# Patient Record
Sex: Male | Born: 1970 | Hispanic: No | Marital: Married | Smoking: Never smoker
Health system: Southern US, Community
[De-identification: ages and names within clinical notes are randomized; demographics above are authoritative.]

## PROBLEM LIST (undated history)

## (undated) DIAGNOSIS — L409 Psoriasis, unspecified: Secondary | ICD-10-CM

## (undated) DIAGNOSIS — E538 Deficiency of other specified B group vitamins: Secondary | ICD-10-CM

## (undated) DIAGNOSIS — E669 Obesity, unspecified: Secondary | ICD-10-CM

## (undated) DIAGNOSIS — E785 Hyperlipidemia, unspecified: Secondary | ICD-10-CM

## (undated) DIAGNOSIS — I428 Other cardiomyopathies: Secondary | ICD-10-CM

## (undated) DIAGNOSIS — R195 Other fecal abnormalities: Secondary | ICD-10-CM

## (undated) DIAGNOSIS — I10 Essential (primary) hypertension: Secondary | ICD-10-CM

## (undated) DIAGNOSIS — D649 Anemia, unspecified: Secondary | ICD-10-CM

## (undated) DIAGNOSIS — M5126 Other intervertebral disc displacement, lumbar region: Secondary | ICD-10-CM

## (undated) DIAGNOSIS — Z789 Other specified health status: Secondary | ICD-10-CM

## (undated) DIAGNOSIS — D696 Thrombocytopenia, unspecified: Secondary | ICD-10-CM

## (undated) DIAGNOSIS — Z9289 Personal history of other medical treatment: Secondary | ICD-10-CM

## (undated) DIAGNOSIS — I953 Hypotension of hemodialysis: Secondary | ICD-10-CM

## (undated) DIAGNOSIS — T8610 Unspecified complication of kidney transplant: Secondary | ICD-10-CM

## (undated) DIAGNOSIS — Z992 Dependence on renal dialysis: Secondary | ICD-10-CM

## (undated) DIAGNOSIS — N183 Chronic kidney disease, stage 3 unspecified: Secondary | ICD-10-CM

## (undated) DIAGNOSIS — N2581 Secondary hyperparathyroidism of renal origin: Secondary | ICD-10-CM

## (undated) DIAGNOSIS — N186 End stage renal disease: Secondary | ICD-10-CM

## (undated) DIAGNOSIS — Z9189 Other specified personal risk factors, not elsewhere classified: Secondary | ICD-10-CM

## (undated) HISTORY — PX: PHOTOCOAGULATION: SHX5303

## (undated) HISTORY — DX: Other intervertebral disc displacement, lumbar region: M51.26

## (undated) HISTORY — DX: Deficiency of other specified B group vitamins: E53.8

## (undated) HISTORY — PX: EYE SURGERY: SHX253

## (undated) HISTORY — DX: Other cardiomyopathies: I42.8

## (undated) HISTORY — DX: Essential (primary) hypertension: I10

## (undated) HISTORY — DX: Other specified personal risk factors, not elsewhere classified: Z91.89

## (undated) HISTORY — DX: Hypomagnesemia: E83.42

## (undated) HISTORY — DX: Other fecal abnormalities: R19.5

## (undated) HISTORY — DX: Hypotension of hemodialysis: I95.3

## (undated) HISTORY — DX: Hypocalcemia: E83.51

## (undated) HISTORY — PX: CATARACT EXTRACTION, BILATERAL: SHX1313

## (undated) HISTORY — DX: Other specified health status: Z78.9

## (undated) HISTORY — DX: Thrombocytopenia, unspecified: D69.6

## (undated) HISTORY — DX: Hyperlipidemia, unspecified: E78.5

## (undated) HISTORY — DX: Chronic kidney disease, stage 3 (moderate): N18.3

## (undated) HISTORY — DX: Secondary hyperparathyroidism of renal origin: N25.81

## (undated) HISTORY — DX: Anemia, unspecified: D64.9

## (undated) HISTORY — DX: Chronic kidney disease, stage 3 unspecified: N18.30

## (undated) HISTORY — PX: AV FISTULA PLACEMENT: SHX1204

## (undated) HISTORY — DX: Dependence on renal dialysis: Z99.2

## (undated) HISTORY — DX: Unspecified complication of kidney transplant: T86.10

## (undated) HISTORY — DX: Obesity, unspecified: E66.9

## (undated) HISTORY — PX: LUMBAR DISC SURGERY: SHX700

## (undated) HISTORY — DX: End stage renal disease: N18.6

---

## 2007-09-27 ENCOUNTER — Emergency Department (HOSPITAL_COMMUNITY): Admission: EM | Admit: 2007-09-27 | Discharge: 2007-09-27 | Payer: Self-pay | Admitting: Emergency Medicine

## 2008-08-19 LAB — HM DIABETES FOOT EXAM

## 2008-12-23 ENCOUNTER — Inpatient Hospital Stay (HOSPITAL_COMMUNITY): Admission: EM | Admit: 2008-12-23 | Discharge: 2009-01-06 | Payer: Self-pay | Admitting: Emergency Medicine

## 2008-12-23 ENCOUNTER — Encounter (INDEPENDENT_AMBULATORY_CARE_PROVIDER_SITE_OTHER): Payer: Self-pay | Admitting: *Deleted

## 2008-12-23 ENCOUNTER — Ambulatory Visit: Payer: Self-pay | Admitting: Cardiology

## 2008-12-24 ENCOUNTER — Ambulatory Visit: Payer: Self-pay | Admitting: Vascular Surgery

## 2008-12-25 ENCOUNTER — Encounter: Payer: Self-pay | Admitting: Internal Medicine

## 2009-01-07 ENCOUNTER — Telehealth: Payer: Self-pay | Admitting: Internal Medicine

## 2009-01-08 ENCOUNTER — Encounter: Payer: Self-pay | Admitting: Internal Medicine

## 2009-01-15 ENCOUNTER — Ambulatory Visit: Payer: Self-pay | Admitting: Internal Medicine

## 2009-01-15 ENCOUNTER — Encounter: Payer: Self-pay | Admitting: Internal Medicine

## 2009-01-15 DIAGNOSIS — R110 Nausea: Secondary | ICD-10-CM | POA: Insufficient documentation

## 2009-01-15 DIAGNOSIS — I10 Essential (primary) hypertension: Secondary | ICD-10-CM

## 2009-01-15 DIAGNOSIS — I420 Dilated cardiomyopathy: Secondary | ICD-10-CM

## 2009-01-15 DIAGNOSIS — N189 Chronic kidney disease, unspecified: Secondary | ICD-10-CM

## 2009-01-15 DIAGNOSIS — N2581 Secondary hyperparathyroidism of renal origin: Secondary | ICD-10-CM | POA: Insufficient documentation

## 2009-01-15 DIAGNOSIS — N186 End stage renal disease: Secondary | ICD-10-CM

## 2009-01-15 DIAGNOSIS — D638 Anemia in other chronic diseases classified elsewhere: Secondary | ICD-10-CM | POA: Insufficient documentation

## 2009-01-15 DIAGNOSIS — D631 Anemia in chronic kidney disease: Secondary | ICD-10-CM

## 2009-01-15 DIAGNOSIS — E119 Type 2 diabetes mellitus without complications: Secondary | ICD-10-CM | POA: Insufficient documentation

## 2009-01-23 ENCOUNTER — Encounter: Payer: Self-pay | Admitting: Internal Medicine

## 2009-01-27 ENCOUNTER — Encounter: Payer: Self-pay | Admitting: Internal Medicine

## 2009-01-27 DIAGNOSIS — R0602 Shortness of breath: Secondary | ICD-10-CM

## 2009-01-28 ENCOUNTER — Encounter (INDEPENDENT_AMBULATORY_CARE_PROVIDER_SITE_OTHER): Payer: Self-pay | Admitting: *Deleted

## 2009-01-30 ENCOUNTER — Ambulatory Visit: Payer: Self-pay | Admitting: Internal Medicine

## 2009-01-30 LAB — CONVERTED CEMR LAB
Blood Glucose, Fingerstick: 179
Hgb A1c MFr Bld: 6.5 %

## 2009-02-04 ENCOUNTER — Ambulatory Visit: Payer: Self-pay | Admitting: Internal Medicine

## 2009-02-04 ENCOUNTER — Ambulatory Visit: Payer: Self-pay | Admitting: Cardiology

## 2009-02-04 LAB — CONVERTED CEMR LAB: Blood Glucose, Home Monitor: 3 mg/dL

## 2009-02-05 ENCOUNTER — Telehealth (INDEPENDENT_AMBULATORY_CARE_PROVIDER_SITE_OTHER): Payer: Self-pay | Admitting: *Deleted

## 2009-03-04 ENCOUNTER — Ambulatory Visit: Payer: Self-pay | Admitting: Internal Medicine

## 2009-03-04 ENCOUNTER — Encounter (INDEPENDENT_AMBULATORY_CARE_PROVIDER_SITE_OTHER): Payer: Self-pay | Admitting: Internal Medicine

## 2009-03-04 LAB — CONVERTED CEMR LAB
Blood Glucose, Fingerstick: 303
HDL: 44 mg/dL (ref >39)
Total CHOL/HDL Ratio: 4.8
VLDL: 61 mg/dL — ABNORMAL HIGH (ref 0–40)

## 2009-03-11 ENCOUNTER — Ambulatory Visit: Payer: Self-pay | Admitting: Vascular Surgery

## 2009-03-18 ENCOUNTER — Encounter: Payer: Self-pay | Admitting: Internal Medicine

## 2009-03-18 LAB — HM DIABETES EYE EXAM

## 2009-03-20 ENCOUNTER — Encounter: Payer: Self-pay | Admitting: Internal Medicine

## 2009-03-20 ENCOUNTER — Telehealth: Payer: Self-pay | Admitting: Internal Medicine

## 2009-03-20 ENCOUNTER — Ambulatory Visit (HOSPITAL_COMMUNITY): Admission: RE | Admit: 2009-03-20 | Discharge: 2009-03-20 | Payer: Self-pay | Admitting: Surgery

## 2009-03-20 ENCOUNTER — Ambulatory Visit: Payer: Self-pay | Admitting: Surgery

## 2009-03-25 ENCOUNTER — Encounter: Payer: Self-pay | Admitting: Internal Medicine

## 2009-03-27 ENCOUNTER — Ambulatory Visit (HOSPITAL_COMMUNITY): Admission: RE | Admit: 2009-03-27 | Discharge: 2009-03-27 | Payer: Self-pay | Admitting: Surgery

## 2009-04-01 ENCOUNTER — Ambulatory Visit: Payer: Self-pay

## 2009-04-01 ENCOUNTER — Encounter: Payer: Self-pay | Admitting: Cardiology

## 2009-04-09 ENCOUNTER — Telehealth (INDEPENDENT_AMBULATORY_CARE_PROVIDER_SITE_OTHER): Payer: Self-pay | Admitting: *Deleted

## 2009-04-14 ENCOUNTER — Ambulatory Visit: Payer: Self-pay | Admitting: Surgery

## 2009-04-22 ENCOUNTER — Ambulatory Visit: Payer: Self-pay | Admitting: Surgery

## 2009-04-22 ENCOUNTER — Ambulatory Visit (HOSPITAL_COMMUNITY): Admission: RE | Admit: 2009-04-22 | Discharge: 2009-04-22 | Payer: Self-pay | Admitting: Surgery

## 2009-04-29 ENCOUNTER — Telehealth (INDEPENDENT_AMBULATORY_CARE_PROVIDER_SITE_OTHER): Payer: Self-pay | Admitting: *Deleted

## 2009-04-29 ENCOUNTER — Encounter (INDEPENDENT_AMBULATORY_CARE_PROVIDER_SITE_OTHER): Payer: Self-pay | Admitting: *Deleted

## 2009-05-15 ENCOUNTER — Encounter: Payer: Self-pay | Admitting: Internal Medicine

## 2009-05-19 ENCOUNTER — Encounter: Payer: Self-pay | Admitting: Internal Medicine

## 2009-05-19 ENCOUNTER — Ambulatory Visit: Payer: Self-pay | Admitting: Internal Medicine

## 2009-05-19 LAB — CONVERTED CEMR LAB: Hgb A1c MFr Bld: 7.2 %

## 2009-06-02 ENCOUNTER — Encounter: Payer: Self-pay | Admitting: Internal Medicine

## 2009-06-05 ENCOUNTER — Ambulatory Visit: Payer: Self-pay | Admitting: Vascular Surgery

## 2009-06-25 ENCOUNTER — Ambulatory Visit: Payer: Self-pay | Admitting: Cardiology

## 2009-06-25 ENCOUNTER — Encounter: Payer: Self-pay | Admitting: Internal Medicine

## 2009-07-14 ENCOUNTER — Encounter: Payer: Self-pay | Admitting: Internal Medicine

## 2009-07-16 ENCOUNTER — Telehealth: Payer: Self-pay | Admitting: Cardiology

## 2009-07-24 ENCOUNTER — Telehealth: Payer: Self-pay | Admitting: Internal Medicine

## 2009-07-24 ENCOUNTER — Telehealth (INDEPENDENT_AMBULATORY_CARE_PROVIDER_SITE_OTHER): Payer: Self-pay | Admitting: *Deleted

## 2009-08-19 ENCOUNTER — Encounter: Payer: Self-pay | Admitting: Internal Medicine

## 2009-08-26 ENCOUNTER — Ambulatory Visit (HOSPITAL_COMMUNITY): Admission: RE | Admit: 2009-08-26 | Discharge: 2009-08-26 | Payer: Self-pay | Admitting: Vascular Surgery

## 2009-08-26 ENCOUNTER — Ambulatory Visit: Payer: Self-pay | Admitting: Surgery

## 2010-02-09 ENCOUNTER — Ambulatory Visit: Payer: Self-pay | Admitting: Cardiology

## 2010-02-10 ENCOUNTER — Ambulatory Visit: Payer: Self-pay | Admitting: Internal Medicine

## 2010-03-12 ENCOUNTER — Ambulatory Visit: Payer: Self-pay | Admitting: Cardiology

## 2010-03-12 ENCOUNTER — Encounter: Payer: Self-pay | Admitting: Cardiology

## 2010-03-12 ENCOUNTER — Ambulatory Visit (HOSPITAL_COMMUNITY): Admission: RE | Admit: 2010-03-12 | Discharge: 2010-03-12 | Payer: Self-pay | Admitting: Cardiology

## 2010-03-12 ENCOUNTER — Ambulatory Visit: Payer: Self-pay

## 2010-04-28 ENCOUNTER — Telehealth: Payer: Self-pay | Admitting: Internal Medicine

## 2010-05-14 ENCOUNTER — Telehealth: Payer: Self-pay | Admitting: Cardiology

## 2010-05-28 ENCOUNTER — Telehealth: Payer: Self-pay | Admitting: Cardiology

## 2010-09-09 ENCOUNTER — Telehealth (INDEPENDENT_AMBULATORY_CARE_PROVIDER_SITE_OTHER): Payer: Self-pay | Admitting: *Deleted

## 2010-09-15 NOTE — Assessment & Plan Note (Signed)
Summary: f91m/dm   History of Present Illness: Pleasant male with newly diagnosed CHF in May of 2010. Echo  revealed an ejection fraction of 20-25%, mild mitral regurgitation, trivial aortic insufficiency and a small pericardial effusion. A cardiac catheterization was performed that revealed normal coronary arteries. A TSH, ferritin, HIV, and ACE level were normal. A rheumatoid factor and ANA were also normal. He was treated medically for nonischemic cardiomyopathy. Note he was also found to have new onset renal failure felt most likely due to hypertension and diabetes. He was ultimately initiated on dialysis. An echocardiogram was repeated on April 01, 2009 and showed an ejection fraction of 40-45%. I last saw him in Nov 2010. Since then he denies any dyspnea on exertion, orthopnea, PND, pedal edema, palpitations, syncope or chest pain.  Current Medications (verified): 1)  Oscal 500/200 D-3 500-200 Mg-Unit Tabs (Calcium-Vitamin D) .Marland Kitchen.. 1 Tab By Mouth Once Daily 2)  Renal Multivitamin/zinc  Tabs (Multiple Vitamin) .... Take 1 Tablet By Mouth Once A Day 3)  Promethazine Hcl 25 Mg Tabs (Promethazine Hcl) .... Take 1 Tablet By Mouth For Nausea During Your Dialysis 4)  Epogen 3000 Unit/ml Soln (Epoetin Alfa) .... 6000 Units Iv With Hd 5)  Aspirin 81 Mg Tbec (Aspirin) .... Take One Tablet By Mouth Daily 6)  Dulcolax Stool Softener 100 Mg Caps (Docusate Sodium) .... As Needed 7)  Lancets  Misc (Lancets) .... To Test Blood Sugar Three Times Daily 8)  Prodigy Blood Glucose Test  Strp (Glucose Blood) .... To Test Blood Sugar Three Times Daily Before Meals 9)  Glipizide 2.5 Mg Xr24h-Tab (Glipizide) .... Take 1 Tablet By Mouth Once A Day 10)  Vitamin C 500 Mg Tabs (Ascorbic Acid) .Marland Kitchen.. 1 Tab By Mouth Once Daily  Allergies: No Known Drug Allergies  Past History:  Past Medical History: Reviewed history from 05/19/2009 and no changes required. Non-ischemic cardiomyopathy      -EF 20-25% 12/23/08 >> EF  40-45% 03/2009      -No CAD per cath 01/02/09      -likely hypertensive CM           -TSH 2.263, RF -, ANA -, HIV -, ACE wnl, Hepatitis panel - End Stage Renal Disease      -Initiated HD via right perm cath 12/30/08 (MWF schedule at [facility] center)      -Left upper extremity A-V fistula by Dr. Oneida Alar 12/30/08      -Anemia:  Recieved Venofer 12/30/08           -Ferritin 179, % sat 12 (12/23/08)      -Secondary Hyperparathyroidism             -PTH 188 (12/23/08), PO4 5.4 (01/06/09), Ca 9.2c (01/06/09) DM 2      -Diagnosed 12/23/08      -A1c 8.3 12/23/08      -24 hour urine protein 2793 mg/day      -SPEP neg HTN      -resolved after initiating hemodialysis      -now with post HD hypotension Hypotension      -secondary to HD, coreg d/c'd  Past Surgical History: Reviewed history from 05/19/2009 and no changes required. Left wrist AVF (12/2008)  >>> L forearm AVF revised (04/2009)  Social History: Reviewed history from 01/27/2009 and no changes required.  Denies tobacco, illicits or alcohol.  Has been married   for 18 years.  Is committed to his marriage.  Has two children.  No   risky sexual behavior.  Review of Systems       no fevers or chills, productive cough, hemoptysis, dysphasia, odynophagia, melena, hematochezia, dysuria, hematuria, rash, seizure activity, orthopnea, PND, pedal edema, claudication. Remaining systems are negative.   Vital Signs:  Patient profile:   40 year old male Height:      65 inches Weight:      158 pounds BMI:     26.39 Pulse rate:   93 / minute Resp:     12 per minute BP sitting:   111 / 70  (left arm)  Vitals Entered By: Burnett Kanaris (February 09, 2010 4:17 PM)  Physical Exam  General:  Well-developed well-nourished in no acute distress.  Skin is warm and dry.  HEENT is normal.  Neck is supple. No thyromegaly.  Chest is clear to auscultation with normal expansion.  Cardiovascular exam is regular rate and rhythm.  Abdominal exam nontender  or distended. No masses palpated. Extremities show no edema. AV fistula left upper extremity. neuro grossly intact    EKG  Procedure date:  02/09/2010  Findings:      Normal sinus rhythm at a rate of 93. No ST changes.  Impression & Recommendations:  Problem # 1:  CARDIOMYOPATHY, DILATED (ICD-425.4) Continue aspirin. He had not been able to add a beta blocker or ACE inhibitor because of blood pressure issues particularly on dialysis. Plan repeat echocardiogram. His reduced LV function previously may have been related to untreated hypertension. His updated medication list for this problem includes:    Aspirin 81 Mg Tbec (Aspirin) .Marland Kitchen... Take one tablet by mouth daily  His updated medication list for this problem includes:    Aspirin 81 Mg Tbec (Aspirin) .Marland Kitchen... Take one tablet by mouth daily  Orders: Echocardiogram (Echo)  Problem # 2:  ESSENTIAL HYPERTENSION, BENIGN (ICD-401.1) Blood pressure now controlled on no medications now that dialysis has been initiated. His updated medication list for this problem includes:    Aspirin 81 Mg Tbec (Aspirin) .Marland Kitchen... Take one tablet by mouth daily  Problem # 3:  END STAGE RENAL DISEASE (ICD-585.6) per nephrology.  Problem # 4:  DIABETES MELLITUS, TYPE II, UNCONTROLLED, WITH COMPLICATIONS (A999333)  His updated medication list for this problem includes:    Aspirin 81 Mg Tbec (Aspirin) .Marland Kitchen... Take one tablet by mouth daily    Glipizide 2.5 Mg Xr24h-tab (Glipizide) .Marland Kitchen... Take 1 tablet by mouth once a day  Patient Instructions: 1)  Your physician recommends that you schedule a follow-up appointment in: ONE YEAR 2)  Your physician has requested that you have an echocardiogram.  Echocardiography is a painless test that uses sound waves to create images of your heart. It provides your doctor with information about the size and shape of your heart and how well your heart's chambers and valves are working.  This procedure takes approximately one  hour. There are no restrictions for this procedure.

## 2010-09-15 NOTE — Progress Notes (Signed)
Summary: re mailed rx  Phone Note Call from Patient   Reason for Call: Talk to Nurse Summary of Call: pt states he never recieved mailed rx for carvedilol-can he get another copy mailed to him? [phone] Initial call taken by: Lorenda Hatchet,  May 14, 2010 1:21 PM  Follow-up for Phone Call        spoke with pt wife, fax number to [facility] [phone]. coreg script faxed. Fredia Beets, RN  May 14, 2010 3:59 PM     Prescriptions: CARVEDILOL 3.125 MG TABS (CARVEDILOL) Take one tablet by mouth twice a day  #90 x 3   Entered by:   Fredia Beets, RN   Authorized by:   Colin Mulders, MD, [facility]   Signed by:   Fredia Beets, RN on 05/14/2010   Method used:   Print then Give to Patient   RxID:   [id number]

## 2010-09-15 NOTE — Miscellaneous (Signed)
Summary: Appointment Canceled  Appointment status changed to canceled by [employer] on 02/26/2010 11:58 AM.  Cancellation Comments --------------------- echo/425.4/echo/mcr/no prec. req/saf  Appointment Information ----------------------- Appt Type:  CARDIOLOGY ANCILLARY VISIT      Date:  Thursday, February 26, 2010      Time:  11:30 AM for 60 min   Urgency:  Routine   Made By:  Wynelle Cleveland  To Visit:  [id number]    Reason:  echo/425.4/echo/mcr/no prec. req/saf  Appt Comments ------------- -- 02/26/10 11:58: ([facility]) CANCELED -- echo/425.4/echo/mcr/no prec. req/saf -- 02/09/10 16:38: ([facility]) BOOKED -- Routine CARDIOLOGY ANCILLARY VISIT at 02/26/2010 11:30 AM for 60 min echo/425.4/echo/mcr/no prec. req/saf  Appended Document: Appointment Canceled    Clinical Lists Changes        Problem # 13:  CARDIOMYOPATHY, DILATED (ICD-425.4) Please refer to last office visit for further details. Pt had Echo ordered by Dr. Stanford Breed to reassess LVF but pt cancelled. Will route and defer to Dr. Stanford Breed to determine when to have it rescheduled for since it is to be done at [facility].  Complete Medication List: 1)  Oscal 500/200 D-3 500-200 Mg-unit Tabs (Calcium-vitamin d) .Marland Kitchen.. 1 tab by mouth once daily 2)  Renal Multivitamin/zinc Tabs (Multiple vitamin) .... Take 1 tablet by mouth once a day 3)  Promethazine Hcl 25 Mg Tabs (Promethazine hcl) .... Take 1 tablet by mouth for nausea during your dialysis 4)  Epogen 3000 Unit/ml Soln (Epoetin alfa) .... 6000 units iv with hd 5)  Aspirin 81 Mg Tbec (Aspirin) .... Take one tablet by mouth daily 6)  Dulcolax Stool Softener 100 Mg Caps (Docusate sodium) .... As needed 7)  Lancets Misc (Lancets) .... To test blood sugar three times daily 8)  Prodigy Blood Glucose Test Strp (Glucose blood) .... To test blood sugar three times daily before meals 9)  Glipizide 2.5 Mg Xr24h-tab (Glipizide) .... Take 1 tablet by mouth once a day 10)   Vitamin C 500 Mg Tabs (Ascorbic acid) .Marland Kitchen.. 1 tab by mouth once daily  Appended Document: Appointment Canceled reschedule echo

## 2010-09-15 NOTE — Progress Notes (Signed)
Summary: refill/ hla  Phone Note Refill Request Message from:  Fax from Pharmacy on April 28, 2010 4:33 PM  Refills Requested: Medication #1:  PRODIGY BLOOD GLUCOSE TEST  STRP to test blood sugar three times daily before meals [BMN]   Dosage confirmed as above?Dosage Confirmed   Last Refilled: 9/2 last visit , lab 02/10/10  Initial call taken by: Freddy Finner RN,  April 28, 2010 4:34 PM  Follow-up for Phone Call       Follow-up by: Larey Dresser MD,  April 28, 2010 4:55 PM    Prescriptions: PRODIGY BLOOD GLUCOSE TEST  STRP (GLUCOSE BLOOD) to test blood sugar three times daily before meals Brand medically necessary #100 x 11   Entered and Authorized by:   Larey Dresser MD   Signed by:   Larey Dresser MD on 04/28/2010   Method used:   Electronically to        [facility].* (retail)       [phone]       [place], [place]  [address]       Ph: [phone]       Fax: [phone]   RxID:   [id number]

## 2010-09-15 NOTE — Assessment & Plan Note (Signed)
Summary: EST-CK/FU/MEDS/CFB   Vital Signs:  Patient profile:   40 year old male Height:      65 inches (165.10 cm) Weight:      159.3 pounds (72.41 kg) BMI:     26.60 Temp:     97.2 degrees F (36.22 degrees C) oral Pulse rate:   84 / minute BP sitting:   112 / 66  (right arm)  Vitals Entered By: Hilda Blades Ditzler RN (February 10, 2010 11:14 AM) Is Patient Diabetic? Yes Did you bring your meter with you today? No Pain Assessment Patient in pain? no      Nutritional Status BMI of 25 - 29 = overweight Nutritional Status Detail appetite good CBG Result 185  Have you ever been in a relationship where you felt threatened, hurt or afraid?denies   Does patient need assistance? Functional Status Self care Ambulation Normal Comments Ck-up and refills on meds.   History of Present Illness: [name] is a 40 yo man with PMH as outlined in chart.  He is here for routine visit.  Doing very well.  Has been more than a few months since we last saw him.  He has no complaints and reports doing great.  Saw Dr. Stanford Breed recently, doing well, repeat echo next week.  HD via LUE AVF MWF per Dr. Mercy Moore.  Depression History:      The patient denies a depressed mood most of the day and a diminished interest in his usual daily activities.         Preventive Screening-Counseling & Management  Alcohol-Tobacco     Alcohol drinks/day: 0     Smoking Status: never  Current Medications (verified): 1)  Oscal 500/200 D-3 500-200 Mg-Unit Tabs (Calcium-Vitamin D) .Marland Kitchen.. 1 Tab By Mouth Once Daily 2)  Renal Multivitamin/zinc  Tabs (Multiple Vitamin) .... Take 1 Tablet By Mouth Once A Day 3)  Promethazine Hcl 25 Mg Tabs (Promethazine Hcl) .... Take 1 Tablet By Mouth For Nausea During Your Dialysis 4)  Epogen 3000 Unit/ml Soln (Epoetin Alfa) .... 6000 Units Iv With Hd 5)  Aspirin 81 Mg Tbec (Aspirin) .... Take One Tablet By Mouth Daily 6)  Dulcolax Stool Softener 100 Mg Caps (Docusate Sodium) .... As Needed 7)   Lancets  Misc (Lancets) .... To Test Blood Sugar Three Times Daily 8)  Prodigy Blood Glucose Test  Strp (Glucose Blood) .... To Test Blood Sugar Three Times Daily Before Meals 9)  Glipizide 2.5 Mg Xr24h-Tab (Glipizide) .... Take 1 Tablet By Mouth Once A Day 10)  Vitamin C 500 Mg Tabs (Ascorbic Acid) .Marland Kitchen.. 1 Tab By Mouth Once Daily  Allergies (verified): No Known Drug Allergies  Past History:  Family History: Last updated: 01/27/2009  Mother has a history of hypertension.  Father had   history of diabetes mellitus and end stage renal disease, died at the   age of 62.  His siblings have hypertension and diabetes.  No history of   sudden cardiac death or congestive heart failure or coronary artery   disease.      Social History: Last updated: 02/10/2010  Denies tobacco, illicits or alcohol.  Has been married   for 18 years.  Is committed to his marriage.  Has two children.  No   risky sexual behavior.   Trying to get back to school and wants to start working again.     Risk Factors: Smoking Status: never (02/10/2010)  Past Medical History: Non-ischemic cardiomyopathy      -EF 20-25% 12/23/08 >>  EF 40-45% 03/2009 >> EF ??? 7/?/11      -No CAD per cath 01/02/09      -likely hypertensive CM           -TSH 2.263, RF -, ANA -, HIV -, ACE wnl, Hepatitis panel - End Stage Renal Disease      -Initiated HD via right perm cath 12/30/08 (MWF schedule at [facility] center)      -Left upper extremity A-V fistula by Dr. Oneida Alar 12/30/08      -Anemia:  Recieved Venofer 12/30/08           -Ferritin 179, % sat 12 (12/23/08)      -Secondary Hyperparathyroidism             -PTH 188 (12/23/08), PO4 5.4 (01/06/09), Ca 9.2c (01/06/09) DM 2      -Diagnosed 12/23/08      -A1c 8.3 12/23/08      -24 hour urine protein 2793 mg/day      -SPEP neg      -proliferative BDR s/p photocoagulation 08/2009 done at [facility] and followed by [facility]. HTN      -resolved after initiating hemodialysis      -now with  post HD hypotension Hypotension      -secondary to HD, coreg d/c'd  Past Surgical History: Left wrist AVF (12/2008)  >>> L forearm AVF revised (04/2009) Photocoagulation for diabetic retinopathy 08/2009 Bilateral cataract removal 08/2009  Social History:  Denies tobacco, illicits or alcohol.  Has been married   for 18 years.  Is committed to his marriage.  Has two children.  No   risky sexual behavior.   Trying to get back to school and wants to start working again.     Review of Systems      See HPI  Physical Exam  General:  alert, well-developed, normal appearance, and cooperative to examination.   Eyes:  anicteric, s/p bilateral lens implantation.   Neck:  supple and no JVD.   Lungs:  normal respiratory effort, no accessory muscle use, normal breath sounds, no crackles, and no wheezes.   Heart:  normal rate, regular rhythm, no murmur, no gallop, no rub, and no JVD.   Abdomen:  normal bowel sounds.   Extremities:  no edema Neurologic:  alert & oriented X3, cranial nerves II-XII intact, strength normal in all extremities, and gait normal.   Psych:  Oriented X3, memory intact for recent and remote, and normally interactive.     Impression & Recommendations:  Problem # 1:  DIABETES MELLITUS, TYPE II, UNCONTROLLED, WITH COMPLICATIONS (A999333) A1c 6.9 c/w current regimen Doing quite well  His updated medication list for this problem includes:    Aspirin 81 Mg Tbec (Aspirin) .Marland Kitchen... Take one tablet by mouth daily    Glipizide 2.5 Mg Xr24h-tab (Glipizide) .Marland Kitchen... Take 1 tablet by mouth once a day  Orders: T- Capillary Blood Glucose [id number]) T-Hgb A1C (in-house) [id number]) T-Lipid Profile [id number])  Problem # 2:  DIABETIC  RETINOPATHY (ICD-250.50)  followed by Dr. Katy Fitch s/p bilateral cataract removal and photocoagulation for proliferative BDR at [facility]  His updated medication list for this problem includes:    Aspirin 81 Mg Tbec (Aspirin) .Marland Kitchen... Take one tablet by mouth  daily    Glipizide 2.5 Mg Xr24h-tab (Glipizide) .Marland Kitchen... Take 1 tablet by mouth once a day  Last Eye Exam: Proliferative diabetic retinopathy.    (04/04/2009) Reviewed HgBA1c results: 6.9 (02/10/2010)  7.2 (05/19/2009)  Problem # 3:  ESSENTIAL HYPERTENSION, BENIGN (  ICD-401.1) Well controlled without meds  BP today: 112/66 Prior BP: 111/70 (02/09/2010)  Labs Reviewed: Chol: 209 (03/04/2009)   HDL: 44 (03/04/2009)   LDL: 104 (03/04/2009)   TG: 306 (03/04/2009)  Problem # 4:  END STAGE RENAL DISEASE (ICD-585.6) Per Dr. Mercy Moore HD MWF via LUE AVF  Problem # 5:  SECONDARY HYPERPARATHYROIDISM (ICD-588.81) per Dr. Mercy Moore  Problem # 6:  ANEMIA OF RENAL FAILURE (ICD-285.21) per Dr. Mercy Moore  His updated medication list for this problem includes:    Epogen 3000 Unit/ml Soln (Epoetin alfa) .Marland KitchenMarland KitchenMarland KitchenMarland Kitchen 6000 units iv with hd  Problem # 7:  Preventive Health Care (ICD-V70.0) Flu vaccine in Fall Will also provide TdaP if not given previously at that time. Pneumovax 12/23/08 in hospital  Problem # 8:  CARDIOMYOPATHY, DILATED (ICD-425.4) Recently saw Dr. Stanford Breed Doing quite well Currently euvolemic and well perfused Prior echo showed improved EF 40-45% from 20%.   New echo next week Does not tolerate B-blocker or ACE-I due to hypotension with HD  Complete Medication List: 1)  Oscal 500/200 D-3 500-200 Mg-unit Tabs (Calcium-vitamin d) .Marland Kitchen.. 1 tab by mouth once daily 2)  Renal Multivitamin/zinc Tabs (Multiple vitamin) .... Take 1 tablet by mouth once a day 3)  Promethazine Hcl 25 Mg Tabs (Promethazine hcl) .... Take 1 tablet by mouth for nausea during your dialysis 4)  Epogen 3000 Unit/ml Soln (Epoetin alfa) .... 6000 units iv with hd 5)  Aspirin 81 Mg Tbec (Aspirin) .... Take one tablet by mouth daily 6)  Dulcolax Stool Softener 100 Mg Caps (Docusate sodium) .... As needed 7)  Lancets Misc (Lancets) .... To test blood sugar three times daily 8)  Prodigy Blood Glucose Test Strp (Glucose  blood) .... To test blood sugar three times daily before meals 9)  Glipizide 2.5 Mg Xr24h-tab (Glipizide) .... Take 1 tablet by mouth once a day 10)  Vitamin C 500 Mg Tabs (Ascorbic acid) .Marland Kitchen.. 1 tab by mouth once daily  Patient Instructions: 1)  Please schedule a follow-up appointment in 6 months. 2)  Doing great overall. 3)  Continue with your medications listed below. 4)  If you have any problems before your next appointment, call clinic. Prescriptions: GLIPIZIDE 2.5 MG XR24H-TAB (GLIPIZIDE) Take 1 tablet by mouth once a day  #30 x 6   Entered and Authorized by:   Alphia Moh MD   Signed by:   Alphia Moh MD on 02/10/2010   Method used:   Electronically to        [facility] (retail)       [phone]       [place], [place]  [address]       Ph: [phone]       Fax: [phone]   RxID:   [id number] GLIPIZIDE 2.5 MG XR24H-TAB (GLIPIZIDE) Take 1 tablet by mouth once a day  #30 x 0   Entered and Authorized by:   Alphia Moh MD   Signed by:   Alphia Moh MD on 02/10/2010   Method used:   Electronically to        [facility] (retail)       [phone]       [place], [place]  [address]       Ph: [phone]       Fax: [phone]   RxID:   [id number]  Process Orders Check Orders Results:  [facility]: Check successful Tests Sent for requisitioning (February 10, 2010 11:51 AM):     02/10/2010: [facility] -- T-Lipid Profile [phone] (signed)    Prevention & Chronic Care Immunizations   Influenza vaccine: Not documented   Influenza vaccine deferral: Not available  (02/10/2010)    Tetanus booster: Not documented   Td booster deferral: Deferred  (02/10/2010)    Pneumococcal vaccine: Not documented  Other Screening   Smoking status: never  (02/10/2010)  Diabetes Mellitus   HgbA1C: 6.9  (02/10/2010)    Eye  exam: Proliferative diabetic retinopathy.     (04/04/2009)    Foot exam: yes  (05/19/2009)   Foot exam action/deferral: Do today   High risk foot: Not documented   Foot care education: Not documented    Urine microalbumin/creatinine ratio: Not documented   Urine microalbumin action/deferral: Not indicated    Diabetes flowsheet reviewed?: Yes   Progress toward A1C goal: Improved  Lipids   Total Cholesterol: 209  (03/04/2009)   Lipid panel action/deferral: Lipid Panel ordered   LDL: 104  (03/04/2009)   LDL Direct: Not documented   HDL: 44  (03/04/2009)   Triglycerides: 306  (03/04/2009)  Hypertension   Last Blood Pressure: 112 / 66  (02/10/2010)   Serum creatinine: Not documented   Serum potassium Not documented    Hypertension flowsheet reviewed?: Yes   Progress toward BP goal: At goal  Self-Management Support :   Personal Goals (by the next clinic visit) :     Personal A1C goal: 6  (02/10/2010)     Personal blood pressure goal: 130/80  (02/10/2010)     Personal LDL goal: 100  (02/10/2010)    Patient will work on the following items until the next clinic visit to reach self-care goals:     Medications and monitoring: take my medicines every day, check my blood sugar, bring all of my medications to every visit, examine my feet every day  (02/10/2010)     Eating: drink diet soda or water instead of juice or soda, eat more vegetables, use fresh or frozen vegetables, eat foods that are low in salt, eat baked foods instead of fried foods, eat fruit for snacks and desserts, limit or avoid alcohol  (02/10/2010)     Activity: take a 30 minute walk every day  (02/10/2010)    Diabetes self-management support: Written self-care plan, Education handout, Resources for patients handout  (02/10/2010)   Diabetes care plan printed   Diabetes education handout printed    Hypertension self-management support: Written self-care plan, Education handout, Resources for patients handout   (02/10/2010)   Hypertension self-care plan printed.   Hypertension education handout printed      Resource handout printed.  Laboratory Results   Blood Tests   Date/Time Received: February 10, 2010 11:42 AM  Date/Time Reported: Lenoria Farrier  February 10, 2010 11:42 AM   HGBA1C: 6.9%   (Normal Range: Non-Diabetic - 3-6%   Control Diabetic - 6-8%) CBG Random:: 185mg /dL

## 2010-09-15 NOTE — Progress Notes (Signed)
Summary: question about medication  Phone Note Call from Patient Call back at Home Phone [phone] Call back at [phone]   Caller: Patient Summary of Call: question about medication Initial call taken by: Delsa Sale,  May 28, 2010 11:46 AM  Follow-up for Phone Call        left msg at [phone] for c/b Follow-up by: Joan Flores RN,  May 28, 2010 12:38 PM  Additional Follow-up for Phone Call Additional follow up Details #1::         PT WAS [place].PER PT RECIEVED IN MAIL YESTERDAY NOTHING FURTHER NEEDED AT THIS TIME. Additional Follow-up by: Devra Dopp, LPN,  October 18, 624THL 11:20 AM

## 2010-09-15 NOTE — Consult Note (Signed)
Summary: [facility]: Post Cataract  [facility]: Post Cataract   Imported By: Bonner Puna 09/17/2009 16:42:33  _____________________________________________________________________  External Attachment:    Type:   Image     Comment:   External Document  Appended Document: [facility]: Post Cataract s/p bilateral cataract surgery, doing well.

## 2010-09-17 NOTE — Progress Notes (Signed)
  Request received from [facility] sent to [facility]  September 09, 2010 8:31 AM

## 2010-09-18 DIAGNOSIS — N186 End stage renal disease: Secondary | ICD-10-CM

## 2010-10-14 ENCOUNTER — Encounter: Payer: Self-pay | Admitting: Internal Medicine

## 2010-10-20 ENCOUNTER — Ambulatory Visit (INDEPENDENT_AMBULATORY_CARE_PROVIDER_SITE_OTHER): Payer: Self-pay | Admitting: Internal Medicine

## 2010-10-20 ENCOUNTER — Ambulatory Visit: Payer: Self-pay | Admitting: Internal Medicine

## 2010-10-20 ENCOUNTER — Encounter: Payer: Self-pay | Admitting: Internal Medicine

## 2010-10-20 DIAGNOSIS — I10 Essential (primary) hypertension: Secondary | ICD-10-CM

## 2010-10-20 DIAGNOSIS — N186 End stage renal disease: Secondary | ICD-10-CM

## 2010-10-20 DIAGNOSIS — E119 Type 2 diabetes mellitus without complications: Secondary | ICD-10-CM

## 2010-10-20 DIAGNOSIS — E118 Type 2 diabetes mellitus with unspecified complications: Secondary | ICD-10-CM

## 2010-10-20 LAB — POCT GLYCOSYLATED HEMOGLOBIN (HGB A1C): Hemoglobin A1C: 7.5

## 2010-10-20 NOTE — Assessment & Plan Note (Signed)
ESRD on HD and has been f/u in [facility] for his labs and his HgB usually 10.

## 2010-10-20 NOTE — Assessment & Plan Note (Signed)
CBG well controlled and usually runs about 120s, never more than 200. Today's A1C 7.5. Will continue current glipizide and may increase if A1C up at next visit. Had eye exam about 3 months ago. Done foot exam today, normal.

## 2010-10-20 NOTE — Patient Instructions (Signed)
Please continue all your medications as instructed and keep your blood sugar at least below 200.

## 2010-10-20 NOTE — Assessment & Plan Note (Signed)
BP well controlled with coreg. Continue current meds.

## 2010-10-20 NOTE — Progress Notes (Signed)
  Subjective:    Patient ID: [name], male    DOB: 1971-05-02, 40 y.o.   MRN: [id number]  HPIPt is a 40 yo male with PMH of DM, ESRD on HD, NICM and HTN who came here for check A1C. His last A1C 6.9 on 01/2010 and has been on glipizide and his CBG usually runs 120s, never above 200 recently. He has no other c/o, including fever, SOB, or edema.     Review of Systems  Constitutional: Negative for fever, chills and activity change.  HENT: Negative for hearing loss, ear pain, congestion, facial swelling, sneezing and neck stiffness.   Eyes: Negative for redness and itching.  Respiratory: Negative for apnea, cough and wheezing.   Cardiovascular: Negative for chest pain, palpitations and leg swelling.  Gastrointestinal: Negative for nausea, vomiting, abdominal pain, diarrhea and abdominal distention.  Genitourinary: Negative for dysuria and urgency.  Neurological: Negative for dizziness and headaches.       Objective:   Physical Exam  Constitutional: He is oriented to person, place, and time. He appears well-developed and well-nourished.  HENT:  Head: Normocephalic and atraumatic.  Eyes: Conjunctivae and EOM are normal. Pupils are equal, round, and reactive to light.  Neck: Normal range of motion. Neck supple.  Cardiovascular: Normal rate, regular rhythm, normal heart sounds and intact distal pulses.   Pulmonary/Chest: Effort normal and breath sounds normal. He has no wheezes. He exhibits no tenderness.  Abdominal: Soft. Bowel sounds are normal. He exhibits no mass. There is no tenderness. There is no rebound.  Musculoskeletal: Normal range of motion.       Left arm AVF has thrill. No infection signs.   Neurological: He is alert and oriented to person, place, and time. He has normal reflexes.  Skin: No rash noted. No erythema. No pallor.          Assessment & Plan:

## 2010-11-03 ENCOUNTER — Other Ambulatory Visit (HOSPITAL_COMMUNITY): Payer: Self-pay | Admitting: Nephrology

## 2010-11-03 DIAGNOSIS — N186 End stage renal disease: Secondary | ICD-10-CM

## 2010-11-12 ENCOUNTER — Ambulatory Visit (HOSPITAL_COMMUNITY)
Admission: RE | Admit: 2010-11-12 | Discharge: 2010-11-12 | Disposition: A | Discharge status: Home / Self Care | Payer: Medicare Other | Source: Ambulatory Visit | Attending: Nephrology | Admitting: Nephrology

## 2010-11-12 ENCOUNTER — Other Ambulatory Visit (HOSPITAL_COMMUNITY): Payer: Self-pay | Admitting: Nephrology

## 2010-11-12 DIAGNOSIS — N186 End stage renal disease: Secondary | ICD-10-CM | POA: Insufficient documentation

## 2010-11-12 DIAGNOSIS — Y832 Surgical operation with anastomosis, bypass or graft as the cause of abnormal reaction of the patient, or of later complication, without mention of misadventure at the time of the procedure: Secondary | ICD-10-CM | POA: Insufficient documentation

## 2010-11-12 DIAGNOSIS — E119 Type 2 diabetes mellitus without complications: Secondary | ICD-10-CM | POA: Insufficient documentation

## 2010-11-12 DIAGNOSIS — I871 Compression of vein: Secondary | ICD-10-CM | POA: Insufficient documentation

## 2010-11-12 DIAGNOSIS — T82898A Other specified complication of vascular prosthetic devices, implants and grafts, initial encounter: Secondary | ICD-10-CM | POA: Insufficient documentation

## 2010-11-12 MED ORDER — IOHEXOL 300 MG/ML  SOLN
100.0000 mL | Freq: Once | INTRAMUSCULAR | Status: AC | PRN
  Administered 2010-11-12: 71 mL via INTRAVENOUS

## 2010-11-19 LAB — GLUCOSE, CAPILLARY: Glucose-Capillary: 367 mg/dL — ABNORMAL HIGH (ref 70–99)

## 2010-11-20 LAB — POCT I-STAT 4, (NA,K, GLUC, HGB,HCT)
HCT: 49 % (ref 39.0–52.0)
Sodium: 137 mEq/L (ref 135–145)

## 2010-11-20 LAB — GLUCOSE, CAPILLARY: Glucose-Capillary: 94 mg/dL (ref 70–99)

## 2010-11-21 LAB — POCT I-STAT, CHEM 8
Creatinine, Ser: 8.4 mg/dL — ABNORMAL HIGH (ref 0.4–1.5)
HCT: 44 % (ref 39.0–52.0)
Hemoglobin: 15 g/dL (ref 13.0–17.0)
Potassium: 3.5 mEq/L (ref 3.5–5.1)
Sodium: 138 mEq/L (ref 135–145)

## 2010-11-21 LAB — POCT I-STAT 4, (NA,K, GLUC, HGB,HCT)
Potassium: 4.2 mEq/L (ref 3.5–5.1)
Sodium: 138 mEq/L (ref 135–145)

## 2010-11-21 LAB — GLUCOSE, CAPILLARY: Glucose-Capillary: 149 mg/dL — ABNORMAL HIGH (ref 70–99)

## 2010-11-22 LAB — GLUCOSE, CAPILLARY: Glucose-Capillary: 303 mg/dL — ABNORMAL HIGH (ref 70–99)

## 2010-11-24 LAB — CBC
HCT: 30.1 % — ABNORMAL LOW (ref 39.0–52.0)
HCT: 31.5 % — ABNORMAL LOW (ref 39.0–52.0)
HCT: 35.2 % — ABNORMAL LOW (ref 39.0–52.0)
HCT: 35.3 % — ABNORMAL LOW (ref 39.0–52.0)
HCT: 36.4 % — ABNORMAL LOW (ref 39.0–52.0)
HCT: 31.1 % — ABNORMAL LOW (ref 39.0–52.0)
HCT: 31.5 % — ABNORMAL LOW (ref 39.0–52.0)
HCT: 37.8 % — ABNORMAL LOW (ref 39.0–52.0)
Hemoglobin: 10.6 g/dL — ABNORMAL LOW (ref 13.0–17.0)
Hemoglobin: 11 g/dL — ABNORMAL LOW (ref 13.0–17.0)
Hemoglobin: 11.7 g/dL — ABNORMAL LOW (ref 13.0–17.0)
Hemoglobin: 11.8 g/dL — ABNORMAL LOW (ref 13.0–17.0)
Hemoglobin: 12.2 g/dL — ABNORMAL LOW (ref 13.0–17.0)
Hemoglobin: 10.5 g/dL — ABNORMAL LOW (ref 13.0–17.0)
Hemoglobin: 10.6 g/dL — ABNORMAL LOW (ref 13.0–17.0)
Hemoglobin: 10.6 g/dL — ABNORMAL LOW (ref 13.0–17.0)
MCHC: 33.6 g/dL (ref 30.0–36.0)
MCHC: 34.4 g/dL (ref 30.0–36.0)
MCHC: 35 g/dL (ref 30.0–36.0)
MCHC: 33.4 g/dL (ref 30.0–36.0)
MCHC: 33.7 g/dL (ref 30.0–36.0)
MCHC: 34 g/dL (ref 30.0–36.0)
MCHC: 34 g/dL (ref 30.0–36.0)
MCV: 82.7 fL (ref 78.0–100.0)
MCV: 82.7 fL (ref 78.0–100.0)
MCV: 83.3 fL (ref 78.0–100.0)
MCV: 83.5 fL (ref 78.0–100.0)
MCV: 83.5 fL (ref 78.0–100.0)
MCV: 82.7 fL (ref 78.0–100.0)
MCV: 82.8 fL (ref 78.0–100.0)
MCV: 82.9 fL (ref 78.0–100.0)
MCV: 82.9 fL (ref 78.0–100.0)
Platelets: 105 10*3/uL — ABNORMAL LOW (ref 150–400)
Platelets: 91 10*3/uL — ABNORMAL LOW (ref 150–400)
Platelets: 96 10*3/uL — ABNORMAL LOW (ref 150–400)
Platelets: 99 10*3/uL — ABNORMAL LOW (ref 150–400)
Platelets: 72 10*3/uL — ABNORMAL LOW (ref 150–400)
Platelets: 87 10*3/uL — ABNORMAL LOW (ref 150–400)
RBC: 3.73 MIL/uL — ABNORMAL LOW (ref 4.22–5.81)
RBC: 3.81 MIL/uL — ABNORMAL LOW (ref 4.22–5.81)
RBC: 4.21 MIL/uL — ABNORMAL LOW (ref 4.22–5.81)
RBC: 3.75 MIL/uL — ABNORMAL LOW (ref 4.22–5.81)
RBC: 3.76 MIL/uL — ABNORMAL LOW (ref 4.22–5.81)
RDW: 14.3 % (ref 11.5–15.5)
RDW: 14.3 % (ref 11.5–15.5)
RDW: 14.4 % (ref 11.5–15.5)
RDW: 14 % (ref 11.5–15.5)
RDW: 14.2 % (ref 11.5–15.5)
RDW: 14.2 % (ref 11.5–15.5)
WBC: 10 10*3/uL (ref 4.0–10.5)
WBC: 10.9 10*3/uL — ABNORMAL HIGH (ref 4.0–10.5)
WBC: 15.8 10*3/uL — ABNORMAL HIGH (ref 4.0–10.5)
WBC: 6.1 10*3/uL (ref 4.0–10.5)
WBC: 7.6 10*3/uL (ref 4.0–10.5)

## 2010-11-24 LAB — PROTIME-INR
INR: 1 (ref 0.00–1.49)
Prothrombin Time: 13.9 seconds (ref 11.6–15.2)

## 2010-11-24 LAB — BASIC METABOLIC PANEL
BUN: 52 mg/dL — ABNORMAL HIGH (ref 6–23)
BUN: 53 mg/dL — ABNORMAL HIGH (ref 6–23)
CO2: 26 mEq/L (ref 19–32)
CO2: 30 mEq/L (ref 19–32)
Calcium: 8.8 mg/dL (ref 8.4–10.5)
Calcium: 6.2 mg/dL — CL (ref 8.4–10.5)
Chloride: 96 mEq/L (ref 96–112)
Chloride: 115 mEq/L — ABNORMAL HIGH (ref 96–112)
Creatinine, Ser: 5.99 mg/dL — ABNORMAL HIGH (ref 0.4–1.5)
Creatinine, Ser: 6.92 mg/dL — ABNORMAL HIGH (ref 0.4–1.5)
GFR calc Af Amer: 11 mL/min — ABNORMAL LOW (ref >60)
GFR calc Af Amer: 13 mL/min — ABNORMAL LOW (ref >60)
GFR calc non Af Amer: 9 mL/min — ABNORMAL LOW (ref >60)
Glucose, Bld: 291 mg/dL — ABNORMAL HIGH (ref 70–99)
Glucose, Bld: 204 mg/dL — ABNORMAL HIGH (ref 70–99)
Glucose, Bld: 206 mg/dL — ABNORMAL HIGH (ref 70–99)
Potassium: 3.7 mEq/L (ref 3.5–5.1)
Sodium: 135 mEq/L (ref 135–145)
Sodium: 142 mEq/L (ref 135–145)

## 2010-11-24 LAB — EPSTEIN-BARR VIRUS VCA, IGM: EBV VCA IgM: 0.03 {ISR}

## 2010-11-24 LAB — GLUCOSE, CAPILLARY
Glucose-Capillary: 101 mg/dL — ABNORMAL HIGH (ref 70–99)
Glucose-Capillary: 106 mg/dL — ABNORMAL HIGH (ref 70–99)
Glucose-Capillary: 113 mg/dL — ABNORMAL HIGH (ref 70–99)
Glucose-Capillary: 123 mg/dL — ABNORMAL HIGH (ref 70–99)
Glucose-Capillary: 158 mg/dL — ABNORMAL HIGH (ref 70–99)
Glucose-Capillary: 159 mg/dL — ABNORMAL HIGH (ref 70–99)
Glucose-Capillary: 200 mg/dL — ABNORMAL HIGH (ref 70–99)
Glucose-Capillary: 206 mg/dL — ABNORMAL HIGH (ref 70–99)
Glucose-Capillary: 218 mg/dL — ABNORMAL HIGH (ref 70–99)
Glucose-Capillary: 257 mg/dL — ABNORMAL HIGH (ref 70–99)
Glucose-Capillary: 60 mg/dL — ABNORMAL LOW (ref 70–99)
Glucose-Capillary: 72 mg/dL (ref 70–99)
Glucose-Capillary: 74 mg/dL (ref 70–99)
Glucose-Capillary: 83 mg/dL (ref 70–99)
Glucose-Capillary: 86 mg/dL (ref 70–99)
Glucose-Capillary: 88 mg/dL (ref 70–99)
Glucose-Capillary: 92 mg/dL (ref 70–99)
Glucose-Capillary: 109 mg/dL — ABNORMAL HIGH (ref 70–99)
Glucose-Capillary: 121 mg/dL — ABNORMAL HIGH (ref 70–99)
Glucose-Capillary: 140 mg/dL — ABNORMAL HIGH (ref 70–99)
Glucose-Capillary: 144 mg/dL — ABNORMAL HIGH (ref 70–99)
Glucose-Capillary: 148 mg/dL — ABNORMAL HIGH (ref 70–99)
Glucose-Capillary: 153 mg/dL — ABNORMAL HIGH (ref 70–99)
Glucose-Capillary: 159 mg/dL — ABNORMAL HIGH (ref 70–99)
Glucose-Capillary: 159 mg/dL — ABNORMAL HIGH (ref 70–99)
Glucose-Capillary: 210 mg/dL — ABNORMAL HIGH (ref 70–99)
Glucose-Capillary: 82 mg/dL (ref 70–99)

## 2010-11-24 LAB — RENAL FUNCTION PANEL
Albumin: 2.9 g/dL — ABNORMAL LOW (ref 3.5–5.2)
Albumin: 3.1 g/dL — ABNORMAL LOW (ref 3.5–5.2)
Albumin: 3.2 g/dL — ABNORMAL LOW (ref 3.5–5.2)
Albumin: 2.7 g/dL — ABNORMAL LOW (ref 3.5–5.2)
Albumin: 2.7 g/dL — ABNORMAL LOW (ref 3.5–5.2)
BUN: 43 mg/dL — ABNORMAL HIGH (ref 6–23)
BUN: 56 mg/dL — ABNORMAL HIGH (ref 6–23)
BUN: 69 mg/dL — ABNORMAL HIGH (ref 6–23)
BUN: 69 mg/dL — ABNORMAL HIGH (ref 6–23)
BUN: 73 mg/dL — ABNORMAL HIGH (ref 6–23)
BUN: 65 mg/dL — ABNORMAL HIGH (ref 6–23)
BUN: 68 mg/dL — ABNORMAL HIGH (ref 6–23)
CO2: 21 mEq/L (ref 19–32)
CO2: 21 mEq/L (ref 19–32)
CO2: 22 mEq/L (ref 19–32)
CO2: 25 mEq/L (ref 19–32)
CO2: 19 mEq/L (ref 19–32)
CO2: 19 mEq/L (ref 19–32)
CO2: 19 mEq/L (ref 19–32)
Calcium: 7.2 mg/dL — ABNORMAL LOW (ref 8.4–10.5)
Calcium: 7.5 mg/dL — ABNORMAL LOW (ref 8.4–10.5)
Calcium: 8.4 mg/dL (ref 8.4–10.5)
Calcium: 8.5 mg/dL (ref 8.4–10.5)
Calcium: 6.1 mg/dL — CL (ref 8.4–10.5)
Chloride: 104 mEq/L (ref 96–112)
Chloride: 105 mEq/L (ref 96–112)
Chloride: 99 mEq/L (ref 96–112)
Chloride: 104 mEq/L (ref 96–112)
Chloride: 107 mEq/L (ref 96–112)
Chloride: 107 mEq/L (ref 96–112)
Creatinine, Ser: 10.48 mg/dL — ABNORMAL HIGH (ref 0.4–1.5)
Creatinine, Ser: 10.62 mg/dL — ABNORMAL HIGH (ref 0.4–1.5)
Creatinine, Ser: 9.03 mg/dL — ABNORMAL HIGH (ref 0.4–1.5)
Creatinine, Ser: 9.02 mg/dL — ABNORMAL HIGH (ref 0.4–1.5)
Creatinine, Ser: 9.13 mg/dL — ABNORMAL HIGH (ref 0.4–1.5)
GFR calc Af Amer: 7 mL/min — ABNORMAL LOW (ref >60)
GFR calc Af Amer: 8 mL/min — ABNORMAL LOW (ref >60)
GFR calc Af Amer: 8 mL/min — ABNORMAL LOW (ref >60)
GFR calc Af Amer: 8 mL/min — ABNORMAL LOW (ref >60)
GFR calc non Af Amer: 6 mL/min — ABNORMAL LOW (ref >60)
GFR calc non Af Amer: 7 mL/min — ABNORMAL LOW (ref >60)
GFR calc non Af Amer: 7 mL/min — ABNORMAL LOW (ref >60)
GFR calc non Af Amer: 7 mL/min — ABNORMAL LOW (ref >60)
Glucose, Bld: 104 mg/dL — ABNORMAL HIGH (ref 70–99)
Glucose, Bld: 70 mg/dL (ref 70–99)
Glucose, Bld: 99 mg/dL (ref 70–99)
Glucose, Bld: 96 mg/dL (ref 70–99)
Phosphorus: 5.4 mg/dL — ABNORMAL HIGH (ref 2.3–4.6)
Phosphorus: 7.5 mg/dL — ABNORMAL HIGH (ref 2.3–4.6)
Phosphorus: 7.8 mg/dL — ABNORMAL HIGH (ref 2.3–4.6)
Phosphorus: 7 mg/dL — ABNORMAL HIGH (ref 2.3–4.6)
Potassium: 4.1 mEq/L (ref 3.5–5.1)
Potassium: 4.1 mEq/L (ref 3.5–5.1)
Potassium: 4.4 mEq/L (ref 3.5–5.1)
Potassium: 3.7 mEq/L (ref 3.5–5.1)
Sodium: 135 mEq/L (ref 135–145)
Sodium: 138 mEq/L (ref 135–145)
Sodium: 143 mEq/L (ref 135–145)
Sodium: 139 mEq/L (ref 135–145)

## 2010-11-24 LAB — HEMOGLOBINOPATHY EVALUATION: Hgb F Quant: 0 % (ref 0.0–2.0)

## 2010-11-24 LAB — URINALYSIS, ROUTINE W REFLEX MICROSCOPIC
Glucose, UA: NEGATIVE mg/dL
Leukocytes, UA: NEGATIVE
Specific Gravity, Urine: 1.015 (ref 1.005–1.030)

## 2010-11-24 LAB — IRON AND TIBC
Iron: 33 ug/dL — ABNORMAL LOW (ref 42–135)
Saturation Ratios: 12 % — ABNORMAL LOW (ref 20–55)
TIBC: 275 ug/dL (ref 215–435)

## 2010-11-24 LAB — DRUGS OF ABUSE SCREEN W/O ALC, ROUTINE URINE
Amphetamine Screen, Ur: NEGATIVE
Benzodiazepines.: NEGATIVE
Opiate Screen, Urine: NEGATIVE
Phencyclidine (PCP): NEGATIVE
Propoxyphene: NEGATIVE

## 2010-11-24 LAB — URINE MICROSCOPIC-ADD ON

## 2010-11-24 LAB — HEPATITIS PANEL, ACUTE
HCV Ab: NEGATIVE
Hep A IgM: NEGATIVE
Hep B C IgM: NEGATIVE

## 2010-11-24 LAB — COMPREHENSIVE METABOLIC PANEL
ALT: 18 U/L (ref 0–53)
ALT: 27 U/L (ref 0–53)
AST: 13 U/L (ref 0–37)
AST: 15 U/L (ref 0–37)
CO2: 17 mEq/L — ABNORMAL LOW (ref 19–32)
CO2: 18 mEq/L — ABNORMAL LOW (ref 19–32)
Calcium: 5.9 mg/dL — CL (ref 8.4–10.5)
Chloride: 112 mEq/L (ref 96–112)
Chloride: 115 mEq/L — ABNORMAL HIGH (ref 96–112)
GFR calc Af Amer: 10 mL/min — ABNORMAL LOW (ref >60)
GFR calc Af Amer: 12 mL/min — ABNORMAL LOW (ref >60)
GFR calc non Af Amer: 10 mL/min — ABNORMAL LOW (ref >60)
GFR calc non Af Amer: 8 mL/min — ABNORMAL LOW (ref >60)
Glucose, Bld: 114 mg/dL — ABNORMAL HIGH (ref 70–99)
Sodium: 139 mEq/L (ref 135–145)
Sodium: 140 mEq/L (ref 135–145)
Total Bilirubin: 0.5 mg/dL (ref 0.3–1.2)
Total Bilirubin: 0.6 mg/dL (ref 0.3–1.2)

## 2010-11-24 LAB — TROPONIN I
Troponin I: 0.03 ng/mL (ref 0.00–0.06)
Troponin I: 0.03 ng/mL (ref 0.00–0.06)

## 2010-11-24 LAB — CALCIUM, IONIZED: Calcium, Ion: 0.83 mmol/L — ABNORMAL LOW (ref 1.12–1.32)

## 2010-11-24 LAB — POCT I-STAT 4, (NA,K, GLUC, HGB,HCT)
HCT: 57 % — ABNORMAL HIGH (ref 39.0–52.0)
Hemoglobin: 19.4 g/dL — ABNORMAL HIGH (ref 13.0–17.0)
Potassium: 4.5 mEq/L (ref 3.5–5.1)
Sodium: 134 mEq/L — ABNORMAL LOW (ref 135–145)

## 2010-11-24 LAB — URINALYSIS, MICROSCOPIC ONLY
Bilirubin Urine: NEGATIVE
Ketones, ur: NEGATIVE mg/dL
Nitrite: NEGATIVE
pH: 5 (ref 5.0–8.0)

## 2010-11-24 LAB — PROTEIN ELECTROPH W RFLX QUANT IMMUNOGLOBULINS
Beta 2: 5.3 % (ref 3.2–6.5)
Beta Globulin: 6 % (ref 4.7–7.2)
Gamma Globulin: 15.2 % (ref 11.1–18.8)
M-Spike, %: NOT DETECTED g/dL

## 2010-11-24 LAB — FERRITIN: Ferritin: 179 ng/mL (ref 22–322)

## 2010-11-24 LAB — DIFFERENTIAL
Basophils Absolute: 0.1 10*3/uL (ref 0.0–0.1)
Basophils Absolute: 0.1 10*3/uL (ref 0.0–0.1)
Basophils Relative: 1 % (ref 0–1)
Basophils Relative: 1 % (ref 0–1)
Eosinophils Absolute: 0.3 10*3/uL (ref 0.0–0.7)
Eosinophils Absolute: 0.5 10*3/uL (ref 0.0–0.7)
Eosinophils Absolute: 0.5 10*3/uL (ref 0.0–0.7)
Eosinophils Relative: 4 % (ref 0–5)
Eosinophils Relative: 7 % — ABNORMAL HIGH (ref 0–5)
Lymphs Abs: 2.1 10*3/uL (ref 0.7–4.0)
Lymphs Abs: 2.6 10*3/uL (ref 0.7–4.0)
Monocytes Relative: 10 % (ref 3–12)
Neutrophils Relative %: 48 % (ref 43–77)
Neutrophils Relative %: 49 % (ref 43–77)

## 2010-11-24 LAB — POCT CARDIAC MARKERS: CKMB, poc: 3 ng/mL (ref 1.0–8.0)

## 2010-11-24 LAB — POCT I-STAT, CHEM 8
Creatinine, Ser: 7.1 mg/dL — ABNORMAL HIGH (ref 0.4–1.5)
HCT: 41 % (ref 39.0–52.0)
Hemoglobin: 13.9 g/dL (ref 13.0–17.0)
Sodium: 142 mEq/L (ref 135–145)
TCO2: 19 mmol/L (ref 0–100)

## 2010-11-24 LAB — HIV ANTIBODY (ROUTINE TESTING W REFLEX): HIV: NONREACTIVE

## 2010-11-24 LAB — LIPID PANEL
Cholesterol: 198 mg/dL (ref 0–200)
HDL: 23 mg/dL — ABNORMAL LOW (ref >39)
LDL Cholesterol: 139 mg/dL — ABNORMAL HIGH (ref 0–99)
Total CHOL/HDL Ratio: 8.6 RATIO

## 2010-11-24 LAB — ECHOVIRUS ABS PANEL (CSF)
Echovirus Ab Type 11: <1:10 {titer}
Echovirus Ab Type 30: <1:10 {titer}

## 2010-11-24 LAB — CREATININE, URINE, 24 HOUR
Creatinine, 24H Ur: 944 mg/d (ref 800–2000)
Urine Total Volume-UCRE24: 1900 mL

## 2010-11-24 LAB — PROTEIN, URINE, 24 HOUR: Protein, 24H Urine: 2793 mg/d — ABNORMAL HIGH (ref 50–100)

## 2010-11-24 LAB — BRAIN NATRIURETIC PEPTIDE: Pro B Natriuretic peptide (BNP): 483 pg/mL — ABNORMAL HIGH (ref 0.0–100.0)

## 2010-11-24 LAB — CMV ABS, IGG+IGM (CYTOMEGALOVIRUS)
CMV IgM: <8 AU/mL (ref <30.0)
Cytomegalovirus Ab-IgG: 3 U/mL — ABNORMAL HIGH (ref <0.4)

## 2010-11-24 LAB — PTH, INTACT AND CALCIUM
Calcium, Total (PTH): 5.6 mg/dL — CL (ref 8.4–10.5)
PTH: 188 pg/mL — ABNORMAL HIGH (ref 14.0–72.0)

## 2010-11-24 LAB — ANGIOTENSIN CONVERTING ENZYME: Angiotensin-Converting Enzyme: 31 U/L (ref 9–67)

## 2010-11-24 LAB — CK TOTAL AND CKMB (NOT AT ARMC)
Relative Index: 0.6 (ref 0.0–2.5)
Relative Index: 0.6 (ref 0.0–2.5)

## 2010-11-24 LAB — MAGNESIUM: Magnesium: 2.9 mg/dL — ABNORMAL HIGH (ref 1.5–2.5)

## 2010-11-24 LAB — RSV(RESPIRATORY SYNCYTIAL VIRUS) AB, BLOOD: RSV Ab, IgM: 0.08 IV

## 2010-11-24 LAB — ANA: Anti Nuclear Antibody(ANA): NEGATIVE

## 2010-12-29 NOTE — Op Note (Signed)
NAMEDAVARIAN, LOHSE               ACCOUNT NO.:  [id number]   MEDICAL RECORD NO.:  [id number]          PATIENT TYPE:  AMB   LOCATION:  SDS                          FACILITY:  [facility]   PHYSICIAN:  Theotis Burrow IV, MDDATE OF BIRTH:  21-Jan-1971   DATE OF PROCEDURE:  03/27/2009  DATE OF DISCHARGE:  03/27/2009                               OPERATIVE REPORT   PREOPERATIVE DIAGNOSIS:  Nonmaturing left wrist fistula.   POSTOPERATIVE DIAGNOSIS:  Nonmaturing left wrist fistula.   PROCEDURE PERFORMED:  Branch ligation of left wrist fistula.   ANESTHESIA:  MAC.   COMPLICATIONS:  None.   INDICATIONS:  This is a 40 year old gentleman with end-stage renal  disease, who has a left radiocephalic fistula that is not matured.  Fistulogram revealed 2 branches.  The largest of which is at the wrist.  He comes in for ligation.   PROCEDURE:  The patient was identified in the holding area and taken to  room 9.  He was placed supine on the table.  MAC anesthesia was  administered.  The patient was prepped and draped in standard sterile  fashion.  A time-out was called.  Ultrasound was used to identify the  branch.  Lidocaine 1% was used for local anesthesia.  A longitudinal  incision was made over the branch that was isolated sharply and  encircled with 3-0 silk ties, and then ligated.  Upon ligation of the  branch, there was improvement in the thrill within the fistula.  The  wound was then irrigated.  The tissue was closed in two layers with 3-0  Vicryl.  The patient was taken to the recovery room in stable condition.      Eldridge Abrahams, MD  Electronically Signed     V. Leia Alf, MD  Electronically Signed    VWB/MEDQ  D:  03/27/2009  T:  03/28/2009  Job:  [id number]

## 2010-12-29 NOTE — Assessment & Plan Note (Signed)
OFFICE VISIT   [name], [name]  DOB:  07-Dec-1970                                       06/05/2009  [id number]   I saw the patient in the office today for followup of his left upper arm  AV fistula.  He had a poorly functioning left forearm AV fistula and  therefore we felt the next best option was to attempt an upper arm  fistula.  His forearm fistula was ligated on 04/22/2009 and in the same  setting we placed an upper arm fistula.  He had a high bifurcation of  his brachial artery so the upper arm fistula was actually a  radiocephalic fistula.  He comes in for a 6 week followup visit.  He has  no specific complaint and he has been exercising the arm quite a bit and  he has had no arm pain or swelling.   PHYSICAL EXAMINATION:  Vital signs:  Blood pressure is 129/78, heart  rate is 110.  The fistula has an excellent thrill and bruit.  It appears  to be gradually enlarging.  He has palpable radial pulse.  His incision  has healed nicely.   Overall I am pleased with his progress and I think this hopefully will  provide adequate access for long-term dialysis.  Once we know the  fistula is working we can work on getting his catheter out.  I suspect  it will be at least another 6 weeks before the fistula could be  cannulated.   Judeth Cornfield. Scot Dock, M.D.  Electronically Signed   CSD/MEDQ  D:  06/05/2009  T:  06/06/2009  Job:  [id number]   cc:   [employer]

## 2010-12-29 NOTE — H&P (Signed)
[name], [name]               ACCOUNT NO.:  [id number]   MEDICAL RECORD NO.:  [id number]          PATIENT TYPE:  EMS   LOCATION:  MAJO                         FACILITY:  [facility]   PHYSICIAN:  Blima Rich, MD      DATE OF BIRTH:  1970-11-04   DATE OF ADMISSION:  12/23/2008  DATE OF DISCHARGE:                              HISTORY & PHYSICAL   ADMITTING SERVICE:  [facility].   CHIEF COMPLAINT:  Shortness of breath and cough times six weeks.   HISTORY OF PRESENT ILLNESS:  [name] is a pleasant 40 year old  African American male with no significant past medical  history/significant social history.  He presents with chief complaints  of cough x6 weeks.  He also complains that over this period of time he  has been getting progressively more short of breath with increasing  dyspnea on exertion.  Apparently, approximately six weeks ago he had an  upper respiratory tract infection (wife claims that she was infected  with rhinovirus and then the patient got infected as well).  He  recovered from that and then he developed this chronic cough.  He is  currently not taking any medications save over-the-counter Delsym as  well as has taken Nasonex but did not have any significant improvement.  The cough has now been chronic.  He seems to have no expectoration, and  he has no hemoptysis.  He claims that over a period of time, he has  noticed over the past four weeks that he has been getting more short of  breath inquiring additional pillows during sleep which increased to  three total (at baseline requiring one), and then subsequently, as over  the past three days, he has been sleeping upright in a chair.  He also  complains that when he falls asleep he is suddenly aroused, having a  sensation of being suffocated.  He sits up, leans by the side of the  bed, gathers his breath, and then once he feels better, will try to fall  back asleep and then is awakened  multiple times with similar sensations.  He denies any chest pain, palpitations.  He denies any lower extremity  edema.  Does complain of increased satiety.  He claims that preceding  these past four weeks or so, he had no limitations in performing his  activities of daily living.  Now, he claims just walking from the room  to the elevator will get him short of breath, or walking from the  supermarket to his car carrying a bag of groceries will get him short of  breath requiring him to stop to recovery.  During my interview, the  patient was noticeably short of breath, though not gasping during  routine conversations.   The patient denies syncope, presyncope.  No lightheadedness.  No  blurring of vision.  No odynophagia, no dysphagia.  No tinnitus.  No  recent sick contacts except his wife approximately preceding 6 weeks  ago.  Denies expectoration.  Positive shortness of breath, dyspnea on  exertion, PND and orthopnea.  No chest pain or palpitations.  Positive  early satiety.  No abdominal distention.  No abdominal pain, nausea,  vomiting, diarrhea, constipation, dysuria, polyuria, hematuria, bright  red blood per rectum or melanotic stools.  He denies any overt  musculoskeletal complaints.   Upon arrival, the patient was noted to have a creatinine initially  measuring at 7.1, and a recheck demonstrating a creatinine of 6.5.  Also  of note, chest x-ray demonstrated bilateral pleural effusions at  interstitial edema.  Although, cardiac silhouettes is also enlarged  demonstrating cardiomegaly.  A CT angiogram was performed in the  emergency department demonstrating interstitial prominence with airway  thickening and distinct marked mildly enlarged mediastinal lymph nodes  with suggestion of congestive heart failure with bilateral pleural  effusions and pericardial effusion.  He has received Mucomyst  postprocedure in regards to his renal function.   PAST MEDICAL/SURGICAL HISTORY:   None.   SOCIAL HISTORY:  Denies tobacco, illicits or alcohol.  Has been married  for 18 years.  Is committed to his marriage.  Has two children.  No  risky sexual behavior.   FAMILY HISTORY:  Mother has a history of hypertension.  Father had  history of diabetes mellitus and end stage renal disease, died at the  age of 73.  His siblings have hypertension and diabetes.  No history of  sudden cardiac death or congestive heart failure or coronary artery  disease.   ALLERGIES:  No known drug allergies.   HOME MEDICATIONS:  None.   REVIEW OF SYSTEMS:  A 14-point review of systems performed.  Pertinent  positives negative as described above.   PHYSICAL EXAMINATION:  VITAL SIGNS:  At time of presentation,  temperature 98.6, heart rate 110, blood pressure 151/100, respirations  18, O2 saturation 995 on room air.  GENERAL:  Well-developed, well-nourished African American male sitting  up in bed comfortably in no acute distress.  HEENT:  Normocephalic, atraumatic.  Moist oral mucosa.  No thrush,  erythema or post nasal drip.  Eyes:  Anicteric.  Extraocular movements  intact.  Pupils are equal and reactive to light and accommodation.  NECK:  Supple.  Good range of motion.  No thyromegaly.  Right atrial  pressure approximately 12-15 cm of H2O.  No carotid bruits.  CARDIOVASCULAR:  S1, S2 normal.  Tachycardic.  No murmurs, rubs or  gallops appreciated.  The patient has both an S3 and an S4.  RESPIRATORY:  Air entry bilaterally decreased bases with crackles in the  bases up to the mid lung zones.  No rhonchi or wheezes appreciated.  ABDOMEN:  Soft, nontender, nondistended.  Positive mild hepatomegaly.  Approximately 1 cm.  No splenomegaly.  Positive bowel sounds.  EXTREMITIES:  No cyanosis, clubbing or edema.  Positive bilateral  dorsalis pedis.  CNS:  Alert and oriented x3.  Cranial nerves II-XII grossly intact.  Power, sensation, reflex bilaterally symmetrical.  SKIN:  No breakdown,  swelling, ulcerations or masses.  HEME/ONC:  No palpable lymphadenopathy, ecchymosis, bruising or  petechiae.   STUDIES:  Chest x-ray reviewed by self demonstrates cardiomegaly,  bilateral pleural effusions with interstitial edema.  CT angiogram of  the chest as described above.  EKG read by self demonstrates decreased  borderline voltage approximately 5 mm in a healthy male who is not obese  with Q wave inversions in the lateral leads and left anterior fascicular  block.   LABORATORY DATA:  Sodium 140, potassium 3.7, chloride 115, CO2 17,  glucose 206, BUN 52, creatinine 6.5, calcium 5.7.  BNP 649.  CBC  demonstrates WBC 8.8, hemoglobin 12.6, hematocrit 37.8, platelet count  87, polymorphs 58.  Troponin I less than 0.05, myoglobin greater than  500, CK MB 3.0.   ASSESSMENT/PLAN:  1. Congestive heart failure.  Though we do not have a documented left      ventricular systolic function/echo, I presume based on his      examination and his symptoms, the patient has congestive heart      failure.  His chest x-ray would corroborate this.  At this time,      give 40 mg of IV Lasix, strict I's and O's, daily weights and      restrict fluid intake to 1.5 liters per day.  Also, schedule serial      cardiac enzymes q.6 h apart times a total of three sets.  It is      unlikely that the patient has ischemic heart disease.  More likely      than not, either he has a viral process or metabolic derangement      which has resulted in depressed systolic function.  Therefore,      check CMB, EBV, RSV as well as influenza virologies.  Check      hepatitis profile, check HIV.  Also, check iron profile and check      serum and urine protein electrophoresis as well.  Obtain a 2D      echocardiogram to assess the left ventricular systolic function,      and the patient will require cardiology consult in the morning.      Also, check rheumatological disease factors including ESR, CRP and      rheumatoid  factor.  At this time, I do not feel that beta blockers      would be beneficial.  He is in active congestive heart failure.      Once he is stabilized and euvolemic, we can reinitiate beta      blockers.  In regards to ACE inhibitors, at this time, due to his      renal function, we will hold off.  Further assessments will be made      at a later time.  Start patient on aspirin 81 mg p.o. daily.  In      regards to statin, he will require one prior to starting.  Thorough      workup would be essential, and I would like to check a fasting      lipid profile as well.  2. Acute renal insufficiency.  The patient's baseline renal function      is unknown.  At this time, the patient has never been told that he      has renal insufficiency.  We will check ultrasound of bilateral      kidneys.  Also, urine protein electrophoresis.  Check urine      eosinophils.  I believe he will require 24-hour urine collection.      Though, nephrology is to see the patient and further assessments      and requirements can be discerned at that time.  3. Gastrointestinal/deep vein thrombosis prophylaxis.  Heparin 5000      units subcu q.8 and Protonix 40 mg p.o. daily.   At this time, the etiology of the patient's current condition is  unknown.  I believe that further workup including left heart  catheterization, possible right heart cath with biopsy as well as  evaluation for disease states such as amyloidosis and hemachromatosis is  not unreasonable.      Blima Rich, MD  Electronically Signed     SP/MEDQ  D:  12/23/2008  T:  12/23/2008  Job:  [id number]

## 2010-12-29 NOTE — Consult Note (Signed)
NAMEQI, GIGNAC               ACCOUNT NO.:  [id number]   MEDICAL RECORD NO.:  [id number]          PATIENT TYPE:  INP   LOCATION:  5524                         FACILITY:  [facility]   PHYSICIAN:  Denice Bors. Stanford Breed, MD, FACCDATE OF BIRTH:  1971/03/09   DATE OF CONSULTATION:  12/23/2008  DATE OF DISCHARGE:                                 CONSULTATION   PRIMARY CARE PHYSICIAN:  None.   PRIMARY CARDIOLOGIST:  Denice Bors. Stanford Breed, MD, [facility]   CHIEF COMPLAINT:  Shortness of breath.   HISTORY OF PRESENT ILLNESS:  [name] is a 40 year old male with no  history of coronary artery disease.  He had onset approximately on October 14, 2008 of nausea, vomiting, and diarrhea which lasted approximately 4  days.  He does think he had a fever, but did not check.  His wife was  ill and she was diagnosed with rhinovirus.  Approximately 1 week later,  he developed dry cough.  Approximately 2 weeks later, he developed  dyspnea on exertion.  Over the last 6 weeks, the dyspnea on exertion has  progressed to include shortness of breath with conversation, orthopnea ,  and PND over the last 3 days.  He has never had chest pain.  He denies  short of breath at rest, but even while lying still, he gets short of  breath with conversation.  He slept very little over the last 2-3  nights, and he came to the ER yesterday because he was tired of this  symptom.  His shortness of breath has improved somewhat with IV Lasix.   PAST MEDICAL HISTORY:  He had a DOT physical approximately 1 year ago  and was told that his blood pressure was a little high.  He has not had  recent medical care and does not know of any medical problems.  He was  seen in the emergency room for bronchitis in February 2009 and at that  time his blood pressure was 164/105.   PAST SURGICAL HISTORY:  None.   ALLERGIES:  No known drug allergies.   MEDICATIONS PRIOR TO ADMISSION:  None.   CURRENT MEDICATIONS:  1. Mucomyst 600 mg q.12 h.  2.  Aspirin 81 mg a day.  3. Lasix 40 mg IV daily.  4. Mucinex 600 mg b.i.d.  5. Heparin 5000 units every 8 hours.  6. Protonix 40 mg a day.   SOCIAL HISTORY:  He lives in [place] with his wife and works as a  Sports coach.  He denies alcohol, tobacco, or drug abuse.   FAMILY HISTORY:  His mother is alive in her 38s with a history of  hypertension.  His father died at age 58 with no history of coronary  artery disease and no siblings have heart disease.   REVIEW OF SYSTEMS:  He denies fevers, chills, or sweats.  He has not had  edema.  He had some tachy palpitations when he took Mucinex-D, but these  resolved once the medicine wore off.  He describes gradually decreasing  urine output and dark colored urine, but no dysuria.  He has occasional  arthralgias.  He denies reflux symptoms or melena.  He has been  coughing, but it is nonproductive, and there is no wheeze.  Full 14-  point review of systems is otherwise negative.   PHYSICAL EXAMINATION:  VITAL SIGNS:  Temperature is 98.4, blood pressure  157/114, pulse 102, respiratory rate 19, O2 saturation 96% on room air.  GENERAL:  He is a well-developed, well-nourished African-American male  in no acute distress at rest.  HEENT:  Essentially normal.  NECK:  There is no lymphadenopathy, thyromegaly, or bruits noted.  He  does have JVD at 8 cm.  CARDIOVASCULAR:  His heart is regular in rate and rhythm with an S1, S2,  and an S3 gallop is noted.  Distal pulses are intact in all 4  extremities.  No femoral bruits are appreciated.  LUNGS:  He has crackles bilaterally.  SKIN:  No rashes or lesions are noted.  ABDOMEN:  Soft and nontender with active bowel sounds.  EXTREMITIES:  There is no cyanosis, clubbing, or edema noted.  MUSCULOSKELETAL:  There is no joint deformity or effusions, and no spine  or CVA tenderness.  NEUROLOGIC:  He is alert and oriented.  Cranial nerves II through XII  grossly intact.   CT angiogram of the chest shows  interstitial prominence question  interstitial edema for his CHF as well as bilateral effusions and a  small pericardial effusion.   EKG; sinus rhythm rate 99 with no acute ischemic changes; however, he  has lateral inverted T waves, possible LVH.   LABORATORY VALUES:  Hemoglobin 13.9, hematocrit 18.8, WBC is 41,  platelets 87, sodium 140, potassium 3.9, chloride 115, CO2 of 17, BUN  51, creatinine 6.51, glucose 206, BNP is 649.  CK-MB #1, 587/2.5, #2  595/3.8.  Troponin I 0.04 and 0.03, myoglobin on point-of-care marker is  greater than 500, TSH 2.263, total cholesterol 198, triglycerides 178,  HDL 23, LDL 139.   Echocardiogram preliminary results are EF of 15-20%.   IMPRESSION:  [name] was seen today at 2:15 p.m. by Dr. Stanford Breed.  He  is a 40 year old male with a past medical history of borderline  hypertension who we are asked to evaluate for dyspnea and CHF symptoms.  He presents with a 6-week history of cough, dyspnea on exertion,  orthopnea, and PND.  He has no pedal edema and only one brief episode of  chest pain 2 days ago, duration approximately 10 minutes and described  as a cramping pain, but not pleuritic.  He has a recent GI viral illness  in the family.  His EKG is sinus tachycardia with prolonged QT and  lateral T-wave inversions.  CT angiogram showed CHF, cardiac  enlargement, pericardial effusion, and bilateral pleural effusions.  BUN  and creatinine are significantly elevated at 51 and 6.51 and BNP is  elevated as well at 649.   Since his LV function is low, he will be treated with hydralazine,  nitrates, and beta blockers, but no ACE inhibitors secondary to renal  insufficiency.  He will need a workup for the cause of his left  ventricular function.  It is possibly secondary to hypertension as he  has no history EtOH abuse and a normal TSH.  It is questionably viral  cardiomyopathy with recent infection in the family.  Ischemia would need  to be excluded, but  with renal insufficiency no cath will be performed.  We will perform a Myoview at some point.  The ferritin needs to be  checked  as well.  Nephrology is seeing the patient and their input is  extremely important.      Rosaria Ferries, PA-C      Denice Bors. Stanford Breed, MD, [facility]  Electronically Signed    RB/MEDQ  D:  12/23/2008  T:  12/24/2008  Job:  [id number]

## 2010-12-29 NOTE — Op Note (Signed)
NAMEDEANDREW, SEANEY               ACCOUNT NO.:  [id number]   MEDICAL RECORD NO.:  [id number]          PATIENT TYPE:  AMB   LOCATION:  SDS                          FACILITY:  [facility]   PHYSICIAN:  Theotis Burrow IV, MDDATE OF BIRTH:  1971-05-07   DATE OF PROCEDURE:  03/20/2009  DATE OF DISCHARGE:  03/20/2009                               OPERATIVE REPORT   PREOPERATIVE DIAGNOSIS:  Nonmaturing left radiocephalic fistula.   POSTOPERATIVE DIAGNOSIS:  Nonmaturing left radiocephalic fistula.   PROCEDURE PERFORMED:  1. Ultrasound access, left radiocephalic fistula.  2. Fistulogram.   SURGEON:  1. Annamarie Major IV, MD   INDICATIONS:  This is a 40 year old gentleman who has undergone left  radiocephalic fistula.  He comes in today for fistulogram as this has  not matured.   PROCEDURE IN DETAIL:  The patient was identified in the holding area and  taken to room 8 and he was placed supine on the table.  The left arm was  prepped and draped in standard sterile fashion.  Time-out was called.  Lidocaine 1% was used for local anesthesia.  The left-sided fistula was  evaluated by ultrasound and found to be widely patent and easily  compressible.  It was accessed under ultrasound guidance with an 18-  gauge needle and 0.018 mandrel wire was advanced into the fistula under  fluoroscopic visualization.  A micropuncture sheath was placed.  Contrast injections of the venous side of fistula were performed through  the sheath.  Evaluation of the arterial anastomosis was difficult.  I  attempted to use manual compression as well as compression using a blood  pressure cuff.  The arterial anastomosis was not well identified.   FINDINGS:  There is a large dominant branch proximally 4 cm from the  arterial anastomosis which appears to be hemodynamically significant.  There is also a large branch at the antecubital area which gives rise to  the basilic venous system.  The cephalic vein is patent  throughout its  entire course.  There is no evidence of central venous stenosis.  Again,  the arterial anastomosis was not well evaluated due to the inability to  reflux contrast across it despite manual compression and with  compression from a blood pressure cuff.   IMPRESSION:  1. Arterial venous anastomosis, not adequately evaluated.  2. Large branch just proximal to the anastomosis which is a      hemodynamically significant.  3. The cephalic vein gives rise to the basilic vein in the arm crease.  4. No central stenosis.      Eldridge Abrahams, MD  Electronically Signed     VWB/MEDQ  D:  03/20/2009  T:  03/21/2009  Job:  [id number]

## 2010-12-29 NOTE — Cardiovascular Report (Signed)
NAMEHOLSTON, GARTMAN               ACCOUNT NO.:  [id number]   MEDICAL RECORD NO.:  [id number]          PATIENT TYPE:  INP   LOCATION:  6739                         FACILITY:  [facility]   PHYSICIAN:  Wallis Bamberg. [name], MD, FACCDATE OF BIRTH:  1970-10-21   DATE OF PROCEDURE:  01/02/2009  DATE OF DISCHARGE:                            CARDIAC CATHETERIZATION   A 40 year old patient with cardiomyopathy, rule out coronary artery  disease.   Cine catheterization was done with 5-French catheter from right femoral  artery.   Left main coronary artery was normal.   Left anterior descending artery was normal.  First and second diagonal  branches were fairly small and normal.  The LAD wrapped the apex.   The circumflex coronary artery was dominant.  There was a very large  first obtuse marginal branch, which was normal.  Second obtuse marginal  branch was normal.   The right coronary artery was small and nondominant.   We crossed the aortic valve for pressures.  LV pressure was 140/5.  Aortic pressure was 140/74.   IMPRESSION:  Nonischemic cardiomyopathy.  The patient has no evidence of  coronary artery disease despite the interpretation of his Myoview.  He  tolerated the procedure well.  He will continue with dialysis.      Wallis Bamberg. [name], MD, [facility]  Electronically Signed     PCN/MEDQ  D:  01/02/2009  T:  01/02/2009  Job:  [id number]

## 2010-12-29 NOTE — Discharge Summary (Signed)
[name], [name]               ACCOUNT NO.:  [id number]   MEDICAL RECORD NO.:  [id number]           PATIENT TYPE:   LOCATION:                                 FACILITY:   PHYSICIAN:  Neysa Bonito, MD  DATE OF BIRTH:  01-22-1971   DATE OF ADMISSION:  DATE OF DISCHARGE:                               DISCHARGE SUMMARY   CHIEF COMPLAINT:  Shortness of breath and cough for the last 6 weeks  prior to presentation.   BRIEF HISTORY OF PRESENT ILLNESS:  [name] is a very pleasant 40-year-  old African American male, admitted with a chief complaint of cough and  found to have renal failure.   HOSPITAL PROCEDURES:  1. Current lab test showing sodium 138, potassium 4.4, chloride 105,      bicarb is 22, glucose 172, creatinine 7.7.  Estimated GFR with      those number is 9.  White blood count is 10.9, hemoglobin is 11.8,      hematocrit is 35.2, and platelet count is 96.  2. Chest x-ray on Dec 22, 2008.  Impression is a small bilateral pleural effusion and left basilar  airspace disease, could be due to asymmetric edema.  Airspace disease in  the left lung could be due to pneumonia.  1. CT angiogram on Dec 22, 2008, the indication is not clear, but the      impression is:      a.     Interstitial prominence with airway thickening and       indistinct mildly enlarged mediastinal lymph nodes.  The       appearance and the congestion with the left ventricular dilatation       suggest interstitial edema and congestive heart failure.      b.     Bilateral pleural effusion and pericardial effusion.  Please       note that there is some dependent density along the left parietal       pleura, such that mass or exudative effusion cannot be confidently       excluded.  Please note that the patient inadvertently received contrast medium  despite a creatinine of 6.8.  I discussed this with Dr. Jola Schmidt by  the telephone after being notified, and I recommend a Nephrology  consultation.  1. Renal ultrasound on Dec 23, 2008.  Impression:  a.  There is no evidence of renal obstruction.  b.  Small hyperechoic kidney, consistent with medical renal disease.  c.  Small bilateral pleural effusion.  d.  Simple right upper pole renal cyst.  1. Nuclear medicine Myoview on Dec 26, 2008.  Impression:  a.  Finding worrisome for multivessel coronary artery disease.  b.  No convincing evidence of ischemia; however, marked left ventricular  enlargement.  Severe global hypokinesis.  c.  Estimated QGS ejection fraction is 21%.  1. Dec 30, 2008, the patient had chest x-ray.  Impression:  a.  Marked improvement in the left greater than right pleural effusion  and basilar airspace disease.  b.  Cardiomegaly.  1. Dec 30, 2008, chest  x-ray after internal jugular approach.  Impression is that internal jugular approach dialysis catheter with the  tip of the proximal lumen at the junction of the superior vena cava and  the right atrium and distal lumen deep with the right atrium.  No  pneumothorax.  Cardiomegaly and vascular congestion.  1. Fluoroscopy-guided central venous line, done by Dr. Oneida Alar.  2. A 2-D echo on Dec 23, 2008.  Impression:  Left ventricular cavity size was mildly dilated.  Systolic  function was severely reduced.  Estimated ejection fraction was in the  range of 20-25% with diffuse hypokinesis.   THE HOSPITAL CONSULTATIONS:  1. Richard F. Hassell Done, MD with Nephrology.  2. Denice Bors. Stanford Breed, MD, [facility] is Cardiology.  3. Dr. Cornelia Copa, Nephrology.  4. Vascular Surgery, Jessy Oto. Fields, MD   OPERATIVE REPORT:  The patient had ultrasound of the neck on Dec 30, 2008, with right internal jugular vein Diatek catheter.  Left  radiocephalic arteriovenous fistula.   DISCHARGE DIAGNOSES PLUS THE CURRENT MEDICAL PROBLEMS:  1. Cardiomyopathy with severely depressed ejection fraction as      estimated 21%.  2. Chronic kidney disease:  The patient just started on dialysis.  3.  Possible cardiac ischemia:  Workup is pending.   DISCUSSION AND HOSPITAL COURSE:  1. The patient was found with elevated creatinine as discussed.      Unfortunately, he also received intravenous contrast on the day of      admission, before I started attending to him.  The patient      obviously needed the hemodialysis, but he wanted sometime to make      up his mind.  Currently, the patient has a Diatek and AV      graft/shunt placed as discussed.  He started dialysis since      yesterday, and he will be continuing dialysis for sometime.  2. The patient found was to have cardiomyopathy and possibility of      ischemia.  Cardiology was following up through.  The plan is to do      left heart coronary angiogram to rule out the possibility of      ischemia or shed light in regards to his cardiomyopathy.  3. Hypertension, improved after congestive heart failure management:      Now, the patient has a stable blood pressure.   DISCHARGE MEDICATIONS:  This will be dictated on the day of actual  discharge.      Neysa Bonito, MD  Electronically Signed     EME/MEDQ  D:  12/31/2008  T:  01/01/2009  Job:  [id number]

## 2010-12-29 NOTE — Consult Note (Signed)
NAMEJASIRI, PUYEAR               ACCOUNT NO.:  [id number]   MEDICAL RECORD NO.:  [id number]          PATIENT TYPE:  INP   LOCATION:  5530                         FACILITY:  [facility]   PHYSICIAN:  Maudie Flakes. Hassell Done, M.D.   DATE OF BIRTH:  [birth year]   DATE OF CONSULTATION:  12/23/2008  DATE OF DISCHARGE:                                 CONSULTATION   PCP:  None.   REQUESTING PHYSICIAN:  Dr. Posey Pronto from [facility]:  Renal failure with inadvertent contrast  administration with creatinine of 7.1.   CHIEF COMPLAINT:  Shortness of breath and cough.   HISTORY OF PRESENT ILLNESS:  [name] is a 40 year old African  American right-handed man without any known past medical history who  presents to Emergency Department with 6-8 weeks of progressive dyspnea  and cough.  He and his wife reported a viral upper respiratory infection  approximately 6-8 weeks ago that resolved.  He continued with a dry  cough since that time.  Through this time, he has developed shortness of  breath with increased dyspnea on exertion, requiring him to stop after  one flight of stair to catch his breath, increased orthopnea, going from  previous 1 pillow to currently 3 pillows and paroxysmal nocturnal  dyspnea.  He reports some other symptoms including decreased appetite,  diarrhea, some nausea and vomiting, although these have waxed and waned  and currently not acute issue.  He has also had some subjective fevers.  His urine output has decreased and darken in color.  Nephrology has  currently consulted in light of significant renal failure and contrast  administration inadvertently with a creatinine of 7.1.   PAST MEDICAL HISTORY:  None known.   MEDICATIONS:  P.r.n. Delsym and Mucinex recently.   ALLERGIES:  No known drug allergies.   SOCIAL HISTORY:  He does janitorial work, he is married and born in  [place], [place].  He denies any tobacco, alcohol, or drug use.   FAMILY  HISTORY:  Positive for diabetes and hypertension.  He has 1  sister who is 45 year old on hemodialysis for end-stage renal disease  secondary to diabetes and hypertension.  Father also passed away at age  20 on hemodialysis secondary to diabetes and hypertension.  There is no  CHF in the family.   REVIEW OF SYSTEMS:  As per HPI.  All others reviewed and negative.   PHYSICAL EXAMINATION:  VITAL SIGNS:  Temperature 98.4, blood pressure  155/113, pulse of 102, respirations 19, and O2 saturation 96% on room  air.  GENERAL:  He is a pleasant African American man, lying in bed, in no  acute distress.  HEENT:  Normocephalic, atraumatic.  Pupils are equally round and  reactive to light and accommodation.  Extraocular movements are intact.  Sclerae are anicteric.  There are hemorrhages and capillary  microaneurysms bilaterally  NECK:  Supple.  No JVD.  No bruits.  No thyromegaly.  HEART:  S1 and S2.  Regular rate and rhythm.  No murmurs or rubs.  There  is a permanent S3 of the apex.  LUNGS:  Decreased air movement bilateral bases with crackles on the  right greater than left.  There are no rhonchi or wheezes.  ABDOMEN:  Positive bowel sounds, protuberant, but nondistended, soft,  nontender.  EXTREMITIES:  There is minimal trace pretibial edema.  LYMPHATICS:  There is no cervical lymphadenopathy.  SKIN:  There is no rash or ecchymosis.  NEUROLOGIC:  He is awake, alert and oriented x3.  Cranial nerves II  through XII are intact.  Strength is 5/5 x4.  R handed.  Sensation is  intact and there is no asterixis.  Funduscopic exam shows some  hemorrhage and microaneurysms.   LABORATORY DATA:  Sodium 140, potassium 3.9, chloride 115, bicarb 17,  BUN 51, creatinine 6.5, glucose 206, albumin 2.6, calcium 5.7,  creatinine is 7.1, phosphorous is 5.1, TSH is 2.26, hemoglobin A1c is  8.3, iron 33, TIBC 275, percent saturation is 12.  WBC is 8.8,  hemoglobin 12.6, and platelets 87.  CT angiogram  demonstrates findings  consistent with CHF and some mild adenopathy, of note the patient  received 80 mL of Omnipaque inadvertently.   ASSESSMENT AND PLAN:  1. Renal failure.  Given patient's history and reviewing current labs      it is likely chronic kidney disease.  Creatinine initially was 7.1      and he was given 80 mL of Omnipaque dye for CT angiogram      inadvertently.  Creatinine is currently 6.5, therefore, stable.  He      is currently receiving Lasix, given #3, CHF, and Mucomyst given      inadvertant contrast administration.  We will continue Mucomyst for      a total of 48 hours.  We will check renal ultrasound (assuming      small kidneys consistent with medical renal disease) and if so we      will obtain a vein mapping for AV graft/fistula.  We will also      check an SPEP and UPEP with reflex along with the urine protein and      creatinine.  We will also check a PTH, given as low calcium and      high phosphorus and start Os-Cal.  Currently, there is no      indication for urgent hemodialysis.  2. Questionable secondary hyperparathyroidism.  We will check PTH and      start Os-Cal as there is low calcium and high phosphorus.  3. Congestive heart failure.  EF 20% per cardiology with diffuse      hypokinesis.  Patient was markedly hypertensive on admission,      especially diastolic pressure making hypertensive cardiomyopathy      likely.  Also had recent viral illness with symptom onset,      therefore, viral cardiomyopathy is possible though less likely.      Regardless of the etiology, will treat with coreg, hydralazine and      imdur (no ACE-I secondary to renal failure and inadvertant contrast      administration).  Will need to rule out ischemic cardiomyopathy,      and because of current stage V renal failure, would prefer to avoid      cardiac catheterization with need for more contrast (already had      contrast load inadvertently).  Cardiology has recommended  myoview      if patient is not going to need dialysis this admission.  Since      there is no current urgency for  hemodialysis, would prefer myoview      in light of renal function.  4. Hyperphosphatemia.  Likely secondary to secondary      hyperparathyroidism from CKD.  Will check PTH and start Oscal as a      phosphate binder.  5. Hypocalcemia.  Likely secondary to secondary hyperparathyroidism      from CKD.  Will check PTH and start Oscal.  Will also check      magnesium as depletion could lead to hypocalcemia.  6. Anemia.  Again, likely secondary to renal failure (since this is      likely chronic).  % saturation is 12, therefore, will check      ferritin and if less than 600 will start IV iron replacement      (venofer load).  7. HTN.  Patient denies having a history of hypertension.  However,      given his current blood pressure and cardiomyopathy, will start      coreg/hydralazine/imdur (again, no ACE-I secondary to renal failure      and contrast administration).  Titrate medications as blood      pressure allows.  If he goes on hemodialysis, could then consider      ACE-I in place of hydralazine/imdur.  8. DM.  Again, this is a new diagnosis for the patient.  Hgb A1c 8.3      and glucose just greater than 200 (presumabaly fasting) which meets      criteria for diagnosis.  Will start sliding scale insulin and start      lantus if needed depending on amount of sliding scale used.    Dictation ended at this point      Alphia Moh, MD  Electronically Signed      Maudie Flakes. Hassell Done, M.D.  Electronically Signed    MA/MEDQ  D:  12/23/2008  T:  12/24/2008  Job:  [id number]

## 2010-12-29 NOTE — Assessment & Plan Note (Signed)
OFFICE VISIT   SUMNER, MAILLE  DOB:  1970-10-29                                       03/11/2009  [id number]   I saw Mr. Giffen in the office today to check on his slowly maturing  left radiocephalic fistula.  This was placed on Dec 30, 2008.  I  reviewed his operative note and it was noted that this was a 3 mm  cephalic vein and also the radial artery was fairly small, approximately  2 mm in diameter, with some spasm to about 1.5 mm in diameter.  His  fistula has been slow to mature.  He does have a functioning dialysis  catheter.  He dialyzes on Mondays, Wednesdays, and Fridays.  He has a  history of significant of congestive heart failure with a very low EF.  He was sent to have his fistula checked as it was slow to mature.   He has no specific complaints referable to the fistula.  His review of  systems and medications are documented on the medical history form in  his chart.   Clear bilaterally to auscultation.  The fistula in his left forearm has  a reasonable thrill and appears to be gradually enlarging.  I do not  feel any significant pulsatility to suggest a more central obstruction.  The hand is warm and well perfused.   Given that the fistula has been in since May and has been slow to  mature, I think it would be reasonable to proceed with a fistulogram to  see if there is any thing that can done to revise the fistula to help it  to mature faster.  We will arrange this on a nondialysis day and this  has been scheduled for March 20, 2009.   Judeth Cornfield. Scot Dock, M.D.  Electronically Signed   CSD/MEDQ  D:  03/11/2009  T:  03/12/2009  Job:  [id number]   cc:   [facility]

## 2010-12-29 NOTE — Op Note (Signed)
NAMENEDIM, VENN               ACCOUNT NO.:  [id number]   MEDICAL RECORD NO.:  [id number]          PATIENT TYPE:  INP   LOCATION:  5530                         FACILITY:  [facility]   PHYSICIAN:  Jessy Oto. Fields, MD  DATE OF BIRTH:  1970-12-31   DATE OF PROCEDURE:  12/30/2008  DATE OF DISCHARGE:                               OPERATIVE REPORT   PROCEDURES:  1. Ultrasound of neck.  2. Right internal jugular vein Diatek catheter.  3. Left radiocephalic arteriovenous fistula.   PREOPERATIVE DIAGNOSIS:  End-stage renal disease.   POSTOPERATIVE DIAGNOSIS:  End-stage renal disease.   ANESTHESIA:  General.   SURGEON:  Charles E. Fields, MD   ASSISTANT:  Ernesto Rutherford, PA-C   OPERATIVE FINDINGS:  1. A 23-cm Diatek catheter in right internal jugular vein.  2. Small left radial artery with spasm.  3. A 3-mm cephalic vein.   OPERATIVE DETAILS:  After obtaining informed consent, the patient was  taken to the operating.  The patient was placed in supine position on  the operating table.  After induction of general anesthesia and  placement of laryngeal mask, the patient's neck was inspected with an  ultrasound.  The right internal jugular vein was found to be widely  patent with normal compressibility and respiratory variation.  Next, the  patient's entire neck and chest were prepped and draped in the usual  sterile fashion.  An introducer needle was used to cannulate the  internal jugular vein under ultrasound guidance.  The patient was placed  in Trendelenburg position prior to this.  A 0.035 J-tip guidewire was  then threaded through the right internal jugular vein down in the right  atrium and into the inferior vena cava down to hepatic vein under  fluoroscopic guidance.  Next, sequential 12, 14, and 16-French dilators  were placed over the guidewire down into the right atrium.  Guidewire  and dilator were removed and a 23-cm Diatek catheter placed through the  peel-away sheath  down into the right atrium.  The peel-away sheath was  then removed.  The catheter was tunneled subcutaneously, cut to length,  and the hub attached.  The catheter was inspected under fluoroscopy,  found its tip to be in right atrium, and no kinks throughout its course.  Catheter was noted to flush and draw easily.  Catheter was sutured to  the skin with nylon sutures.  The neck insertion site was closed with  Vicryl stitch.  Catheter was then loaded with concentrated heparin  solution.  Attention was then turned the patient's left upper extremity.  The entire left upper extremity was prepped and draped in the usual  sterile fashion.  A longitudinal incision was made at the left wrist  midway between the cephalic vein and radial artery.  The cephalic vein  was of good quality, approximately 3 mm in diameter.  Small side  branches were ligated and divided between silk ties.  Next, the radial  artery was dissected free in the medial portion of the incision.  The  radial artery was fairly small, proximally 2 mm in diameter and had  some  spasm in it which made it decrease in size to about 1.5 mm in diameter.  Some lidocaine was placed on the artery topically to try to decrease  this spasm.  Vessel loops were used to control the artery proximally and  distally.  The patient was given 5000 units of intravenous heparin.  A  longitudinal opening was made in the radial artery and the vein sewn end  of vein to side of artery using running 7-0 Prolene suture.  Just prior  to completion anastomosis, this was fore-bled, back-bled, and thoroughly  flushed.  Anastomosis was secured.  Vessel loops were released.  There  was Doppler audible flow in the fistula immediately, however, there was  still a fair amount of spasm in the artery.  There was good Doppler flow  up to the midforearm through the fistula.  Hemostasis was obtained.  Again, some additional lidocaine was sprayed onto the artery to try to   decrease spasm over the time.  Hemostasis was obtained.  The wound was  then closed with running 3-0 Vicryl suture in the subcutaneous layer and  4-0 Vicryl subcuticular stitch.  The patient tolerated the procedure  well and there were no immediate complications.  Instrument, sponge, and  needle count was correct at the end of the case.  The patient was taken  to the recovery room in stable condition.      Jessy Oto. Fields, MD  Electronically Signed     CEF/MEDQ  D:  12/30/2008  T:  12/31/2008  Job:  [id number]

## 2010-12-29 NOTE — Discharge Summary (Signed)
[name], [name]               ACCOUNT NO.:  [id number]   MEDICAL RECORD NO.:  [id number]          PATIENT TYPE:  INP   LOCATION:  [id number]                         FACILITY:  [facility]   PHYSICIAN:  Durwin Nora, MDDATE OF BIRTH:  01/12/1971   DATE OF ADMISSION:  12/22/2008  DATE OF DISCHARGE:  01/06/2009                               DISCHARGE SUMMARY   ADDENDUM   This is an addendum to the discharge summary previously dictated by Dr.  Neysa Bonito on Jan 01, 2009.  This summarizes the events between  Jan 02, 2009 and Jan 05, 2009.  History and diagnosis remains the same.  The patient was noticed to have elevated leukocytosis of 15,000 as on  Jan 02, 2009, prompting the possibility of an occult infection and  appears to be having mild basilar crackles.  He was also noticed to have  a transient episode of hypoglycemia as of Jan 03, 2009, necessitating  lowering his Amaryl.  The patient was unable to be discharged home  because of no clarity by the medical insurance coverage.  Leukocytosis  spontaneously resolved without the use of antibiotics.  The patient was  subsequently discharged on Jan 06, 2009.   MEDICATIONS AT DISCHARGE:  1. Calcium carbonate 1500 mg b.i.d.  2. Nephro-Vite 1 tablet daily.  3. Aspirin 81 mg daily.  4. Hectorol 0.5 mcg with dialysis Monday, Wednesday, and Friday.  5. Coreg 3.125 mg b.i.d.  6. Tylenol 650 mg q.4 h. p.r.n.  7. Ambien 10 mg at bedtime p.r.n.   PHYSICAL EXAMINATION:  VITAL SIGNS:  Temperature 98, pulse 78,  respiratory rate is 18, and blood pressure 114/65.  HEENT:  Pupils equally reactive to light and accommodation.  NECK:  Supple.  CHEST:  Clinically clear, heart sounds 1 and 2.  Regular rate and  rhythm.  ABDOMEN:  Soft, nontender.  Bowel sounds soft.  No organomegaly.  CENTRAL NERVOUS SYSTEM:  Peripheral pulses present.  No pedal edema.  No  deficit.   LABORATORY DATA:  WBC 7.7, hemoglobin 10.3, platelet count 99.  Blood  glucose 134.  Chemistry; sodium 134, potassium 4.3, chloride 98,  bicarbonate 27, BUN 69, creatinine 10, phosphate 5.4.   DIET:  Renal.   ACTIVITY:  Increase slowly as tolerated.   FOLLOWUP:  The patient to follow up with his primary care physician in  the next 5-7 days.  Follow up with [facility] in the  next 1 week, this will be with Dr. Hassell Done.  Follow up with Dr. Stanford Breed,  Cardiology as needed in the next 3-4 weeks for his nonischemic  cardiomyopathy.      Durwin Nora, MD  Electronically Signed     MIO/MEDQ  D:  02/02/2009  T:  02/03/2009  Job:  [id number]

## 2010-12-29 NOTE — Assessment & Plan Note (Signed)
OFFICE VISIT   [name], [name]  DOB:  03/29/1971                                       04/14/2009  [id number]   REASON FOR VISIT:  Follow-up.   HISTORY:  This is a 40 year old gentleman who underwent a left  radiocephalic fistula placement that did not mature.  Fistulogram  revealed large competing branch.  This was ligated and the patient's  fistula still has not matured appropriately.  I think at that this point  the best option is to proceed with an upper arm fistula, potentially may  do this below the antecubital crease.  This has been scheduled for  Tuesday, September 7 at 12 noon.  The patient had a hard time being  n.p.o. until the afternoon last time, therefore I told him he could have  breakfast as long as he was done by 6:00 a.m. and we would plan on doing  his operation 6 hours later at noon.  This will be a left upper arm AV  fistula and ligation of a left wrist fistula.   Eldridge Abrahams, MD  Electronically Signed   VWB/MEDQ  D:  04/14/2009  T:  04/15/2009  Job:  [id number]   cc:   Windy Kalata, M.D.

## 2011-01-18 ENCOUNTER — Other Ambulatory Visit (HOSPITAL_COMMUNITY): Payer: Self-pay | Admitting: Nephrology

## 2011-01-18 DIAGNOSIS — N186 End stage renal disease: Secondary | ICD-10-CM

## 2011-01-25 ENCOUNTER — Other Ambulatory Visit (HOSPITAL_COMMUNITY): Payer: Self-pay | Admitting: Nephrology

## 2011-01-25 DIAGNOSIS — N186 End stage renal disease: Secondary | ICD-10-CM

## 2011-01-26 ENCOUNTER — Ambulatory Visit (HOSPITAL_COMMUNITY): Payer: Medicare Other

## 2011-01-28 ENCOUNTER — Other Ambulatory Visit (HOSPITAL_COMMUNITY): Payer: Self-pay | Admitting: Nephrology

## 2011-01-28 ENCOUNTER — Ambulatory Visit (HOSPITAL_COMMUNITY)
Admission: RE | Admit: 2011-01-28 | Discharge: 2011-01-28 | Disposition: A | Discharge status: Home / Self Care | Payer: Medicare Other | Source: Ambulatory Visit | Attending: Nephrology | Admitting: Nephrology

## 2011-01-28 DIAGNOSIS — I120 Hypertensive chronic kidney disease with stage 5 chronic kidney disease or end stage renal disease: Secondary | ICD-10-CM | POA: Insufficient documentation

## 2011-01-28 DIAGNOSIS — D649 Anemia, unspecified: Secondary | ICD-10-CM | POA: Insufficient documentation

## 2011-01-28 DIAGNOSIS — I878 Other specified disorders of veins: Secondary | ICD-10-CM

## 2011-01-28 DIAGNOSIS — N186 End stage renal disease: Secondary | ICD-10-CM

## 2011-01-28 DIAGNOSIS — T82898A Other specified complication of vascular prosthetic devices, implants and grafts, initial encounter: Secondary | ICD-10-CM | POA: Insufficient documentation

## 2011-01-28 DIAGNOSIS — Y832 Surgical operation with anastomosis, bypass or graft as the cause of abnormal reaction of the patient, or of later complication, without mention of misadventure at the time of the procedure: Secondary | ICD-10-CM | POA: Insufficient documentation

## 2011-01-28 DIAGNOSIS — I509 Heart failure, unspecified: Secondary | ICD-10-CM | POA: Insufficient documentation

## 2011-01-28 DIAGNOSIS — E119 Type 2 diabetes mellitus without complications: Secondary | ICD-10-CM | POA: Insufficient documentation

## 2011-01-28 DIAGNOSIS — N2581 Secondary hyperparathyroidism of renal origin: Secondary | ICD-10-CM | POA: Insufficient documentation

## 2011-01-28 DIAGNOSIS — I2510 Atherosclerotic heart disease of native coronary artery without angina pectoris: Secondary | ICD-10-CM | POA: Insufficient documentation

## 2011-01-28 MED ORDER — IOHEXOL 300 MG/ML  SOLN
100.0000 mL | Freq: Once | INTRAMUSCULAR | Status: AC | PRN
  Administered 2011-01-28: 55 mL via INTRA_ARTERIAL

## 2011-02-02 ENCOUNTER — Ambulatory Visit: Payer: Medicare Other | Admitting: Cardiology

## 2011-02-11 ENCOUNTER — Encounter: Payer: Self-pay | Admitting: Cardiology

## 2011-02-11 ENCOUNTER — Ambulatory Visit (INDEPENDENT_AMBULATORY_CARE_PROVIDER_SITE_OTHER): Payer: Medicare Other | Admitting: Cardiology

## 2011-02-11 DIAGNOSIS — I10 Essential (primary) hypertension: Secondary | ICD-10-CM

## 2011-02-11 DIAGNOSIS — I428 Other cardiomyopathies: Secondary | ICD-10-CM

## 2011-02-11 MED ORDER — CARVEDILOL 6.25 MG PO TABS: 6.2500 mg | ORAL_TABLET | Freq: Two times a day (BID) | ORAL | Status: DC

## 2011-02-11 NOTE — Assessment & Plan Note (Signed)
Management per nephrology.

## 2011-02-11 NOTE — Progress Notes (Signed)
HPI: Pleasant male for f/u of cardiomyopathy. Echo in July of 2011 revealed an ejection fraction of 30-35%, trace MR and TR. Previous cath in 2010 revealed normal coronary arteries. A TSH, ferritin, HIV, and ACE level were normal. A rheumatoid factor and ANA were also normal. He was treated medically for nonischemic cardiomyopathy. Patient also with h/o renal insufficiency felt secondary to hypertension and DM. Last seen in June of 2011. Since then, the patient denies any dyspnea on exertion, orthopnea, PND, pedal edema, palpitations, syncope or chest pain.    Current Outpatient Prescriptions  Medication Sig Dispense Refill  . Ascorbic Acid (VITAMIN C) 500 MG tablet Take 500 mg by mouth daily.        Marland Kitchen aspirin 81 MG tablet Take 81 mg by mouth daily.       . calcium-vitamin D (OSCAL WITH D 500-200) 500-200 MG-UNIT per tablet Take 1 tablet by mouth daily.        . carvedilol (COREG) 3.125 MG tablet Take 3.125 mg by mouth 2 (two) times daily.        Marland Kitchen docusate sodium (DULCOLAX) 100 MG capsule Take 100 mg by mouth as needed.        Marland Kitchen epoetin alfa (EPOGEN) 3000 UNIT/ML injection Take 6000 units IV with HD       . glipiZIDE (GLUCOTROL) 2.5 MG 24 hr tablet Take 2.5 mg by mouth daily.        Marland Kitchen glucose blood (PRODIGY TEST) test strip Use to test blood sugar level three times daily before meals       . Lancets MISC Use to test blood glucose three times a day       . Multiple Vitamin (RENAL MULTIVITAMIN/ZINC) TABS Take 1 tablet by mouth daily.        . promethazine (PHENERGAN) 25 MG tablet Take 1 tablet by mouth for nausea during your dialysis          Past Medical History  Diagnosis Date  . Non-ischemic cardiomyopathy     EF 20-25% 12/23/2008>>EF 40-45% 03/2009>>EF ??? 7/?/2011  . ESRD (end stage renal disease) on dialysis     1.Initiated HD via right per cate 12/30/2008 (MWF schedule at [facility]) 2.Left upper extremity A-V fistula by Dr Oneida Alar 12/30/2008 3.Aemia: Received Venofer 12/30/08 Ferritin  179 % sat 12 12/23/08, 4. Secondary Hyperparathyroidism- PTH 188, P 5.4, Ca 9.2 12/2008  . 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 Hypertension     resloved after initating HD, now with post HD hypotension  . Hemodialysis-associated hypotension     coreg d/c'd  . Anemia     Past Surgical History  Procedure Date  . Cataract extraction, bilateral   . Av fistula placement     left  . Photocoagulation     for diabetic retinopathy 08/1999    History   Social History  . Marital Status: Married    Spouse Name: N/A    Number of Children: N/A  . Years of Education: N/A   Occupational History  . Not on file.   Social History Main Topics  . Smoking status: Never Smoker   . Smokeless tobacco: Not on file  . Alcohol Use: No  . Drug Use: No  . Sexually Active: Not on file   Other Topics Concern  . Not on file   Social History Narrative  Has two children. No risky sexual behavior.    ROS: no fevers or chills, productive cough, hemoptysis, dysphasia, odynophagia, melena, hematochezia, dysuria, hematuria, rash, seizure activity, orthopnea, PND, pedal edema, claudication. Remaining systems are negative.  Physical Exam: Well-developed well-nourished in no acute distress.  Skin is warm and dry.  HEENT is normal.  Neck is supple. No thyromegaly.  Chest is clear to auscultation with normal expansion.  Cardiovascular exam is regular rate and rhythm.  Abdominal exam nontender or distended. No masses palpated. Extremities show no edema. neuro grossly intact  ECG Normal sinus rhythm at a rate of 82. Left axis deviation. No ST changes.

## 2011-02-11 NOTE — Patient Instructions (Signed)
Your physician wants you to follow-up in: [place] will receive a reminder letter in the mail two months in advance. If you don't receive a letter, please call our office to schedule the follow-up appointment.   Your physician has requested that you have an echocardiogram. Echocardiography is a painless test that uses sound waves to create images of your heart. It provides your doctor with information about the size and shape of your heart and how well your heart's chambers and valves are working. This procedure takes approximately one hour. There are no restrictions for this procedure.   INCREASE CARVEDILOL TO 6.25MG  TWICE DAILY

## 2011-02-11 NOTE — Assessment & Plan Note (Signed)
Blood pressure controlled. 

## 2011-02-11 NOTE — Assessment & Plan Note (Signed)
Plan increase coreg to 6.25 mg p.o. B.i.d. Blood pressure will not allow adding ACE inhibitor particularly on dialysis days. Repeat echocardiogram. I have considered referral to EP for ICD previously. However the benefit for ICD is reduced in dialysis patients and therefore we will continue medical therapy.

## 2011-02-16 ENCOUNTER — Ambulatory Visit (HOSPITAL_COMMUNITY): Payer: Medicare Other | Attending: Cardiology

## 2011-02-16 DIAGNOSIS — I428 Other cardiomyopathies: Secondary | ICD-10-CM | POA: Insufficient documentation

## 2011-02-16 DIAGNOSIS — I10 Essential (primary) hypertension: Secondary | ICD-10-CM | POA: Insufficient documentation

## 2011-02-16 DIAGNOSIS — I079 Rheumatic tricuspid valve disease, unspecified: Secondary | ICD-10-CM | POA: Insufficient documentation

## 2011-02-16 DIAGNOSIS — I059 Rheumatic mitral valve disease, unspecified: Secondary | ICD-10-CM | POA: Insufficient documentation

## 2011-02-16 DIAGNOSIS — E119 Type 2 diabetes mellitus without complications: Secondary | ICD-10-CM | POA: Insufficient documentation

## 2011-02-19 ENCOUNTER — Telehealth: Payer: Self-pay | Admitting: Cardiology

## 2011-02-19 NOTE — Telephone Encounter (Signed)
Spoke with pt, he is aware of echo results Fredia Beets

## 2011-02-19 NOTE — Telephone Encounter (Signed)
Calling back for test results

## 2011-02-22 ENCOUNTER — Telehealth: Payer: Self-pay | Admitting: Cardiology

## 2011-02-22 NOTE — Telephone Encounter (Signed)
Spoke with pt, aware of echo results [name]

## 2011-02-22 NOTE — Telephone Encounter (Signed)
Pt returned a phone call from this afternoon.  Did not know who or why someone called.  Call him at [phone].

## 2011-04-28 ENCOUNTER — Other Ambulatory Visit (HOSPITAL_COMMUNITY): Payer: Self-pay | Admitting: Nephrology

## 2011-04-28 DIAGNOSIS — N186 End stage renal disease: Secondary | ICD-10-CM

## 2011-05-04 ENCOUNTER — Encounter: Payer: PRIVATE HEALTH INSURANCE | Admitting: Internal Medicine

## 2011-05-06 ENCOUNTER — Ambulatory Visit (HOSPITAL_COMMUNITY)
Admission: RE | Admit: 2011-05-06 | Discharge: 2011-05-06 | Disposition: A | Discharge status: Home / Self Care | Payer: Medicare Other | Source: Ambulatory Visit | Attending: Nephrology | Admitting: Nephrology

## 2011-05-06 ENCOUNTER — Other Ambulatory Visit (HOSPITAL_COMMUNITY): Payer: Self-pay | Admitting: Nephrology

## 2011-05-06 DIAGNOSIS — N186 End stage renal disease: Secondary | ICD-10-CM

## 2011-05-06 DIAGNOSIS — Y832 Surgical operation with anastomosis, bypass or graft as the cause of abnormal reaction of the patient, or of later complication, without mention of misadventure at the time of the procedure: Secondary | ICD-10-CM | POA: Insufficient documentation

## 2011-05-06 DIAGNOSIS — T82898A Other specified complication of vascular prosthetic devices, implants and grafts, initial encounter: Secondary | ICD-10-CM | POA: Insufficient documentation

## 2011-05-06 MED ORDER — IOHEXOL 300 MG/ML  SOLN: 100.0000 mL | Freq: Once | INTRAMUSCULAR | Status: AC | PRN

## 2011-06-15 ENCOUNTER — Encounter: Payer: Self-pay | Admitting: Internal Medicine

## 2011-06-16 ENCOUNTER — Encounter: Payer: Self-pay | Admitting: Internal Medicine

## 2011-07-20 ENCOUNTER — Encounter: Payer: Self-pay | Admitting: Internal Medicine

## 2011-08-17 HISTORY — PX: KIDNEY TRANSPLANT: SHX239

## 2011-08-18 DIAGNOSIS — N2581 Secondary hyperparathyroidism of renal origin: Secondary | ICD-10-CM | POA: Diagnosis not present

## 2011-08-18 DIAGNOSIS — D509 Iron deficiency anemia, unspecified: Secondary | ICD-10-CM | POA: Diagnosis not present

## 2011-08-18 DIAGNOSIS — N186 End stage renal disease: Secondary | ICD-10-CM | POA: Diagnosis not present

## 2011-09-08 DIAGNOSIS — E119 Type 2 diabetes mellitus without complications: Secondary | ICD-10-CM | POA: Diagnosis not present

## 2011-09-08 DIAGNOSIS — N186 End stage renal disease: Secondary | ICD-10-CM | POA: Diagnosis not present

## 2011-09-16 DIAGNOSIS — N186 End stage renal disease: Secondary | ICD-10-CM | POA: Diagnosis not present

## 2011-09-17 DIAGNOSIS — N039 Chronic nephritic syndrome with unspecified morphologic changes: Secondary | ICD-10-CM | POA: Diagnosis not present

## 2011-09-17 DIAGNOSIS — D509 Iron deficiency anemia, unspecified: Secondary | ICD-10-CM | POA: Diagnosis not present

## 2011-09-17 DIAGNOSIS — N186 End stage renal disease: Secondary | ICD-10-CM | POA: Diagnosis not present

## 2011-09-17 DIAGNOSIS — N2581 Secondary hyperparathyroidism of renal origin: Secondary | ICD-10-CM | POA: Diagnosis not present

## 2011-09-20 DIAGNOSIS — N2581 Secondary hyperparathyroidism of renal origin: Secondary | ICD-10-CM | POA: Diagnosis not present

## 2011-09-20 DIAGNOSIS — D509 Iron deficiency anemia, unspecified: Secondary | ICD-10-CM | POA: Diagnosis not present

## 2011-09-20 DIAGNOSIS — N039 Chronic nephritic syndrome with unspecified morphologic changes: Secondary | ICD-10-CM | POA: Diagnosis not present

## 2011-09-20 DIAGNOSIS — N186 End stage renal disease: Secondary | ICD-10-CM | POA: Diagnosis not present

## 2011-09-22 DIAGNOSIS — N039 Chronic nephritic syndrome with unspecified morphologic changes: Secondary | ICD-10-CM | POA: Diagnosis not present

## 2011-09-22 DIAGNOSIS — N2581 Secondary hyperparathyroidism of renal origin: Secondary | ICD-10-CM | POA: Diagnosis not present

## 2011-09-22 DIAGNOSIS — D509 Iron deficiency anemia, unspecified: Secondary | ICD-10-CM | POA: Diagnosis not present

## 2011-09-22 DIAGNOSIS — N186 End stage renal disease: Secondary | ICD-10-CM | POA: Diagnosis not present

## 2011-09-24 DIAGNOSIS — D509 Iron deficiency anemia, unspecified: Secondary | ICD-10-CM | POA: Diagnosis not present

## 2011-09-24 DIAGNOSIS — N186 End stage renal disease: Secondary | ICD-10-CM | POA: Diagnosis not present

## 2011-09-24 DIAGNOSIS — N2581 Secondary hyperparathyroidism of renal origin: Secondary | ICD-10-CM | POA: Diagnosis not present

## 2011-09-24 DIAGNOSIS — D631 Anemia in chronic kidney disease: Secondary | ICD-10-CM | POA: Diagnosis not present

## 2011-09-27 DIAGNOSIS — D509 Iron deficiency anemia, unspecified: Secondary | ICD-10-CM | POA: Diagnosis not present

## 2011-09-27 DIAGNOSIS — N2581 Secondary hyperparathyroidism of renal origin: Secondary | ICD-10-CM | POA: Diagnosis not present

## 2011-09-27 DIAGNOSIS — N186 End stage renal disease: Secondary | ICD-10-CM | POA: Diagnosis not present

## 2011-09-27 DIAGNOSIS — D631 Anemia in chronic kidney disease: Secondary | ICD-10-CM | POA: Diagnosis not present

## 2011-09-29 DIAGNOSIS — N2581 Secondary hyperparathyroidism of renal origin: Secondary | ICD-10-CM | POA: Diagnosis not present

## 2011-09-29 DIAGNOSIS — N039 Chronic nephritic syndrome with unspecified morphologic changes: Secondary | ICD-10-CM | POA: Diagnosis not present

## 2011-09-29 DIAGNOSIS — N186 End stage renal disease: Secondary | ICD-10-CM | POA: Diagnosis not present

## 2011-09-29 DIAGNOSIS — D509 Iron deficiency anemia, unspecified: Secondary | ICD-10-CM | POA: Diagnosis not present

## 2011-10-01 DIAGNOSIS — N2581 Secondary hyperparathyroidism of renal origin: Secondary | ICD-10-CM | POA: Diagnosis not present

## 2011-10-01 DIAGNOSIS — N039 Chronic nephritic syndrome with unspecified morphologic changes: Secondary | ICD-10-CM | POA: Diagnosis not present

## 2011-10-01 DIAGNOSIS — D509 Iron deficiency anemia, unspecified: Secondary | ICD-10-CM | POA: Diagnosis not present

## 2011-10-01 DIAGNOSIS — N186 End stage renal disease: Secondary | ICD-10-CM | POA: Diagnosis not present

## 2011-10-04 DIAGNOSIS — D509 Iron deficiency anemia, unspecified: Secondary | ICD-10-CM | POA: Diagnosis not present

## 2011-10-04 DIAGNOSIS — N186 End stage renal disease: Secondary | ICD-10-CM | POA: Diagnosis not present

## 2011-10-04 DIAGNOSIS — N2581 Secondary hyperparathyroidism of renal origin: Secondary | ICD-10-CM | POA: Diagnosis not present

## 2011-10-04 DIAGNOSIS — N039 Chronic nephritic syndrome with unspecified morphologic changes: Secondary | ICD-10-CM | POA: Diagnosis not present

## 2011-10-06 DIAGNOSIS — D509 Iron deficiency anemia, unspecified: Secondary | ICD-10-CM | POA: Diagnosis not present

## 2011-10-06 DIAGNOSIS — N2581 Secondary hyperparathyroidism of renal origin: Secondary | ICD-10-CM | POA: Diagnosis not present

## 2011-10-06 DIAGNOSIS — D631 Anemia in chronic kidney disease: Secondary | ICD-10-CM | POA: Diagnosis not present

## 2011-10-06 DIAGNOSIS — N186 End stage renal disease: Secondary | ICD-10-CM | POA: Diagnosis not present

## 2011-10-08 DIAGNOSIS — N2581 Secondary hyperparathyroidism of renal origin: Secondary | ICD-10-CM | POA: Diagnosis not present

## 2011-10-08 DIAGNOSIS — D509 Iron deficiency anemia, unspecified: Secondary | ICD-10-CM | POA: Diagnosis not present

## 2011-10-08 DIAGNOSIS — N039 Chronic nephritic syndrome with unspecified morphologic changes: Secondary | ICD-10-CM | POA: Diagnosis not present

## 2011-10-08 DIAGNOSIS — N186 End stage renal disease: Secondary | ICD-10-CM | POA: Diagnosis not present

## 2011-10-11 DIAGNOSIS — N186 End stage renal disease: Secondary | ICD-10-CM | POA: Diagnosis not present

## 2011-10-11 DIAGNOSIS — D509 Iron deficiency anemia, unspecified: Secondary | ICD-10-CM | POA: Diagnosis not present

## 2011-10-11 DIAGNOSIS — N2581 Secondary hyperparathyroidism of renal origin: Secondary | ICD-10-CM | POA: Diagnosis not present

## 2011-10-11 DIAGNOSIS — D631 Anemia in chronic kidney disease: Secondary | ICD-10-CM | POA: Diagnosis not present

## 2011-10-13 DIAGNOSIS — N186 End stage renal disease: Secondary | ICD-10-CM | POA: Diagnosis not present

## 2011-10-13 DIAGNOSIS — N039 Chronic nephritic syndrome with unspecified morphologic changes: Secondary | ICD-10-CM | POA: Diagnosis not present

## 2011-10-13 DIAGNOSIS — N2581 Secondary hyperparathyroidism of renal origin: Secondary | ICD-10-CM | POA: Diagnosis not present

## 2011-10-13 DIAGNOSIS — D509 Iron deficiency anemia, unspecified: Secondary | ICD-10-CM | POA: Diagnosis not present

## 2011-10-14 DIAGNOSIS — N186 End stage renal disease: Secondary | ICD-10-CM | POA: Diagnosis not present

## 2011-10-15 DIAGNOSIS — N039 Chronic nephritic syndrome with unspecified morphologic changes: Secondary | ICD-10-CM | POA: Diagnosis not present

## 2011-10-15 DIAGNOSIS — D509 Iron deficiency anemia, unspecified: Secondary | ICD-10-CM | POA: Diagnosis not present

## 2011-10-15 DIAGNOSIS — D631 Anemia in chronic kidney disease: Secondary | ICD-10-CM | POA: Diagnosis not present

## 2011-10-15 DIAGNOSIS — N2581 Secondary hyperparathyroidism of renal origin: Secondary | ICD-10-CM | POA: Diagnosis not present

## 2011-10-15 DIAGNOSIS — N186 End stage renal disease: Secondary | ICD-10-CM | POA: Diagnosis not present

## 2011-10-18 DIAGNOSIS — D509 Iron deficiency anemia, unspecified: Secondary | ICD-10-CM | POA: Diagnosis not present

## 2011-10-18 DIAGNOSIS — N039 Chronic nephritic syndrome with unspecified morphologic changes: Secondary | ICD-10-CM | POA: Diagnosis not present

## 2011-10-18 DIAGNOSIS — N2581 Secondary hyperparathyroidism of renal origin: Secondary | ICD-10-CM | POA: Diagnosis not present

## 2011-10-18 DIAGNOSIS — N186 End stage renal disease: Secondary | ICD-10-CM | POA: Diagnosis not present

## 2011-10-20 DIAGNOSIS — N186 End stage renal disease: Secondary | ICD-10-CM | POA: Diagnosis not present

## 2011-10-20 DIAGNOSIS — N039 Chronic nephritic syndrome with unspecified morphologic changes: Secondary | ICD-10-CM | POA: Diagnosis not present

## 2011-10-20 DIAGNOSIS — D509 Iron deficiency anemia, unspecified: Secondary | ICD-10-CM | POA: Diagnosis not present

## 2011-10-20 DIAGNOSIS — N2581 Secondary hyperparathyroidism of renal origin: Secondary | ICD-10-CM | POA: Diagnosis not present

## 2011-10-22 DIAGNOSIS — N2581 Secondary hyperparathyroidism of renal origin: Secondary | ICD-10-CM | POA: Diagnosis not present

## 2011-10-22 DIAGNOSIS — N186 End stage renal disease: Secondary | ICD-10-CM | POA: Diagnosis not present

## 2011-10-22 DIAGNOSIS — N039 Chronic nephritic syndrome with unspecified morphologic changes: Secondary | ICD-10-CM | POA: Diagnosis not present

## 2011-10-22 DIAGNOSIS — D509 Iron deficiency anemia, unspecified: Secondary | ICD-10-CM | POA: Diagnosis not present

## 2011-10-25 DIAGNOSIS — D631 Anemia in chronic kidney disease: Secondary | ICD-10-CM | POA: Diagnosis not present

## 2011-10-25 DIAGNOSIS — N2581 Secondary hyperparathyroidism of renal origin: Secondary | ICD-10-CM | POA: Diagnosis not present

## 2011-10-25 DIAGNOSIS — N186 End stage renal disease: Secondary | ICD-10-CM | POA: Diagnosis not present

## 2011-10-25 DIAGNOSIS — D509 Iron deficiency anemia, unspecified: Secondary | ICD-10-CM | POA: Diagnosis not present

## 2011-10-26 DIAGNOSIS — E119 Type 2 diabetes mellitus without complications: Secondary | ICD-10-CM | POA: Diagnosis not present

## 2011-10-26 DIAGNOSIS — E11359 Type 2 diabetes mellitus with proliferative diabetic retinopathy without macular edema: Secondary | ICD-10-CM | POA: Diagnosis not present

## 2011-10-27 DIAGNOSIS — N039 Chronic nephritic syndrome with unspecified morphologic changes: Secondary | ICD-10-CM | POA: Diagnosis not present

## 2011-10-27 DIAGNOSIS — D509 Iron deficiency anemia, unspecified: Secondary | ICD-10-CM | POA: Diagnosis not present

## 2011-10-27 DIAGNOSIS — N186 End stage renal disease: Secondary | ICD-10-CM | POA: Diagnosis not present

## 2011-10-27 DIAGNOSIS — N2581 Secondary hyperparathyroidism of renal origin: Secondary | ICD-10-CM | POA: Diagnosis not present

## 2011-10-29 DIAGNOSIS — N039 Chronic nephritic syndrome with unspecified morphologic changes: Secondary | ICD-10-CM | POA: Diagnosis not present

## 2011-10-29 DIAGNOSIS — N186 End stage renal disease: Secondary | ICD-10-CM | POA: Diagnosis not present

## 2011-10-29 DIAGNOSIS — N2581 Secondary hyperparathyroidism of renal origin: Secondary | ICD-10-CM | POA: Diagnosis not present

## 2011-10-29 DIAGNOSIS — D509 Iron deficiency anemia, unspecified: Secondary | ICD-10-CM | POA: Diagnosis not present

## 2011-11-01 DIAGNOSIS — N2581 Secondary hyperparathyroidism of renal origin: Secondary | ICD-10-CM | POA: Diagnosis not present

## 2011-11-01 DIAGNOSIS — N186 End stage renal disease: Secondary | ICD-10-CM | POA: Diagnosis not present

## 2011-11-01 DIAGNOSIS — D509 Iron deficiency anemia, unspecified: Secondary | ICD-10-CM | POA: Diagnosis not present

## 2011-11-01 DIAGNOSIS — D631 Anemia in chronic kidney disease: Secondary | ICD-10-CM | POA: Diagnosis not present

## 2011-11-03 DIAGNOSIS — D509 Iron deficiency anemia, unspecified: Secondary | ICD-10-CM | POA: Diagnosis not present

## 2011-11-03 DIAGNOSIS — D631 Anemia in chronic kidney disease: Secondary | ICD-10-CM | POA: Diagnosis not present

## 2011-11-03 DIAGNOSIS — N2581 Secondary hyperparathyroidism of renal origin: Secondary | ICD-10-CM | POA: Diagnosis not present

## 2011-11-03 DIAGNOSIS — N186 End stage renal disease: Secondary | ICD-10-CM | POA: Diagnosis not present

## 2011-11-05 DIAGNOSIS — D631 Anemia in chronic kidney disease: Secondary | ICD-10-CM | POA: Diagnosis not present

## 2011-11-05 DIAGNOSIS — N2581 Secondary hyperparathyroidism of renal origin: Secondary | ICD-10-CM | POA: Diagnosis not present

## 2011-11-05 DIAGNOSIS — D509 Iron deficiency anemia, unspecified: Secondary | ICD-10-CM | POA: Diagnosis not present

## 2011-11-05 DIAGNOSIS — N186 End stage renal disease: Secondary | ICD-10-CM | POA: Diagnosis not present

## 2011-11-08 DIAGNOSIS — D509 Iron deficiency anemia, unspecified: Secondary | ICD-10-CM | POA: Diagnosis not present

## 2011-11-08 DIAGNOSIS — N2581 Secondary hyperparathyroidism of renal origin: Secondary | ICD-10-CM | POA: Diagnosis not present

## 2011-11-08 DIAGNOSIS — D631 Anemia in chronic kidney disease: Secondary | ICD-10-CM | POA: Diagnosis not present

## 2011-11-08 DIAGNOSIS — N186 End stage renal disease: Secondary | ICD-10-CM | POA: Diagnosis not present

## 2011-11-10 DIAGNOSIS — N2581 Secondary hyperparathyroidism of renal origin: Secondary | ICD-10-CM | POA: Diagnosis not present

## 2011-11-10 DIAGNOSIS — N186 End stage renal disease: Secondary | ICD-10-CM | POA: Diagnosis not present

## 2011-11-10 DIAGNOSIS — D509 Iron deficiency anemia, unspecified: Secondary | ICD-10-CM | POA: Diagnosis not present

## 2011-11-10 DIAGNOSIS — D631 Anemia in chronic kidney disease: Secondary | ICD-10-CM | POA: Diagnosis not present

## 2011-11-12 DIAGNOSIS — N039 Chronic nephritic syndrome with unspecified morphologic changes: Secondary | ICD-10-CM | POA: Diagnosis not present

## 2011-11-12 DIAGNOSIS — D509 Iron deficiency anemia, unspecified: Secondary | ICD-10-CM | POA: Diagnosis not present

## 2011-11-12 DIAGNOSIS — N2581 Secondary hyperparathyroidism of renal origin: Secondary | ICD-10-CM | POA: Diagnosis not present

## 2011-11-12 DIAGNOSIS — N186 End stage renal disease: Secondary | ICD-10-CM | POA: Diagnosis not present

## 2011-11-14 DIAGNOSIS — N186 End stage renal disease: Secondary | ICD-10-CM | POA: Diagnosis not present

## 2011-11-15 DIAGNOSIS — N186 End stage renal disease: Secondary | ICD-10-CM | POA: Diagnosis not present

## 2011-11-15 DIAGNOSIS — N2581 Secondary hyperparathyroidism of renal origin: Secondary | ICD-10-CM | POA: Diagnosis not present

## 2011-11-15 DIAGNOSIS — D631 Anemia in chronic kidney disease: Secondary | ICD-10-CM | POA: Diagnosis not present

## 2011-11-15 DIAGNOSIS — D509 Iron deficiency anemia, unspecified: Secondary | ICD-10-CM | POA: Diagnosis not present

## 2011-12-08 DIAGNOSIS — N186 End stage renal disease: Secondary | ICD-10-CM | POA: Diagnosis not present

## 2011-12-08 DIAGNOSIS — E119 Type 2 diabetes mellitus without complications: Secondary | ICD-10-CM | POA: Diagnosis not present

## 2011-12-14 DIAGNOSIS — N186 End stage renal disease: Secondary | ICD-10-CM | POA: Diagnosis not present

## 2011-12-15 DIAGNOSIS — N186 End stage renal disease: Secondary | ICD-10-CM | POA: Diagnosis not present

## 2011-12-15 DIAGNOSIS — D509 Iron deficiency anemia, unspecified: Secondary | ICD-10-CM | POA: Diagnosis not present

## 2011-12-15 DIAGNOSIS — N2581 Secondary hyperparathyroidism of renal origin: Secondary | ICD-10-CM | POA: Diagnosis not present

## 2011-12-15 DIAGNOSIS — D631 Anemia in chronic kidney disease: Secondary | ICD-10-CM | POA: Diagnosis not present

## 2011-12-29 ENCOUNTER — Other Ambulatory Visit (HOSPITAL_COMMUNITY): Payer: Self-pay | Admitting: Nephrology

## 2011-12-29 DIAGNOSIS — N186 End stage renal disease: Secondary | ICD-10-CM

## 2012-01-04 ENCOUNTER — Other Ambulatory Visit (HOSPITAL_COMMUNITY): Payer: Self-pay | Admitting: Nephrology

## 2012-01-04 ENCOUNTER — Ambulatory Visit (HOSPITAL_COMMUNITY)
Admission: RE | Admit: 2012-01-04 | Discharge: 2012-01-04 | Disposition: A | Discharge status: Home / Self Care | Payer: Medicare Other | Source: Ambulatory Visit | Attending: Nephrology | Admitting: Nephrology

## 2012-01-04 DIAGNOSIS — N186 End stage renal disease: Secondary | ICD-10-CM

## 2012-01-04 DIAGNOSIS — Y832 Surgical operation with anastomosis, bypass or graft as the cause of abnormal reaction of the patient, or of later complication, without mention of misadventure at the time of the procedure: Secondary | ICD-10-CM | POA: Insufficient documentation

## 2012-01-04 DIAGNOSIS — T82898A Other specified complication of vascular prosthetic devices, implants and grafts, initial encounter: Secondary | ICD-10-CM | POA: Diagnosis not present

## 2012-01-04 MED ORDER — IOHEXOL 300 MG/ML  SOLN
100.0000 mL | Freq: Once | INTRAMUSCULAR | Status: AC | PRN
  Administered 2012-01-04: 80 mL via INTRAVENOUS

## 2012-01-04 NOTE — H&P (Signed)
[name] is an 41 y.o. male.   Chief Complaint: Decreased access flows from left arm fistula HPI: ESRD with history of central cephalic vein stenosis and previous angioplasty.  Presents for a fistulogram to evaluate decreased access flows.  Patient has no complaints.  Past Medical History  Diagnosis Date  . Non-ischemic cardiomyopathy     EF 20-25% 12/23/2008>>EF 40-45% 03/2009>>EF ??? 7/?/2011  . ESRD (end stage renal disease) on dialysis     1.Initiated HD via right per cate 12/30/2008 (MWF schedule at [facility]) 2.Left upper extremity A-V fistula by Dr Oneida Alar 12/30/2008 3.Aemia: Received Venofer 12/30/08 Ferritin 179 % sat 12 12/23/08, 4. Secondary Hyperparathyroidism- PTH 188, P 5.4, Ca 9.2 12/2008  . 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 Hypertension     resloved after initating HD, now with post HD hypotension  . Hemodialysis-associated hypotension     coreg d/c'd  . Anemia     Past Surgical History  Procedure Date  . Cataract extraction, bilateral   . Av fistula placement     left  . Photocoagulation     for diabetic retinopathy 08/1999    Family History  Problem Relation Age of Onset  . Hypertension Mother   . Diabetes Father   . Kidney disease Father   . Coronary artery disease Neg Hx     no hx of CHF, sudden cardiac death, or CAD   Social History:  reports that he has never smoked. He does not have any smokeless tobacco history on file. He reports that he does not drink alcohol or use illicit drugs.  Allergies: No Known Allergies   (Not in a hospital admission)  No results found for this or any previous visit (from the past 48 hour(s)). No results found.  Review of Systems  Constitutional: Negative.   Respiratory: Negative.   Cardiovascular: Negative.     There were no vitals taken for this visit. Physical Exam    Cardiovascular: Normal rate, regular rhythm and normal heart sounds.   Respiratory: Effort normal and breath sounds normal.  Left arm:  Fistula is patent.  Strong left radial pulse.  Assessment/Plan ESRD with decreased access flows.  Fistulogram demonstrates recurrent stenosis in left central cephalic vein.  Plan for angioplasty and possible stent placement.  Procedure explained to patient and informed consent obtained.    Toron Bowring RYAN 01/04/2012, 8:02 AM

## 2012-01-04 NOTE — Procedures (Signed)
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  Stenosis was successfully treated with 7 mm balloon angioplasty.  No immediate complication.

## 2012-01-14 DIAGNOSIS — N186 End stage renal disease: Secondary | ICD-10-CM | POA: Diagnosis not present

## 2012-01-17 DIAGNOSIS — D509 Iron deficiency anemia, unspecified: Secondary | ICD-10-CM | POA: Diagnosis not present

## 2012-01-17 DIAGNOSIS — N186 End stage renal disease: Secondary | ICD-10-CM | POA: Diagnosis not present

## 2012-01-17 DIAGNOSIS — D631 Anemia in chronic kidney disease: Secondary | ICD-10-CM | POA: Diagnosis not present

## 2012-01-17 DIAGNOSIS — N2581 Secondary hyperparathyroidism of renal origin: Secondary | ICD-10-CM | POA: Diagnosis not present

## 2012-01-19 DIAGNOSIS — D509 Iron deficiency anemia, unspecified: Secondary | ICD-10-CM | POA: Diagnosis not present

## 2012-01-19 DIAGNOSIS — D631 Anemia in chronic kidney disease: Secondary | ICD-10-CM | POA: Diagnosis not present

## 2012-01-19 DIAGNOSIS — N186 End stage renal disease: Secondary | ICD-10-CM | POA: Diagnosis not present

## 2012-01-19 DIAGNOSIS — N2581 Secondary hyperparathyroidism of renal origin: Secondary | ICD-10-CM | POA: Diagnosis not present

## 2012-01-20 DIAGNOSIS — I509 Heart failure, unspecified: Secondary | ICD-10-CM

## 2012-01-21 DIAGNOSIS — N186 End stage renal disease: Secondary | ICD-10-CM | POA: Diagnosis not present

## 2012-01-21 DIAGNOSIS — D509 Iron deficiency anemia, unspecified: Secondary | ICD-10-CM | POA: Diagnosis not present

## 2012-01-21 DIAGNOSIS — D631 Anemia in chronic kidney disease: Secondary | ICD-10-CM | POA: Diagnosis not present

## 2012-01-21 DIAGNOSIS — N2581 Secondary hyperparathyroidism of renal origin: Secondary | ICD-10-CM | POA: Diagnosis not present

## 2012-01-24 DIAGNOSIS — D631 Anemia in chronic kidney disease: Secondary | ICD-10-CM | POA: Diagnosis not present

## 2012-01-24 DIAGNOSIS — N186 End stage renal disease: Secondary | ICD-10-CM | POA: Diagnosis not present

## 2012-01-24 DIAGNOSIS — N2581 Secondary hyperparathyroidism of renal origin: Secondary | ICD-10-CM | POA: Diagnosis not present

## 2012-01-24 DIAGNOSIS — D509 Iron deficiency anemia, unspecified: Secondary | ICD-10-CM | POA: Diagnosis not present

## 2012-01-26 DIAGNOSIS — N186 End stage renal disease: Secondary | ICD-10-CM | POA: Diagnosis not present

## 2012-01-26 DIAGNOSIS — D631 Anemia in chronic kidney disease: Secondary | ICD-10-CM | POA: Diagnosis not present

## 2012-01-26 DIAGNOSIS — N2581 Secondary hyperparathyroidism of renal origin: Secondary | ICD-10-CM | POA: Diagnosis not present

## 2012-01-26 DIAGNOSIS — D509 Iron deficiency anemia, unspecified: Secondary | ICD-10-CM | POA: Diagnosis not present

## 2012-01-28 DIAGNOSIS — D509 Iron deficiency anemia, unspecified: Secondary | ICD-10-CM | POA: Diagnosis not present

## 2012-01-28 DIAGNOSIS — N039 Chronic nephritic syndrome with unspecified morphologic changes: Secondary | ICD-10-CM | POA: Diagnosis not present

## 2012-01-28 DIAGNOSIS — N2581 Secondary hyperparathyroidism of renal origin: Secondary | ICD-10-CM | POA: Diagnosis not present

## 2012-01-28 DIAGNOSIS — N186 End stage renal disease: Secondary | ICD-10-CM | POA: Diagnosis not present

## 2012-01-31 DIAGNOSIS — D631 Anemia in chronic kidney disease: Secondary | ICD-10-CM | POA: Diagnosis not present

## 2012-01-31 DIAGNOSIS — N186 End stage renal disease: Secondary | ICD-10-CM | POA: Diagnosis not present

## 2012-01-31 DIAGNOSIS — D509 Iron deficiency anemia, unspecified: Secondary | ICD-10-CM | POA: Diagnosis not present

## 2012-01-31 DIAGNOSIS — N2581 Secondary hyperparathyroidism of renal origin: Secondary | ICD-10-CM | POA: Diagnosis not present

## 2012-02-02 DIAGNOSIS — N039 Chronic nephritic syndrome with unspecified morphologic changes: Secondary | ICD-10-CM | POA: Diagnosis not present

## 2012-02-02 DIAGNOSIS — N2581 Secondary hyperparathyroidism of renal origin: Secondary | ICD-10-CM | POA: Diagnosis not present

## 2012-02-02 DIAGNOSIS — D509 Iron deficiency anemia, unspecified: Secondary | ICD-10-CM | POA: Diagnosis not present

## 2012-02-02 DIAGNOSIS — N186 End stage renal disease: Secondary | ICD-10-CM | POA: Diagnosis not present

## 2012-02-04 DIAGNOSIS — D509 Iron deficiency anemia, unspecified: Secondary | ICD-10-CM | POA: Diagnosis not present

## 2012-02-04 DIAGNOSIS — N186 End stage renal disease: Secondary | ICD-10-CM | POA: Diagnosis not present

## 2012-02-04 DIAGNOSIS — D631 Anemia in chronic kidney disease: Secondary | ICD-10-CM | POA: Diagnosis not present

## 2012-02-04 DIAGNOSIS — N2581 Secondary hyperparathyroidism of renal origin: Secondary | ICD-10-CM | POA: Diagnosis not present

## 2012-02-07 DIAGNOSIS — N2581 Secondary hyperparathyroidism of renal origin: Secondary | ICD-10-CM | POA: Diagnosis not present

## 2012-02-07 DIAGNOSIS — D509 Iron deficiency anemia, unspecified: Secondary | ICD-10-CM | POA: Diagnosis not present

## 2012-02-07 DIAGNOSIS — N186 End stage renal disease: Secondary | ICD-10-CM | POA: Diagnosis not present

## 2012-02-07 DIAGNOSIS — N039 Chronic nephritic syndrome with unspecified morphologic changes: Secondary | ICD-10-CM | POA: Diagnosis not present

## 2012-02-09 DIAGNOSIS — D509 Iron deficiency anemia, unspecified: Secondary | ICD-10-CM | POA: Diagnosis not present

## 2012-02-09 DIAGNOSIS — N186 End stage renal disease: Secondary | ICD-10-CM | POA: Diagnosis not present

## 2012-02-09 DIAGNOSIS — D631 Anemia in chronic kidney disease: Secondary | ICD-10-CM | POA: Diagnosis not present

## 2012-02-09 DIAGNOSIS — N2581 Secondary hyperparathyroidism of renal origin: Secondary | ICD-10-CM | POA: Diagnosis not present

## 2012-02-11 DIAGNOSIS — N2581 Secondary hyperparathyroidism of renal origin: Secondary | ICD-10-CM | POA: Diagnosis not present

## 2012-02-11 DIAGNOSIS — N039 Chronic nephritic syndrome with unspecified morphologic changes: Secondary | ICD-10-CM | POA: Diagnosis not present

## 2012-02-11 DIAGNOSIS — D509 Iron deficiency anemia, unspecified: Secondary | ICD-10-CM | POA: Diagnosis not present

## 2012-02-11 DIAGNOSIS — N186 End stage renal disease: Secondary | ICD-10-CM | POA: Diagnosis not present

## 2012-02-13 DIAGNOSIS — N186 End stage renal disease: Secondary | ICD-10-CM | POA: Diagnosis not present

## 2012-02-14 DIAGNOSIS — N186 End stage renal disease: Secondary | ICD-10-CM | POA: Diagnosis not present

## 2012-02-14 DIAGNOSIS — D509 Iron deficiency anemia, unspecified: Secondary | ICD-10-CM | POA: Diagnosis not present

## 2012-02-14 DIAGNOSIS — D631 Anemia in chronic kidney disease: Secondary | ICD-10-CM | POA: Diagnosis not present

## 2012-02-14 DIAGNOSIS — N2581 Secondary hyperparathyroidism of renal origin: Secondary | ICD-10-CM | POA: Diagnosis not present

## 2012-02-22 ENCOUNTER — Ambulatory Visit (INDEPENDENT_AMBULATORY_CARE_PROVIDER_SITE_OTHER): Payer: Medicare Other | Admitting: Cardiology

## 2012-02-22 ENCOUNTER — Encounter: Payer: Self-pay | Admitting: Cardiology

## 2012-02-22 VITALS — BP 135/89 | HR 76 | Wt 166.0 lb

## 2012-02-22 DIAGNOSIS — I428 Other cardiomyopathies: Secondary | ICD-10-CM

## 2012-02-22 DIAGNOSIS — I10 Essential (primary) hypertension: Secondary | ICD-10-CM

## 2012-02-22 NOTE — Progress Notes (Signed)
HPI: Pleasant male for f/u of cardiomyopathy. Echo in July of 2011 revealed an ejection fraction of 30-35%, trace MR and TR. Previous cath in 2010 revealed normal coronary arteries. A TSH, ferritin, HIV, and ACE level were normal. A rheumatoid factor and ANA were also normal. He was treated medically for nonischemic cardiomyopathy. Patient also with h/o renal insufficiency felt secondary to hypertension and DM. Echocardiogram repeated in July of 2012 and showed an ejection fraction of 50-55%. I last saw him in June of 2012. Since then, the patient denies any dyspnea on exertion, orthopnea, PND, pedal edema, palpitations, syncope or chest pain.    Current Outpatient Prescriptions  Medication Sig Dispense Refill  . Ascorbic Acid (VITAMIN C) 500 MG tablet Take 500 mg by mouth daily.        Marland Kitchen aspirin 81 MG tablet Take 81 mg by mouth daily.       . B Complex-C-Folic Acid (RENA-VITE PO) Take 1 tablet by mouth daily.      . calcium carbonate (TUMS - DOSED IN MG ELEMENTAL CALCIUM) 500 MG chewable tablet Chew 1 tablet by mouth daily.      . carvedilol (COREG) 6.25 MG tablet Take 1 tablet (6.25 mg total) by mouth 2 (two) times daily.  180 tablet  4  . docusate sodium (DULCOLAX) 100 MG capsule Take 100 mg by mouth as needed.        Marland Kitchen epoetin alfa (EPOGEN) 3000 UNIT/ML injection Take 6000 units IV with HD       . glipiZIDE (GLUCOTROL) 2.5 MG 24 hr tablet Take 2.5 mg by mouth daily.        Marland Kitchen glucose blood (PRODIGY TEST) test strip Use to test blood sugar level three times daily before meals       . Lancets MISC Use to test blood glucose three times a day       . Multiple Vitamin (RENAL MULTIVITAMIN/ZINC) TABS Take 1 tablet by mouth daily.        . promethazine (PHENERGAN) 25 MG tablet Take 1 tablet by mouth for nausea during your dialysis          Past Medical History  Diagnosis Date  . Non-ischemic cardiomyopathy     EF 20-25% 12/23/2008>>EF 40-45% 03/2009>>EF ??? 7/?/2011  . ESRD (end stage renal  disease) on dialysis     1.Initiated HD via right per cate 12/30/2008 (MWF schedule at [facility]) 2.Left upper extremity A-V fistula by Dr Oneida Alar 12/30/2008 3.Aemia: Received Venofer 12/30/08 Ferritin 179 % sat 12 12/23/08, 4. Secondary Hyperparathyroidism- PTH 188, P 5.4, Ca 9.2 12/2008  . 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 Hypertension     resloved after initating HD, now with post HD hypotension  . Hemodialysis-associated hypotension     coreg d/c'd  . Anemia     Past Surgical History  Procedure Date  . Cataract extraction, bilateral   . Av fistula placement     left  . Photocoagulation     for diabetic retinopathy 08/1999    History   Social History  . Marital Status: Married    Spouse Name: N/A    Number of Children: N/A  . Years of Education: N/A   Occupational History  . Not on file.   Social History Main Topics  . Smoking status: Never Smoker   . Smokeless  tobacco: Not on file  . Alcohol Use: No  . Drug Use: No  . Sexually Active: Not on file   Other Topics Concern  . Not on file   Social History Narrative   Has two children. No risky sexual behavior.    ROS: no fevers or chills, productive cough, hemoptysis, dysphasia, odynophagia, melena, hematochezia, dysuria, hematuria, rash, seizure activity, orthopnea, PND, pedal edema, claudication. Remaining systems are negative.  Physical Exam: Well-developed well-nourished in no acute distress.  Skin is warm and dry.  HEENT is normal.  Neck is supple.  Chest is clear to auscultation with normal expansion.  Cardiovascular exam is regular rate and rhythm.  Abdominal exam nontender or distended. No masses palpated. Extremities show no edema. AV fistula left upper extremity. neuro grossly intact  ECG sinus rhythm at a rate of 76. No ST changes.

## 2012-02-22 NOTE — Patient Instructions (Addendum)
Your physician wants you to follow-up in: ONE YEAR WITH DR CRENSHAW You will receive a reminder letter in the mail two months in advance. If you don't receive a letter, please call our office to schedule the follow-up appointment.  

## 2012-02-22 NOTE — Assessment & Plan Note (Signed)
LV function has improved on most recent echocardiogram. Continue carvedilol. Plan repeat echocardiogram when he returns in one year.

## 2012-02-22 NOTE — Assessment & Plan Note (Signed)
Blood pressure is controlled. Continue carvedilol. He continues to have problems with hypotension on dialysis days and I therefore will not advance his medications.

## 2012-02-29 DIAGNOSIS — Z01818 Encounter for other preprocedural examination: Secondary | ICD-10-CM | POA: Diagnosis not present

## 2012-02-29 DIAGNOSIS — N189 Chronic kidney disease, unspecified: Secondary | ICD-10-CM | POA: Diagnosis not present

## 2012-02-29 DIAGNOSIS — I509 Heart failure, unspecified: Secondary | ICD-10-CM | POA: Diagnosis not present

## 2012-03-08 DIAGNOSIS — N186 End stage renal disease: Secondary | ICD-10-CM | POA: Diagnosis not present

## 2012-03-08 DIAGNOSIS — E119 Type 2 diabetes mellitus without complications: Secondary | ICD-10-CM | POA: Diagnosis not present

## 2012-03-15 DIAGNOSIS — N186 End stage renal disease: Secondary | ICD-10-CM | POA: Diagnosis not present

## 2012-03-17 DIAGNOSIS — N186 End stage renal disease: Secondary | ICD-10-CM | POA: Diagnosis not present

## 2012-03-17 DIAGNOSIS — N2581 Secondary hyperparathyroidism of renal origin: Secondary | ICD-10-CM | POA: Diagnosis not present

## 2012-03-17 DIAGNOSIS — D509 Iron deficiency anemia, unspecified: Secondary | ICD-10-CM | POA: Diagnosis not present

## 2012-04-15 DIAGNOSIS — N186 End stage renal disease: Secondary | ICD-10-CM | POA: Diagnosis not present

## 2012-04-17 DIAGNOSIS — D509 Iron deficiency anemia, unspecified: Secondary | ICD-10-CM | POA: Diagnosis not present

## 2012-04-17 DIAGNOSIS — N186 End stage renal disease: Secondary | ICD-10-CM | POA: Diagnosis not present

## 2012-04-17 DIAGNOSIS — N2581 Secondary hyperparathyroidism of renal origin: Secondary | ICD-10-CM | POA: Diagnosis not present

## 2012-04-17 DIAGNOSIS — Z23 Encounter for immunization: Secondary | ICD-10-CM | POA: Diagnosis not present

## 2012-04-19 DIAGNOSIS — Z23 Encounter for immunization: Secondary | ICD-10-CM | POA: Diagnosis not present

## 2012-04-19 DIAGNOSIS — N2581 Secondary hyperparathyroidism of renal origin: Secondary | ICD-10-CM | POA: Diagnosis not present

## 2012-04-19 DIAGNOSIS — N186 End stage renal disease: Secondary | ICD-10-CM | POA: Diagnosis not present

## 2012-04-19 DIAGNOSIS — D509 Iron deficiency anemia, unspecified: Secondary | ICD-10-CM | POA: Diagnosis not present

## 2012-04-21 DIAGNOSIS — N2581 Secondary hyperparathyroidism of renal origin: Secondary | ICD-10-CM | POA: Diagnosis not present

## 2012-04-21 DIAGNOSIS — Z23 Encounter for immunization: Secondary | ICD-10-CM | POA: Diagnosis not present

## 2012-04-21 DIAGNOSIS — D509 Iron deficiency anemia, unspecified: Secondary | ICD-10-CM | POA: Diagnosis not present

## 2012-04-21 DIAGNOSIS — N186 End stage renal disease: Secondary | ICD-10-CM | POA: Diagnosis not present

## 2012-04-24 DIAGNOSIS — N2581 Secondary hyperparathyroidism of renal origin: Secondary | ICD-10-CM | POA: Diagnosis not present

## 2012-04-24 DIAGNOSIS — D509 Iron deficiency anemia, unspecified: Secondary | ICD-10-CM | POA: Diagnosis not present

## 2012-04-24 DIAGNOSIS — N186 End stage renal disease: Secondary | ICD-10-CM | POA: Diagnosis not present

## 2012-04-24 DIAGNOSIS — Z23 Encounter for immunization: Secondary | ICD-10-CM | POA: Diagnosis not present

## 2012-04-26 DIAGNOSIS — N186 End stage renal disease: Secondary | ICD-10-CM | POA: Diagnosis not present

## 2012-04-26 DIAGNOSIS — D509 Iron deficiency anemia, unspecified: Secondary | ICD-10-CM | POA: Diagnosis not present

## 2012-04-26 DIAGNOSIS — N2581 Secondary hyperparathyroidism of renal origin: Secondary | ICD-10-CM | POA: Diagnosis not present

## 2012-04-26 DIAGNOSIS — Z23 Encounter for immunization: Secondary | ICD-10-CM | POA: Diagnosis not present

## 2012-04-28 DIAGNOSIS — Z23 Encounter for immunization: Secondary | ICD-10-CM | POA: Diagnosis not present

## 2012-04-28 DIAGNOSIS — D509 Iron deficiency anemia, unspecified: Secondary | ICD-10-CM | POA: Diagnosis not present

## 2012-04-28 DIAGNOSIS — N186 End stage renal disease: Secondary | ICD-10-CM | POA: Diagnosis not present

## 2012-04-28 DIAGNOSIS — N2581 Secondary hyperparathyroidism of renal origin: Secondary | ICD-10-CM | POA: Diagnosis not present

## 2012-05-01 DIAGNOSIS — N186 End stage renal disease: Secondary | ICD-10-CM | POA: Diagnosis not present

## 2012-05-01 DIAGNOSIS — D509 Iron deficiency anemia, unspecified: Secondary | ICD-10-CM | POA: Diagnosis not present

## 2012-05-01 DIAGNOSIS — N2581 Secondary hyperparathyroidism of renal origin: Secondary | ICD-10-CM | POA: Diagnosis not present

## 2012-05-01 DIAGNOSIS — Z23 Encounter for immunization: Secondary | ICD-10-CM | POA: Diagnosis not present

## 2012-05-03 DIAGNOSIS — D509 Iron deficiency anemia, unspecified: Secondary | ICD-10-CM | POA: Diagnosis not present

## 2012-05-03 DIAGNOSIS — Z23 Encounter for immunization: Secondary | ICD-10-CM | POA: Diagnosis not present

## 2012-05-03 DIAGNOSIS — N186 End stage renal disease: Secondary | ICD-10-CM | POA: Diagnosis not present

## 2012-05-03 DIAGNOSIS — N2581 Secondary hyperparathyroidism of renal origin: Secondary | ICD-10-CM | POA: Diagnosis not present

## 2012-05-05 DIAGNOSIS — N2581 Secondary hyperparathyroidism of renal origin: Secondary | ICD-10-CM | POA: Diagnosis not present

## 2012-05-05 DIAGNOSIS — N186 End stage renal disease: Secondary | ICD-10-CM | POA: Diagnosis not present

## 2012-05-05 DIAGNOSIS — Z23 Encounter for immunization: Secondary | ICD-10-CM | POA: Diagnosis not present

## 2012-05-05 DIAGNOSIS — D509 Iron deficiency anemia, unspecified: Secondary | ICD-10-CM | POA: Diagnosis not present

## 2012-05-08 DIAGNOSIS — D509 Iron deficiency anemia, unspecified: Secondary | ICD-10-CM | POA: Diagnosis not present

## 2012-05-08 DIAGNOSIS — Z23 Encounter for immunization: Secondary | ICD-10-CM | POA: Diagnosis not present

## 2012-05-08 DIAGNOSIS — N2581 Secondary hyperparathyroidism of renal origin: Secondary | ICD-10-CM | POA: Diagnosis not present

## 2012-05-08 DIAGNOSIS — N186 End stage renal disease: Secondary | ICD-10-CM | POA: Diagnosis not present

## 2012-05-10 DIAGNOSIS — N186 End stage renal disease: Secondary | ICD-10-CM | POA: Diagnosis not present

## 2012-05-10 DIAGNOSIS — N2581 Secondary hyperparathyroidism of renal origin: Secondary | ICD-10-CM | POA: Diagnosis not present

## 2012-05-10 DIAGNOSIS — Z23 Encounter for immunization: Secondary | ICD-10-CM | POA: Diagnosis not present

## 2012-05-10 DIAGNOSIS — D509 Iron deficiency anemia, unspecified: Secondary | ICD-10-CM | POA: Diagnosis not present

## 2012-05-12 DIAGNOSIS — D509 Iron deficiency anemia, unspecified: Secondary | ICD-10-CM | POA: Diagnosis not present

## 2012-05-12 DIAGNOSIS — Z23 Encounter for immunization: Secondary | ICD-10-CM | POA: Diagnosis not present

## 2012-05-12 DIAGNOSIS — N2581 Secondary hyperparathyroidism of renal origin: Secondary | ICD-10-CM | POA: Diagnosis not present

## 2012-05-12 DIAGNOSIS — N186 End stage renal disease: Secondary | ICD-10-CM | POA: Diagnosis not present

## 2012-05-15 DIAGNOSIS — N2581 Secondary hyperparathyroidism of renal origin: Secondary | ICD-10-CM | POA: Diagnosis not present

## 2012-05-15 DIAGNOSIS — Z23 Encounter for immunization: Secondary | ICD-10-CM | POA: Diagnosis not present

## 2012-05-15 DIAGNOSIS — D509 Iron deficiency anemia, unspecified: Secondary | ICD-10-CM | POA: Diagnosis not present

## 2012-05-15 DIAGNOSIS — N186 End stage renal disease: Secondary | ICD-10-CM | POA: Diagnosis not present

## 2012-05-16 DIAGNOSIS — E11359 Type 2 diabetes mellitus with proliferative diabetic retinopathy without macular edema: Secondary | ICD-10-CM | POA: Diagnosis not present

## 2012-05-16 DIAGNOSIS — E1139 Type 2 diabetes mellitus with other diabetic ophthalmic complication: Secondary | ICD-10-CM | POA: Diagnosis not present

## 2012-05-16 DIAGNOSIS — E11311 Type 2 diabetes mellitus with unspecified diabetic retinopathy with macular edema: Secondary | ICD-10-CM | POA: Diagnosis not present

## 2012-05-16 DIAGNOSIS — H4310 Vitreous hemorrhage, unspecified eye: Secondary | ICD-10-CM | POA: Diagnosis not present

## 2012-05-16 DIAGNOSIS — E119 Type 2 diabetes mellitus without complications: Secondary | ICD-10-CM | POA: Diagnosis not present

## 2012-05-17 DIAGNOSIS — Z992 Dependence on renal dialysis: Secondary | ICD-10-CM | POA: Diagnosis not present

## 2012-05-17 DIAGNOSIS — N2581 Secondary hyperparathyroidism of renal origin: Secondary | ICD-10-CM | POA: Diagnosis not present

## 2012-05-17 DIAGNOSIS — N186 End stage renal disease: Secondary | ICD-10-CM | POA: Diagnosis not present

## 2012-05-17 DIAGNOSIS — D631 Anemia in chronic kidney disease: Secondary | ICD-10-CM | POA: Diagnosis not present

## 2012-05-17 DIAGNOSIS — D509 Iron deficiency anemia, unspecified: Secondary | ICD-10-CM | POA: Diagnosis not present

## 2012-06-06 ENCOUNTER — Other Ambulatory Visit: Payer: Self-pay | Admitting: Internal Medicine

## 2012-06-06 ENCOUNTER — Encounter: Payer: Self-pay | Admitting: Internal Medicine

## 2012-06-06 ENCOUNTER — Ambulatory Visit (INDEPENDENT_AMBULATORY_CARE_PROVIDER_SITE_OTHER): Payer: Medicare Other | Admitting: Internal Medicine

## 2012-06-06 VITALS — BP 109/74 | HR 76 | Temp 98.1°F | Ht 65.0 in | Wt 168.3 lb

## 2012-06-06 DIAGNOSIS — I10 Essential (primary) hypertension: Secondary | ICD-10-CM | POA: Diagnosis not present

## 2012-06-06 DIAGNOSIS — E118 Type 2 diabetes mellitus with unspecified complications: Secondary | ICD-10-CM

## 2012-06-06 DIAGNOSIS — E1165 Type 2 diabetes mellitus with hyperglycemia: Secondary | ICD-10-CM | POA: Diagnosis not present

## 2012-06-06 DIAGNOSIS — B353 Tinea pedis: Secondary | ICD-10-CM | POA: Diagnosis not present

## 2012-06-06 LAB — CBC WITH DIFFERENTIAL/PLATELET
Basophils Absolute: 0.1 10*3/uL (ref 0.0–0.1)
HCT: 33.6 % — ABNORMAL LOW (ref 39.0–52.0)
Hemoglobin: 11.1 g/dL — ABNORMAL LOW (ref 13.0–17.0)
Lymphocytes Relative: 37 % (ref 12–46)
Monocytes Absolute: 0.6 10*3/uL (ref 0.1–1.0)
Neutro Abs: 3.2 10*3/uL (ref 1.7–7.7)
Neutrophils Relative %: 50 % (ref 43–77)
RDW: 14 % (ref 11.5–15.5)
WBC: 6.4 10*3/uL (ref 4.0–10.5)

## 2012-06-06 MED ORDER — LANCETS 30G MISC: Status: DC

## 2012-06-06 MED ORDER — FREESTYLE FREEDOM LITE W/DEVICE KIT: PACK | Status: DC

## 2012-06-06 MED ORDER — CLOTRIMAZOLE 1 % EX CREA: TOPICAL_CREAM | Freq: Two times a day (BID) | CUTANEOUS | Status: DC

## 2012-06-06 MED ORDER — GLUCOSE BLOOD VI STRP: ORAL_STRIP | Status: DC

## 2012-06-06 MED ORDER — GLIPIZIDE ER 5 MG PO TB24: 5.0000 mg | ORAL_TABLET | Freq: Every day | ORAL | Status: DC

## 2012-06-06 NOTE — Progress Notes (Signed)
HPI The patient is a 41 y.o. yo male with a history of DM, ESRD on HD, HTN, presenting for a follow-up visit.  The patient has a history of DM.  He notes that his glucometer broke last year, and he hasn't been checking his blood sugar since that time.  However, he notes no blurry vision, diaphoresis, tremulousness, or AMS.  He notes no significant change in his diet or exercise regimen.  He does note some increased stress lately related to his sister moving.  He has been taking glipizide 2.5 mg daily, and notes full compliance with this medication.  He notes that he was diagnosed with DM in 2010, and has been managed with only oral hypoglycemics since that time.  The patient notes a blister on his right 1st toe, which developed about 2 weeks ago when the patient was walking with wet shoes in the rain.  The blister has now resolved into a red discoloration of his skin, which is not painful.  He no longer has diabetic shoes, as his previous pair now has holes in it from overuse.  He notes that he does foot checks regularly at home.  The patient has no history of diabetic neuropathy, but notes a few-month history of bilateral foot "tingling" and "itching"  ROS: General: no fevers, chills, changes in weight, changes in appetite Skin: no rash HEENT: no blurry vision, hearing changes, sore throat Pulm: no dyspnea, coughing, wheezing CV: no chest pain, palpitations, shortness of breath Abd: no abdominal pain, nausea/vomiting, diarrhea/constipation GU: no dysuria, hematuria, polyuria Ext: no arthralgias, myalgias Neuro: no weakness, numbness, or tingling  Filed Vitals:   06/06/12 1017  BP: 109/74  Pulse: 76  Temp: 98.1 F (36.7 C)    PEX General: alert, cooperative, and in no apparent distress HEENT: pupils equal round and reactive to light, vision grossly intact, oropharynx clear and non-erythematous  Neck: supple, no lymphadenopathy Lungs: clear to ascultation bilaterally, normal work of  respiration, no wheezes, rales, ronchi Heart: regular rate and rhythm, no murmurs, gallops, or rubs Abdomen: soft, non-tender, non-distended, normal bowel sounds Extremities: large area of dark red discoloration under intact epidermis on plantar right 1st toe.  Whitish discoloration between toes of both feet.  2+ DP/PT pulses bilaterally, no cyanosis, clubbing, or edema Neurologic: alert & oriented X3, cranial nerves II-XII intact, strength grossly intact, sensation intact to light touch  Current Outpatient Prescriptions on File Prior to Visit  Medication Sig Dispense Refill  . Ascorbic Acid (VITAMIN C) 500 MG tablet Take 500 mg by mouth daily.        Marland Kitchen aspirin 81 MG tablet Take 81 mg by mouth daily.       . B Complex-C-Folic Acid (RENA-VITE PO) Take 1 tablet by mouth daily.      . calcium carbonate (TUMS - DOSED IN MG ELEMENTAL CALCIUM) 500 MG chewable tablet Chew 1 tablet by mouth daily.      . carvedilol (COREG) 6.25 MG tablet Take 1 tablet (6.25 mg total) by mouth 2 (two) times daily.  180 tablet  4  . docusate sodium (DULCOLAX) 100 MG capsule Take 100 mg by mouth as needed.        Marland Kitchen epoetin alfa (EPOGEN) 3000 UNIT/ML injection Take 6000 units IV with HD       . glipiZIDE (GLUCOTROL) 2.5 MG 24 hr tablet Take 2.5 mg by mouth daily.        Marland Kitchen glucose blood (PRODIGY TEST) test strip Use to test blood sugar  level three times daily before meals       . Lancets MISC Use to test blood glucose three times a day       . Multiple Vitamin (RENAL MULTIVITAMIN/ZINC) TABS Take 1 tablet by mouth daily.        . promethazine (PHENERGAN) 25 MG tablet Take 1 tablet by mouth for nausea during your dialysis         Assessment/Plan

## 2012-06-06 NOTE — Assessment & Plan Note (Signed)
The patient's A1C has increased from 7.5 (10/2010) to 9.4.  The patient is currently managed on glipizide 2.5 mg/day.  He also notes a blister on his right 1st toe, which now appears to have resolved, with intact epidermis overlying some residual dried blood.  The patient has not been checking his blood sugar for the last year -increase glipizide to 5 mg/day -patient given glucometer today and prescription for test strips -diabetic shoes ordered -patient to return in 1-2 months for glucometer review.  If CBG's still elevated, consider further increase in glipizide vs addition of Januvia -patient notes eye exam 2 weeks ago with Dr. Silvestre Gunner [facility]) -patient notes flu shot last week at HD

## 2012-06-06 NOTE — Patient Instructions (Addendum)
Your Hemoglobin A1C has increased significantly since your last visit.  To improve your blood sugar control: -start checking your blood sugar at least once/day -increase your Glipizide to 5 mg daily -we are sending in an order for diabetic shoes -use Lotrimin cream for athlete's foot.  Apply twice per day for 4 weeks.  We are checking some routine lab work today.  We will call you if any of the results are abnormal.  Please return for a follow-up visit in 1-2 months for further management of diabetes

## 2012-06-06 NOTE — Assessment & Plan Note (Signed)
Patient notes bilateral foot "itching" sensation, with whitish discoloration between toes of both feet, which may represent tinea pedis.  Differential also includes newly-developed diabetic neuropathy. -lotrimin cream to feet bilaterally

## 2012-06-06 NOTE — Assessment & Plan Note (Signed)
Patient has a history of HTN, but now notes some hypotension after HD. -continue coreg

## 2012-06-07 LAB — COMPLETE METABOLIC PANEL WITH GFR
Albumin: 4.4 g/dL (ref 3.5–5.2)
Alkaline Phosphatase: 81 U/L (ref 39–117)
BUN: 49 mg/dL — ABNORMAL HIGH (ref 6–23)
GFR, Est African American: 5 mL/min — ABNORMAL LOW
GFR, Est Non African American: 4 mL/min — ABNORMAL LOW
Glucose, Bld: 214 mg/dL — ABNORMAL HIGH (ref 70–99)
Total Bilirubin: 0.3 mg/dL (ref 0.3–1.2)

## 2012-06-07 LAB — LIPID PANEL
Cholesterol: 196 mg/dL (ref 0–200)
HDL: 29 mg/dL — ABNORMAL LOW (ref >39)
LDL Cholesterol: 105 mg/dL — ABNORMAL HIGH (ref 0–99)
Triglycerides: 312 mg/dL — ABNORMAL HIGH (ref <150)

## 2012-06-07 NOTE — Addendum Note (Signed)
Addended by: Hester Mates on: 06/07/2012 09:18 AM   Modules accepted: Orders

## 2012-06-15 DIAGNOSIS — N186 End stage renal disease: Secondary | ICD-10-CM | POA: Diagnosis not present

## 2012-06-16 ENCOUNTER — Telehealth: Payer: Self-pay | Admitting: Cardiology

## 2012-06-16 DIAGNOSIS — N2581 Secondary hyperparathyroidism of renal origin: Secondary | ICD-10-CM | POA: Diagnosis not present

## 2012-06-16 DIAGNOSIS — R509 Fever, unspecified: Secondary | ICD-10-CM | POA: Diagnosis not present

## 2012-06-16 DIAGNOSIS — D631 Anemia in chronic kidney disease: Secondary | ICD-10-CM | POA: Diagnosis not present

## 2012-06-16 DIAGNOSIS — E875 Hyperkalemia: Secondary | ICD-10-CM | POA: Diagnosis not present

## 2012-06-16 DIAGNOSIS — N186 End stage renal disease: Secondary | ICD-10-CM | POA: Diagnosis not present

## 2012-06-16 DIAGNOSIS — N039 Chronic nephritic syndrome with unspecified morphologic changes: Secondary | ICD-10-CM | POA: Diagnosis not present

## 2012-06-16 NOTE — Telephone Encounter (Signed)
Spoke with kara, questions answered about paperwork from sept.

## 2012-06-16 NOTE — Telephone Encounter (Signed)
Please return call to kara- Dr. Charm Rings [facility] organ transplant [phone] regarding surgical clearance for upcoming organ transplant.

## 2012-06-19 ENCOUNTER — Other Ambulatory Visit: Payer: Self-pay | Admitting: *Deleted

## 2012-06-19 DIAGNOSIS — N2581 Secondary hyperparathyroidism of renal origin: Secondary | ICD-10-CM | POA: Diagnosis not present

## 2012-06-19 DIAGNOSIS — E875 Hyperkalemia: Secondary | ICD-10-CM | POA: Diagnosis not present

## 2012-06-19 DIAGNOSIS — D631 Anemia in chronic kidney disease: Secondary | ICD-10-CM | POA: Diagnosis not present

## 2012-06-19 DIAGNOSIS — R509 Fever, unspecified: Secondary | ICD-10-CM | POA: Diagnosis not present

## 2012-06-19 DIAGNOSIS — N186 End stage renal disease: Secondary | ICD-10-CM | POA: Diagnosis not present

## 2012-06-19 DIAGNOSIS — E1165 Type 2 diabetes mellitus with hyperglycemia: Secondary | ICD-10-CM

## 2012-06-19 NOTE — Telephone Encounter (Signed)
Fax from [facility] requires new rx every 6 months on diabetes supplies. Need new rx w/5 refills instead of 12.  Thanks

## 2012-06-20 MED ORDER — GLUCOSE BLOOD VI STRP: ORAL_STRIP | Status: DC

## 2012-06-21 DIAGNOSIS — N2581 Secondary hyperparathyroidism of renal origin: Secondary | ICD-10-CM | POA: Diagnosis not present

## 2012-06-21 DIAGNOSIS — N186 End stage renal disease: Secondary | ICD-10-CM | POA: Diagnosis not present

## 2012-06-21 DIAGNOSIS — D631 Anemia in chronic kidney disease: Secondary | ICD-10-CM | POA: Diagnosis not present

## 2012-06-21 DIAGNOSIS — R509 Fever, unspecified: Secondary | ICD-10-CM | POA: Diagnosis not present

## 2012-06-21 DIAGNOSIS — E875 Hyperkalemia: Secondary | ICD-10-CM | POA: Diagnosis not present

## 2012-06-23 DIAGNOSIS — E875 Hyperkalemia: Secondary | ICD-10-CM | POA: Diagnosis not present

## 2012-06-23 DIAGNOSIS — N039 Chronic nephritic syndrome with unspecified morphologic changes: Secondary | ICD-10-CM | POA: Diagnosis not present

## 2012-06-23 DIAGNOSIS — N2581 Secondary hyperparathyroidism of renal origin: Secondary | ICD-10-CM | POA: Diagnosis not present

## 2012-06-23 DIAGNOSIS — R509 Fever, unspecified: Secondary | ICD-10-CM | POA: Diagnosis not present

## 2012-06-23 DIAGNOSIS — N186 End stage renal disease: Secondary | ICD-10-CM | POA: Diagnosis not present

## 2012-06-26 DIAGNOSIS — E875 Hyperkalemia: Secondary | ICD-10-CM | POA: Diagnosis not present

## 2012-06-26 DIAGNOSIS — N2581 Secondary hyperparathyroidism of renal origin: Secondary | ICD-10-CM | POA: Diagnosis not present

## 2012-06-26 DIAGNOSIS — D631 Anemia in chronic kidney disease: Secondary | ICD-10-CM | POA: Diagnosis not present

## 2012-06-26 DIAGNOSIS — N186 End stage renal disease: Secondary | ICD-10-CM | POA: Diagnosis not present

## 2012-06-26 DIAGNOSIS — R509 Fever, unspecified: Secondary | ICD-10-CM | POA: Diagnosis not present

## 2012-06-28 DIAGNOSIS — D631 Anemia in chronic kidney disease: Secondary | ICD-10-CM | POA: Diagnosis not present

## 2012-06-28 DIAGNOSIS — E875 Hyperkalemia: Secondary | ICD-10-CM | POA: Diagnosis not present

## 2012-06-28 DIAGNOSIS — N186 End stage renal disease: Secondary | ICD-10-CM | POA: Diagnosis not present

## 2012-06-28 DIAGNOSIS — R509 Fever, unspecified: Secondary | ICD-10-CM | POA: Diagnosis not present

## 2012-06-28 DIAGNOSIS — N2581 Secondary hyperparathyroidism of renal origin: Secondary | ICD-10-CM | POA: Diagnosis not present

## 2012-06-30 DIAGNOSIS — D631 Anemia in chronic kidney disease: Secondary | ICD-10-CM | POA: Diagnosis not present

## 2012-06-30 DIAGNOSIS — E875 Hyperkalemia: Secondary | ICD-10-CM | POA: Diagnosis not present

## 2012-06-30 DIAGNOSIS — N186 End stage renal disease: Secondary | ICD-10-CM | POA: Diagnosis not present

## 2012-06-30 DIAGNOSIS — R509 Fever, unspecified: Secondary | ICD-10-CM | POA: Diagnosis not present

## 2012-06-30 DIAGNOSIS — N2581 Secondary hyperparathyroidism of renal origin: Secondary | ICD-10-CM | POA: Diagnosis not present

## 2012-07-03 DIAGNOSIS — D631 Anemia in chronic kidney disease: Secondary | ICD-10-CM | POA: Diagnosis not present

## 2012-07-03 DIAGNOSIS — N186 End stage renal disease: Secondary | ICD-10-CM | POA: Diagnosis not present

## 2012-07-03 DIAGNOSIS — N2581 Secondary hyperparathyroidism of renal origin: Secondary | ICD-10-CM | POA: Diagnosis not present

## 2012-07-03 DIAGNOSIS — R509 Fever, unspecified: Secondary | ICD-10-CM | POA: Diagnosis not present

## 2012-07-03 DIAGNOSIS — E875 Hyperkalemia: Secondary | ICD-10-CM | POA: Diagnosis not present

## 2012-07-05 DIAGNOSIS — R509 Fever, unspecified: Secondary | ICD-10-CM | POA: Diagnosis not present

## 2012-07-05 DIAGNOSIS — E875 Hyperkalemia: Secondary | ICD-10-CM | POA: Diagnosis not present

## 2012-07-05 DIAGNOSIS — N186 End stage renal disease: Secondary | ICD-10-CM | POA: Diagnosis not present

## 2012-07-05 DIAGNOSIS — N2581 Secondary hyperparathyroidism of renal origin: Secondary | ICD-10-CM | POA: Diagnosis not present

## 2012-07-05 DIAGNOSIS — N039 Chronic nephritic syndrome with unspecified morphologic changes: Secondary | ICD-10-CM | POA: Diagnosis not present

## 2012-07-06 ENCOUNTER — Other Ambulatory Visit: Payer: Self-pay | Admitting: *Deleted

## 2012-07-07 ENCOUNTER — Encounter (HOSPITAL_COMMUNITY): Payer: Self-pay | Admitting: Pharmacist

## 2012-07-07 DIAGNOSIS — N186 End stage renal disease: Secondary | ICD-10-CM | POA: Diagnosis not present

## 2012-07-07 DIAGNOSIS — E875 Hyperkalemia: Secondary | ICD-10-CM | POA: Diagnosis not present

## 2012-07-07 DIAGNOSIS — D631 Anemia in chronic kidney disease: Secondary | ICD-10-CM | POA: Diagnosis not present

## 2012-07-07 DIAGNOSIS — N2581 Secondary hyperparathyroidism of renal origin: Secondary | ICD-10-CM | POA: Diagnosis not present

## 2012-07-07 DIAGNOSIS — R509 Fever, unspecified: Secondary | ICD-10-CM | POA: Diagnosis not present

## 2012-07-10 DIAGNOSIS — D631 Anemia in chronic kidney disease: Secondary | ICD-10-CM | POA: Diagnosis not present

## 2012-07-10 DIAGNOSIS — R509 Fever, unspecified: Secondary | ICD-10-CM | POA: Diagnosis not present

## 2012-07-10 DIAGNOSIS — N2581 Secondary hyperparathyroidism of renal origin: Secondary | ICD-10-CM | POA: Diagnosis not present

## 2012-07-10 DIAGNOSIS — N186 End stage renal disease: Secondary | ICD-10-CM | POA: Diagnosis not present

## 2012-07-10 DIAGNOSIS — E875 Hyperkalemia: Secondary | ICD-10-CM | POA: Diagnosis not present

## 2012-07-11 ENCOUNTER — Encounter (HOSPITAL_COMMUNITY)
Admission: RE | Disposition: A | Discharge status: Home / Self Care | Payer: Self-pay | Source: Ambulatory Visit | Attending: Surgery

## 2012-07-11 ENCOUNTER — Ambulatory Visit (HOSPITAL_COMMUNITY)
Admission: RE | Admit: 2012-07-11 | Discharge: 2012-07-11 | Disposition: A | Discharge status: Home / Self Care | Payer: Medicare Other | Source: Ambulatory Visit | Attending: Surgery | Admitting: Surgery

## 2012-07-11 DIAGNOSIS — I120 Hypertensive chronic kidney disease with stage 5 chronic kidney disease or end stage renal disease: Secondary | ICD-10-CM | POA: Diagnosis not present

## 2012-07-11 DIAGNOSIS — Z992 Dependence on renal dialysis: Secondary | ICD-10-CM | POA: Diagnosis not present

## 2012-07-11 DIAGNOSIS — I871 Compression of vein: Secondary | ICD-10-CM | POA: Insufficient documentation

## 2012-07-11 DIAGNOSIS — E119 Type 2 diabetes mellitus without complications: Secondary | ICD-10-CM | POA: Diagnosis not present

## 2012-07-11 DIAGNOSIS — Y831 Surgical operation with implant of artificial internal device as the cause of abnormal reaction of the patient, or of later complication, without mention of misadventure at the time of the procedure: Secondary | ICD-10-CM | POA: Insufficient documentation

## 2012-07-11 DIAGNOSIS — T82898A Other specified complication of vascular prosthetic devices, implants and grafts, initial encounter: Secondary | ICD-10-CM | POA: Insufficient documentation

## 2012-07-11 DIAGNOSIS — Z79899 Other long term (current) drug therapy: Secondary | ICD-10-CM | POA: Diagnosis not present

## 2012-07-11 DIAGNOSIS — N186 End stage renal disease: Secondary | ICD-10-CM | POA: Insufficient documentation

## 2012-07-11 HISTORY — PX: SHUNTOGRAM: SHX5491

## 2012-07-11 LAB — POCT I-STAT, CHEM 8
Calcium, Ion: 0.9 mmol/L — ABNORMAL LOW (ref 1.12–1.23)
Glucose, Bld: 232 mg/dL — ABNORMAL HIGH (ref 70–99)
HCT: 36 % — ABNORMAL LOW (ref 39.0–52.0)
Hemoglobin: 12.2 g/dL — ABNORMAL LOW (ref 13.0–17.0)
Potassium: 4.4 mEq/L (ref 3.5–5.1)

## 2012-07-11 SURGERY — ASSESSMENT, SHUNT FUNCTION, WITH CONTRAST RADIOGRAPHIC STUDY
Anesthesia: LOCAL

## 2012-07-11 MED ORDER — SODIUM CHLORIDE 0.9 % IJ SOLN: 3.0000 mL | INTRAMUSCULAR | Status: DC | PRN

## 2012-07-11 MED ORDER — HEPARIN SODIUM (PORCINE) 1000 UNIT/ML IJ SOLN
INTRAMUSCULAR | Status: AC
  Filled 2012-07-11: qty 1

## 2012-07-11 MED ORDER — HEPARIN (PORCINE) IN NACL 2-0.9 UNIT/ML-% IJ SOLN
INTRAMUSCULAR | Status: AC
  Filled 2012-07-11: qty 500

## 2012-07-11 MED ORDER — LIDOCAINE HCL (PF) 1 % IJ SOLN
INTRAMUSCULAR | Status: AC
  Filled 2012-07-11: qty 30

## 2012-07-11 NOTE — Op Note (Signed)
[facility]  Patient name: [name] MRN: [id number] DOB: 12-02-1970 Sex: male  07/11/2012 Pre-operative Diagnosis: End-stage renal disease with poorly functioning left upper arm fistula Post-operative diagnosis:  Same Surgeon:  Eldridge Abrahams Procedure Performed:  1.  ultrasound access left arm fistula  2.  fistulogram  3.  PTA cephalic vein  4.  followup x1    Indications:  The patient comes in with decreased flow rates at dialysis. He has undergone intervention in May of this year.  Procedure:  The patient was identified in the holding area and taken to room 8.  The patient was then placed supine on the table and prepped and draped in the usual sterile fashion.  A time out was called.  Ultrasound was used to evaluate the fistula.  The vein was patent and compressible.  A digital ultrasound image was acquired.  The fistula was then accessed under ultrasound guidance using a micropuncture needle.  An 018 wire was then asvanced without resistance and a micropuncture sheath was placed.  Contrast injections were then performed through the sheath.  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 The arterial venous anastomosis was evaluated today as it was recently evaluated and found to be widely patent. The cephalic vein is patent in the upper arm with minimal branching. At the cephalic subclavian junction there are 2 focal stenosis with a large collateral feeding across the chest. The central venous system is widely patent.   Intervention:  After the above images were obtained, the decision was made to proceed with intervention. Over a Careers adviser, a 6 French sheath was placed. 4000 units of heparin were administered. I used a Kumpe catheter and a Glidewire to navigate a cross the stenosis into the wire into the central venous system. Over a Benson wire, balloon venoplasty was performed within the cephalic  vein. A 7 x 40 Mustang balloon was inflated to nominal pressure and held for 1 minute. A followup study revealed improved results however there is one area of residual narrowing. I elected to reinsert the 7 x 40 balloon. This time I inflated the balloon to rated pressure and held for 1 minute. Followup study revealed adequate blood flow across the areas of concern with minimal residual stenosis. At this point the decision was made to terminate the procedure. Catheters and wires were removed. The patient be taken to the holding area for sheath pull.  Impression:  #1  successful venoplasty of the cephalic vein stenosis near the junction to the deep system. A 7 x 40 balloon was utilized.  #2  the fistula remained amenable to percutaneous intervention   V. Annamarie Major, M.D. [facility] Office: [phone] Pager:  [phone]

## 2012-07-11 NOTE — H&P (Signed)
Vascular and Vein Specialist of [facility]   Patient name: [name] MRN: [id number] DOB: 1971-05-26 Sex: male    No chief complaint on file.   HISTORY OF PRESENT ILLNESS: The patient is status post left radiocephalic fistula creation. He recently began having issues with access of the fistula. He has a history of angioplasty of a venous stenosis in May of this year.  Past Medical History  Diagnosis Date  . Non-ischemic cardiomyopathy     EF 20-25% 12/23/2008>>EF 40-45% 03/2009>>EF ??? 7/?/2011  . ESRD (end stage renal disease) on dialysis     1.Initiated HD via right per cate 12/30/2008 (MWF schedule at [facility]) 2.Left upper extremity A-V fistula by Dr Oneida Alar 12/30/2008 3.Aemia: Received Venofer 12/30/08 Ferritin 179 % sat 12 12/23/08, 4. Secondary Hyperparathyroidism- PTH 188, P 5.4, Ca 9.2 12/2008  . Diabetes mellitus 12/2008    A1C 8.3 12/23/08, 24hr urine protein 2793 mg/day, SPEP neg, proliferative BDR s/p photocoagulation 08/2009 done at [facility] and f/u by DR Groat  . Hypertension     resloved after initating HD, now with post HD hypotension  . Hemodialysis-associated hypotension     coreg d/c'd  . Anemia     Past Surgical History  Procedure Date  . Cataract extraction, bilateral   . Av fistula placement     left  . Photocoagulation     for diabetic retinopathy 08/1999    History   Social History  . Marital Status: Married    Spouse Name: N/A    Number of Children: N/A  . Years of Education: N/A   Occupational History  . Not on file.   Social History Main Topics  . Smoking status: Never Smoker   . Smokeless tobacco: Not on file  . Alcohol Use: No  . Drug Use: No  . Sexually Active: Not on file   Other Topics Concern  . Not on file   Social History Narrative   Has two children. No risky sexual behavior.    Family History  Problem Relation Age of Onset  . Hypertension Mother   . Diabetes Father   . Kidney disease Father   . Coronary artery  disease Neg Hx     no hx of CHF, sudden cardiac death, or CAD    Allergies as of 07/06/2012  . (Not on File)    No current facility-administered medications on file prior to encounter.   Current Outpatient Prescriptions on File Prior to Encounter  Medication Sig Dispense Refill  . B Complex-C-Folic Acid (RENA-VITE PO) Take 1 tablet by mouth daily.      . Blood Glucose Monitoring Suppl (FREESTYLE FREEDOM LITE) W/DEVICE KIT Check blood sugar once daily as instructed dx code 250.00  1 each  0  . calcium carbonate (OS-CAL - DOSED IN MG OF ELEMENTAL CALCIUM) 1250 MG tablet Take 1 tablet by mouth 3 (three) times daily with meals.      . clotrimazole (LOTRIMIN) 1 % cream Apply topically 2 (two) times daily.  60 g  1  . docusate sodium (DULCOLAX) 100 MG capsule Take 100 mg by mouth daily as needed. For constipation      . glipiZIDE (GLUCOTROL XL) 5 MG 24 hr tablet Take 5 mg by mouth daily.      Marland Kitchen glucose blood (FREESTYLE LITE) test strip Check blood sugar once daily as instructed dx code 250.00  32 each  5  . Lancets 30G MISC Use to check blood sugar once daily as instructed dx  code 250.00  100 each  5     REVIEW OF SYSTEMS: No chest pain No shortness of breath  PHYSICAL EXAMINATION:   Vital signs are BP 115/73  Pulse 85  Temp 97.1 F (36.2 C) (Oral)  Resp 18  Ht 5\' 5"  (1.651 m)  Wt 165 lb (74.844 kg)  BMI 27.46 kg/m2  SpO2 95% General: The patient appears their stated age. HEENT:  No gross abnormalities Pulmonary:  Non labored breathing Musculoskeletal: There are no major deformities. Neurologic: No focal weakness or paresthesias are detected, Skin: There are no ulcer or rashes noted. Psychiatric: The patient has normal affect. Cardiovascular: Thrill within left radiocephalic fistula    Assessment: Poorly functioning left arm fistula Plan: The patient will undergo a fistulogram and intervention is indicated.  Eldridge Abrahams, M.D. Vascular and Vein Specialists of  [place] Office: [phone] Pager:  [phone]

## 2012-07-12 ENCOUNTER — Other Ambulatory Visit (HOSPITAL_COMMUNITY): Payer: Self-pay | Admitting: Nephrology

## 2012-07-12 ENCOUNTER — Ambulatory Visit (HOSPITAL_COMMUNITY)
Admission: RE | Admit: 2012-07-12 | Discharge: 2012-07-12 | Disposition: A | Discharge status: Home / Self Care | Payer: Medicare Other | Source: Ambulatory Visit | Attending: Nephrology | Admitting: Nephrology

## 2012-07-12 DIAGNOSIS — E119 Type 2 diabetes mellitus without complications: Secondary | ICD-10-CM | POA: Diagnosis not present

## 2012-07-12 DIAGNOSIS — N186 End stage renal disease: Secondary | ICD-10-CM

## 2012-07-12 DIAGNOSIS — T82898A Other specified complication of vascular prosthetic devices, implants and grafts, initial encounter: Secondary | ICD-10-CM | POA: Diagnosis not present

## 2012-07-12 DIAGNOSIS — I120 Hypertensive chronic kidney disease with stage 5 chronic kidney disease or end stage renal disease: Secondary | ICD-10-CM | POA: Diagnosis not present

## 2012-07-12 DIAGNOSIS — Z992 Dependence on renal dialysis: Secondary | ICD-10-CM | POA: Insufficient documentation

## 2012-07-12 LAB — POCT I-STAT 4, (NA,K, GLUC, HGB,HCT)
HCT: 37 % — ABNORMAL LOW (ref 39.0–52.0)
Hemoglobin: 12.6 g/dL — ABNORMAL LOW (ref 13.0–17.0)

## 2012-07-12 MED ORDER — ONDANSETRON HCL 4 MG/2ML IJ SOLN
INTRAMUSCULAR | Status: AC | PRN
  Administered 2012-07-12: 4 mg via INTRAVENOUS

## 2012-07-12 MED ORDER — MIDAZOLAM HCL 2 MG/2ML IJ SOLN
INTRAMUSCULAR | Status: AC | PRN
  Administered 2012-07-12: 0.5 mg via INTRAVENOUS
  Administered 2012-07-12: 1 mg via INTRAVENOUS

## 2012-07-12 MED ORDER — HEPARIN SODIUM (PORCINE) 1000 UNIT/ML IJ SOLN
INTRAMUSCULAR | Status: AC
  Filled 2012-07-12: qty 1

## 2012-07-12 MED ORDER — HEPARIN SODIUM (PORCINE) 1000 UNIT/ML IJ SOLN
INTRAMUSCULAR | Status: AC | PRN
  Administered 2012-07-12 (×2): 3000 [IU] via INTRAVENOUS

## 2012-07-12 MED ORDER — ALTEPLASE 2 MG IJ SOLR
4.0000 mg | Freq: Once | INTRAMUSCULAR | Status: AC
  Administered 2012-07-12: 4 mg
  Filled 2012-07-12: qty 4

## 2012-07-12 MED ORDER — FENTANYL CITRATE 0.05 MG/ML IJ SOLN
INTRAMUSCULAR | Status: AC | PRN
  Administered 2012-07-12: 25 ug via INTRAVENOUS
  Administered 2012-07-12: 50 ug via INTRAVENOUS

## 2012-07-12 MED ORDER — IOHEXOL 300 MG/ML  SOLN
100.0000 mL | Freq: Once | INTRAMUSCULAR | Status: AC | PRN
  Administered 2012-07-12: 90 mL via INTRAVENOUS

## 2012-07-12 MED ORDER — ONDANSETRON HCL 4 MG/2ML IJ SOLN
INTRAMUSCULAR | Status: AC
  Filled 2012-07-12: qty 2

## 2012-07-12 MED ORDER — FENTANYL CITRATE 0.05 MG/ML IJ SOLN
INTRAMUSCULAR | Status: AC
  Filled 2012-07-12: qty 4

## 2012-07-12 MED ORDER — MIDAZOLAM HCL 2 MG/2ML IJ SOLN
INTRAMUSCULAR | Status: AC
  Filled 2012-07-12: qty 4

## 2012-07-12 NOTE — H&P (Signed)
CC: patient presents today for repeat imaging/intervention to re-evaluate his dialysis access as per request of Dr. Trula Slade who intervened on patient's access yesterday - please see his note below for details.    HPI: patient had this site created 04/22/09. It appears from his history documented that patient has had multiple angioplasty procedures of the cephalic vein on 0000000, 01/28/11, 05/14/11, 01/04/12 and again by Dr. Trula Slade 07/11/12.  Patient states that needle inserted at dialysis today yielded return - however RN removed the needle and asked him to come today as her thoughts were that his access was clotted.   Serafina Mitchell, MD Physician Signed Vascular Surgery H&P 07/11/2012 1:06 PM  Related encounter: Admission (Discharged) from 07/11/2012 in [place] CATH LAB   Vascular and Vein Specialist of [facility]   Patient name: [name]            MRN: [id number]       DOB: [birth year]         Sex: male   No chief complaint on file.   HISTORY OF PRESENT ILLNESS: The patient is status post left radiocephalic fistula creation. He recently began having issues with access of the fistula. He has a history of angioplasty of a venous stenosis in May of this year.    Past Medical History   Diagnosis  Date   .  Non-ischemic cardiomyopathy         EF 20-25% 12/23/2008>>EF 40-45% 03/2009>>EF ??? 7/?/2011   .  ESRD (end stage renal disease) on dialysis         1.Initiated HD via right per cate 12/30/2008 (MWF schedule at [facility]) 2.Left upper extremity A-V fistula by Dr Oneida Alar 12/30/2008 3.Aemia: Received Venofer 12/30/08 Ferritin 179 % sat 12 12/23/08, 4. Secondary Hyperparathyroidism- PTH 188, P 5.4, Ca 9.2 12/2008   .  Diabetes mellitus  12/2008       A1C 8.3 12/23/08, 24hr urine protein 2793 mg/day, SPEP neg, proliferative BDR s/p photocoagulation 08/2009 done at [facility] and f/u by DR Groat   .  Hypertension         resloved after initating HD, now with post HD  hypotension   .  Hemodialysis-associated hypotension         coreg d/c'd   .  Anemia         Past Surgical History   Procedure  Date   .  Cataract extraction, bilateral     .  Av fistula placement         left   .  Photocoagulation         for diabetic retinopathy 08/1999       History      Social History   .  Marital Status:  Married       Spouse Name:  N/A       Number of Children:  N/A   .  Years of Education:  N/A      Occupational History   .  Not on file.      Social History Main Topics   .  Smoking status:  Never Smoker    .  Smokeless tobacco:  Not on file   .  Alcohol Use:  No   .  Drug Use:  No   .  Sexually Active:  Not on file      Other Topics  Concern   .  Not on file      Social History Narrative  Has two children. No risky sexual behavior.       Family History   Problem  Relation  Age of Onset   .  Hypertension  Mother     .  Diabetes  Father     .  Kidney disease  Father     .  Coronary artery disease  Neg Hx         no hx of CHF, sudden cardiac death, or CAD       Allergies as of 07/06/2012   .  (Not on File)       No current facility-administered medications on file prior to encounter.      Current Outpatient Prescriptions on File Prior to Encounter   Medication  Sig  Dispense  Refill   .  B Complex-C-Folic Acid (RENA-VITE PO)  Take 1 tablet by mouth daily.         .  Blood Glucose Monitoring Suppl (FREESTYLE FREEDOM LITE) W/DEVICE KIT  Check blood sugar once daily as instructed dx code 250.00   1 each   0   .  calcium carbonate (OS-CAL - DOSED IN MG OF ELEMENTAL CALCIUM) 1250 MG tablet  Take 1 tablet by mouth 3 (three) times daily with meals.         .  clotrimazole (LOTRIMIN) 1 % cream  Apply topically 2 (two) times daily.   60 g   1   .  docusate sodium (DULCOLAX) 100 MG capsule  Take 100 mg by mouth daily as needed. For constipation         .  glipiZIDE (GLUCOTROL XL) 5 MG 24 hr tablet  Take 5 mg by mouth daily.         Marland Kitchen   glucose blood (FREESTYLE LITE) test strip  Check blood sugar once daily as instructed dx code 250.00   32 each   5   .  Lancets 30G MISC  Use to check blood sugar once daily as instructed dx code 250.00   100 each   5      REVIEW OF SYSTEMS: No chest pain No shortness of breath  PHYSICAL EXAMINATION:   Vital signs are BP 115/73  Pulse 85  Temp 97.1 F (36.2 C) (Oral)  Resp 18  Ht 5\' 5"  (1.651 m)  Wt 165 lb (74.844 kg)  BMI 27.46 kg/m2  SpO2 95% General: The patient appears their stated age. HEENT:  No gross abnormalities Pulmonary:  Non labored breathing Musculoskeletal: There are no major deformities. Neurologic: No focal weakness or paresthesias are detected, Skin: There are no ulcer or rashes noted. Psychiatric: The patient has normal affect. Cardiovascular: Thrill within left radiocephalic fistula    Assessment: Poorly functioning left arm fistula Plan: The patient will undergo a fistulogram and intervention is indicated.  Eldridge Abrahams, M.D. Vascular and Vein Specialists of [place] Office: [phone] Pager:  [phone]    Serafina Mitchell, MD Physician Signed Vascular Surgery Op Note 07/11/2012 3:29 PM  Related encounter: Admission (Discharged) from 07/11/2012 in [place] CATH LAB  Vascular and Vein Specialists of [facility]  Patient name: [name]            MRN: [id number]       DOB: 1971-06-09         Sex: male  07/11/2012 Pre-operative Diagnosis: End-stage renal disease with poorly functioning left upper arm fistula Post-operative diagnosis:  Same Surgeon:  Eldridge Abrahams Procedure Performed:  1.  ultrasound access left arm fistula             2.  fistulogram             3.  PTA cephalic vein             4.  followup x1               Indications:  The patient comes in with decreased flow rates at dialysis. He has undergone intervention in May of this year.  Procedure:  The patient was  identified in the holding area and taken to room 8.  The patient was then placed supine on the table and prepped and draped in the usual sterile fashion.  A time out was called.  Ultrasound was used to evaluate the fistula.  The vein was patent and compressible.  A digital ultrasound image was acquired.  The fistula was then accessed under ultrasound guidance using a micropuncture needle.  An 018 wire was then asvanced without resistance and a micropuncture sheath was placed.  Contrast injections were then performed through the sheath.  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 The arterial venous anastomosis was evaluated today as it was recently evaluated and found to be widely patent. The cephalic vein is patent in the upper arm with minimal branching. At the cephalic subclavian junction there are 2 focal stenosis with a large collateral feeding across the chest. The central venous system is widely patent.              Intervention:  After the above images were obtained, the decision was made to proceed with intervention. Over a Careers adviser, a 6 French sheath was placed. 4000 units of heparin were administered. I used a Kumpe catheter and a Glidewire to navigate a cross the stenosis into the wire into the central venous system. Over a Benson wire, balloon venoplasty was performed within the cephalic vein. A 7 x 40 Mustang balloon was inflated to nominal pressure and held for 1 minute. A followup study revealed improved results however there is one area of residual narrowing. I elected to reinsert the 7 x 40 balloon. This time I inflated the balloon to rated pressure and held for 1 minute. Followup study revealed adequate blood flow across the areas of concern with minimal residual stenosis. At this point the decision was made to terminate the procedure. Catheters and wires were removed. The patient be taken to the holding area for sheath  pull.  Impression:             #1  successful venoplasty of the cephalic vein stenosis near the junction to the deep system. A 7 x 40 balloon was utilized.             #2  the fistula remained amenable to percutaneous intervention   V. Annamarie Major, M.D. Vascular and Vein Specialists of [place] Office: [phone] Pager:  [phone]   Above history reviewed with patient and find there have been no new changes in any of the above PMH, meds or symptoms since yesterday's procedure.   Today's findings : General : patient is alert and oriented. He is not the best historian in regards to his last time eaten and procedures performed on his HD access. CV:  RRR without evidence of murmurs, rubs or gallops.  Resp : CTA bilaterally MS: left upper arm AVF with palpable pulse. Two bandaids placed where he was accessed  today. Two large soft pseudoaneurysms are present with no palpable thrill.  Results for SUMPTER, KICE (MRN [id number]) as of 07/12/2012 11:57  Ref. Range 07/12/2012 11:24  Hemoglobin Latest Range: 13.0-17.0 g/dL 12.6 (L)  HCT Latest Range: 39.0-52.0 % 37.0 (L)    Results for NOVAK, RIEHM (MRN [id number]) as of 07/12/2012 11:57  Ref. Range 07/12/2012 11:24  Potassium Latest Range: 3.5-5.1 mEq/L 4.7   A/P : After speaking with Dr. Trula Slade who intervened on this patient yesterday - we will proceed with imaging and possible angioplasty, declot procedure if necessary with possible hemodialysis catheter placement if unsuccessful.  Patient made aware that this access has been intervened on several times and the more it is intervened on - the possibility of being unsuccessful becomes greater.  He understands this and is agreeable to the above procedures as needed as well as having a HD catheter placed if unsuccessful. Written consent obtained.

## 2012-07-12 NOTE — ED Notes (Signed)
Pt with N/V; received order for Zofran 4mg  IV; Will continue to monitor closely

## 2012-07-12 NOTE — ED Notes (Signed)
Placed on 2L O2. SpO2 95% on RA

## 2012-07-12 NOTE — ED Notes (Signed)
AVF clotted; proceeding with declot

## 2012-07-12 NOTE — ED Notes (Signed)
N/V resolved

## 2012-07-12 NOTE — ED Notes (Addendum)
Pt in procedure room. Plan: proceed with fistulogram to determine if and what further intervention is needed

## 2012-07-12 NOTE — Procedures (Signed)
Technically successful declot of left upper arm native dialysis fistula with restoration of antegrade flow.  No immediate post procedural complications.

## 2012-07-12 NOTE — ED Notes (Signed)
Family updated as to patient's status. Spoke with spouse on phone. All questions answered.

## 2012-07-13 DIAGNOSIS — E875 Hyperkalemia: Secondary | ICD-10-CM | POA: Diagnosis not present

## 2012-07-13 DIAGNOSIS — N186 End stage renal disease: Secondary | ICD-10-CM | POA: Diagnosis not present

## 2012-07-13 DIAGNOSIS — D631 Anemia in chronic kidney disease: Secondary | ICD-10-CM | POA: Diagnosis not present

## 2012-07-13 DIAGNOSIS — N2581 Secondary hyperparathyroidism of renal origin: Secondary | ICD-10-CM | POA: Diagnosis not present

## 2012-07-13 DIAGNOSIS — R509 Fever, unspecified: Secondary | ICD-10-CM | POA: Diagnosis not present

## 2012-07-14 ENCOUNTER — Ambulatory Visit (HOSPITAL_COMMUNITY)
Admission: RE | Admit: 2012-07-14 | Discharge: 2012-07-14 | Disposition: A | Discharge status: Home / Self Care | Payer: Medicare Other | Source: Ambulatory Visit | Attending: Vascular Surgery | Admitting: Vascular Surgery

## 2012-07-14 ENCOUNTER — Telehealth (HOSPITAL_COMMUNITY): Payer: Self-pay

## 2012-07-14 ENCOUNTER — Encounter (HOSPITAL_COMMUNITY)
Admission: RE | Disposition: A | Discharge status: Home / Self Care | Payer: Self-pay | Source: Ambulatory Visit | Attending: Vascular Surgery

## 2012-07-14 ENCOUNTER — Inpatient Hospital Stay (HOSPITAL_COMMUNITY): Payer: Medicare Other

## 2012-07-14 ENCOUNTER — Encounter (HOSPITAL_COMMUNITY): Payer: Self-pay | Admitting: Anesthesiology

## 2012-07-14 ENCOUNTER — Encounter (HOSPITAL_COMMUNITY): Payer: Self-pay | Admitting: *Deleted

## 2012-07-14 ENCOUNTER — Inpatient Hospital Stay (HOSPITAL_COMMUNITY): Payer: Medicare Other | Admitting: Anesthesiology

## 2012-07-14 DIAGNOSIS — N2581 Secondary hyperparathyroidism of renal origin: Secondary | ICD-10-CM | POA: Diagnosis not present

## 2012-07-14 DIAGNOSIS — E875 Hyperkalemia: Secondary | ICD-10-CM | POA: Diagnosis not present

## 2012-07-14 DIAGNOSIS — E119 Type 2 diabetes mellitus without complications: Secondary | ICD-10-CM | POA: Insufficient documentation

## 2012-07-14 DIAGNOSIS — Z8249 Family history of ischemic heart disease and other diseases of the circulatory system: Secondary | ICD-10-CM | POA: Insufficient documentation

## 2012-07-14 DIAGNOSIS — Z841 Family history of disorders of kidney and ureter: Secondary | ICD-10-CM | POA: Insufficient documentation

## 2012-07-14 DIAGNOSIS — Z992 Dependence on renal dialysis: Secondary | ICD-10-CM | POA: Insufficient documentation

## 2012-07-14 DIAGNOSIS — Z833 Family history of diabetes mellitus: Secondary | ICD-10-CM | POA: Insufficient documentation

## 2012-07-14 DIAGNOSIS — I10 Essential (primary) hypertension: Secondary | ICD-10-CM | POA: Diagnosis not present

## 2012-07-14 DIAGNOSIS — I120 Hypertensive chronic kidney disease with stage 5 chronic kidney disease or end stage renal disease: Secondary | ICD-10-CM | POA: Insufficient documentation

## 2012-07-14 DIAGNOSIS — D649 Anemia, unspecified: Secondary | ICD-10-CM | POA: Diagnosis not present

## 2012-07-14 DIAGNOSIS — Z9849 Cataract extraction status, unspecified eye: Secondary | ICD-10-CM | POA: Diagnosis not present

## 2012-07-14 DIAGNOSIS — N039 Chronic nephritic syndrome with unspecified morphologic changes: Secondary | ICD-10-CM | POA: Diagnosis not present

## 2012-07-14 DIAGNOSIS — Z452 Encounter for adjustment and management of vascular access device: Secondary | ICD-10-CM | POA: Diagnosis not present

## 2012-07-14 DIAGNOSIS — Z01818 Encounter for other preprocedural examination: Secondary | ICD-10-CM | POA: Diagnosis not present

## 2012-07-14 DIAGNOSIS — R509 Fever, unspecified: Secondary | ICD-10-CM | POA: Diagnosis not present

## 2012-07-14 DIAGNOSIS — N186 End stage renal disease: Secondary | ICD-10-CM

## 2012-07-14 HISTORY — PX: INSERTION OF DIALYSIS CATHETER: SHX1324

## 2012-07-14 LAB — POCT I-STAT 4, (NA,K, GLUC, HGB,HCT)
Glucose, Bld: 180 mg/dL — ABNORMAL HIGH (ref 70–99)
HCT: 30 % — ABNORMAL LOW (ref 39.0–52.0)
Hemoglobin: 10.2 g/dL — ABNORMAL LOW (ref 13.0–17.0)
Potassium: 4.7 mEq/L (ref 3.5–5.1)
Sodium: 135 mEq/L (ref 135–145)

## 2012-07-14 LAB — GLUCOSE, CAPILLARY: Glucose-Capillary: 184 mg/dL — ABNORMAL HIGH (ref 70–99)

## 2012-07-14 SURGERY — INSERTION OF DIALYSIS CATHETER
Anesthesia: Monitor Anesthesia Care | Site: Chest | Laterality: Right | Wound class: Clean

## 2012-07-14 MED ORDER — MUPIROCIN 2 % EX OINT
TOPICAL_OINTMENT | CUTANEOUS | Status: AC
  Administered 2012-07-14: 1 via NASAL
  Filled 2012-07-14: qty 22

## 2012-07-14 MED ORDER — FENTANYL CITRATE 0.05 MG/ML IJ SOLN
INTRAMUSCULAR | Status: DC | PRN
  Administered 2012-07-14 (×2): 50 ug via INTRAVENOUS

## 2012-07-14 MED ORDER — CEFAZOLIN SODIUM 1-5 GM-% IV SOLN
INTRAVENOUS | Status: AC
  Filled 2012-07-14: qty 50

## 2012-07-14 MED ORDER — LIDOCAINE HCL (PF) 1 % IJ SOLN
INTRAMUSCULAR | Status: AC
  Filled 2012-07-14: qty 30

## 2012-07-14 MED ORDER — SODIUM CHLORIDE 0.9 % IR SOLN
Status: DC | PRN
  Administered 2012-07-14: 08:00:00

## 2012-07-14 MED ORDER — ONDANSETRON HCL 4 MG/2ML IJ SOLN
INTRAMUSCULAR | Status: AC
  Filled 2012-07-14: qty 2

## 2012-07-14 MED ORDER — MUPIROCIN 2 % EX OINT
TOPICAL_OINTMENT | Freq: Two times a day (BID) | CUTANEOUS | Status: DC
  Administered 2012-07-14: 1 via NASAL

## 2012-07-14 MED ORDER — PROPOFOL INFUSION 10 MG/ML OPTIME
INTRAVENOUS | Status: DC | PRN
  Administered 2012-07-14: 25 ug/kg/min via INTRAVENOUS

## 2012-07-14 MED ORDER — ONDANSETRON HCL 4 MG/2ML IJ SOLN
4.0000 mg | Freq: Once | INTRAMUSCULAR | Status: AC | PRN
  Administered 2012-07-14: 4 mg via INTRAVENOUS

## 2012-07-14 MED ORDER — MIDAZOLAM HCL 5 MG/5ML IJ SOLN
INTRAMUSCULAR | Status: DC | PRN
  Administered 2012-07-14: 1 mg via INTRAVENOUS

## 2012-07-14 MED ORDER — CEFAZOLIN SODIUM 1-5 GM-% IV SOLN
INTRAVENOUS | Status: DC | PRN
  Administered 2012-07-14: 1 g via INTRAVENOUS

## 2012-07-14 MED ORDER — LIDOCAINE HCL (PF) 1 % IJ SOLN
INTRAMUSCULAR | Status: DC | PRN
  Administered 2012-07-14: 30 mL

## 2012-07-14 MED ORDER — SODIUM CHLORIDE 0.9 % IR SOLN
Status: DC | PRN
  Administered 2012-07-14: 1000 mL

## 2012-07-14 MED ORDER — HEPARIN SODIUM (PORCINE) 1000 UNIT/ML IJ SOLN
INTRAMUSCULAR | Status: DC | PRN
  Administered 2012-07-14: 10 mL

## 2012-07-14 MED ORDER — HEPARIN SODIUM (PORCINE) 1000 UNIT/ML IJ SOLN
INTRAMUSCULAR | Status: AC
  Filled 2012-07-14: qty 1

## 2012-07-14 MED ORDER — HYDROMORPHONE HCL PF 1 MG/ML IJ SOLN
0.2500 mg | INTRAMUSCULAR | Status: DC | PRN
Start: 2012-07-14 — End: 2012-07-14

## 2012-07-14 MED ORDER — SODIUM CHLORIDE 0.9 % IV SOLN
INTRAVENOUS | Status: DC | PRN
  Administered 2012-07-14: 07:00:00 via INTRAVENOUS

## 2012-07-14 SURGICAL SUPPLY — 36 items
CANISTER SUCTION 2500CC (MISCELLANEOUS) ×3 IMPLANT
CATH CANNON HEMO 15FR 19 (HEMODIALYSIS SUPPLIES) ×3 IMPLANT
CATH EMB 5FR 80CM (CATHETERS) ×3 IMPLANT
CLIP TI MEDIUM 6 (CLIP) IMPLANT
CLIP TI WIDE RED SMALL 6 (CLIP) IMPLANT
CLOTH BEACON ORANGE TIMEOUT ST (SAFETY) ×3 IMPLANT
COVER PROBE W GEL 5X96 (DRAPES) ×3 IMPLANT
COVER SURGICAL LIGHT HANDLE (MISCELLANEOUS) ×3 IMPLANT
DERMABOND ADVANCED (GAUZE/BANDAGES/DRESSINGS) ×1
DERMABOND ADVANCED .7 DNX12 (GAUZE/BANDAGES/DRESSINGS) ×2 IMPLANT
ELECT REM PT RETURN 9FT ADLT (ELECTROSURGICAL)
ELECTRODE REM PT RTRN 9FT ADLT (ELECTROSURGICAL) IMPLANT
GAUZE SPONGE 2X2 8PLY STRL LF (GAUZE/BANDAGES/DRESSINGS) ×2 IMPLANT
GEL ULTRASOUND 20GR AQUASONIC (MISCELLANEOUS) ×3 IMPLANT
GLOVE SS BIOGEL STRL SZ 7 (GLOVE) ×2 IMPLANT
GLOVE SUPERSENSE BIOGEL SZ 7 (GLOVE) ×1
GLOVE SURG SS PI 7.0 STRL IVOR (GLOVE) ×6 IMPLANT
GOWN STRL NON-REIN LRG LVL3 (GOWN DISPOSABLE) ×6 IMPLANT
GOWN STRL REIN XL XLG (GOWN DISPOSABLE) ×6 IMPLANT
KIT BASIN OR (CUSTOM PROCEDURE TRAY) ×3 IMPLANT
KIT ROOM TURNOVER OR (KITS) ×3 IMPLANT
NS IRRIG 1000ML POUR BTL (IV SOLUTION) ×3 IMPLANT
PACK CV ACCESS (CUSTOM PROCEDURE TRAY) ×3 IMPLANT
PAD ARMBOARD 7.5X6 YLW CONV (MISCELLANEOUS) ×6 IMPLANT
SPONGE GAUZE 2X2 STER 10/PKG (GAUZE/BANDAGES/DRESSINGS) ×1
SPONGE GAUZE 4X4 12PLY (GAUZE/BANDAGES/DRESSINGS) ×3 IMPLANT
SUT ETHILON 3 0 PS 1 (SUTURE) ×3 IMPLANT
SUT PROLENE 6 0 BV (SUTURE) IMPLANT
SUT VIC AB 3-0 SH 27 (SUTURE) ×1
SUT VIC AB 3-0 SH 27X BRD (SUTURE) ×2 IMPLANT
SUT VICRYL 4-0 PS2 18IN ABS (SUTURE) ×3 IMPLANT
TAPE PAPER 3X10 WHT MICROPORE (GAUZE/BANDAGES/DRESSINGS) ×3 IMPLANT
TOWEL OR 17X24 6PK STRL BLUE (TOWEL DISPOSABLE) ×3 IMPLANT
TOWEL OR 17X26 10 PK STRL BLUE (TOWEL DISPOSABLE) ×3 IMPLANT
UNDERPAD 30X30 INCONTINENT (UNDERPADS AND DIAPERS) IMPLANT
WATER STERILE IRR 1000ML POUR (IV SOLUTION) ×3 IMPLANT

## 2012-07-14 NOTE — Anesthesia Preprocedure Evaluation (Addendum)
Anesthesia Evaluation  Patient identified by MRN, date of birth, ID band Patient awake    Reviewed: Allergy & Precautions, H&P , NPO status , Patient's Chart, lab work & pertinent test results, reviewed documented beta blocker date and time   Airway       Dental   Pulmonary neg pulmonary ROS,          Cardiovascular hypertension, Pt. on home beta blockers and Pt. on medications     Neuro/Psych negative neurological ROS     GI/Hepatic negative GI ROS, Neg liver ROS,   Endo/Other  diabetes, Well Controlled, Type 2, Oral Hypoglycemic Agents  Renal/GU Dialysis and ESRFRenal disease     Musculoskeletal negative musculoskeletal ROS (+)   Abdominal   Peds  Hematology negative hematology ROS (+)   Anesthesia Other Findings   Reproductive/Obstetrics                          Anesthesia Physical Anesthesia Plan  ASA: III  Anesthesia Plan: MAC   Post-op Pain Management:    Induction:   Airway Management Planned: Simple Face Mask  Additional Equipment:   Intra-op Plan:   Post-operative Plan:   Informed Consent: I have reviewed the patients History and Physical, chart, labs and discussed the procedure including the risks, benefits and alternatives for the proposed anesthesia with the patient or authorized representative who has indicated his/her understanding and acceptance.   Dental advisory given  Plan Discussed with: Anesthesiologist and Surgeon  Anesthesia Plan Comments:         Anesthesia Quick Evaluation

## 2012-07-14 NOTE — Interval H&P Note (Signed)
History and Physical Interval Note:  07/14/2012 7:34 AM  [name]  has presented today for surgery, with the diagnosis of End stage renal failure  The various methods of treatment have been discussed with the patient and family. After consideration of risks, benefits and other options for treatment, the patient has consented to  Procedure(s) (LRB) with comments: INSERTION OF DIALYSIS CATHETER () as a surgical intervention .  The patient's history has been reviewed, patient examined, no change in status, stable for surgery.  I have reviewed the patient's chart and labs.  Questions were answered to the patient's satisfaction.     Tinnie Gens

## 2012-07-14 NOTE — Transfer of Care (Signed)
Immediate Anesthesia Transfer of Care Note  Patient: [name]  Procedure(s) Performed: Procedure(s) (LRB) with comments: INSERTION OF DIALYSIS CATHETER (Right) - Ultrasound Guided Insertion of Internal Jugular Dialysis Catheter  Patient Location: PACU  Anesthesia Type:MAC  Level of Consciousness: awake, alert  and oriented  Airway & Oxygen Therapy: Patient Spontanous Breathing  Post-op Assessment: Report given to PACU RN and Post -op Vital signs reviewed and stable  Post vital signs: Reviewed and stable  Complications: No apparent anesthesia complications

## 2012-07-14 NOTE — Preoperative (Signed)
Beta Blockers   Reason not to administer Beta Blockers:Not Applicable 

## 2012-07-14 NOTE — H&P (View-Only) (Signed)
Vascular and Vein Specialist of [facility]   Patient name: [name] MRN: [id number] DOB: Dec 18, 1970 Sex: male    No chief complaint on file.   HISTORY OF PRESENT ILLNESS: The patient is status post left radiocephalic fistula creation. He recently began having issues with access of the fistula. He has a history of angioplasty of a venous stenosis in May of this year.  Past Medical History  Diagnosis Date  . Non-ischemic cardiomyopathy     EF 20-25% 12/23/2008>>EF 40-45% 03/2009>>EF ??? 7/?/2011  . ESRD (end stage renal disease) on dialysis     1.Initiated HD via right per cate 12/30/2008 (MWF schedule at [facility]) 2.Left upper extremity A-V fistula by Dr Oneida Alar 12/30/2008 3.Aemia: Received Venofer 12/30/08 Ferritin 179 % sat 12 12/23/08, 4. Secondary Hyperparathyroidism- PTH 188, P 5.4, Ca 9.2 12/2008  . Diabetes mellitus 12/2008    A1C 8.3 12/23/08, 24hr urine protein 2793 mg/day, SPEP neg, proliferative BDR s/p photocoagulation 08/2009 done at [facility] and f/u by DR Groat  . Hypertension     resloved after initating HD, now with post HD hypotension  . Hemodialysis-associated hypotension     coreg d/c'd  . Anemia     Past Surgical History  Procedure Date  . Cataract extraction, bilateral   . Av fistula placement     left  . Photocoagulation     for diabetic retinopathy 08/1999    History   Social History  . Marital Status: Married    Spouse Name: N/A    Number of Children: N/A  . Years of Education: N/A   Occupational History  . Not on file.   Social History Main Topics  . Smoking status: Never Smoker   . Smokeless tobacco: Not on file  . Alcohol Use: No  . Drug Use: No  . Sexually Active: Not on file   Other Topics Concern  . Not on file   Social History Narrative   Has two children. No risky sexual behavior.    Family History  Problem Relation Age of Onset  . Hypertension Mother   . Diabetes Father   . Kidney disease Father   . Coronary artery  disease Neg Hx     no hx of CHF, sudden cardiac death, or CAD    Allergies as of 07/06/2012  . (Not on File)    No current facility-administered medications on file prior to encounter.   Current Outpatient Prescriptions on File Prior to Encounter  Medication Sig Dispense Refill  . B Complex-C-Folic Acid (RENA-VITE PO) Take 1 tablet by mouth daily.      . Blood Glucose Monitoring Suppl (FREESTYLE FREEDOM LITE) W/DEVICE KIT Check blood sugar once daily as instructed dx code 250.00  1 each  0  . calcium carbonate (OS-CAL - DOSED IN MG OF ELEMENTAL CALCIUM) 1250 MG tablet Take 1 tablet by mouth 3 (three) times daily with meals.      . clotrimazole (LOTRIMIN) 1 % cream Apply topically 2 (two) times daily.  60 g  1  . docusate sodium (DULCOLAX) 100 MG capsule Take 100 mg by mouth daily as needed. For constipation      . glipiZIDE (GLUCOTROL XL) 5 MG 24 hr tablet Take 5 mg by mouth daily.      Marland Kitchen glucose blood (FREESTYLE LITE) test strip Check blood sugar once daily as instructed dx code 250.00  32 each  5  . Lancets 30G MISC Use to check blood sugar once daily as instructed dx  code 250.00  100 each  5     REVIEW OF SYSTEMS: No chest pain No shortness of breath  PHYSICAL EXAMINATION:   Vital signs are BP 115/73  Pulse 85  Temp 97.1 F (36.2 C) (Oral)  Resp 18  Ht 5\' 5"  (1.651 m)  Wt 165 lb (74.844 kg)  BMI 27.46 kg/m2  SpO2 95% General: The patient appears their stated age. HEENT:  No gross abnormalities Pulmonary:  Non labored breathing Musculoskeletal: There are no major deformities. Neurologic: No focal weakness or paresthesias are detected, Skin: There are no ulcer or rashes noted. Psychiatric: The patient has normal affect. Cardiovascular: Thrill within left radiocephalic fistula    Assessment: Poorly functioning left arm fistula Plan: The patient will undergo a fistulogram and intervention is indicated.  Eldridge Abrahams, M.D. Vascular and Vein Specialists of  [place] Office: [phone] Pager:  [phone]

## 2012-07-14 NOTE — Op Note (Signed)
OPERATIVE REPORT  Date of Surgery: 07/14/2012  Surgeon: Tinnie Gens, MD  Assistant: Nurse Pre-op Diagnosis: End stage renal failure  Post-op Diagnosis: End Stage Renal Failure  Procedure: Procedure(s): INSERTION OF DIALYSIS CATHETER-right IJ-19 cm Bilateral ultrasound localization internal jugular veins Anesthesia: General  EBL: Minimal  Complications: None Patient was taken to the operating room placed in the supine position at which time upper chest and neck was exposed. Both internal jugular veins were imaged using B-mode ultrasound and noted to be widely patent. After prepping and draping in routine sterile manner the right IJ was entered using a supraclavicular approach guidewire passed into the right atrium under fluoroscopic guidance. After dilating the track appropriately a 19 cm hemodialysis cuffed catheter was passed through peel-away sheath tunneled peripherally and secured with nylon sutures. The wound was closed with Vicryl in a subcuticular fashion sterile dressing applied patient taken to the recovering in stable condition for a chest x-ray     Tinnie Gens, MD 07/14/2012 10:02 AM

## 2012-07-14 NOTE — Anesthesia Postprocedure Evaluation (Signed)
  Anesthesia Post-op Note  Patient: [name]  Procedure(s) Performed: Procedure(s) (LRB) with comments: INSERTION OF DIALYSIS CATHETER (Right) - Ultrasound Guided Insertion of Internal Jugular Dialysis Catheter  Patient Location: PACU  Anesthesia Type:MAC  Level of Consciousness: awake, alert  and oriented  Airway and Oxygen Therapy: Patient Spontanous Breathing  Post-op Pain: none  Post-op Assessment: Post-op Vital signs reviewed  Post-op Vital Signs: Reviewed and stable  Complications: No apparent anesthesia complications

## 2012-07-15 DIAGNOSIS — R509 Fever, unspecified: Secondary | ICD-10-CM | POA: Diagnosis not present

## 2012-07-15 DIAGNOSIS — N186 End stage renal disease: Secondary | ICD-10-CM | POA: Diagnosis not present

## 2012-07-15 DIAGNOSIS — N2581 Secondary hyperparathyroidism of renal origin: Secondary | ICD-10-CM | POA: Diagnosis not present

## 2012-07-15 DIAGNOSIS — N039 Chronic nephritic syndrome with unspecified morphologic changes: Secondary | ICD-10-CM | POA: Diagnosis not present

## 2012-07-15 DIAGNOSIS — E875 Hyperkalemia: Secondary | ICD-10-CM | POA: Diagnosis not present

## 2012-07-17 ENCOUNTER — Encounter (HOSPITAL_COMMUNITY): Payer: Self-pay | Admitting: Vascular Surgery

## 2012-07-17 ENCOUNTER — Telehealth: Payer: Self-pay | Admitting: Vascular Surgery

## 2012-07-17 ENCOUNTER — Other Ambulatory Visit: Payer: Self-pay | Admitting: *Deleted

## 2012-07-17 DIAGNOSIS — D631 Anemia in chronic kidney disease: Secondary | ICD-10-CM | POA: Diagnosis not present

## 2012-07-17 DIAGNOSIS — N2581 Secondary hyperparathyroidism of renal origin: Secondary | ICD-10-CM | POA: Diagnosis not present

## 2012-07-17 DIAGNOSIS — N186 End stage renal disease: Secondary | ICD-10-CM | POA: Diagnosis not present

## 2012-07-17 DIAGNOSIS — Z01810 Encounter for preprocedural cardiovascular examination: Secondary | ICD-10-CM

## 2012-07-17 DIAGNOSIS — R509 Fever, unspecified: Secondary | ICD-10-CM | POA: Diagnosis not present

## 2012-07-17 LAB — GLUCOSE, CAPILLARY: Glucose-Capillary: 130 mg/dL — ABNORMAL HIGH (ref 70–99)

## 2012-07-17 NOTE — Telephone Encounter (Signed)
Message copied by Gena Fray on Mon Jul 17, 2012 12:11 PM ------      Message from: Alfonso Patten      Created: Mon Jul 17, 2012  9:49 AM                   ----- Message -----         From: Mal Misty, MD         Sent: 07/14/2012  10:04 AM           To: Alfonso Patten, RN            Judy      This patient needs appointment for evaluation for new AV fistula with bilateral upper extremity vein mapping in the next few weeks. He will need to be seen on a Tuesday or Thursday by any physician

## 2012-07-17 NOTE — Telephone Encounter (Signed)
lvm for patient at home #- also sent letter, dpm

## 2012-07-20 ENCOUNTER — Encounter: Payer: Medicare Other | Admitting: Internal Medicine

## 2012-08-01 ENCOUNTER — Telehealth: Payer: Self-pay | Admitting: Cardiology

## 2012-08-01 NOTE — Telephone Encounter (Signed)
Left message to call.

## 2012-08-01 NOTE — Telephone Encounter (Signed)
They want to discuss pt having ace/arb

## 2012-08-01 NOTE — Telephone Encounter (Signed)
Silver script called no answer.[place].

## 2012-08-02 ENCOUNTER — Encounter: Payer: Self-pay | Admitting: Vascular Surgery

## 2012-08-02 NOTE — Telephone Encounter (Signed)
Left message on voicemail, pt is unable to take meds due to hypotension.

## 2012-08-03 ENCOUNTER — Encounter: Payer: Self-pay | Admitting: Vascular Surgery

## 2012-08-03 ENCOUNTER — Encounter: Payer: Self-pay | Admitting: Internal Medicine

## 2012-08-03 ENCOUNTER — Ambulatory Visit (INDEPENDENT_AMBULATORY_CARE_PROVIDER_SITE_OTHER): Payer: Medicare Other | Admitting: Vascular Surgery

## 2012-08-03 ENCOUNTER — Ambulatory Visit (INDEPENDENT_AMBULATORY_CARE_PROVIDER_SITE_OTHER): Payer: Medicare Other | Admitting: Internal Medicine

## 2012-08-03 ENCOUNTER — Encounter (INDEPENDENT_AMBULATORY_CARE_PROVIDER_SITE_OTHER): Payer: Medicare Other | Admitting: Vascular Surgery

## 2012-08-03 VITALS — BP 128/85 | HR 81 | Ht 65.0 in | Wt 167.4 lb

## 2012-08-03 VITALS — BP 117/78 | HR 78 | Temp 97.9°F | Ht 65.0 in | Wt 168.4 lb

## 2012-08-03 DIAGNOSIS — N186 End stage renal disease: Secondary | ICD-10-CM

## 2012-08-03 DIAGNOSIS — L219 Seborrheic dermatitis, unspecified: Secondary | ICD-10-CM | POA: Insufficient documentation

## 2012-08-03 DIAGNOSIS — E118 Type 2 diabetes mellitus with unspecified complications: Secondary | ICD-10-CM

## 2012-08-03 DIAGNOSIS — E1165 Type 2 diabetes mellitus with hyperglycemia: Secondary | ICD-10-CM | POA: Diagnosis not present

## 2012-08-03 DIAGNOSIS — Z01810 Encounter for preprocedural cardiovascular examination: Secondary | ICD-10-CM

## 2012-08-03 DIAGNOSIS — R05 Cough: Secondary | ICD-10-CM

## 2012-08-03 DIAGNOSIS — R059 Cough, unspecified: Secondary | ICD-10-CM | POA: Diagnosis not present

## 2012-08-03 LAB — GLUCOSE, CAPILLARY: Glucose-Capillary: 210 mg/dL — ABNORMAL HIGH (ref 70–99)

## 2012-08-03 MED ORDER — TRIAMCINOLONE ACETONIDE 0.025 % EX OINT: TOPICAL_OINTMENT | Freq: Two times a day (BID) | CUTANEOUS | Status: DC

## 2012-08-03 MED ORDER — GUAIFENESIN 100 MG/5ML PO SYRP: 200.0000 mg | ORAL_SOLUTION | Freq: Three times a day (TID) | ORAL | Status: DC | PRN

## 2012-08-03 MED ORDER — GLIPIZIDE ER 10 MG PO TB24: 10.0000 mg | ORAL_TABLET | Freq: Every day | ORAL | Status: DC

## 2012-08-03 MED ORDER — KETOCONAZOLE 2 % EX SHAM: MEDICATED_SHAMPOO | CUTANEOUS | Status: DC

## 2012-08-03 NOTE — Assessment & Plan Note (Addendum)
Patient did not bring glucometer today, but notes blood sugars in the 210's average.  CBG is 210 today. -increase glipizide to 10 mg XR daily -patient will likely eventually need insulin, but would prefer to try orals for now if possible -foot exam reveals mildly decreased sensation to pinprick in right foot, and callouses on both feet, with no ulcerations. -sent prescription for diabetic shoes/inserts

## 2012-08-03 NOTE — Progress Notes (Signed)
[facility] [place] HISTORY AND PHYSICAL   History of Present Illness:  Patient is a 41 y.o. year old male who presents for placement of a permanent hemodialysis access. He previously had a left arm AV fistula which is now occluded. The patient is right handed .  The patient is currently on hemodialysis.  He is currently dialyzing via a catheter Monday Wednesday and Friday at [facility].  Other chronic medical problems include diabetes cardiomyopathy, hypotension and anemia. All these are currently stable.  Past Medical History  Diagnosis Date  . Non-ischemic cardiomyopathy     EF 20-25% 12/23/2008>>EF 40-45% 03/2009>>EF ??? 7/?/2011  . ESRD (end stage renal disease) on dialysis     1.Initiated HD via right per cate 12/30/2008 (MWF schedule at [facility]) 2.Left upper extremity A-V fistula by Dr Oneida Alar 12/30/2008 3.Aemia: Received Venofer 12/30/08 Ferritin 179 % sat 12 12/23/08, 4. Secondary Hyperparathyroidism- PTH 188, P 5.4, Ca 9.2 12/2008  . Diabetes mellitus 12/2008    A1C 8.3 12/23/08, 24hr urine protein 2793 mg/day, SPEP neg, proliferative BDR s/p photocoagulation 08/2009 done at [facility] and f/u by DR Groat  . Hypertension     resloved after initating HD, now with post HD hypotension  . Hemodialysis-associated hypotension     coreg d/c'd  . Anemia   . CHF (congestive heart failure)     Past Surgical History  Procedure Date  . Cataract extraction, bilateral   . Av fistula placement     left  . Photocoagulation     for diabetic retinopathy 08/1999  . Insertion of dialysis catheter 07/14/2012    Procedure: INSERTION OF DIALYSIS CATHETER;  Surgeon: Mal Misty, MD;  Location: [facility];  Service: Vascular;  Laterality: Right;  Ultrasound Guided Insertion of Internal Jugular Dialysis Catheter     Social History History  Substance Use Topics  . Smoking status: Never Smoker   . Smokeless tobacco: Never Used  . Alcohol Use: No    Family History Family  History  Problem Relation Age of Onset  . Hypertension Mother   . Diabetes Father   . Kidney disease Father   . Other Father     amputation  . Coronary artery disease Neg Hx     no hx of CHF, sudden cardiac death, or CAD  . Hypertension Brother   . Other Brother     bleeding problems    Allergies  Allergies  Allergen Reactions  . Tape Other (See Comments)    Only tolerated paper tape     Current Outpatient Prescriptions  Medication Sig Dispense Refill  . aspirin EC 81 MG tablet Take 81 mg by mouth daily.      . B Complex-C-Folic Acid (RENA-VITE PO) Take 1 tablet by mouth daily.      . Blood Glucose Monitoring Suppl (FREESTYLE FREEDOM LITE) W/DEVICE KIT Check blood sugar once daily as instructed dx code 250.00  1 each  0  . calcium carbonate (OS-CAL - DOSED IN MG OF ELEMENTAL CALCIUM) 1250 MG tablet Take 1 tablet by mouth 3 (three) times daily with meals.      . carvedilol (COREG) 6.25 MG tablet Take 6.25 mg by mouth See admin instructions. Takes twice daily except on dialysis days (MWF) only takes evening dose (no morning dose)      . clotrimazole (LOTRIMIN) 1 % cream Apply topically 2 (two) times daily.  60 g  1  . clotrimazole (LOTRIMIN) 1 % cream Apply 1 application topically  2 (two) times daily. Apply to both feet      . docusate sodium (DULCOLAX) 100 MG capsule Take 100 mg by mouth daily as needed. For constipation      . glucose blood (FREESTYLE LITE) test strip Check blood sugar once daily as instructed dx code 250.00  32 each  5  . Lancets 30G MISC Use to check blood sugar once daily as instructed dx code 250.00  100 each  5  . promethazine (PHENERGAN) 25 MG tablet Take 25 mg by mouth every 8 (eight) hours as needed. For nausea/vomiting      . glipiZIDE (GLUCOTROL XL) 10 MG 24 hr tablet Take 1 tablet (10 mg total) by mouth daily.  30 tablet  11  . guaifenesin (ROBITUSSIN) 100 MG/5ML syrup Take 10 mLs (200 mg total) by mouth 3 (three) times daily as needed for cough.  240  mL  1  . ketoconazole (NIZORAL) 2 % shampoo Apply topically 2 (two) times a week. Wash hair and beard.  Leave shampoo on skin for 5 mins before washing off.  120 mL  1  . triamcinolone (KENALOG) 0.025 % ointment Apply topically 2 (two) times daily. Apply to nose and beard, in areas of dry skin  80 g  1    ROS:   General:  No weight loss, Fever, chills  HEENT: No recent headaches, no nasal bleeding, no visual changes, no sore throat  Neurologic: No dizziness, blackouts, seizures. No recent symptoms of stroke or mini- stroke. No recent episodes of slurred speech, or temporary blindness.  Cardiac: No recent episodes of chest pain/pressure, no shortness of breath at rest.  No shortness of breath with exertion.  Denies history of atrial fibrillation or irregular heartbeat  Vascular: No history of rest pain in feet.  No history of claudication.  No history of non-healing ulcer, No history of DVT   Pulmonary: No home oxygen, no productive cough, no hemoptysis,  No asthma or wheezing  Musculoskeletal:  [ ]  Arthritis, [ ]  Low back pain,  [ ]  Joint pain  Hematologic:No history of hypercoagulable state.  No history of easy bleeding.  No history of anemia  Gastrointestinal: No hematochezia or melena,  No gastroesophageal reflux, no trouble swallowing  Urinary: [ ]  chronic Kidney disease, [ ]  on HD - [ ]  MWF or [ ]  TTHS, [ ]  Burning with urination, [ ]  Frequent urination, [ ]  Difficulty urinating;   Skin: No rashes  Psychological: No history of anxiety,  No history of depression   Physical Examination  Filed Vitals:   08/03/12 1426  BP: 128/85  Pulse: 81  Height: 5\' 5"  (1.651 m)  Weight: 167 lb 6.4 oz (75.932 kg)  SpO2: 100%    Body mass index is 27.86 kg/(m^2).  General:  Alert and oriented, no acute distress HEENT: Normal Neck: No bruit or JVD Pulmonary: Clear to auscultation bilaterally Cardiac: Regular Rate and Rhythm without murmur Skin: No rash Extremity Pulses:  2+  radial, brachial pulses bilaterally, occluded aneurysmal left upper arm AV fistula Musculoskeletal: No deformity or edema  Neurologic: Upper and lower extremity motor 5/5 and symmetric  DATA: Bilateral upper extremity vein mapping was performed today. The basilic vein in the left arm is fairly small and probably not usable for fistula. The upper arm cephalic vein on the right side is of reasonable quality between 3 and 5 mm in diameter.   ASSESSMENT: Needs hemodialysis access. I discussed with the patient today the possibility of switching to  his right arm but he would prefer to keep access in his left arm at this point.   PLAN:  Left arm AV graft scheduled for 08/22/2012. Risks benefits possible complications and procedure details were discussed with the patient today. These include but are not limited to bleeding infection graft thrombosis.  Ruta Hinds, MD [facility] [place] Office: [phone] Pager: [phone]

## 2012-08-03 NOTE — Assessment & Plan Note (Signed)
Patient notes non-productive cough for the last 3 weeks, with no sore throat, rhinorrhea, or congestion.  Lungs clear bilaterally.  No fevers. -diabetic cough syrup prescribed -if patient develops fever or purulent cough, can consider antibiotics

## 2012-08-03 NOTE — Patient Instructions (Signed)
General Instructions: For your dry skin around your scalp and beard, use Ketoconazole shampoo twice per week.  After applying the shampoo, leave the shampoo on your skin for 5 minutes before washing off. -for the more bothersome areas of dry skin on your face and scalp, you can use Triamcinolone cream twice per day as needed.  Do not use for more than 1 week per month.  For your diabetes, we are increasing your Glipizide to 10 mg once per day.  For your cough, we have prescribed a diabetic cough syrup.  If you develop fever or cough productive of green sputum, call our clinic.  Please return for a follow-up visit in 2-3 months.   Treatment Goals:  Goals (1 Years of Data) as of 08/03/2012    None      Progress Toward Treatment Goals:  Treatment Goal 08/03/2012  Hemoglobin A1C improved  Blood pressure at goal    Self Care Goals & Plans:  Self Care Goal 08/03/2012  Manage my medications bring my medications to every visit  Monitor my health bring my glucose meter and log to each visit  Eat healthy foods drink diet soda or water instead of juice or soda    Home Blood Glucose Monitoring 08/03/2012  Check my blood sugar 2 times a day  When to check my blood sugar before breakfast; before dinner     Care Management & Community Referrals:  Referral 08/03/2012  Referrals made for care management support none needed

## 2012-08-03 NOTE — Assessment & Plan Note (Signed)
Seb derm noted around nose, beardline, and scalp -ketoconazole shampoo twice per week -low-potency triamcinolone cream BID for no more than 1 week at a time -if unsuccessful, consider dermatology referral

## 2012-08-03 NOTE — Progress Notes (Signed)
HPI The patient is a 41 y.o. male with a history of DM, ESRD, anemia of chronic disease, presenting for a follow-up visit for DM.  The patient has a history of DM.  Did not bring glucometer today.  The patient notes that his blood sugars at home have been average around 210, with a range of 115-287.  The patient notes consistent 3 meals/day.  He notes no episodes of hypoglycemia since our last visit.  He notes no tremulousness, diaphoresis, AMS, blurry vision.  CBG today in clinic is 210.  The patient was prescribed diabetic shoes/inserts at his last visit, but has not received these.  Healing ulcer at last visit has resolved.  The patient notes a cough for the last 2 weeks, non-productive.  He notes no fevers, chills.  He notes no sick contacts, but he does go to HD.  No rhinorrhea, nasal congestion, or sore throat.  The patient notes dry skin, particularly around the scalp and around nose and beard.  He was wondering about seeing a dermatologist.  The patient tried "Head and Shoulders", which did not help.  ROS: General: no fevers, chills, changes in weight, changes in appetite Skin: no rash HEENT: no blurry vision, hearing changes, sore throat Pulm: no dyspnea, wheezing CV: no chest pain, palpitations, shortness of breath Abd: no abdominal pain, nausea/vomiting, diarrhea/constipation GU: no dysuria, hematuria, polyuria Ext: no arthralgias, myalgias Neuro: no weakness, numbness, or tingling  Filed Vitals:   08/03/12 1542  BP: 117/78  Pulse: 78  Temp: 97.9 F (36.6 C)    PEX General: alert, cooperative, and in no apparent distress HEENT: PERRL, EOMI, oropharynx clear and non-erythematous.  Scalp, nose, and beard line with patches of dry, flakey, somewhat hypopigmented skin Neck: supple, no lymphadenopathy Lungs: clear to ascultation bilaterally, normal work of respiration, no wheezes, rales, ronchi Heart: regular rate and rhythm, no murmurs, gallops, or rubs Abdomen: soft,  non-tender, non-distended, normal bowel sounds Extremities: 2+ DP/PT pulses bilaterally, no cyanosis, clubbing, or edema.  Callous noted at plantar surface of L 1st MTP, and right plantar surface of lateral 5th MTP.  No ulceration noted. Neurologic: alert & oriented X3, cranial nerves II-XII intact, strength grossly intact, sensation diminished to pinprick in left foot to the level of the ankle.  Sensation intact to pinprick in right foot.  Current Outpatient Prescriptions on File Prior to Visit  Medication Sig Dispense Refill  . aspirin EC 81 MG tablet Take 81 mg by mouth daily.      . B Complex-C-Folic Acid (RENA-VITE PO) Take 1 tablet by mouth daily.      . Blood Glucose Monitoring Suppl (FREESTYLE FREEDOM LITE) W/DEVICE KIT Check blood sugar once daily as instructed dx code 250.00  1 each  0  . calcium carbonate (OS-CAL - DOSED IN MG OF ELEMENTAL CALCIUM) 1250 MG tablet Take 1 tablet by mouth 3 (three) times daily with meals.      . carvedilol (COREG) 6.25 MG tablet Take 6.25 mg by mouth See admin instructions. Takes twice daily except on dialysis days (MWF) only takes evening dose (no morning dose)      . clotrimazole (LOTRIMIN) 1 % cream Apply topically 2 (two) times daily.  60 g  1  . clotrimazole (LOTRIMIN) 1 % cream Apply 1 application topically 2 (two) times daily. Apply to both feet      . docusate sodium (DULCOLAX) 100 MG capsule Take 100 mg by mouth daily as needed. For constipation      .  glipiZIDE (GLUCOTROL XL) 5 MG 24 hr tablet Take 5 mg by mouth daily.      Marland Kitchen glucose blood (FREESTYLE LITE) test strip Check blood sugar once daily as instructed dx code 250.00  32 each  5  . Lancets 30G MISC Use to check blood sugar once daily as instructed dx code 250.00  100 each  5  . promethazine (PHENERGAN) 25 MG tablet Take 25 mg by mouth every 8 (eight) hours as needed. For nausea/vomiting        Assessment/Plan

## 2012-08-07 ENCOUNTER — Telehealth: Payer: Self-pay | Admitting: *Deleted

## 2012-08-07 DIAGNOSIS — N059 Unspecified nephritic syndrome with unspecified morphologic changes: Secondary | ICD-10-CM | POA: Diagnosis not present

## 2012-08-07 DIAGNOSIS — Z9109 Other allergy status, other than to drugs and biological substances: Secondary | ICD-10-CM | POA: Diagnosis not present

## 2012-08-07 DIAGNOSIS — E1142 Type 2 diabetes mellitus with diabetic polyneuropathy: Secondary | ICD-10-CM | POA: Diagnosis present

## 2012-08-07 DIAGNOSIS — I120 Hypertensive chronic kidney disease with stage 5 chronic kidney disease or end stage renal disease: Secondary | ICD-10-CM | POA: Diagnosis not present

## 2012-08-07 DIAGNOSIS — I509 Heart failure, unspecified: Secondary | ICD-10-CM | POA: Diagnosis present

## 2012-08-07 DIAGNOSIS — Z79899 Other long term (current) drug therapy: Secondary | ICD-10-CM | POA: Diagnosis not present

## 2012-08-07 DIAGNOSIS — N2889 Other specified disorders of kidney and ureter: Secondary | ICD-10-CM | POA: Diagnosis not present

## 2012-08-07 DIAGNOSIS — T82898A Other specified complication of vascular prosthetic devices, implants and grafts, initial encounter: Secondary | ICD-10-CM | POA: Diagnosis present

## 2012-08-07 DIAGNOSIS — N039 Chronic nephritic syndrome with unspecified morphologic changes: Secondary | ICD-10-CM | POA: Diagnosis not present

## 2012-08-07 DIAGNOSIS — Z452 Encounter for adjustment and management of vascular access device: Secondary | ICD-10-CM | POA: Diagnosis not present

## 2012-08-07 DIAGNOSIS — J9819 Other pulmonary collapse: Secondary | ICD-10-CM | POA: Diagnosis not present

## 2012-08-07 DIAGNOSIS — E1149 Type 2 diabetes mellitus with other diabetic neurological complication: Secondary | ICD-10-CM | POA: Diagnosis present

## 2012-08-07 DIAGNOSIS — I428 Other cardiomyopathies: Secondary | ICD-10-CM | POA: Diagnosis present

## 2012-08-07 DIAGNOSIS — Z09 Encounter for follow-up examination after completed treatment for conditions other than malignant neoplasm: Secondary | ICD-10-CM | POA: Diagnosis not present

## 2012-08-07 DIAGNOSIS — E1129 Type 2 diabetes mellitus with other diabetic kidney complication: Secondary | ICD-10-CM | POA: Diagnosis present

## 2012-08-07 DIAGNOSIS — I2510 Atherosclerotic heart disease of native coronary artery without angina pectoris: Secondary | ICD-10-CM | POA: Diagnosis present

## 2012-08-07 DIAGNOSIS — E11359 Type 2 diabetes mellitus with proliferative diabetic retinopathy without macular edema: Secondary | ICD-10-CM | POA: Diagnosis present

## 2012-08-07 DIAGNOSIS — Z01818 Encounter for other preprocedural examination: Secondary | ICD-10-CM | POA: Diagnosis not present

## 2012-08-07 DIAGNOSIS — K929 Disease of digestive system, unspecified: Secondary | ICD-10-CM | POA: Diagnosis not present

## 2012-08-07 DIAGNOSIS — E871 Hypo-osmolality and hyponatremia: Secondary | ICD-10-CM | POA: Diagnosis not present

## 2012-08-07 DIAGNOSIS — Z940 Kidney transplant status: Secondary | ICD-10-CM | POA: Diagnosis not present

## 2012-08-07 DIAGNOSIS — Z7682 Awaiting organ transplant status: Secondary | ICD-10-CM | POA: Diagnosis not present

## 2012-08-07 DIAGNOSIS — Z48298 Encounter for aftercare following other organ transplant: Secondary | ICD-10-CM | POA: Diagnosis not present

## 2012-08-07 DIAGNOSIS — E875 Hyperkalemia: Secondary | ICD-10-CM | POA: Diagnosis not present

## 2012-08-07 DIAGNOSIS — E119 Type 2 diabetes mellitus without complications: Secondary | ICD-10-CM | POA: Diagnosis not present

## 2012-08-07 DIAGNOSIS — D631 Anemia in chronic kidney disease: Secondary | ICD-10-CM | POA: Diagnosis not present

## 2012-08-07 DIAGNOSIS — N186 End stage renal disease: Secondary | ICD-10-CM | POA: Diagnosis present

## 2012-08-07 DIAGNOSIS — Z01810 Encounter for preprocedural cardiovascular examination: Secondary | ICD-10-CM | POA: Diagnosis not present

## 2012-08-07 DIAGNOSIS — Z9849 Cataract extraction status, unspecified eye: Secondary | ICD-10-CM | POA: Diagnosis not present

## 2012-08-07 DIAGNOSIS — D62 Acute posthemorrhagic anemia: Secondary | ICD-10-CM | POA: Diagnosis not present

## 2012-08-07 DIAGNOSIS — Z992 Dependence on renal dialysis: Secondary | ICD-10-CM | POA: Diagnosis not present

## 2012-08-07 DIAGNOSIS — E872 Acidosis: Secondary | ICD-10-CM | POA: Diagnosis not present

## 2012-08-07 DIAGNOSIS — D696 Thrombocytopenia, unspecified: Secondary | ICD-10-CM | POA: Diagnosis not present

## 2012-08-07 DIAGNOSIS — E1139 Type 2 diabetes mellitus with other diabetic ophthalmic complication: Secondary | ICD-10-CM | POA: Diagnosis present

## 2012-08-07 DIAGNOSIS — T8610 Unspecified complication of kidney transplant: Secondary | ICD-10-CM | POA: Diagnosis not present

## 2012-08-07 NOTE — Telephone Encounter (Signed)
Pt's wife called to say he was having a kidney transplant today. She was cancelling surgery for 08-22-12.

## 2012-08-11 HISTORY — PX: NEPHRECTOMY TRANSPLANTED ORGAN: SUR880

## 2012-08-14 DIAGNOSIS — Z940 Kidney transplant status: Secondary | ICD-10-CM | POA: Diagnosis not present

## 2012-08-14 DIAGNOSIS — Z48298 Encounter for aftercare following other organ transplant: Secondary | ICD-10-CM | POA: Diagnosis not present

## 2012-08-15 DIAGNOSIS — N186 End stage renal disease: Secondary | ICD-10-CM | POA: Diagnosis not present

## 2012-08-18 DIAGNOSIS — D509 Iron deficiency anemia, unspecified: Secondary | ICD-10-CM | POA: Diagnosis not present

## 2012-08-18 DIAGNOSIS — E876 Hypokalemia: Secondary | ICD-10-CM | POA: Diagnosis not present

## 2012-08-18 DIAGNOSIS — D631 Anemia in chronic kidney disease: Secondary | ICD-10-CM | POA: Diagnosis not present

## 2012-08-18 DIAGNOSIS — N2581 Secondary hyperparathyroidism of renal origin: Secondary | ICD-10-CM | POA: Diagnosis not present

## 2012-08-18 DIAGNOSIS — N186 End stage renal disease: Secondary | ICD-10-CM | POA: Diagnosis not present

## 2012-08-22 ENCOUNTER — Ambulatory Visit: Admit: 2012-08-22 | Payer: Medicare Other | Admitting: Vascular Surgery

## 2012-08-22 SURGERY — INSERTION OF ARTERIOVENOUS (AV) GORE-TEX GRAFT ARM
Anesthesia: Monitor Anesthesia Care | Site: Arm Upper | Laterality: Left

## 2012-08-23 ENCOUNTER — Other Ambulatory Visit: Payer: Self-pay | Admitting: Internal Medicine

## 2012-08-24 DIAGNOSIS — N186 End stage renal disease: Secondary | ICD-10-CM | POA: Diagnosis not present

## 2012-08-24 DIAGNOSIS — Z48298 Encounter for aftercare following other organ transplant: Secondary | ICD-10-CM | POA: Diagnosis not present

## 2012-08-24 DIAGNOSIS — Z940 Kidney transplant status: Secondary | ICD-10-CM | POA: Diagnosis not present

## 2012-08-24 DIAGNOSIS — D6859 Other primary thrombophilia: Secondary | ICD-10-CM | POA: Diagnosis not present

## 2012-08-25 ENCOUNTER — Other Ambulatory Visit: Payer: Self-pay | Admitting: *Deleted

## 2012-08-28 ENCOUNTER — Encounter (HOSPITAL_COMMUNITY): Payer: Self-pay | Admitting: Pharmacy Technician

## 2012-09-06 DIAGNOSIS — E119 Type 2 diabetes mellitus without complications: Secondary | ICD-10-CM | POA: Diagnosis not present

## 2012-09-11 ENCOUNTER — Encounter (HOSPITAL_COMMUNITY): Payer: Self-pay | Admitting: *Deleted

## 2012-09-11 MED ORDER — CEFUROXIME SODIUM 1.5 G IJ SOLR
1.5000 g | INTRAMUSCULAR | Status: AC
  Administered 2012-09-12: 1.5 g via INTRAVENOUS
  Filled 2012-09-11: qty 1.5

## 2012-09-12 ENCOUNTER — Encounter (HOSPITAL_COMMUNITY): Payer: Self-pay | Admitting: Anesthesiology

## 2012-09-12 ENCOUNTER — Ambulatory Visit (HOSPITAL_COMMUNITY)
Admission: RE | Admit: 2012-09-12 | Discharge: 2012-09-12 | Disposition: A | Discharge status: Home / Self Care | Payer: Medicare Other | Source: Ambulatory Visit | Attending: Vascular Surgery | Admitting: Vascular Surgery

## 2012-09-12 ENCOUNTER — Encounter (HOSPITAL_COMMUNITY)
Admission: RE | Disposition: A | Discharge status: Home / Self Care | Payer: Self-pay | Source: Ambulatory Visit | Attending: Vascular Surgery

## 2012-09-12 ENCOUNTER — Encounter (HOSPITAL_COMMUNITY): Payer: Self-pay | Admitting: Surgery

## 2012-09-12 ENCOUNTER — Ambulatory Visit (HOSPITAL_COMMUNITY): Payer: Medicare Other | Admitting: Anesthesiology

## 2012-09-12 DIAGNOSIS — N186 End stage renal disease: Secondary | ICD-10-CM

## 2012-09-12 DIAGNOSIS — Z833 Family history of diabetes mellitus: Secondary | ICD-10-CM | POA: Insufficient documentation

## 2012-09-12 DIAGNOSIS — E119 Type 2 diabetes mellitus without complications: Secondary | ICD-10-CM | POA: Insufficient documentation

## 2012-09-12 DIAGNOSIS — Y832 Surgical operation with anastomosis, bypass or graft as the cause of abnormal reaction of the patient, or of later complication, without mention of misadventure at the time of the procedure: Secondary | ICD-10-CM | POA: Insufficient documentation

## 2012-09-12 DIAGNOSIS — Z841 Family history of disorders of kidney and ureter: Secondary | ICD-10-CM | POA: Diagnosis not present

## 2012-09-12 DIAGNOSIS — Z8249 Family history of ischemic heart disease and other diseases of the circulatory system: Secondary | ICD-10-CM | POA: Insufficient documentation

## 2012-09-12 DIAGNOSIS — I120 Hypertensive chronic kidney disease with stage 5 chronic kidney disease or end stage renal disease: Secondary | ICD-10-CM | POA: Insufficient documentation

## 2012-09-12 DIAGNOSIS — I509 Heart failure, unspecified: Secondary | ICD-10-CM | POA: Insufficient documentation

## 2012-09-12 DIAGNOSIS — D649 Anemia, unspecified: Secondary | ICD-10-CM | POA: Diagnosis not present

## 2012-09-12 DIAGNOSIS — Z992 Dependence on renal dialysis: Secondary | ICD-10-CM | POA: Diagnosis not present

## 2012-09-12 DIAGNOSIS — I428 Other cardiomyopathies: Secondary | ICD-10-CM | POA: Insufficient documentation

## 2012-09-12 DIAGNOSIS — T82898A Other specified complication of vascular prosthetic devices, implants and grafts, initial encounter: Secondary | ICD-10-CM | POA: Insufficient documentation

## 2012-09-12 DIAGNOSIS — Z9849 Cataract extraction status, unspecified eye: Secondary | ICD-10-CM | POA: Insufficient documentation

## 2012-09-12 HISTORY — PX: AV FISTULA PLACEMENT: SHX1204

## 2012-09-12 HISTORY — DX: Personal history of other medical treatment: Z92.89

## 2012-09-12 HISTORY — DX: Psoriasis, unspecified: L40.9

## 2012-09-12 LAB — POCT I-STAT 4, (NA,K, GLUC, HGB,HCT): HCT: 31 % — ABNORMAL LOW (ref 39.0–52.0)

## 2012-09-12 LAB — SURGICAL PCR SCREEN: Staphylococcus aureus: NEGATIVE

## 2012-09-12 LAB — GLUCOSE, CAPILLARY
Glucose-Capillary: 104 mg/dL — ABNORMAL HIGH (ref 70–99)
Glucose-Capillary: 85 mg/dL (ref 70–99)

## 2012-09-12 LAB — PROTIME-INR: INR: 0.96 (ref 0.00–1.49)

## 2012-09-12 SURGERY — INSERTION OF ARTERIOVENOUS (AV) GORE-TEX GRAFT ARM
Anesthesia: General | Site: Arm Lower | Laterality: Left | Wound class: Clean

## 2012-09-12 MED ORDER — LIDOCAINE HCL (PF) 1 % IJ SOLN
INTRAMUSCULAR | Status: DC | PRN
  Administered 2012-09-12: 30 mL
  Administered 2012-09-12: 12 mL

## 2012-09-12 MED ORDER — MEPERIDINE HCL 25 MG/ML IJ SOLN: 6.2500 mg | INTRAMUSCULAR | Status: DC | PRN

## 2012-09-12 MED ORDER — LIDOCAINE HCL (PF) 1 % IJ SOLN
INTRAMUSCULAR | Status: AC
  Filled 2012-09-12: qty 30

## 2012-09-12 MED ORDER — SODIUM CHLORIDE 0.9 % IV SOLN
INTRAVENOUS | Status: DC | PRN
  Administered 2012-09-12 (×2): via INTRAVENOUS

## 2012-09-12 MED ORDER — PROPOFOL INFUSION 10 MG/ML OPTIME
INTRAVENOUS | Status: DC | PRN
  Administered 2012-09-12: 25 ug/kg/min via INTRAVENOUS

## 2012-09-12 MED ORDER — ONDANSETRON HCL 4 MG/2ML IJ SOLN
INTRAMUSCULAR | Status: DC | PRN
  Administered 2012-09-12: 4 mg via INTRAVENOUS

## 2012-09-12 MED ORDER — HYDROMORPHONE HCL PF 1 MG/ML IJ SOLN: 0.2500 mg | INTRAMUSCULAR | Status: DC | PRN

## 2012-09-12 MED ORDER — SODIUM CHLORIDE 0.9 % IV SOLN: INTRAVENOUS | Status: DC

## 2012-09-12 MED ORDER — OXYCODONE HCL 5 MG PO TABS: 5.0000 mg | ORAL_TABLET | Freq: Once | ORAL | Status: DC | PRN

## 2012-09-12 MED ORDER — DEXTROSE 5 % IV SOLN
INTRAVENOUS | Status: DC | PRN
  Administered 2012-09-12: 08:00:00 via INTRAVENOUS

## 2012-09-12 MED ORDER — 0.9 % SODIUM CHLORIDE (POUR BTL) OPTIME
TOPICAL | Status: DC | PRN
  Administered 2012-09-12: 1000 mL

## 2012-09-12 MED ORDER — MIDAZOLAM HCL 5 MG/5ML IJ SOLN
INTRAMUSCULAR | Status: DC | PRN
  Administered 2012-09-12 (×2): 1 mg via INTRAVENOUS

## 2012-09-12 MED ORDER — FENTANYL CITRATE 0.05 MG/ML IJ SOLN
INTRAMUSCULAR | Status: DC | PRN
  Administered 2012-09-12 (×3): 50 ug via INTRAVENOUS

## 2012-09-12 MED ORDER — HEPARIN SODIUM (PORCINE) 1000 UNIT/ML IJ SOLN
INTRAMUSCULAR | Status: DC | PRN
  Administered 2012-09-12: 5000 [IU] via INTRAVENOUS

## 2012-09-12 MED ORDER — SODIUM CHLORIDE 0.9 % IR SOLN
Status: DC | PRN
  Administered 2012-09-12: 08:00:00

## 2012-09-12 MED ORDER — THROMBIN 20000 UNITS EX KIT
PACK | CUTANEOUS | Status: DC | PRN
  Administered 2012-09-12: 09:00:00 via TOPICAL

## 2012-09-12 MED ORDER — ONDANSETRON HCL 4 MG/2ML IJ SOLN: 4.0000 mg | Freq: Once | INTRAMUSCULAR | Status: DC | PRN

## 2012-09-12 MED ORDER — THROMBIN 20000 UNITS EX SOLR
CUTANEOUS | Status: AC
  Filled 2012-09-12: qty 20000

## 2012-09-12 MED ORDER — HYDROCODONE-ACETAMINOPHEN 5-325 MG PO TABS: 1.0000 | ORAL_TABLET | Freq: Four times a day (QID) | ORAL | Status: DC | PRN

## 2012-09-12 MED ORDER — OXYCODONE HCL 5 MG/5ML PO SOLN: 5.0000 mg | Freq: Once | ORAL | Status: DC | PRN

## 2012-09-12 MED ORDER — PROTAMINE SULFATE 10 MG/ML IV SOLN
INTRAVENOUS | Status: DC | PRN
  Administered 2012-09-12: 50 mg via INTRAVENOUS

## 2012-09-12 MED ORDER — MUPIROCIN 2 % EX OINT
TOPICAL_OINTMENT | Freq: Two times a day (BID) | CUTANEOUS | Status: DC
  Administered 2012-09-12: 1 via NASAL
  Filled 2012-09-12 (×2): qty 22

## 2012-09-12 SURGICAL SUPPLY — 44 items
CANISTER SUCTION 2500CC (MISCELLANEOUS) ×2 IMPLANT
CLIP TI MEDIUM 6 (CLIP) ×2 IMPLANT
CLIP TI WIDE RED SMALL 6 (CLIP) ×2 IMPLANT
CLOTH BEACON ORANGE TIMEOUT ST (SAFETY) ×2 IMPLANT
COVER SURGICAL LIGHT HANDLE (MISCELLANEOUS) ×2 IMPLANT
DECANTER SPIKE VIAL GLASS SM (MISCELLANEOUS) IMPLANT
DERMABOND ADVANCED (GAUZE/BANDAGES/DRESSINGS) ×2
DERMABOND ADVANCED .7 DNX12 (GAUZE/BANDAGES/DRESSINGS) ×2 IMPLANT
ELECT REM PT RETURN 9FT ADLT (ELECTROSURGICAL) ×2
ELECTRODE REM PT RTRN 9FT ADLT (ELECTROSURGICAL) ×1 IMPLANT
GAUZE SPONGE 2X2 8PLY STRL LF (GAUZE/BANDAGES/DRESSINGS) IMPLANT
GEL ULTRASOUND 20GR AQUASONIC (MISCELLANEOUS) IMPLANT
GLOVE BIO SURGEON STRL SZ 6.5 (GLOVE) ×4 IMPLANT
GLOVE BIO SURGEON STRL SZ7.5 (GLOVE) ×2 IMPLANT
GLOVE BIOGEL PI IND STRL 6.5 (GLOVE) ×2 IMPLANT
GLOVE BIOGEL PI IND STRL 7.5 (GLOVE) ×1 IMPLANT
GLOVE BIOGEL PI IND STRL 8 (GLOVE) ×1 IMPLANT
GLOVE BIOGEL PI IND STRL 8.5 (GLOVE) ×1 IMPLANT
GLOVE BIOGEL PI INDICATOR 6.5 (GLOVE) ×2
GLOVE BIOGEL PI INDICATOR 7.5 (GLOVE) ×1
GLOVE BIOGEL PI INDICATOR 8 (GLOVE) ×1
GLOVE BIOGEL PI INDICATOR 8.5 (GLOVE) ×1
GLOVE ECLIPSE 6.5 STRL STRAW (GLOVE) ×2 IMPLANT
GLOVE SURG SS PI 7.5 STRL IVOR (GLOVE) ×2 IMPLANT
GLOVE SURG SS PI 8.0 STRL IVOR (GLOVE) ×2 IMPLANT
GOWN PREVENTION PLUS XLARGE (GOWN DISPOSABLE) ×6 IMPLANT
GOWN STRL NON-REIN LRG LVL3 (GOWN DISPOSABLE) ×2 IMPLANT
GRAFT GORETEX STRT 6X50 (Vascular Products) ×2 IMPLANT
KIT BASIN OR (CUSTOM PROCEDURE TRAY) ×2 IMPLANT
KIT ROOM TURNOVER OR (KITS) ×2 IMPLANT
LOOP VESSEL MINI RED (MISCELLANEOUS) IMPLANT
NS IRRIG 1000ML POUR BTL (IV SOLUTION) ×2 IMPLANT
PACK CV ACCESS (CUSTOM PROCEDURE TRAY) ×2 IMPLANT
PAD ARMBOARD 7.5X6 YLW CONV (MISCELLANEOUS) ×4 IMPLANT
SPONGE GAUZE 2X2 STER 10/PKG (GAUZE/BANDAGES/DRESSINGS)
SPONGE SURGIFOAM ABS GEL 100 (HEMOSTASIS) ×2 IMPLANT
SUT PROLENE 6 0 CC (SUTURE) ×8 IMPLANT
SUT VIC AB 3-0 SH 27 (SUTURE) ×2
SUT VIC AB 3-0 SH 27X BRD (SUTURE) ×2 IMPLANT
SUT VICRYL 4-0 PS2 18IN ABS (SUTURE) ×6 IMPLANT
TOWEL OR 17X24 6PK STRL BLUE (TOWEL DISPOSABLE) ×2 IMPLANT
TOWEL OR 17X26 10 PK STRL BLUE (TOWEL DISPOSABLE) ×2 IMPLANT
UNDERPAD 30X30 INCONTINENT (UNDERPADS AND DIAPERS) ×2 IMPLANT
WATER STERILE IRR 1000ML POUR (IV SOLUTION) ×2 IMPLANT

## 2012-09-12 NOTE — Preoperative (Signed)
Beta Blockers   Reason not to administer Beta Blockers:Coreg 0430 today

## 2012-09-12 NOTE — Progress Notes (Signed)
Last CXR noted to be abnormal.  Dr. Glennon Mac notified of its results and stated to proceed on with the procedure.

## 2012-09-12 NOTE — Progress Notes (Signed)
Renal PA Ramiro Harvest updated, re procedure, potassium 4.5, pt sched for HD tomorrow at [facility].  No new orders. HD access record faxed to center.

## 2012-09-12 NOTE — Anesthesia Postprocedure Evaluation (Signed)
Anesthesia Post Note  Patient: [name]  Procedure(s) Performed: Procedure(s) (LRB): INSERTION OF ARTERIOVENOUS (AV) GORE-TEX GRAFT ARM (Left)  Anesthesia type: general  Patient location: PACU  Post pain: Pain level controlled  Post assessment: Patient's Cardiovascular Status Stable  Last Vitals:  Filed Vitals:   09/12/12 1043  BP: 137/79  Pulse: 81  Temp: 37 C  Resp: 18    Post vital signs: Reviewed and stable  Level of consciousness: sedated  Complications: No apparent anesthesia complications

## 2012-09-12 NOTE — Progress Notes (Signed)
09/12/12 0648  OBSTRUCTIVE SLEEP APNEA  Have you ever been diagnosed with sleep apnea through a sleep study? No  If yes, do you have and use a CPAP or BPAP machine every night? 0  Do you snore loudly (loud enough to be heard through closed doors)?  1  Do you often feel tired, fatigued, or sleepy during the daytime? 0  Has anyone observed you stop breathing during your sleep? 1  Do you have, or are you being treated for high blood pressure? 1  BMI more than 35 kg/m2? 0  Age over 27 years old? 0  Neck circumference greater than 40 cm/18 inches? 0  Gender: 1  Obstructive Sleep Apnea Score 4   Score 4 or greater  Results sent to PCP

## 2012-09-12 NOTE — Progress Notes (Signed)
Pt has pre existing Diatek in right upper chest-both ports are capped & clamped.

## 2012-09-12 NOTE — Anesthesia Preprocedure Evaluation (Addendum)
Anesthesia Evaluation  Patient identified by MRN, date of birth, ID band Patient awake    Reviewed: Allergy & Precautions, H&P , NPO status , Patient's Chart, lab work & pertinent test results, reviewed documented beta blocker date and time   History of Anesthesia Complications (+) PONV  Airway Mallampati: II  Neck ROM: Full    Dental  (+) Edentulous Upper, Partial Upper and Dental Advisory Given   Pulmonary          Cardiovascular hypertension, Pt. on medications and Pt. on home beta blockers +CHF  Hx CHF when presented in ARF 4-5 years ago   Neuro/Psych    GI/Hepatic   Endo/Other  diabetes, Well Controlled, Type 2, Oral Hypoglycemic Agents  Renal/GU [facility]  Diatek in place.     Musculoskeletal   Abdominal   Peds  Hematology   Anesthesia Other Findings   Reproductive/Obstetrics                         Anesthesia Physical Anesthesia Plan  ASA: III  Anesthesia Plan: General   Post-op Pain Management:    Induction: Intravenous  Airway Management Planned: LMA  Additional Equipment:   Intra-op Plan:   Post-operative Plan: Extubation in OR  Informed Consent: I have reviewed the patients History and Physical, chart, labs and discussed the procedure including the risks, benefits and alternatives for the proposed anesthesia with the patient or authorized representative who has indicated his/her understanding and acceptance.     Plan Discussed with: CRNA and Surgeon  Anesthesia Plan Comments:         Anesthesia Quick Evaluation

## 2012-09-12 NOTE — Op Note (Addendum)
Procedure: Left forearm AV graft, ligation upper arm AV fistula  Preoperative diagnosis: End-stage renal disease  Postoperative diagnosis: Same  Anesthesia: General.  Assistant: Leontine Locket PA-C  Operative findings: 6 mm PTFE graft  Operative details: After obtaining informed consent, the patient was taken to the operating room. The patient was placed in supine position on the operating room table. After adequate sedation, the patient's entire left upper extremity was prepped and draped in usual sterile fashion. Local anesthesia was infiltrated at all areas of incision.  A longitudinal incision was made in the left antecubital area. Incision was carried down through the subcutaneous tissues down to the level of the left deep brachial vein. The vein was approximately 3 mm in diameter.  Next the brachial artery was dissected free in the medial portion incision. The brachial artery was approximately 3 mm in diameter but was quite thickened.  Adjacent deep brachial vein was also approximately 3 mm in diameter. This was dissected free circumferentially to the selected as the outflow for the graft. A longitudinal incision was made over the old fistula near the antecubital crease carried down through the subcutaneous tissues and the fistula dissected free circumferentially.  It was ligated with a 0 silk tie just above the arterial anastomosis.  Next a transverse incision was made in the distal forearm for assistance in tunneling. A subcutaneous tunnel was then created in a loop configuration down the forearm and a 6 mm PTFE graft was brought through this subcutaneous tunnel with the arterial limb on the radial aspect of the forearm. The patient was given 5000 units of intravenous heparin. After approximately 2 minutes of circulation time, Vesseloops were used to control the brachial artery proximally and distally. A longitudinal opening was made in the brachial artery the end of the graft was slightly  beveled and sewn end of graft to side of artery using a running 6-0 Prolene suture. Prior to completion of the anastomosis the artery was forward bled back bled and thoroughly flushed. The anastomosis was secured, vessel loops released, and there was pulsatile flow in the graft immediately. Attention was then turned to the venous limb of the graft. Small bulldog clamps were used to control the outflow vein proximally and distally. A longitudinal opening was made in the deep brachial vein the graft was cut to length and spatulated and sewn end of graft to side of vein using a running 6-0 Prolene suture. Just prior to completion of anastomosis it was forward bled back bled and thoroughly flushed. The anastomosis was secured, Vesseloops were released, and  there was a palpable thrill above the graft immediately. Hemostasis was obtained with direct pressure and the assistance of 50 mg of protamine. Next subcutaneous tissues of both incisions reapproximated using a running 3-0 Vicryl suture. The skin of both incisions was closed with 4-0 Vicryl subcuticular stitch. Dermabond was applied to both incisions.  The patient tolerated the procedure well and there were no complications. Instrument sponge and needle counts were correct at the end of the case. The patient was taken to the recovery room in stable condition. The patient had a palpable radial pulse at the end of the case.  Ruta Hinds, MD [facility] [place] Office: [phone] Pager: [phone]

## 2012-09-12 NOTE — Transfer of Care (Signed)
Immediate Anesthesia Transfer of Care Note  Patient: [name]  Procedure(s) Performed: Procedure(s) (LRB) with comments: INSERTION OF ARTERIOVENOUS (AV) GORE-TEX GRAFT ARM (Left) - KIDNEY TRANSPLANT FAILED  Patient Location: PACU  Anesthesia Type:MAC  Level of Consciousness: awake, alert , oriented and patient cooperative  Airway & Oxygen Therapy: Patient Spontanous Breathing and Patient connected to nasal cannula oxygen  Post-op Assessment: Report given to PACU RN and Post -op Vital signs reviewed and stable  Post vital signs: Reviewed and stable  Complications: No apparent anesthesia complications

## 2012-09-12 NOTE — Anesthesia Postprocedure Evaluation (Signed)
  Anesthesia Post-op Note  Patient: [name]  Procedure(s) Performed: Procedure(s) (LRB) with comments: INSERTION OF ARTERIOVENOUS (AV) GORE-TEX GRAFT ARM (Left) - KIDNEY TRANSPLANT FAILED  Patient Location: PACU  Anesthesia Type:MAC  Level of Consciousness: awake, alert , oriented and patient cooperative  Airway and Oxygen Therapy: Patient Spontanous Breathing and Patient connected to nasal cannula oxygen  Post-op Pain: none  Post-op Assessment: Post-op Vital signs reviewed, PATIENT'S CARDIOVASCULAR STATUS UNSTABLE, RESPIRATORY FUNCTION UNSTABLE, Patent Airway and No signs of Nausea or vomiting  Post-op Vital Signs: Reviewed and stable  Complications: No apparent anesthesia complications

## 2012-09-12 NOTE — H&P (Signed)
History of Present Illness: Patient is a 42 y.o. year old male who presents for placement of a permanent hemodialysis access. He previously had a left arm AV fistula which is now occluded. The patient is right handed  . The patient is currently on hemodialysis. He is currently dialyzing via a catheter Monday Wednesday and Friday at [place]. Other chronic medical problems include diabetes cardiomyopathy, hypotension and anemia. All these are currently stable.  Past Medical History   Diagnosis  Date   .  Non-ischemic cardiomyopathy      EF 20-25% 12/23/2008>>EF 40-45% 03/2009>>EF ??? 7/?/2011   .  ESRD (end stage renal disease) on dialysis      1.Initiated HD via right per cate 12/30/2008 (MWF schedule at [facility]) 2.Left upper extremity A-V fistula by Dr Oneida Alar 12/30/2008 3.Aemia: Received Venofer 12/30/08 Ferritin 179 % sat 12 12/23/08, 4. Secondary Hyperparathyroidism- PTH 188, P 5.4, Ca 9.2 12/2008   .  Diabetes mellitus  12/2008     A1C 8.3 12/23/08, 24hr urine protein 2793 mg/day, SPEP neg, proliferative BDR s/p photocoagulation 08/2009 done at [facility] and f/u by DR Groat   .  Hypertension      resloved after initating HD, now with post HD hypotension   .  Hemodialysis-associated hypotension      coreg d/c'd   .  Anemia    .  CHF (congestive heart failure)     Past Surgical History   Procedure  Date   .  Cataract extraction, bilateral    .  Av fistula placement      left   .  Photocoagulation      for diabetic retinopathy 08/1999   .  Insertion of dialysis catheter  07/14/2012     Procedure: INSERTION OF DIALYSIS CATHETER; Surgeon: Mal Misty, MD; Location: [place]; Service: Vascular; Laterality: Right; Ultrasound Guided Insertion of Internal Jugular Dialysis Catheter   Social History  History   Substance Use Topics   .  Smoking status:  Never Smoker   .  Smokeless tobacco:  Never Used   .  Alcohol Use:  No   Family History  Family History   Problem  Relation  Age of  Onset   .  Hypertension  Mother    .  Diabetes  Father    .  Kidney disease  Father    .  Other  Father       amputation    .  Coronary artery disease  Neg Hx       no hx of CHF, sudden cardiac death, or CAD    .  Hypertension  Brother    .  Other  Brother       bleeding problems   Allergies  Allergies   Allergen  Reactions   .  Tape  Other (See Comments)     Only tolerated paper tape    Current Outpatient Prescriptions   Medication  Sig  Dispense  Refill   .  aspirin EC 81 MG tablet  Take 81 mg by mouth daily.     .  B Complex-C-Folic Acid (RENA-VITE PO)  Take 1 tablet by mouth daily.     .  Blood Glucose Monitoring Suppl (FREESTYLE FREEDOM LITE) W/DEVICE KIT  Check blood sugar once daily as instructed dx code 250.00  1 each  0   .  calcium carbonate (OS-CAL - DOSED IN MG OF ELEMENTAL CALCIUM) 1250 MG tablet  Take 1 tablet by mouth  3 (three) times daily with meals.     .  carvedilol (COREG) 6.25 MG tablet  Take 6.25 mg by mouth See admin instructions. Takes twice daily except on dialysis days (MWF) only takes evening dose (no morning dose)     .  clotrimazole (LOTRIMIN) 1 % cream  Apply topically 2 (two) times daily.  60 g  1   .  clotrimazole (LOTRIMIN) 1 % cream  Apply 1 application topically 2 (two) times daily. Apply to both feet     .  docusate sodium (DULCOLAX) 100 MG capsule  Take 100 mg by mouth daily as needed. For constipation     .  glucose blood (FREESTYLE LITE) test strip  Check blood sugar once daily as instructed dx code 250.00  32 each  5   .  Lancets 30G MISC  Use to check blood sugar once daily as instructed dx code 250.00  100 each  5   .  promethazine (PHENERGAN) 25 MG tablet  Take 25 mg by mouth every 8 (eight) hours as needed. For nausea/vomiting     .  glipiZIDE (GLUCOTROL XL) 10 MG 24 hr tablet  Take 1 tablet (10 mg total) by mouth daily.  30 tablet  11   .  guaifenesin (ROBITUSSIN) 100 MG/5ML syrup  Take 10 mLs (200 mg total) by mouth 3 (three) times  daily as needed for cough.  240 mL  1   .  ketoconazole (NIZORAL) 2 % shampoo  Apply topically 2 (two) times a week. Wash hair and beard. Leave shampoo on skin for 5 mins before washing off.  120 mL  1   .  triamcinolone (KENALOG) 0.025 % ointment  Apply topically 2 (two) times daily. Apply to nose and beard, in areas of dry skin  80 g  1     Physical Examination   Filed Vitals:   09/12/12 0617 09/12/12 0647  BP: 145/88   Pulse: 73   Temp: 98.7 F (37.1 C)   TempSrc: Oral   Resp: 18   Height:  5\' 5"  (1.651 m)  SpO2: 98%   General: Alert and oriented, no acute distress  HEENT: Normal  Neck: No bruit or JVD  Pulmonary: Clear to auscultation bilaterally  Cardiac: Regular Rate and Rhythm without murmur  Skin: No rash  Extremity Pulses: 2+ radial, brachial pulses bilaterally, occluded aneurysmal left upper arm AV fistula  Musculoskeletal: No deformity or edema  Neurologic: Upper and lower extremity motor 5/5 and symmetric  DATA: Bilateral upper extremity vein mapping was performed today. The basilic vein in the left arm is fairly small and probably not usable for fistula. The upper arm cephalic vein on the right side is of reasonable quality between 3 and 5 mm in diameter.   ASSESSMENT: Needs hemodialysis access. I discussed with the patient today the possibility of switching to his right arm but he would prefer to keep access in his left arm at this point.   PLAN: Left arm AV graft scheduled for 08/22/2012. Risks benefits possible complications and procedure details were discussed with the patient today. These include but are not limited to bleeding infection graft thrombosis.   Ruta Hinds, MD  [facility] [place]  Office: [phone]  Pager: [phone]

## 2012-09-13 ENCOUNTER — Encounter (HOSPITAL_COMMUNITY): Payer: Self-pay | Admitting: Vascular Surgery

## 2012-09-15 DIAGNOSIS — N186 End stage renal disease: Secondary | ICD-10-CM | POA: Diagnosis not present

## 2012-09-18 DIAGNOSIS — N2581 Secondary hyperparathyroidism of renal origin: Secondary | ICD-10-CM | POA: Diagnosis not present

## 2012-09-18 DIAGNOSIS — D631 Anemia in chronic kidney disease: Secondary | ICD-10-CM | POA: Diagnosis not present

## 2012-09-18 DIAGNOSIS — N186 End stage renal disease: Secondary | ICD-10-CM | POA: Diagnosis not present

## 2012-10-09 ENCOUNTER — Other Ambulatory Visit (HOSPITAL_COMMUNITY): Payer: Self-pay | Admitting: Nephrology

## 2012-10-09 DIAGNOSIS — N186 End stage renal disease: Secondary | ICD-10-CM

## 2012-10-12 ENCOUNTER — Ambulatory Visit (HOSPITAL_COMMUNITY): Admission: RE | Admit: 2012-10-12 | Payer: Medicare Other | Source: Ambulatory Visit

## 2012-10-13 DIAGNOSIS — N186 End stage renal disease: Secondary | ICD-10-CM | POA: Diagnosis not present

## 2012-10-16 DIAGNOSIS — N2581 Secondary hyperparathyroidism of renal origin: Secondary | ICD-10-CM | POA: Diagnosis not present

## 2012-10-16 DIAGNOSIS — D509 Iron deficiency anemia, unspecified: Secondary | ICD-10-CM | POA: Diagnosis not present

## 2012-10-16 DIAGNOSIS — N186 End stage renal disease: Secondary | ICD-10-CM | POA: Diagnosis not present

## 2012-10-16 DIAGNOSIS — D631 Anemia in chronic kidney disease: Secondary | ICD-10-CM | POA: Diagnosis not present

## 2012-10-17 ENCOUNTER — Other Ambulatory Visit: Payer: Self-pay

## 2012-10-19 ENCOUNTER — Ambulatory Visit (HOSPITAL_COMMUNITY)
Admission: RE | Admit: 2012-10-19 | Discharge: 2012-10-19 | Disposition: A | Discharge status: Home / Self Care | Payer: Medicare Other | Source: Ambulatory Visit | Attending: Surgery | Admitting: Surgery

## 2012-10-19 DIAGNOSIS — Z452 Encounter for adjustment and management of vascular access device: Secondary | ICD-10-CM | POA: Insufficient documentation

## 2012-10-19 DIAGNOSIS — N186 End stage renal disease: Secondary | ICD-10-CM

## 2012-10-19 NOTE — Progress Notes (Signed)
[facility] SHORT STAY H&P  CC:  Catheter removal   HPI:  This is a 42 y.o. male here for diatek catheter removal that he had placed 07/14/12 by Dr. Kellie Simmering.  ON September 12, 2012, he had a ligation of the LUA AVF and placement of left FA AVG.  This has been used twice without difficulty.  The only blood thinner that he is on at this time is aspirin.    Past Medical History  Diagnosis Date  . Non-ischemic cardiomyopathy     EF 20-25% 12/23/2008>>EF 40-45% 03/2009>>EF ??? 7/?/2011  . ESRD (end stage renal disease) on dialysis     1.Initiated HD via right per cate 12/30/2008 (MWF schedule at [facility]) 2.Left upper extremity A-V fistula by Dr Oneida Alar 12/30/2008 3.Aemia: Received Venofer 12/30/08 Ferritin 179 % sat 12 12/23/08, 4. Secondary Hyperparathyroidism- PTH 188, P 5.4, Ca 9.2 12/2008  . Diabetes mellitus 12/2008    A1C 8.3 12/23/08, 24hr urine protein 2793 mg/day, SPEP neg, proliferative BDR s/p photocoagulation 08/2009 done at [facility] and f/u by DR Groat  . Hypertension     resloved after initating HD, now with post HD hypotension  . Hemodialysis-associated hypotension     coreg d/c'd  . Anemia   . CHF (congestive heart failure)   . Complication of anesthesia   . PONV (postoperative nausea and vomiting)   . History of blood transfusion   . Psoriasis     scalp    FH:  Non-Contributory  History   Social History  . Marital Status: Married    Spouse Name: N/A    Number of Children: N/A  . Years of Education: N/A   Occupational History  . Not on file.   Social History Main Topics  . Smoking status: Never Smoker   . Smokeless tobacco: Never Used  . Alcohol Use: No  . Drug Use: No  . Sexually Active: Not on file   Other Topics Concern  . Not on file   Social History Narrative   Has two children. No risky sexual behavior.    Allergies  Allergen Reactions  . Tape Other (See Comments)    Only tolerated paper tape    Current Outpatient  Prescriptions  Medication Sig Dispense Refill  . aspirin EC 81 MG tablet Take 81 mg by mouth daily.      . B Complex-C-Folic Acid (RENA-VITE PO) Take 1 tablet by mouth daily.      . Blood Glucose Monitoring Suppl (FREESTYLE FREEDOM LITE) W/DEVICE KIT Check blood sugar once daily as instructed dx code 250.00  1 each  0  . calcium carbonate (OS-CAL - DOSED IN MG OF ELEMENTAL CALCIUM) 1250 MG tablet Take 1 tablet by mouth 3 (three) times daily with meals.      . carvedilol (COREG CR) 10 MG 24 hr capsule Take 10 mg by mouth daily.      . clotrimazole (LOTRIMIN) 1 % cream APPLY TOPICALLY TWICE A DAY  60 g  0  . docusate sodium (DULCOLAX) 100 MG capsule Take 100 mg by mouth daily as needed. For constipation      . fluconazole (DIFLUCAN) 50 MG tablet Take 50 mg by mouth daily.      Marland Kitchen glipiZIDE (GLUCOTROL XL) 10 MG 24 hr tablet Take 1 tablet (10 mg total) by mouth daily.  30 tablet  11  . glucose blood (FREESTYLE LITE) test strip Check blood sugar once daily as instructed dx code 250.00  32 each  5  . guaifenesin (ROBITUSSIN) 100 MG/5ML syrup Take 10 mLs (200 mg total) by mouth 3 (three) times daily as needed for cough.  240 mL  1  . HYDROcodone-acetaminophen (NORCO/VICODIN) 5-325 MG per tablet Take 1 tablet by mouth every 6 (six) hours as needed.  30 tablet  0  . ketoconazole (NIZORAL) 2 % shampoo Apply topically 2 (two) times a week. Wash hair and beard.  Leave shampoo on skin for 5 mins before washing off.  120 mL  1  . Lancets 30G MISC Use to check blood sugar once daily as instructed dx code 250.00  100 each  5  . omeprazole (PRILOSEC) 20 MG capsule Take 20 mg by mouth daily.      . promethazine (PHENERGAN) 25 MG tablet Take 25 mg by mouth every 8 (eight) hours as needed. For nausea/vomiting      . sulfamethoxazole-trimethoprim (BACTRIM,SEPTRA) 400-80 MG per tablet Take 1 tablet by mouth 2 (two) times daily. Take one tablet on Mon., Wed., and Friday.      . triamcinolone (KENALOG) 0.025 % ointment  Apply topically 2 (two) times daily. Apply to nose and beard, in areas of dry skin  80 g  1  . valGANciclovir (VALCYTE) 450 MG tablet Take 450 mg by mouth daily. Take one tablet on Mon., Wed., and Friday.       No current facility-administered medications for this encounter.    ROS:  See HPI  PHYSICAL EXAM  Filed Vitals:   10/19/12 1257  BP: 128/80  Pulse: 98  Temp: 97.8 F (36.6 C)  Resp: 18    Gen:  Well developed well nourished HEENT:  normocephalic Neck:  Right IJ diatek catheter in place Heart:  regular Lungs:  Non-labored Extremities:  + thrill and bruit in left FA AVG; + palpable bilateral radial pulses; BLE without edema Skin:  No obvious rashes Neuro:  In tact  Lab/X-ray:  Impression: This is a 42 y.o. male here for diatek catheter removal  Plan:  Removal of right diatek catheter  Leontine Locket, PA-C [facility] [phone] 10/19/2012 1:17 PM

## 2012-10-19 NOTE — Progress Notes (Signed)
[facility] Catheter Removal Procedure Note  Diagnosis: ESRD with Functioning AVF/AVGG  Plan:  Remove right diatek catheter  Consent signed:  yes Time out completed:  yes Coumadin:  no PT/INR (if applicable):   Other labs:   Procedure: 1.  Sterile prepping and draping over catheter area 2. 5 ml 2% lidocaine plain instilled at removal site. 3.  right catheter removed in its entirety with cuff in tact. 4.  Complications: none  5. Tip of catheter sent for culture:  no   Patient tolerated procedure well:  yes Pressure held, no bleeding noted, dressing applied Instructions given to the pt regarding wound care and bleeding.  Other:  ROCZNIAK,REGINA J 10/19/2012 1:50 PM

## 2012-10-24 ENCOUNTER — Ambulatory Visit (HOSPITAL_COMMUNITY): Admission: RE | Admit: 2012-10-24 | Payer: Medicare Other | Source: Ambulatory Visit

## 2012-11-01 ENCOUNTER — Ambulatory Visit (HOSPITAL_COMMUNITY)
Admission: RE | Admit: 2012-11-01 | Discharge: 2012-11-01 | Disposition: A | Discharge status: Home / Self Care | Payer: Medicare Other | Source: Ambulatory Visit | Attending: Nephrology | Admitting: Nephrology

## 2012-11-01 ENCOUNTER — Other Ambulatory Visit (HOSPITAL_COMMUNITY): Payer: Self-pay | Admitting: Nephrology

## 2012-11-01 DIAGNOSIS — I509 Heart failure, unspecified: Secondary | ICD-10-CM | POA: Insufficient documentation

## 2012-11-01 DIAGNOSIS — I82609 Acute embolism and thrombosis of unspecified veins of unspecified upper extremity: Secondary | ICD-10-CM | POA: Diagnosis not present

## 2012-11-01 DIAGNOSIS — E119 Type 2 diabetes mellitus without complications: Secondary | ICD-10-CM | POA: Diagnosis not present

## 2012-11-01 DIAGNOSIS — I871 Compression of vein: Secondary | ICD-10-CM | POA: Insufficient documentation

## 2012-11-01 DIAGNOSIS — N186 End stage renal disease: Secondary | ICD-10-CM | POA: Insufficient documentation

## 2012-11-01 DIAGNOSIS — I428 Other cardiomyopathies: Secondary | ICD-10-CM | POA: Insufficient documentation

## 2012-11-01 DIAGNOSIS — Z992 Dependence on renal dialysis: Secondary | ICD-10-CM | POA: Insufficient documentation

## 2012-11-01 DIAGNOSIS — T82898A Other specified complication of vascular prosthetic devices, implants and grafts, initial encounter: Secondary | ICD-10-CM | POA: Diagnosis not present

## 2012-11-01 DIAGNOSIS — I120 Hypertensive chronic kidney disease with stage 5 chronic kidney disease or end stage renal disease: Secondary | ICD-10-CM | POA: Diagnosis not present

## 2012-11-01 DIAGNOSIS — Y832 Surgical operation with anastomosis, bypass or graft as the cause of abnormal reaction of the patient, or of later complication, without mention of misadventure at the time of the procedure: Secondary | ICD-10-CM | POA: Insufficient documentation

## 2012-11-01 LAB — GLUCOSE, CAPILLARY: Glucose-Capillary: 85 mg/dL (ref 70–99)

## 2012-11-01 MED ORDER — HEPARIN SODIUM (PORCINE) 1000 UNIT/ML IJ SOLN
INTRAMUSCULAR | Status: AC
  Administered 2012-11-01: 3000 [IU]
  Filled 2012-11-01: qty 1

## 2012-11-01 MED ORDER — ALTEPLASE 2 MG IJ SOLR
4.0000 mg | Freq: Once | INTRAMUSCULAR | Status: AC
  Administered 2012-11-01: 4 mg
  Filled 2012-11-01: qty 4

## 2012-11-01 MED ORDER — IOHEXOL 300 MG/ML  SOLN
100.0000 mL | Freq: Once | INTRAMUSCULAR | Status: AC | PRN
  Administered 2012-11-01: 50 mL via INTRAVENOUS

## 2012-11-01 NOTE — H&P (Signed)
Agree with PA note.    Signed,  Debany Vantol K. Adalyn Pennock, MD [facility]  

## 2012-11-01 NOTE — ED Notes (Signed)
Pt will not be receiving moderate sedation today.  He was unable to find a ride and drove himself here

## 2012-11-01 NOTE — Procedures (Signed)
Interventional Radiology Procedure Note  Procedure: Successful declot and PTA of venous anastomotic and long segment draining vein to 7 mm. Complications: None Recommendations: - May resume dialysis  Signed,  Criselda Peaches, MD [facility] Inter[facility]iologist [facility]

## 2012-11-01 NOTE — H&P (Signed)
Chief Complaint: Clotted (L)UE AV graft HPI: [name] is an 42 y.o. male with ESRD. Had a (L)UE AVG created back in January and has been working well. Last had HD 2 days ago, worked fine. Went for HD today, graft was found to be clotted. Now scheduled for declot procedure with IR. Feels well, has had prior PTA/shuntagram and is familiar with procedure. PMHx and meds reviewed.  Past Medical History:  Past Medical History  Diagnosis Date  . Non-ischemic cardiomyopathy     EF 20-25% 12/23/2008>>EF 40-45% 03/2009>>EF ??? 7/?/2011  . ESRD (end stage renal disease) on dialysis     1.Initiated HD via right per cate 12/30/2008 (MWF schedule at [facility]) 2.Left upper extremity A-V fistula by Dr Oneida Alar 12/30/2008 3.Aemia: Received Venofer 12/30/08 Ferritin 179 % sat 12 12/23/08, 4. Secondary Hyperparathyroidism- PTH 188, P 5.4, Ca 9.2 12/2008  . Diabetes mellitus 12/2008    A1C 8.3 12/23/08, 24hr urine protein 2793 mg/day, SPEP neg, proliferative BDR s/p photocoagulation 08/2009 done at [facility] and f/u by DR Groat  . Hypertension     resloved after initating HD, now with post HD hypotension  . Hemodialysis-associated hypotension     coreg d/c'd  . Anemia   . CHF (congestive heart failure)   . Complication of anesthesia   . PONV (postoperative nausea and vomiting)   . History of blood transfusion   . Psoriasis     scalp    Past Surgical History:  Past Surgical History  Procedure Laterality Date  . Cataract extraction, bilateral    . Av fistula placement      left  . Photocoagulation      for diabetic retinopathy 08/1999  . Insertion of dialysis catheter  07/14/2012    Procedure: INSERTION OF DIALYSIS CATHETER;  Surgeon: Mal Misty, MD;  Location: [place];  Service: Vascular;  Laterality: Right;  Ultrasound Guided Insertion of Internal Jugular Dialysis Catheter  . Eye surgery    . Kidney transplant  2013    did not work  . Nephrectomy transplanted organ  08/11/12  . Av fistula  placement  09/12/2012    Procedure: INSERTION OF ARTERIOVENOUS (AV) GORE-TEX GRAFT ARM;  Surgeon: Elam Dutch, MD;  Location: [facility] OR;  Service: Vascular;  Laterality: Left;  KIDNEY TRANSPLANT FAILED    Family History:  Family History  Problem Relation Age of Onset  . Hypertension Mother   . Diabetes Father   . Kidney disease Father   . Other Father     amputation  . Coronary artery disease Neg Hx     no hx of CHF, sudden cardiac death, or CAD  . Hypertension Brother   . Other Brother     bleeding problems    Social History:  reports that he has never smoked. He has never used smokeless tobacco. He reports that he does not drink alcohol or use illicit drugs.  Allergies:  Allergies  Allergen Reactions  . Tape Other (See Comments)    Only tolerated paper tape    Medications: aspirin EC 81 MG tablet (Taking) Sig - Route: Take 81 mg by mouth daily. - Oral Class: Historical Med B Complex-C-Folic Acid (RENA-VITE PO) (Taking) Sig - Route: Take 1 tablet by mouth daily. - Oral Class: Historical Med Number of times this order has been changed since signing: 1 Order [address] Blood Glucose Monitoring Suppl (FREESTYLE FREEDOM LITE) W/DEVICE KIT (Taking) 1 each 0 06/06/2012 Sig: Check blood sugar once daily as instructed  dx code 250.00 Class: Sample Number of times this order has been changed since signing: 1 Order [address] calcium carbonate (OS-CAL - DOSED IN MG OF ELEMENTAL CALCIUM) 1250 MG tablet (Taking) Sig - Route: Take 1 tablet by mouth 3 (three) times daily with meals. - Oral Class: Historical Med carvedilol (COREG CR) 10 MG 24 hr capsule (Taking) Sig - Route: Take 10 mg by mouth daily. - Oral Class: Historical Med Number of times this order has been changed since signing: 1 Order [address] clotrimazole (LOTRIMIN) 1 % cream (Taking) 60 g 0 08/23/2012 Sig: APPLY TOPICALLY TWICE A DAY Number of times this order has been changed since signing: 2 Order [address] docusate sodium  (DULCOLAX) 100 MG capsule (Taking) Sig - Route: Take 100 mg by mouth daily as needed. For constipation - Oral Class: Historical Med Number of times this order has been changed since signing: 2 Order [address] glipiZIDE (GLUCOTROL XL) 10 MG 24 hr tablet (Taking) 30 tablet 11 08/03/2012 Sig - Route: Take 1 tablet (10 mg total) by mouth daily. - Oral Number of times this order has been changed since signing: 2 Order [address] glucose blood (FREESTYLE LITE) test strip (Taking) 32 each 5 06/19/2012 Sig: Check blood sugar once daily as instructed dx code 250.00 Number of times this order has been changed since signing: 1 Order [address] ketoconazole (NIZORAL) 2 % shampoo (Taking) 120 mL 1 08/03/2012 Sig - Route: Apply topically 2 (two) times a week. Wash hair and beard. Leave shampoo on skin for 5 mins before washing off. - Topical Number of times this order has been changed since signing: 2 Order [address] Lancets 30G MISC (Taking) 100 each 5 06/06/2012 Sig: Use to check blood sugar once daily as instructed dx code 250.00 Class: Print Number of times this order has been changed since signing: 1 Order [address] omeprazole (PRILOSEC) 20 MG capsule (Taking) Sig - Route: Take 20 mg by mouth daily. - Oral Class: Historical Med Number of times this order has been changed since signing: 1 Order [address] promethazine (PHENERGAN) 25 MG tablet (Taking) Sig - Route: Take 25 mg by mouth every 8 (eight) hours as needed. For nausea/vomiting - Oral Class: Historical Med triamcinolone (KENALOG) 0.025 % ointment (Taking) 80 g 1 08/03/2012 Sig - Route: Apply topically 2 (two) times daily. Apply to nose and beard, in areas of dry skin - Topic  Please HPI for pertinent positives, otherwise complete 10 system ROS negative.  Physical Exam: Blood pressure 138/88, pulse 70, temperature 98.8 F (37.1 C), temperature source Oral. There is no weight on file to calculate BMI.   General Appearance:  Alert, cooperative, no  distress, appears stated age  Head:  Normocephalic, without obvious abnormality, atraumatic  ENT: Unremarkable  Neck: Supple, symmetrical, trachea midline, no adenopathy, thyroid: not enlarged, symmetric, no tenderness/mass/nodules  Lungs:   Clear to auscultation bilaterally, no w/r/r, respirations unlabored without use of accessory muscles.  Chest Wall:  No tenderness or deformity  Heart:  Regular rate and rhythm, S1, S2 normal, no murmur, rub or gallop. Carotids 2+ without bruit.  Extremities: (L)UE forearm loop graft without pulse/thrill. Hand warm, good radial pulse  Neurologic: Normal affect, no gross deficits.   No results found for this or any previous visit (from the past 48 hour(s)). No results found.  Assessment/Plan Thrombosed (L)UE loop AVG For thrombolysis, thrombectomy, poss angioplasty, poss HD catheter. Discussed procedure, risks, complications. Cannot use sedation as pt drove himself. No labs.  Consent signed in chart  Ascencion Dike PA-C 11/01/2012, 1:19 PM

## 2012-11-02 ENCOUNTER — Telehealth (HOSPITAL_COMMUNITY): Payer: Self-pay | Admitting: *Deleted

## 2012-11-13 DIAGNOSIS — N186 End stage renal disease: Secondary | ICD-10-CM | POA: Diagnosis not present

## 2012-11-15 DIAGNOSIS — D509 Iron deficiency anemia, unspecified: Secondary | ICD-10-CM | POA: Diagnosis not present

## 2012-11-15 DIAGNOSIS — N186 End stage renal disease: Secondary | ICD-10-CM | POA: Diagnosis not present

## 2012-11-15 DIAGNOSIS — N2581 Secondary hyperparathyroidism of renal origin: Secondary | ICD-10-CM | POA: Diagnosis not present

## 2012-11-22 ENCOUNTER — Ambulatory Visit (INDEPENDENT_AMBULATORY_CARE_PROVIDER_SITE_OTHER): Payer: Medicare Other | Admitting: Internal Medicine

## 2012-11-22 ENCOUNTER — Encounter: Payer: Self-pay | Admitting: Internal Medicine

## 2012-11-22 VITALS — BP 115/71 | HR 70 | Temp 98.1°F | Ht 65.0 in | Wt 160.5 lb

## 2012-11-22 DIAGNOSIS — N186 End stage renal disease: Secondary | ICD-10-CM

## 2012-11-22 DIAGNOSIS — E1165 Type 2 diabetes mellitus with hyperglycemia: Secondary | ICD-10-CM

## 2012-11-22 DIAGNOSIS — I10 Essential (primary) hypertension: Secondary | ICD-10-CM

## 2012-11-22 DIAGNOSIS — IMO0002 Reserved for concepts with insufficient information to code with codable children: Secondary | ICD-10-CM

## 2012-11-22 DIAGNOSIS — E118 Type 2 diabetes mellitus with unspecified complications: Secondary | ICD-10-CM

## 2012-11-22 LAB — POCT GLYCOSYLATED HEMOGLOBIN (HGB A1C): Hemoglobin A1C: 7.5

## 2012-11-22 MED ORDER — SITAGLIPTIN PHOSPHATE 25 MG PO TABS: 25.0000 mg | ORAL_TABLET | Freq: Every day | ORAL | Status: DC

## 2012-11-22 NOTE — Assessment & Plan Note (Signed)
Lab Results  Component Value Date   HGBA1C 7.5 11/22/2012   HGBA1C 9.4 06/06/2012   HGBA1C 7.5 10/20/2010     Assessment: Diabetes control: good control (HgbA1C at goal) Progress toward A1C goal:  improved Comments: The patient's A1C has decreased from 9.4 to 7.5!  Though, he has ESRD, so the validity of this is somewhat unclear.  Regardless, we'll try to get his A1C < 7, by adding Januvia  Plan: Medications:  Continue glipizide, add Januvia 25 mg daily Home glucose monitoring: Frequency: once a day Timing: before breakfast Instruction/counseling given: reminded to bring blood glucose meter & log to each visit Educational resources provided: brochure Self management tools provided:   Other plans: Recheck A1C in 3 months

## 2012-11-22 NOTE — Progress Notes (Signed)
HPI The patient is a 42 y.o. male with a history of DM2, ESRD, HTN, presenting for a routine follow-up visit.  The patient has a history of DM.  His CBG in clinic today is 397, after eating a bag of skittles.  He notes no polyuria, polydipsia, or blurry vision.  The patient did not bring his glucometer, but reports 95-125 fasting AM CBG's, with highs in the 200's.  The patient is currently on glipizide, the dose of which was increased at his last visit.  The patient's A1C was found to have decreased from 0.4 at his last visit to 7.5 today, through diet, exercise, and increased glipizide dose.  The patient recently had an AV fistula placed for HD.  He is currently undergoing MWF HD.  The patient has a history of HTN.  BP is well-controlled today.  ROS: General: no fevers, chills, changes in weight, changes in appetite Skin: no rash HEENT: no blurry vision, hearing changes, sore throat Pulm: no dyspnea, coughing, wheezing CV: no chest pain, palpitations, shortness of breath Abd: no abdominal pain, nausea/vomiting, diarrhea/constipation Ext: no arthralgias, myalgias Neuro: no weakness, numbness, or tingling  Filed Vitals:   11/22/12 1532  BP: 115/71  Pulse: 70  Temp: 98.1 F (36.7 C)    PEX General: alert, cooperative, and in no apparent distress HEENT: pupils equal round and reactive to light, vision grossly intact, oropharynx clear and non-erythematous  Neck: supple, no lymphadenopathy Lungs: clear to ascultation bilaterally, normal work of respiration, no wheezes, rales, ronchi Heart: regular rate and rhythm, no murmurs, gallops, or rubs Abdomen: soft, non-tender, non-distended, normal bowel sounds.  Well-healed surgical scar on right lower quadrant of abdomen Extremities: no cyanosis, clubbing, or edema Neurologic: alert & oriented X3, cranial nerves II-XII intact, strength grossly intact, sensation intact to light touch  Current Outpatient Prescriptions on File Prior to Visit   Medication Sig Dispense Refill  . aspirin EC 81 MG tablet Take 81 mg by mouth daily.      . B Complex-C-Folic Acid (RENA-VITE PO) Take 1 tablet by mouth daily.      . Blood Glucose Monitoring Suppl (FREESTYLE FREEDOM LITE) W/DEVICE KIT Check blood sugar once daily as instructed dx code 250.00  1 each  0  . calcium carbonate (OS-CAL - DOSED IN MG OF ELEMENTAL CALCIUM) 1250 MG tablet Take 1 tablet by mouth 3 (three) times daily with meals.      . carvedilol (COREG CR) 10 MG 24 hr capsule Take 10 mg by mouth daily.      . clotrimazole (LOTRIMIN) 1 % cream APPLY TOPICALLY TWICE A DAY  60 g  0  . docusate sodium (DULCOLAX) 100 MG capsule Take 100 mg by mouth daily as needed. For constipation      . glipiZIDE (GLUCOTROL XL) 10 MG 24 hr tablet Take 1 tablet (10 mg total) by mouth daily.  30 tablet  11  . glucose blood (FREESTYLE LITE) test strip Check blood sugar once daily as instructed dx code 250.00  32 each  5  . HYDROcodone-acetaminophen (NORCO/VICODIN) 5-325 MG per tablet Take 1 tablet by mouth every 6 (six) hours as needed.  30 tablet  0  . ketoconazole (NIZORAL) 2 % shampoo Apply topically 2 (two) times a week. Wash hair and beard.  Leave shampoo on skin for 5 mins before washing off.  120 mL  1  . Lancets 30G MISC Use to check blood sugar once daily as instructed dx code 250.00  100 each  5  . omeprazole (PRILOSEC) 20 MG capsule Take 20 mg by mouth daily.      . promethazine (PHENERGAN) 25 MG tablet Take 25 mg by mouth every 8 (eight) hours as needed. For nausea/vomiting      . triamcinolone (KENALOG) 0.025 % ointment Apply topically 2 (two) times daily. Apply to nose and beard, in areas of dry skin  80 g  1   No current facility-administered medications on file prior to visit.    Assessment/Plan

## 2012-11-22 NOTE — Assessment & Plan Note (Signed)
Patient continues to undergo HD, while waiting for kidney transplant.  Recent AV graft procedure performed.

## 2012-11-22 NOTE — Patient Instructions (Addendum)
General Instructions: For your diabetes, your A1C has improved significantly to 7.5, good job!  We need to get your A1C under 7 to prevent damage to your eyes, nerves, and (after kidney transplant) your kidney.  To do this, we are adding Januvia.  Take 1 tablet, once per day.  At your next visit, please bring your glucometer, and we can download the data from it.  Please return for a follow-up visit in 3 months.  Treatment Goals:  Goals (1 Years of Data) as of 11/22/12   None      Progress Toward Treatment Goals:  Treatment Goal 11/22/2012  Hemoglobin A1C improved  Blood pressure at goal    Self Care Goals & Plans:  Self Care Goal 11/22/2012  Manage my medications take my medicines as prescribed; refill my medications on time  Monitor my health keep track of my blood glucose; bring my glucose meter and log to each visit  Eat healthy foods drink diet soda or water instead of juice or soda  Be physically active take a walk every day    Home Blood Glucose Monitoring 11/22/2012  Check my blood sugar once a day  When to check my blood sugar before breakfast     Care Management & Community Referrals:  Referral 11/22/2012  Referrals made for care management support none needed

## 2012-11-22 NOTE — Assessment & Plan Note (Signed)
BP Readings from Last 3 Encounters:  11/22/12 115/71  11/01/12 147/81  10/19/12 129/80    Lab Results  Component Value Date   NA 142 09/12/2012   K 4.5 09/12/2012   CREATININE 11.10* 07/11/2012    Assessment: Blood pressure control: controlled Progress toward BP goal:  at goal Comments: BP well-controlled  Plan: Medications:  continue current medications Educational resources provided:   Self management tools provided:   Other plans: Continue to monitor

## 2012-12-06 DIAGNOSIS — E119 Type 2 diabetes mellitus without complications: Secondary | ICD-10-CM | POA: Diagnosis not present

## 2012-12-13 DIAGNOSIS — N186 End stage renal disease: Secondary | ICD-10-CM | POA: Diagnosis not present

## 2012-12-15 DIAGNOSIS — D509 Iron deficiency anemia, unspecified: Secondary | ICD-10-CM | POA: Diagnosis not present

## 2012-12-15 DIAGNOSIS — D631 Anemia in chronic kidney disease: Secondary | ICD-10-CM | POA: Diagnosis not present

## 2012-12-15 DIAGNOSIS — N186 End stage renal disease: Secondary | ICD-10-CM | POA: Diagnosis not present

## 2012-12-15 DIAGNOSIS — N2581 Secondary hyperparathyroidism of renal origin: Secondary | ICD-10-CM | POA: Diagnosis not present

## 2012-12-19 ENCOUNTER — Other Ambulatory Visit: Payer: Self-pay | Admitting: Internal Medicine

## 2012-12-22 ENCOUNTER — Other Ambulatory Visit (HOSPITAL_COMMUNITY): Payer: Self-pay | Admitting: Nephrology

## 2012-12-22 ENCOUNTER — Ambulatory Visit (HOSPITAL_COMMUNITY)
Admission: RE | Admit: 2012-12-22 | Discharge: 2012-12-22 | Disposition: A | Discharge status: Home / Self Care | Payer: Medicare Other | Source: Ambulatory Visit | Attending: Nephrology | Admitting: Nephrology

## 2012-12-22 DIAGNOSIS — Y832 Surgical operation with anastomosis, bypass or graft as the cause of abnormal reaction of the patient, or of later complication, without mention of misadventure at the time of the procedure: Secondary | ICD-10-CM | POA: Insufficient documentation

## 2012-12-22 DIAGNOSIS — N186 End stage renal disease: Secondary | ICD-10-CM

## 2012-12-22 DIAGNOSIS — Z79899 Other long term (current) drug therapy: Secondary | ICD-10-CM | POA: Insufficient documentation

## 2012-12-22 DIAGNOSIS — D649 Anemia, unspecified: Secondary | ICD-10-CM | POA: Insufficient documentation

## 2012-12-22 DIAGNOSIS — I82609 Acute embolism and thrombosis of unspecified veins of unspecified upper extremity: Secondary | ICD-10-CM | POA: Diagnosis not present

## 2012-12-22 DIAGNOSIS — E119 Type 2 diabetes mellitus without complications: Secondary | ICD-10-CM | POA: Diagnosis not present

## 2012-12-22 DIAGNOSIS — L408 Other psoriasis: Secondary | ICD-10-CM | POA: Insufficient documentation

## 2012-12-22 DIAGNOSIS — I871 Compression of vein: Secondary | ICD-10-CM | POA: Insufficient documentation

## 2012-12-22 DIAGNOSIS — I428 Other cardiomyopathies: Secondary | ICD-10-CM | POA: Insufficient documentation

## 2012-12-22 DIAGNOSIS — I509 Heart failure, unspecified: Secondary | ICD-10-CM | POA: Diagnosis not present

## 2012-12-22 DIAGNOSIS — I953 Hypotension of hemodialysis: Secondary | ICD-10-CM | POA: Insufficient documentation

## 2012-12-22 DIAGNOSIS — Z7982 Long term (current) use of aspirin: Secondary | ICD-10-CM | POA: Diagnosis not present

## 2012-12-22 DIAGNOSIS — T82898A Other specified complication of vascular prosthetic devices, implants and grafts, initial encounter: Secondary | ICD-10-CM | POA: Insufficient documentation

## 2012-12-22 DIAGNOSIS — Z9109 Other allergy status, other than to drugs and biological substances: Secondary | ICD-10-CM | POA: Insufficient documentation

## 2012-12-22 LAB — POTASSIUM: Potassium: 5.7 mEq/L — ABNORMAL HIGH (ref 3.5–5.1)

## 2012-12-22 LAB — GLUCOSE, CAPILLARY: Glucose-Capillary: 127 mg/dL — ABNORMAL HIGH (ref 70–99)

## 2012-12-22 MED ORDER — HEPARIN SODIUM (PORCINE) 1000 UNIT/ML IJ SOLN
INTRAMUSCULAR | Status: AC
  Filled 2012-12-22: qty 1

## 2012-12-22 MED ORDER — HEPARIN SODIUM (PORCINE) 1000 UNIT/ML IJ SOLN
INTRAMUSCULAR | Status: DC | PRN
  Administered 2012-12-22: 3000 [IU] via INTRAVENOUS

## 2012-12-22 MED ORDER — IOHEXOL 300 MG/ML  SOLN
100.0000 mL | Freq: Once | INTRAMUSCULAR | Status: AC | PRN
  Administered 2012-12-22: 50 mL via INTRAVENOUS

## 2012-12-22 MED ORDER — ALTEPLASE 2 MG IJ SOLR
2.0000 mg | Freq: Once | INTRAMUSCULAR | Status: AC
  Administered 2012-12-22: 2 mg
  Filled 2012-12-22: qty 2

## 2012-12-22 NOTE — ED Notes (Signed)
Pt will not be receiving sedation as he is driving himself

## 2012-12-22 NOTE — H&P (Signed)
Chief Complaint: Clotted (L)UE AV graft HPI: [name] is an 42 y.o. male with ESRD. Had a (L)UE AVG created back in January and has been working well. Last had HD 2 days ago, worked fine. Went for HD today, graft was found to be clotted. Now scheduled for declot procedure with IR. Feels well, has had prior PTA/shuntagram and is familiar with procedure. PMHx and meds reviewed.  Past Medical History:  Past Medical History  Diagnosis Date  . Non-ischemic cardiomyopathy     EF 20-25% 12/23/2008>>EF 40-45% 03/2009>>EF ??? 7/?/2011  . ESRD (end stage renal disease) on dialysis     1.Initiated HD via right per cate 12/30/2008 (MWF schedule at [facility]) 2.Left upper extremity A-V fistula by Dr Oneida Alar 12/30/2008 3.Aemia: Received Venofer 12/30/08 Ferritin 179 % sat 12 12/23/08, 4. Secondary Hyperparathyroidism- PTH 188, P 5.4, Ca 9.2 12/2008  . Diabetes mellitus 12/2008    A1C 8.3 12/23/08, 24hr urine protein 2793 mg/day, SPEP neg, proliferative BDR s/p photocoagulation 08/2009 done at [facility] and f/u by DR Groat  . Hypertension     resloved after initating HD, now with post HD hypotension  . Hemodialysis-associated hypotension     coreg d/c'd  . Anemia   . CHF (congestive heart failure)   . Complication of anesthesia   . PONV (postoperative nausea and vomiting)   . History of blood transfusion   . Psoriasis     scalp    Past Surgical History:  Past Surgical History  Procedure Laterality Date  . Cataract extraction, bilateral    . Av fistula placement      left  . Photocoagulation      for diabetic retinopathy 08/1999  . Insertion of dialysis catheter  07/14/2012    Procedure: INSERTION OF DIALYSIS CATHETER;  Surgeon: Mal Misty, MD;  Location: [facility];  Service: Vascular;  Laterality: Right;  Ultrasound Guided Insertion of Internal Jugular Dialysis Catheter  . Eye surgery    . Kidney transplant  2013    did not work  . Nephrectomy transplanted organ  08/11/12  . Av fistula  placement  09/12/2012    Procedure: INSERTION OF ARTERIOVENOUS (AV) GORE-TEX GRAFT ARM;  Surgeon: Elam Dutch, MD;  Location: [facility] OR;  Service: Vascular;  Laterality: Left;  KIDNEY TRANSPLANT FAILED    Family History:  Family History  Problem Relation Age of Onset  . Hypertension Mother   . Diabetes Father   . Kidney disease Father   . Other Father     amputation  . Coronary artery disease Neg Hx     no hx of CHF, sudden cardiac death, or CAD  . Hypertension Brother   . Other Brother     bleeding problems    Social History:  reports that he has never smoked. He has never used smokeless tobacco. He reports that he does not drink alcohol or use illicit drugs.  Allergies:  Allergies  Allergen Reactions  . Tape Other (See Comments)    Only tolerated paper tape    Medications: aspirin EC 81 MG tablet (Taking) Sig - Route: Take 81 mg by mouth daily. - Oral Class: Historical Med B Complex-C-Folic Acid (RENA-VITE PO) (Taking) Sig - Route: Take 1 tablet by mouth daily. - Oral Class: Historical Med Number of times this order has been changed since signing: 1 Order [address] Blood Glucose Monitoring Suppl (FREESTYLE FREEDOM LITE) W/DEVICE KIT (Taking) 1 each 0 06/06/2012 Sig: Check blood sugar once daily as instructed  dx code 250.00 Class: Sample Number of times this order has been changed since signing: 1 Order [address] calcium carbonate (OS-CAL - DOSED IN MG OF ELEMENTAL CALCIUM) 1250 MG tablet (Taking) Sig - Route: Take 1 tablet by mouth 3 (three) times daily with meals. - Oral Class: Historical Med carvedilol (COREG CR) 10 MG 24 hr capsule (Taking) Sig - Route: Take 10 mg by mouth daily. - Oral Class: Historical Med Number of times this order has been changed since signing: 1 Order [address] Trail clotrimazole (LOTRIMIN) 1 % cream (Taking) 60 g 0 08/23/2012 Sig: APPLY TOPICALLY TWICE A DAY Number of times this order has been changed since signing: 2 Order [address] Trail docusate sodium  (DULCOLAX) 100 MG capsule (Taking) Sig - Route: Take 100 mg by mouth daily as needed. For constipation - Oral Class: Historical Med Number of times this order has been changed since signing: 2 Order [address] glipiZIDE (GLUCOTROL XL) 10 MG 24 hr tablet (Taking) 30 tablet 11 08/03/2012 Sig - Route: Take 1 tablet (10 mg total) by mouth daily. - Oral Number of times this order has been changed since signing: 2 Order [address] glucose blood (FREESTYLE LITE) test strip (Taking) 32 each 5 06/19/2012 Sig: Check blood sugar once daily as instructed dx code 250.00 Number of times this order has been changed since signing: 1 Order [address] ketoconazole (NIZORAL) 2 % shampoo (Taking) 120 mL 1 08/03/2012 Sig - Route: Apply topically 2 (two) times a week. Wash hair and beard. Leave shampoo on skin for 5 mins before washing off. - Topical Number of times this order has been changed since signing: 2 Order [address] Lancets 30G MISC (Taking) 100 each 5 06/06/2012 Sig: Use to check blood sugar once daily as instructed dx code 250.00 Class: Print Number of times this order has been changed since signing: 1 Order [address] Trail omeprazole (PRILOSEC) 20 MG capsule (Taking) Sig - Route: Take 20 mg by mouth daily. - Oral Class: Historical Med Number of times this order has been changed since signing: 1 Order [address] promethazine (PHENERGAN) 25 MG tablet (Taking) Sig - Route: Take 25 mg by mouth every 8 (eight) hours as needed. For nausea/vomiting - Oral Class: Historical Med triamcinolone (KENALOG) 0.025 % ointment (Taking) 80 g 1 08/03/2012 Sig - Route: Apply topically 2 (two) times daily. Apply to nose and beard, in areas of dry skin - Topic  Please HPI for pertinent positives, otherwise complete 10 system ROS negative.  Physical Exam: Blood pressure 118/73, pulse 85, temperature 98.5 F (36.9 C), SpO2 99.00%. There is no weight on file to calculate BMI.   General Appearance:  Alert, cooperative, no distress, appears  stated age  Head:  Normocephalic, without obvious abnormality, atraumatic  ENT: Unremarkable  Neck: Supple, symmetrical, trachea midline, no adenopathy, thyroid: not enlarged, symmetric, no tenderness/mass/nodules  Lungs:   Clear to auscultation bilaterally, no w/r/r, respirations unlabored without use of accessory muscles.  Chest Wall:  No tenderness or deformity  Heart:  Regular rate and rhythm, S1, S2 normal, no murmur, rub or gallop. Carotids 2+ without bruit.  Extremities: (L)UE forearm loop graft without pulse/thrill. Hand warm, good radial pulse  Neurologic: Normal affect, no gross deficits.   No results found for this or any previous visit (from the past 48 hour(s)). No results found.  Assessment/Plan Thrombosed (L)UE loop AVG For thrombolysis, thrombectomy, poss angioplasty, poss HD catheter. Discussed procedure, risks, complications. Cannot use sedation as pt drove himself. No labs. Consent  signed in chart  Ascencion Dike PA-C 12/22/2012, 1:10 PM

## 2012-12-22 NOTE — ED Notes (Signed)
K+ of 5.7 called to [employer] PA; no new orders received. Pt to have HD in the am

## 2012-12-26 ENCOUNTER — Other Ambulatory Visit: Payer: Self-pay | Admitting: *Deleted

## 2012-12-26 IMAGING — CR DG CHEST 2V
2 series · 2 of 2 positions shown · non-contrast
Comparison: Chest radiograph 04/22/2009.

CLINICAL DATA: Preoperative chest radiograph.  Thrombectomy and
left graft placement.

CHEST - 2 VIEW

[view not recorded (1 of 2)]
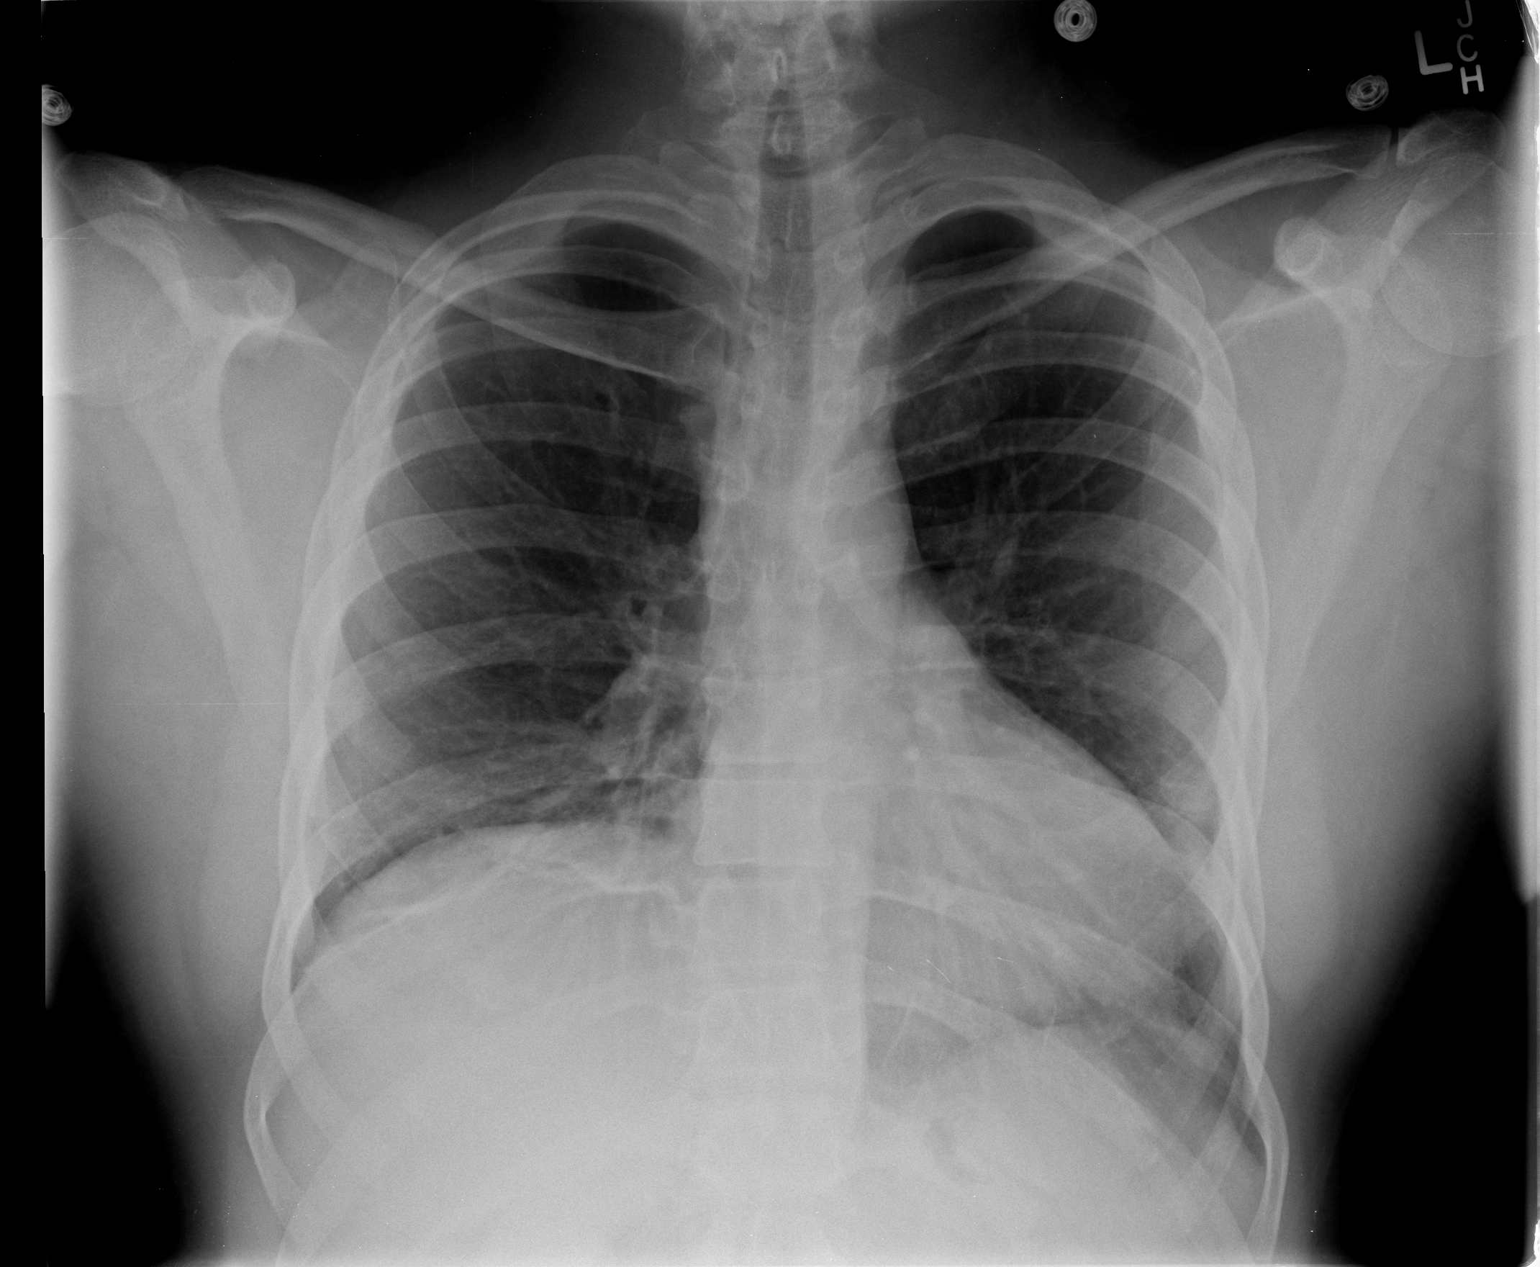

[view not recorded (2 of 2)]
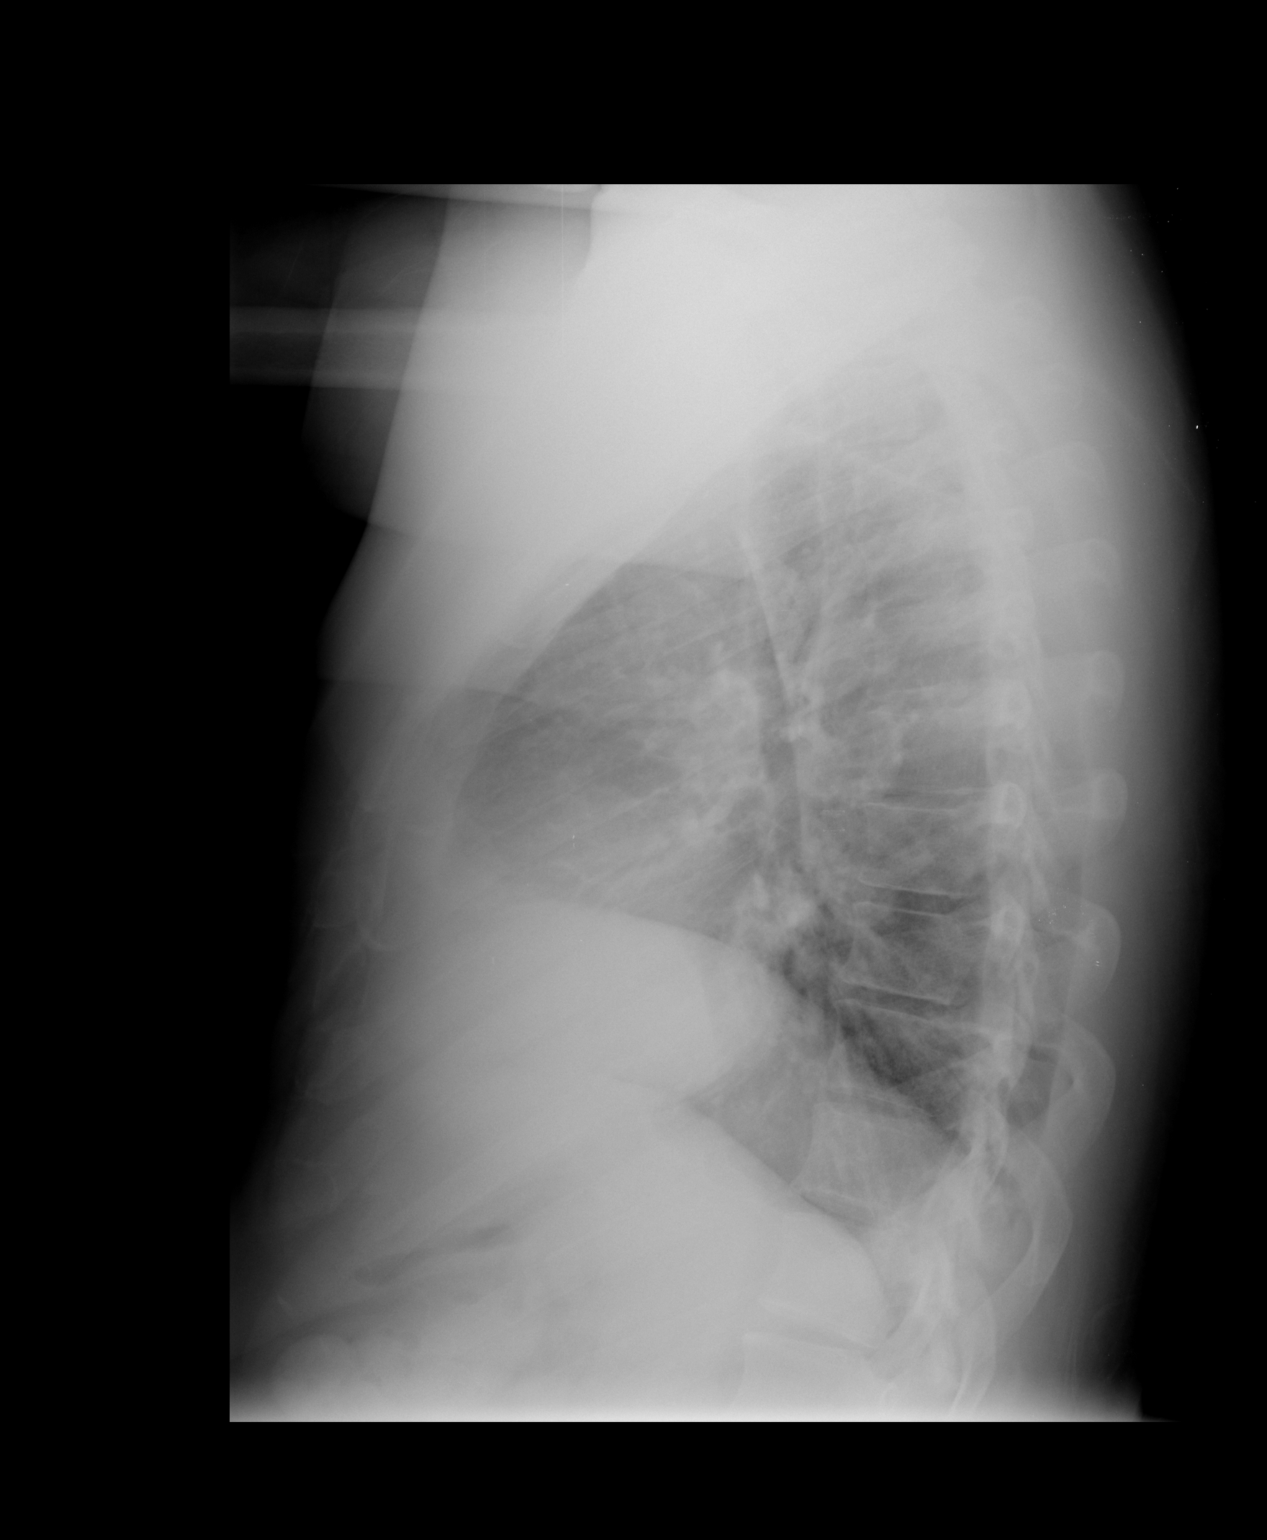

[2 of 2 positions shown; findings below may reference images not displayed]

FINDINGS: Right IJ dialysis catheter is no longer present.  There
is volume loss at the right lung base with subsegmental atelectasis
or scarring.  This appears discoid on the frontal view.  More
prominent elevation of the right hemidiaphragm associated with
volume loss.  There is no airspace disease.  Left lung appears
clear.  Cardiopericardial silhouette and mediastinal contours
appear within normal limits.
IMPRESSION: 1.  Increasing volume loss at the right lung base, likely secondary
to discoid atelectasis.  Consider follow-up repeat PA and lateral
with full inspiration to assess for resolution.
2.  No acute cardiopulmonary disease.

## 2012-12-26 MED ORDER — GLIPIZIDE ER 10 MG PO TB24: 10.0000 mg | ORAL_TABLET | Freq: Every day | ORAL | Status: DC

## 2012-12-26 NOTE — Telephone Encounter (Signed)
Pt's mother requesting rx to be sent to [facility] b/c the mail order pharmacy is requesting $50.00 plus the price of the medication, which they do not have until next month.  And he's completely out of Glipizide.  Thanks

## 2012-12-26 NOTE — Telephone Encounter (Signed)
Pt's mother called about Glipizide refill; no answer - message left.

## 2013-01-13 DIAGNOSIS — N186 End stage renal disease: Secondary | ICD-10-CM | POA: Diagnosis not present

## 2013-01-15 DIAGNOSIS — N2581 Secondary hyperparathyroidism of renal origin: Secondary | ICD-10-CM | POA: Diagnosis not present

## 2013-01-15 DIAGNOSIS — N186 End stage renal disease: Secondary | ICD-10-CM | POA: Diagnosis not present

## 2013-01-15 DIAGNOSIS — D509 Iron deficiency anemia, unspecified: Secondary | ICD-10-CM | POA: Diagnosis not present

## 2013-01-17 DIAGNOSIS — D509 Iron deficiency anemia, unspecified: Secondary | ICD-10-CM | POA: Diagnosis not present

## 2013-01-17 DIAGNOSIS — N2581 Secondary hyperparathyroidism of renal origin: Secondary | ICD-10-CM | POA: Diagnosis not present

## 2013-01-17 DIAGNOSIS — N186 End stage renal disease: Secondary | ICD-10-CM | POA: Diagnosis not present

## 2013-01-19 DIAGNOSIS — N2581 Secondary hyperparathyroidism of renal origin: Secondary | ICD-10-CM | POA: Diagnosis not present

## 2013-01-19 DIAGNOSIS — D509 Iron deficiency anemia, unspecified: Secondary | ICD-10-CM | POA: Diagnosis not present

## 2013-01-19 DIAGNOSIS — N186 End stage renal disease: Secondary | ICD-10-CM | POA: Diagnosis not present

## 2013-01-22 DIAGNOSIS — N2581 Secondary hyperparathyroidism of renal origin: Secondary | ICD-10-CM | POA: Diagnosis not present

## 2013-01-22 DIAGNOSIS — N186 End stage renal disease: Secondary | ICD-10-CM | POA: Diagnosis not present

## 2013-01-22 DIAGNOSIS — D509 Iron deficiency anemia, unspecified: Secondary | ICD-10-CM | POA: Diagnosis not present

## 2013-01-24 DIAGNOSIS — D509 Iron deficiency anemia, unspecified: Secondary | ICD-10-CM | POA: Diagnosis not present

## 2013-01-24 DIAGNOSIS — N186 End stage renal disease: Secondary | ICD-10-CM | POA: Diagnosis not present

## 2013-01-24 DIAGNOSIS — N2581 Secondary hyperparathyroidism of renal origin: Secondary | ICD-10-CM | POA: Diagnosis not present

## 2013-01-26 DIAGNOSIS — D509 Iron deficiency anemia, unspecified: Secondary | ICD-10-CM | POA: Diagnosis not present

## 2013-01-26 DIAGNOSIS — N186 End stage renal disease: Secondary | ICD-10-CM | POA: Diagnosis not present

## 2013-01-26 DIAGNOSIS — N2581 Secondary hyperparathyroidism of renal origin: Secondary | ICD-10-CM | POA: Diagnosis not present

## 2013-01-29 DIAGNOSIS — D509 Iron deficiency anemia, unspecified: Secondary | ICD-10-CM | POA: Diagnosis not present

## 2013-01-29 DIAGNOSIS — N186 End stage renal disease: Secondary | ICD-10-CM | POA: Diagnosis not present

## 2013-01-29 DIAGNOSIS — N2581 Secondary hyperparathyroidism of renal origin: Secondary | ICD-10-CM | POA: Diagnosis not present

## 2013-01-31 DIAGNOSIS — N186 End stage renal disease: Secondary | ICD-10-CM | POA: Diagnosis not present

## 2013-01-31 DIAGNOSIS — N2581 Secondary hyperparathyroidism of renal origin: Secondary | ICD-10-CM | POA: Diagnosis not present

## 2013-01-31 DIAGNOSIS — D509 Iron deficiency anemia, unspecified: Secondary | ICD-10-CM | POA: Diagnosis not present

## 2013-02-02 DIAGNOSIS — N2581 Secondary hyperparathyroidism of renal origin: Secondary | ICD-10-CM | POA: Diagnosis not present

## 2013-02-02 DIAGNOSIS — D509 Iron deficiency anemia, unspecified: Secondary | ICD-10-CM | POA: Diagnosis not present

## 2013-02-02 DIAGNOSIS — N186 End stage renal disease: Secondary | ICD-10-CM | POA: Diagnosis not present

## 2013-02-05 DIAGNOSIS — N186 End stage renal disease: Secondary | ICD-10-CM | POA: Diagnosis not present

## 2013-02-05 DIAGNOSIS — D509 Iron deficiency anemia, unspecified: Secondary | ICD-10-CM | POA: Diagnosis not present

## 2013-02-05 DIAGNOSIS — N2581 Secondary hyperparathyroidism of renal origin: Secondary | ICD-10-CM | POA: Diagnosis not present

## 2013-02-07 DIAGNOSIS — N186 End stage renal disease: Secondary | ICD-10-CM | POA: Diagnosis not present

## 2013-02-07 DIAGNOSIS — D509 Iron deficiency anemia, unspecified: Secondary | ICD-10-CM | POA: Diagnosis not present

## 2013-02-07 DIAGNOSIS — N2581 Secondary hyperparathyroidism of renal origin: Secondary | ICD-10-CM | POA: Diagnosis not present

## 2013-02-09 DIAGNOSIS — D509 Iron deficiency anemia, unspecified: Secondary | ICD-10-CM | POA: Diagnosis not present

## 2013-02-09 DIAGNOSIS — N2581 Secondary hyperparathyroidism of renal origin: Secondary | ICD-10-CM | POA: Diagnosis not present

## 2013-02-09 DIAGNOSIS — N186 End stage renal disease: Secondary | ICD-10-CM | POA: Diagnosis not present

## 2013-02-12 DIAGNOSIS — D509 Iron deficiency anemia, unspecified: Secondary | ICD-10-CM | POA: Diagnosis not present

## 2013-02-12 DIAGNOSIS — N2581 Secondary hyperparathyroidism of renal origin: Secondary | ICD-10-CM | POA: Diagnosis not present

## 2013-02-12 DIAGNOSIS — N186 End stage renal disease: Secondary | ICD-10-CM | POA: Diagnosis not present

## 2013-02-14 DIAGNOSIS — N186 End stage renal disease: Secondary | ICD-10-CM | POA: Diagnosis not present

## 2013-02-14 DIAGNOSIS — D509 Iron deficiency anemia, unspecified: Secondary | ICD-10-CM | POA: Diagnosis not present

## 2013-02-14 DIAGNOSIS — N2581 Secondary hyperparathyroidism of renal origin: Secondary | ICD-10-CM | POA: Diagnosis not present

## 2013-03-07 DIAGNOSIS — E119 Type 2 diabetes mellitus without complications: Secondary | ICD-10-CM | POA: Diagnosis not present

## 2013-03-15 DIAGNOSIS — N186 End stage renal disease: Secondary | ICD-10-CM | POA: Diagnosis not present

## 2013-03-16 DIAGNOSIS — N186 End stage renal disease: Secondary | ICD-10-CM | POA: Diagnosis not present

## 2013-03-16 DIAGNOSIS — N2581 Secondary hyperparathyroidism of renal origin: Secondary | ICD-10-CM | POA: Diagnosis not present

## 2013-03-19 DIAGNOSIS — N2581 Secondary hyperparathyroidism of renal origin: Secondary | ICD-10-CM | POA: Diagnosis not present

## 2013-03-19 DIAGNOSIS — N186 End stage renal disease: Secondary | ICD-10-CM | POA: Diagnosis not present

## 2013-03-21 DIAGNOSIS — N2581 Secondary hyperparathyroidism of renal origin: Secondary | ICD-10-CM | POA: Diagnosis not present

## 2013-03-21 DIAGNOSIS — N186 End stage renal disease: Secondary | ICD-10-CM | POA: Diagnosis not present

## 2013-03-23 DIAGNOSIS — N186 End stage renal disease: Secondary | ICD-10-CM | POA: Diagnosis not present

## 2013-03-23 DIAGNOSIS — N2581 Secondary hyperparathyroidism of renal origin: Secondary | ICD-10-CM | POA: Diagnosis not present

## 2013-03-26 DIAGNOSIS — N186 End stage renal disease: Secondary | ICD-10-CM | POA: Diagnosis not present

## 2013-03-26 DIAGNOSIS — N2581 Secondary hyperparathyroidism of renal origin: Secondary | ICD-10-CM | POA: Diagnosis not present

## 2013-03-27 DIAGNOSIS — I871 Compression of vein: Secondary | ICD-10-CM | POA: Diagnosis not present

## 2013-03-27 DIAGNOSIS — T82898A Other specified complication of vascular prosthetic devices, implants and grafts, initial encounter: Secondary | ICD-10-CM | POA: Diagnosis not present

## 2013-03-27 DIAGNOSIS — N186 End stage renal disease: Secondary | ICD-10-CM | POA: Diagnosis not present

## 2013-03-28 DIAGNOSIS — N2581 Secondary hyperparathyroidism of renal origin: Secondary | ICD-10-CM | POA: Diagnosis not present

## 2013-03-28 DIAGNOSIS — N186 End stage renal disease: Secondary | ICD-10-CM | POA: Diagnosis not present

## 2013-03-30 DIAGNOSIS — N2581 Secondary hyperparathyroidism of renal origin: Secondary | ICD-10-CM | POA: Diagnosis not present

## 2013-03-30 DIAGNOSIS — N186 End stage renal disease: Secondary | ICD-10-CM | POA: Diagnosis not present

## 2013-04-02 DIAGNOSIS — N186 End stage renal disease: Secondary | ICD-10-CM | POA: Diagnosis not present

## 2013-04-02 DIAGNOSIS — N2581 Secondary hyperparathyroidism of renal origin: Secondary | ICD-10-CM | POA: Diagnosis not present

## 2013-04-04 DIAGNOSIS — N2581 Secondary hyperparathyroidism of renal origin: Secondary | ICD-10-CM | POA: Diagnosis not present

## 2013-04-04 DIAGNOSIS — N186 End stage renal disease: Secondary | ICD-10-CM | POA: Diagnosis not present

## 2013-04-06 DIAGNOSIS — N2581 Secondary hyperparathyroidism of renal origin: Secondary | ICD-10-CM | POA: Diagnosis not present

## 2013-04-06 DIAGNOSIS — N186 End stage renal disease: Secondary | ICD-10-CM | POA: Diagnosis not present

## 2013-04-09 DIAGNOSIS — N2581 Secondary hyperparathyroidism of renal origin: Secondary | ICD-10-CM | POA: Diagnosis not present

## 2013-04-09 DIAGNOSIS — N186 End stage renal disease: Secondary | ICD-10-CM | POA: Diagnosis not present

## 2013-04-11 DIAGNOSIS — N2581 Secondary hyperparathyroidism of renal origin: Secondary | ICD-10-CM | POA: Diagnosis not present

## 2013-04-11 DIAGNOSIS — N186 End stage renal disease: Secondary | ICD-10-CM | POA: Diagnosis not present

## 2013-04-13 DIAGNOSIS — N2581 Secondary hyperparathyroidism of renal origin: Secondary | ICD-10-CM | POA: Diagnosis not present

## 2013-04-13 DIAGNOSIS — N186 End stage renal disease: Secondary | ICD-10-CM | POA: Diagnosis not present

## 2013-04-15 DIAGNOSIS — N186 End stage renal disease: Secondary | ICD-10-CM | POA: Diagnosis not present

## 2013-04-16 DIAGNOSIS — N2581 Secondary hyperparathyroidism of renal origin: Secondary | ICD-10-CM | POA: Diagnosis not present

## 2013-04-16 DIAGNOSIS — N186 End stage renal disease: Secondary | ICD-10-CM | POA: Diagnosis not present

## 2013-04-18 DIAGNOSIS — N2581 Secondary hyperparathyroidism of renal origin: Secondary | ICD-10-CM | POA: Diagnosis not present

## 2013-04-18 DIAGNOSIS — N186 End stage renal disease: Secondary | ICD-10-CM | POA: Diagnosis not present

## 2013-04-20 DIAGNOSIS — N186 End stage renal disease: Secondary | ICD-10-CM | POA: Diagnosis not present

## 2013-04-20 DIAGNOSIS — N2581 Secondary hyperparathyroidism of renal origin: Secondary | ICD-10-CM | POA: Diagnosis not present

## 2013-04-23 DIAGNOSIS — N186 End stage renal disease: Secondary | ICD-10-CM | POA: Diagnosis not present

## 2013-04-23 DIAGNOSIS — N2581 Secondary hyperparathyroidism of renal origin: Secondary | ICD-10-CM | POA: Diagnosis not present

## 2013-04-25 DIAGNOSIS — N186 End stage renal disease: Secondary | ICD-10-CM | POA: Diagnosis not present

## 2013-04-25 DIAGNOSIS — N2581 Secondary hyperparathyroidism of renal origin: Secondary | ICD-10-CM | POA: Diagnosis not present

## 2013-04-27 DIAGNOSIS — N186 End stage renal disease: Secondary | ICD-10-CM | POA: Diagnosis not present

## 2013-04-27 DIAGNOSIS — N2581 Secondary hyperparathyroidism of renal origin: Secondary | ICD-10-CM | POA: Diagnosis not present

## 2013-04-30 DIAGNOSIS — N186 End stage renal disease: Secondary | ICD-10-CM | POA: Diagnosis not present

## 2013-04-30 DIAGNOSIS — N2581 Secondary hyperparathyroidism of renal origin: Secondary | ICD-10-CM | POA: Diagnosis not present

## 2013-05-02 DIAGNOSIS — N2581 Secondary hyperparathyroidism of renal origin: Secondary | ICD-10-CM | POA: Diagnosis not present

## 2013-05-02 DIAGNOSIS — N186 End stage renal disease: Secondary | ICD-10-CM | POA: Diagnosis not present

## 2013-05-04 DIAGNOSIS — N186 End stage renal disease: Secondary | ICD-10-CM | POA: Diagnosis not present

## 2013-05-04 DIAGNOSIS — N2581 Secondary hyperparathyroidism of renal origin: Secondary | ICD-10-CM | POA: Diagnosis not present

## 2013-05-07 DIAGNOSIS — N186 End stage renal disease: Secondary | ICD-10-CM | POA: Diagnosis not present

## 2013-05-07 DIAGNOSIS — N2581 Secondary hyperparathyroidism of renal origin: Secondary | ICD-10-CM | POA: Diagnosis not present

## 2013-05-09 DIAGNOSIS — N186 End stage renal disease: Secondary | ICD-10-CM | POA: Diagnosis not present

## 2013-05-09 DIAGNOSIS — N2581 Secondary hyperparathyroidism of renal origin: Secondary | ICD-10-CM | POA: Diagnosis not present

## 2013-05-11 DIAGNOSIS — N2581 Secondary hyperparathyroidism of renal origin: Secondary | ICD-10-CM | POA: Diagnosis not present

## 2013-05-11 DIAGNOSIS — N186 End stage renal disease: Secondary | ICD-10-CM | POA: Diagnosis not present

## 2013-05-14 DIAGNOSIS — N2581 Secondary hyperparathyroidism of renal origin: Secondary | ICD-10-CM | POA: Diagnosis not present

## 2013-05-14 DIAGNOSIS — N186 End stage renal disease: Secondary | ICD-10-CM | POA: Diagnosis not present

## 2013-05-15 DIAGNOSIS — N186 End stage renal disease: Secondary | ICD-10-CM | POA: Diagnosis not present

## 2013-05-16 DIAGNOSIS — N2581 Secondary hyperparathyroidism of renal origin: Secondary | ICD-10-CM | POA: Diagnosis not present

## 2013-05-16 DIAGNOSIS — N186 End stage renal disease: Secondary | ICD-10-CM | POA: Diagnosis not present

## 2013-05-16 DIAGNOSIS — Z23 Encounter for immunization: Secondary | ICD-10-CM | POA: Diagnosis not present

## 2013-05-24 ENCOUNTER — Ambulatory Visit (INDEPENDENT_AMBULATORY_CARE_PROVIDER_SITE_OTHER): Payer: Medicare Other | Admitting: Internal Medicine

## 2013-05-24 ENCOUNTER — Encounter: Payer: Self-pay | Admitting: Dietician

## 2013-05-24 ENCOUNTER — Encounter: Payer: Self-pay | Admitting: Internal Medicine

## 2013-05-24 VITALS — BP 121/75 | HR 72 | Temp 97.2°F | Ht 65.0 in | Wt 169.1 lb

## 2013-05-24 DIAGNOSIS — E11359 Type 2 diabetes mellitus with proliferative diabetic retinopathy without macular edema: Secondary | ICD-10-CM | POA: Insufficient documentation

## 2013-05-24 DIAGNOSIS — E11311 Type 2 diabetes mellitus with unspecified diabetic retinopathy with macular edema: Secondary | ICD-10-CM | POA: Insufficient documentation

## 2013-05-24 DIAGNOSIS — E118 Type 2 diabetes mellitus with unspecified complications: Secondary | ICD-10-CM

## 2013-05-24 DIAGNOSIS — E1165 Type 2 diabetes mellitus with hyperglycemia: Secondary | ICD-10-CM | POA: Diagnosis not present

## 2013-05-24 DIAGNOSIS — E113599 Type 2 diabetes mellitus with proliferative diabetic retinopathy without macular edema, unspecified eye: Secondary | ICD-10-CM | POA: Insufficient documentation

## 2013-05-24 LAB — POCT GLYCOSYLATED HEMOGLOBIN (HGB A1C): Hemoglobin A1C: 9.1

## 2013-05-24 MED ORDER — SITAGLIPTIN PHOSPHATE 25 MG PO TABS: 25.0000 mg | ORAL_TABLET | Freq: Every day | ORAL | Status: DC

## 2013-05-24 MED ORDER — GLIPIZIDE ER 10 MG PO TB24: 10.0000 mg | ORAL_TABLET | Freq: Every day | ORAL | Status: DC

## 2013-05-24 MED ORDER — KETOCONAZOLE 2 % EX SHAM: MEDICATED_SHAMPOO | CUTANEOUS | Status: DC

## 2013-05-24 MED ORDER — TRIAMCINOLONE ACETONIDE 0.025 % EX OINT: TOPICAL_OINTMENT | Freq: Two times a day (BID) | CUTANEOUS | Status: DC

## 2013-05-24 MED ORDER — GLUCOSE BLOOD VI STRP: ORAL_STRIP | Status: DC

## 2013-05-24 NOTE — Assessment & Plan Note (Signed)
Lab Results  Component Value Date   HGBA1C 9.1 05/24/2013   HGBA1C 7.5 11/22/2012   HGBA1C 9.4 06/06/2012     Assessment: Diabetes control: poor control (HgbA1C >9%) Progress toward A1C goal:  deteriorated Comments: The patient's A1c has deteriorated from 7.5-9.1. He notes diet and exercise as the biggest contributing factors. We discussed options, including non-insulin hypoglycemic therapies, as well as insulin. The patient appears to understand the importance of lowering his blood sugar, and is not opposed to trying new medications, but wants to try intensive diet and exercise first, and if A1c is still elevated at his next visit, he agrees to further treatment.  Plan: Medications:  Continue glipizide and Januvia. At next visit, consider Lantus versus ?[place] Home glucose monitoring: Frequency:   Timing:   Instruction/counseling given: reminded to bring blood glucose meter & log to each visit Educational resources provided: brochure Self management tools provided:  (LOST METER) Other plans: Freestyle glucometer given to patient today, along with prescription for test strips

## 2013-05-24 NOTE — Progress Notes (Signed)
HPI The patient is a 42 y.o. male with a history of DM, ESRD, HTN, presenting for a routine follow-up.  The patient has a history of DM.  The patient's blood sugar is 211 today.  He hasn't been checking his blood sugars at home due to losing his glucometer.  His wife notes that he has been having more dietary indiscretions lately, and that he has not been exercising as much lately.  Weight has increased by 9 lbs since his last visit.  The patient missed a recent eye exam, but has an upcoming appointment with Dr. Thomasenia Bottoms on 07/24/12 at 10:00 am.  The patient received his flu shot at his HD center.  BP is well-controlled today.  ROS: General: no fevers, chills, changes in weight, changes in appetite Skin: no rash HEENT: no blurry vision, hearing changes, sore throat Pulm: no dyspnea, coughing, wheezing CV: no chest pain, palpitations, shortness of breath Abd: no abdominal pain, nausea/vomiting, diarrhea/constipation GU: ESRD Ext: no arthralgias, myalgias Neuro: no weakness, numbness, or tingling  Filed Vitals:   05/24/13 1104  BP: 121/75  Pulse: 72  Temp: 97.2 F (36.2 C)    PEX General: alert, cooperative, and in no apparent distress HEENT: pupils equal round and reactive to light, vision grossly intact, oropharynx clear and non-erythematous  Neck: supple Lungs: clear to ascultation bilaterally, normal work of respiration, no wheezes, rales, ronchi Heart: regular rate and rhythm, no murmurs, gallops, or rubs Abdomen: soft, non-tender, non-distended, normal bowel sounds Extremities: no cyanosis, clubbing, or edema Neurologic: alert & oriented X3, cranial nerves II-XII intact, strength grossly intact, sensation intact to light touch  Current Outpatient Prescriptions on File Prior to Visit  Medication Sig Dispense Refill  . aspirin EC 81 MG tablet Take 81 mg by mouth daily.      . B Complex-C-Folic Acid (RENA-VITE PO) Take 1 tablet by mouth daily.      . Blood Glucose Monitoring  Suppl (FREESTYLE FREEDOM LITE) W/DEVICE KIT Check blood sugar once daily as instructed dx code 250.00  1 each  0  . calcium carbonate (OS-CAL - DOSED IN MG OF ELEMENTAL CALCIUM) 1250 MG tablet Take 1 tablet by mouth 3 (three) times daily with meals.      . carvedilol (COREG CR) 10 MG 24 hr capsule Take 10 mg by mouth daily.      . clotrimazole (LOTRIMIN) 1 % cream Apply topically 2 (two) times daily. To flakey skin around face and scalp  60 g  11  . docusate sodium (DULCOLAX) 100 MG capsule Take 100 mg by mouth daily as needed. For constipation      . glipiZIDE (GLUCOTROL XL) 10 MG 24 hr tablet Take 1 tablet (10 mg total) by mouth daily.  30 tablet  11  . glucose blood (FREESTYLE LITE) test strip Check blood sugar once daily as instructed dx code 250.00  32 each  5  . ketoconazole (NIZORAL) 2 % shampoo Apply topically 2 (two) times a week. Wash hair and beard.  Leave shampoo on skin for 5 mins before washing off.  120 mL  1  . Lancets 30G MISC Use to check blood sugar once daily as instructed dx code 250.00  100 each  5  . promethazine (PHENERGAN) 25 MG tablet Take 25 mg by mouth every 8 (eight) hours as needed. For nausea/vomiting      . sitaGLIPtin (JANUVIA) 25 MG tablet Take 1 tablet (25 mg total) by mouth daily.  30 tablet  5  .  triamcinolone (KENALOG) 0.025 % ointment Apply topically 2 (two) times daily. Apply to nose and beard, in areas of dry skin  80 g  1   No current facility-administered medications on file prior to visit.    Assessment/Plan

## 2013-05-24 NOTE — Patient Instructions (Signed)
General Instructions: For your diabetes, your A1C has increased from 7.5 to 9.1. -continue to try diet and exercise changes -continue to use glipizide and januvia -if your A1C is still elevated at your next visit, we will likely need to change your medications  The flaky skin around the face and scalp is due to a condition called seborrheic dermatitis -use Ketoconazole shampoo daily (or at least twice/week) for hair and beard -use Triamcinolone cream twice per day for additional treatment if needed -if symptoms do not improve, I am happy to refer you to a dermatologist  Please return for a follow-up visit in 3 months.   Treatment Goals:  Goals (1 Years of Data) as of 05/24/13   None      Progress Toward Treatment Goals:  Treatment Goal 05/24/2013  Hemoglobin A1C deteriorated  Blood pressure at goal    Self Care Goals & Plans:  Self Care Goal 05/24/2013  Manage my medications -  Monitor my health keep track of my blood glucose; bring my glucose meter and log to each visit; keep track of my blood pressure; check my feet daily  Eat healthy foods drink diet soda or water instead of juice or soda; eat foods that are low in salt; eat fruit for snacks and desserts; eat baked foods instead of fried foods  Be physically active -    Home Blood Glucose Monitoring 11/22/2012  Check my blood sugar once a day  When to check my blood sugar before breakfast     Care Management & Community Referrals:  Referral 05/24/2013  Referrals made for care management support none needed

## 2013-05-27 NOTE — Progress Notes (Signed)
Case discussed with Dr. Brown at the time of the visit.  We reviewed the resident's history and exam and pertinent patient test results.  I agree with the assessment, diagnosis, and plan of care documented in the resident's note. 

## 2013-05-31 ENCOUNTER — Other Ambulatory Visit: Payer: Self-pay | Admitting: *Deleted

## 2013-05-31 DIAGNOSIS — E1165 Type 2 diabetes mellitus with hyperglycemia: Secondary | ICD-10-CM

## 2013-05-31 MED ORDER — GLUCOSE BLOOD VI STRP: ORAL_STRIP | Status: DC

## 2013-06-15 DIAGNOSIS — N186 End stage renal disease: Secondary | ICD-10-CM | POA: Diagnosis not present

## 2013-06-18 DIAGNOSIS — N186 End stage renal disease: Secondary | ICD-10-CM | POA: Diagnosis not present

## 2013-06-18 DIAGNOSIS — N2581 Secondary hyperparathyroidism of renal origin: Secondary | ICD-10-CM | POA: Diagnosis not present

## 2013-07-15 DIAGNOSIS — N186 End stage renal disease: Secondary | ICD-10-CM | POA: Diagnosis not present

## 2013-07-16 DIAGNOSIS — N186 End stage renal disease: Secondary | ICD-10-CM | POA: Diagnosis not present

## 2013-07-16 DIAGNOSIS — N2581 Secondary hyperparathyroidism of renal origin: Secondary | ICD-10-CM | POA: Diagnosis not present

## 2013-08-15 DIAGNOSIS — N186 End stage renal disease: Secondary | ICD-10-CM | POA: Diagnosis not present

## 2013-08-16 DIAGNOSIS — T8610 Unspecified complication of kidney transplant: Secondary | ICD-10-CM

## 2013-08-16 HISTORY — DX: Unspecified complication of kidney transplant: T86.10

## 2013-08-17 DIAGNOSIS — N186 End stage renal disease: Secondary | ICD-10-CM | POA: Diagnosis not present

## 2013-08-23 ENCOUNTER — Ambulatory Visit: Payer: Medicare Other | Admitting: Internal Medicine

## 2013-08-23 ENCOUNTER — Encounter: Payer: Self-pay | Admitting: Internal Medicine

## 2013-08-23 ENCOUNTER — Ambulatory Visit (INDEPENDENT_AMBULATORY_CARE_PROVIDER_SITE_OTHER): Payer: Medicare Other | Admitting: Internal Medicine

## 2013-08-23 ENCOUNTER — Telehealth: Payer: Self-pay | Admitting: Dietician

## 2013-08-23 VITALS — BP 103/70 | HR 93 | Temp 97.2°F | Ht 65.0 in | Wt 168.8 lb

## 2013-08-23 DIAGNOSIS — E118 Type 2 diabetes mellitus with unspecified complications: Principal | ICD-10-CM

## 2013-08-23 DIAGNOSIS — I428 Other cardiomyopathies: Secondary | ICD-10-CM | POA: Diagnosis not present

## 2013-08-23 DIAGNOSIS — E1165 Type 2 diabetes mellitus with hyperglycemia: Secondary | ICD-10-CM

## 2013-08-23 DIAGNOSIS — E785 Hyperlipidemia, unspecified: Secondary | ICD-10-CM | POA: Diagnosis not present

## 2013-08-23 DIAGNOSIS — I871 Compression of vein: Secondary | ICD-10-CM | POA: Diagnosis not present

## 2013-08-23 DIAGNOSIS — IMO0002 Reserved for concepts with insufficient information to code with codable children: Secondary | ICD-10-CM

## 2013-08-23 DIAGNOSIS — N186 End stage renal disease: Secondary | ICD-10-CM

## 2013-08-23 DIAGNOSIS — T82898A Other specified complication of vascular prosthetic devices, implants and grafts, initial encounter: Secondary | ICD-10-CM | POA: Diagnosis not present

## 2013-08-23 DIAGNOSIS — I420 Dilated cardiomyopathy: Secondary | ICD-10-CM

## 2013-08-23 LAB — LIPID PANEL
CHOLESTEROL: 206 mg/dL — AB (ref 0–200)
HDL: 30 mg/dL — ABNORMAL LOW (ref >39)
TRIGLYCERIDES: 457 mg/dL — AB (ref <150)
Total CHOL/HDL Ratio: 6.9 Ratio

## 2013-08-23 LAB — GLUCOSE, CAPILLARY: Glucose-Capillary: 227 mg/dL — ABNORMAL HIGH (ref 70–99)

## 2013-08-23 LAB — POCT GLYCOSYLATED HEMOGLOBIN (HGB A1C): HEMOGLOBIN A1C: 8.6

## 2013-08-23 NOTE — Progress Notes (Signed)
Subjective:   Patient ID: [name] male   DOB: 08/09/1971 43 y.o.   MRN: [id number]  Chief Complaint  Patient presents with  . Diabetes    81mh follow up   HM: flu shot (given at HD), eye exam (patient to schedule), foot exam (today), lipids (today)  HPI: Alexis WStgermainis a 44y.o. man with h/o DM, ESRD (MWF, [facility], Dr. MMercy Moore complicated by anemia & hyperparathyroidism, HTN & sCHF (EF 45-50%) presents for routine DM follow up.  Patient was most recently seen in clinic on 05/24/13 for routine follow up with PCP.  Type 2 DM, Diagnosed: Mothers day ([birth year], 2000)  Hasn't checked CBGs at home lately, because there was some sort of prescription issue with test strips and lancets  Breakfast: oatmeal, scrambled eggs (with tomato, cheese, sometimes with toast) Lunch: after HD, cheeseburger from [place] or home bologna or PB & J sandwhich Dinner: oodles/noodles, chicken, green beans, corn, fish, bison, [place] Has had sx of hypoglycemia with feelings of nausea/stomach growling, knows he is low and then feels better after eating - happens in the mornings, 3x in the last week after skipping night time snack; no dizziness, lightheadedness, diaphoresis  Makes minimal urine. Limits self to 1L of fluid/day  Last Eye exam: 05/2012, [facility] but was rescheduled) Last Lipid Panel: 05/2012, will repeat today Exercise?  Walks alot Last visit with dietician: long time ago  Had vascular procedure (de-clot) in LUE today.   Review of Systems: Constitutional: Denies fever, chills, diaphoresis, appetite change and fatigue.  HEENT: Denies photophobia, eye pain, redness, hearing loss, ear pain, congestion, sore throat, rhinorrhea, sneezing, mouth sores, trouble swallowing, neck pain, neck stiffness and tinnitus.  Respiratory: Denies SOB, DOE, cough, chest tightness, and wheezing.  Cardiovascular: Denies chest pain, palpitations and  leg swelling.  Gastrointestinal: Denies vomiting, abdominal pain, diarrhea, constipation,blood in stool and abdominal distention.  Genitourinary: Denies dysuria, urgency, frequency, hematuria, flank pain and difficulty urinating.  Musculoskeletal: Denies back pain, joint swelling, arthralgias and gait problem. Occ cramps with HD Skin: rash on face from last visit improved  Neurological: Denies dizziness, seizures, syncope, weakness, lightheadedness, numbness and headaches.  Hematological: no obvious s/s of bleeding   Past Medical History  Diagnosis Date  . Non-ischemic cardiomyopathy     EF 20-25% 12/23/2008>>EF 40-45% 03/2009>>EF ??? 7/?/2011  . ESRD (end stage renal disease) on dialysis     1.Initiated [facility] via right per cate [facility]/30/2008 [facility] 2.Left upper extremity A-V fistula by Dr FOneida Alar05/17/2010 3.Aemia: Received Venofer 12/30/08 Ferritin 179 % sat 12 12/23/08, 4. Secondary Hyperparathyroidism- PTH 188, P 5.4, Ca 9.2 12/2008  . Diabetes mellitus 12/2008    A1C 8.3 12/23/08, 24hr urine protein 2793 mg/day, SPEP neg, proliferative B[facility] s[facility]  . Hypertension     resloved after initating HD, now with post HD hypotension  . Hemodialysis-associated hypotension     coreg d/c'd  . Anemia   . CHF (congestive heart failure)   . Complication of anesthesia   . PONV (postoperative nausea and vomiting)   . History of blood transfusion   . Psoriasis     scalp   Current Outpatient Prescriptions  Medication Sig Dispense Refill  . aspirin EC 81 MG tablet Take 81 mg by mouth daily.      . B Complex-C-Folic Acid (RENA-VITE PO)  Take 1 tablet by mouth daily.      . Blood Glucose Monitoring Suppl (FREESTYLE FREEDOM LITE) W/DEVICE KIT Check blood sugar once daily as instructed dx code 250.00  1 each  0  . calcium carbonate (OS-CAL - DOSED IN MG OF ELEMENTAL CALCIUM) 1250 MG tablet Take 1 tablet by mouth 3 (three) times  daily with meals.      . carvedilol (COREG CR) 10 MG 24 hr capsule Take 10 mg by mouth daily.      Marland Kitchen docusate sodium (DULCOLAX) 100 MG capsule Take 100 mg by mouth daily as needed. For constipation      . glipiZIDE (GLUCOTROL XL) 10 MG 24 hr tablet Take 1 tablet (10 mg total) by mouth daily.  30 tablet  11  . glucose blood (FREESTYLE LITE) test strip Check blood sugar once daily as instructed dx code 250.00.  100 each  5  . ketoconazole (NIZORAL) 2 % shampoo Apply topically 2 (two) times a week. Wash hair and beard.  Leave shampoo on skin for 5 mins before washing off.  120 mL  1  . Lancets 30G MISC Use to check blood sugar once daily as instructed dx code 250.00  100 each  5  . promethazine (PHENERGAN) 25 MG tablet Take 25 mg by mouth every 8 (eight) hours as needed. For nausea/vomiting      . sitaGLIPtin (JANUVIA) 25 MG tablet Take 1 tablet (25 mg total) by mouth daily.  30 tablet  11  . triamcinolone (KENALOG) 0.025 % ointment Apply topically 2 (two) times daily. Apply to nose and beard, in areas of dry skin  80 g  1   No current facility-administered medications for this visit.   Family History  Problem Relation Age of Onset  . Hypertension Mother   . Diabetes Father   . Kidney disease Father   . Other Father     amputation  . Coronary artery disease Neg Hx     no hx of CHF, sudden cardiac death, or CAD  . Hypertension Brother   . Other Brother     bleeding problems   History   Social History  . Marital Status: Married    Spouse Name: N/A    Number of Children: N/A  . Years of Education: N/A   Social History Main Topics  . Smoking status: Never Smoker   . Smokeless tobacco: Never Used  . Alcohol Use: No  . Drug Use: No  . Sexual Activity: Not on file   Other Topics Concern  . Not on file   Social History Narrative   Has two children. No risky sexual behavior.    Objective:  Physical Exam: Filed Vitals:   08/23/13 1327  BP: 103/70  Pulse: 93  Temp: 97.2 F  (36.2 C)  TempSrc: Oral  Height: 5' 5"  (1.651 m)  Weight: 168 lb 12.8 oz (76.567 kg)  SpO2: 95%   HEENT: PERRL, EOMI, no scleral icterus Cardiac: RRR, no rubs, murmurs or gallops, left forearm graft with thrill without s/s of infection/bleeding Pulm: clear to auscultation bilaterally, moving normal volumes of air Abd: soft, nontender, nondistended, BS normoactive Ext: warm and well perfused, no pedal edema Neuro: alert and oriented X3, cranial nerves II-XII grossly intact  Assessment & Plan:  Case and care discussed with Dr. Marinda Elk.  Please see problem oriented charting for further details. Patient to return in 1 month for review of CBGs and adjustment of DM meds.

## 2013-08-23 NOTE — Telephone Encounter (Signed)
Per pharmacy the test strips were filled in October and never picked up. She didn't say what happened but that she'd have it ready for him this afternoon and he needs to bring his medicare Red, white and blue card with him when he picks it up.  Eye appointment was Dec 30th, 2014. It was rescheduled today for  08-31-13 1:45 Pm with Dr. Ty Hilts.  Silvestre Gunner [phone]

## 2013-08-23 NOTE — Assessment & Plan Note (Signed)
Patient is not taking coreg, as he doesn't like how this makes him feel.  He wants to defer restarting until he schedules an appointment with his cardiologist, Dr. Stanford Breed.  I advised that given his h/o sCHF, he is at risk for worsening pump failure without this medication.  He wants to discuss with cardiologist.

## 2013-08-23 NOTE — Assessment & Plan Note (Signed)
Compliant with MWF HD at [facility]).  Underwent vascular procedure today of left forearm graft.

## 2013-08-23 NOTE — Patient Instructions (Addendum)
General Instructions:  -Please make appointments with Dr. Stanford Breed (your cardiologist) and your Eye doctor  -Please make an appointment to be seen by our dietician, Butch Penny Plyler -Please start checking your blood sugar every morning -Today we are going to check your cholesterol and have completed your foot exam  Please be sure to bring all of your medications with you to every visit.  Should you have any new or worsening symptoms, please be sure to call the clinic at [phone].  Progress Toward Treatment Goals:  Treatment Goal 08/23/2013  Hemoglobin A1C improved  Blood pressure at goal    Self Care Goals & Plans:  Self Care Goal 08/23/2013  Manage my medications take my medicines as prescribed  Monitor my health keep track of my blood glucose; bring my glucose meter and log to each visit  Eat healthy foods -  Be physically active -  Meeting treatment goals maintain the current self-care plan    Home Blood Glucose Monitoring 08/23/2013  Check my blood sugar once a day  When to check my blood sugar before breakfast     Care Management & Community Referrals:  Referral 08/23/2013  Referrals made for care management support diabetes educator

## 2013-08-23 NOTE — Assessment & Plan Note (Addendum)
Lab Results  Component Value Date   HGBA1C 8.6 08/23/2013   HGBA1C 9.1 05/24/2013   HGBA1C 7.5 11/22/2012     Assessment: Diabetes control: fair control Progress toward A1C goal:  improved Comments: Still not checking CBGs (problems with getting lancets and strips)  Plan: Medications:  continue current medications - glipizide & sitagliptin; hesitant to escalate or change regimen while he is not checking CBGs; we resent strips/lancets to her pharmacy today and asked him to check AM CBGs (given questionable hypoglycemia in the morning) for the next month and return to clinic with these readings to determine next stop.  Instructed to call clinic if they were unable to get supplies by Monday 08/27/13. Home glucose monitoring: Frequency: once a day Timing: before breakfast Instruction/counseling given: reminded to bring blood glucose meter & log to each visit and reminded to bring medications to each visit -Foot exam and lipid panel today -Patient to schedule eye exam -Referral to [facility]  ADDENDUM (9:28A, 08/24/13) - LDL not calc d/t elevated TG, direct LDL added on

## 2013-08-24 LAB — LDL CHOLESTEROL, DIRECT: Direct LDL: 111 mg/dL — ABNORMAL HIGH

## 2013-08-24 NOTE — Progress Notes (Signed)
Case discussed with Dr. Sharda soon after the resident saw the patient.  We reviewed the resident's history and exam and pertinent patient test results.  I agree with the assessment, diagnosis, and plan of care documented in the resident's note. 

## 2013-08-24 NOTE — Addendum Note (Signed)
Addended by: Othella Boyer on: 08/24/2013 09:28 AM   Modules accepted: Orders

## 2013-08-29 DIAGNOSIS — E785 Hyperlipidemia, unspecified: Secondary | ICD-10-CM | POA: Insufficient documentation

## 2013-08-29 NOTE — Assessment & Plan Note (Signed)
Patient's TG were 457, so direct LDL added on, LDL = 111; patient would benefit from statin therapy given h/o DM and CKD (studies have suggested anti-inflammatory benefit from statins).  I called to discuss this option with patient, but no answer.  To be discussed at next office visit.

## 2013-09-12 DIAGNOSIS — N186 End stage renal disease: Secondary | ICD-10-CM | POA: Diagnosis not present

## 2013-09-12 DIAGNOSIS — E119 Type 2 diabetes mellitus without complications: Secondary | ICD-10-CM | POA: Diagnosis not present

## 2013-09-15 DIAGNOSIS — N186 End stage renal disease: Secondary | ICD-10-CM | POA: Diagnosis not present

## 2013-09-17 DIAGNOSIS — N186 End stage renal disease: Secondary | ICD-10-CM | POA: Diagnosis not present

## 2013-09-17 DIAGNOSIS — D509 Iron deficiency anemia, unspecified: Secondary | ICD-10-CM | POA: Diagnosis not present

## 2013-09-25 ENCOUNTER — Encounter: Payer: Self-pay | Admitting: Dietician

## 2013-09-25 ENCOUNTER — Encounter: Payer: Self-pay | Admitting: Internal Medicine

## 2013-09-25 ENCOUNTER — Ambulatory Visit: Payer: Medicare Other | Admitting: Internal Medicine

## 2013-09-25 ENCOUNTER — Ambulatory Visit (INDEPENDENT_AMBULATORY_CARE_PROVIDER_SITE_OTHER): Payer: Medicare Other | Admitting: Dietician

## 2013-09-25 ENCOUNTER — Other Ambulatory Visit: Payer: Self-pay | Admitting: Dietician

## 2013-09-25 ENCOUNTER — Ambulatory Visit (INDEPENDENT_AMBULATORY_CARE_PROVIDER_SITE_OTHER): Payer: Medicare Other | Admitting: Internal Medicine

## 2013-09-25 ENCOUNTER — Other Ambulatory Visit: Payer: Self-pay | Admitting: Internal Medicine

## 2013-09-25 VITALS — BP 119/75 | HR 83 | Temp 96.8°F | Ht 65.0 in | Wt 166.8 lb

## 2013-09-25 DIAGNOSIS — IMO0002 Reserved for concepts with insufficient information to code with codable children: Secondary | ICD-10-CM

## 2013-09-25 DIAGNOSIS — E1165 Type 2 diabetes mellitus with hyperglycemia: Secondary | ICD-10-CM

## 2013-09-25 DIAGNOSIS — E785 Hyperlipidemia, unspecified: Secondary | ICD-10-CM

## 2013-09-25 DIAGNOSIS — I420 Dilated cardiomyopathy: Secondary | ICD-10-CM

## 2013-09-25 DIAGNOSIS — I120 Hypertensive chronic kidney disease with stage 5 chronic kidney disease or end stage renal disease: Secondary | ICD-10-CM | POA: Diagnosis not present

## 2013-09-25 DIAGNOSIS — N186 End stage renal disease: Secondary | ICD-10-CM

## 2013-09-25 DIAGNOSIS — I428 Other cardiomyopathies: Secondary | ICD-10-CM | POA: Diagnosis not present

## 2013-09-25 DIAGNOSIS — E11359 Type 2 diabetes mellitus with proliferative diabetic retinopathy without macular edema: Secondary | ICD-10-CM | POA: Diagnosis not present

## 2013-09-25 DIAGNOSIS — E118 Type 2 diabetes mellitus with unspecified complications: Principal | ICD-10-CM

## 2013-09-25 DIAGNOSIS — I10 Essential (primary) hypertension: Secondary | ICD-10-CM

## 2013-09-25 LAB — GLUCOSE, CAPILLARY: Glucose-Capillary: 141 mg/dL — ABNORMAL HIGH (ref 70–99)

## 2013-09-25 MED ORDER — LANCETS 30G MISC: Status: DC

## 2013-09-25 MED ORDER — LOVASTATIN 20 MG PO TABS: 20.0000 mg | ORAL_TABLET | Freq: Every day | ORAL | Status: DC

## 2013-09-25 NOTE — Progress Notes (Signed)
Medical Nutrition Therapy:  Appt start time: 1030 end time:  1130.  Assessment:  Primary concerns today: Blood sugar control and Meal planning.  Usual eating pattern includes 2-3 meals and 1-2 snacks per day. Patient and significant other describe healthy intake, sometimes missing lunch when he is tired from dialysis or busy.  Frequent foods include baked broiled grilled meats, fruits, vegetables, peanut butter crackers, juice, beans, bread. They report a low salt, low potassium and low phosphorus diet.  Avoided foods include milk and most dairy.    Usual physical activity includes activities of daily living, no formal exercise routine.  Progress Towards Goal(s):  In progress.   Nutritional Diagnosis:  NB-1.4 Self-monitoring deficit As related to lack of testing supplies and understanding of how to use the information to guide self care As evidenced by their report.    Intervention:  Nutrition education about renal and diabetes meal planning and self monitoring to assist with improved blood sugar control.   Monitoring/Evaluation:  Dietary intake, exercise, meter and log given today, and body weight in 3 week(s).

## 2013-09-25 NOTE — Assessment & Plan Note (Signed)
Patient amenable to starting statin therapy.  Sent rx for lovastatin 20 mg daily.  Explained possible side effects of statins, patient understands to stop medication and call [facility] if he experiences these.

## 2013-09-25 NOTE — Patient Instructions (Addendum)
Follow-up with Dr. Owens Shark in 1 month.   Please continue taking all of your medicines as prescribed.  We sent in prescription for cholesterol medicine.  Let us know if you experience any muscle aches.   Please continue checking your blood sugars every morning before breakfast AS WELL AS when you feel that you have low blood sugar.  It will be helpful for [place] to know what your blood sugars are when you feel that they are low.   Please also try to get appointments with your ophthalmologist and your cardiologist Dr. Stanford Breed as soon as possible.   The fax number here is [phone].  You may have the form for diabetic shoes faxed here attention: Dr. Owens Shark.   Diabetes Meal Planning Guide The diabetes meal planning guide is a tool to help you plan your meals and snacks. It is important for people with diabetes to manage their blood glucose (sugar) levels. Choosing the right foods and the right amounts throughout your day will help control your blood glucose. Eating right can even help you improve your blood pressure and reach or maintain a healthy weight. CARBOHYDRATE COUNTING MADE EASY When you eat carbohydrates, they turn to sugar. This raises your blood glucose level. Counting carbohydrates can help you control this level so you feel better. When you plan your meals by counting carbohydrates, you can have more flexibility in what you eat and balance your medicine with your food intake. Carbohydrate counting simply means adding up the total amount of carbohydrate grams in your meals and snacks. Try to eat about the same amount at each meal. Foods with carbohydrates are listed below. Each portion below is 1 carbohydrate serving or 15 grams of carbohydrates. Ask your dietician how many grams of carbohydrates you should eat at each meal or snack. Grains and Starches  1 slice bread.   English muffin or hotdog/hamburger bun.   cup cold cereal (unsweetened).   cup cooked pasta or rice.   cup starchy  vegetables (corn, potatoes, peas, beans, winter squash).  1 tortilla (6 inches).   bagel.  1 waffle or pancake (size of a CD).   cup cooked cereal.  4 to 6 small crackers. *Whole grain is recommended. Fruit  1 cup fresh unsweetened berries, melon, papaya, pineapple.  1 small fresh fruit.   banana or mango.   cup fruit juice (4 oz unsweetened).   cup canned fruit in natural juice or water.  2 tbs dried fruit.  12 to 15 grapes or cherries. Milk and Yogurt  1 cup fat-free or 1% milk.  1 cup soy milk.  6 oz light yogurt with sugar-free sweetener.  6 oz low-fat soy yogurt.  6 oz plain yogurt. Vegetables  1 cup raw or  cup cooked is counted as 0 carbohydrates or a "free" food.  If you eat 3 or more servings at 1 meal, count them as 1 carbohydrate serving. Other Carbohydrates   oz chips or pretzels.   cup ice cream or frozen yogurt.   cup sherbet or sorbet.  2 inch square cake, no frosting.  1 tbs honey, sugar, jam, jelly, or syrup.  2 small cookies.  3 squares of graham crackers.  3 cups popcorn.  6 crackers.  1 cup broth-based soup.  Count 1 cup casserole or other mixed foods as 2 carbohydrate servings.  Foods with less than 20 calories in a serving may be counted as 0 carbohydrates or a "free" food. You may want to purchase a book or  computer software that lists the carbohydrate gram counts of different foods. In addition, the nutrition facts panel on the labels of the foods you eat are a good source of this information. The label will tell you how big the serving size is and the total number of carbohydrate grams you will be eating per serving. Divide this number by 15 to obtain the number of carbohydrate servings in a portion. Remember, 1 carbohydrate serving equals 15 grams of carbohydrate. SERVING SIZES Measuring foods and serving sizes helps you make sure you are getting the right amount of food. The list below tells how big or small  some common serving sizes are.  1 oz.........4 stacked dice.  3 oz........Marland KitchenDeck of cards.  1 tsp.......Marland KitchenTip of little finger.  1 tbs......Marland KitchenMarland KitchenThumb.  2 tbs.......Marland KitchenGolf ball.   cup......Marland KitchenHalf of a fist.  1 cup.......Marland KitchenA fist. SAMPLE DIABETES MEAL PLAN Below is a sample meal plan that includes foods from the grain and starches, dairy, vegetable, fruit, and meat groups. A dietician can individualize a meal plan to fit your calorie needs and tell you the number of servings needed from each food group. However, controlling the total amount of carbohydrates in your meal or snack is more important than making sure you include all of the food groups at every meal. You may interchange carbohydrate containing foods (dairy, starches, and fruits). The meal plan below is an example of a 2000 calorie diet using carbohydrate counting. This meal plan has 17 carbohydrate servings. Breakfast  1 cup oatmeal (2 carb servings).   cup light yogurt (1 carb serving).  1 cup blueberries (1 carb serving).   cup almonds. Snack  1 large apple (2 carb servings).  1 low-fat string cheese stick. Lunch  Chicken breast salad.  1 cup spinach.   cup chopped tomatoes.  2 oz chicken breast, sliced.  2 tbs low-fat [place] dressing.  12 whole-wheat crackers (2 carb servings).  12 to 15 grapes (1 carb serving).  1 cup low-fat milk (1 carb serving). Snack  1 cup carrots.   cup hummus (1 carb serving). Dinner  3 oz broiled salmon.  1 cup brown rice (3 carb servings). Snack  1  cups steamed broccoli (1 carb serving) drizzled with 1 tsp olive oil and lemon juice.  1 cup light pudding (2 carb servings). DIABETES MEAL PLANNING WORKSHEET Your dietician can use this worksheet to help you decide how many servings of foods and what types of foods are right for you.  BREAKFAST Food Group and Servings / Carb Servings Grain/Starches __________________________________ Dairy  __________________________________________ Vegetable ______________________________________ Fruit ___________________________________________ Meat __________________________________________ Fat ____________________________________________ LUNCH Food Group and Servings / Carb Servings Grain/Starches ___________________________________ Dairy ___________________________________________ Fruit ____________________________________________ Meat ___________________________________________ Fat _____________________________________________ [name] Food Group and Servings / Carb Servings Grain/Starches ___________________________________ Dairy ___________________________________________ Fruit ____________________________________________ Meat ___________________________________________ Fat _____________________________________________ SNACKS Food Group and Servings / Carb Servings Grain/Starches ___________________________________ Dairy ___________________________________________ Vegetable _______________________________________ Fruit ____________________________________________ Meat ___________________________________________ Fat _____________________________________________ DAILY TOTALS Starches _________________________ Vegetable ________________________ Fruit ____________________________ Dairy ____________________________ Meat ____________________________ Fat ______________________________ Document Released: 04/29/2005 Document Revised: 10/25/2011 Document Reviewed: 03/10/2009 [employer] Patient Information 2014 [employer].

## 2013-09-25 NOTE — Assessment & Plan Note (Signed)
Patient states he will call to schedule follow-up with Dr. Stanford Breed this week.

## 2013-09-25 NOTE — Addendum Note (Signed)
Addended by: Ebbie Latus on: 09/25/2013 11:00 AM   Modules accepted: Orders

## 2013-09-25 NOTE — Assessment & Plan Note (Signed)
BP Readings from Last 3 Encounters:  09/25/13 119/75  08/23/13 103/70  05/24/13 121/75    Lab Results  Component Value Date   NA 142 09/12/2012   K 5.7* 12/22/2012   CREATININE 11.10* 07/11/2012    Assessment: Blood pressure control:  controlled Progress toward BP goal:   at goal  Comments: Patient gets HD MWF  Plan: Medications:  continue current medications Other plans: Continue to monitor.

## 2013-09-25 NOTE — Telephone Encounter (Signed)
Strips are 4.19$ per pharmacy and they need a prescription for lancets

## 2013-09-25 NOTE — Assessment & Plan Note (Addendum)
A1C 8.6% last month.  Patient only checked AM blood glucoses for first week after last appointment, which were in high 100s-low 200s (see HPI), due to difficulty with insurance paying for supplies (now resolved).  He and his wife believe he is having hypoglycemic episodes during night as he wakes up feeling nauseous and "talking out of his head," symptoms seem to resolve with juice and a snack. This could represent Semogyi effect but unclear until we have BGs during these episodes.  Of note, patient not managed on insulin despite ESRD (2/2 DM2?) on HD.   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

## 2013-09-25 NOTE — Progress Notes (Addendum)
Patient ID: [name], male   DOB: 22-Sep-1970, 43 y.o.   MRN: [id number]   Subjective:   Patient ID: [name] male   DOB: September 26, 1970 43 y.o.   MRN: [id number]  HPI: [name] is a 43 y.o. man with history of DM2 (A1C 8.6% on 08/23/13), HTN, HL, sCHF, ESRD on HD MWF who presents for diabetes follow-up.   Patient reports good medication compliance.  He brought his glucometer with him today but states he was unable to obtain testing strips other than what came with glucometer due to issues with insurance covering them; he will finally be able to pick them up today though.  He checked his blood sugars before breakfast for one week starting on 1/10 (with strips that came with glucometer), values: 193, 184, 173, 166, 199, 203, 216, 127.  He and his wife report episodes of perceived low blood sugar in the middle of the night, usually 2-3am during which patient feels nauseous with stomach growling and "talks out of his head."  His wife gives him juice and a snack then he goes back to bed.  Above readings were all from around 5am.  Denies symptoms of hyperglycemia.   Patient missed his ophthalmology appointment last week as it was scheduled during dialysis session.  He has also not yet called Dr. Stanford Breed to set up follow-up appointment.    Past Medical History  Diagnosis Date  . Non-ischemic cardiomyopathy     EF 20-25% 12/23/2008>>EF 40-45% 03/2009>>EF ??? 7/?/2011  . ESRD (end stage renal disease) on dialysis     1.Initiated HD via right per cate 12/30/2008 (MWF schedule at [facility]) 2.Left upper extremity A-V fistula by Dr Oneida Alar 12/30/2008 3.Aemia: Received Venofer 12/30/08 Ferritin 179 % sat 12 12/23/08, 4. Secondary Hyperparathyroidism- PTH 188, P 5.4, Ca 9.2 12/2008  . Diabetes mellitus 12/2008    A1C 8.3 12/23/08, 24hr urine protein 2793 mg/day, SPEP neg, proliferative BDR s/p photocoagulation 08/2009 done at [facility] and f/u by DR Groat  . Hypertension     resloved after initating  HD, now with post HD hypotension  . Hemodialysis-associated hypotension     coreg d/c'd  . Anemia   . CHF (congestive heart failure)   . Complication of anesthesia   . PONV (postoperative nausea and vomiting)   . History of blood transfusion   . Psoriasis     scalp   Current Outpatient Prescriptions  Medication Sig Dispense Refill  . aspirin EC 81 MG tablet Take 81 mg by mouth daily.      . B Complex-C-Folic Acid (RENA-VITE PO) Take 1 tablet by mouth daily.      . Blood Glucose Monitoring Suppl (FREESTYLE FREEDOM LITE) W/DEVICE KIT Check blood sugar once daily as instructed dx code 250.00  1 each  0  . calcium carbonate (OS-CAL - DOSED IN MG OF ELEMENTAL CALCIUM) 1250 MG tablet Take 1 tablet by mouth 3 (three) times daily with meals.      Marland Kitchen glipiZIDE (GLUCOTROL XL) 10 MG 24 hr tablet Take 1 tablet (10 mg total) by mouth daily.  30 tablet  11  . glucose blood (FREESTYLE LITE) test strip Check blood sugar once daily as instructed dx code 250.00.  100 each  5  . Lancets 30G MISC Use to check blood sugar once daily as instructed dx code 250.00  100 each  5  . sitaGLIPtin (JANUVIA) 25 MG tablet Take 1 tablet (25 mg total) by mouth daily.  30 tablet  11  .  triamcinolone (KENALOG) 0.025 % ointment Apply topically 2 (two) times daily. Apply to nose and beard, in areas of dry skin  80 g  1  . carvedilol (COREG CR) 10 MG 24 hr capsule Take 10 mg by mouth daily.      Marland Kitchen docusate sodium (DULCOLAX) 100 MG capsule Take 100 mg by mouth daily as needed. For constipation      . ketoconazole (NIZORAL) 2 % shampoo Apply topically 2 (two) times a week. Wash hair and beard.  Leave shampoo on skin for 5 mins before washing off.  120 mL  1  . lovastatin (MEVACOR) 20 MG tablet Take 1 tablet (20 mg total) by mouth daily.  30 tablet  11  . promethazine (PHENERGAN) 25 MG tablet Take 25 mg by mouth every 8 (eight) hours as needed. For nausea/vomiting       No current facility-administered medications for this visit.     Family History  Problem Relation Age of Onset  . Hypertension Mother   . Diabetes Father   . Kidney disease Father   . Other Father     amputation  . Coronary artery disease Neg Hx     no hx of CHF, sudden cardiac death, or CAD  . Hypertension Brother   . Other Brother     bleeding problems   History   Social History  . Marital Status: Married    Spouse Name: N/A    Number of Children: N/A  . Years of Education: N/A   Social History Main Topics  . Smoking status: Never Smoker   . Smokeless tobacco: Never Used  . Alcohol Use: No  . Drug Use: No  . Sexual Activity: None   Other Topics Concern  . None   Social History Narrative   Has two children. No risky sexual behavior.   Review of Systems: Review of Systems  Constitutional: Negative for fever and chills.  Eyes: Negative for blurred vision.  Respiratory: Negative for cough and shortness of breath.   Cardiovascular: Negative for chest pain, palpitations and leg swelling.  Gastrointestinal: Negative for nausea, vomiting, abdominal pain, diarrhea, constipation and blood in stool.  Genitourinary: Negative for dysuria.  Musculoskeletal: Negative for falls.  Neurological: Negative for dizziness, loss of consciousness, weakness and headaches.    Objective:  Physical Exam: Filed Vitals:   09/25/13 0955  BP: 119/75  Pulse: 83  Temp: 96.8 F (36 C)  TempSrc: Oral  Height: 5' 5"  (1.651 m)  Weight: 166 lb 12.8 oz (75.66 kg)  SpO2: 99%    General: alert, cooperative, and in no apparent distress HEENT: NCAT, vision grossly intact, oropharynx clear and non-erythematous  Neck: supple, no lymphadenopathy Lungs: clear to ascultation bilaterally, normal work of respiration, no wheezes, rales, ronchi Heart: regular rate and rhythm, no murmurs, gallops, or rubs Abdomen: soft, non-tender, non-distended, normal bowel sounds Extremities: left forearm graft with palpable thrill; 2+ DP/PT pulses bilaterally, no cyanosis,  clubbing, or edema Neurologic: alert & oriented X3, cranial nerves II-XII intact, strength grossly intact, sensation intact to light touch  Assessment & Plan:  Patient discussed with Dr. Gwynne Edinger. Please see problem-based assessment and plan.

## 2013-09-25 NOTE — Assessment & Plan Note (Signed)
Patient to call and reschedule ophthalmology appointment that he missed last week.

## 2013-09-26 NOTE — Patient Instructions (Signed)
Please make a follow up appointment for 3-4 weeks.    Record blood sugars as discussed and bring sheet back with you  Eating about the same amount of carbs at meals and snacks should help your blood sugars. About 4 carb choices at each meal and 1 for snacks may work for you   Examples:  2 starch + 1 fruit + 1 dairy = 4 carb choices    1 starch + 2 dairy + 1 fruit = 4 carb choices    2 starch + 2 milk = 4 carb choices    1 starch + 1 milk + 2 fruit = 4 carb choices    2 starch + 2 fruit = 4 carb choices    4 starches = 4 carb choices  Veggies, healthy fats( black olives)  and meats do not count- add them as needed.

## 2013-09-27 ENCOUNTER — Other Ambulatory Visit: Payer: Self-pay | Admitting: *Deleted

## 2013-09-27 DIAGNOSIS — E1165 Type 2 diabetes mellitus with hyperglycemia: Secondary | ICD-10-CM

## 2013-09-27 DIAGNOSIS — IMO0002 Reserved for concepts with insufficient information to code with codable children: Secondary | ICD-10-CM

## 2013-09-27 DIAGNOSIS — E118 Type 2 diabetes mellitus with unspecified complications: Principal | ICD-10-CM

## 2013-09-27 MED ORDER — LANCETS 30G MISC: Status: DC

## 2013-09-27 NOTE — Telephone Encounter (Signed)
Pt's insurance will not accept dr Lilia Pro rogers dea not npi, please resend electronically

## 2013-09-28 NOTE — Progress Notes (Signed)
Case discussed with Dr. Laney Bagshaw at the time of the visit.  We reviewed the resident's history and exam and pertinent patient test results.  I agree with the assessment, diagnosis, and plan of care documented in the resident's note. 

## 2013-10-13 DIAGNOSIS — N186 End stage renal disease: Secondary | ICD-10-CM | POA: Diagnosis not present

## 2013-10-15 DIAGNOSIS — N186 End stage renal disease: Secondary | ICD-10-CM | POA: Diagnosis not present

## 2013-10-16 ENCOUNTER — Encounter: Payer: Self-pay | Admitting: Internal Medicine

## 2013-10-16 ENCOUNTER — Ambulatory Visit (INDEPENDENT_AMBULATORY_CARE_PROVIDER_SITE_OTHER): Payer: Medicare Other | Admitting: Internal Medicine

## 2013-10-16 VITALS — BP 112/70 | HR 92 | Temp 98.0°F | Ht 65.0 in | Wt 163.2 lb

## 2013-10-16 DIAGNOSIS — I120 Hypertensive chronic kidney disease with stage 5 chronic kidney disease or end stage renal disease: Secondary | ICD-10-CM | POA: Diagnosis not present

## 2013-10-16 DIAGNOSIS — K044 Acute apical periodontitis of pulpal origin: Secondary | ICD-10-CM

## 2013-10-16 DIAGNOSIS — E785 Hyperlipidemia, unspecified: Secondary | ICD-10-CM | POA: Diagnosis not present

## 2013-10-16 DIAGNOSIS — E1165 Type 2 diabetes mellitus with hyperglycemia: Secondary | ICD-10-CM | POA: Diagnosis not present

## 2013-10-16 DIAGNOSIS — N186 End stage renal disease: Secondary | ICD-10-CM | POA: Diagnosis not present

## 2013-10-16 DIAGNOSIS — E11359 Type 2 diabetes mellitus with proliferative diabetic retinopathy without macular edema: Secondary | ICD-10-CM | POA: Diagnosis not present

## 2013-10-16 DIAGNOSIS — E1129 Type 2 diabetes mellitus with other diabetic kidney complication: Secondary | ICD-10-CM

## 2013-10-16 DIAGNOSIS — IMO0002 Reserved for concepts with insufficient information to code with codable children: Secondary | ICD-10-CM | POA: Diagnosis not present

## 2013-10-16 DIAGNOSIS — K047 Periapical abscess without sinus: Secondary | ICD-10-CM

## 2013-10-16 DIAGNOSIS — I428 Other cardiomyopathies: Secondary | ICD-10-CM | POA: Diagnosis not present

## 2013-10-16 LAB — GLUCOSE, CAPILLARY: GLUCOSE-CAPILLARY: 153 mg/dL — AB (ref 70–99)

## 2013-10-16 MED ORDER — AMOXICILLIN-POT CLAVULANATE 500-125 MG PO TABS: 1.0000 | ORAL_TABLET | Freq: Every day | ORAL | Status: DC

## 2013-10-16 MED ORDER — HYDROCODONE-ACETAMINOPHEN 5-325 MG PO TABS: 1.0000 | ORAL_TABLET | Freq: Three times a day (TID) | ORAL | Status: DC | PRN

## 2013-10-16 NOTE — Assessment & Plan Note (Addendum)
Will refer the patient for dentist evaluation.  Patient does report subjective fever and chills and is higher risk given ESRD.  Will give patient course of Augmentin renally dosed (10 day course).  Patient instructed to call clinic for worsening symptoms or if unable to see dentist within the next 10 days.  He met with Bonna Gains at this visit and if he fills out paperwork he may be able to be seen at no cost at health department.  He also reports he will ask his dialysis center if they have a preferred dentist. Given limited prescription for Norco 5-325 for pain.

## 2013-10-16 NOTE — Progress Notes (Signed)
Case discussed with Dr. Hoffman at the time of the visit.  We reviewed the resident's history and exam and pertinent patient test results.  I agree with the assessment, diagnosis, and plan of care documented in the resident's note. 

## 2013-10-16 NOTE — Progress Notes (Signed)
[facility]  Subjective:   Patient ID: [name] male   DOB: 07-15-1971 43 y.o.   MRN: [id number]  HPI: AlexisSolan Morgan is a 43 y.o. male with a PMH of ESRD, T2DM, HTN, Cardiomyopathy, HLD.  He presents today for an acute visit for dental pain.  He reports that pain started in the bottom right of his mouth 3 weeks ago and has gotten worse.  He reports that he has been using Anbesol and aleve with some success at treating the pain but it has become too painful to use his bottom dentures.  In addition he admits to some subjective fever and chills.  His last visit to the dentist was 8 years ago.  He does not have a regular dentist or dental insurance.  Past Medical History  Diagnosis Date  . Non-ischemic cardiomyopathy     EF 20-25% 12/23/2008>>EF 40-45% 03/2009>>EF ??? 7/?/2011  . ESRD (end stage renal disease) on dialysis     1.Initiated HD via right per cate 12/30/2008 (MWF schedule at [facility]) 2.Left upper extremity A-V fistula by Dr Oneida Alar 12/30/2008 3.Aemia: Received Venofer 12/30/08 Ferritin 179 % sat 12 12/23/08, 4. Secondary Hyperparathyroidism- PTH 188, P 5.4, Ca 9.2 12/2008  . Diabetes mellitus 12/2008    A1C 8.3 12/23/08, 24hr urine protein 2793 mg/day, SPEP neg, proliferative BDR s/p photocoagulation 08/2009 done at [facility] and f/u by DR Groat  . Hypertension     resloved after initating HD, now with post HD hypotension  . Hemodialysis-associated hypotension     coreg d/c'd  . Anemia   . CHF (congestive heart failure)   . Complication of anesthesia   . PONV (postoperative nausea and vomiting)   . History of blood transfusion   . Psoriasis     scalp   Current Outpatient Prescriptions  Medication Sig Dispense Refill  . amoxicillin-clavulanate (AUGMENTIN) 500-125 MG per tablet Take 1 tablet (500 mg total) by mouth daily. Take after dialysis on HD days.  10 tablet  0  . aspirin EC 81 MG tablet Take 81 mg by mouth daily.      . B Complex-C-Folic Acid (RENA-VITE  PO) Take 1 tablet by mouth daily.      . Blood Glucose Monitoring Suppl (FREESTYLE FREEDOM LITE) W/DEVICE KIT Check blood sugar once daily as instructed dx code 250.00  1 each  0  . calcium carbonate (OS-CAL - DOSED IN MG OF ELEMENTAL CALCIUM) 1250 MG tablet Take 1 tablet by mouth 3 (three) times daily with meals.      . carvedilol (COREG CR) 10 MG 24 hr capsule Take 10 mg by mouth daily.      Marland Kitchen docusate sodium (DULCOLAX) 100 MG capsule Take 100 mg by mouth daily as needed. For constipation      . glipiZIDE (GLUCOTROL XL) 10 MG 24 hr tablet Take 1 tablet (10 mg total) by mouth daily.  30 tablet  11  . glucose blood (FREESTYLE LITE) test strip Check blood sugar once daily as instructed dx code 250.00.  100 each  5  . HYDROcodone-acetaminophen (NORCO/VICODIN) 5-325 MG per tablet Take 1 tablet by mouth every 8 (eight) hours as needed for moderate pain.  20 tablet  0  . ketoconazole (NIZORAL) 2 % shampoo Apply topically 2 (two) times a week. Wash hair and beard.  Leave shampoo on skin for 5 mins before washing off.  120 mL  1  . Lancets 30G MISC Use to check blood sugar once daily as instructed  dx code 250.00. Non-insulin dependent  100 each  5  . lovastatin (MEVACOR) 20 MG tablet Take 1 tablet (20 mg total) by mouth daily.  30 tablet  11  . promethazine (PHENERGAN) 25 MG tablet Take 25 mg by mouth every 8 (eight) hours as needed. For nausea/vomiting      . sitaGLIPtin (JANUVIA) 25 MG tablet Take 1 tablet (25 mg total) by mouth daily.  30 tablet  11  . triamcinolone (KENALOG) 0.025 % ointment Apply topically 2 (two) times daily. Apply to nose and beard, in areas of dry skin  80 g  1   No current facility-administered medications for this visit.   Family History  Problem Relation Age of Onset  . Hypertension Mother   . Diabetes Father   . Kidney disease Father   . Other Father     amputation  . Coronary artery disease Neg Hx     no hx of CHF, sudden cardiac death, or CAD  . Hypertension Brother    . Other Brother     bleeding problems   History   Social History  . Marital Status: Married    Spouse Name: N/A    Number of Children: N/A  . Years of Education: N/A   Social History Main Topics  . Smoking status: Never Smoker   . Smokeless tobacco: Never Used  . Alcohol Use: No  . Drug Use: No  . Sexual Activity: None   Other Topics Concern  . None   Social History Narrative   Has two children. No risky sexual behavior.   Review of Systems: Review of Systems  Constitutional: Positive for fever and chills. Negative for malaise/fatigue.  HENT: Negative for congestion, ear pain, hearing loss and sore throat.   Eyes: Negative for blurred vision.  Respiratory: Negative for cough, sputum production and shortness of breath.   Cardiovascular: Negative for chest pain and leg swelling.  Gastrointestinal: Negative for heartburn, abdominal pain, diarrhea and constipation.  Genitourinary: Negative for dysuria.  Musculoskeletal: Negative for myalgias.  Neurological: Negative for dizziness, weakness and headaches.    Objective:  Physical Exam: Filed Vitals:   10/16/13 0935  BP: 112/70  Pulse: 92  Temp: 98 F (36.7 C)  TempSrc: Oral  Height: 5' 5"  (1.651 m)  Weight: 163 lb 3.2 oz (74.027 kg)  SpO2: 96%  Physical Exam  Nursing note and vitals reviewed. Constitutional: He is well-developed, well-nourished, and in no distress. No distress.  HENT:  Head: Normocephalic and atraumatic.  Mouth/Throat: Dental caries present.    Eyes: EOM are normal.  Cardiovascular: Normal rate and regular rhythm.   No murmur heard. Pulmonary/Chest: Effort normal and breath sounds normal. No respiratory distress. He has no wheezes.  Abdominal: Soft. Bowel sounds are normal. He exhibits no distension. There is no tenderness.  Musculoskeletal: He exhibits no edema.  Skin: Skin is warm and dry. He is not diaphoretic.    Assessment & Plan:   See Problem Based Assessment and Plan Meds ordered  this encounter  Medications  . amoxicillin-clavulanate (AUGMENTIN) 500-125 MG per tablet    Sig: Take 1 tablet (500 mg total) by mouth daily. Take after dialysis on HD days.    Dispense:  10 tablet    Refill:  0  . HYDROcodone-acetaminophen (NORCO/VICODIN) 5-325 MG per tablet    Sig: Take 1 tablet by mouth every 8 (eight) hours as needed for moderate pain.    Dispense:  20 tablet    Refill:  0  Orders Placed This Encounter  Procedures  . Glucose, capillary

## 2013-10-16 NOTE — Patient Instructions (Signed)
Please fill out the paper work [employer] gave you, please speak to your Nephrologist at HD to ask if they have a dentist they can get you in with sooner. Take Augmentin 500-125mg  1 tablet daily (after HD on dialysis days) Call our office if unable to schedule dental appointment.

## 2013-10-23 ENCOUNTER — Encounter: Payer: Self-pay | Admitting: Internal Medicine

## 2013-10-23 ENCOUNTER — Ambulatory Visit (INDEPENDENT_AMBULATORY_CARE_PROVIDER_SITE_OTHER): Payer: Medicare Other | Admitting: Internal Medicine

## 2013-10-23 ENCOUNTER — Ambulatory Visit (INDEPENDENT_AMBULATORY_CARE_PROVIDER_SITE_OTHER): Payer: Medicare Other | Admitting: Dietician

## 2013-10-23 VITALS — BP 116/77 | HR 72 | Temp 97.3°F | Ht 65.0 in

## 2013-10-23 DIAGNOSIS — I120 Hypertensive chronic kidney disease with stage 5 chronic kidney disease or end stage renal disease: Secondary | ICD-10-CM | POA: Diagnosis not present

## 2013-10-23 DIAGNOSIS — N186 End stage renal disease: Secondary | ICD-10-CM

## 2013-10-23 DIAGNOSIS — E118 Type 2 diabetes mellitus with unspecified complications: Secondary | ICD-10-CM

## 2013-10-23 DIAGNOSIS — IMO0002 Reserved for concepts with insufficient information to code with codable children: Secondary | ICD-10-CM

## 2013-10-23 DIAGNOSIS — E11359 Type 2 diabetes mellitus with proliferative diabetic retinopathy without macular edema: Secondary | ICD-10-CM | POA: Diagnosis not present

## 2013-10-23 DIAGNOSIS — K044 Acute apical periodontitis of pulpal origin: Secondary | ICD-10-CM

## 2013-10-23 DIAGNOSIS — I428 Other cardiomyopathies: Secondary | ICD-10-CM | POA: Diagnosis not present

## 2013-10-23 DIAGNOSIS — E785 Hyperlipidemia, unspecified: Secondary | ICD-10-CM | POA: Diagnosis not present

## 2013-10-23 DIAGNOSIS — E1165 Type 2 diabetes mellitus with hyperglycemia: Secondary | ICD-10-CM

## 2013-10-23 DIAGNOSIS — K047 Periapical abscess without sinus: Secondary | ICD-10-CM

## 2013-10-23 MED ORDER — LOVASTATIN 20 MG PO TABS: 20.0000 mg | ORAL_TABLET | Freq: Every day | ORAL | Status: DC

## 2013-10-23 NOTE — Patient Instructions (Signed)
Please make a follow up appointment in late April by calling [phone].   Your blood sugars are much better!  Your goal is to try regular oatmeal (can soak in Lactaid milk in the refrigerator the night before with cinnamon and raisins to make it cook quicker)  And keep making healthier choices- it is making a difference!

## 2013-10-23 NOTE — Assessment & Plan Note (Signed)
Discussed with patient the need to continue to inform nephrologist about any changes to his weight and possible need to adjust dry weight as he loses fat.

## 2013-10-23 NOTE — Patient Instructions (Signed)
You are doing great!  Keep up the good work with your diet and with exercise. Both of these things will help your sugars.  Come back in about 3 months with your regular doctor.   Call us if you have problems sooner at [phone].  Exercise to Stay Healthy Exercise helps you become and stay healthy. EXERCISE IDEAS AND TIPS Choose exercises that:  You enjoy.  Fit into your day. You do not need to exercise really hard to be healthy. You can do exercises at a slow or medium level and stay healthy. You can:  Stretch before and after working out.  Try yoga, Pilates, or tai chi.  Lift weights.  Walk fast, swim, jog, run, climb stairs, bicycle, dance, or rollerskate.  Take aerobic classes. Exercises that burn about 150 calories:  Running 1  miles in 15 minutes.  Playing volleyball for 45 to 60 minutes.  Washing and waxing a car for 45 to 60 minutes.  Playing touch football for 45 minutes.  Walking 1  miles in 35 minutes.  Pushing a stroller 1  miles in 30 minutes.  Playing basketball for 30 minutes.  Raking leaves for 30 minutes.  Bicycling 5 miles in 30 minutes.  Walking 2 miles in 30 minutes.  Dancing for 30 minutes.  Shoveling snow for 15 minutes.  Swimming laps for 20 minutes.  Walking up stairs for 15 minutes.  Bicycling 4 miles in 15 minutes.  Gardening for 30 to 45 minutes.  Jumping rope for 15 minutes.  Washing windows or floors for 45 to 60 minutes. Document Released: 09/04/2010 Document Revised: 10/25/2011 Document Reviewed: 09/04/2010 [employer] Patient Information 2014 [employer].  

## 2013-10-23 NOTE — Assessment & Plan Note (Signed)
Doing much better and able to tolerate his dentures again. Still with several doses left and encouraged to finish all doses.

## 2013-10-23 NOTE — Assessment & Plan Note (Signed)
HgA1c unreliable due to ESRD and sugar log for last month was controlled with dietary changes. Encouraged him to continue and come back to see diabetic educator as instructed to keep going with changes.

## 2013-10-23 NOTE — Progress Notes (Signed)
Subjective:     Patient ID: [name], male   DOB: 03-Apr-1971, 43 y.o.   MRN: [id number]  HPI The patient is a 43 YO man with type 2 diabetes, ESRD, HTN, hyperlipidemia who is coming in today for a routine follow up. He was seen last week for dental infection and started on antibiotics. He is feeling much better in regards to that. He has been working on his diet and starting to exercise again with the woman he lives with. She has lost about 10 pounds. He has not had fevers, chills, hypoglycemia. Most sugars have been in range since last visit with the dietary changes.   Review of Systems  Constitutional: Positive for activity change. Negative for fever, chills, appetite change, fatigue and unexpected weight change.       Exercising more  HENT: Positive for dental problem.   Respiratory: Negative for cough, chest tightness, shortness of breath and wheezing.   Cardiovascular: Negative for chest pain, palpitations and leg swelling.  Gastrointestinal: Negative for nausea, vomiting, abdominal pain, diarrhea and abdominal distention.  Neurological: Negative for syncope.       Objective:   Physical Exam  Constitutional: He is oriented to person, place, and time. He appears well-developed and well-nourished.  HENT:  Head: Normocephalic and atraumatic.  Neck: Normal range of motion. Neck supple. No JVD present. No tracheal deviation present. No thyromegaly present.  Cardiovascular: Normal rate and regular rhythm.   Flow murmur, vascular access with good flow and bruit  Pulmonary/Chest: Effort normal and breath sounds normal. No respiratory distress. He has no wheezes. He has no rales.  Abdominal: Soft. Bowel sounds are normal.  Lymphadenopathy:    He has no cervical adenopathy.  Neurological: He is alert and oriented to person, place, and time. No cranial nerve deficit.       Assessment/Plan:   1. Please see problem oriented charting.  2. Disposition - Sugars likely elevated secondary  to recent infection. Cannot use HgA1c reliably for level of control with HD. Sugar log reviewed and discussed with patient. He also had visit with diabetic educator. Much improved and discussed need to keep nephrologist informed about his weight loss and the possible need in the future to adjust his dry weights. Return in 3 months.

## 2013-10-23 NOTE — Progress Notes (Signed)
Medical Nutrition Therapy:  Appt start time: 930 end time:  1010.  Assessment:  Primary concerns today: Blood sugar control and Meal planning.  Patient is here with significant other. Meter was downloaded: average is improved last 30 day average < 150. Usual eating pattern continues with 2-3 meals and 1-2 snacks per day. Patient and significant other report they are making healthier choices since last visit. Eating out less often. Frequent foods include fruits, vegetables, peanut butter crackers, juice, he has cut back on bread. Labs reportedly good from dialysis except calcium is low.  Avoided foods include milk and yogurt, but patient is considering drinking some lactaid milk to increase his calcium.  Less regular soda: has a half pepsi one with himi today that eh started last night.   Usual physical activity includes activities of daily living and walking some now as well.  24 hours recall:  ~ 5 am 1 egg, instant oatmeal, decaf coffee or tea 12-2 Pm - sandwich with [employer] or [employer], [place] or chicken, juice or water 5-6 Pm- meat, starch and vegetable, water or juice- lately when out he gets salad with grilled chicken and fruit Snacks- peanut butter crackers or fruit like applesauce Labs noted with a!V 8.6%, Lipids elevated with total chol 206-HDL 30-LDL 111-triglycerides 457 Progress Towards Goal(s):  In progress.   Nutritional Diagnosis:  NB-1.4 Self-monitoring deficit As related to lack of testing supplies and understanding of how to use the information to guide self care has improved As evidenced by their meter and meter download.  NB 1.2 Food and Nutrition related knowledge deficit as related to need for renal  And diabetes and healthy fat meal planning to improve patient outcomes as evidenced by his 24 hour recall content of high salt, sugar and low nutrient density foods    Intervention:  Nutrition education about renal and diabetes meal planning and self monitoring to  assist with improved blood sugar control. His goal is to continue to walk, eat healthier foods and try regular oatmeal instead of instant ( suggested they soak it in lactaid milk the night before to make it quicker cooking on dialysis days)  Monitoring/Evaluation:  Dietary intake, exercise, and body weight in 2 month(s).

## 2013-10-24 NOTE — Progress Notes (Signed)
Case discussed with Dr. Doug Sou soon after the resident saw the patient.  We reviewed the resident's history and exam and pertinent patient test results.  I agree with the assessment, diagnosis and plan of care documented in the resident's note.

## 2013-11-13 DIAGNOSIS — N186 End stage renal disease: Secondary | ICD-10-CM | POA: Diagnosis not present

## 2013-11-14 DIAGNOSIS — E119 Type 2 diabetes mellitus without complications: Secondary | ICD-10-CM | POA: Diagnosis not present

## 2013-11-14 DIAGNOSIS — D509 Iron deficiency anemia, unspecified: Secondary | ICD-10-CM | POA: Diagnosis not present

## 2013-11-14 DIAGNOSIS — N186 End stage renal disease: Secondary | ICD-10-CM | POA: Diagnosis not present

## 2013-12-05 DIAGNOSIS — E119 Type 2 diabetes mellitus without complications: Secondary | ICD-10-CM | POA: Diagnosis not present

## 2013-12-13 DIAGNOSIS — N186 End stage renal disease: Secondary | ICD-10-CM | POA: Diagnosis not present

## 2013-12-14 DIAGNOSIS — N186 End stage renal disease: Secondary | ICD-10-CM | POA: Diagnosis not present

## 2013-12-14 HISTORY — PX: KIDNEY TRANSPLANT: SHX239

## 2013-12-17 DIAGNOSIS — N186 End stage renal disease: Secondary | ICD-10-CM | POA: Diagnosis not present

## 2013-12-19 DIAGNOSIS — N186 End stage renal disease: Secondary | ICD-10-CM | POA: Diagnosis not present

## 2013-12-21 DIAGNOSIS — N186 End stage renal disease: Secondary | ICD-10-CM | POA: Diagnosis not present

## 2013-12-24 DIAGNOSIS — N186 End stage renal disease: Secondary | ICD-10-CM | POA: Diagnosis not present

## 2013-12-26 DIAGNOSIS — D696 Thrombocytopenia, unspecified: Secondary | ICD-10-CM | POA: Diagnosis not present

## 2013-12-26 DIAGNOSIS — E872 Acidosis, unspecified: Secondary | ICD-10-CM | POA: Diagnosis not present

## 2013-12-26 DIAGNOSIS — Z905 Acquired absence of kidney: Secondary | ICD-10-CM | POA: Diagnosis not present

## 2013-12-26 DIAGNOSIS — Z8249 Family history of ischemic heart disease and other diseases of the circulatory system: Secondary | ICD-10-CM | POA: Diagnosis not present

## 2013-12-26 DIAGNOSIS — E119 Type 2 diabetes mellitus without complications: Secondary | ICD-10-CM | POA: Diagnosis not present

## 2013-12-26 DIAGNOSIS — N186 End stage renal disease: Secondary | ICD-10-CM | POA: Diagnosis present

## 2013-12-26 DIAGNOSIS — E1129 Type 2 diabetes mellitus with other diabetic kidney complication: Secondary | ICD-10-CM | POA: Diagnosis not present

## 2013-12-26 DIAGNOSIS — I509 Heart failure, unspecified: Secondary | ICD-10-CM | POA: Diagnosis present

## 2013-12-26 DIAGNOSIS — J9819 Other pulmonary collapse: Secondary | ICD-10-CM | POA: Diagnosis not present

## 2013-12-26 DIAGNOSIS — K560 Paralytic ileus: Secondary | ICD-10-CM | POA: Diagnosis not present

## 2013-12-26 DIAGNOSIS — E871 Hypo-osmolality and hyponatremia: Secondary | ICD-10-CM | POA: Diagnosis not present

## 2013-12-26 DIAGNOSIS — K929 Disease of digestive system, unspecified: Secondary | ICD-10-CM | POA: Diagnosis not present

## 2013-12-26 DIAGNOSIS — Z5181 Encounter for therapeutic drug level monitoring: Secondary | ICD-10-CM | POA: Diagnosis not present

## 2013-12-26 DIAGNOSIS — Z01818 Encounter for other preprocedural examination: Secondary | ICD-10-CM | POA: Diagnosis not present

## 2013-12-26 DIAGNOSIS — Z48298 Encounter for aftercare following other organ transplant: Secondary | ICD-10-CM | POA: Diagnosis not present

## 2013-12-26 DIAGNOSIS — I428 Other cardiomyopathies: Secondary | ICD-10-CM | POA: Diagnosis present

## 2013-12-26 DIAGNOSIS — Z940 Kidney transplant status: Secondary | ICD-10-CM | POA: Diagnosis not present

## 2013-12-26 DIAGNOSIS — E1149 Type 2 diabetes mellitus with other diabetic neurological complication: Secondary | ICD-10-CM | POA: Diagnosis present

## 2013-12-26 DIAGNOSIS — I120 Hypertensive chronic kidney disease with stage 5 chronic kidney disease or end stage renal disease: Secondary | ICD-10-CM | POA: Diagnosis present

## 2013-12-26 DIAGNOSIS — E11359 Type 2 diabetes mellitus with proliferative diabetic retinopathy without macular edema: Secondary | ICD-10-CM | POA: Diagnosis present

## 2013-12-26 DIAGNOSIS — D6859 Other primary thrombophilia: Secondary | ICD-10-CM | POA: Diagnosis present

## 2013-12-26 DIAGNOSIS — E876 Hypokalemia: Secondary | ICD-10-CM | POA: Diagnosis not present

## 2013-12-26 DIAGNOSIS — D62 Acute posthemorrhagic anemia: Secondary | ICD-10-CM | POA: Diagnosis not present

## 2013-12-26 DIAGNOSIS — E1142 Type 2 diabetes mellitus with diabetic polyneuropathy: Secondary | ICD-10-CM | POA: Diagnosis not present

## 2013-12-26 DIAGNOSIS — Z992 Dependence on renal dialysis: Secondary | ICD-10-CM | POA: Diagnosis not present

## 2013-12-26 DIAGNOSIS — R9431 Abnormal electrocardiogram [ECG] [EKG]: Secondary | ICD-10-CM | POA: Diagnosis not present

## 2013-12-26 DIAGNOSIS — Z79899 Other long term (current) drug therapy: Secondary | ICD-10-CM | POA: Diagnosis not present

## 2013-12-26 DIAGNOSIS — Z09 Encounter for follow-up examination after completed treatment for conditions other than malignant neoplasm: Secondary | ICD-10-CM | POA: Diagnosis not present

## 2013-12-26 DIAGNOSIS — E875 Hyperkalemia: Secondary | ICD-10-CM | POA: Diagnosis not present

## 2013-12-26 DIAGNOSIS — Z452 Encounter for adjustment and management of vascular access device: Secondary | ICD-10-CM | POA: Diagnosis not present

## 2013-12-26 DIAGNOSIS — E1139 Type 2 diabetes mellitus with other diabetic ophthalmic complication: Secondary | ICD-10-CM | POA: Diagnosis present

## 2013-12-27 DIAGNOSIS — N186 End stage renal disease: Secondary | ICD-10-CM | POA: Diagnosis not present

## 2014-01-04 DIAGNOSIS — I770 Arteriovenous fistula, acquired: Secondary | ICD-10-CM | POA: Diagnosis not present

## 2014-01-04 DIAGNOSIS — I120 Hypertensive chronic kidney disease with stage 5 chronic kidney disease or end stage renal disease: Secondary | ICD-10-CM | POA: Diagnosis not present

## 2014-01-04 DIAGNOSIS — E1329 Other specified diabetes mellitus with other diabetic kidney complication: Secondary | ICD-10-CM | POA: Diagnosis not present

## 2014-01-04 DIAGNOSIS — Z940 Kidney transplant status: Secondary | ICD-10-CM | POA: Diagnosis not present

## 2014-01-04 DIAGNOSIS — E119 Type 2 diabetes mellitus without complications: Secondary | ICD-10-CM | POA: Diagnosis not present

## 2014-01-04 DIAGNOSIS — M311 Thrombotic microangiopathy, unspecified: Secondary | ICD-10-CM | POA: Diagnosis not present

## 2014-01-04 DIAGNOSIS — N058 Unspecified nephritic syndrome with other morphologic changes: Secondary | ICD-10-CM | POA: Diagnosis not present

## 2014-01-04 DIAGNOSIS — N186 End stage renal disease: Secondary | ICD-10-CM | POA: Diagnosis not present

## 2014-01-08 DIAGNOSIS — E11319 Type 2 diabetes mellitus with unspecified diabetic retinopathy without macular edema: Secondary | ICD-10-CM | POA: Diagnosis not present

## 2014-01-08 DIAGNOSIS — N186 End stage renal disease: Secondary | ICD-10-CM | POA: Diagnosis not present

## 2014-01-08 DIAGNOSIS — I10 Essential (primary) hypertension: Secondary | ICD-10-CM | POA: Diagnosis not present

## 2014-01-08 DIAGNOSIS — Z940 Kidney transplant status: Secondary | ICD-10-CM | POA: Diagnosis not present

## 2014-01-08 DIAGNOSIS — M311 Thrombotic microangiopathy, unspecified: Secondary | ICD-10-CM | POA: Diagnosis not present

## 2014-01-08 DIAGNOSIS — I120 Hypertensive chronic kidney disease with stage 5 chronic kidney disease or end stage renal disease: Secondary | ICD-10-CM | POA: Diagnosis not present

## 2014-01-08 DIAGNOSIS — E1139 Type 2 diabetes mellitus with other diabetic ophthalmic complication: Secondary | ICD-10-CM | POA: Diagnosis not present

## 2014-01-08 DIAGNOSIS — E119 Type 2 diabetes mellitus without complications: Secondary | ICD-10-CM | POA: Diagnosis not present

## 2014-01-09 DIAGNOSIS — N186 End stage renal disease: Secondary | ICD-10-CM | POA: Diagnosis not present

## 2014-01-09 DIAGNOSIS — E119 Type 2 diabetes mellitus without complications: Secondary | ICD-10-CM | POA: Diagnosis not present

## 2014-01-09 DIAGNOSIS — I120 Hypertensive chronic kidney disease with stage 5 chronic kidney disease or end stage renal disease: Secondary | ICD-10-CM | POA: Diagnosis not present

## 2014-01-09 DIAGNOSIS — M311 Thrombotic microangiopathy, unspecified: Secondary | ICD-10-CM | POA: Diagnosis not present

## 2014-01-10 ENCOUNTER — Encounter: Payer: Medicare Other | Admitting: Dietician

## 2014-01-10 ENCOUNTER — Encounter: Payer: Self-pay | Admitting: Internal Medicine

## 2014-01-10 ENCOUNTER — Ambulatory Visit: Payer: Medicare Other | Admitting: Internal Medicine

## 2014-01-11 DIAGNOSIS — E1329 Other specified diabetes mellitus with other diabetic kidney complication: Secondary | ICD-10-CM | POA: Diagnosis not present

## 2014-01-11 DIAGNOSIS — N058 Unspecified nephritic syndrome with other morphologic changes: Secondary | ICD-10-CM | POA: Diagnosis not present

## 2014-01-11 DIAGNOSIS — N186 End stage renal disease: Secondary | ICD-10-CM | POA: Diagnosis not present

## 2014-01-11 DIAGNOSIS — I10 Essential (primary) hypertension: Secondary | ICD-10-CM | POA: Diagnosis not present

## 2014-01-11 DIAGNOSIS — M311 Thrombotic microangiopathy, unspecified: Secondary | ICD-10-CM | POA: Diagnosis not present

## 2014-01-11 DIAGNOSIS — D696 Thrombocytopenia, unspecified: Secondary | ICD-10-CM | POA: Diagnosis not present

## 2014-01-11 DIAGNOSIS — Z79899 Other long term (current) drug therapy: Secondary | ICD-10-CM | POA: Diagnosis not present

## 2014-01-11 DIAGNOSIS — I120 Hypertensive chronic kidney disease with stage 5 chronic kidney disease or end stage renal disease: Secondary | ICD-10-CM | POA: Diagnosis not present

## 2014-01-11 DIAGNOSIS — Z940 Kidney transplant status: Secondary | ICD-10-CM | POA: Diagnosis not present

## 2014-01-14 DIAGNOSIS — D631 Anemia in chronic kidney disease: Secondary | ICD-10-CM | POA: Diagnosis not present

## 2014-01-14 DIAGNOSIS — N039 Chronic nephritic syndrome with unspecified morphologic changes: Secondary | ICD-10-CM | POA: Diagnosis not present

## 2014-01-14 DIAGNOSIS — D6859 Other primary thrombophilia: Secondary | ICD-10-CM | POA: Diagnosis not present

## 2014-01-14 DIAGNOSIS — E1129 Type 2 diabetes mellitus with other diabetic kidney complication: Secondary | ICD-10-CM | POA: Diagnosis not present

## 2014-01-14 DIAGNOSIS — M311 Thrombotic microangiopathy, unspecified: Secondary | ICD-10-CM | POA: Diagnosis not present

## 2014-01-14 DIAGNOSIS — N189 Chronic kidney disease, unspecified: Secondary | ICD-10-CM | POA: Diagnosis not present

## 2014-01-14 DIAGNOSIS — N186 End stage renal disease: Secondary | ICD-10-CM | POA: Diagnosis not present

## 2014-01-14 DIAGNOSIS — I129 Hypertensive chronic kidney disease with stage 1 through stage 4 chronic kidney disease, or unspecified chronic kidney disease: Secondary | ICD-10-CM | POA: Diagnosis not present

## 2014-01-14 DIAGNOSIS — N058 Unspecified nephritic syndrome with other morphologic changes: Secondary | ICD-10-CM | POA: Diagnosis not present

## 2014-01-14 DIAGNOSIS — Z79899 Other long term (current) drug therapy: Secondary | ICD-10-CM | POA: Diagnosis not present

## 2014-01-14 DIAGNOSIS — Z940 Kidney transplant status: Secondary | ICD-10-CM | POA: Diagnosis not present

## 2014-01-16 DIAGNOSIS — E119 Type 2 diabetes mellitus without complications: Secondary | ICD-10-CM | POA: Diagnosis not present

## 2014-01-16 DIAGNOSIS — I120 Hypertensive chronic kidney disease with stage 5 chronic kidney disease or end stage renal disease: Secondary | ICD-10-CM | POA: Diagnosis not present

## 2014-01-16 DIAGNOSIS — N186 End stage renal disease: Secondary | ICD-10-CM | POA: Diagnosis not present

## 2014-01-16 DIAGNOSIS — M311 Thrombotic microangiopathy, unspecified: Secondary | ICD-10-CM | POA: Diagnosis not present

## 2014-01-18 DIAGNOSIS — N186 End stage renal disease: Secondary | ICD-10-CM | POA: Diagnosis not present

## 2014-01-18 DIAGNOSIS — Z48298 Encounter for aftercare following other organ transplant: Secondary | ICD-10-CM | POA: Diagnosis not present

## 2014-01-18 DIAGNOSIS — I120 Hypertensive chronic kidney disease with stage 5 chronic kidney disease or end stage renal disease: Secondary | ICD-10-CM | POA: Diagnosis not present

## 2014-01-18 DIAGNOSIS — Z79899 Other long term (current) drug therapy: Secondary | ICD-10-CM | POA: Diagnosis not present

## 2014-01-18 DIAGNOSIS — E1129 Type 2 diabetes mellitus with other diabetic kidney complication: Secondary | ICD-10-CM | POA: Diagnosis not present

## 2014-01-18 DIAGNOSIS — N058 Unspecified nephritic syndrome with other morphologic changes: Secondary | ICD-10-CM | POA: Diagnosis not present

## 2014-01-18 DIAGNOSIS — I10 Essential (primary) hypertension: Secondary | ICD-10-CM | POA: Diagnosis not present

## 2014-01-18 DIAGNOSIS — Z940 Kidney transplant status: Secondary | ICD-10-CM | POA: Diagnosis not present

## 2014-01-18 DIAGNOSIS — M311 Thrombotic microangiopathy, unspecified: Secondary | ICD-10-CM | POA: Diagnosis not present

## 2014-01-21 DIAGNOSIS — E1149 Type 2 diabetes mellitus with other diabetic neurological complication: Secondary | ICD-10-CM | POA: Diagnosis not present

## 2014-01-21 DIAGNOSIS — D649 Anemia, unspecified: Secondary | ICD-10-CM | POA: Diagnosis not present

## 2014-01-21 DIAGNOSIS — M311 Thrombotic microangiopathy, unspecified: Secondary | ICD-10-CM | POA: Diagnosis not present

## 2014-01-21 DIAGNOSIS — I509 Heart failure, unspecified: Secondary | ICD-10-CM | POA: Diagnosis not present

## 2014-01-21 DIAGNOSIS — E872 Acidosis, unspecified: Secondary | ICD-10-CM | POA: Diagnosis not present

## 2014-01-21 DIAGNOSIS — Z794 Long term (current) use of insulin: Secondary | ICD-10-CM | POA: Diagnosis not present

## 2014-01-21 DIAGNOSIS — Z79899 Other long term (current) drug therapy: Secondary | ICD-10-CM | POA: Diagnosis not present

## 2014-01-21 DIAGNOSIS — I428 Other cardiomyopathies: Secondary | ICD-10-CM | POA: Diagnosis not present

## 2014-01-21 DIAGNOSIS — E1139 Type 2 diabetes mellitus with other diabetic ophthalmic complication: Secondary | ICD-10-CM | POA: Diagnosis not present

## 2014-01-21 DIAGNOSIS — D6859 Other primary thrombophilia: Secondary | ICD-10-CM | POA: Diagnosis not present

## 2014-01-21 DIAGNOSIS — I10 Essential (primary) hypertension: Secondary | ICD-10-CM | POA: Diagnosis not present

## 2014-01-21 DIAGNOSIS — Z48298 Encounter for aftercare following other organ transplant: Secondary | ICD-10-CM | POA: Diagnosis not present

## 2014-01-21 DIAGNOSIS — Z7982 Long term (current) use of aspirin: Secondary | ICD-10-CM | POA: Diagnosis not present

## 2014-01-21 DIAGNOSIS — E1142 Type 2 diabetes mellitus with diabetic polyneuropathy: Secondary | ICD-10-CM | POA: Diagnosis not present

## 2014-01-21 DIAGNOSIS — IMO0002 Reserved for concepts with insufficient information to code with codable children: Secondary | ICD-10-CM | POA: Diagnosis not present

## 2014-01-21 DIAGNOSIS — Z5181 Encounter for therapeutic drug level monitoring: Secondary | ICD-10-CM | POA: Diagnosis not present

## 2014-01-21 DIAGNOSIS — H3530 Unspecified macular degeneration: Secondary | ICD-10-CM | POA: Diagnosis not present

## 2014-01-21 DIAGNOSIS — E11359 Type 2 diabetes mellitus with proliferative diabetic retinopathy without macular edema: Secondary | ICD-10-CM | POA: Diagnosis not present

## 2014-01-21 DIAGNOSIS — Z940 Kidney transplant status: Secondary | ICD-10-CM | POA: Diagnosis not present

## 2014-01-21 DIAGNOSIS — Z9109 Other allergy status, other than to drugs and biological substances: Secondary | ICD-10-CM | POA: Diagnosis not present

## 2014-01-22 DIAGNOSIS — D649 Anemia, unspecified: Secondary | ICD-10-CM | POA: Diagnosis not present

## 2014-01-22 DIAGNOSIS — I428 Other cardiomyopathies: Secondary | ICD-10-CM | POA: Diagnosis not present

## 2014-01-22 DIAGNOSIS — Z5181 Encounter for therapeutic drug level monitoring: Secondary | ICD-10-CM | POA: Diagnosis not present

## 2014-01-22 DIAGNOSIS — E872 Acidosis, unspecified: Secondary | ICD-10-CM | POA: Diagnosis not present

## 2014-01-22 DIAGNOSIS — Z940 Kidney transplant status: Secondary | ICD-10-CM | POA: Diagnosis not present

## 2014-01-22 DIAGNOSIS — Z79899 Other long term (current) drug therapy: Secondary | ICD-10-CM | POA: Diagnosis not present

## 2014-01-22 DIAGNOSIS — Z48298 Encounter for aftercare following other organ transplant: Secondary | ICD-10-CM | POA: Diagnosis not present

## 2014-01-24 DIAGNOSIS — N189 Chronic kidney disease, unspecified: Secondary | ICD-10-CM | POA: Diagnosis not present

## 2014-01-24 DIAGNOSIS — Z79899 Other long term (current) drug therapy: Secondary | ICD-10-CM | POA: Diagnosis not present

## 2014-01-24 DIAGNOSIS — I509 Heart failure, unspecified: Secondary | ICD-10-CM | POA: Diagnosis not present

## 2014-01-24 DIAGNOSIS — D631 Anemia in chronic kidney disease: Secondary | ICD-10-CM | POA: Diagnosis not present

## 2014-01-24 DIAGNOSIS — D649 Anemia, unspecified: Secondary | ICD-10-CM | POA: Diagnosis not present

## 2014-01-24 DIAGNOSIS — Z5181 Encounter for therapeutic drug level monitoring: Secondary | ICD-10-CM | POA: Diagnosis not present

## 2014-01-24 DIAGNOSIS — D696 Thrombocytopenia, unspecified: Secondary | ICD-10-CM | POA: Diagnosis not present

## 2014-01-24 DIAGNOSIS — Z940 Kidney transplant status: Secondary | ICD-10-CM | POA: Diagnosis not present

## 2014-01-24 DIAGNOSIS — Z794 Long term (current) use of insulin: Secondary | ICD-10-CM | POA: Diagnosis not present

## 2014-01-24 DIAGNOSIS — E119 Type 2 diabetes mellitus without complications: Secondary | ICD-10-CM | POA: Diagnosis not present

## 2014-01-24 DIAGNOSIS — Z48298 Encounter for aftercare following other organ transplant: Secondary | ICD-10-CM | POA: Diagnosis not present

## 2014-01-24 DIAGNOSIS — Z7982 Long term (current) use of aspirin: Secondary | ICD-10-CM | POA: Diagnosis not present

## 2014-01-24 DIAGNOSIS — D6859 Other primary thrombophilia: Secondary | ICD-10-CM | POA: Diagnosis not present

## 2014-01-24 DIAGNOSIS — I10 Essential (primary) hypertension: Secondary | ICD-10-CM | POA: Diagnosis not present

## 2014-01-25 DIAGNOSIS — Z5181 Encounter for therapeutic drug level monitoring: Secondary | ICD-10-CM | POA: Diagnosis not present

## 2014-01-25 DIAGNOSIS — I129 Hypertensive chronic kidney disease with stage 1 through stage 4 chronic kidney disease, or unspecified chronic kidney disease: Secondary | ICD-10-CM | POA: Diagnosis not present

## 2014-01-25 DIAGNOSIS — Z79899 Other long term (current) drug therapy: Secondary | ICD-10-CM | POA: Diagnosis not present

## 2014-01-25 DIAGNOSIS — D631 Anemia in chronic kidney disease: Secondary | ICD-10-CM | POA: Diagnosis not present

## 2014-01-25 DIAGNOSIS — D696 Thrombocytopenia, unspecified: Secondary | ICD-10-CM | POA: Diagnosis not present

## 2014-01-25 DIAGNOSIS — E119 Type 2 diabetes mellitus without complications: Secondary | ICD-10-CM | POA: Diagnosis not present

## 2014-01-25 DIAGNOSIS — Z940 Kidney transplant status: Secondary | ICD-10-CM | POA: Diagnosis not present

## 2014-01-25 DIAGNOSIS — Z48298 Encounter for aftercare following other organ transplant: Secondary | ICD-10-CM | POA: Diagnosis not present

## 2014-01-25 DIAGNOSIS — D649 Anemia, unspecified: Secondary | ICD-10-CM | POA: Diagnosis not present

## 2014-01-25 DIAGNOSIS — I10 Essential (primary) hypertension: Secondary | ICD-10-CM | POA: Diagnosis not present

## 2014-01-25 DIAGNOSIS — I509 Heart failure, unspecified: Secondary | ICD-10-CM | POA: Diagnosis not present

## 2014-01-26 DIAGNOSIS — D696 Thrombocytopenia, unspecified: Secondary | ICD-10-CM | POA: Diagnosis not present

## 2014-01-26 DIAGNOSIS — Z940 Kidney transplant status: Secondary | ICD-10-CM | POA: Diagnosis not present

## 2014-01-26 DIAGNOSIS — E119 Type 2 diabetes mellitus without complications: Secondary | ICD-10-CM | POA: Diagnosis not present

## 2014-01-26 DIAGNOSIS — D649 Anemia, unspecified: Secondary | ICD-10-CM | POA: Diagnosis not present

## 2014-01-26 DIAGNOSIS — I509 Heart failure, unspecified: Secondary | ICD-10-CM | POA: Diagnosis not present

## 2014-01-26 DIAGNOSIS — I10 Essential (primary) hypertension: Secondary | ICD-10-CM | POA: Diagnosis not present

## 2014-02-01 DIAGNOSIS — Z940 Kidney transplant status: Secondary | ICD-10-CM | POA: Diagnosis not present

## 2014-02-01 DIAGNOSIS — N186 End stage renal disease: Secondary | ICD-10-CM | POA: Diagnosis not present

## 2014-02-01 DIAGNOSIS — E1065 Type 1 diabetes mellitus with hyperglycemia: Secondary | ICD-10-CM | POA: Diagnosis not present

## 2014-02-01 DIAGNOSIS — D899 Disorder involving the immune mechanism, unspecified: Secondary | ICD-10-CM | POA: Diagnosis not present

## 2014-02-01 DIAGNOSIS — I120 Hypertensive chronic kidney disease with stage 5 chronic kidney disease or end stage renal disease: Secondary | ICD-10-CM | POA: Diagnosis not present

## 2014-02-01 DIAGNOSIS — T8610 Unspecified complication of kidney transplant: Secondary | ICD-10-CM | POA: Diagnosis not present

## 2014-02-01 DIAGNOSIS — D696 Thrombocytopenia, unspecified: Secondary | ICD-10-CM | POA: Diagnosis not present

## 2014-02-01 DIAGNOSIS — E119 Type 2 diabetes mellitus without complications: Secondary | ICD-10-CM | POA: Diagnosis not present

## 2014-02-01 DIAGNOSIS — IMO0002 Reserved for concepts with insufficient information to code with codable children: Secondary | ICD-10-CM | POA: Diagnosis not present

## 2014-02-01 DIAGNOSIS — M311 Thrombotic microangiopathy, unspecified: Secondary | ICD-10-CM | POA: Diagnosis not present

## 2014-02-01 DIAGNOSIS — D6859 Other primary thrombophilia: Secondary | ICD-10-CM | POA: Diagnosis not present

## 2014-02-01 DIAGNOSIS — I10 Essential (primary) hypertension: Secondary | ICD-10-CM | POA: Diagnosis not present

## 2014-02-07 DIAGNOSIS — E11319 Type 2 diabetes mellitus with unspecified diabetic retinopathy without macular edema: Secondary | ICD-10-CM | POA: Diagnosis not present

## 2014-02-07 DIAGNOSIS — E1139 Type 2 diabetes mellitus with other diabetic ophthalmic complication: Secondary | ICD-10-CM | POA: Diagnosis not present

## 2014-02-07 DIAGNOSIS — Z48298 Encounter for aftercare following other organ transplant: Secondary | ICD-10-CM | POA: Diagnosis not present

## 2014-02-07 DIAGNOSIS — Z794 Long term (current) use of insulin: Secondary | ICD-10-CM | POA: Diagnosis not present

## 2014-02-07 DIAGNOSIS — N186 End stage renal disease: Secondary | ICD-10-CM | POA: Diagnosis not present

## 2014-02-07 DIAGNOSIS — I428 Other cardiomyopathies: Secondary | ICD-10-CM | POA: Diagnosis not present

## 2014-02-07 DIAGNOSIS — D696 Thrombocytopenia, unspecified: Secondary | ICD-10-CM | POA: Diagnosis not present

## 2014-02-07 DIAGNOSIS — N183 Chronic kidney disease, stage 3 unspecified: Secondary | ICD-10-CM | POA: Diagnosis not present

## 2014-02-07 DIAGNOSIS — E538 Deficiency of other specified B group vitamins: Secondary | ICD-10-CM | POA: Diagnosis not present

## 2014-02-07 DIAGNOSIS — Z7982 Long term (current) use of aspirin: Secondary | ICD-10-CM | POA: Diagnosis not present

## 2014-02-07 DIAGNOSIS — I120 Hypertensive chronic kidney disease with stage 5 chronic kidney disease or end stage renal disease: Secondary | ICD-10-CM | POA: Diagnosis not present

## 2014-02-07 DIAGNOSIS — Z905 Acquired absence of kidney: Secondary | ICD-10-CM | POA: Diagnosis not present

## 2014-02-07 DIAGNOSIS — E872 Acidosis, unspecified: Secondary | ICD-10-CM | POA: Diagnosis not present

## 2014-02-07 DIAGNOSIS — D899 Disorder involving the immune mechanism, unspecified: Secondary | ICD-10-CM | POA: Diagnosis not present

## 2014-02-07 DIAGNOSIS — N039 Chronic nephritic syndrome with unspecified morphologic changes: Secondary | ICD-10-CM | POA: Diagnosis not present

## 2014-02-07 DIAGNOSIS — D631 Anemia in chronic kidney disease: Secondary | ICD-10-CM | POA: Diagnosis not present

## 2014-02-07 DIAGNOSIS — I129 Hypertensive chronic kidney disease with stage 1 through stage 4 chronic kidney disease, or unspecified chronic kidney disease: Secondary | ICD-10-CM | POA: Diagnosis not present

## 2014-02-07 DIAGNOSIS — D6859 Other primary thrombophilia: Secondary | ICD-10-CM | POA: Diagnosis not present

## 2014-02-07 DIAGNOSIS — T8610 Unspecified complication of kidney transplant: Secondary | ICD-10-CM | POA: Diagnosis not present

## 2014-02-07 DIAGNOSIS — Z940 Kidney transplant status: Secondary | ICD-10-CM | POA: Diagnosis not present

## 2014-02-14 ENCOUNTER — Telehealth: Payer: Self-pay | Admitting: Dietician

## 2014-02-14 DIAGNOSIS — E1065 Type 1 diabetes mellitus with hyperglycemia: Secondary | ICD-10-CM | POA: Diagnosis not present

## 2014-02-14 DIAGNOSIS — Z940 Kidney transplant status: Secondary | ICD-10-CM | POA: Diagnosis not present

## 2014-02-14 DIAGNOSIS — N186 End stage renal disease: Secondary | ICD-10-CM | POA: Diagnosis not present

## 2014-02-14 DIAGNOSIS — M311 Thrombotic microangiopathy, unspecified: Secondary | ICD-10-CM | POA: Diagnosis not present

## 2014-02-14 DIAGNOSIS — I10 Essential (primary) hypertension: Secondary | ICD-10-CM | POA: Diagnosis not present

## 2014-02-14 DIAGNOSIS — D696 Thrombocytopenia, unspecified: Secondary | ICD-10-CM | POA: Diagnosis not present

## 2014-02-14 DIAGNOSIS — T8610 Unspecified complication of kidney transplant: Secondary | ICD-10-CM | POA: Diagnosis not present

## 2014-02-14 DIAGNOSIS — E119 Type 2 diabetes mellitus without complications: Secondary | ICD-10-CM | POA: Diagnosis not present

## 2014-02-14 DIAGNOSIS — IMO0002 Reserved for concepts with insufficient information to code with codable children: Secondary | ICD-10-CM | POA: Diagnosis not present

## 2014-02-18 DIAGNOSIS — Z940 Kidney transplant status: Secondary | ICD-10-CM | POA: Diagnosis not present

## 2014-02-21 DIAGNOSIS — D899 Disorder involving the immune mechanism, unspecified: Secondary | ICD-10-CM | POA: Diagnosis not present

## 2014-02-21 DIAGNOSIS — E1139 Type 2 diabetes mellitus with other diabetic ophthalmic complication: Secondary | ICD-10-CM | POA: Diagnosis not present

## 2014-02-21 DIAGNOSIS — M311 Thrombotic microangiopathy, unspecified: Secondary | ICD-10-CM | POA: Diagnosis not present

## 2014-02-21 DIAGNOSIS — N186 End stage renal disease: Secondary | ICD-10-CM | POA: Diagnosis not present

## 2014-02-21 DIAGNOSIS — E119 Type 2 diabetes mellitus without complications: Secondary | ICD-10-CM | POA: Diagnosis not present

## 2014-02-21 DIAGNOSIS — E11319 Type 2 diabetes mellitus with unspecified diabetic retinopathy without macular edema: Secondary | ICD-10-CM | POA: Diagnosis not present

## 2014-02-21 DIAGNOSIS — Z940 Kidney transplant status: Secondary | ICD-10-CM | POA: Diagnosis not present

## 2014-02-21 DIAGNOSIS — I120 Hypertensive chronic kidney disease with stage 5 chronic kidney disease or end stage renal disease: Secondary | ICD-10-CM | POA: Diagnosis not present

## 2014-02-21 DIAGNOSIS — T8610 Unspecified complication of kidney transplant: Secondary | ICD-10-CM | POA: Diagnosis not present

## 2014-02-21 DIAGNOSIS — I10 Essential (primary) hypertension: Secondary | ICD-10-CM | POA: Diagnosis not present

## 2014-03-07 DIAGNOSIS — M311 Thrombotic microangiopathy, unspecified: Secondary | ICD-10-CM | POA: Diagnosis not present

## 2014-03-07 DIAGNOSIS — Z79899 Other long term (current) drug therapy: Secondary | ICD-10-CM | POA: Diagnosis not present

## 2014-03-07 DIAGNOSIS — N183 Chronic kidney disease, stage 3 unspecified: Secondary | ICD-10-CM | POA: Diagnosis not present

## 2014-03-07 DIAGNOSIS — Z940 Kidney transplant status: Secondary | ICD-10-CM | POA: Diagnosis not present

## 2014-03-07 DIAGNOSIS — E1065 Type 1 diabetes mellitus with hyperglycemia: Secondary | ICD-10-CM | POA: Diagnosis not present

## 2014-03-07 DIAGNOSIS — D649 Anemia, unspecified: Secondary | ICD-10-CM | POA: Diagnosis not present

## 2014-03-07 DIAGNOSIS — IMO0002 Reserved for concepts with insufficient information to code with codable children: Secondary | ICD-10-CM | POA: Diagnosis not present

## 2014-03-07 DIAGNOSIS — I129 Hypertensive chronic kidney disease with stage 1 through stage 4 chronic kidney disease, or unspecified chronic kidney disease: Secondary | ICD-10-CM | POA: Diagnosis not present

## 2014-03-07 DIAGNOSIS — N186 End stage renal disease: Secondary | ICD-10-CM | POA: Diagnosis not present

## 2014-03-07 DIAGNOSIS — I428 Other cardiomyopathies: Secondary | ICD-10-CM | POA: Diagnosis not present

## 2014-03-07 DIAGNOSIS — Z48298 Encounter for aftercare following other organ transplant: Secondary | ICD-10-CM | POA: Diagnosis not present

## 2014-03-07 DIAGNOSIS — D696 Thrombocytopenia, unspecified: Secondary | ICD-10-CM | POA: Diagnosis not present

## 2014-03-08 NOTE — Telephone Encounter (Signed)
Unable to contact patient for last eye exam. Will request information at next office visit.

## 2014-03-21 DIAGNOSIS — Z79899 Other long term (current) drug therapy: Secondary | ICD-10-CM | POA: Diagnosis not present

## 2014-03-21 DIAGNOSIS — Z794 Long term (current) use of insulin: Secondary | ICD-10-CM | POA: Diagnosis not present

## 2014-03-21 DIAGNOSIS — E1065 Type 1 diabetes mellitus with hyperglycemia: Secondary | ICD-10-CM | POA: Diagnosis not present

## 2014-03-21 DIAGNOSIS — Z940 Kidney transplant status: Secondary | ICD-10-CM | POA: Diagnosis not present

## 2014-03-21 DIAGNOSIS — I10 Essential (primary) hypertension: Secondary | ICD-10-CM | POA: Diagnosis not present

## 2014-03-21 DIAGNOSIS — D696 Thrombocytopenia, unspecified: Secondary | ICD-10-CM | POA: Diagnosis not present

## 2014-03-21 DIAGNOSIS — Z48298 Encounter for aftercare following other organ transplant: Secondary | ICD-10-CM | POA: Diagnosis not present

## 2014-03-21 DIAGNOSIS — IMO0002 Reserved for concepts with insufficient information to code with codable children: Secondary | ICD-10-CM | POA: Diagnosis not present

## 2014-03-22 DIAGNOSIS — R799 Abnormal finding of blood chemistry, unspecified: Secondary | ICD-10-CM | POA: Diagnosis not present

## 2014-03-22 DIAGNOSIS — E1065 Type 1 diabetes mellitus with hyperglycemia: Secondary | ICD-10-CM | POA: Diagnosis not present

## 2014-03-22 DIAGNOSIS — I10 Essential (primary) hypertension: Secondary | ICD-10-CM | POA: Diagnosis not present

## 2014-03-22 DIAGNOSIS — IMO0002 Reserved for concepts with insufficient information to code with codable children: Secondary | ICD-10-CM | POA: Diagnosis not present

## 2014-03-22 DIAGNOSIS — Z940 Kidney transplant status: Secondary | ICD-10-CM | POA: Diagnosis not present

## 2014-03-22 DIAGNOSIS — Z79899 Other long term (current) drug therapy: Secondary | ICD-10-CM | POA: Diagnosis not present

## 2014-03-22 DIAGNOSIS — N170 Acute kidney failure with tubular necrosis: Secondary | ICD-10-CM | POA: Diagnosis not present

## 2014-03-22 DIAGNOSIS — D696 Thrombocytopenia, unspecified: Secondary | ICD-10-CM | POA: Diagnosis not present

## 2014-03-22 DIAGNOSIS — D899 Disorder involving the immune mechanism, unspecified: Secondary | ICD-10-CM | POA: Diagnosis not present

## 2014-03-22 DIAGNOSIS — Z5181 Encounter for therapeutic drug level monitoring: Secondary | ICD-10-CM | POA: Diagnosis not present

## 2014-03-22 DIAGNOSIS — Z48298 Encounter for aftercare following other organ transplant: Secondary | ICD-10-CM | POA: Diagnosis not present

## 2014-03-22 DIAGNOSIS — N269 Renal sclerosis, unspecified: Secondary | ICD-10-CM | POA: Diagnosis not present

## 2014-03-22 DIAGNOSIS — N186 End stage renal disease: Secondary | ICD-10-CM | POA: Diagnosis not present

## 2014-04-04 DIAGNOSIS — I10 Essential (primary) hypertension: Secondary | ICD-10-CM | POA: Diagnosis not present

## 2014-04-04 DIAGNOSIS — E1065 Type 1 diabetes mellitus with hyperglycemia: Secondary | ICD-10-CM | POA: Diagnosis not present

## 2014-04-04 DIAGNOSIS — N058 Unspecified nephritic syndrome with other morphologic changes: Secondary | ICD-10-CM | POA: Diagnosis not present

## 2014-04-04 DIAGNOSIS — T8610 Unspecified complication of kidney transplant: Secondary | ICD-10-CM | POA: Diagnosis not present

## 2014-04-04 DIAGNOSIS — Z48298 Encounter for aftercare following other organ transplant: Secondary | ICD-10-CM | POA: Diagnosis not present

## 2014-04-04 DIAGNOSIS — Z79899 Other long term (current) drug therapy: Secondary | ICD-10-CM | POA: Diagnosis not present

## 2014-04-04 DIAGNOSIS — Z940 Kidney transplant status: Secondary | ICD-10-CM | POA: Diagnosis not present

## 2014-04-04 DIAGNOSIS — IMO0002 Reserved for concepts with insufficient information to code with codable children: Secondary | ICD-10-CM | POA: Diagnosis not present

## 2014-04-04 DIAGNOSIS — E1129 Type 2 diabetes mellitus with other diabetic kidney complication: Secondary | ICD-10-CM | POA: Diagnosis not present

## 2014-04-24 ENCOUNTER — Ambulatory Visit (INDEPENDENT_AMBULATORY_CARE_PROVIDER_SITE_OTHER): Payer: Medicare Other | Admitting: Internal Medicine

## 2014-04-24 ENCOUNTER — Ambulatory Visit: Payer: Medicare Other | Admitting: Dietician

## 2014-04-24 VITALS — BP 132/68 | HR 84 | Temp 98.2°F | Ht 65.0 in | Wt 172.8 lb

## 2014-04-24 DIAGNOSIS — I120 Hypertensive chronic kidney disease with stage 5 chronic kidney disease or end stage renal disease: Secondary | ICD-10-CM | POA: Diagnosis not present

## 2014-04-24 DIAGNOSIS — Z23 Encounter for immunization: Secondary | ICD-10-CM

## 2014-04-24 DIAGNOSIS — K044 Acute apical periodontitis of pulpal origin: Secondary | ICD-10-CM | POA: Diagnosis not present

## 2014-04-24 DIAGNOSIS — I10 Essential (primary) hypertension: Secondary | ICD-10-CM

## 2014-04-24 DIAGNOSIS — Z0000 Encounter for general adult medical examination without abnormal findings: Secondary | ICD-10-CM | POA: Insufficient documentation

## 2014-04-24 DIAGNOSIS — E1165 Type 2 diabetes mellitus with hyperglycemia: Secondary | ICD-10-CM | POA: Diagnosis not present

## 2014-04-24 DIAGNOSIS — E785 Hyperlipidemia, unspecified: Secondary | ICD-10-CM | POA: Diagnosis not present

## 2014-04-24 DIAGNOSIS — E118 Type 2 diabetes mellitus with unspecified complications: Principal | ICD-10-CM

## 2014-04-24 DIAGNOSIS — E11359 Type 2 diabetes mellitus with proliferative diabetic retinopathy without macular edema: Secondary | ICD-10-CM | POA: Diagnosis not present

## 2014-04-24 DIAGNOSIS — I428 Other cardiomyopathies: Secondary | ICD-10-CM | POA: Diagnosis not present

## 2014-04-24 DIAGNOSIS — IMO0002 Reserved for concepts with insufficient information to code with codable children: Secondary | ICD-10-CM | POA: Diagnosis not present

## 2014-04-24 DIAGNOSIS — N186 End stage renal disease: Secondary | ICD-10-CM

## 2014-04-24 DIAGNOSIS — B350 Tinea barbae and tinea capitis: Secondary | ICD-10-CM

## 2014-04-24 LAB — GLUCOSE, CAPILLARY: GLUCOSE-CAPILLARY: 209 mg/dL — AB (ref 70–99)

## 2014-04-24 LAB — POCT GLYCOSYLATED HEMOGLOBIN (HGB A1C): Hemoglobin A1C: 5.9

## 2014-04-24 MED ORDER — FREESTYLE SYSTEM KIT: 1.0000 | PACK | Status: DC | PRN

## 2014-04-24 MED ORDER — INSULIN GLARGINE 100 UNIT/ML ~~LOC~~ SOLN: 45.0000 [IU] | Freq: Every day | SUBCUTANEOUS | Status: DC

## 2014-04-24 MED ORDER — ACCU-CHEK FASTCLIX LANCETS MISC: Status: DC

## 2014-04-24 MED ORDER — GLUCOSE BLOOD VI STRP: ORAL_STRIP | Status: DC

## 2014-04-24 MED ORDER — ACCU-CHEK NANO SMARTVIEW W/DEVICE KIT: PACK | Status: DC

## 2014-04-24 MED ORDER — KETOCONAZOLE 2 % EX SHAM: MEDICATED_SHAMPOO | CUTANEOUS | Status: DC

## 2014-04-24 MED ORDER — TRIAMCINOLONE ACETONIDE 0.025 % EX OINT: TOPICAL_OINTMENT | Freq: Two times a day (BID) | CUTANEOUS | Status: DC

## 2014-04-24 NOTE — Assessment & Plan Note (Signed)
Patient received Tdap, pneumococcal, and flu vaccines today after checking that immunosuppressive drugs are not contraindications.

## 2014-04-24 NOTE — Progress Notes (Signed)
   Subjective:    Patient ID: [name], male    DOB: [birth year], 43 y.o.   MRN: [id number]  HPI  Mr Urman is a 43 year old man with ESRD s/p renal transplant 12/2013, DM2, and HTN here for routine checkup. He successfully underwent kidney transplant at [facility] [id number] with no complications. He is on several immunosuppressive medications which have been added to his medication list. His nephrologist has been managing his diabetes medication and discontinued his glipizide and sitagliptin and he currently is on lantus 50 u qhs and novolog 10 u with meals. He does not have his meter today as it is broken but his wife says in the morning it has been low, usually in the 50s-80s. He is asymptomatic. He also thinks his hair fungal infection has returned.  Review of Systems  Constitutional: Negative for fever, chills, diaphoresis, appetite change and unexpected weight change.  Eyes: Negative for visual disturbance.  Respiratory: Negative for shortness of breath.   Cardiovascular: Negative for chest pain and palpitations.  Gastrointestinal: Negative for nausea, abdominal pain, diarrhea and constipation.  Endocrine: Negative for polydipsia.  Neurological: Negative for weakness, light-headedness, numbness and headaches.       Objective:   Physical Exam  Vitals reviewed. Constitutional: He is oriented to person, place, and time. He appears well-developed and well-nourished. No distress.  HENT:  Head: Normocephalic and atraumatic.  Mouth/Throat: Oropharynx is clear and moist.  Grey macular skin lesion over R ear  Eyes: EOM are normal. Pupils are equal, round, and reactive to light.  Cardiovascular: Normal rate, regular rhythm, normal heart sounds and intact distal pulses.  Exam reveals no gallop and no friction rub.   No murmur heard. Pulmonary/Chest: Breath sounds normal. No respiratory distress.  Abdominal: Soft. Bowel sounds are normal.  L and R vertical incisions with L granulation  tissue appearing newer but c/d/i. L sided kidney transplant palpable  Musculoskeletal: He exhibits no edema.  Neurological: He is alert and oriented to person, place, and time.  Skin: He is not diaphoretic.          Assessment & Plan:

## 2014-04-24 NOTE — Progress Notes (Signed)
Assisted patient's wife with obtaining new meter to check patient's blood sugars and learning how to use the lancing device. Patient to reschedule visit with CDE for two weeks

## 2014-04-24 NOTE — Assessment & Plan Note (Addendum)
BP Readings from Last 3 Encounters:  04/24/14 132/68  10/23/13 116/77  10/16/13 112/70    Lab Results  Component Value Date   NA 142 09/12/2012   K 5.7* 12/22/2012   CREATININE 11.10* 07/11/2012    Assessment: Blood pressure control: controlled Progress toward BP goal:  at goal Comments: none  Plan: Medications:  continue current medications Educational resources provided:   Self management tools provided:   Other plans: none

## 2014-04-24 NOTE — Assessment & Plan Note (Signed)
Lab Results  Component Value Date   HGBA1C 5.9 04/24/2014   HGBA1C 8.6 08/23/2013   HGBA1C 9.1 05/24/2013     Assessment: Diabetes control: fair control Progress toward A1C goal:  at goal Comments: While A1c is good at 5.9, his CBG was 209 today and his wife notes daily fluctuations at home. His meter broke so no record but likely volatile. As per HPI, [facility] renal team transitioned him to insulin from glipizide and sitagliptin and have been titrating. No symptomatic hypoglycemia but CBG has been as low as 50s in the morning. Patient also asks for script for new diabetic shoes. Patient overdue for eye exam which has been neglected in setting of ESRD treatment.  Plan: Medications:  Continue novolog 10 u TID w meals. Will decrease lantus to 45 u at night, give new meter, and reassess in one week. Also told  patient to bring form for diabetic shoe request Home glucose monitoring: Frequency:   Timing:   Instruction/counseling given: reminded to get eye exam, reminded to bring blood glucose meter & log to each visit, reminded to bring medications to each visit and discussed foot care Educational resources provided:   Butch Penny spoke with patient about insulin administration. Self management tools provided:   Other plans: Will reassess in 1-2 weeks

## 2014-04-24 NOTE — Patient Instructions (Signed)
It was a pleasure to meet you today. I am so glad to hear that your kidney transplant went well. We have decreased your lantus from 50 u to 45 u at night and will continue the novolog at 10 u with meals. Please use the glucometer you received today and bring it to your appointment in 1-2 weeks. Also use the ointment and shampoo and let us know if you notice improvement. Please return to clinic or seek medical attention if you have new or worsening lightheadedness, loss of consciousness, wide range of sugars, or other worrisome medical condition.

## 2014-04-24 NOTE — Assessment & Plan Note (Signed)
Patient has history that was successfully treated with ketoconazole shampoo and triamcinolone cream but has returned after discontinuing medications. Will restart and consider dermatology referral if he fails treatment.

## 2014-04-24 NOTE — Assessment & Plan Note (Signed)
See HPI. Currently managed by [facility] renal team.

## 2014-04-25 NOTE — Progress Notes (Signed)
I saw and evaluated the patient.  I personally confirmed the key portions of the history and exam documented by Dr. Ethelene Hal and I reviewed pertinent patient test results.  The assessment, diagnosis, and plan were formulated together and I agree with the documentation in the resident's note.

## 2014-05-01 ENCOUNTER — Ambulatory Visit: Payer: Medicare Other | Admitting: Internal Medicine

## 2014-05-02 DIAGNOSIS — D899 Disorder involving the immune mechanism, unspecified: Secondary | ICD-10-CM | POA: Diagnosis not present

## 2014-05-02 DIAGNOSIS — Z48298 Encounter for aftercare following other organ transplant: Secondary | ICD-10-CM | POA: Diagnosis not present

## 2014-05-02 DIAGNOSIS — Z940 Kidney transplant status: Secondary | ICD-10-CM | POA: Diagnosis not present

## 2014-05-09 ENCOUNTER — Ambulatory Visit (INDEPENDENT_AMBULATORY_CARE_PROVIDER_SITE_OTHER): Payer: Medicare Other | Admitting: Internal Medicine

## 2014-05-09 ENCOUNTER — Encounter: Payer: Self-pay | Admitting: Internal Medicine

## 2014-05-09 VITALS — BP 136/61 | HR 80 | Temp 98.0°F | Ht 65.0 in | Wt 174.1 lb

## 2014-05-09 DIAGNOSIS — E1169 Type 2 diabetes mellitus with other specified complication: Secondary | ICD-10-CM

## 2014-05-09 DIAGNOSIS — E11311 Type 2 diabetes mellitus with unspecified diabetic retinopathy with macular edema: Secondary | ICD-10-CM

## 2014-05-09 DIAGNOSIS — E118 Type 2 diabetes mellitus with unspecified complications: Principal | ICD-10-CM

## 2014-05-09 DIAGNOSIS — E1139 Type 2 diabetes mellitus with other diabetic ophthalmic complication: Secondary | ICD-10-CM | POA: Diagnosis not present

## 2014-05-09 DIAGNOSIS — N186 End stage renal disease: Secondary | ICD-10-CM | POA: Diagnosis not present

## 2014-05-09 DIAGNOSIS — E785 Hyperlipidemia, unspecified: Secondary | ICD-10-CM | POA: Diagnosis not present

## 2014-05-09 DIAGNOSIS — IMO0002 Reserved for concepts with insufficient information to code with codable children: Secondary | ICD-10-CM

## 2014-05-09 DIAGNOSIS — E1165 Type 2 diabetes mellitus with hyperglycemia: Secondary | ICD-10-CM | POA: Diagnosis not present

## 2014-05-09 DIAGNOSIS — E11359 Type 2 diabetes mellitus with proliferative diabetic retinopathy without macular edema: Secondary | ICD-10-CM | POA: Diagnosis not present

## 2014-05-09 DIAGNOSIS — E11319 Type 2 diabetes mellitus with unspecified diabetic retinopathy without macular edema: Secondary | ICD-10-CM

## 2014-05-09 DIAGNOSIS — K044 Acute apical periodontitis of pulpal origin: Secondary | ICD-10-CM | POA: Diagnosis not present

## 2014-05-09 DIAGNOSIS — I120 Hypertensive chronic kidney disease with stage 5 chronic kidney disease or end stage renal disease: Secondary | ICD-10-CM | POA: Diagnosis not present

## 2014-05-09 DIAGNOSIS — I428 Other cardiomyopathies: Secondary | ICD-10-CM | POA: Diagnosis not present

## 2014-05-09 NOTE — Progress Notes (Signed)
Patient ID: [name], male   DOB: 10-01-1970, 43 y.o.   MRN: [id number] Diet education on increasing magnesium in diet done today per request of patient and spouse.

## 2014-05-09 NOTE — Assessment & Plan Note (Signed)
Lab Results  Component Value Date   HGBA1C 5.9 04/24/2014   HGBA1C 8.6 08/23/2013   HGBA1C 9.1 05/24/2013     Assessment: Diabetes control:  Controlled, last Hgb A1C of 5.9% Progress toward A1C goal:   At goal Comments: He continues to have morning lows (50-80s per his CBG meter) with before dinner highs in the 240-300s range. He is followed by [facility] and tells me that they are concerned about decreasing his long-acting insulin to prevent sudden changes in his BS which may affect his kidney transplant.   Plan: Medications:  Continue Lantus 45 units qHS, change Novolog to 10 units with breakfast, 14 units with lunch, and 5 units with dinner (his smallest meal). He was advised to decrease the lunch Novolog to 12 units if the before dinner BS is <80.  Home glucose monitoring: Frequency:   Timing:   Instruction/counseling given: reminded to get eye exam Educational resources provided: brochure;handout Self management tools provided: copy of home glucose meter download Other plans: He has appointment with [facility] Ophthalmology here in [place] on 10/20. He has follow up appointment with [facility] transplant team in 2-3 weeks. He will follow up with Dr. Ethelene Hal on 11/2. His diabetic shoes form were already filled and faxed to the delivery company.

## 2014-05-09 NOTE — Assessment & Plan Note (Signed)
He has appointment with [facility] Ophthalmology at [facility] on 10/20.

## 2014-05-09 NOTE — Assessment & Plan Note (Signed)
Doing well per his most recent note from the transplant team. He has follow up at  [facility] in 2-3 weeks.  -Will forward a copy of today's visit to Dr. Annalee Genta at [facility].

## 2014-05-09 NOTE — Patient Instructions (Addendum)
-  Start using Novolog 10 units for breakfast, 14 units for lunch, and 5 units for dinner. If the before dinner blood sugar is less than 80 you may decreased the Novolog 14 units at lunch to 12 units.  -Continue using Lantus 45 units at bedtime.  -Keep your eye appointment with [facility] Ophthalmology clinic on [address] on October 20th at [facility].  -Follow up with [facility] in 2-3 weeks.  -Follow up with Dr. Ethelene Hal on November 2nd.    Please bring your medicines with you each time you come.   Medicines may be  Eye drops  Herbal   Vitamins  Pills  Seeing these help [place] take care of you.

## 2014-05-09 NOTE — Progress Notes (Signed)
   Subjective:    Patient ID: [name], male    DOB: 17-Apr-1971, 43 y.o.   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 Since his last visit he still has had low BS in the 60-80s range in the mornings before breakfast despite decreasing the dose of Lantus to 45 units form 50units qHS. He is symptomatic during these hypoglycemic events with feeling of being very hot but denies blurry vision, confusion, or weakness.  He explains that he eats a very small dinner late at night and he is concerned the Novolog 10 units combined with the bedtime Lantus are too much for him. He had a very small dinner last night and did not use Novolog but used the Lantus 45 units, his BS this morning is 86.   Review of Systems  Constitutional: Negative for fever, chills, diaphoresis, activity change, appetite change, fatigue and unexpected weight change.  Respiratory: Negative for cough.   Cardiovascular: Negative for chest pain.  Gastrointestinal: Negative for abdominal pain.  Endocrine: Negative for polyuria.  Genitourinary: Negative for dysuria.  Neurological: Negative for dizziness, weakness and light-headedness.  Psychiatric/Behavioral: Negative for agitation.       Objective:   Physical Exam  Nursing note and vitals reviewed. Constitutional: He is oriented to person, place, and time. He appears well-developed and well-nourished. No distress.  Cardiovascular: Normal rate.   Pulmonary/Chest: Effort normal. No respiratory distress.  Neurological: He is alert and oriented to person, place, and time. Coordination normal.  Skin: Skin is warm and dry. He is not diaphoretic.  Psychiatric: He has a normal mood and affect. His behavior is normal.          Assessment & Plan:

## 2014-05-13 NOTE — Progress Notes (Signed)
Medicine attending: Medical history, presenting problems, physical findings, and medications, reviewed with resident physician Dr. Hayes Ludwig on the day of the patient's clinic visit and I concurred with her evaluation and management plan. Murriel Hopper, M.D., [place]

## 2014-05-22 ENCOUNTER — Other Ambulatory Visit: Payer: Self-pay | Admitting: *Deleted

## 2014-05-22 DIAGNOSIS — E1122 Type 2 diabetes mellitus with diabetic chronic kidney disease: Secondary | ICD-10-CM

## 2014-05-22 NOTE — Telephone Encounter (Signed)
Needs new Rx for Accu-chek fastclix lancets with new ICD 10 code to [facility]. Hilda Blades Jalasia Eskridge RN 05/22/14 10:20AM

## 2014-05-23 ENCOUNTER — Other Ambulatory Visit: Payer: Self-pay | Admitting: Internal Medicine

## 2014-05-23 DIAGNOSIS — E1122 Type 2 diabetes mellitus with diabetic chronic kidney disease: Secondary | ICD-10-CM

## 2014-05-23 MED ORDER — ACCU-CHEK FASTCLIX LANCETS MISC
Status: DC
Start: 2014-05-23 — End: 2014-05-23

## 2014-05-23 MED ORDER — LANCETS 30G MISC: Status: DC

## 2014-05-23 NOTE — Telephone Encounter (Signed)
Patient's wife called back. She has spoken with pharmacy who is waiting for dx code so patient's insurance will cover his lancets. He is out of them and would like more today.

## 2014-05-24 MED ORDER — FREESTYLE LANCETS MISC: Status: DC

## 2014-05-24 NOTE — Telephone Encounter (Signed)
Patient's wife called again. [place] gave them accu chek lancets, they need the freestyle lancets. She requests another rx be sent per [facility]'s request

## 2014-05-30 DIAGNOSIS — I10 Essential (primary) hypertension: Secondary | ICD-10-CM | POA: Diagnosis not present

## 2014-05-30 DIAGNOSIS — N186 End stage renal disease: Secondary | ICD-10-CM | POA: Diagnosis not present

## 2014-05-30 DIAGNOSIS — Z48298 Encounter for aftercare following other organ transplant: Secondary | ICD-10-CM | POA: Diagnosis not present

## 2014-05-30 DIAGNOSIS — D696 Thrombocytopenia, unspecified: Secondary | ICD-10-CM | POA: Diagnosis not present

## 2014-05-30 DIAGNOSIS — Z79899 Other long term (current) drug therapy: Secondary | ICD-10-CM | POA: Diagnosis not present

## 2014-05-30 DIAGNOSIS — Z940 Kidney transplant status: Secondary | ICD-10-CM | POA: Diagnosis not present

## 2014-05-30 DIAGNOSIS — E119 Type 2 diabetes mellitus without complications: Secondary | ICD-10-CM | POA: Diagnosis not present

## 2014-05-30 DIAGNOSIS — D899 Disorder involving the immune mechanism, unspecified: Secondary | ICD-10-CM | POA: Diagnosis not present

## 2014-05-30 DIAGNOSIS — Z794 Long term (current) use of insulin: Secondary | ICD-10-CM | POA: Diagnosis not present

## 2014-05-30 DIAGNOSIS — E872 Acidosis: Secondary | ICD-10-CM | POA: Diagnosis not present

## 2014-06-04 DIAGNOSIS — E11311 Type 2 diabetes mellitus with unspecified diabetic retinopathy with macular edema: Secondary | ICD-10-CM | POA: Diagnosis not present

## 2014-06-04 DIAGNOSIS — Z79899 Other long term (current) drug therapy: Secondary | ICD-10-CM | POA: Diagnosis not present

## 2014-06-04 DIAGNOSIS — Z940 Kidney transplant status: Secondary | ICD-10-CM | POA: Diagnosis not present

## 2014-06-04 DIAGNOSIS — E11359 Type 2 diabetes mellitus with proliferative diabetic retinopathy without macular edema: Secondary | ICD-10-CM | POA: Diagnosis not present

## 2014-06-04 DIAGNOSIS — E1065 Type 1 diabetes mellitus with hyperglycemia: Secondary | ICD-10-CM | POA: Diagnosis not present

## 2014-06-13 DIAGNOSIS — Z48298 Encounter for aftercare following other organ transplant: Secondary | ICD-10-CM | POA: Diagnosis not present

## 2014-06-13 DIAGNOSIS — Z940 Kidney transplant status: Secondary | ICD-10-CM | POA: Diagnosis not present

## 2014-06-17 ENCOUNTER — Ambulatory Visit: Payer: Medicare Other | Admitting: Internal Medicine

## 2014-06-27 DIAGNOSIS — E1065 Type 1 diabetes mellitus with hyperglycemia: Secondary | ICD-10-CM | POA: Diagnosis not present

## 2014-06-27 DIAGNOSIS — D8989 Other specified disorders involving the immune mechanism, not elsewhere classified: Secondary | ICD-10-CM | POA: Diagnosis not present

## 2014-06-27 DIAGNOSIS — D649 Anemia, unspecified: Secondary | ICD-10-CM | POA: Diagnosis not present

## 2014-06-27 DIAGNOSIS — I10 Essential (primary) hypertension: Secondary | ICD-10-CM | POA: Diagnosis not present

## 2014-06-27 DIAGNOSIS — D696 Thrombocytopenia, unspecified: Secondary | ICD-10-CM | POA: Diagnosis not present

## 2014-06-27 DIAGNOSIS — Z940 Kidney transplant status: Secondary | ICD-10-CM | POA: Diagnosis not present

## 2014-06-27 DIAGNOSIS — I120 Hypertensive chronic kidney disease with stage 5 chronic kidney disease or end stage renal disease: Secondary | ICD-10-CM | POA: Diagnosis not present

## 2014-06-27 DIAGNOSIS — E118 Type 2 diabetes mellitus with unspecified complications: Secondary | ICD-10-CM | POA: Diagnosis not present

## 2014-07-04 ENCOUNTER — Ambulatory Visit (INDEPENDENT_AMBULATORY_CARE_PROVIDER_SITE_OTHER): Payer: Medicare Other | Admitting: Internal Medicine

## 2014-07-04 VITALS — BP 117/47 | HR 81 | Temp 98.1°F | Ht 65.0 in | Wt 173.6 lb

## 2014-07-04 DIAGNOSIS — Z794 Long term (current) use of insulin: Secondary | ICD-10-CM

## 2014-07-04 DIAGNOSIS — I10 Essential (primary) hypertension: Secondary | ICD-10-CM | POA: Diagnosis not present

## 2014-07-04 DIAGNOSIS — E11351 Type 2 diabetes mellitus with proliferative diabetic retinopathy with macular edema: Secondary | ICD-10-CM

## 2014-07-04 DIAGNOSIS — E11311 Type 2 diabetes mellitus with unspecified diabetic retinopathy with macular edema: Secondary | ICD-10-CM

## 2014-07-04 LAB — GLUCOSE, CAPILLARY
Glucose-Capillary: 178 mg/dL — ABNORMAL HIGH (ref 70–99)
Glucose-Capillary: 206 mg/dL — ABNORMAL HIGH (ref 70–99)

## 2014-07-04 LAB — POCT GLYCOSYLATED HEMOGLOBIN (HGB A1C): Hemoglobin A1C: 7.5

## 2014-07-04 NOTE — Progress Notes (Signed)
   Subjective:    Patient ID: [name], male    DOB: 11/14/1970, 43 y.o.   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 He has no specific complaints. Please see problem-based charting assessment and plan note for further details of medical issues addressed at today's visit.  Review of Systems  Constitutional: Negative for fever, chills and diaphoresis.  Respiratory: Negative for shortness of breath.   Cardiovascular: Negative for chest pain and palpitations.  Gastrointestinal: Negative for nausea, vomiting, abdominal pain, diarrhea and constipation.  Neurological: Negative for dizziness, weakness, numbness and headaches.       Objective:   Physical Exam  Constitutional: He is oriented to person, place, and time. He appears well-developed and well-nourished. No distress.  HENT:  Head: Normocephalic and atraumatic.  Mouth/Throat: Oropharynx is clear and moist.  Eyes: EOM are normal. Pupils are equal, round, and reactive to light.  Cardiovascular: Normal rate, regular rhythm, normal heart sounds and intact distal pulses.  Exam reveals no gallop and no friction rub.   No murmur heard. Pulmonary/Chest: Effort normal and breath sounds normal. No respiratory distress. He has no wheezes.  Abdominal: Soft. Bowel sounds are normal. He exhibits no distension. There is no tenderness.  L and R vertical incisions. L sided kidney transplant palpable  Neurological: He is alert and oriented to person, place, and time.  Skin: He is not diaphoretic.          Assessment & Plan:

## 2014-07-04 NOTE — Assessment & Plan Note (Signed)
BP Readings from Last 3 Encounters:  07/04/14 117/47  05/09/14 136/61  04/24/14 132/68    Lab Results  Component Value Date   NA 142 09/12/2012   K 5.7* 12/22/2012   CREATININE 11.10* 07/11/2012    Assessment: Blood pressure control: controlled Progress toward BP goal:  at goal Comments: none  Plan: Medications:  continue current medications carvedilol 25 mg daily Educational resources provided:   Self management tools provided:   Other plans: none

## 2014-07-04 NOTE — Progress Notes (Signed)
Internal Medicine Clinic Attending  I saw and evaluated the patient.  I personally confirmed the key portions of the history and exam documented by Dr. Rothman and I reviewed pertinent patient test results.  The assessment, diagnosis, and plan were formulated together and I agree with the documentation in the resident's note. 

## 2014-07-04 NOTE — Patient Instructions (Signed)
It was a pleasure to see you today. Please keep taking your medications as is. Please return to clinic or seek medical attention if you have any new or worsening chest pain, trouble breathing, or other worrisome medical condition. We look forward to seeing you again in 3 months.  Lottie Mussel, MD  General Instructions:   Please try to bring all your medicines next time. This will help [place] keep you safe from mistakes.   Progress Toward Treatment Goals:  Treatment Goal 07/04/2014  Hemoglobin A1C deteriorated  Blood pressure at goal    Self Care Goals & Plans:  Self Care Goal 07/04/2014  Manage my medications bring my medications to every visit; take my medicines as prescribed; refill my medications on time  Monitor my health keep track of my blood glucose; bring my glucose meter and log to each visit  Eat healthy foods eat more vegetables; eat foods that are low in salt  Be physically active find an activity I enjoy; take a walk every day  Meeting treatment goals -    Home Blood Glucose Monitoring 08/23/2013  Check my blood sugar once a day  When to check my blood sugar before breakfast     Care Management & Community Referrals:  Referral 08/23/2013  Referrals made for care management support diabetes educator

## 2014-07-04 NOTE — Assessment & Plan Note (Addendum)
Lab Results  Component Value Date   HGBA1C 7.5 07/04/2014   HGBA1C 5.9 04/24/2014   HGBA1C 8.6 08/23/2013     Assessment: Diabetes control: fair control Progress toward A1C goal:  deteriorated Comments: He says he has not been as good at eating a balanced diet since his wife underwent surgery a few weeks ago. She also administers his insulin so he has missed some doses. He has his meter but it could not download. He thinks CBG has generally been in 100s. No episodes of hypoglycemia. He saw [facility] since last visit. He also thinks the delay in getting his shoe insoles has kept him less active. We will not make changes for now as his recently high sugars are explained by his wife's surgery and recovery and once he gets his insoles he will be more active.  Plan: Medications:  continue current medications lantus 45 u , novolog 10 u breakfast 14 u lunch 5 u supper Home glucose monitoring: Frequency:   Timing:   Instruction/counseling given: reminded to bring blood glucose meter & log to each visit and reminded to bring medications to each visit Educational resources provided:   Self management tools provided: copy of home glucose meter download Other plans: request ophtho visit note  ------------- ADDENDUM: Ophtho note received from visit w Dr Mercer Pod on 06/04/14. 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 He was told to return in one month.  Lottie Mussel, MD 07/04/14 2:04 pm

## 2014-07-08 DIAGNOSIS — Z79899 Other long term (current) drug therapy: Secondary | ICD-10-CM | POA: Diagnosis not present

## 2014-07-08 DIAGNOSIS — Z940 Kidney transplant status: Secondary | ICD-10-CM | POA: Diagnosis not present

## 2014-07-09 DIAGNOSIS — E1065 Type 1 diabetes mellitus with hyperglycemia: Secondary | ICD-10-CM | POA: Diagnosis not present

## 2014-07-09 DIAGNOSIS — E11311 Type 2 diabetes mellitus with unspecified diabetic retinopathy with macular edema: Secondary | ICD-10-CM | POA: Diagnosis not present

## 2014-07-22 ENCOUNTER — Encounter: Payer: Self-pay | Admitting: *Deleted

## 2014-07-24 DIAGNOSIS — Z79899 Other long term (current) drug therapy: Secondary | ICD-10-CM | POA: Diagnosis not present

## 2014-07-24 DIAGNOSIS — Z940 Kidney transplant status: Secondary | ICD-10-CM | POA: Diagnosis not present

## 2014-07-25 ENCOUNTER — Encounter (HOSPITAL_COMMUNITY): Payer: Self-pay | Admitting: Surgery

## 2014-08-20 DIAGNOSIS — Z940 Kidney transplant status: Secondary | ICD-10-CM | POA: Diagnosis not present

## 2014-08-20 DIAGNOSIS — Z79899 Other long term (current) drug therapy: Secondary | ICD-10-CM | POA: Diagnosis not present

## 2014-09-19 DIAGNOSIS — I509 Heart failure, unspecified: Secondary | ICD-10-CM | POA: Diagnosis not present

## 2014-09-19 DIAGNOSIS — Z940 Kidney transplant status: Secondary | ICD-10-CM | POA: Diagnosis not present

## 2014-09-19 DIAGNOSIS — I132 Hypertensive heart and chronic kidney disease with heart failure and with stage 5 chronic kidney disease, or end stage renal disease: Secondary | ICD-10-CM | POA: Diagnosis not present

## 2014-09-19 DIAGNOSIS — Z794 Long term (current) use of insulin: Secondary | ICD-10-CM | POA: Diagnosis not present

## 2014-09-19 DIAGNOSIS — N186 End stage renal disease: Secondary | ICD-10-CM | POA: Diagnosis not present

## 2014-09-19 DIAGNOSIS — D638 Anemia in other chronic diseases classified elsewhere: Secondary | ICD-10-CM | POA: Diagnosis not present

## 2014-09-19 DIAGNOSIS — E11359 Type 2 diabetes mellitus with proliferative diabetic retinopathy without macular edema: Secondary | ICD-10-CM | POA: Diagnosis not present

## 2014-09-19 DIAGNOSIS — D8989 Other specified disorders involving the immune mechanism, not elsewhere classified: Secondary | ICD-10-CM | POA: Diagnosis not present

## 2014-09-19 DIAGNOSIS — Z4822 Encounter for aftercare following kidney transplant: Secondary | ICD-10-CM | POA: Diagnosis not present

## 2014-09-19 DIAGNOSIS — D696 Thrombocytopenia, unspecified: Secondary | ICD-10-CM | POA: Diagnosis not present

## 2014-09-19 DIAGNOSIS — Z79899 Other long term (current) drug therapy: Secondary | ICD-10-CM | POA: Diagnosis not present

## 2014-09-19 DIAGNOSIS — E1065 Type 1 diabetes mellitus with hyperglycemia: Secondary | ICD-10-CM | POA: Diagnosis not present

## 2014-09-19 DIAGNOSIS — E1122 Type 2 diabetes mellitus with diabetic chronic kidney disease: Secondary | ICD-10-CM | POA: Diagnosis not present

## 2014-09-19 DIAGNOSIS — E1140 Type 2 diabetes mellitus with diabetic neuropathy, unspecified: Secondary | ICD-10-CM | POA: Diagnosis not present

## 2014-10-07 ENCOUNTER — Encounter: Payer: Medicare Other | Admitting: Internal Medicine

## 2014-10-09 ENCOUNTER — Encounter: Payer: Medicare Other | Admitting: Internal Medicine

## 2014-10-22 ENCOUNTER — Other Ambulatory Visit: Payer: Self-pay | Admitting: *Deleted

## 2014-10-22 DIAGNOSIS — E11311 Type 2 diabetes mellitus with unspecified diabetic retinopathy with macular edema: Secondary | ICD-10-CM

## 2014-10-22 MED ORDER — INSULIN GLARGINE 100 UNIT/ML ~~LOC~~ SOLN: 45.0000 [IU] | Freq: Every day | SUBCUTANEOUS | Status: DC

## 2014-10-22 NOTE — Telephone Encounter (Signed)
Call from pharmacy at [facility] for refill on Lantus and dapsone. I tried to call back so I can place there pharmacy in pt's chart but the # I was given is not accepting calls.

## 2014-10-22 NOTE — Telephone Encounter (Signed)
It is not clear to me why he is on the dapsone and that we have been Rxing it. He needs to provide more info to me since PCP is out currently.   Needs appt with PCP for routine F/U next 60 days or [facility] res

## 2014-10-23 ENCOUNTER — Other Ambulatory Visit: Payer: Self-pay | Admitting: *Deleted

## 2014-10-23 DIAGNOSIS — E11311 Type 2 diabetes mellitus with unspecified diabetic retinopathy with macular edema: Secondary | ICD-10-CM

## 2014-10-23 MED ORDER — INSULIN GLARGINE 100 UNIT/ML ~~LOC~~ SOLN: 45.0000 [IU] | Freq: Every day | SUBCUTANEOUS | Status: DC

## 2014-10-23 MED ORDER — INSULIN GLARGINE 100 UNIT/ML SOLOSTAR PEN: 45.0000 [IU] | PEN_INJECTOR | Freq: Every day | SUBCUTANEOUS | Status: DC

## 2014-10-23 NOTE — Telephone Encounter (Signed)
If Dapsone is for kidney transplant then he needs to get it from the transplant team as that is very specialized care.

## 2014-10-23 NOTE — Telephone Encounter (Signed)
Pharmacy called and informed.  Pt does have scheduled visit 3/18 to clarify.

## 2014-10-23 NOTE — Telephone Encounter (Signed)
Sent pen. Doesn't look like he ever used pens before. Does he have pen needles and know how to use?

## 2014-10-23 NOTE — Telephone Encounter (Signed)
You did this refill yesterday but they are asking for Lantus solostar.  Can you resend and change in EPIC

## 2014-10-23 NOTE — Telephone Encounter (Signed)
I reviewed last transplant note and Dapsone not mentioned so another reason to ask refill from transplant.

## 2014-10-23 NOTE — Telephone Encounter (Signed)
Pt scheduled for March 18th with PCP.  Pt needs dapsone s/p kidney transplant.

## 2014-11-01 ENCOUNTER — Encounter: Payer: Self-pay | Admitting: Internal Medicine

## 2014-11-01 ENCOUNTER — Ambulatory Visit (INDEPENDENT_AMBULATORY_CARE_PROVIDER_SITE_OTHER): Payer: Medicare Other | Admitting: Internal Medicine

## 2014-11-01 VITALS — BP 129/77 | HR 76 | Temp 98.0°F | Ht 65.0 in | Wt 170.2 lb

## 2014-11-01 DIAGNOSIS — E1122 Type 2 diabetes mellitus with diabetic chronic kidney disease: Secondary | ICD-10-CM

## 2014-11-01 DIAGNOSIS — I10 Essential (primary) hypertension: Secondary | ICD-10-CM | POA: Diagnosis not present

## 2014-11-01 DIAGNOSIS — E1165 Type 2 diabetes mellitus with hyperglycemia: Secondary | ICD-10-CM | POA: Diagnosis not present

## 2014-11-01 DIAGNOSIS — Z794 Long term (current) use of insulin: Secondary | ICD-10-CM | POA: Diagnosis not present

## 2014-11-01 LAB — GLUCOSE, CAPILLARY: Glucose-Capillary: 324 mg/dL — ABNORMAL HIGH (ref 70–99)

## 2014-11-01 LAB — POCT GLYCOSYLATED HEMOGLOBIN (HGB A1C): Hemoglobin A1C: 9.5

## 2014-11-01 MED ORDER — INSULIN GLARGINE 100 UNIT/ML SOLOSTAR PEN: 43.0000 [IU] | PEN_INJECTOR | Freq: Every day | SUBCUTANEOUS | Status: DC

## 2014-11-01 NOTE — Patient Instructions (Addendum)
It was a pleasure to see you today. You will come back in a week with your meter. Please check your sugars 4 times a day until then. Bring your form for the shoe inserts. Please return to clinic or seek medical attention if you have any new or worsening trouble breathing, chest pain, or other worrisome medical condition. We look forward to seeing you again in one week.  Lottie Mussel, MD  General Instructions:   Please try to bring all your medicines next time. This will help [place] keep you safe from mistakes.   Progress Toward Treatment Goals:  Treatment Goal 11/01/2014  Hemoglobin A1C deteriorated  Blood pressure at goal    Self Care Goals & Plans:  Self Care Goal 11/01/2014  Manage my medications take my medicines as prescribed; bring my medications to every visit; refill my medications on time  Monitor my health bring my glucose meter and log to each visit; keep track of my blood glucose  Eat healthy foods eat more vegetables; eat foods that are low in salt  Be physically active take a walk every day  Meeting treatment goals -    Home Blood Glucose Monitoring 08/23/2013  Check my blood sugar once a day  When to check my blood sugar before breakfast     Care Management & Community Referrals:  Referral 08/23/2013  Referrals made for care management support diabetes educator

## 2014-11-01 NOTE — Progress Notes (Signed)
   Subjective:    Patient ID: [name], male    DOB: October 11, 1970, 44 y.o.   MRN: [id number]  HPI  [name] is a 44 year old man with ESRD s/p renal transplant, dilated cardiomyopathy, CM2, and HTN here for routine follow-up on his chronic conditions. Please see problem-based charting assessment and plan note for further details of medical issues addressed at today's visit.  Review of Systems  Constitutional: Negative for fever, chills and diaphoresis.  Respiratory: Negative for shortness of breath.   Cardiovascular: Negative for chest pain.  Gastrointestinal: Negative for nausea, vomiting, abdominal pain, diarrhea and constipation.  Neurological: Negative for dizziness, tremors, weakness, light-headedness and headaches.       Objective:   Physical Exam  Constitutional: He is oriented to person, place, and time. He appears well-developed and well-nourished. No distress.  HENT:  Head: Normocephalic and atraumatic.  Mouth/Throat: Oropharynx is clear and moist.  Eyes: EOM are normal. Pupils are equal, round, and reactive to light.  Cardiovascular: Normal rate, regular rhythm, normal heart sounds and intact distal pulses.  Exam reveals no gallop and no friction rub.   No murmur heard. Pulmonary/Chest: Effort normal and breath sounds normal. No respiratory distress. He has no wheezes.  Abdominal: Soft. Bowel sounds are normal. He exhibits no distension. There is no tenderness.  L and R vertical incisions. L sided kidney transplant palpable   Neurological: He is alert and oriented to person, place, and time.  Skin: He is not diaphoretic.  Vitals reviewed.         Assessment & Plan:

## 2014-11-01 NOTE — Progress Notes (Signed)
Internal Medicine Clinic Attending  Case discussed with Dr. Rothman at the time of the visit.  We reviewed the resident's history and exam and pertinent patient test results.  I agree with the assessment, diagnosis, and plan of care documented in the resident's note. 

## 2014-11-01 NOTE — Assessment & Plan Note (Signed)
Lab Results  Component Value Date   HGBA1C 9.5 11/01/2014   HGBA1C 7.5 07/04/2014   HGBA1C 5.9 04/24/2014     Assessment: Diabetes control: poor control (HgbA1C >9%) Progress toward A1C goal:  deteriorated Comments: Mr Sciullo A1c continues to worsen. He was supposed to be on lantus 45 u daily, novolog 10/14/5u per our records but he said that his kidney doctor changed him to lantus 43 u and novolog 10/20/4u. He does not have his meter today but they think the lowest he has had was in the 48s. His wife notes that he ate a handful of jelly beans on his way out the door. He is on post-transplant medicines that can cause hyperglycemia. His main concern is he had submitted a form for shoe inserts which was rejected and he and his wife are upset. Last foot exam 04/2014. Of note, it documents he has no history of foot ulcer but he says he previously had an ulcer on the R foot dorsal aspect.  Plan: Medications:  continue current medications Home glucose monitoring: Frequency:   Timing:   Instruction/counseling given: reminded to bring blood glucose meter & log to each visit, reminded to bring medications to each visit, discussed foot care and discussed diet Educational resources provided: handout Self management tools provided:   Other plans: No changes today. He will return in one week with meter so we can make informed decision on insulin changes. He is to bring shoe insert form before then so we can follow-up on it at next visit.

## 2014-11-01 NOTE — Assessment & Plan Note (Signed)
BP Readings from Last 3 Encounters:  11/01/14 129/77  07/04/14 117/47  05/09/14 136/61    Lab Results  Component Value Date   NA 142 09/12/2012   K 5.7* 12/22/2012   CREATININE 11.10* 07/11/2012    Assessment: Blood pressure control: controlled Progress toward BP goal:  at goal Comments: [name] is well controlled on coreg cr 10 mg daily  Plan: Medications:  continue current medications Educational resources provided:   Self management tools provided:   Other plans: none

## 2014-11-06 ENCOUNTER — Other Ambulatory Visit: Payer: Self-pay | Admitting: *Deleted

## 2014-11-06 DIAGNOSIS — E1122 Type 2 diabetes mellitus with diabetic chronic kidney disease: Secondary | ICD-10-CM

## 2014-11-06 DIAGNOSIS — E11319 Type 2 diabetes mellitus with unspecified diabetic retinopathy without macular edema: Secondary | ICD-10-CM

## 2014-11-06 MED ORDER — FREESTYLE LANCETS MISC: Status: DC

## 2014-11-06 MED ORDER — GLUCOSE BLOOD VI STRP: ORAL_STRIP | Status: DC

## 2014-11-11 ENCOUNTER — Other Ambulatory Visit: Payer: Self-pay | Admitting: Dietician

## 2014-11-11 ENCOUNTER — Ambulatory Visit (INDEPENDENT_AMBULATORY_CARE_PROVIDER_SITE_OTHER): Payer: Medicare Other | Admitting: Internal Medicine

## 2014-11-11 ENCOUNTER — Encounter: Payer: Self-pay | Admitting: Internal Medicine

## 2014-11-11 VITALS — BP 144/76 | HR 85 | Temp 97.9°F | Ht 65.0 in | Wt 168.9 lb

## 2014-11-11 DIAGNOSIS — N189 Chronic kidney disease, unspecified: Secondary | ICD-10-CM | POA: Diagnosis not present

## 2014-11-11 DIAGNOSIS — E1122 Type 2 diabetes mellitus with diabetic chronic kidney disease: Secondary | ICD-10-CM

## 2014-11-11 LAB — GLUCOSE, CAPILLARY: Glucose-Capillary: 487 mg/dL — ABNORMAL HIGH (ref 70–99)

## 2014-11-11 MED ORDER — GLUCOSE BLOOD VI STRP
ORAL_STRIP | Status: DC
Start: 2014-11-11 — End: 2014-12-19

## 2014-11-11 MED ORDER — FREESTYLE LANCETS MISC: Status: DC

## 2014-11-11 NOTE — Assessment & Plan Note (Signed)
Lab Results  Component Value Date   HGBA1C 9.5 11/01/2014   HGBA1C 7.5 07/04/2014   HGBA1C 5.9 04/24/2014     Assessment: Diabetes control: poor control (HgbA1C >9%) Progress toward A1C goal:  unable to assess Comments: He was last seen 11/01/14 in [facility] and his A1c had increased to 9.5 from 7.5 4 months prior. He did not have his meter at that time and was told to return in a week with his meter. He is also trying to secure diabetic shoes and in-soles (he has previous history of foot ulcer) and has had difficulty with the paperwork and was to bring this today. He brought his meter today but ran out of test strips the day of his last appointment. He therefore has not been checking his sugar at home the past week. His CBG today was 487. He is at risk for hyperglycemia due to immunosuppression medications. Tight glycemic control is important so that his new kidney does not become diseased. He is currently on lantus 43 u daily, novolog 10/20/4 u tidwc.  Plan: Medications:  continue current medications Home glucose monitoring: Frequency:   Timing:   Instruction/counseling given: reminded to bring blood glucose meter & log to each visit, reminded to bring medications to each visit and discussed foot care Educational resources provided:   Self management tools provided:   Other plans: Butch Penny met with him today to ensure that we order correct strips and understands how to test at home. We will not make changes today as we do not have any log and he has previous history of occasional hypoglycemia. I filled out the diabetic shoe paper work again and we will fax. He is to return to clinic in one week (or one week from when he gets his strips) with his meter for insulin titration.

## 2014-11-11 NOTE — Progress Notes (Signed)
   Subjective:    Patient ID: [name], male    DOB: [birth year], 44 y.o.   MRN: [id number]  HPI  Mr [name] is a 44 year old man with ESRD s/p renal transplant, dilated cardiomyopathy, DM2, HTN here for follow-up on DM2. Please see problem-based charting assessment and plan note for further details of medical issues addressed at today's visit.  Review of Systems  Constitutional: Negative for fever, chills and diaphoresis.  Respiratory: Negative for cough and shortness of breath.   Cardiovascular: Negative for chest pain.  Gastrointestinal: Negative for nausea, vomiting, abdominal pain, diarrhea and constipation.  Neurological: Negative for weakness, light-headedness, numbness and headaches.       Objective:   Physical Exam  Constitutional: He is oriented to person, place, and time. He appears well-developed and well-nourished. No distress.  HENT:  Head: Normocephalic and atraumatic.  Mouth/Throat: Oropharynx is clear and moist.  Eyes: EOM are normal. Pupils are equal, round, and reactive to light.  Cardiovascular: Normal rate, regular rhythm, normal heart sounds and intact distal pulses.  Exam reveals no gallop and no friction rub.   No murmur heard. Pulmonary/Chest: Effort normal and breath sounds normal. No respiratory distress. He has no wheezes.  Neurological: He is alert and oriented to person, place, and time.  Skin: He is not diaphoretic.  Vitals reviewed.         Assessment & Plan:

## 2014-11-11 NOTE — Telephone Encounter (Signed)
Having difficulty getting testing supplies for his diabetes. Needs rx called [facility]- they were sending their faxes to another doctor office [phone]. They said a new rx would help them fill his supplies.

## 2014-11-11 NOTE — Patient Instructions (Signed)
It was a pleasure to see you today. Please call us if issues getting the test strips. Please return to clinic or seek medical attention if you have any new or worsening trouble breathing, chest pain, or other worrisome medical condition. We look forward to seeing you again in one week (please call and move appointment to one week from when you get the strips. Remember to bring your meter!  Lottie Mussel, MD  General Instructions:   Please try to bring all your medicines next time. This will help [place] keep you safe from mistakes.   Progress Toward Treatment Goals:  Treatment Goal 11/11/2014  Hemoglobin A1C unable to assess  Blood pressure -    Self Care Goals & Plans:  Self Care Goal 11/11/2014  Manage my medications take my medicines as prescribed; bring my medications to every visit; refill my medications on time  Monitor my health bring my glucose meter and log to each visit; keep track of my blood glucose  Eat healthy foods eat more vegetables; eat foods that are low in salt  Be physically active take a walk every day  Meeting treatment goals -    Home Blood Glucose Monitoring 08/23/2013  Check my blood sugar once a day  When to check my blood sugar before breakfast     Care Management & Community Referrals:  Referral 08/23/2013  Referrals made for care management support diabetes educator

## 2014-11-12 NOTE — Progress Notes (Signed)
Internal Medicine Clinic Attending Date of visit: 11/11/2014  I saw and evaluated the patient.  I personally confirmed the key portions of the history and exam documented by Dr. Ethelene Hal and I reviewed pertinent patient test results.  The assessment, diagnosis, and plan were formulated together and I agree with the documentation in the resident's note.

## 2014-11-13 DIAGNOSIS — I129 Hypertensive chronic kidney disease with stage 1 through stage 4 chronic kidney disease, or unspecified chronic kidney disease: Secondary | ICD-10-CM | POA: Diagnosis not present

## 2014-11-13 DIAGNOSIS — Z940 Kidney transplant status: Secondary | ICD-10-CM | POA: Diagnosis not present

## 2014-11-13 DIAGNOSIS — E1129 Type 2 diabetes mellitus with other diabetic kidney complication: Secondary | ICD-10-CM | POA: Diagnosis not present

## 2014-11-14 ENCOUNTER — Encounter: Payer: Self-pay | Admitting: Nurse Practitioner

## 2014-11-14 DIAGNOSIS — E538 Deficiency of other specified B group vitamins: Secondary | ICD-10-CM | POA: Diagnosis not present

## 2014-11-14 DIAGNOSIS — Z940 Kidney transplant status: Secondary | ICD-10-CM | POA: Diagnosis not present

## 2014-11-14 DIAGNOSIS — N2581 Secondary hyperparathyroidism of renal origin: Secondary | ICD-10-CM | POA: Diagnosis not present

## 2014-11-18 ENCOUNTER — Telehealth: Payer: Self-pay | Admitting: *Deleted

## 2014-11-18 NOTE — Telephone Encounter (Signed)
I put custom for both the shoes and the in-sole

## 2014-11-18 NOTE — Telephone Encounter (Signed)
Pt's wife called and states wrong script went to [facility], needs to states " extra depth diabetic shoes"

## 2014-11-22 ENCOUNTER — Ambulatory Visit: Payer: Medicare Other | Admitting: Internal Medicine

## 2014-11-27 ENCOUNTER — Ambulatory Visit: Payer: Medicare Other | Admitting: Internal Medicine

## 2014-12-05 ENCOUNTER — Telehealth: Payer: Self-pay | Admitting: *Deleted

## 2014-12-05 NOTE — Telephone Encounter (Signed)
Had appointment with "shoe doctor" and didn't call, needs to get back in with Dr. Ethelene Hal to get his paperwork done.  We mad appointment with Dr. Heber Kingsbury?

## 2014-12-09 DIAGNOSIS — Z79899 Other long term (current) drug therapy: Secondary | ICD-10-CM | POA: Diagnosis not present

## 2014-12-12 ENCOUNTER — Encounter: Payer: Self-pay | Admitting: *Deleted

## 2014-12-19 ENCOUNTER — Other Ambulatory Visit: Payer: Self-pay | Admitting: *Deleted

## 2014-12-19 DIAGNOSIS — E1122 Type 2 diabetes mellitus with diabetic chronic kidney disease: Secondary | ICD-10-CM

## 2014-12-19 MED ORDER — FREESTYLE LANCETS MISC: Status: DC

## 2014-12-19 MED ORDER — GLUCOSE BLOOD VI STRP: ORAL_STRIP | Status: DC

## 2014-12-19 NOTE — Telephone Encounter (Signed)
Pt has moved to [employer] and per [employer] for these cant be transferred

## 2014-12-25 ENCOUNTER — Other Ambulatory Visit: Payer: Self-pay | Admitting: *Deleted

## 2014-12-25 DIAGNOSIS — E1122 Type 2 diabetes mellitus with diabetic chronic kidney disease: Secondary | ICD-10-CM

## 2014-12-25 MED ORDER — FREESTYLE LANCETS MISC: Status: DC

## 2014-12-25 MED ORDER — GLUCOSE BLOOD VI STRP: ORAL_STRIP | Status: DC

## 2014-12-25 NOTE — Telephone Encounter (Signed)
Need to resend.  Pharmacy never recieved

## 2014-12-31 ENCOUNTER — Other Ambulatory Visit: Payer: Self-pay | Admitting: Dietician

## 2014-12-31 ENCOUNTER — Telehealth: Payer: Self-pay | Admitting: *Deleted

## 2014-12-31 DIAGNOSIS — Z794 Long term (current) use of insulin: Secondary | ICD-10-CM | POA: Diagnosis not present

## 2014-12-31 DIAGNOSIS — D8989 Other specified disorders involving the immune mechanism, not elsewhere classified: Secondary | ICD-10-CM | POA: Diagnosis not present

## 2014-12-31 DIAGNOSIS — E1122 Type 2 diabetes mellitus with diabetic chronic kidney disease: Secondary | ICD-10-CM

## 2014-12-31 DIAGNOSIS — D696 Thrombocytopenia, unspecified: Secondary | ICD-10-CM | POA: Diagnosis not present

## 2014-12-31 DIAGNOSIS — N186 End stage renal disease: Secondary | ICD-10-CM | POA: Diagnosis not present

## 2014-12-31 DIAGNOSIS — D649 Anemia, unspecified: Secondary | ICD-10-CM | POA: Diagnosis not present

## 2014-12-31 DIAGNOSIS — Z940 Kidney transplant status: Secondary | ICD-10-CM | POA: Diagnosis not present

## 2014-12-31 DIAGNOSIS — Z4822 Encounter for aftercare following kidney transplant: Secondary | ICD-10-CM | POA: Diagnosis not present

## 2014-12-31 DIAGNOSIS — I120 Hypertensive chronic kidney disease with stage 5 chronic kidney disease or end stage renal disease: Secondary | ICD-10-CM | POA: Diagnosis not present

## 2014-12-31 DIAGNOSIS — E119 Type 2 diabetes mellitus without complications: Secondary | ICD-10-CM | POA: Diagnosis not present

## 2014-12-31 DIAGNOSIS — Z7982 Long term (current) use of aspirin: Secondary | ICD-10-CM | POA: Diagnosis not present

## 2014-12-31 MED ORDER — FREESTYLE LANCETS MISC: Status: DC

## 2014-12-31 NOTE — Telephone Encounter (Signed)
Needs lancets

## 2014-12-31 NOTE — Telephone Encounter (Signed)
Talked with wife - pt had a called from abd transplant clinic in [facility] - yearly FY - CBG 625. Wanted to admit pt - pt refuses. Appt given 01/01/15 9:15AM Dr Sherrine Maples - to bring meter. Pt is doing sling scale for CBG and doing insulin orders. If any change - suggest to go to ER. Has been out of test strips and lancets 6-7 weeks. Hilda Blades Natosha Bou RN 12/31/14 5PM

## 2015-01-01 ENCOUNTER — Encounter: Payer: Self-pay | Admitting: Internal Medicine

## 2015-01-01 ENCOUNTER — Ambulatory Visit (INDEPENDENT_AMBULATORY_CARE_PROVIDER_SITE_OTHER): Payer: Medicare Other | Admitting: Internal Medicine

## 2015-01-01 VITALS — BP 143/71 | HR 88 | Temp 98.2°F | Ht 65.0 in | Wt 164.4 lb

## 2015-01-01 DIAGNOSIS — E1165 Type 2 diabetes mellitus with hyperglycemia: Secondary | ICD-10-CM

## 2015-01-01 DIAGNOSIS — Z794 Long term (current) use of insulin: Secondary | ICD-10-CM

## 2015-01-01 DIAGNOSIS — E1110 Type 2 diabetes mellitus with ketoacidosis without coma: Secondary | ICD-10-CM

## 2015-01-01 LAB — GLUCOSE, CAPILLARY: GLUCOSE-CAPILLARY: 236 mg/dL — AB (ref 65–99)

## 2015-01-01 NOTE — Patient Instructions (Signed)
Your new insulin regimen:  Test your blood sugar 15 minutes BEFORE each meal. At that time, please take: 14 U novolog with breakfast 14 U novolog with lunch 14 U novolog with dinner 42 U lantus at night  If you would like to use a correction dose (sliding scale), ONLY take this correction dose with meals. For every sugar that is 50 points over 150, you can take 2 extra units of novolog.  Keep track of your sugar levels for 7 days, and return to clinic with that log for re-evaluation.   General Instructions:   Thank you for bringing your medicines today. This helps [place] keep you safe from mistakes.   Progress Toward Treatment Goals:  Treatment Goal 11/11/2014  Hemoglobin A1C unable to assess  Blood pressure -    Self Care Goals & Plans:  Self Care Goal 01/01/2015  Manage my medications take my medicines as prescribed; bring my medications to every visit; refill my medications on time  Monitor my health check my feet daily; bring my glucose meter and log to each visit; keep track of my blood glucose  Eat healthy foods eat more vegetables; eat foods that are low in salt; eat baked foods instead of fried foods  Be physically active take a walk every day  Meeting treatment goals -    Home Blood Glucose Monitoring 08/23/2013  Check my blood sugar once a day  When to check my blood sugar before breakfast     Care Management & Community Referrals:  Referral 08/23/2013  Referrals made for care management support diabetes educator

## 2015-01-03 NOTE — Assessment & Plan Note (Signed)
Lab Results  Component Value Date   HGBA1C 9.5 11/01/2014   HGBA1C 7.5 07/04/2014   HGBA1C 5.9 04/24/2014     Assessment: Diabetes control: very poor   Progress toward A1C goal: not at goal   Comments: Recent difficulty due to delay in getting lancets and test strips.  Plan: Medications:  Changing insulin dosing (see below) Instruction/counseling given: reminded to bring blood glucose meter & log to each visit Educational resources provided: food pamphlet   Self management tools provided: wrote out insulin regimen and timing of insulin dosing   Other plans:   - New finger stick and insulin dosing plan  Novolog 14 U 15 minutes before breakfast  Novolog 14 U 15 minutes before lunch  Novolog 14 U 15 minutes before dinner  Lantus 42 U before bedtime - For now, wife prefers to not use correction (sliding scale) dosing for simplicity. Could consider 2 U for every 50 points over a level of 150 in the future - Patient to record finger sticks over next week and will return for reassessment - Patient to call with any levels out of range

## 2015-01-03 NOTE — Progress Notes (Signed)
Internal Medicine Clinic Attending  Case discussed with Dr. Mallory soon after the resident saw the patient.  We reviewed the resident's history and exam and pertinent patient test results.  I agree with the assessment, diagnosis, and plan of care documented in the resident's note. 

## 2015-01-03 NOTE — Progress Notes (Signed)
Subjective:   Patient ID: [name] male   DOB: August 12, 1971 44 y.o.   MRN: [id number]  HPI: [name] is a 44 y.o. man with a history of DM2, ESRD s/p renal transplant, dilated cardiomyopathy and hypertension here at the encouragement of his transplant clinic in [facility] for a CBG of 625 during his clinic visit yesterday. He was asymptomatic other than some general fatigue. The transplant clinic personnel encouraged him to go to the ED yesterday, but he refused.   Instead, his wife managed his sugars by giving him continually more insulin on a sliding scale. All told, he got between 50 and 60 Units of novolog three times over the course of several hours along with his nightly lantus 42 Units. Not surprisingly, this brought down his blood glucose to the 100s by the time he went to sleep, then resulted in a symptomatic hypoglycemic episode of 30 at 3AM. This was treated with glucose tabs.   At his most recent clinic appointment his A1c was >9%. He had some difficulty around that time obtaining his lancets and test strips, and was therefore managing his diabetes blindly with a regimen of 10 U novolog after breakfast, 20 U novolog at lunch and 4 U novolog after dinner; he also was taking 42 U lantus before bed. This regimen was instituted by his renal transplant physician, according to the patient's wife. His supplies finally arrived over the weekend, and his levels ranged from 200-400 at home since.   The patient's meter did not download in clinic today. On arrival to clinic today, his CBG was 236.   Past Medical History  Diagnosis Date  . Non-ischemic cardiomyopathy     EF 20-25% 12/23/2008>>EF 40-45% 03/2009>>EF ??? 7/?/2011  . ESRD (end stage renal disease) on dialysis     1.Initiated HD via right per cate 12/30/2008 (MWF schedule at [facility]) 2.Left upper extremity A-V fistula by Dr Oneida Alar 12/30/2008 3.Aemia: Received Venofer 12/30/08 Ferritin 179 % sat 12 12/23/08, 4.  Secondary Hyperparathyroidism- PTH 188, P 5.4, Ca 9.2 12/2008  . 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 Hypertension     resloved after initating HD, now with post HD hypotension  . Hemodialysis-associated hypotension     coreg d/c'd  . Anemia   . CHF (congestive heart failure)   . Complication of anesthesia   . PONV (postoperative nausea and vomiting)   . History of blood transfusion   . Psoriasis     scalp   Current Outpatient Prescriptions  Medication Sig Dispense Refill  . aspirin EC 81 MG tablet Take 81 mg by mouth daily.    . B Complex-C-Folic Acid (RENA-VITE PO) Take 1 tablet by mouth daily.    . Blood Glucose Monitoring Suppl (ACCU-CHEK NANO SMARTVIEW) W/DEVICE KIT Use as instructed 1 kit 0  . calcium carbonate (OS-CAL - DOSED IN MG OF ELEMENTAL CALCIUM) 1250 MG tablet Take 1 tablet by mouth 3 (three) times daily with meals.    . carvedilol (COREG CR) 10 MG 24 hr capsule Take 10 mg by mouth daily.    . dapsone 25 MG tablet Take 50 mg by mouth daily.    Marland Kitchen docusate sodium (DULCOLAX) 100 MG capsule Take 100 mg by mouth daily as needed. For constipation    . glucose blood (FREESTYLE LITE) test strip Check blood sugar 4  times a day before meals and bed. diag code E11.9. Insulin dependent 125 each 2  . HYDROcodone-acetaminophen (NORCO/VICODIN) 5-325 MG per tablet Take 1 tablet by mouth every 8 (eight) hours as needed for moderate pain. 20 tablet 0  . insulin aspart (NOVOLOG) 100 UNIT/ML injection Inject into the skin 3 (three) times daily with meals. 10 u breakfast, 20 u lunch, 4 u dinner    . Insulin Glargine (LANTUS) 100 UNIT/ML Solostar Pen Inject 43 Units into the skin at bedtime. 15 mL 11  . ketoconazole (NIZORAL) 2 % shampoo Apply topically 2 (two) times a week. Wash hair and beard.  Leave shampoo on skin for 5 mins before washing off. 120 mL 1  .  Lancets (FREESTYLE) lancets Check blood sugar 4 times a day before meals and bed. diag code E11.9. Insulin dependent 200 each 5  . lovastatin (MEVACOR) 20 MG tablet Take 1 tablet (20 mg total) by mouth daily. 30 tablet 11  . mycophenolate (MYFORTIC) 180 MG EC tablet Take 720 mg by mouth 2 (two) times daily.    Marland Kitchen omeprazole (PRILOSEC) 20 MG capsule Take 20 mg by mouth daily.    . predniSONE (DELTASONE) 5 MG tablet Take 5 mg by mouth daily with breakfast.    . promethazine (PHENERGAN) 25 MG tablet Take 25 mg by mouth every 8 (eight) hours as needed. For nausea/vomiting    . tacrolimus (PROGRAF) 1 MG capsule Take 8 mg by mouth 2 (two) times daily.    Marland Kitchen triamcinolone (KENALOG) 0.025 % ointment Apply topically 2 (two) times daily. Apply to nose and beard, in areas of dry skin 80 g 1  . valGANciclovir (VALCYTE) 450 MG tablet Take 450 mg by mouth every Monday, Wednesday, and Friday.     No current facility-administered medications for this visit.   Family History  Problem Relation Age of Onset  . Hypertension Mother   . Diabetes Father   . Kidney disease Father   . Other Father     amputation  . Coronary artery disease Neg Hx     no hx of CHF, sudden cardiac death, or CAD  . Hypertension Brother   . Other Brother     bleeding problems   History   Social History  . Marital Status: Married    Spouse Name: N/A  . Number of Children: N/A  . Years of Education: N/A   Social History Main Topics  . Smoking status: Never Smoker   . Smokeless tobacco: Never Used  . Alcohol Use: No  . Drug Use: No  . Sexual Activity: Not on file   Other Topics Concern  . None   Social History Narrative   Has two children. No risky sexual behavior.   Review of Systems: General: no recent illness, no change in activity, was sweaty and tired during hypoglycemic episode Skin: no rashes or lesions HEENT: no headaches or blurred vision Cardiac: no chest pain or palpitations Respiratory: no shortness of  breath GI: no changes, no pain Urinary: no changes Msk: no changes Psychiatric: no changes  Objective:  Physical Exam: Filed Vitals:   01/01/15 0946  BP: 143/71  Pulse: 88  Temp: 98.2 F (36.8 C)  TempSrc: Oral  Height: 5' 5"  (1.651 m)  Weight: 164 lb 6.4 oz (74.571 kg)  SpO2: 100%   Appearance: in NAD, allows wife to answer majority of questions about prior day's events, appears somewhat apathetic HEENT: AT/[place], PERRL, EOMi, no diaphoresis Heart: RRR, normal S1S2, no murmurs Lungs:  CTAB, no wheezes Abdomen: BS+, soft, nontender Musculoskeletal: normal ROM, no edema Neurologic: A&Ox3, normal sensation, grossly intact Skin: no rashes or lesions  Assessment & Plan:   Please see problem oriented charting for assessment & plan  Venita Lick, MD

## 2015-01-07 ENCOUNTER — Telehealth: Payer: Self-pay | Admitting: Internal Medicine

## 2015-01-07 NOTE — Telephone Encounter (Signed)
Call to patient to confirm appointment for 01/08/15 at 9:15 both numbers are not working

## 2015-01-08 ENCOUNTER — Encounter: Payer: Self-pay | Admitting: Internal Medicine

## 2015-01-08 ENCOUNTER — Ambulatory Visit (INDEPENDENT_AMBULATORY_CARE_PROVIDER_SITE_OTHER): Payer: Medicare Other | Admitting: Internal Medicine

## 2015-01-08 VITALS — BP 134/64 | HR 80 | Temp 98.5°F | Ht 65.0 in | Wt 170.2 lb

## 2015-01-08 DIAGNOSIS — Z794 Long term (current) use of insulin: Secondary | ICD-10-CM | POA: Diagnosis not present

## 2015-01-08 DIAGNOSIS — I10 Essential (primary) hypertension: Secondary | ICD-10-CM | POA: Diagnosis present

## 2015-01-08 DIAGNOSIS — E1122 Type 2 diabetes mellitus with diabetic chronic kidney disease: Secondary | ICD-10-CM

## 2015-01-08 DIAGNOSIS — IMO0002 Reserved for concepts with insufficient information to code with codable children: Secondary | ICD-10-CM

## 2015-01-08 DIAGNOSIS — E1165 Type 2 diabetes mellitus with hyperglycemia: Secondary | ICD-10-CM

## 2015-01-08 DIAGNOSIS — K047 Periapical abscess without sinus: Secondary | ICD-10-CM

## 2015-01-08 LAB — GLUCOSE, CAPILLARY: GLUCOSE-CAPILLARY: 165 mg/dL — AB (ref 65–99)

## 2015-01-08 MED ORDER — INSULIN GLARGINE 100 UNIT/ML SOLOSTAR PEN: 42.0000 [IU] | PEN_INJECTOR | Freq: Every day | SUBCUTANEOUS | Status: DC

## 2015-01-08 MED ORDER — INSULIN ASPART 100 UNIT/ML ~~LOC~~ SOLN: 14.0000 [IU] | Freq: Three times a day (TID) | SUBCUTANEOUS | Status: DC

## 2015-01-08 NOTE — Progress Notes (Signed)
[name] Subjective:   Patient ID: [name] male   DOB: 04/28/1971 44 y.o.   MRN: [id number]  HPI: [name] is a 44 y.o. male with a history of DM2, ESRD s/p renal transplant, dilated cardiomyopathy and hypertension who presents for follow up of his new diabetes regimen. He was seen here last week at the suggestion of his transplant clinic when his CBG resulted at 625 at that clinic. He was asymptomatic at the time, and his wife helped him manage his insulin that night. Unfortunately, he ended up receiving a very large amount of novolog along with his nightly lantus, and ended up stacking and developing symptomatic hypoglycemia with a finger stick of 30 in the middle of the night. The next day, during his clinic visit, his regimen was simplified to novolog 14 U 15 minutes before each meal and lantus 42 U before bedtime. His wife was uncomfortable with this plan without a sliding scale (as his levels were so volatile the week prior), so a sliding scale was added (2U for every 50 points over 150).  The patient reports good compliance over the past 5 days with this new regimen; the sliding scale was put to use. His meter will not download in clinic today, but his wife kept paper records of his finger sticks which ranged from 61 to 290. Unfortunately, it appears that the lows were occurring after the sliding scale dosing was implemented. The highs generally occurred after the lows (and thus, after glucose packs were given). The patient was symptomatic during his lows this week.  CBG on arrival to clinic was 165.   Past Medical History  Diagnosis Date  . Non-ischemic cardiomyopathy     EF 20-25% 12/23/2008>>EF 40-45% 03/2009>>EF ??? 7/?/2011  . ESRD (end stage renal disease) on dialysis     1.Initiated HD via right per cate 12/30/2008 (MWF schedule at [facility]) 2.Left upper extremity A-V fistula by Dr Oneida Alar 12/30/2008 3.Aemia: Received Venofer 12/30/08  Ferritin 179 % sat 12 12/23/08, 4. Secondary Hyperparathyroidism- PTH 188, P 5.4, Ca 9.2 12/2008  . 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 Hypertension     resloved after initating HD, now with post HD hypotension  . Hemodialysis-associated hypotension     coreg d/c'd  . Anemia   . CHF (congestive heart failure)   . Complication of anesthesia   . PONV (postoperative nausea and vomiting)   . History of blood transfusion   . Psoriasis     scalp   Current Outpatient Prescriptions  Medication Sig Dispense Refill  . aspirin EC 81 MG tablet Take 81 mg by mouth daily.    . B Complex-C-Folic Acid (RENA-VITE PO) Take 1 tablet by mouth daily.    . Blood Glucose Monitoring Suppl (ACCU-CHEK NANO SMARTVIEW) W/DEVICE KIT Use as instructed 1 kit 0  . calcium carbonate (OS-CAL - DOSED IN MG OF ELEMENTAL CALCIUM) 1250 MG tablet Take 1 tablet by mouth 3 (three) times daily with meals.    . carvedilol (COREG CR) 10 MG 24 hr capsule Take 10 mg by mouth daily.    . dapsone 25 MG tablet Take 50 mg by mouth daily.    Marland Kitchen docusate sodium (DULCOLAX) 100 MG capsule Take 100 mg by mouth daily as needed. For constipation    . glucose blood (FREESTYLE LITE) test  strip Check blood sugar 4 times a day before meals and bed. diag code E11.9. Insulin dependent 125 each 2  . HYDROcodone-acetaminophen (NORCO/VICODIN) 5-325 MG per tablet Take 1 tablet by mouth every 8 (eight) hours as needed for moderate pain. 20 tablet 0  . insulin aspart (NOVOLOG) 100 UNIT/ML injection Inject 14 Units into the skin 3 (three) times daily with meals. 14 u before breakfast, 14 u before lunch, 14 u before dinner 10 mL 0  . Insulin Glargine (LANTUS) 100 UNIT/ML Solostar Pen Inject 42 Units into the skin at bedtime. 15 mL 11  . ketoconazole (NIZORAL) 2 % shampoo Apply topically 2 (two) times a week. Wash hair and  beard.  Leave shampoo on skin for 5 mins before washing off. 120 mL 1  . Lancets (FREESTYLE) lancets Check blood sugar 4 times a day before meals and bed. diag code E11.9. Insulin dependent 200 each 5  . lovastatin (MEVACOR) 20 MG tablet Take 1 tablet (20 mg total) by mouth daily. 30 tablet 11  . mycophenolate (MYFORTIC) 180 MG EC tablet Take 720 mg by mouth 2 (two) times daily.    Marland Kitchen omeprazole (PRILOSEC) 20 MG capsule Take 20 mg by mouth daily.    . predniSONE (DELTASONE) 5 MG tablet Take 5 mg by mouth daily with breakfast.    . promethazine (PHENERGAN) 25 MG tablet Take 25 mg by mouth every 8 (eight) hours as needed. For nausea/vomiting    . tacrolimus (PROGRAF) 1 MG capsule Take 8 mg by mouth 2 (two) times daily.    Marland Kitchen triamcinolone (KENALOG) 0.025 % ointment Apply topically 2 (two) times daily. Apply to nose and beard, in areas of dry skin 80 g 1  . valGANciclovir (VALCYTE) 450 MG tablet Take 450 mg by mouth every Monday, Wednesday, and Friday.     No current facility-administered medications for this visit.   Family History  Problem Relation Age of Onset  . Hypertension Mother   . Diabetes Father   . Kidney disease Father   . Other Father     amputation  . Coronary artery disease Neg Hx     no hx of CHF, sudden cardiac death, or CAD  . Hypertension Brother   . Other Brother     bleeding problems   History   Social History  . Marital Status: Married    Spouse Name: N/A  . Number of Children: N/A  . Years of Education: N/A   Social History Main Topics  . Smoking status: Never Smoker   . Smokeless tobacco: Never Used  . Alcohol Use: No  . Drug Use: No  . Sexual Activity: Not on file   Other Topics Concern  . None   Social History Narrative   Has two children. No risky sexual behavior.   Review of Systems: General: no recent illness, no change in activity, experienced diaphoresis during hypoglycemic episodes over the past week Skin: no rashes or lesions HEENT: no  headaches or blurred vision Cardiac: no chest pain or palpitations Respiratory: no shortness of breath GI: no changes, no pain Urinary: no changes Msk: no changes Psychiatric: no changes  Objective:  Physical Exam: Filed Vitals:   01/08/15 0936  BP: 134/64  Pulse: 80  Temp: 98.5 F (36.9 C)  TempSrc: Oral  Height: _0  (1.651 m)  Weight: 170 lb 3.2 oz (77.202 kg)  SpO2: 97%   Appearance: in NAD, allows wife to answer majority of questions about prior day's events, patient is less  apathetic today, drinking low-calorie Gatorade during visit HEENT: AT/[place], PERRL, EOMi, no diaphoresis Heart: RRR, normal S1S2, no murmurs Lungs: CTAB, no wheezes Abdomen: BS+, soft, nontender Musculoskeletal: normal ROM, no edema Neurologic: A&Ox3, normal sensation, grossly intact Skin: no rashes or lesions  Assessment & Plan:  Case discussed with Dr. Lynnae January  ESSENTIAL HYPERTENSION, BENIGN BP Readings from Last 3 Encounters:  01/08/15 134/64  01/01/15 143/71  11/11/14 144/76    Lab Results  Component Value Date   NA 142 09/12/2012   K 5.7* 12/22/2012   CREATININE 11.10* 07/11/2012    Assessment: Blood pressure control: good   Progress toward BP goal: at goal     Comments: good compliance with carvedilol  Plan: Medications:  continue current medications; carvedilol 10 mg daily    DM2 (diabetes mellitus, type 2) Patient has had volatile blood sugar levels on various insulin regimens (initially set by his renal transplant doctors) over the past month. He is here for a management check of his new insulin regimen that was initiated last week. For the past 5 days, he was using 14 U novolog before each meal and 42 U lantus at bedtime with a sliding scale (2 U for every 50 points over 150). In reviewing his log, the sliding scale ultimately resulted in a hypoglycemic episode on a regular basis. This was further complicated by rescue glucose gel being given during hypoglycemic episodes that  then caused hyperglycemia into the high 200s on the next read. It appears that cutting out the sliding scale might normalize his sugars.   - Continue insulin regimen (14 U novolog 15 minutes before each meal and 42 U lantus at night) - NO SLIDING SCALE - Continue to keep blood sugar log - Also keep log of diet this week - Return to clinic with logs in 1 week; if VERY well controlled over this time, can cancel appointment and return in 2 weeks - Meet with Butch Penny about carbohydrate counting within the next month      Medications Ordered Meds ordered this encounter  Medications  . Insulin Glargine (LANTUS) 100 UNIT/ML Solostar Pen    Sig: Inject 42 Units into the skin at bedtime.    Dispense:  15 mL    Refill:  11  . insulin aspart (NOVOLOG) 100 UNIT/ML injection    Sig: Inject 14 Units into the skin 3 (three) times daily with meals. 14 u before breakfast, 14 u before lunch, 14 u before dinner    Dispense:  10 mL    Refill:  0   Other Orders Orders Placed This Encounter  Procedures  . Glucose, capillary  . Consult to diabetes coordinator

## 2015-01-08 NOTE — Assessment & Plan Note (Addendum)
Patient has had volatile blood sugar levels on various insulin regimens (initially set by his renal transplant doctors) over the past month. He is here for a management check of his new insulin regimen that was initiated last week. For the past 5 days, he was using 14 U novolog before each meal and 42 U lantus at bedtime with a sliding scale (2 U for every 50 points over 150). In reviewing his log, the sliding scale ultimately resulted in a hypoglycemic episode on a regular basis. This was further complicated by rescue glucose gel being given during hypoglycemic episodes that then caused hyperglycemia into the high 200s on the next read. It appears that cutting out the sliding scale might normalize his sugars.   - Continue insulin regimen (14 U novolog 15 minutes before each meal and 42 U lantus at night) - NO SLIDING SCALE - Continue to keep blood sugar log - Also keep log of diet this week - Return to clinic with logs in 1 week; if VERY well controlled over this time, can cancel appointment and return in 2 weeks - Meet with Butch Penny about carbohydrate counting within the next month

## 2015-01-08 NOTE — Assessment & Plan Note (Signed)
BP Readings from Last 3 Encounters:  01/08/15 134/64  01/01/15 143/71  11/11/14 144/76    Lab Results  Component Value Date   NA 142 09/12/2012   K 5.7* 12/22/2012   CREATININE 11.10* 07/11/2012    Assessment: Blood pressure control: good   Progress toward BP goal: at goal     Comments: good compliance with carvedilol  Plan: Medications:  continue current medications; carvedilol 10 mg daily

## 2015-01-08 NOTE — Patient Instructions (Signed)
Please continue with: novolog 14 units 15 minutes before breakfast novolog 14 units 15 minutes before lunch novolog 14 units 15 minutes before dinner lantus 42 units at bedtime  Do this for 1 week  Continue to do a great job keeping the log of the blood sugars and insulin doses. Also please write a diet log.  If things are going really well for the next week, you can cancel your appointment and come back in 2 weeks.  Please also meet with Butch Penny, our diabetes coordinator, for a session on carb counting in the next month.  General Instructions:   Please bring your medicines with you each time you come to clinic.  Medicines may include prescription medications, over-the-counter medications, herbal remedies, eye drops, vitamins, or other pills.   Progress Toward Treatment Goals:  Treatment Goal 11/11/2014  Hemoglobin A1C unable to assess  Blood pressure -    Self Care Goals & Plans:  Self Care Goal 01/08/2015  Manage my medications bring my medications to every visit; refill my medications on time; take my medicines as prescribed  Monitor my health keep track of my blood glucose; bring my glucose meter and log to each visit; keep track of my blood pressure; check my feet daily; keep track of my weight  Eat healthy foods eat more vegetables; eat foods that are low in salt; eat baked foods instead of fried foods  Be physically active find an activity I enjoy; take a walk every day  Meeting treatment goals -    Home Blood Glucose Monitoring 08/23/2013  Check my blood sugar once a day  When to check my blood sugar before breakfast     Care Management & Community Referrals:  Referral 08/23/2013  Referrals made for care management support diabetes educator

## 2015-01-10 NOTE — Progress Notes (Signed)
Internal Medicine Clinic Attending  Case discussed with Dr. Mallory at the time of the visit.  We reviewed the resident's history and exam and pertinent patient test results.  I agree with the assessment, diagnosis, and plan of care documented in the resident's note. 

## 2015-01-15 ENCOUNTER — Telehealth: Payer: Self-pay | Admitting: Internal Medicine

## 2015-01-15 NOTE — Telephone Encounter (Signed)
Call to patient to confirm appointment for 01/16/15 at 10:15 lmtcb

## 2015-01-16 ENCOUNTER — Encounter: Payer: Self-pay | Admitting: Internal Medicine

## 2015-01-16 ENCOUNTER — Ambulatory Visit (INDEPENDENT_AMBULATORY_CARE_PROVIDER_SITE_OTHER): Payer: Medicare Other | Admitting: Internal Medicine

## 2015-01-16 VITALS — BP 148/73 | HR 85 | Temp 97.8°F | Ht 65.0 in | Wt 168.8 lb

## 2015-01-16 DIAGNOSIS — E1165 Type 2 diabetes mellitus with hyperglycemia: Secondary | ICD-10-CM

## 2015-01-16 DIAGNOSIS — I10 Essential (primary) hypertension: Secondary | ICD-10-CM

## 2015-01-16 DIAGNOSIS — Z794 Long term (current) use of insulin: Secondary | ICD-10-CM

## 2015-01-16 DIAGNOSIS — IMO0002 Reserved for concepts with insufficient information to code with codable children: Secondary | ICD-10-CM

## 2015-01-16 DIAGNOSIS — E11649 Type 2 diabetes mellitus with hypoglycemia without coma: Secondary | ICD-10-CM

## 2015-01-16 DIAGNOSIS — E118 Type 2 diabetes mellitus with unspecified complications: Secondary | ICD-10-CM

## 2015-01-16 LAB — POCT GLYCOSYLATED HEMOGLOBIN (HGB A1C): Hemoglobin A1C: 7.7

## 2015-01-16 LAB — GLUCOSE, CAPILLARY: Glucose-Capillary: 72 mg/dL (ref 65–99)

## 2015-01-16 MED ORDER — INSULIN ASPART 100 UNIT/ML ~~LOC~~ SOLN: 12.0000 [IU] | Freq: Three times a day (TID) | SUBCUTANEOUS | Status: DC

## 2015-01-16 MED ORDER — INSULIN GLARGINE 100 UNIT/ML SOLOSTAR PEN: 36.0000 [IU] | PEN_INJECTOR | Freq: Every day | SUBCUTANEOUS | Status: DC

## 2015-01-16 NOTE — Addendum Note (Signed)
Addended by: Drucilla Schmidt E on: 01/16/2015 02:28 PM   Modules accepted: Orders

## 2015-01-16 NOTE — Assessment & Plan Note (Signed)
  Lab Results  Component Value Date   HGBA1C 7.7 01/16/2015   HGBA1C 9.5 11/01/2014   HGBA1C 7.5 07/04/2014     Assessment: Diabetes control: good improvement in A1c, but blood glucose continues to vacillate much too widely    Progress toward A1C goal: improving     Comments: Better controlled today on regimen without sliding scale (14 U novolog before meals and 42 U lantus at bedtime). However, patient has had some episodes of symptomatic hypoglycemia (including one to 40).   Plan: - Reduce dosing to 12 U novolog before each meal and 36 U lantus at bedtime - Keep log of blood glucose and meals this week and bring to next visit! - Return to clinic in 1 week to review the week's results - Appointment with Butch Penny for diet education in next month (once good regimen is reached).

## 2015-01-16 NOTE — Progress Notes (Signed)
Medicine attending: Medical history, presenting problems, physical findings, and medications, reviewed with Dr Drucilla Schmidt and I concur with her evaluation and management plan.

## 2015-01-16 NOTE — Assessment & Plan Note (Signed)
BP Readings from Last 3 Encounters:  01/16/15 148/73  01/08/15 134/64  01/01/15 143/71    Lab Results  Component Value Date   NA 142 09/12/2012   K 5.7* 12/22/2012   CREATININE 11.10* 07/11/2012    Assessment: Blood pressure control: slightly elevated   Progress toward BP goal: near goal    Plan: Medications:  continue current medications; carvedilol 10 mg daily Educational resources provided: brochure (denies)

## 2015-01-16 NOTE — Patient Instructions (Addendum)
Novolog 12 U before breakfast Novolog 12 U before lunch Novolog 12 U before dinner Lantus 36 U before bed  Please bring your blood sugar log, diet log and meter to your next visit.  Return in one week.  I would still like you to meet with Butch Penny in the next month for education and discussion of diet.  General Instructions:   Please bring your medicines with you each time you come to clinic.  Medicines may include prescription medications, over-the-counter medications, herbal remedies, eye drops, vitamins, or other pills.   Progress Toward Treatment Goals:  Treatment Goal 11/11/2014  Hemoglobin A1C unable to assess  Blood pressure -    Self Care Goals & Plans:  Self Care Goal 01/16/2015  Manage my medications take my medicines as prescribed; bring my medications to every visit; refill my medications on time  Monitor my health -  Eat healthy foods drink diet soda or water instead of juice or soda; eat more vegetables; eat foods that are low in salt; eat baked foods instead of fried foods; eat fruit for snacks and desserts  Be physically active -  Meeting treatment goals -    Home Blood Glucose Monitoring 08/23/2013  Check my blood sugar once a day  When to check my blood sugar before breakfast     Care Management & Community Referrals:  Referral 08/23/2013  Referrals made for care management support diabetes educator

## 2015-01-16 NOTE — Progress Notes (Signed)
[facility] Subjective:   Patient ID: [name] male   DOB: Feb 01, 1971 44 y.o.   MRN: [id number]  HPI: [name] is a 44 y.o. male with a history of DM2, ESRD s/p renal transplant, dilated cardiomyopathy and hypertension who presents for one-week follow up of his diabetes regimen. He was seen here two weeks at the suggestion of his transplant clinic when his CBG resulted at 625 at that clinic. He was asymptomatic at the time, and his wife helped him manage his insulin that night. Unfortunately, he ended up receiving a very large amount of novolog along with his nightly lantus, and ended up stacking and developing symptomatic hypoglycemia with a finger stick of 30 in the middle of the night. The next day, during his clinic visit, his regimen was simplified to novolog 14 U 15 minutes before each meal and lantus 42 U before bedtime. His wife was uncomfortable with this plan without a sliding scale (as his levels were so volatile the week prior), so a sliding scale was added (2U for every 50 points over 150). The sliding scale that week led to some episodes of hypoglycemia, so last week, the sliding scale was dropped. He was to manage on 14 U novolog 15 minutes before each meal and 42 U lantus at night. He was to keep a diet and blood sugar log and to meet with Butch Penny at some point in the next month.  Today, he forgot his log and meter, but is able to summarize his blood glucose levels from the past week. He states that the vast majority of them were between 113-126. However, he did have multiple episodes of symptomatic hypoglycemia, which occurred at various times throughout the day (morning, afternoon and middle of the night). The worst of these happened when he was sitting at church at 3:00PM after his normal 14 U lunch-time novolog and a normal chicken sandwich lunch. Each time he has become hypoglycemic, he eats candies and drinks juice. He believes his control is better,  and he is eager to reach a regimen that keeps him closer to his goal ranges.    Past Medical History  Diagnosis Date  . Non-ischemic cardiomyopathy     EF 20-25% 12/23/2008>>EF 40-45% 03/2009>>EF ??? 7/?/2011  . ESRD (end stage renal disease) on dialysis     1.Initiated HD via right per cate 12/30/2008 (MWF schedule at [facility]) 2.Left upper extremity A-V fistula by Dr Oneida Alar 12/30/2008 3.Aemia: Received Venofer 12/30/08 Ferritin 179 % sat 12 12/23/08, 4. Secondary Hyperparathyroidism- PTH 188, P 5.4, Ca 9.2 12/2008  . Diabetes mellitus 12/2008    A1C 8.3 12/23/08, 24hr urine protein 2793 mg/day, SPEP neg, proliferative BDR s/p photocoagulation 08/2009 done at [facility] and f/u by DR Groat  . Hypertension     resloved after initating HD, now with post HD hypotension  . Hemodialysis-associated hypotension     coreg d/c'd  . Anemia   . CHF (congestive heart failure)   . Complication of anesthesia   . PONV (postoperative nausea and vomiting)   . History of blood transfusion   . Psoriasis     scalp   Current Outpatient Prescriptions  Medication Sig Dispense Refill  . aspirin EC 81 MG tablet Take 81 mg by mouth daily.    . B Complex-C-Folic Acid (RENA-VITE PO) Take 1 tablet by mouth daily.    . Blood Glucose Monitoring Suppl (ACCU-CHEK NANO SMARTVIEW) W/DEVICE KIT Use as instructed 1 kit 0  .  calcium carbonate (OS-CAL - DOSED IN MG OF ELEMENTAL CALCIUM) 1250 MG tablet Take 1 tablet by mouth 3 (three) times daily with meals.    . carvedilol (COREG CR) 10 MG 24 hr capsule Take 10 mg by mouth daily.    . dapsone 25 MG tablet Take 50 mg by mouth daily.    Marland Kitchen docusate sodium (DULCOLAX) 100 MG capsule Take 100 mg by mouth daily as needed. For constipation    . glucose blood (FREESTYLE LITE) test strip Check blood sugar 4 times a day before meals and bed. diag code E11.9. Insulin dependent 125 each 2  . HYDROcodone-acetaminophen (NORCO/VICODIN) 5-325 MG per tablet Take 1 tablet by mouth every 8  (eight) hours as needed for moderate pain. 20 tablet 0  . insulin aspart (NOVOLOG) 100 UNIT/ML injection Inject 12 Units into the skin 3 (three) times daily with meals. 14 u before breakfast, 14 u before lunch, 14 u before dinner 10 mL 0  . Insulin Glargine (LANTUS) 100 UNIT/ML Solostar Pen Inject 36 Units into the skin at bedtime. 15 mL 11  . ketoconazole (NIZORAL) 2 % shampoo Apply topically 2 (two) times a week. Wash hair and beard.  Leave shampoo on skin for 5 mins before washing off. 120 mL 1  . Lancets (FREESTYLE) lancets Check blood sugar 4 times a day before meals and bed. diag code E11.9. Insulin dependent 200 each 5  . lovastatin (MEVACOR) 20 MG tablet Take 1 tablet (20 mg total) by mouth daily. 30 tablet 11  . mycophenolate (MYFORTIC) 180 MG EC tablet Take 720 mg by mouth 2 (two) times daily.    Marland Kitchen omeprazole (PRILOSEC) 20 MG capsule Take 20 mg by mouth daily.    . predniSONE (DELTASONE) 5 MG tablet Take 5 mg by mouth daily with breakfast.    . promethazine (PHENERGAN) 25 MG tablet Take 25 mg by mouth every 8 (eight) hours as needed. For nausea/vomiting    . tacrolimus (PROGRAF) 1 MG capsule Take 8 mg by mouth 2 (two) times daily.    Marland Kitchen triamcinolone (KENALOG) 0.025 % ointment Apply topically 2 (two) times daily. Apply to nose and beard, in areas of dry skin 80 g 1  . valGANciclovir (VALCYTE) 450 MG tablet Take 450 mg by mouth every Monday, Wednesday, and Friday.     No current facility-administered medications for this visit.   Family History  Problem Relation Age of Onset  . Hypertension Mother   . Diabetes Father   . Kidney disease Father   . Other Father     amputation  . Coronary artery disease Neg Hx     no hx of CHF, sudden cardiac death, or CAD  . Hypertension Brother   . Other Brother     bleeding problems   History   Social History  . Marital Status: Married    Spouse Name: N/A  . Number of Children: N/A  . Years of Education: N/A   Social History Main Topics   . Smoking status: Never Smoker   . Smokeless tobacco: Never Used  . Alcohol Use: No  . Drug Use: No  . Sexual Activity: Not on file   Other Topics Concern  . None   Social History Narrative   Has two children. No risky sexual behavior.   Review of Systems: General: no recent illness Skin: no rashes or lesions HEENT: no headaches, sweating or blurred vision Cardiac: no chest pain or palpitations Respiratory: no shortness of breath GI: no abdominal  pain or changes in BMs Urinary: no changes Msk: no joint pain or swelling Psychiatric: no changes  Objective:  Physical Exam: Filed Vitals:   01/16/15 1040  BP: 148/73  Pulse: 85  Temp: 97.8 F (36.6 C)  TempSrc: Oral  Height: _0  (1.651 m)  Weight: 168 lb 12.8 oz (76.567 kg)  SpO2: 99%   Appearance: in NAD, in examining room with wife HEENT: AT/[place], PERRL, EOMi, no diaphoresis Heart: RRR, normal S1S2 Lungs: CTAB, no wheezes Abdomen: BS+, soft, nontender Musculoskeletal: normal range of motion Extremities: no edema Neurologic: A&Ox3, grossly intact Skin: no rashes or lesions  Assessment & Plan:  Case discussed with Dr. Beryle Beams  ESSENTIAL HYPERTENSION, BENIGN BP Readings from Last 3 Encounters:  01/16/15 148/73  01/08/15 134/64  01/01/15 143/71    Lab Results  Component Value Date   NA 142 09/12/2012   K 5.7* 12/22/2012   CREATININE 11.10* 07/11/2012    Assessment: Blood pressure control: slightly elevated   Progress toward BP goal: near goal    Plan: Medications:  continue current medications; carvedilol 10 mg daily Educational resources provided: brochure (denies)    DM2 (diabetes mellitus, type 2)  Lab Results  Component Value Date   HGBA1C 7.7 01/16/2015   HGBA1C 9.5 11/01/2014   HGBA1C 7.5 07/04/2014     Assessment: Diabetes control: good improvement in A1c, but blood glucose continues to vacillate much too widely    Progress toward A1C goal: improving     Comments: Better  controlled today on regimen without sliding scale (14 U novolog before meals and 42 U lantus at bedtime). However, patient has had some episodes of symptomatic hypoglycemia (including one to 40).   Plan: - Reduce dosing to 12 U novolog before each meal and 36 U lantus at bedtime - Keep log of blood glucose and meals this week and bring to next visit! - Return to clinic in 1 week to review the week's results - Appointment with Butch Penny for diet education in next month (once good regimen is reached).        Medications Ordered Meds ordered this encounter  Medications  . Insulin Glargine (LANTUS) 100 UNIT/ML Solostar Pen    Sig: Inject 36 Units into the skin at bedtime.    Dispense:  15 mL    Refill:  11  . insulin aspart (NOVOLOG) 100 UNIT/ML injection    Sig: Inject 12 Units into the skin 3 (three) times daily with meals. 14 u before breakfast, 14 u before lunch, 14 u before dinner    Dispense:  10 mL    Refill:  0   Other Orders Orders Placed This Encounter  Procedures  . Glucose, capillary  . POC Hbg A1C

## 2015-01-17 DIAGNOSIS — Z940 Kidney transplant status: Secondary | ICD-10-CM | POA: Diagnosis not present

## 2015-01-22 ENCOUNTER — Telehealth: Payer: Self-pay | Admitting: Internal Medicine

## 2015-01-22 NOTE — Telephone Encounter (Signed)
Call to patient to confirm appointment for 01/23/15 at 10:15 lmtcb

## 2015-01-23 ENCOUNTER — Ambulatory Visit: Payer: Medicare Other | Admitting: Internal Medicine

## 2015-01-28 ENCOUNTER — Other Ambulatory Visit: Payer: Self-pay | Admitting: *Deleted

## 2015-01-29 ENCOUNTER — Ambulatory Visit (INDEPENDENT_AMBULATORY_CARE_PROVIDER_SITE_OTHER): Payer: Medicare Other | Admitting: Internal Medicine

## 2015-01-29 ENCOUNTER — Encounter: Payer: Self-pay | Admitting: Internal Medicine

## 2015-01-29 VITALS — BP 126/59 | HR 83 | Temp 98.0°F | Ht 65.0 in | Wt 166.9 lb

## 2015-01-29 DIAGNOSIS — Z794 Long term (current) use of insulin: Secondary | ICD-10-CM | POA: Diagnosis not present

## 2015-01-29 DIAGNOSIS — E119 Type 2 diabetes mellitus without complications: Secondary | ICD-10-CM

## 2015-01-29 DIAGNOSIS — I10 Essential (primary) hypertension: Secondary | ICD-10-CM

## 2015-01-29 DIAGNOSIS — Z940 Kidney transplant status: Secondary | ICD-10-CM

## 2015-01-29 DIAGNOSIS — E118 Type 2 diabetes mellitus with unspecified complications: Secondary | ICD-10-CM

## 2015-01-29 DIAGNOSIS — E1165 Type 2 diabetes mellitus with hyperglycemia: Secondary | ICD-10-CM

## 2015-01-29 DIAGNOSIS — Z79899 Other long term (current) drug therapy: Secondary | ICD-10-CM

## 2015-01-29 DIAGNOSIS — IMO0002 Reserved for concepts with insufficient information to code with codable children: Secondary | ICD-10-CM

## 2015-01-29 MED ORDER — INSULIN GLARGINE 100 UNIT/ML SOLOSTAR PEN: 32.0000 [IU] | PEN_INJECTOR | Freq: Every day | SUBCUTANEOUS | Status: DC

## 2015-01-29 MED ORDER — PEN NEEDLES 31G X 8 MM MISC: Status: DC

## 2015-01-29 MED ORDER — INSULIN ASPART 100 UNIT/ML FLEXPEN: PEN_INJECTOR | SUBCUTANEOUS | Status: DC

## 2015-01-29 NOTE — Telephone Encounter (Signed)
Would you pls verify he uses a pen?

## 2015-01-29 NOTE — Assessment & Plan Note (Signed)
Lab Results  Component Value Date   HGBA1C 7.7 01/16/2015   HGBA1C 9.5 11/01/2014   HGBA1C 7.5 07/04/2014     Assessment: Diabetes control: fair control Progress toward A1C goal:  improved Comments: [name] has been having his insulin regimen changed at past few visits to optimize glycemic control. His sugars can be labile due to immunosuppressive drugs for his kidney transplant. At last visit on 6/2, his lantus 42 u qhs and novolog 14 u tidac were decreased to lantus 36 u qhs and novolog 12 u tidac. He does not have his meter today but per patient and wife he has been generally in 110s-120s. He had one low in the morning of 75 and was asymptomatic. His high was 229.  Plan: Medications:  Decrease lantus to 32 u qhs and keep novolog at 12 u tidac.  Home glucose monitoring: Frequency:   Timing:   Instruction/counseling given: reminded to bring blood glucose meter & log to each visit and reminded to bring medications to each visit Educational resources provided:  (REFUSED) Self management tools provided:   Other plans: Ask Butch Penny to call patient in about one month to check how his sugars are doing, otherwise RTC 3 months

## 2015-01-29 NOTE — Patient Instructions (Signed)
It was a pleasure to see you today. We have kept your novolog at 12 units for meals and decreased your lantus to 32 units at night. We will ask Butch Penny to call you in a month to check in on your sugars. Please return to clinic or seek medical attention if you have any new or worsening high or low sugars, passing out, or other worrisome medical condition. We look forward to seeing you again in 3 months.  Lottie Mussel, MD  General Instructions:   Thank you for bringing your medicines today. This helps [place] keep you safe from mistakes.   Progress Toward Treatment Goals:  Treatment Goal 11/11/2014  Hemoglobin A1C unable to assess  Blood pressure -    Self Care Goals & Plans:  Self Care Goal 01/29/2015  Manage my medications take my medicines as prescribed; refill my medications on time  Monitor my health keep track of my blood glucose; bring my glucose meter and log to each visit; check my feet daily  Eat healthy foods eat more vegetables; eat foods that are low in salt; eat baked foods instead of fried foods  Be physically active take a walk every day  Meeting treatment goals -    Home Blood Glucose Monitoring 08/23/2013  Check my blood sugar once a day  When to check my blood sugar before breakfast     Care Management & Community Referrals:  Referral 08/23/2013  Referrals made for care management support diabetes educator

## 2015-01-29 NOTE — Telephone Encounter (Signed)
He does, uses pens for novolog and lantus

## 2015-01-29 NOTE — Progress Notes (Signed)
   Subjective:    Patient ID: [name], male    DOB: 1971/06/03, 44 y.o.   MRN: [id number]  HPI  [name] is a 44 year old man with ESRD s/p renal transplant, dilated cardiomyopathy, DM2, HTN here for follow-up on his chronic co-morbidities.   Review of Systems  Constitutional: Negative for fever, chills and diaphoresis.  Respiratory: Negative for cough and shortness of breath.   Cardiovascular: Negative for chest pain.  Gastrointestinal: Negative for nausea, vomiting, abdominal pain, diarrhea and constipation.  Neurological: Negative for weakness, light-headedness, numbness and headaches.       Objective:   Physical Exam  Constitutional: He is oriented to person, place, and time. He appears well-developed and well-nourished. No distress.  HENT:  Head: Normocephalic and atraumatic.  Mouth/Throat: Oropharynx is clear and moist.  Eyes: EOM are normal. Pupils are equal, round, and reactive to light.  Cardiovascular: Normal rate, regular rhythm, normal heart sounds and intact distal pulses.  Exam reveals no gallop and no friction rub.   No murmur heard. Pulmonary/Chest: Effort normal and breath sounds normal. No respiratory distress. He has no wheezes.  Abdominal: Soft. Bowel sounds are normal. He exhibits no distension. There is no tenderness.  Musculoskeletal: He exhibits no edema.  Neurological: He is alert and oriented to person, place, and time.  Skin: He is not diaphoretic.  Vitals reviewed.         Assessment & Plan:

## 2015-01-29 NOTE — Assessment & Plan Note (Signed)
BP Readings from Last 3 Encounters:  01/29/15 126/59  01/16/15 148/73  01/08/15 134/64    Lab Results  Component Value Date   NA 142 09/12/2012   K 5.7* 12/22/2012   CREATININE 11.10* 07/11/2012    Assessment: Blood pressure control: controlled Progress toward BP goal:  at goal Comments: Well controlled on coreg cr 10 mg daily  Plan: Medications:  continue current medications Educational resources provided:  (REFUSED) Self management tools provided:   Other plans: RTC 3 months

## 2015-01-31 NOTE — Progress Notes (Signed)
Internal Medicine Clinic Attending  Case discussed with Dr. Rothman at the time of the visit.  We reviewed the resident's history and exam and pertinent patient test results.  I agree with the assessment, diagnosis, and plan of care documented in the resident's note. 

## 2015-02-04 ENCOUNTER — Emergency Department (INDEPENDENT_AMBULATORY_CARE_PROVIDER_SITE_OTHER)
Admission: EM | Admit: 2015-02-04 | Discharge: 2015-02-04 | Disposition: A | Discharge status: Home / Self Care | Payer: Medicare Other | Source: Home / Self Care | Attending: Family Medicine | Admitting: Family Medicine

## 2015-02-04 ENCOUNTER — Encounter: Payer: Medicare Other | Admitting: Internal Medicine

## 2015-02-04 ENCOUNTER — Encounter (HOSPITAL_COMMUNITY): Payer: Self-pay | Admitting: Emergency Medicine

## 2015-02-04 DIAGNOSIS — Z940 Kidney transplant status: Secondary | ICD-10-CM

## 2015-02-04 DIAGNOSIS — N183 Chronic kidney disease, stage 3 unspecified: Secondary | ICD-10-CM

## 2015-02-04 DIAGNOSIS — B029 Zoster without complications: Secondary | ICD-10-CM | POA: Diagnosis not present

## 2015-02-04 LAB — POCT I-STAT, CHEM 8
BUN: 27 mg/dL — ABNORMAL HIGH (ref 6–20)
CALCIUM ION: 1.34 mmol/L — AB (ref 1.12–1.23)
Chloride: 104 mmol/L (ref 101–111)
Creatinine, Ser: 2.1 mg/dL — ABNORMAL HIGH (ref 0.61–1.24)
GLUCOSE: 230 mg/dL — AB (ref 65–99)
HEMATOCRIT: 37 % — AB (ref 39.0–52.0)
Hemoglobin: 12.6 g/dL — ABNORMAL LOW (ref 13.0–17.0)
Potassium: 5.1 mmol/L (ref 3.5–5.1)
Sodium: 140 mmol/L (ref 135–145)
TCO2: 25 mmol/L (ref 0–100)

## 2015-02-04 MED ORDER — TRAMADOL HCL 50 MG PO TABS: 50.0000 mg | ORAL_TABLET | Freq: Four times a day (QID) | ORAL | Status: DC | PRN

## 2015-02-04 MED ORDER — VALACYCLOVIR HCL 1 G PO TABS: 1000.0000 mg | ORAL_TABLET | Freq: Two times a day (BID) | ORAL | Status: DC

## 2015-02-04 NOTE — ED Provider Notes (Signed)
CSN: [id number]     Arrival date & time 02/04/15  1314 History   First MD Initiated Contact with Patient 02/04/15 1433     Chief Complaint  Patient presents with  . Rash   (Consider location/radiation/quality/duration/timing/severity/associated sxs/prior Treatment) HPI  Rash. Started 1 day ago. Left lumbar spine with progression around his left flank and into the left groin area. Smaller pustules present throughout. Serous fluid inside. Very tender and a burning sensation described. Symptoms are constant and getting worse. Denies any change in soaps lotions or detergents were generalized itch. No history of contact dermatitis. Denies fevers, nausea, vomiting, chest pain, abdominal pain.  Renal 0.1 month ago and was told his kidney function is doing well.  Past Medical History  Diagnosis Date  . Non-ischemic cardiomyopathy     EF 20-25% 12/23/2008>>EF 40-45% 03/2009>>EF ??? 7/?/2011  . ESRD (end stage renal disease) on dialysis     1.Initiated HD via right per cate 12/30/2008 (MWF schedule at [facility]) 2.Left upper extremity A-V fistula by Dr Oneida Alar 12/30/2008 3.Aemia: Received Venofer 12/30/08 Ferritin 179 % sat 12 12/23/08, 4. Secondary Hyperparathyroidism- PTH 188, P 5.4, Ca 9.2 12/2008  . 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 Hypertension     resloved after initating HD, now with post HD hypotension  . Hemodialysis-associated hypotension     coreg d/c'd  . Anemia   . CHF (congestive heart failure)   . Complication of anesthesia   . PONV (postoperative nausea and vomiting)   . History of blood transfusion   . Psoriasis     scalp   Past Surgical History  Procedure Laterality Date  . Cataract extraction, bilateral    . Av fistula placement      left  . Photocoagulation      for diabetic retinopathy 08/1999  . Insertion of dialysis catheter   07/14/2012    Procedure: INSERTION OF DIALYSIS CATHETER;  Surgeon: Mal Misty, MD;  Location: [place];  Service: Vascular;  Laterality: Right;  Ultrasound Guided Insertion of Internal Jugular Dialysis Catheter  . Eye surgery    . Kidney transplant  2013    did not work  . Nephrectomy transplanted organ  08/11/12  . Av fistula placement  09/12/2012    Procedure: INSERTION OF ARTERIOVENOUS (AV) GORE-TEX GRAFT ARM;  Surgeon: Elam Dutch, MD;  Location: [place];  Service: Vascular;  Laterality: Left;  KIDNEY TRANSPLANT FAILED  . Shuntogram N/A 07/11/2012    Procedure: Earney Mallet;  Surgeon: Serafina Mitchell, MD;  Location: [facility] CATH LAB;  Service: Cardiovascular;  Laterality: N/A;   Family History  Problem Relation Age of Onset  . Hypertension Mother   . Diabetes Father   . Kidney disease Father   . Other Father     amputation  . Coronary artery disease Neg Hx     no hx of CHF, sudden cardiac death, or CAD  . Hypertension Brother   . Other Brother     bleeding problems   History  Substance Use Topics  . Smoking status: Never Smoker   . Smokeless tobacco: Never Used  . Alcohol Use: No    Review of Systems   Per HPI with all other pertinent systems negative.   Allergies  Tape  Home Medications   Prior to Admission medications   Medication Sig Start Date End  Date Taking? Authorizing Provider  aspirin EC 81 MG tablet Take 81 mg by mouth daily.    Historical Provider, MD  B Complex-C-Folic Acid (RENA-VITE PO) Take 1 tablet by mouth daily.    Historical Provider, MD  Blood Glucose Monitoring Suppl (ACCU-CHEK NANO SMARTVIEW) W/DEVICE KIT Use as instructed 04/24/14   Kelby Aline, MD  calcium carbonate (OS-CAL - DOSED IN MG OF ELEMENTAL CALCIUM) 1250 MG tablet Take 1 tablet by mouth 3 (three) times daily with meals.    Historical Provider, MD  carvedilol (COREG CR) 10 MG 24 hr capsule Take 10 mg by mouth daily.    Historical Provider, MD  dapsone 25 MG tablet Take 50 mg by mouth  daily.    Historical Provider, MD  docusate sodium (DULCOLAX) 100 MG capsule Take 100 mg by mouth daily as needed. For constipation    Historical Provider, MD  glucose blood (FREESTYLE LITE) test strip Check blood sugar 4 times a day before meals and bed. diag code E11.9. Insulin dependent 12/25/14   Annia Belt, MD  HYDROcodone-acetaminophen (NORCO/VICODIN) 5-325 MG per tablet Take 1 tablet by mouth every 8 (eight) hours as needed for moderate pain. 10/16/13   Lucious Groves, DO  insulin aspart (NOVOLOG) 100 UNIT/ML FlexPen Inject 12 units with breakfast, 12 units with lunch, 12 units with supper 01/29/15   Kelby Aline, MD  Insulin Glargine (LANTUS) 100 UNIT/ML Solostar Pen Inject 32 Units into the skin at bedtime. 01/29/15   Kelby Aline, MD  Insulin Pen Needle (PEN NEEDLES) 31G X 8 MM MISC Use 4 times daily to inject insulin into the skin. diag codeE11.9. Insulin dependent 01/29/15   Bartholomew Crews, MD  ketoconazole (NIZORAL) 2 % shampoo Apply topically 2 (two) times a week. Wash hair and beard.  Leave shampoo on skin for 5 mins before washing off. 04/24/14   Kelby Aline, MD  Lancets (FREESTYLE) lancets Check blood sugar 4 times a day before meals and bed. diag code E11.9. Insulin dependent 12/31/14   Sid Falcon, MD  lovastatin (MEVACOR) 20 MG tablet Take 1 tablet (20 mg total) by mouth daily. 10/23/13 10/23/14  Olga Millers, MD  mycophenolate (MYFORTIC) 180 MG EC tablet Take 720 mg by mouth 2 (two) times daily.    Historical Provider, MD  omeprazole (PRILOSEC) 20 MG capsule Take 20 mg by mouth daily.    Historical Provider, MD  predniSONE (DELTASONE) 5 MG tablet Take 5 mg by mouth daily with breakfast.    Historical Provider, MD  promethazine (PHENERGAN) 25 MG tablet Take 25 mg by mouth every 8 (eight) hours as needed. For nausea/vomiting    Historical Provider, MD  tacrolimus (PROGRAF) 1 MG capsule Take 8 mg by mouth 2 (two) times daily.    Historical Provider, MD  traMADol  (ULTRAM) 50 MG tablet Take 1 tablet (50 mg total) by mouth every 6 (six) hours as needed. 02/04/15   Waldemar Dickens, MD  triamcinolone (KENALOG) 0.025 % ointment Apply topically 2 (two) times daily. Apply to nose and beard, in areas of dry skin 04/24/14   Kelby Aline, MD  valACYclovir (VALTREX) 1000 MG tablet Take 1 tablet (1,000 mg total) by mouth 2 (two) times daily. 02/04/15   Waldemar Dickens, MD  valGANciclovir (VALCYTE) 450 MG tablet Take 450 mg by mouth every Monday, Wednesday, and Friday.    Historical Provider, MD   BP 118/78 mmHg  Pulse 79  Temp(Src) 98.1 F (36.7  C) (Oral)  Resp 16  SpO2 95% Physical Exam Physical Exam  Constitutional: oriented to person, place, and time. appears well-developed and well-nourished. No distress.  HENT:  Head: Normocephalic and atraumatic.  Eyes: EOMI. PERRL.  Neck: Normal range of motion.  Cardiovascular: RRR, no m/r/g, 2+ distal pulses,  Pulmonary/Chest: Effort normal and breath sounds normal. No respiratory distress.  Abdominal: Soft. Bowel sounds are normal. NonTTP, no distension.  Musculoskeletal: Normal range of motion. Non ttp, no effusion.  Neurological: alert and oriented to person, place, and time.  Skin: Vesicular rash in a typical dermatomal distribution along the left lumbar spine to left inguinal region without ever crossing midline. No significant induration or crusting. Large abdominal midline scar.Marland Kitchen  Psychiatric: normal mood and affect. behavior is normal. Judgment and thought content normal.   ED Course  Procedures (including critical care time) Labs Review Labs Reviewed  POCT I-STAT, CHEM 8 - Abnormal; Notable for the following:    BUN 27 (*)    Creatinine, Ser 2.10 (*)    Glucose, Bld 230 (*)    Calcium, Ion 1.34 (*)    Hemoglobin 12.6 (*)    HCT 37.0 (*)    All other components within normal limits    Imaging Review No results found.   MDM   1. CKD (chronic kidney disease) stage 3, GFR 30-59 ml/min   2.  Shingles   3. Transplanted kidney    Given history of chronic kidney disease and renal transplant obtained a Chem-8 which shows a creatinine of 2.1. This correlates with a GFR 42. Patient is safe to take Valtrex 1 g twice a day. This was confirmed with pharmacy. Patient use Ancil Linsey for additional pain relief. NSAIDs non-option given renal history.    Waldemar Dickens, MD 02/04/15 1540

## 2015-02-04 NOTE — ED Notes (Signed)
C/o rash since last night... Started out on the back and spread to bilateral sides, abd, groin and legs Denies fevers, chills Alert, no signs of acute distress.

## 2015-02-04 NOTE — Discharge Instructions (Signed)
You have shingles. Please start the Valtrex. Please use the tramadol for severe pain relief. Please follow-up with your primary care physician. Your kidney function seems to be stable.

## 2015-02-19 DIAGNOSIS — E1129 Type 2 diabetes mellitus with other diabetic kidney complication: Secondary | ICD-10-CM | POA: Diagnosis not present

## 2015-02-19 DIAGNOSIS — I129 Hypertensive chronic kidney disease with stage 1 through stage 4 chronic kidney disease, or unspecified chronic kidney disease: Secondary | ICD-10-CM | POA: Diagnosis not present

## 2015-02-19 DIAGNOSIS — Z940 Kidney transplant status: Secondary | ICD-10-CM | POA: Diagnosis not present

## 2015-02-25 ENCOUNTER — Telehealth: Payer: Self-pay | Admitting: Dietician

## 2015-02-25 NOTE — Telephone Encounter (Signed)
Dr. Ethelene Hal asked CDE to call patient to follow up on his blood sugars.

## 2015-02-28 DIAGNOSIS — Z940 Kidney transplant status: Secondary | ICD-10-CM | POA: Diagnosis not present

## 2015-02-28 NOTE — Telephone Encounter (Signed)
Mailed letter asking them to call CDE or call front office to make an appointment with CDE to review meter and blood sugars.

## 2015-03-10 DIAGNOSIS — N183 Chronic kidney disease, stage 3 (moderate): Secondary | ICD-10-CM | POA: Diagnosis not present

## 2015-04-29 DIAGNOSIS — E785 Hyperlipidemia, unspecified: Secondary | ICD-10-CM | POA: Diagnosis not present

## 2015-04-29 DIAGNOSIS — D849 Immunodeficiency, unspecified: Secondary | ICD-10-CM | POA: Diagnosis not present

## 2015-04-29 DIAGNOSIS — E1129 Type 2 diabetes mellitus with other diabetic kidney complication: Secondary | ICD-10-CM | POA: Diagnosis not present

## 2015-04-29 DIAGNOSIS — N186 End stage renal disease: Secondary | ICD-10-CM | POA: Diagnosis not present

## 2015-04-29 DIAGNOSIS — Z6826 Body mass index (BMI) 26.0-26.9, adult: Secondary | ICD-10-CM | POA: Diagnosis not present

## 2015-04-29 DIAGNOSIS — I5022 Chronic systolic (congestive) heart failure: Secondary | ICD-10-CM | POA: Diagnosis not present

## 2015-04-29 DIAGNOSIS — Z940 Kidney transplant status: Secondary | ICD-10-CM | POA: Diagnosis not present

## 2015-04-29 DIAGNOSIS — E1139 Type 2 diabetes mellitus with other diabetic ophthalmic complication: Secondary | ICD-10-CM | POA: Diagnosis not present

## 2015-04-29 DIAGNOSIS — I428 Other cardiomyopathies: Secondary | ICD-10-CM | POA: Diagnosis not present

## 2015-04-29 DIAGNOSIS — E1140 Type 2 diabetes mellitus with diabetic neuropathy, unspecified: Secondary | ICD-10-CM | POA: Diagnosis not present

## 2015-04-29 DIAGNOSIS — Z1389 Encounter for screening for other disorder: Secondary | ICD-10-CM | POA: Diagnosis not present

## 2015-04-29 DIAGNOSIS — I10 Essential (primary) hypertension: Secondary | ICD-10-CM | POA: Diagnosis not present

## 2015-04-30 ENCOUNTER — Telehealth: Payer: Self-pay | Admitting: Cardiology

## 2015-04-30 NOTE — Telephone Encounter (Signed)
Received records from [facility] for appointment with Dr Stanford Breed on 10/171/6.  Records given to [facility] (medical records) for Dr Jacalyn Lefevre schedule on 06/02/15. lp

## 2015-05-07 ENCOUNTER — Encounter: Payer: Self-pay | Admitting: Internal Medicine

## 2015-05-07 ENCOUNTER — Encounter: Payer: Medicare Other | Admitting: Internal Medicine

## 2015-05-12 ENCOUNTER — Ambulatory Visit: Payer: Medicare Other | Admitting: Cardiovascular Disease

## 2015-05-16 DIAGNOSIS — Z940 Kidney transplant status: Secondary | ICD-10-CM | POA: Diagnosis not present

## 2015-05-19 DIAGNOSIS — E1129 Type 2 diabetes mellitus with other diabetic kidney complication: Secondary | ICD-10-CM | POA: Diagnosis not present

## 2015-05-19 DIAGNOSIS — Z940 Kidney transplant status: Secondary | ICD-10-CM | POA: Diagnosis not present

## 2015-05-19 DIAGNOSIS — I129 Hypertensive chronic kidney disease with stage 1 through stage 4 chronic kidney disease, or unspecified chronic kidney disease: Secondary | ICD-10-CM | POA: Diagnosis not present

## 2015-05-20 DIAGNOSIS — Z940 Kidney transplant status: Secondary | ICD-10-CM | POA: Diagnosis not present

## 2015-05-20 DIAGNOSIS — D649 Anemia, unspecified: Secondary | ICD-10-CM | POA: Diagnosis not present

## 2015-05-28 DIAGNOSIS — I10 Essential (primary) hypertension: Secondary | ICD-10-CM | POA: Diagnosis not present

## 2015-05-28 DIAGNOSIS — Z940 Kidney transplant status: Secondary | ICD-10-CM | POA: Diagnosis not present

## 2015-05-28 DIAGNOSIS — Z6827 Body mass index (BMI) 27.0-27.9, adult: Secondary | ICD-10-CM | POA: Diagnosis not present

## 2015-05-28 DIAGNOSIS — E1129 Type 2 diabetes mellitus with other diabetic kidney complication: Secondary | ICD-10-CM | POA: Diagnosis not present

## 2015-05-30 ENCOUNTER — Encounter: Payer: Self-pay | Admitting: *Deleted

## 2015-05-30 NOTE — Progress Notes (Signed)
HPI: 44 year old male for evaluation of cardiomyopathy. Patient has been seen in this office previously but not since July 2013. Echo in July of 2011 revealed an ejection fraction of 30-35%, trace MR and TR. Previous cath in 2010 revealed normal coronary arteries. A TSH, ferritin, HIV, and ACE level were normal. A rheumatoid factor and ANA were also normal. He was treated medically for nonischemic cardiomyopathy. Echocardiogram repeated in July of 2012 and showed an ejection fraction of 50-55%. I last saw him in June of 2012. Since then, the patient denies any dyspnea on exertion, orthopnea, PND, pedal edema, palpitations, syncope or chest pain.  Current Outpatient Prescriptions  Medication Sig Dispense Refill  . aspirin EC 81 MG tablet Take 81 mg by mouth daily.    . B Complex-C-Folic Acid (RENA-VITE PO) Take 1 tablet by mouth daily.    . Blood Glucose Monitoring Suppl (ACCU-CHEK NANO SMARTVIEW) W/DEVICE KIT Use as instructed 1 kit 0  . calcium carbonate (OS-CAL - DOSED IN MG OF ELEMENTAL CALCIUM) 1250 MG tablet Take 1 tablet by mouth 3 (three) times daily with meals.    . carvedilol (COREG CR) 10 MG 24 hr capsule Take 10 mg by mouth daily.    . dapsone 25 MG tablet Take 50 mg by mouth daily.    Marland Kitchen docusate sodium (DULCOLAX) 100 MG capsule Take 100 mg by mouth daily as needed. For constipation    . glucose blood (FREESTYLE LITE) test strip Check blood sugar 4 times a day before meals and bed. diag code E11.9. Insulin dependent 125 each 2  . HYDROcodone-acetaminophen (NORCO/VICODIN) 5-325 MG per tablet Take 1 tablet by mouth every 8 (eight) hours as needed for moderate pain. 20 tablet 0  . insulin aspart (NOVOLOG) 100 UNIT/ML FlexPen Inject 12 units with breakfast, 12 units with lunch, 12 units with supper 45 mL 3  . Insulin Glargine (LANTUS) 100 UNIT/ML Solostar Pen Inject 32 Units into the skin at bedtime. 30 pen 3  . Insulin Pen Needle (PEN NEEDLES) 31G X 8 MM MISC Use 4 times daily to  inject insulin into the skin. diag codeE11.9. Insulin dependent 130 each 6  . ketoconazole (NIZORAL) 2 % shampoo Apply topically 2 (two) times a week. Wash hair and beard.  Leave shampoo on skin for 5 mins before washing off. 120 mL 1  . Lancets (FREESTYLE) lancets Check blood sugar 4 times a day before meals and bed. diag code E11.9. Insulin dependent 200 each 5  . lovastatin (MEVACOR) 20 MG tablet Take 1 tablet (20 mg total) by mouth daily. 30 tablet 11  . mycophenolate (MYFORTIC) 180 MG EC tablet Take 720 mg by mouth 2 (two) times daily.    Marland Kitchen omeprazole (PRILOSEC) 20 MG capsule Take 20 mg by mouth daily.    . predniSONE (DELTASONE) 5 MG tablet Take 5 mg by mouth daily with breakfast.    . promethazine (PHENERGAN) 25 MG tablet Take 25 mg by mouth every 8 (eight) hours as needed. For nausea/vomiting    . tacrolimus (PROGRAF) 1 MG capsule Take 8 mg by mouth 2 (two) times daily.    . traMADol (ULTRAM) 50 MG tablet Take 1 tablet (50 mg total) by mouth every 6 (six) hours as needed. 15 tablet 0  . triamcinolone (KENALOG) 0.025 % ointment Apply topically 2 (two) times daily. Apply to nose and beard, in areas of dry skin 80 g 1  . valACYclovir (VALTREX) 1000 MG tablet Take 1 tablet (1,000 mg total)  by mouth 2 (two) times daily. 14 tablet 0  . valGANciclovir (VALCYTE) 450 MG tablet Take 450 mg by mouth every Monday, Wednesday, and Friday.     No current facility-administered medications for this visit.    Allergies  Allergen Reactions  . Tape Other (See Comments)    Only tolerated paper tape    Past Medical History  Diagnosis Date  . Non-ischemic cardiomyopathy (HCC)     EF 20-25% 12/23/2008>>EF 40-45% 03/2009>>EF ??? 7/?/2011  . ESRD (end stage renal disease) on dialysis ([place])     1.Initiated HD via right per cate 12/30/2008 (MWF schedule at [facility]) 2.Left upper extremity A-V fistula by Dr Oneida Alar 12/30/2008 3.Aemia: Received Venofer 12/30/08 Ferritin 179 % sat 12 12/23/08, 4. Secondary  Hyperparathyroidism- PTH 188, P 5.4, Ca 9.2 12/2008  . Diabetes mellitus 12/2008    A1C 8.3 12/23/08, 24hr urine protein 2793 mg/day, SPEP neg, proliferative BDR s/p photocoagulation 08/2009 done at [facility] and f/u by DR Groat  . Hypertension     resloved after initating HD, now with post HD hypotension  . Hemodialysis-associated hypotension     coreg d/c'd  . Anemia   . CHF (congestive heart failure) ([place])   . Complication of anesthesia   . PONV (postoperative nausea and vomiting)   . History of blood transfusion   . Psoriasis     scalp    Past Surgical History  Procedure Laterality Date  . Cataract extraction, bilateral    . Av fistula placement      left  . Photocoagulation      for diabetic retinopathy 08/1999  . Insertion of dialysis catheter  07/14/2012    Procedure: INSERTION OF DIALYSIS CATHETER;  Surgeon: Mal Misty, MD;  Location: [facility];  Service: Vascular;  Laterality: Right;  Ultrasound Guided Insertion of Internal Jugular Dialysis Catheter  . Eye surgery    . Kidney transplant  2013    did not work  . Nephrectomy transplanted organ  08/11/12  . Av fistula placement  09/12/2012    Procedure: INSERTION OF ARTERIOVENOUS (AV) GORE-TEX GRAFT ARM;  Surgeon: Elam Dutch, MD;  Location: [facility];  Service: Vascular;  Laterality: Left;  KIDNEY TRANSPLANT FAILED  . Shuntogram N/A 07/11/2012    Procedure: Earney Mallet;  Surgeon: Serafina Mitchell, MD;  Location: [facility] CATH LAB;  Service: Cardiovascular;  Laterality: N/A;    Social History   Social History  . Marital Status: Married    Spouse Name: N/A  . Number of Children: N/A  . Years of Education: N/A   Occupational History  . Not on file.   Social History Main Topics  . Smoking status: Never Smoker   . Smokeless tobacco: Never Used  . Alcohol Use: No  . Drug Use: No  . Sexual Activity: Not on file   Other Topics Concern  . Not on file   Social History Narrative   Has two children. No risky sexual behavior.     Family History  Problem Relation Age of Onset  . Hypertension Mother   . Diabetes Father   . Kidney disease Father   . Other Father     amputation  . Hypertension Brother   . Bleeding Disorder Brother   . CAD Neg Hx   . Sudden Cardiac Death Neg Hx   . Congestive Heart Failure Neg Hx     ROS: no fevers or chills, productive cough, hemoptysis, dysphasia, odynophagia, melena, hematochezia, dysuria, hematuria, rash, seizure activity, orthopnea,  PND, pedal edema, claudication. Remaining systems are negative.  Physical Exam:   There were no vitals taken for this visit.  General:  Well developed/well nourished in NAD Skin warm/dry Patient not depressed No peripheral clubbing Back-normal HEENT-normal/normal eyelids Neck supple/normal carotid upstroke bilaterally; no bruits; no JVD; no thyromegaly chest - CTA/ normal expansion CV - RRR/normal S1 and S2; no murmurs, rubs or gallops;  PMI nondisplaced Abdomen -NT/ND, no HSM, no mass, + bowel sounds, no bruit 2+ femoral pulses, no bruits Ext-no edema, chords, 2+ DP Neuro-grossly nonfocal  ECG    This encounter was created in error - please disregard.

## 2015-06-02 ENCOUNTER — Encounter: Payer: Medicare Other | Admitting: Cardiology

## 2015-06-24 DIAGNOSIS — Z940 Kidney transplant status: Secondary | ICD-10-CM | POA: Diagnosis not present

## 2015-07-14 NOTE — Progress Notes (Signed)
HPI: Evaluate cardiomyopathy. Patient seen in this office previously but not since July 2013. Previous cath in 2010 revealed normal coronary arteries. A TSH, ferritin, HIV, and ACE level were normal. A rheumatoid factor and ANA were also normal. He was treated medically for nonischemic cardiomyopathy. Echo in July of 2011 revealed an ejection fraction of 30-35%, trace MR and TR. Echocardiogram July 2013 showed ejection fraction 45-50%. Stress echocardiogram June 2013 prior to renal transplant showed a baseline ejection fraction of 45% but no ischemia. Nuclear study July 2013 showed ejection fraction 58% and small reversible apical defect treated medically. the patient denies any dyspnea on exertion, orthopnea, PND, pedal edema, palpitations, syncope or chest pain.   Current Outpatient Prescriptions  Medication Sig Dispense Refill  . aspirin EC 81 MG tablet Take 81 mg by mouth daily.    . B Complex-C-Folic Acid (RENA-VITE PO) Take 1 tablet by mouth daily.    . Blood Glucose Monitoring Suppl (ACCU-CHEK NANO SMARTVIEW) W/DEVICE KIT Use as instructed 1 kit 0  . calcitRIOL (ROCALTROL) 0.5 MCG capsule Take 1 capsule by mouth every other day.  3  . calcium carbonate (OS-CAL - DOSED IN MG OF ELEMENTAL CALCIUM) 1250 MG tablet Take 1 tablet by mouth 3 (three) times daily with meals.    . carvedilol (COREG) 12.5 MG tablet Take 12.5 mg by mouth 2 (two) times daily.    . dapsone 25 MG tablet Take 50 mg by mouth daily.    . folic acid (FOLVITE) 1 MG tablet Take 1 tablet by mouth daily.  6  . glucose blood (FREESTYLE LITE) test strip Check blood sugar 4 times a day before meals and bed. diag code E11.9. Insulin dependent 125 each 2  . insulin aspart (NOVOLOG) 100 UNIT/ML FlexPen Inject 12 units with breakfast, 12 units with lunch, 12 units with supper 45 mL 3  . Insulin Pen Needle (PEN NEEDLES) 31G X 8 MM MISC Use 4 times daily to inject insulin into the skin. diag codeE11.9. Insulin dependent 130 each 6  .  ketoconazole (NIZORAL) 2 % shampoo Apply topically 2 (two) times a week. Wash hair and beard.  Leave shampoo on skin for 5 mins before washing off. 120 mL 1  . Lancets (FREESTYLE) lancets Check blood sugar 4 times a day before meals and bed. diag code E11.9. Insulin dependent 200 each 5  . magnesium oxide (MAG-OX) 400 MG tablet Take 1 tablet by mouth 2 (two) times daily.  6  . mycophenolate (MYFORTIC) 180 MG EC tablet Take 720 mg by mouth 2 (two) times daily.    Marland Kitchen omeprazole (PRILOSEC) 20 MG capsule Take 20 mg by mouth daily.    . predniSONE (DELTASONE) 5 MG tablet Take 5 mg by mouth daily with breakfast.    . sodium bicarbonate 650 MG tablet Take 2 tablets by mouth daily.  6  . tacrolimus (PROGRAF) 1 MG capsule Take 8 mg by mouth 2 (two) times daily.    . TRESIBA FLEXTOUCH 200 UNIT/ML SOPN Inject 32 Units into the skin daily.  10  . triamcinolone (KENALOG) 0.025 % ointment Apply topically 2 (two) times daily. Apply to nose and beard, in areas of dry skin 80 g 1   No current facility-administered medications for this visit.    Allergies  Allergen Reactions  . Other Other (See Comments)    Soy milk burns his tongue  . Pollen Extract Other (See Comments)    Itching, watery eyes and sneezing.  . Tape  Other (See Comments)    Only tolerated paper tape     Past Medical History  Diagnosis Date  . Non-ischemic cardiomyopathy (HCC)     EF 20-25% 12/23/2008>>EF 40-45% 03/2009>>EF ??? 7/?/2011  . ESRD (end stage renal disease) on dialysis ([place])     1.Initiated HD via right per cate 12/30/2008 (MWF schedule at [facility]) 2.Left upper extremity A-V fistula by Dr Oneida Alar 12/30/2008 3.Aemia: Received Venofer 12/30/08 Ferritin 179 % sat 12 12/23/08, 4. Secondary Hyperparathyroidism- PTH 188, P 5.4, Ca 9.2 12/2008  . Diabetes mellitus 12/2008    A1C 8.3 12/23/08, 24hr urine protein 2793 mg/day, SPEP neg, proliferative BDR s/p photocoagulation 08/2009 done at [facility] and f/u by DR Groat  . Hypertension      resloved after initating HD, now with post HD hypotension  . Hemodialysis-associated hypotension     coreg d/c'd  . Anemia   . CHF (congestive heart failure) ([place])   . History of blood transfusion   . Psoriasis     scalp    Past Surgical History  Procedure Laterality Date  . Cataract extraction, bilateral    . Av fistula placement      left  . Photocoagulation      for diabetic retinopathy 08/1999  . Insertion of dialysis catheter  07/14/2012    Procedure: INSERTION OF DIALYSIS CATHETER;  Surgeon: Mal Misty, MD;  Location: [facility];  Service: Vascular;  Laterality: Right;  Ultrasound Guided Insertion of Internal Jugular Dialysis Catheter  . Eye surgery    . Kidney transplant  2013    did not work  . Nephrectomy transplanted organ  08/11/12  . Av fistula placement  09/12/2012    Procedure: INSERTION OF ARTERIOVENOUS (AV) GORE-TEX GRAFT ARM;  Surgeon: Elam Dutch, MD;  Location: [facility];  Service: Vascular;  Laterality: Left;  KIDNEY TRANSPLANT FAILED  . Shuntogram N/A 07/11/2012    Procedure: Earney Mallet;  Surgeon: Serafina Mitchell, MD;  [facility] C[facility] LAB;  Service: Cardiovascular;  Laterality: N/A;    Social History   Social History  . Marital Status: Married    Spouse Name: N/A  . Number of Children: 2  . Years of Education: N/A   Occupational History  . Not on file.   Social History Main Topics  . Smoking status: Never Smoker   . Smokeless tobacco: Never Used  . Alcohol Use: No  . Drug Use: No  . Sexual Activity: Not on file   Other Topics Concern  . Not on file   Social History Narrative   Has two children. No risky sexual behavior.    Family History  Problem Relation Age of Onset  . Hypertension Mother   . Diabetes Father   . Kidney disease Father   . Other Father     amputation  . Hypertension Brother   . Bleeding Disorder Brother   . CAD Neg Hx   . Sudden Cardiac Death Neg Hx   . Congestive Heart Failure Neg Hx     ROS: no fevers or  chills, productive cough, hemoptysis, dysphasia, odynophagia, melena, hematochezia, dysuria, hematuria, rash, seizure activity, orthopnea, PND, pedal edema, claudication. Remaining systems are negative.  Physical Exam:   Blood pressure 126/74, pulse 66, height 5' 5"  (1.651 m), weight 161 lb 8 oz (73.256 kg).  General:  Well developed/well nourished in NAD Skin warm/dry Patient not depressed No peripheral clubbing Back-normal HEENT-normal/normal eyelids Neck supple/normal carotid upstroke bilaterally; no bruits; no JVD; no thyromegaly  chest - CTA/ normal expansion CV - RRR/normal S1 and S2; no murmurs, rubs or gallops;  PMI nondisplaced Abdomen -NT/ND, no HSM, no mass, + bowel sounds, no bruit Previous incision from prior transplant. 2+ femoral pulses, no bruits,  Ext-no edema, chords, 2+ DP, Fistula left upper extremity. Neuro-grossly nonfocal  ECG Sinus rhythm at a rate of 66. No ST changes.

## 2015-07-16 ENCOUNTER — Encounter: Payer: Self-pay | Admitting: *Deleted

## 2015-07-16 ENCOUNTER — Ambulatory Visit (INDEPENDENT_AMBULATORY_CARE_PROVIDER_SITE_OTHER): Payer: Medicare Other | Admitting: Cardiology

## 2015-07-16 ENCOUNTER — Encounter: Payer: Self-pay | Admitting: Cardiology

## 2015-07-16 VITALS — BP 126/74 | HR 66 | Ht 65.0 in | Wt 161.5 lb

## 2015-07-16 DIAGNOSIS — I420 Dilated cardiomyopathy: Secondary | ICD-10-CM

## 2015-07-16 DIAGNOSIS — I10 Essential (primary) hypertension: Secondary | ICD-10-CM | POA: Diagnosis not present

## 2015-07-16 DIAGNOSIS — R9430 Abnormal result of cardiovascular function study, unspecified: Secondary | ICD-10-CM | POA: Insufficient documentation

## 2015-07-16 NOTE — Assessment & Plan Note (Signed)
Blood pressure controlled. Continue present medications. 

## 2015-07-16 NOTE — Assessment & Plan Note (Signed)
LV function improved on most recent echocardiogram. Plan repeat study. Continue carvedilol.

## 2015-07-16 NOTE — Assessment & Plan Note (Signed)
Previous nuclear study showed small reversible apical defect. This was treated medically. Continue aspirin. He is not having chest pain. We will consider repeating this in the future.

## 2015-07-16 NOTE — Patient Instructions (Signed)

## 2015-07-23 DIAGNOSIS — I10 Essential (primary) hypertension: Secondary | ICD-10-CM | POA: Diagnosis not present

## 2015-07-23 DIAGNOSIS — E1140 Type 2 diabetes mellitus with diabetic neuropathy, unspecified: Secondary | ICD-10-CM | POA: Diagnosis not present

## 2015-07-23 DIAGNOSIS — E1139 Type 2 diabetes mellitus with other diabetic ophthalmic complication: Secondary | ICD-10-CM | POA: Diagnosis not present

## 2015-07-23 DIAGNOSIS — Z6827 Body mass index (BMI) 27.0-27.9, adult: Secondary | ICD-10-CM | POA: Diagnosis not present

## 2015-07-25 ENCOUNTER — Other Ambulatory Visit (HOSPITAL_COMMUNITY): Payer: Medicare Other

## 2015-07-30 DIAGNOSIS — D849 Immunodeficiency, unspecified: Secondary | ICD-10-CM | POA: Diagnosis not present

## 2015-07-30 DIAGNOSIS — Z6827 Body mass index (BMI) 27.0-27.9, adult: Secondary | ICD-10-CM | POA: Diagnosis not present

## 2015-07-30 DIAGNOSIS — E1129 Type 2 diabetes mellitus with other diabetic kidney complication: Secondary | ICD-10-CM | POA: Diagnosis not present

## 2015-07-30 DIAGNOSIS — I5022 Chronic systolic (congestive) heart failure: Secondary | ICD-10-CM | POA: Diagnosis not present

## 2015-07-30 DIAGNOSIS — E784 Other hyperlipidemia: Secondary | ICD-10-CM | POA: Diagnosis not present

## 2015-07-30 DIAGNOSIS — E1139 Type 2 diabetes mellitus with other diabetic ophthalmic complication: Secondary | ICD-10-CM | POA: Diagnosis not present

## 2015-07-30 DIAGNOSIS — I10 Essential (primary) hypertension: Secondary | ICD-10-CM | POA: Diagnosis not present

## 2015-07-30 DIAGNOSIS — E1140 Type 2 diabetes mellitus with diabetic neuropathy, unspecified: Secondary | ICD-10-CM | POA: Diagnosis not present

## 2015-07-30 DIAGNOSIS — Z940 Kidney transplant status: Secondary | ICD-10-CM | POA: Diagnosis not present

## 2015-08-08 ENCOUNTER — Other Ambulatory Visit (HOSPITAL_COMMUNITY): Payer: Medicare Other

## 2015-08-14 ENCOUNTER — Ambulatory Visit (HOSPITAL_COMMUNITY): Payer: Medicare Other | Attending: Cardiovascular Disease

## 2015-08-14 ENCOUNTER — Other Ambulatory Visit: Payer: Self-pay

## 2015-08-14 DIAGNOSIS — I10 Essential (primary) hypertension: Secondary | ICD-10-CM | POA: Diagnosis not present

## 2015-08-14 DIAGNOSIS — I420 Dilated cardiomyopathy: Secondary | ICD-10-CM | POA: Diagnosis not present

## 2015-08-14 DIAGNOSIS — I429 Cardiomyopathy, unspecified: Secondary | ICD-10-CM | POA: Diagnosis present

## 2015-08-14 DIAGNOSIS — E119 Type 2 diabetes mellitus without complications: Secondary | ICD-10-CM | POA: Diagnosis not present

## 2015-08-20 DIAGNOSIS — Z940 Kidney transplant status: Secondary | ICD-10-CM | POA: Diagnosis not present

## 2015-08-20 DIAGNOSIS — E1122 Type 2 diabetes mellitus with diabetic chronic kidney disease: Secondary | ICD-10-CM | POA: Diagnosis not present

## 2015-08-20 DIAGNOSIS — I129 Hypertensive chronic kidney disease with stage 1 through stage 4 chronic kidney disease, or unspecified chronic kidney disease: Secondary | ICD-10-CM | POA: Diagnosis not present

## 2015-09-17 DIAGNOSIS — E1140 Type 2 diabetes mellitus with diabetic neuropathy, unspecified: Secondary | ICD-10-CM | POA: Diagnosis not present

## 2015-09-17 DIAGNOSIS — I10 Essential (primary) hypertension: Secondary | ICD-10-CM | POA: Diagnosis not present

## 2016-02-13 DIAGNOSIS — Z940 Kidney transplant status: Secondary | ICD-10-CM | POA: Diagnosis not present

## 2016-02-13 DIAGNOSIS — E1122 Type 2 diabetes mellitus with diabetic chronic kidney disease: Secondary | ICD-10-CM | POA: Diagnosis not present

## 2016-02-13 DIAGNOSIS — E669 Obesity, unspecified: Secondary | ICD-10-CM | POA: Diagnosis not present

## 2016-02-13 DIAGNOSIS — I129 Hypertensive chronic kidney disease with stage 1 through stage 4 chronic kidney disease, or unspecified chronic kidney disease: Secondary | ICD-10-CM | POA: Diagnosis not present

## 2016-03-09 DIAGNOSIS — Z794 Long term (current) use of insulin: Secondary | ICD-10-CM | POA: Diagnosis not present

## 2016-03-09 DIAGNOSIS — Z79899 Other long term (current) drug therapy: Secondary | ICD-10-CM | POA: Diagnosis not present

## 2016-03-09 DIAGNOSIS — I509 Heart failure, unspecified: Secondary | ICD-10-CM | POA: Diagnosis not present

## 2016-03-09 DIAGNOSIS — E1122 Type 2 diabetes mellitus with diabetic chronic kidney disease: Secondary | ICD-10-CM | POA: Diagnosis not present

## 2016-03-09 DIAGNOSIS — E1140 Type 2 diabetes mellitus with diabetic neuropathy, unspecified: Secondary | ICD-10-CM | POA: Diagnosis not present

## 2016-03-09 DIAGNOSIS — I130 Hypertensive heart and chronic kidney disease with heart failure and stage 1 through stage 4 chronic kidney disease, or unspecified chronic kidney disease: Secondary | ICD-10-CM | POA: Diagnosis not present

## 2016-03-09 DIAGNOSIS — D631 Anemia in chronic kidney disease: Secondary | ICD-10-CM | POA: Diagnosis not present

## 2016-03-09 DIAGNOSIS — N186 End stage renal disease: Secondary | ICD-10-CM | POA: Diagnosis not present

## 2016-03-09 DIAGNOSIS — D8989 Other specified disorders involving the immune mechanism, not elsewhere classified: Secondary | ICD-10-CM | POA: Diagnosis not present

## 2016-03-09 DIAGNOSIS — D696 Thrombocytopenia, unspecified: Secondary | ICD-10-CM | POA: Diagnosis not present

## 2016-03-09 DIAGNOSIS — E113519 Type 2 diabetes mellitus with proliferative diabetic retinopathy with macular edema, unspecified eye: Secondary | ICD-10-CM | POA: Diagnosis not present

## 2016-03-09 DIAGNOSIS — Z940 Kidney transplant status: Secondary | ICD-10-CM | POA: Diagnosis not present

## 2016-03-09 DIAGNOSIS — Z905 Acquired absence of kidney: Secondary | ICD-10-CM | POA: Diagnosis not present

## 2016-03-09 DIAGNOSIS — Z4822 Encounter for aftercare following kidney transplant: Secondary | ICD-10-CM | POA: Diagnosis not present

## 2016-03-09 DIAGNOSIS — I429 Cardiomyopathy, unspecified: Secondary | ICD-10-CM | POA: Diagnosis not present

## 2016-03-09 DIAGNOSIS — Z7982 Long term (current) use of aspirin: Secondary | ICD-10-CM | POA: Diagnosis not present

## 2016-05-27 ENCOUNTER — Encounter: Payer: Self-pay | Admitting: Nurse Practitioner

## 2016-05-27 DIAGNOSIS — I10 Essential (primary) hypertension: Secondary | ICD-10-CM | POA: Diagnosis not present

## 2016-05-27 DIAGNOSIS — E1140 Type 2 diabetes mellitus with diabetic neuropathy, unspecified: Secondary | ICD-10-CM | POA: Diagnosis not present

## 2016-05-27 DIAGNOSIS — Z125 Encounter for screening for malignant neoplasm of prostate: Secondary | ICD-10-CM | POA: Diagnosis not present

## 2016-05-27 DIAGNOSIS — E784 Other hyperlipidemia: Secondary | ICD-10-CM | POA: Diagnosis not present

## 2016-05-27 LAB — CBC AND DIFFERENTIAL
HEMATOCRIT: 32 — AB (ref 41–53)
HEMOGLOBIN: 10.4 — AB (ref 13.5–17.5)
Platelets: 98 — AB (ref 150–399)
WBC: 6.9

## 2016-05-27 LAB — TSH: TSH: 2.07 (ref 0.41–5.90)

## 2016-05-27 LAB — LIPID PANEL
Cholesterol: 286 — AB (ref 0–200)
HDL: 36 (ref 35–70)
LDL Cholesterol: 162
Triglycerides: 442 — AB (ref 40–160)

## 2016-05-27 LAB — HEMOGLOBIN A1C: HEMOGLOBIN A1C: 14.1

## 2016-05-27 LAB — BASIC METABOLIC PANEL
BUN: 32 — AB (ref 4–21)
Creatinine: 2.9 — AB (ref 0.6–1.3)
GLUCOSE: 351
Potassium: 3.1 — AB (ref 3.4–5.3)
SODIUM: 136 — AB (ref 137–147)

## 2016-06-10 DIAGNOSIS — E669 Obesity, unspecified: Secondary | ICD-10-CM | POA: Diagnosis not present

## 2016-06-10 DIAGNOSIS — N2581 Secondary hyperparathyroidism of renal origin: Secondary | ICD-10-CM | POA: Diagnosis not present

## 2016-06-10 DIAGNOSIS — Z940 Kidney transplant status: Secondary | ICD-10-CM | POA: Diagnosis not present

## 2016-06-10 DIAGNOSIS — E1122 Type 2 diabetes mellitus with diabetic chronic kidney disease: Secondary | ICD-10-CM | POA: Diagnosis not present

## 2016-06-10 DIAGNOSIS — I129 Hypertensive chronic kidney disease with stage 1 through stage 4 chronic kidney disease, or unspecified chronic kidney disease: Secondary | ICD-10-CM | POA: Diagnosis not present

## 2016-06-11 ENCOUNTER — Ambulatory Visit (HOSPITAL_COMMUNITY)
Admission: RE | Admit: 2016-06-11 | Discharge: 2016-06-11 | Disposition: A | Discharge status: Home / Self Care | Payer: Medicare Other | Source: Ambulatory Visit | Attending: Internal Medicine | Admitting: Internal Medicine

## 2016-06-11 ENCOUNTER — Other Ambulatory Visit (HOSPITAL_COMMUNITY): Payer: Self-pay | Admitting: Internal Medicine

## 2016-06-11 DIAGNOSIS — Z1389 Encounter for screening for other disorder: Secondary | ICD-10-CM | POA: Diagnosis not present

## 2016-06-11 DIAGNOSIS — Z6824 Body mass index (BMI) 24.0-24.9, adult: Secondary | ICD-10-CM | POA: Diagnosis not present

## 2016-06-11 DIAGNOSIS — Z940 Kidney transplant status: Secondary | ICD-10-CM | POA: Diagnosis not present

## 2016-06-11 DIAGNOSIS — D509 Iron deficiency anemia, unspecified: Secondary | ICD-10-CM | POA: Diagnosis not present

## 2016-06-11 DIAGNOSIS — R29898 Other symptoms and signs involving the musculoskeletal system: Secondary | ICD-10-CM

## 2016-06-11 DIAGNOSIS — Z125 Encounter for screening for malignant neoplasm of prostate: Secondary | ICD-10-CM | POA: Diagnosis not present

## 2016-06-11 DIAGNOSIS — E784 Other hyperlipidemia: Secondary | ICD-10-CM | POA: Diagnosis not present

## 2016-06-11 DIAGNOSIS — R200 Anesthesia of skin: Secondary | ICD-10-CM | POA: Diagnosis not present

## 2016-06-11 DIAGNOSIS — I5022 Chronic systolic (congestive) heart failure: Secondary | ICD-10-CM | POA: Diagnosis not present

## 2016-06-11 DIAGNOSIS — D848 Other specified immunodeficiencies: Secondary | ICD-10-CM | POA: Diagnosis not present

## 2016-06-11 DIAGNOSIS — I428 Other cardiomyopathies: Secondary | ICD-10-CM | POA: Diagnosis not present

## 2016-06-11 DIAGNOSIS — Z23 Encounter for immunization: Secondary | ICD-10-CM | POA: Diagnosis not present

## 2016-06-11 DIAGNOSIS — I10 Essential (primary) hypertension: Secondary | ICD-10-CM | POA: Diagnosis not present

## 2016-06-11 DIAGNOSIS — Z0000 Encounter for general adult medical examination without abnormal findings: Secondary | ICD-10-CM | POA: Diagnosis not present

## 2016-06-11 LAB — MICROALBUMIN, URINE: MICROALB UR: 9

## 2016-08-20 DIAGNOSIS — Z940 Kidney transplant status: Secondary | ICD-10-CM | POA: Diagnosis not present

## 2016-08-26 DIAGNOSIS — I129 Hypertensive chronic kidney disease with stage 1 through stage 4 chronic kidney disease, or unspecified chronic kidney disease: Secondary | ICD-10-CM | POA: Diagnosis not present

## 2016-08-26 DIAGNOSIS — D696 Thrombocytopenia, unspecified: Secondary | ICD-10-CM | POA: Diagnosis not present

## 2016-08-26 DIAGNOSIS — J069 Acute upper respiratory infection, unspecified: Secondary | ICD-10-CM | POA: Diagnosis not present

## 2016-08-26 DIAGNOSIS — E669 Obesity, unspecified: Secondary | ICD-10-CM | POA: Diagnosis not present

## 2016-08-26 DIAGNOSIS — E1122 Type 2 diabetes mellitus with diabetic chronic kidney disease: Secondary | ICD-10-CM | POA: Diagnosis not present

## 2016-08-26 DIAGNOSIS — Z940 Kidney transplant status: Secondary | ICD-10-CM | POA: Diagnosis not present

## 2016-08-26 DIAGNOSIS — Z6827 Body mass index (BMI) 27.0-27.9, adult: Secondary | ICD-10-CM | POA: Diagnosis not present

## 2016-08-26 DIAGNOSIS — D631 Anemia in chronic kidney disease: Secondary | ICD-10-CM | POA: Diagnosis not present

## 2016-08-26 LAB — BASIC METABOLIC PANEL
BUN: 12 (ref 4–21)
Creatinine: 3.3 — AB (ref 0.6–1.3)
GLUCOSE: 473
Potassium: 3.6 (ref 3.4–5.3)
SODIUM: 133 — AB (ref 137–147)

## 2016-08-26 LAB — IRON,TIBC AND FERRITIN PANEL
FERRITIN: 3610
IRON: 91
TIBC: 183
UIBC: 92

## 2016-09-17 DIAGNOSIS — Z0000 Encounter for general adult medical examination without abnormal findings: Secondary | ICD-10-CM | POA: Diagnosis not present

## 2017-02-07 ENCOUNTER — Observation Stay (HOSPITAL_COMMUNITY)
Admission: EM | Admit: 2017-02-07 | Discharge: 2017-02-09 | Disposition: A | Discharge status: Home / Self Care | Payer: Medicare Other | Attending: Internal Medicine | Admitting: Internal Medicine

## 2017-02-07 ENCOUNTER — Encounter (HOSPITAL_COMMUNITY): Payer: Self-pay | Admitting: Emergency Medicine

## 2017-02-07 DIAGNOSIS — E1122 Type 2 diabetes mellitus with diabetic chronic kidney disease: Secondary | ICD-10-CM | POA: Diagnosis not present

## 2017-02-07 DIAGNOSIS — R531 Weakness: Secondary | ICD-10-CM | POA: Diagnosis present

## 2017-02-07 DIAGNOSIS — N186 End stage renal disease: Secondary | ICD-10-CM | POA: Insufficient documentation

## 2017-02-07 DIAGNOSIS — Z79899 Other long term (current) drug therapy: Secondary | ICD-10-CM | POA: Diagnosis not present

## 2017-02-07 DIAGNOSIS — Z940 Kidney transplant status: Secondary | ICD-10-CM | POA: Diagnosis not present

## 2017-02-07 DIAGNOSIS — I509 Heart failure, unspecified: Secondary | ICD-10-CM | POA: Insufficient documentation

## 2017-02-07 DIAGNOSIS — I10 Essential (primary) hypertension: Secondary | ICD-10-CM | POA: Diagnosis present

## 2017-02-07 DIAGNOSIS — R739 Hyperglycemia, unspecified: Secondary | ICD-10-CM | POA: Diagnosis present

## 2017-02-07 DIAGNOSIS — E871 Hypo-osmolality and hyponatremia: Secondary | ICD-10-CM | POA: Diagnosis not present

## 2017-02-07 DIAGNOSIS — Z992 Dependence on renal dialysis: Secondary | ICD-10-CM | POA: Insufficient documentation

## 2017-02-07 DIAGNOSIS — E875 Hyperkalemia: Secondary | ICD-10-CM | POA: Diagnosis not present

## 2017-02-07 DIAGNOSIS — Z794 Long term (current) use of insulin: Secondary | ICD-10-CM | POA: Insufficient documentation

## 2017-02-07 DIAGNOSIS — Z7982 Long term (current) use of aspirin: Secondary | ICD-10-CM | POA: Diagnosis not present

## 2017-02-07 DIAGNOSIS — E1100 Type 2 diabetes mellitus with hyperosmolarity without nonketotic hyperglycemic-hyperosmolar coma (NKHHC): Secondary | ICD-10-CM | POA: Diagnosis not present

## 2017-02-07 DIAGNOSIS — I132 Hypertensive heart and chronic kidney disease with heart failure and with stage 5 chronic kidney disease, or end stage renal disease: Secondary | ICD-10-CM | POA: Diagnosis not present

## 2017-02-07 DIAGNOSIS — E211 Secondary hyperparathyroidism, not elsewhere classified: Secondary | ICD-10-CM | POA: Diagnosis not present

## 2017-02-07 DIAGNOSIS — E119 Type 2 diabetes mellitus without complications: Secondary | ICD-10-CM

## 2017-02-07 LAB — COMPREHENSIVE METABOLIC PANEL
ALT: 16 U/L — ABNORMAL LOW (ref 17–63)
AST: 14 U/L — ABNORMAL LOW (ref 15–41)
Albumin: 3.8 g/dL (ref 3.5–5.0)
Alkaline Phosphatase: 159 U/L — ABNORMAL HIGH (ref 38–126)
Anion gap: 12 (ref 5–15)
BUN: 36 mg/dL — ABNORMAL HIGH (ref 6–20)
CALCIUM: 9.5 mg/dL (ref 8.9–10.3)
CHLORIDE: 90 mmol/L — AB (ref 101–111)
CO2: 20 mmol/L — ABNORMAL LOW (ref 22–32)
Creatinine, Ser: 2.99 mg/dL — ABNORMAL HIGH (ref 0.61–1.24)
GFR, EST AFRICAN AMERICAN: 27 mL/min — AB (ref >60)
GFR, EST NON AFRICAN AMERICAN: 23 mL/min — AB (ref >60)
Glucose, Bld: 882 mg/dL (ref 65–99)
Potassium: 5.2 mmol/L — ABNORMAL HIGH (ref 3.5–5.1)
Sodium: 122 mmol/L — ABNORMAL LOW (ref 135–145)
Total Bilirubin: 0.6 mg/dL (ref 0.3–1.2)
Total Protein: 6.3 g/dL — ABNORMAL LOW (ref 6.5–8.1)

## 2017-02-07 LAB — CBC WITH DIFFERENTIAL/PLATELET
BASOS PCT: 0 %
Basophils Absolute: 0 10*3/uL (ref 0.0–0.1)
EOS PCT: 1 %
Eosinophils Absolute: 0.1 10*3/uL (ref 0.0–0.7)
HCT: 28.5 % — ABNORMAL LOW (ref 39.0–52.0)
Hemoglobin: 9 g/dL — ABNORMAL LOW (ref 13.0–17.0)
Lymphocytes Relative: 6 %
Lymphs Abs: 0.6 10*3/uL — ABNORMAL LOW (ref 0.7–4.0)
MCH: 26.8 pg (ref 26.0–34.0)
MCHC: 31.6 g/dL (ref 30.0–36.0)
MCV: 84.8 fL (ref 78.0–100.0)
MONO ABS: 0.7 10*3/uL (ref 0.1–1.0)
Monocytes Relative: 7 %
NEUTROS ABS: 8.3 10*3/uL — AB (ref 1.7–7.7)
Neutrophils Relative %: 86 %
Platelets: 142 10*3/uL — ABNORMAL LOW (ref 150–400)
RBC: 3.36 MIL/uL — ABNORMAL LOW (ref 4.22–5.81)
RDW: 14.5 % (ref 11.5–15.5)
WBC MORPHOLOGY: INCREASED
WBC: 9.7 10*3/uL (ref 4.0–10.5)

## 2017-02-07 LAB — I-STAT CG4 LACTIC ACID, ED: LACTIC ACID, VENOUS: 1.8 mmol/L (ref 0.5–1.9)

## 2017-02-07 MED ORDER — SODIUM CHLORIDE 0.9 % IV BOLUS (SEPSIS)
3000.0000 mL | Freq: Once | INTRAVENOUS | Status: AC
  Administered 2017-02-07: 3000 mL via INTRAVENOUS

## 2017-02-07 MED ORDER — DEXTROSE 50 % IV SOLN: 25.0000 mL | INTRAVENOUS | Status: DC | PRN

## 2017-02-07 MED ORDER — SODIUM CHLORIDE 0.9 % IV SOLN
INTRAVENOUS | Status: DC
  Administered 2017-02-07: 5.4 [IU]/h via INTRAVENOUS
  Filled 2017-02-07: qty 1

## 2017-02-07 MED ORDER — DEXTROSE-NACL 5-0.45 % IV SOLN
INTRAVENOUS | Status: DC
  Administered 2017-02-08: 1000 mL via INTRAVENOUS

## 2017-02-07 MED ORDER — SODIUM CHLORIDE 0.9 % IV BOLUS (SEPSIS)
1000.0000 mL | Freq: Once | INTRAVENOUS | Status: AC
  Administered 2017-02-07: 1000 mL via INTRAVENOUS

## 2017-02-07 MED ORDER — MYCOPHENOLATE SODIUM 180 MG PO TBEC
540.0000 mg | DELAYED_RELEASE_TABLET | Freq: Once | ORAL | Status: AC
Start: 2017-02-07 — End: 2017-02-07
  Administered 2017-02-07: 540 mg via ORAL
  Filled 2017-02-07: qty 3

## 2017-02-07 MED ORDER — TACROLIMUS 1 MG PO CAPS
5.0000 mg | ORAL_CAPSULE | Freq: Two times a day (BID) | ORAL | Status: DC
  Administered 2017-02-07 – 2017-02-09 (×4): 5 mg via ORAL
  Filled 2017-02-07 (×6): qty 5

## 2017-02-07 MED ORDER — SODIUM CHLORIDE 0.9 % IV SOLN
INTRAVENOUS | Status: DC
  Administered 2017-02-07: 22:00:00 via INTRAVENOUS

## 2017-02-07 MED ORDER — MYCOPHENOLATE SODIUM 180 MG PO TBEC
180.0000 mg | DELAYED_RELEASE_TABLET | Freq: Once | ORAL | Status: AC
Start: 2017-02-07 — End: 2017-02-07
  Administered 2017-02-07: 180 mg via ORAL
  Filled 2017-02-07: qty 1

## 2017-02-07 NOTE — ED Triage Notes (Signed)
Patient presents with increased weakness x 5-6 weeks. Strength decreasing, affecting ADLs. Denies pain. A&O x 4

## 2017-02-07 NOTE — ED Provider Notes (Signed)
[facility] DEPT Provider Note   CSN: [id number] Arrival date & time: 02/07/17  1532     History   Chief Complaint Chief Complaint  Patient presents with  . Weakness    weight loss, increased fatigue. Kidney transplant 5/15    HPI [name] is a 46 y.o. male.  HPI  Patient with multiple medical issues including prior renal transplant, insulin-dependent diabetes presents with weakness, nausea, anorexia. Patient is here with his wife who assists with the history of present illness. That until about one month ago the patient was generally well. Symptoms have progressed over this month without clear precipitant. Patient does acknowledge inconsistent compliance with his medication including insulin, and states that he rarely checks his blood sugar. When he has checked his blood sugar and has typically been critically abnormal. Now, with worsening generalized weakness, without focal weakness, the patient presents for evaluation.   Past Medical History:  Diagnosis Date  . Anemia   . CHF (congestive heart failure) ([place])   . Diabetes mellitus 12/2008   A1C 8.3 12/23/08, 24hr urine protein 2793 mg/day, SPEP neg, proliferative BDR s/p photocoagulation 08/2009 done at [facility] and f/u by DR Groat  . ESRD (end stage renal disease) on dialysis ([place])    1.Initiated HD via right per cate 12/30/2008 (MWF schedule at [facility]) 2.Left upper extremity A-V fistula by Dr Oneida Alar 12/30/2008 3.Aemia: Received Venofer 12/30/08 Ferritin 179 % sat 12 12/23/08, 4. Secondary Hyperparathyroidism- PTH 188, P 5.4, Ca 9.2 12/2008  . Hemodialysis-associated hypotension    coreg d/c'd  . History of blood transfusion   . Hypertension    resloved after initating HD, now with post HD hypotension  . Non-ischemic cardiomyopathy (HCC)    EF 20-25% 12/23/2008>>EF 40-45% 03/2009>>EF ??? 7/?/2011  . Psoriasis    scalp    Patient Active Problem List   Diagnosis Date Noted  . Congestive dilated  cardiomyopathy ([place]) 07/16/2015  . Abnormal cardiovascular function study 07/16/2015  . Preventative health care 04/24/2014  . Tinea capitis 04/24/2014  . Dental infection 10/16/2013  . Hyperlipidemia LDL goal < 100 08/29/2013  . Proliferative diabetic retinopathy(362.02) 05/24/2013  . 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 DM2 (diabetes mellitus, type 2) ([place]) 01/15/2009  . ANEMIA OF RENAL FAILURE 01/15/2009  . ESSENTIAL HYPERTENSION, BENIGN 01/15/2009  . Dilated cardiomyopathy ([place]) 01/15/2009  . END STAGE RENAL DISEASE 01/15/2009  . SECONDARY HYPERPARATHYROIDISM 01/15/2009    Past Surgical History:  Procedure Laterality Date  . AV FISTULA PLACEMENT     left  . AV FISTULA PLACEMENT  09/12/2012   Procedure: INSERTION OF ARTERIOVENOUS (AV) GORE-TEX GRAFT ARM;  Surgeon: Elam Dutch, MD;  Location: [facility];  Service: Vascular;  Laterality: Left;  KIDNEY TRANSPLANT FAILED  . CATARACT EXTRACTION, BILATERAL    . EYE SURGERY    . INSERTION OF DIALYSIS CATHETER  07/14/2012   Procedure: INSERTION OF DIALYSIS CATHETER;  Surgeon: Mal Misty, MD;  Location: [facility];  Service: Vascular;  Laterality: Right;  Ultrasound Guided Insertion of Internal Jugular Dialysis Catheter  . KIDNEY TRANSPLANT  2013   did not work  . NEPHRECTOMY TRANSPLANTED ORGAN  08/11/12  . PHOTOCOAGULATION     for diabetic retinopathy 08/1999  . SHUNTOGRAM N/A 07/11/2012   Procedure: Earney Mallet;  Surgeon: Serafina Mitchell, MD;  Location: [facility] CATH LAB;  Service: Cardiovascular;  Laterality: N/A;       Home Medications    Prior to Admission medications   Medication Sig Start Date End  Date Taking? Authorizing Provider  amoxicillin (AMOXIL) 500 MG tablet Take 2,000 mg by mouth See admin instructions. ONE HOUR PRIOR TO DENTAL APPOINTMENT 03/09/16  Yes [provider]  aspirin EC 81 MG tablet Take 81 mg by mouth daily.   Yes [provider]  calcitRIOL (ROCALTROL) 0.5 MCG capsule Take 1 capsule by  mouth every Monday, Wednesday, and Friday.  06/03/15  Yes [provider]  calcium carbonate (OS-CAL - DOSED IN MG OF ELEMENTAL CALCIUM) 1250 MG tablet Take 1 tablet by mouth 3 (three) times daily with meals.   Yes [provider]  carvedilol (COREG) 12.5 MG tablet Take 12.5 mg by mouth 2 (two) times daily. 03/07/14  Yes [provider]  dapsone 25 MG tablet Take 50 mg by mouth daily.   Yes [provider]  folic acid (FOLVITE) 1 MG tablet Take 1 mg by mouth daily.  06/24/15  Yes [provider]  insulin aspart (NOVOLOG) 100 UNIT/ML FlexPen Inject 12 units with breakfast, 12 units with lunch, 12 units with supper Patient taking differently: Inject 0-20 Units into the skin 3 (three) times daily before meals. PER SLIDING SCALE 01/29/15  Yes Kelby Aline, MD  ketoconazole (NIZORAL) 2 % shampoo Apply topically 2 (two) times a week. Wash hair and beard.  Leave shampoo on skin for 5 mins before washing off. 04/24/14  Yes Kelby Aline, MD  magnesium oxide (MAG-OX) 400 MG tablet Take 400 mg by mouth daily.  07/01/15  Yes [provider]  mycophenolate (MYFORTIC) 180 MG EC tablet Take 720 mg by mouth 2 (two) times daily.   Yes [provider]  omeprazole (PRILOSEC) 20 MG capsule Take 20 mg by mouth daily.   Yes [provider]  predniSONE (DELTASONE) 5 MG tablet Take 5 mg by mouth daily with breakfast.   Yes [provider]  sodium bicarbonate 650 MG tablet Take 1,300 mg by mouth 3 (three) times daily.  06/24/15  Yes [provider]  tacrolimus (PROGRAF) 1 MG capsule Take 5 mg by mouth 2 (two) times daily.    Yes [provider]  TRESIBA FLEXTOUCH 200 UNIT/ML SOPN Inject 50 Units into the skin at bedtime.  07/01/15  Yes [provider]  Blood Glucose Monitoring Suppl (ACCU-CHEK NANO SMARTVIEW) W/DEVICE KIT Use as instructed 04/24/14   Kelby Aline, MD  glucose blood (FREESTYLE LITE) test strip Check  blood sugar 4 times a day before meals and bed. diag code E11.9. Insulin dependent 12/25/14   Annia Belt, MD  Insulin Pen Needle (PEN NEEDLES) 31G X 8 MM MISC Use 4 times daily to inject insulin into the skin. diag codeE11.9. Insulin dependent 01/29/15   Bartholomew Crews, MD  Lancets (FREESTYLE) lancets Check blood sugar 4 times a day before meals and bed. diag code E11.9. Insulin dependent 12/31/14   Sid Falcon, MD  triamcinolone (KENALOG) 0.025 % ointment Apply topically 2 (two) times daily. Apply to nose and beard, in areas of dry skin Patient not taking: Reported on 02/07/2017 04/24/14   Kelby Aline, MD    Family History Family History  Problem Relation Age of Onset  . Hypertension Mother   . Diabetes Father   . Kidney disease Father   . Other Father        amputation  . Hypertension Brother   . Bleeding Disorder Brother   . CAD Neg Hx   . Sudden Cardiac Death Neg Hx   . Congestive  Heart Failure Neg Hx     Social History Social History  Substance Use Topics  . Smoking status: Never Smoker  . Smokeless tobacco: Never Used  . Alcohol use No     Allergies   Lactose intolerance (gi); Other; Pollen extract; and Tape   Review of Systems Review of Systems  Constitutional:       20 pound weight loss over the month  HENT:       Per HPI, otherwise negative  Respiratory:       Per HPI, otherwise negative  Cardiovascular:       Per HPI, otherwise negative  Gastrointestinal: Positive for nausea. Negative for vomiting.  Endocrine:       Negative aside from HPI  Genitourinary:       Neg aside from HPI   Musculoskeletal:       Per HPI, otherwise negative  Skin: Negative.   Neurological: Positive for weakness. Negative for syncope.     Physical Exam Updated Vital Signs BP 137/69   Pulse 75   Temp 98.5 F (36.9 C)   Resp 14   Ht 5' 5"  (1.651 m)   Wt 57.2 kg (126 lb 1 oz)   SpO2 97%   BMI 20.98 kg/m   Physical Exam  Constitutional: He is  oriented to person, place, and time. He has a sickly appearance. No distress.  HENT:  Head: Normocephalic and atraumatic.  Eyes: Conjunctivae and EOM are normal.  Cardiovascular: Normal rate and regular rhythm.   Pulmonary/Chest: Effort normal. No stridor. No respiratory distress.  Abdominal: He exhibits no distension.  Musculoskeletal: He exhibits no edema.  Neurological: He is oriented to person, place, and time.  Skin: Skin is warm and dry.  Psychiatric: He has a normal mood and affect.  Nursing note and vitals reviewed.    ED Treatments / Results  Labs (all labs ordered are listed, but only abnormal results are displayed) Labs Reviewed  CBC WITH DIFFERENTIAL/PLATELET - Abnormal; Notable for the following:       Result Value   RBC 3.36 (*)    Hemoglobin 9.0 (*)    HCT 28.5 (*)    Platelets 142 (*)    Neutro Abs 8.3 (*)    Lymphs Abs 0.6 (*)    All other components within normal limits  COMPREHENSIVE METABOLIC PANEL - Abnormal; Notable for the following:    Sodium 122 (*)    Potassium 5.2 (*)    Chloride 90 (*)    CO2 20 (*)    Glucose, Bld 882 (*)    BUN 36 (*)    Creatinine, Ser 2.99 (*)    Total Protein 6.3 (*)    AST 14 (*)    ALT 16 (*)    Alkaline Phosphatase 159 (*)    GFR calc non Af Amer 23 (*)    GFR calc Af Amer 27 (*)    All other components within normal limits  I-STAT CG4 LACTIC ACID, ED  I-STAT VENOUS BLOOD GAS, ED    Radiology No results found.  Procedures Procedures (including critical care time)  Medications Ordered in ED Medications  tacrolimus (PROGRAF) capsule 5 mg (5 mg Oral Given 02/07/17 2201)  0.9 %  sodium chloride infusion (not administered)  dextrose 5 %-0.45 % sodium chloride infusion (not administered)  insulin regular (NOVOLIN R,HUMULIN R) 100 Units in sodium chloride 0.9 % 100 mL (1 Units/mL) infusion (5.4 Units/hr Intravenous New Bag/Given 02/07/17 2311)  dextrose 50 % solution 25  mL (not administered)  sodium chloride 0.9 %  bolus 3,000 mL (3,000 mLs Intravenous New Bag/Given 02/07/17 2107)  sodium chloride 0.9 % bolus 1,000 mL (1,000 mLs Intravenous New Bag/Given 02/07/17 2205)  mycophenolate (MYFORTIC) EC tablet 180 mg (180 mg Oral Given 02/07/17 2100)  mycophenolate (MYFORTIC) EC tablet 540 mg (540 mg Oral Given 02/07/17 2200)     Initial Impression / Assessment and Plan / ED Course  I have reviewed the triage vital signs and the nursing notes.  Pertinent labs & imaging results that were available during my care of the patient were reviewed by me and considered in my medical decision making (see chart for details).  Initial glucose critically abnormal, greater than 800. She received empiric fluids, and after remaining labs resulted, with concern for hyperkalemia, elevated creatinine, particularly in light of the patient's prior renal transplant, the patient continued receiving IV fluid resuscitation, and was placed on a insulin drip. Patient's presentation is concerning for hyperosmolar hyperglycemic state, with critical abnormalities, requiring admission to the stepdown unit after initiation of the aforementioned fluids, insulin drip. In addition, the patient was due for multiple antirejection medication, which were provided prior to admission.   Final Clinical Impressions(s) / ED Diagnoses  Hyperosmolar, hyperglycemic state Hyperkalemia Hyponatremia  CRITICAL CARE Performed by: Carmin Muskrat Total critical care time: 35 minutes Critical care time was exclusive of separately billable procedures and treating other patients. Critical care was necessary to treat or prevent imminent or life-threatening deterioration. Critical care was time spent personally by me on the following activities: development of treatment plan with patient and/or surrogate as well as nursing, discussions with consultants, evaluation of patient's response to treatment, examination of patient, obtaining history from patient or  surrogate, ordering and performing treatments and interventions, ordering and review of laboratory studies, ordering and review of radiographic studies, pulse oximetry and re-evaluation of patient's condition.    Carmin Muskrat, MD 02/07/17 [phone]

## 2017-02-07 NOTE — ED Notes (Signed)
Patient has needed iron infusion in the past and says he feels very similar to the last time he needed iron infusion.

## 2017-02-07 NOTE — ED Notes (Signed)
1L NS bag #2 started

## 2017-02-07 NOTE — ED Notes (Addendum)
Date and time results received: 02/07/17 1855 (use smartphrase ".now" to insert current time)  Test: glucose Critical Value: 73  Name of Provider Notified: little Orders Received? Or Actions Taken?: Dr. Rex Kras has placed additional orders and patient will be roomed next

## 2017-02-07 NOTE — ED Notes (Signed)
MD Advises not to give fluids all at once as ordered but at a rate of 1L/Hr 1st L NS started at 2107.

## 2017-02-07 NOTE — ED Notes (Signed)
3rd bag of NS 1L started.

## 2017-02-08 ENCOUNTER — Observation Stay (HOSPITAL_COMMUNITY): Payer: Medicare Other

## 2017-02-08 ENCOUNTER — Encounter (HOSPITAL_COMMUNITY): Payer: Self-pay | Admitting: Emergency Medicine

## 2017-02-08 DIAGNOSIS — I10 Essential (primary) hypertension: Secondary | ICD-10-CM | POA: Diagnosis not present

## 2017-02-08 DIAGNOSIS — E1122 Type 2 diabetes mellitus with diabetic chronic kidney disease: Secondary | ICD-10-CM

## 2017-02-08 DIAGNOSIS — N185 Chronic kidney disease, stage 5: Secondary | ICD-10-CM

## 2017-02-08 DIAGNOSIS — Z794 Long term (current) use of insulin: Secondary | ICD-10-CM

## 2017-02-08 DIAGNOSIS — R739 Hyperglycemia, unspecified: Secondary | ICD-10-CM | POA: Diagnosis not present

## 2017-02-08 DIAGNOSIS — Z940 Kidney transplant status: Secondary | ICD-10-CM | POA: Diagnosis not present

## 2017-02-08 DIAGNOSIS — E1100 Type 2 diabetes mellitus with hyperosmolarity without nonketotic hyperglycemic-hyperosmolar coma (NKHHC): Secondary | ICD-10-CM | POA: Diagnosis present

## 2017-02-08 LAB — BASIC METABOLIC PANEL
Anion gap: 6 (ref 5–15)
BUN: 33 mg/dL — ABNORMAL HIGH (ref 6–20)
CALCIUM: 8 mg/dL — AB (ref 8.9–10.3)
CO2: 19 mmol/L — ABNORMAL LOW (ref 22–32)
Chloride: 110 mmol/L (ref 101–111)
Creatinine, Ser: 2.44 mg/dL — ABNORMAL HIGH (ref 0.61–1.24)
GFR calc Af Amer: 34 mL/min — ABNORMAL LOW (ref >60)
GFR, EST NON AFRICAN AMERICAN: 30 mL/min — AB (ref >60)
Glucose, Bld: 346 mg/dL — ABNORMAL HIGH (ref 65–99)
POTASSIUM: 3.3 mmol/L — AB (ref 3.5–5.1)
SODIUM: 135 mmol/L (ref 135–145)

## 2017-02-08 LAB — COMPREHENSIVE METABOLIC PANEL
ALT: 12 U/L — AB (ref 17–63)
AST: 16 U/L (ref 15–41)
Albumin: 2.9 g/dL — ABNORMAL LOW (ref 3.5–5.0)
Alkaline Phosphatase: 112 U/L (ref 38–126)
Anion gap: 7 (ref 5–15)
BUN: 29 mg/dL — AB (ref 6–20)
CHLORIDE: 110 mmol/L (ref 101–111)
CO2: 18 mmol/L — AB (ref 22–32)
CREATININE: 2.33 mg/dL — AB (ref 0.61–1.24)
Calcium: 8.3 mg/dL — ABNORMAL LOW (ref 8.9–10.3)
GFR calc non Af Amer: 31 mL/min — ABNORMAL LOW (ref >60)
GFR, EST AFRICAN AMERICAN: 36 mL/min — AB (ref >60)
Glucose, Bld: 208 mg/dL — ABNORMAL HIGH (ref 65–99)
POTASSIUM: 3.4 mmol/L — AB (ref 3.5–5.1)
SODIUM: 135 mmol/L (ref 135–145)
Total Bilirubin: 0.4 mg/dL (ref 0.3–1.2)
Total Protein: 5 g/dL — ABNORMAL LOW (ref 6.5–8.1)

## 2017-02-08 LAB — GLUCOSE, CAPILLARY
GLUCOSE-CAPILLARY: 127 mg/dL — AB (ref 65–99)
GLUCOSE-CAPILLARY: 177 mg/dL — AB (ref 65–99)
GLUCOSE-CAPILLARY: 246 mg/dL — AB (ref 65–99)
Glucose-Capillary: 129 mg/dL — ABNORMAL HIGH (ref 65–99)
Glucose-Capillary: 258 mg/dL — ABNORMAL HIGH (ref 65–99)

## 2017-02-08 LAB — CBG MONITORING, ED
GLUCOSE-CAPILLARY: 269 mg/dL — AB (ref 65–99)
GLUCOSE-CAPILLARY: 178 mg/dL — AB (ref 65–99)
Glucose-Capillary: 198 mg/dL — ABNORMAL HIGH (ref 65–99)
Glucose-Capillary: 325 mg/dL — ABNORMAL HIGH (ref 65–99)
Glucose-Capillary: 454 mg/dL — ABNORMAL HIGH (ref 65–99)
Glucose-Capillary: 554 mg/dL (ref 65–99)
Glucose-Capillary: >600 mg/dL (ref 65–99)
Glucose-Capillary: >600 mg/dL (ref 65–99)

## 2017-02-08 LAB — HIV ANTIBODY (ROUTINE TESTING W REFLEX): HIV Screen 4th Generation wRfx: NONREACTIVE

## 2017-02-08 MED ORDER — DAPSONE 25 MG PO TABS
50.0000 mg | ORAL_TABLET | Freq: Every day | ORAL | Status: DC
  Administered 2017-02-08 – 2017-02-09 (×2): 50 mg via ORAL
  Filled 2017-02-08 (×3): qty 2

## 2017-02-08 MED ORDER — MYCOPHENOLATE SODIUM 180 MG PO TBEC
720.0000 mg | DELAYED_RELEASE_TABLET | Freq: Two times a day (BID) | ORAL | Status: DC
  Administered 2017-02-08 – 2017-02-09 (×3): 720 mg via ORAL
  Filled 2017-02-08 (×4): qty 4

## 2017-02-08 MED ORDER — ONDANSETRON HCL 4 MG/2ML IJ SOLN: 4.0000 mg | Freq: Four times a day (QID) | INTRAMUSCULAR | Status: DC | PRN

## 2017-02-08 MED ORDER — SODIUM BICARBONATE 650 MG PO TABS
1300.0000 mg | ORAL_TABLET | Freq: Three times a day (TID) | ORAL | Status: DC
  Administered 2017-02-08 – 2017-02-09 (×5): 1300 mg via ORAL
  Filled 2017-02-08 (×6): qty 2

## 2017-02-08 MED ORDER — CARVEDILOL 12.5 MG PO TABS
12.5000 mg | ORAL_TABLET | Freq: Two times a day (BID) | ORAL | Status: DC
  Administered 2017-02-08 – 2017-02-09 (×4): 12.5 mg via ORAL
  Filled 2017-02-08 (×4): qty 1

## 2017-02-08 MED ORDER — FOLIC ACID 1 MG PO TABS
1.0000 mg | ORAL_TABLET | Freq: Every day | ORAL | Status: DC
Start: 2017-02-08 — End: 2017-02-09
  Administered 2017-02-08 – 2017-02-09 (×2): 1 mg via ORAL
  Filled 2017-02-08 (×2): qty 1

## 2017-02-08 MED ORDER — PREDNISONE 5 MG PO TABS
5.0000 mg | ORAL_TABLET | Freq: Every day | ORAL | Status: DC
  Administered 2017-02-08 – 2017-02-09 (×2): 5 mg via ORAL
  Filled 2017-02-08 (×3): qty 1

## 2017-02-08 MED ORDER — CALCITRIOL 0.5 MCG PO CAPS
0.5000 ug | ORAL_CAPSULE | ORAL | Status: DC
  Administered 2017-02-09: 0.5 ug via ORAL
  Filled 2017-02-08: qty 1

## 2017-02-08 MED ORDER — PANTOPRAZOLE SODIUM 40 MG PO TBEC
40.0000 mg | DELAYED_RELEASE_TABLET | Freq: Every day | ORAL | Status: DC
  Administered 2017-02-08: 40 mg via ORAL
  Filled 2017-02-08 (×2): qty 1

## 2017-02-08 MED ORDER — ACETAMINOPHEN 325 MG PO TABS: 650.0000 mg | ORAL_TABLET | Freq: Four times a day (QID) | ORAL | Status: DC | PRN

## 2017-02-08 MED ORDER — ASPIRIN EC 81 MG PO TBEC
81.0000 mg | DELAYED_RELEASE_TABLET | Freq: Every day | ORAL | Status: DC
  Administered 2017-02-08 – 2017-02-09 (×2): 81 mg via ORAL
  Filled 2017-02-08 (×2): qty 1

## 2017-02-08 MED ORDER — INSULIN ASPART 100 UNIT/ML ~~LOC~~ SOLN
0.0000 [IU] | Freq: Three times a day (TID) | SUBCUTANEOUS | Status: DC
  Administered 2017-02-08: 3 [IU] via SUBCUTANEOUS
  Administered 2017-02-08 – 2017-02-09 (×2): 5 [IU] via SUBCUTANEOUS
  Administered 2017-02-09 (×2): 8 [IU] via SUBCUTANEOUS

## 2017-02-08 MED ORDER — CALCIUM CARBONATE 1250 (500 CA) MG PO TABS
1.0000 | ORAL_TABLET | Freq: Three times a day (TID) | ORAL | Status: DC
  Administered 2017-02-08 – 2017-02-09 (×6): 500 mg via ORAL
  Filled 2017-02-08 (×7): qty 1

## 2017-02-08 MED ORDER — MAGNESIUM OXIDE 400 (241.3 MG) MG PO TABS
400.0000 mg | ORAL_TABLET | Freq: Every day | ORAL | Status: DC
  Administered 2017-02-08 – 2017-02-09 (×2): 400 mg via ORAL
  Filled 2017-02-08 (×3): qty 1

## 2017-02-08 MED ORDER — INSULIN GLARGINE 100 UNIT/ML ~~LOC~~ SOLN
50.0000 [IU] | Freq: Every day | SUBCUTANEOUS | Status: DC
  Administered 2017-02-08 – 2017-02-09 (×2): 50 [IU] via SUBCUTANEOUS
  Filled 2017-02-08 (×2): qty 0.5

## 2017-02-08 MED ORDER — ENOXAPARIN SODIUM 30 MG/0.3ML ~~LOC~~ SOLN
30.0000 mg | Freq: Every day | SUBCUTANEOUS | Status: DC
  Administered 2017-02-08: 30 mg via SUBCUTANEOUS
  Filled 2017-02-08 (×3): qty 0.3

## 2017-02-08 NOTE — ED Notes (Signed)
Gave pt 2 packs of graham crackers.

## 2017-02-08 NOTE — H&P (Signed)
History and Physical    [name] PVV:[id number] DOB: 09/01/1970 DOA: 02/07/2017  PCP: Marton Redwood, MD  Patient coming from: Home  I have personally briefly reviewed patient's old medical records in [place]  Chief Complaint: Generalized weakness  HPI: [name] is a 46 y.o. male with medical history significant of Kidney transplant in 2015 for ESRD in setting of DM2, HTN, NICM.  Patient presents to the ED with c/o generalized weakness, nausea, anorexia.  Onset about 1 month ago, progressive symptoms over past month without clear cause.  He has been inconsistent with taking insulin and rarely checks his BGL.  Symptoms have worsened and he presents to ED.   ED Course: BGL 882.   Review of Systems: As per HPI otherwise 10 point review of systems negative.   Past Medical History:  Diagnosis Date  . Anemia   . CHF (congestive heart failure) ([place])   . Diabetes mellitus 12/2008   A1C 8.3 12/23/08, 24hr urine protein 2793 mg/day, SPEP neg, proliferative BDR s/p photocoagulation 08/2009 done at [facility] and f/u by DR Groat  . ESRD (end stage renal disease) on dialysis ([place])    1.Initiated HD via right per cate 12/30/2008 (MWF schedule at [facility]) 2.Left upper extremity A-V fistula by Dr Oneida Alar 12/30/2008 3.Aemia: Received Venofer 12/30/08 Ferritin 179 % sat 12 12/23/08, 4. Secondary Hyperparathyroidism- PTH 188, P 5.4, Ca 9.2 12/2008  . Hemodialysis-associated hypotension    coreg d/c'd  . History of blood transfusion   . Hypertension    resloved after initating HD, now with post HD hypotension  . Non-ischemic cardiomyopathy (HCC)    EF 20-25% 12/23/2008>>EF 40-45% 03/2009>>EF ??? 7/?/2011  . Psoriasis    scalp    Past Surgical History:  Procedure Laterality Date  . AV FISTULA PLACEMENT     left  . AV FISTULA PLACEMENT  09/12/2012   Procedure: INSERTION OF ARTERIOVENOUS (AV) GORE-TEX GRAFT ARM;  Surgeon: Elam Dutch, MD;  Location: [facility];  Service: Vascular;   Laterality: Left;  KIDNEY TRANSPLANT FAILED  . CATARACT EXTRACTION, BILATERAL    . EYE SURGERY    . INSERTION OF DIALYSIS CATHETER  07/14/2012   Procedure: INSERTION OF DIALYSIS CATHETER;  Surgeon: Mal Misty, MD;  Location: [facility];  Service: Vascular;  Laterality: Right;  Ultrasound Guided Insertion of Internal Jugular Dialysis Catheter  . KIDNEY TRANSPLANT  2013   did not work  . NEPHRECTOMY TRANSPLANTED ORGAN  08/11/12  . PHOTOCOAGULATION     for diabetic retinopathy 08/1999  . SHUNTOGRAM N/A 07/11/2012   Procedure: Earney Mallet;  Surgeon: Serafina Mitchell, MD;  Location: [facility] CATH LAB;  Service: Cardiovascular;  Laterality: N/A;     reports that he has never smoked. He has never used smokeless tobacco. He reports that he does not drink alcohol or use drugs.  Allergies  Allergen Reactions  . Lactose Intolerance (Gi) Diarrhea    No whole milk!!  . Other Other (See Comments)    Soy milk burns his tongue  . Pollen Extract Other (See Comments)    Itching, watery eyes and sneezing.  . Tape Rash    PLEASE USE COBAN WRAP OR PAPER TAPE     Family History  Problem Relation Age of Onset  . Hypertension Mother   . Diabetes Father   . Kidney disease Father   . Other Father        amputation  . Hypertension Brother   . Bleeding Disorder Brother   .  CAD Neg Hx   . Sudden Cardiac Death Neg Hx   . Congestive Heart Failure Neg Hx      Prior to Admission medications   Medication Sig Start Date End Date Taking? Authorizing Provider  amoxicillin (AMOXIL) 500 MG tablet Take 2,000 mg by mouth See admin instructions. ONE HOUR PRIOR TO DENTAL APPOINTMENT 03/09/16  Yes [provider]  aspirin EC 81 MG tablet Take 81 mg by mouth daily.   Yes [provider]  calcitRIOL (ROCALTROL) 0.5 MCG capsule Take 1 capsule by mouth every Monday, Wednesday, and Friday.  06/03/15  Yes [provider]  calcium carbonate (OS-CAL - DOSED IN MG OF ELEMENTAL CALCIUM) 1250 MG tablet  Take 1 tablet by mouth 3 (three) times daily with meals.   Yes [provider]  carvedilol (COREG) 12.5 MG tablet Take 12.5 mg by mouth 2 (two) times daily. 03/07/14  Yes [provider]  dapsone 25 MG tablet Take 50 mg by mouth daily.   Yes [provider]  folic acid (FOLVITE) 1 MG tablet Take 1 mg by mouth daily.  06/24/15  Yes [provider]  insulin aspart (NOVOLOG) 100 UNIT/ML FlexPen Inject 12 units with breakfast, 12 units with lunch, 12 units with supper Patient taking differently: Inject 0-20 Units into the skin 3 (three) times daily before meals. PER SLIDING SCALE 01/29/15  Yes Kelby Aline, MD  ketoconazole (NIZORAL) 2 % shampoo Apply topically 2 (two) times a week. Wash hair and beard.  Leave shampoo on skin for 5 mins before washing off. 04/24/14  Yes Kelby Aline, MD  magnesium oxide (MAG-OX) 400 MG tablet Take 400 mg by mouth daily.  07/01/15  Yes [provider]  mycophenolate (MYFORTIC) 180 MG EC tablet Take 720 mg by mouth 2 (two) times daily.   Yes [provider]  omeprazole (PRILOSEC) 20 MG capsule Take 20 mg by mouth daily.   Yes [provider]  predniSONE (DELTASONE) 5 MG tablet Take 5 mg by mouth daily with breakfast.   Yes [provider]  sodium bicarbonate 650 MG tablet Take 1,300 mg by mouth 3 (three) times daily.  06/24/15  Yes [provider]  tacrolimus (PROGRAF) 1 MG capsule Take 5 mg by mouth 2 (two) times daily.    Yes [provider]  TRESIBA FLEXTOUCH 200 UNIT/ML SOPN Inject 50 Units into the skin at bedtime.  07/01/15  Yes [provider]  Blood Glucose Monitoring Suppl (ACCU-CHEK NANO SMARTVIEW) W/DEVICE KIT Use as instructed 04/24/14   Kelby Aline, MD  glucose blood (FREESTYLE LITE) test strip Check blood sugar 4 times a day before meals and bed. diag code E11.9. Insulin dependent 12/25/14   Annia Belt, MD  Insulin Pen Needle (PEN NEEDLES) 31G X 8 MM  MISC Use 4 times daily to inject insulin into the skin. diag codeE11.9. Insulin dependent 01/29/15   Bartholomew Crews, MD  Lancets (FREESTYLE) lancets Check blood sugar 4 times a day before meals and bed. diag code E11.9. Insulin dependent 12/31/14   Sid Falcon, MD    Physical Exam: Vitals:   02/07/17 2300 02/07/17 2330 02/08/17 0000 02/08/17 0030  BP: 138/82 137/69 126/62 121/66  Pulse: 72 75 81 84  Resp: 20 14 20  (!) 22  Temp:      SpO2: 98% 97% 95% 96%  Weight:      Height:        Constitutional: NAD, calm, comfortable Eyes: PERRL, lids  and conjunctivae normal ENMT: Mucous membranes are moist. Posterior pharynx clear of any exudate or lesions.Normal dentition.  Neck: normal, supple, no masses, no thyromegaly Respiratory: clear to auscultation bilaterally, no wheezing, no crackles. Normal respiratory effort. No accessory muscle use.  Cardiovascular: Regular rate and rhythm, no murmurs / rubs / gallops. No extremity edema. 2+ pedal pulses. No carotid bruits.  Abdomen: no tenderness, no masses palpated. No hepatosplenomegaly. Bowel sounds positive.  Musculoskeletal: no clubbing / cyanosis. No joint deformity upper and lower extremities. Good ROM, no contractures. Normal muscle tone.  Skin: no rashes, lesions, ulcers. No induration Neurologic: CN 2-12 grossly intact. Sensation intact, DTR normal. Strength 5/5 in all 4.  Psychiatric: Normal judgment and insight. Alert and oriented x 3. Normal mood.    Labs on Admission: I have personally reviewed following labs and imaging studies  CBC:  Recent Labs Lab 02/07/17 1811  WBC 9.7  NEUTROABS 8.3*  HGB 9.0*  HCT 28.5*  MCV 84.8  PLT 696*   Basic Metabolic Panel:  Recent Labs Lab 02/07/17 1811  NA 122*  K 5.2*  CL 90*  CO2 20*  GLUCOSE 882*  BUN 36*  CREATININE 2.99*  CALCIUM 9.5   GFR: Estimated Creatinine Clearance: 24.4 mL/min (A) (by C-G formula based on SCr of 2.99 mg/dL (H)). Liver Function  Tests:  Recent Labs Lab 02/07/17 1811  AST 14*  ALT 16*  ALKPHOS 159*  BILITOT 0.6  PROT 6.3*  ALBUMIN 3.8   No results for input(s): LIPASE, AMYLASE in the last 168 hours. No results for input(s): AMMONIA in the last 168 hours. Coagulation Profile: No results for input(s): INR, PROTIME in the last 168 hours. Cardiac Enzymes: No results for input(s): CKTOTAL, CKMB, CKMBINDEX, TROPONINI in the last 168 hours. BNP (last 3 results) No results for input(s): PROBNP in the last 8760 hours. HbA1C: No results for input(s): HGBA1C in the last 72 hours. CBG:  Recent Labs Lab 02/07/17 2302 02/08/17 0033  GLUCAP >600* >600*   Lipid Profile: No results for input(s): CHOL, HDL, LDLCALC, TRIG, CHOLHDL, LDLDIRECT in the last 72 hours. Thyroid Function Tests: No results for input(s): TSH, T4TOTAL, FREET4, T3FREE, THYROIDAB in the last 72 hours. Anemia Panel: No results for input(s): VITAMINB12, FOLATE, FERRITIN, TIBC, IRON, RETICCTPCT in the last 72 hours. Urine analysis:    Component Value Date/Time   COLORURINE YELLOW 12/23/2008 1845   APPEARANCEUR CLEAR 12/23/2008 1845   LABSPEC 1.015 12/23/2008 1845   PHURINE 5.0 12/23/2008 1845   GLUCOSEU NEGATIVE 12/23/2008 1845   HGBUR SMALL (A) 12/23/2008 1845   BILIRUBINUR NEGATIVE 12/23/2008 1845   KETONESUR NEGATIVE 12/23/2008 1845   PROTEINUR >300 (A) 12/23/2008 1845   UROBILINOGEN 0.2 12/23/2008 1845   NITRITE NEGATIVE 12/23/2008 1845   LEUKOCYTESUR NEGATIVE 12/23/2008 1845    Radiological Exams on Admission: No results found.  EKG: Independently reviewed.  Assessment/Plan Principal Problem:   Hyperglycemia without ketosis Active Problems:   DM2 (diabetes mellitus, type 2) (HCC)   Essential hypertension, benign   Deceased-donor kidney transplant    1. Hyperglycemia without ketosis in setting of DM2 -  1. glucostabilizer 2. Repeat BMP in AM 3. IVF 2. Kidney transplant - 1. continue anti-rejection meds 2. Creat of  2.9 today appears c/w his baseline which typically runs 2.5 or so from [facility] labs last year 3. HTN - continue home meds  DVT prophylaxis: Lovenox Code Status: Full Family Communication: Family at bedside Disposition Plan: Home after admit Consults called: None Admission status: Place in  obs   Etta Quill DO [facility] Pager [phone]  If 7AM-7PM, please contact day team taking care of patient [email or web address] Password [facility]  02/08/2017, 12:45 AM

## 2017-02-08 NOTE — Progress Notes (Signed)
Floor declined patient due to glucostabilizer, ordering SDU.

## 2017-02-08 NOTE — Progress Notes (Signed)
Consulted to confirm Prograf dosing and to order PTA dose by Dr. Allyson Sabal.  Patient's home medications were reported to medication history technician yesterday, and were ordered on admission.  I double checked with the patient and confirmed his transplant medications are Mycophenolate 720mg  BID Prednisone 5mg  daily Tacrolimus 5mg  BID  No dose changes required as all these medications have been ordered and given today.   [name] D. [name], PharmD, BCPS Clinical Pharmacist 02/08/2017 6:19 PM

## 2017-02-08 NOTE — ED Notes (Signed)
4th NS 1L bolus started.

## 2017-02-08 NOTE — Progress Notes (Signed)
   Patient seen and examined  46 y.o. male with medical history significant of Kidney transplant in 2015 for ESRD in setting of DM2, HTN, NICM.  Patient presents to the ED with c/o generalized weakness, nausea, anorexia, admitted for hyperosmolar hyperglycemic state, on glucose stabilizer. Anion gap within normal limits. Bicarbonate low likely secondary to chronic kidney disease. Patient transitioned to Lantus and sliding scale insulin

## 2017-02-09 DIAGNOSIS — E1100 Type 2 diabetes mellitus with hyperosmolarity without nonketotic hyperglycemic-hyperosmolar coma (NKHHC): Secondary | ICD-10-CM | POA: Diagnosis not present

## 2017-02-09 DIAGNOSIS — I10 Essential (primary) hypertension: Secondary | ICD-10-CM | POA: Diagnosis not present

## 2017-02-09 DIAGNOSIS — E1101 Type 2 diabetes mellitus with hyperosmolarity with coma: Secondary | ICD-10-CM

## 2017-02-09 DIAGNOSIS — R739 Hyperglycemia, unspecified: Secondary | ICD-10-CM | POA: Diagnosis not present

## 2017-02-09 LAB — COMPREHENSIVE METABOLIC PANEL
ALT: 15 U/L — AB (ref 17–63)
ANION GAP: 8 (ref 5–15)
AST: 18 U/L (ref 15–41)
Albumin: 3 g/dL — ABNORMAL LOW (ref 3.5–5.0)
Alkaline Phosphatase: 112 U/L (ref 38–126)
BUN: 21 mg/dL — ABNORMAL HIGH (ref 6–20)
CHLORIDE: 109 mmol/L (ref 101–111)
CO2: 18 mmol/L — AB (ref 22–32)
CREATININE: 2.19 mg/dL — AB (ref 0.61–1.24)
Calcium: 8.3 mg/dL — ABNORMAL LOW (ref 8.9–10.3)
GFR calc non Af Amer: 34 mL/min — ABNORMAL LOW (ref >60)
GFR, EST AFRICAN AMERICAN: 39 mL/min — AB (ref >60)
Glucose, Bld: 268 mg/dL — ABNORMAL HIGH (ref 65–99)
POTASSIUM: 3.3 mmol/L — AB (ref 3.5–5.1)
SODIUM: 135 mmol/L (ref 135–145)
Total Bilirubin: 0.4 mg/dL (ref 0.3–1.2)
Total Protein: 5.5 g/dL — ABNORMAL LOW (ref 6.5–8.1)

## 2017-02-09 LAB — IRON AND TIBC
Iron: 65 ug/dL (ref 45–182)
Saturation Ratios: 27 % (ref 17.9–39.5)
TIBC: 245 ug/dL — ABNORMAL LOW (ref 250–450)
UIBC: 180 ug/dL

## 2017-02-09 LAB — GLUCOSE, CAPILLARY
GLUCOSE-CAPILLARY: 283 mg/dL — AB (ref 65–99)
Glucose-Capillary: 285 mg/dL — ABNORMAL HIGH (ref 65–99)
Glucose-Capillary: 249 mg/dL — ABNORMAL HIGH (ref 65–99)

## 2017-02-09 LAB — CBC
HEMATOCRIT: 27 % — AB (ref 39.0–52.0)
HEMOGLOBIN: 8.6 g/dL — AB (ref 13.0–17.0)
MCH: 26.6 pg (ref 26.0–34.0)
MCHC: 31.9 g/dL (ref 30.0–36.0)
MCV: 83.6 fL (ref 78.0–100.0)
Platelets: 122 10*3/uL — ABNORMAL LOW (ref 150–400)
RBC: 3.23 MIL/uL — AB (ref 4.22–5.81)
RDW: 14.1 % (ref 11.5–15.5)
WBC: 8.4 10*3/uL (ref 4.0–10.5)

## 2017-02-09 LAB — TACROLIMUS LEVEL
Tacrolimus (FK506) - LabCorp: 14.2 ng/mL (ref 2.0–20.0)
Tacrolimus (FK506) - LabCorp: 8.3 ng/mL (ref 2.0–20.0)

## 2017-02-09 LAB — HEMOGLOBIN A1C
Hgb A1c MFr Bld: 15 % — ABNORMAL HIGH (ref 4.8–5.6)
MEAN PLASMA GLUCOSE: 384 mg/dL

## 2017-02-09 MED ORDER — INSULIN GLARGINE 100 UNIT/ML ~~LOC~~ SOLN: 65.0000 [IU] | Freq: Every day | SUBCUTANEOUS | Status: DC

## 2017-02-09 MED ORDER — POTASSIUM CHLORIDE CRYS ER 20 MEQ PO TBCR
40.0000 meq | EXTENDED_RELEASE_TABLET | Freq: Once | ORAL | Status: AC
  Administered 2017-02-09: 40 meq via ORAL
  Filled 2017-02-09: qty 4

## 2017-02-09 MED ORDER — SODIUM CHLORIDE 0.9 % IV SOLN
125.0000 mg | Freq: Once | INTRAVENOUS | Status: AC
  Administered 2017-02-09: 125 mg via INTRAVENOUS
  Filled 2017-02-09: qty 10

## 2017-02-09 MED ORDER — INSULIN GLARGINE 100 UNIT/ML ~~LOC~~ SOLN
15.0000 [IU] | Freq: Once | SUBCUTANEOUS | Status: AC
  Administered 2017-02-09: 15 [IU] via SUBCUTANEOUS
  Filled 2017-02-09: qty 0.15

## 2017-02-09 MED ORDER — TRESIBA FLEXTOUCH 200 UNIT/ML ~~LOC~~ SOPN: 65.0000 [IU] | PEN_INJECTOR | Freq: Every day | SUBCUTANEOUS | 11 refills | Status: DC

## 2017-02-09 NOTE — Progress Notes (Signed)
Inpatient Diabetes Program Recommendations  AACE/ADA: New Consensus Statement on Inpatient Glycemic Control (2015)  Target Ranges:  Prepandial:   less than 140 mg/dL      Peak postprandial:   less than 180 mg/dL (1-2 hours)      Critically ill patients:  140 - 180 mg/dL   Lab Results  Component Value Date   GLUCAP 285 (H) 02/09/2017   HGBA1C 15.0 (H) 02/08/2017    Discussed A1C results with patient.  He states that he see's Dr. Brigitte Pulse and [facility] and [place] [facility]-CDE/Pharmacist also.  He missed his April appointment due to car problems.  He and wife state that blood sugars are often elevated at home and that anti-rejection meds seem to make them worse.  He states he does not miss insulin injections and monitors 3 times a day.  Briefly discussed insulin administration sites.  He administers insulin in arms and legs.  Encouraged him to use abdomen as well due to better absorption.  He has appointment with Oren Section next week on July 3.  Note that [place] increased as well.  Encouraged close f/u with MD and explained that it is especially important for him to control blood sugars with kidney transplant.  Thanks, Adah Perl, RN, BC-ADM Inpatient Diabetes Coordinator Pager [phone] (8a-5p)

## 2017-02-09 NOTE — Care Management Obs Status (Signed)
[place] NOTIFICATION   Patient Details  Name: [name] MRN: [id number] Date of Birth: June 20, 1971   Medicare Observation Status Notification Given:  Yes    Bethena Roys, RN 02/09/2017, 2:05 PM

## 2017-02-09 NOTE — Discharge Summary (Addendum)
Physician Discharge Summary  [name] MRN: [id number] DOB/AGE: Aug 08, 1971 46 y.o.  PCP: Marton Redwood, MD   Admit date: 02/07/2017 Discharge date: 02/09/2017  Discharge Diagnoses:    Principal Problem:   Hyperglycemia without ketosis Active Problems:   DM2 (diabetes mellitus, type 2) (HCC)   Essential hypertension, benign   Deceased-donor kidney transplant   Type 2 diabetes mellitus with hyperosmolar nonketotic hyperglycemia (HCC)   Uncontrolled type 2 diabetes mellitus with hyperosmolar nonketotic hyperglycemia (HCC)    Follow-up recommendations Follow-up with PCP in 3-5 days , including all  additional recommended appointments as below Follow-up CBC, CMP in 3-5 days       Current Discharge Medication List    CONTINUE these medications which have CHANGED   Details  TRESIBA FLEXTOUCH 200 UNIT/ML SOPN Inject 66 Units into the skin at bedtime. Qty: 1 pen, Refills: 11      CONTINUE these medications which have NOT CHANGED   Details  amoxicillin (AMOXIL) 500 MG tablet Take 2,000 mg by mouth See admin instructions. ONE HOUR PRIOR TO DENTAL APPOINTMENT    aspirin EC 81 MG tablet Take 81 mg by mouth daily.    calcitRIOL (ROCALTROL) 0.5 MCG capsule Take 1 capsule by mouth every Monday, Wednesday, and Friday.  Refills: 3    calcium carbonate (OS-CAL - DOSED IN MG OF ELEMENTAL CALCIUM) 1250 MG tablet Take 1 tablet by mouth 3 (three) times daily with meals.    carvedilol (COREG) 12.5 MG tablet Take 12.5 mg by mouth 2 (two) times daily.    dapsone 25 MG tablet Take 50 mg by mouth daily.    folic acid (FOLVITE) 1 MG tablet Take 1 mg by mouth daily.  Refills: 6    insulin aspart (NOVOLOG) 100 UNIT/ML FlexPen Inject 12 units with breakfast, 12 units with lunch, 12 units with supper Qty: 45 mL, Refills: 3    ketoconazole (NIZORAL) 2 % shampoo Apply topically 2 (two) times a week. Wash hair and beard.  Leave shampoo on skin for 5 mins before washing off. Qty: 120 mL,  Refills: 1    magnesium oxide (MAG-OX) 400 MG tablet Take 400 mg by mouth daily.  Refills: 6    mycophenolate (MYFORTIC) 180 MG EC tablet Take 720 mg by mouth 2 (two) times daily.    omeprazole (PRILOSEC) 20 MG capsule Take 20 mg by mouth daily.    predniSONE (DELTASONE) 5 MG tablet Take 5 mg by mouth daily with breakfast.    sodium bicarbonate 650 MG tablet Take 1,300 mg by mouth 3 (three) times daily.  Refills: 6    tacrolimus (PROGRAF) 1 MG capsule Take 5 mg by mouth 2 (two) times daily.     Blood Glucose Monitoring Suppl (ACCU-CHEK NANO SMARTVIEW) W/DEVICE KIT Use as instructed Qty: 1 kit, Refills: 0   Associated Diagnoses: Type II or unspecified type diabetes mellitus with unspecified complication, uncontrolled    glucose blood (FREESTYLE LITE) test strip Check blood sugar 4 times a day before meals and bed. diag code E11.9. Insulin dependent Qty: 125 each, Refills: 2   Associated Diagnoses: Type 2 diabetes mellitus with diabetic chronic kidney disease (HCC)    Insulin Pen Needle (PEN NEEDLES) 31G X 8 MM MISC Use 4 times daily to inject insulin into the skin. diag codeE11.9. Insulin dependent Qty: 130 each, Refills: 6    Lancets (FREESTYLE) lancets Check blood sugar 4 times a day before meals and bed. diag code E11.9. Insulin dependent Qty: 200 each, Refills: 5  Associated Diagnoses: Type 2 diabetes mellitus with diabetic chronic kidney disease [facility])         Discharge Condition:  Stable  Discharge Instructions Get Medicines reviewed and adjusted: Please take all your medications with you for your next visit with your Primary MD  Please request your Primary MD to go over all hospital tests and procedure/radiological results at the follow up, please ask your Primary MD to get all Hospital records sent to his/her office.  If you experience worsening of your admission symptoms, develop shortness of breath, life threatening emergency, suicidal or homicidal thoughts you  must seek medical attention immediately by calling 911 or calling your MD immediately if symptoms less severe.  You must read complete instructions/literature along with all the possible adverse reactions/side effects for all the Medicines you take and that have been prescribed to you. Take any new Medicines after you have completely understood and accpet all the possible adverse reactions/side effects.   Do not drive when taking Pain medications.   Do not take more than prescribed Pain, Sleep and Anxiety Medications  Special Instructions: If you have smoked or chewed Tobacco in the last 2 yrs please stop smoking, stop any regular Alcohol and or any Recreational drug use.  Wear Seat belts while driving.  Please note  You were cared for by a hospitalist during your hospital stay. Once you are discharged, your primary care physician will handle any further medical issues. Please note that NO REFILLS for any discharge medications will be authorized once you are discharged, as it is imperative that you return to your primary care physician (or establish a relationship with a primary care physician if you do not have one) for your aftercare needs so that they can reassess your need for medications and monitor your lab values.     Allergies  Allergen Reactions  . Lactose Intolerance (Gi) Diarrhea    No whole milk!!  . Other Other (See Comments)    Soy milk burns his tongue  . Pollen Extract Other (See Comments)    Itching, watery eyes and sneezing.  . Tape Rash    PLEASE USE COBAN WRAP OR PAPER TAPE       Disposition: 01-Home or Self Care   Consults:  None    Significant Diagnostic Studies:  Dg Chest 2 View  Result Date: 02/08/2017 CLINICAL DATA:  Uncontrolled type 2 diabetes.  Cough and fever. EXAM: CHEST  2 VIEW COMPARISON:  07/14/2012 FINDINGS: Heart size is mildly enlarged but stable. Two small nodular densities in the left lower chest. Round nodular density probably  represents a nipple shadow. There is a questionable nodule along the left cardiac border. No evidence for airspace disease or pulmonary edema. No large pleural effusions. No acute bone abnormality. IMPRESSION: Subtle nodular densities in the left lower chest. At least one nodule probably represents a nipple shadow. The other nodular area is nonspecific and could represent overlying shadows. Consider short-term follow-up with nipple markers for further characterization. Electronically Signed   By: Markus Daft M.D.   On: 02/08/2017 08:48        Filed Weights   02/07/17 1727 02/09/17 0500  Weight: 57.2 kg (126 lb 1 oz) 61 kg (134 lb 6.4 oz)     Microbiology: No results found for this or any previous visit (from the past 240 hour(s)).     Blood Culture    Component Value Date/Time   SDES URINE, CLEAN CATCH 12/23/2008 1845   SPECREQUEST NONE 12/23/2008 1845  CULT NO GROWTH 12/23/2008 1845   REPTSTATUS 12/25/2008 FINAL 12/23/2008 1845      Labs: Results for orders placed or performed during the hospital encounter of 02/07/17 (from the past 48 hour(s))  CBC with Differential     Status: Abnormal   Collection Time: 02/07/17  6:11 PM  Result Value Ref Range   WBC 9.7 4.0 - 10.5 K/uL   RBC 3.36 (L) 4.22 - 5.81 MIL/uL   Hemoglobin 9.0 (L) 13.0 - 17.0 g/dL   HCT 28.5 (L) 39.0 - 52.0 %   MCV 84.8 78.0 - 100.0 fL   MCH 26.8 26.0 - 34.0 pg   MCHC 31.6 30.0 - 36.0 g/dL   RDW 14.5 11.5 - 15.5 %   Platelets 142 (L) 150 - 400 K/uL   Neutrophils Relative % 86 %   Lymphocytes Relative 6 %   Monocytes Relative 7 %   Eosinophils Relative 1 %   Basophils Relative 0 %   Neutro Abs 8.3 (H) 1.7 - 7.7 K/uL   Lymphs Abs 0.6 (L) 0.7 - 4.0 K/uL   Monocytes Absolute 0.7 0.1 - 1.0 K/uL   Eosinophils Absolute 0.1 0.0 - 0.7 K/uL   Basophils Absolute 0.0 0.0 - 0.1 K/uL   WBC Morphology INCREASED BANDS (>20% BANDS)     Comment: MILD LEFT SHIFT (1-5% METAS, OCC MYELO, OCC BANDS)  Comprehensive  metabolic panel     Status: Abnormal   Collection Time: 02/07/17  6:11 PM  Result Value Ref Range   Sodium 122 (L) 135 - 145 mmol/L   Potassium 5.2 (H) 3.5 - 5.1 mmol/L   Chloride 90 (L) 101 - 111 mmol/L   CO2 20 (L) 22 - 32 mmol/L   Glucose, Bld 882 (HH) 65 - 99 mg/dL    Comment: CRITICAL RESULT CALLED TO, READ BACK BY AND VERIFIED WITH: A. [facility] RN [phone] GREEN R    BUN 36 (H) 6 - 20 mg/dL   Creatinine, Ser 2.99 (H) 0.61 - 1.24 mg/dL   Calcium 9.5 8.9 - 10.3 mg/dL   Total Protein 6.3 (L) 6.5 - 8.1 g/dL   Albumin 3.8 3.5 - 5.0 g/dL   AST 14 (L) 15 - 41 U/L   ALT 16 (L) 17 - 63 U/L   Alkaline Phosphatase 159 (H) 38 - 126 U/L   Total Bilirubin 0.6 0.3 - 1.2 mg/dL   GFR calc non Af Amer 23 (L) >60 mL/min   GFR calc Af Amer 27 (L) >60 mL/min    Comment: (NOTE) The eGFR has been calculated using the CKD EPI equation. This calculation has not been validated in all clinical situations. eGFR's persistently <60 mL/min signify possible Chronic Kidney Disease.    Anion gap 12 5 - 15  I-Stat CG4 Lactic Acid, ED     Status: None   Collection Time: 02/07/17  8:18 PM  Result Value Ref Range   Lactic Acid, Venous 1.80 0.5 - 1.9 mmol/L  CBG monitoring, ED     Status: Abnormal   Collection Time: 02/07/17 11:02 PM  Result Value Ref Range   Glucose-Capillary >600 (HH) 65 - 99 mg/dL  CBG monitoring, ED     Status: Abnormal   Collection Time: 02/08/17 12:33 AM  Result Value Ref Range   Glucose-Capillary >600 (HH) 65 - 99 mg/dL  HIV antibody (Routine Testing)     Status: None   Collection Time: 02/08/17 12:57 AM  Result Value Ref Range   HIV Screen 4th Generation wRfx Non  Reactive Non Reactive    Comment: (NOTE) Performed At: [facility] [place], [place] [id number] Lindon Romp MD [id number]   CBG monitoring, ED     Status: Abnormal   Collection Time: 02/08/17  1:47 AM  Result Value Ref Range   Glucose-Capillary 554 (HH) 65 - 99 mg/dL   Comment 1  Notify RN   CBG monitoring, ED     Status: Abnormal   Collection Time: 02/08/17  2:53 AM  Result Value Ref Range   Glucose-Capillary 454 (H) 65 - 99 mg/dL  CBG monitoring, ED     Status: Abnormal   Collection Time: 02/08/17  4:07 AM  Result Value Ref Range   Glucose-Capillary 325 (H) 65 - 99 mg/dL  Basic metabolic panel     Status: Abnormal   Collection Time: 02/08/17  4:30 AM  Result Value Ref Range   Sodium 135 135 - 145 mmol/L   Potassium 3.3 (L) 3.5 - 5.1 mmol/L   Chloride 110 101 - 111 mmol/L   CO2 19 (L) 22 - 32 mmol/L   Glucose, Bld 346 (H) 65 - 99 mg/dL   BUN 33 (H) 6 - 20 mg/dL   Creatinine, Ser 2.44 (H) 0.61 - 1.24 mg/dL   Calcium 8.0 (L) 8.9 - 10.3 mg/dL   GFR calc non Af Amer 30 (L) >60 mL/min   GFR calc Af Amer 34 (L) >60 mL/min    Comment: (NOTE) The eGFR has been calculated using the CKD EPI equation. This calculation has not been validated in all clinical situations. eGFR's persistently <60 mL/min signify possible Chronic Kidney Disease.    Anion gap 6 5 - 15  CBG monitoring, ED     Status: Abnormal   Collection Time: 02/08/17  5:29 AM  Result Value Ref Range   Glucose-Capillary 269 (H) 65 - 99 mg/dL  CBG monitoring, ED     Status: Abnormal   Collection Time: 02/08/17  6:41 AM  Result Value Ref Range   Glucose-Capillary 198 (H) 65 - 99 mg/dL  CBG monitoring, ED     Status: Abnormal   Collection Time: 02/08/17  7:44 AM  Result Value Ref Range   Glucose-Capillary 178 (H) 65 - 99 mg/dL  Comprehensive metabolic panel     Status: Abnormal   Collection Time: 02/08/17  8:03 AM  Result Value Ref Range   Sodium 135 135 - 145 mmol/L   Potassium 3.4 (L) 3.5 - 5.1 mmol/L   Chloride 110 101 - 111 mmol/L   CO2 18 (L) 22 - 32 mmol/L   Glucose, Bld 208 (H) 65 - 99 mg/dL   BUN 29 (H) 6 - 20 mg/dL   Creatinine, Ser 2.33 (H) 0.61 - 1.24 mg/dL   Calcium 8.3 (L) 8.9 - 10.3 mg/dL   Total Protein 5.0 (L) 6.5 - 8.1 g/dL   Albumin 2.9 (L) 3.5 - 5.0 g/dL   AST 16 15 - 41  U/L   ALT 12 (L) 17 - 63 U/L   Alkaline Phosphatase 112 38 - 126 U/L   Total Bilirubin 0.4 0.3 - 1.2 mg/dL   GFR calc non Af Amer 31 (L) >60 mL/min   GFR calc Af Amer 36 (L) >60 mL/min    Comment: (NOTE) The eGFR has been calculated using the CKD EPI equation. This calculation has not been validated in all clinical situations. eGFR's persistently <60 mL/min signify possible Chronic Kidney Disease.    Anion gap 7 5 - 15  Hemoglobin  A1c     Status: Abnormal   Collection Time: 02/08/17  8:03 AM  Result Value Ref Range   Hgb A1c MFr Bld 15.0 (H) 4.8 - 5.6 %    Comment: (NOTE)         Pre-diabetes: 5.7 - 6.4         Diabetes: >6.4         Glycemic control for adults with diabetes: <7.0    Mean Plasma Glucose 384 mg/dL    Comment: (NOTE) Performed At: [facility] [place], [place] [id number] Lindon Romp MD TZ:[id number]   Glucose, capillary     Status: Abnormal   Collection Time: 02/08/17  9:22 AM  Result Value Ref Range   Glucose-Capillary 127 (H) 65 - 99 mg/dL  Glucose, capillary     Status: Abnormal   Collection Time: 02/08/17 10:55 AM  Result Value Ref Range   Glucose-Capillary 129 (H) 65 - 99 mg/dL  Glucose, capillary     Status: Abnormal   Collection Time: 02/08/17 11:58 AM  Result Value Ref Range   Glucose-Capillary 177 (H) 65 - 99 mg/dL  Glucose, capillary     Status: Abnormal   Collection Time: 02/08/17  4:02 PM  Result Value Ref Range   Glucose-Capillary 246 (H) 65 - 99 mg/dL  Glucose, capillary     Status: Abnormal   Collection Time: 02/08/17  8:25 PM  Result Value Ref Range   Glucose-Capillary 258 (H) 65 - 99 mg/dL  Comprehensive metabolic panel     Status: Abnormal   Collection Time: 02/09/17  6:16 AM  Result Value Ref Range   Sodium 135 135 - 145 mmol/L   Potassium 3.3 (L) 3.5 - 5.1 mmol/L   Chloride 109 101 - 111 mmol/L   CO2 18 (L) 22 - 32 mmol/L   Glucose, Bld 268 (H) 65 - 99 mg/dL   BUN 21 (H) 6 - 20 mg/dL   Creatinine,  Ser 2.19 (H) 0.61 - 1.24 mg/dL   Calcium 8.3 (L) 8.9 - 10.3 mg/dL   Total Protein 5.5 (L) 6.5 - 8.1 g/dL   Albumin 3.0 (L) 3.5 - 5.0 g/dL   AST 18 15 - 41 U/L   ALT 15 (L) 17 - 63 U/L   Alkaline Phosphatase 112 38 - 126 U/L   Total Bilirubin 0.4 0.3 - 1.2 mg/dL   GFR calc non Af Amer 34 (L) >60 mL/min   GFR calc Af Amer 39 (L) >60 mL/min    Comment: (NOTE) The eGFR has been calculated using the CKD EPI equation. This calculation has not been validated in all clinical situations. eGFR's persistently <60 mL/min signify possible Chronic Kidney Disease.    Anion gap 8 5 - 15  CBC     Status: Abnormal   Collection Time: 02/09/17  6:16 AM  Result Value Ref Range   WBC 8.4 4.0 - 10.5 K/uL   RBC 3.23 (L) 4.22 - 5.81 MIL/uL   Hemoglobin 8.6 (L) 13.0 - 17.0 g/dL   HCT 27.0 (L) 39.0 - 52.0 %   MCV 83.6 78.0 - 100.0 fL   MCH 26.6 26.0 - 34.0 pg   MCHC 31.9 30.0 - 36.0 g/dL   RDW 14.1 11.5 - 15.5 %   Platelets 122 (L) 150 - 400 K/uL  Glucose, capillary     Status: Abnormal   Collection Time: 02/09/17  7:27 AM  Result Value Ref Range   Glucose-Capillary 283 (H) 65 - 99 mg/dL  Comment 1 QC Due    Comment 2 Notify RN   Glucose, capillary     Status: Abnormal   Collection Time: 02/09/17 11:27 AM  Result Value Ref Range   Glucose-Capillary 285 (H) 65 - 99 mg/dL     Lipid Panel     Component Value Date/Time   CHOL 206 (H) 08/23/2013 1420   TRIG 457 (H) 08/23/2013 1420   HDL 30 (L) 08/23/2013 1420   CHOLHDL 6.9 08/23/2013 1420   VLDL NOT CALC 08/23/2013 1420   LDLCALC  08/23/2013 1420     Comment:       Not calculated due to Triglyceride >400. Suggest ordering Direct LDL (Unit Code: [id number]).   Total Cholesterol/HDL Ratio:CHD Risk                        Coronary Heart Disease Risk Table                                        Men       Women          1/2 Average Risk              3.4        3.3              Average Risk              5.0        4.4           2X Average Risk               9.6        7.1           3X Average Risk             23.4       11.0 Use the calculated Patient Ratio above and the CHD Risk table  to determine the patient's CHD Risk. ATP III Classification (LDL):       < 100        mg/dL         Optimal      100 - 129     mg/dL         Near or Above Optimal      130 - 159     mg/dL         Borderline High      160 - 189     mg/dL         High       > 190        mg/dL         Very High     LDLDIRECT 111 (H) 08/23/2013 1420     Lab Results  Component Value Date   HGBA1C 15.0 (H) 02/08/2017   HGBA1C 7.7 01/16/2015   HGBA1C 9.5 11/01/2014     Lab Results  Component Value Date   [facility]  08/23/2013     Comment:       Not calculated due to Triglyceride >400. Suggest ordering Direct LDL (Unit Code: [phone]).   Total Cholesterol/HDL Ratio:CHD Risk                        Coronary Heart Disease Risk Table  Men       Women          1/2 Average Risk              3.4        3.3              Average Risk              5.0        4.4           2X Average Risk              9.6        7.1           3X Average Risk             23.4       11.0 Use the calculated Patient Ratio above and the CHD Risk table  to determine the patient's CHD Risk. ATP III Classification (LDL):       < 100        mg/dL         Optimal      100 - 129     mg/dL         Near or Above Optimal      130 - 159     mg/dL         Borderline High      160 - 189     mg/dL         High       > 190        mg/dL         Very High     CREATININE 2.19 (H) 02/09/2017     HPI :  46 y.o.malewith medical history significant of Kidney transplant in 2015 for ESRD in setting of DM2, HTN, NICM. Patient presents to the ED with c/o generalized weakness, nausea, anorexia, admitted for hyperosmolar hyperglycemic state, on glucose stabilizer  HOSPITAL COURSE:   Hyperosmolar hyperglycemic state Patient started on glucose stabilizer, due to CBG at  882 He endorses compliance with his home medications Tapered off glucose stabilizer, home dose of TRESIBA increased to 65 units at bedtime Continue NovoLog  Acute kidney injury/CK D stage IV Presented with creatinine of 2.99, baseline around 2 Status post renal transplant, Continue transplant medications-mycophenolate, prednisone, tacrolimus Followed by Dr. Mercy Moore Prograf level pending at the time of discharge Creatinine back to baseline Continue sodium bicarbonate tablets  Nonischemic cardiomyopathy Without exacerbation    Discharge Exam:   Blood pressure 126/78, pulse 89, temperature 98.4 F (36.9 C), temperature source Oral, resp. rate 18, height 5' 5"  (1.651 m), weight 61 kg (134 lb 6.4 oz), SpO2 100 %.   Cardiovascular: Regular rate and rhythm, no murmurs / rubs / gallops. No extremity edema. 2+ pedal pulses. No carotid bruits.  Abdomen: no tenderness, no masses palpated. No hepatosplenomegaly. Bowel sounds positive.  Musculoskeletal: no clubbing / cyanosis. No joint deformity upper and lower extremities. Good ROM, no contractures. Normal muscle tone.  Skin: no rashes, lesions, ulcers. No induration Neurologic: CN 2-12 grossly intact. Sensation intact, DTR normal. Strength 5/5 in all 4.  Psychiatric: Normal judgment and insight. Alert and oriented x 3. Normal mood.    Follow-up Information    Marton Redwood, MD. Call.   Specialty:  Internal Medicine Why:  Hospital follow-up in 3-5 days Contact information: [address] [place] [address] [phone]  SignedReyne Dumas 02/09/2017, 12:11 PM        Time spent >1 hour

## 2017-02-18 DIAGNOSIS — D631 Anemia in chronic kidney disease: Secondary | ICD-10-CM | POA: Diagnosis not present

## 2017-02-18 DIAGNOSIS — Z940 Kidney transplant status: Secondary | ICD-10-CM | POA: Diagnosis not present

## 2017-03-02 DIAGNOSIS — Z940 Kidney transplant status: Secondary | ICD-10-CM | POA: Diagnosis not present

## 2017-03-17 ENCOUNTER — Ambulatory Visit: Payer: Medicare Other | Admitting: Gastroenterology

## 2017-03-18 ENCOUNTER — Ambulatory Visit (INDEPENDENT_AMBULATORY_CARE_PROVIDER_SITE_OTHER): Payer: Medicare Other | Admitting: Gastroenterology

## 2017-03-18 ENCOUNTER — Encounter: Payer: Self-pay | Admitting: Gastroenterology

## 2017-03-18 ENCOUNTER — Encounter (INDEPENDENT_AMBULATORY_CARE_PROVIDER_SITE_OTHER): Payer: Self-pay

## 2017-03-18 VITALS — BP 112/64 | HR 72 | Ht 65.0 in | Wt 146.0 lb

## 2017-03-18 DIAGNOSIS — D649 Anemia, unspecified: Secondary | ICD-10-CM | POA: Insufficient documentation

## 2017-03-18 DIAGNOSIS — R195 Other fecal abnormalities: Secondary | ICD-10-CM | POA: Diagnosis not present

## 2017-03-18 MED ORDER — NA SULFATE-K SULFATE-MG SULF 17.5-3.13-1.6 GM/177ML PO SOLN: 1.0000 | ORAL | 0 refills | Status: DC

## 2017-03-18 NOTE — Progress Notes (Signed)
03/18/2017 [name] [id number] [birth year]   HISTORY OF PRESENT ILLNESS:  This is a very pleasant 46 year old male who is new to our office and was referred here by his nephrologist, Dr. Mercy Moore, for evaluation regarding Hemoccult positive stools.  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 Had a second renal transplant in 2015 that is also failing and he is again and stage III chronic kidney disease. Has chronic anemia. Most recent hemoglobin is 8.6 g. This is normocytic with normal MCV and iron studies.  He tells me that he is receiving erythropoietin injections.  Has never seen a GI doctor in the past. Denies any GI complaints including black or bloody stools. He was Hemoccult positive on stool cards studies, which is why he was referred here.  He does admit to more weakness and fatigue recently.   Past Medical History:  Diagnosis Date  . Anemia   . CHF (congestive heart failure) ([place])   . Diabetes mellitus 12/2008   A1C 8.3 12/23/08, 24hr urine protein 2793 mg/day, SPEP neg, proliferative BDR s/p photocoagulation 08/2009 done at [facility] and f/u by DR Groat  . ESRD (end stage renal disease) on dialysis ([place])    1.Initiated HD via right per cate 12/30/2008 (MWF schedule at [facility]) 2.Left upper extremity A-V fistula by Dr Oneida Alar 12/30/2008 3.Aemia: Received Venofer 12/30/08 Ferritin 179 % sat 12 12/23/08, 4. Secondary Hyperparathyroidism- PTH 188, P 5.4, Ca 9.2 12/2008  . Hemodialysis-associated hypotension    coreg d/c'd  . History of blood transfusion   . Hypertension    resloved after initating HD, now with post HD hypotension  . Kidney transplant as cause of abnormal reaction or later complication 7253   [facility]  . Non-ischemic cardiomyopathy ([place])    EF 20-25% 12/23/2008>>EF 40-45% 03/2009>>EF ??? 7/?/2011  . Psoriasis    scalp   Past Surgical History:    Procedure Laterality Date  . AV FISTULA PLACEMENT     left  . AV FISTULA PLACEMENT  09/12/2012   Procedure: INSERTION OF ARTERIOVENOUS (AV) GORE-TEX GRAFT ARM;  Surgeon: Elam Dutch, MD;  Location: [place];  Service: Vascular;  Laterality: Left;  KIDNEY TRANSPLANT FAILED  . CATARACT EXTRACTION, BILATERAL    . EYE SURGERY    . INSERTION OF DIALYSIS CATHETER  07/14/2012   Procedure: INSERTION OF DIALYSIS CATHETER;  Surgeon: Mal Misty, MD;  Location: [place];  Service: Vascular;  Laterality: Right;  Ultrasound Guided Insertion of Internal Jugular Dialysis Catheter  . KIDNEY TRANSPLANT  2013   did not work  . NEPHRECTOMY TRANSPLANTED ORGAN  08/11/12  . PHOTOCOAGULATION     for diabetic retinopathy 08/1999  . SHUNTOGRAM N/A 07/11/2012   Procedure: Earney Mallet;  Surgeon: Serafina Mitchell, MD;  Location: [facility] CATH LAB;  Service: Cardiovascular;  Laterality: N/A;    reports that he has never smoked. He has never used smokeless tobacco. He reports that he does not drink alcohol or use drugs. family history includes Bleeding Disorder in his brother; Diabetes in his father; Hypertension in his brother and mother; Kidney disease in his father; Other in his father. Allergies  Allergen Reactions  . Lactose Intolerance (Gi) Diarrhea    No whole milk!!  . Other Other (See Comments)    Soy milk burns his tongue  . Pollen Extract Other (See Comments)    Itching, watery eyes and sneezing.  Marland Kitchen  Tape Rash    PLEASE USE COBAN WRAP OR PAPER TAPE       Outpatient Encounter Prescriptions as of 03/18/2017  Medication Sig  . amoxicillin (AMOXIL) 500 MG tablet Take 2,000 mg by mouth See admin instructions. ONE HOUR PRIOR TO DENTAL APPOINTMENT  . aspirin EC 81 MG tablet Take 81 mg by mouth daily.  . Blood Glucose Monitoring Suppl (ACCU-CHEK NANO SMARTVIEW) W/DEVICE KIT Use as instructed  . calcitRIOL (ROCALTROL) 0.5 MCG capsule Take 1 capsule by mouth daily.   . calcium carbonate (OS-CAL - DOSED IN MG OF  ELEMENTAL CALCIUM) 1250 MG tablet Take 1 tablet by mouth 3 (three) times daily with meals.  . carvedilol (COREG) 12.5 MG tablet Take 12.5 mg by mouth 2 (two) times daily.  . dapsone 25 MG tablet Take 50 mg by mouth daily.  . folic acid (FOLVITE) 1 MG tablet Take 1 mg by mouth daily.   Marland Kitchen glucose blood (FREESTYLE LITE) test strip Check blood sugar 4 times a day before meals and bed. diag code E11.9. Insulin dependent  . insulin aspart (NOVOLOG) 100 UNIT/ML FlexPen Inject 12 units with breakfast, 12 units with lunch, 12 units with supper (Patient taking differently: Inject 0-20 Units into the skin 3 (three) times daily before meals. PER SLIDING SCALE)  . Insulin Pen Needle (PEN NEEDLES) 31G X 8 MM MISC Use 4 times daily to inject insulin into the skin. diag codeE11.9. Insulin dependent  . ketoconazole (NIZORAL) 2 % shampoo Apply topically 2 (two) times a week. Wash hair and beard.  Leave shampoo on skin for 5 mins before washing off.  . Lancets (FREESTYLE) lancets Check blood sugar 4 times a day before meals and bed. diag code E11.9. Insulin dependent  . magnesium oxide (MAG-OX) 400 MG tablet Take 400 mg by mouth daily.   . mycophenolate (MYFORTIC) 180 MG EC tablet Take 720 mg by mouth 2 (two) times daily.  Marland Kitchen omeprazole (PRILOSEC) 20 MG capsule Take 20 mg by mouth daily.  . predniSONE (DELTASONE) 5 MG tablet Take 5 mg by mouth daily with breakfast.  . sodium bicarbonate 650 MG tablet Take 1,300 mg by mouth 3 (three) times daily.   . tacrolimus (PROGRAF) 1 MG capsule Take 6 mg by mouth 2 (two) times daily.   . TRESIBA FLEXTOUCH 200 UNIT/ML SOPN Inject 66 Units into the skin at bedtime.  . Na Sulfate-K Sulfate-Mg Sulf 17.5-3.13-1.6 GM/180ML SOLN Take 1 kit by mouth as directed.   No facility-administered encounter medications on file as of 03/18/2017.      REVIEW OF SYSTEMS  : All other systems reviewed and negative except where noted in the History of Present Illness.   PHYSICAL EXAM: BP 112/64    Pulse 72   Ht 5' 5" (1.651 m)   Wt 146 lb (66.2 kg)   BMI 24.30 kg/m  General: Well developed black male in no acute distress Head: Normocephalic and atraumatic Eyes:  Sclerae anicteric, conjunctiva pink. Ears: Normal auditory acuity Lungs: Clear throughout to auscultation; no increased WOB. Heart: Regular rate and rhythm; no M/R/G. Abdomen: Soft, non-distended.  BS present.  Scars from surgeries noted and transplanted kidney felt on the left.  Non-tender. Rectal:  Will be done at the time of colonoscopy. Musculoskeletal: Symmetrical with no gross deformities  Skin: No lesions on visible extremities Extremities: No edema  Neurological: Alert oriented x 4, grossly non-focal Psychological:  Alert and cooperative. Normal mood and affect  ASSESSMENT AND PLAN: *46 year old male with  normocytic anemia (AOCD), iron studies normal, but heme positive stools:  No overt sign of bleeding.  Has renal transplant that is failing and now consider to have stage 3 CKD again.  Receiving Erythropoietin injections. He's never had colonoscopy in the past. Will scheduled for both colonoscopy and endoscopy with Dr. Henrene Pastor to rule out any cause of GI blood loss that could be contributing to his anemia. *IDDM:  Insulin will be adjusted prior to endoscopic procedure per protocol. Will resume normal dosing after procedure.  **The risks, benefits, and alternatives to EGD and colonoscopy were discussed with the patient and he consents to proceed.   CC:  Marton Redwood, MD CC:  Dr. Mercy Moore

## 2017-03-18 NOTE — Progress Notes (Signed)
Assessment and plans reviewed  

## 2017-03-18 NOTE — Patient Instructions (Signed)

## 2017-04-01 DIAGNOSIS — Z940 Kidney transplant status: Secondary | ICD-10-CM | POA: Diagnosis not present

## 2017-05-19 ENCOUNTER — Encounter: Payer: Self-pay | Admitting: Internal Medicine

## 2017-06-02 ENCOUNTER — Encounter: Payer: Medicare Other | Admitting: Internal Medicine

## 2017-06-02 ENCOUNTER — Telehealth: Payer: Self-pay | Admitting: Internal Medicine

## 2017-06-02 NOTE — Telephone Encounter (Signed)
Please Charge no-show fee

## 2017-07-18 ENCOUNTER — Telehealth: Payer: Self-pay | Admitting: Cardiology

## 2017-07-18 NOTE — Telephone Encounter (Signed)
Returned call, left msg w cardiac diagnoses & last EF, advised to call if questions.

## 2017-07-18 NOTE — Telephone Encounter (Signed)
Caryl Pina RN [facility]) is calling to confirm or Deny whether out Mr. Mcaulay has a Diagnosis of CHF .Please call .Marland Kitchen Thanks

## 2017-07-22 NOTE — Telephone Encounter (Signed)
PT BILLED.

## 2017-09-14 ENCOUNTER — Ambulatory Visit (INDEPENDENT_AMBULATORY_CARE_PROVIDER_SITE_OTHER): Payer: Medicare Other | Admitting: Physician Assistant

## 2017-09-14 ENCOUNTER — Ambulatory Visit (INDEPENDENT_AMBULATORY_CARE_PROVIDER_SITE_OTHER): Payer: Self-pay

## 2017-09-14 ENCOUNTER — Encounter (INDEPENDENT_AMBULATORY_CARE_PROVIDER_SITE_OTHER): Payer: Self-pay | Admitting: Orthopaedic Surgery

## 2017-09-14 DIAGNOSIS — M5442 Lumbago with sciatica, left side: Secondary | ICD-10-CM | POA: Diagnosis not present

## 2017-09-14 DIAGNOSIS — G8929 Other chronic pain: Secondary | ICD-10-CM | POA: Diagnosis not present

## 2017-09-14 NOTE — Progress Notes (Signed)
Office Visit Note   Patient: [name]           Date of Birth: Jul 17, 1971           MRN: [id number] Visit Date: 09/14/2017              Requested by: Marton Redwood, MD [address] [place], [place] [address] PCP: Marton Redwood, MD   Assessment & Plan: Visit Diagnoses:  1. Chronic bilateral low back pain with left-sided sciatica     Plan: Due to patient's foot drop and lower extremity weakness will obtain an MRI to rule out HNP.  See him back after the MRI to go over results to discuss further treatment.  Did not place him on steroids or other medications due to his rising kidney functions needs been told by his nephrologist not he can take Tylenol.  He has undergone a kidney transplant in the past.  Follow-Up Instructions: Return for After MRI.   Orders:  Orders Placed This Encounter  Procedures  . XR Lumbar Spine 2-3 Views  . MR Lumbar Spine w/o contrast   No orders of the defined types were placed in this encounter.     Procedures: No procedures performed   Clinical Data: No additional findings.   Subjective: Chief Complaint  Patient presents with  . Left Leg - Pain  . Left Knee - Pain    HPI [name] is a 47 year old male comes in today with left knee and left leg pain this been ongoing for the past month.  He also notes weakness of the left leg.  He often has to pick his leg up due to the fact that he can no longer lift his left leg enough to go up and down stairs or transfer into a bed.  Denies any numbness tingling down the leg but has pain that radiates from his hip down to his ankle.  Never goes into the toes.  He has had no known injury.  Denies any bowel bladder dysfunction.  He reports his been taking Tylenol but has recently been told by his nephrologist to not take any Tylenol due to the fact that his kidney functions are running higher and actually has a history of a kidney transplant.  Pain is keeping him awake at night.  He is began using a cane  to ambulate. Review of Systems No fevers chills shortness of breath chest pain.  Please see HPI otherwise negative  Objective: Vital Signs: There were no vitals taken for this visit.  Physical Exam  Constitutional: He is oriented to person, place, and time. He appears well-developed and well-nourished.  Cardiovascular: Intact distal pulses.  Pulmonary/Chest: Effort normal.  Neurological: He is alert and oriented to person, place, and time.  Skin: He is not diaphoretic.  Psychiatric: He has a normal mood and affect.    Ortho Exam Bilateral hips he has good range of motion with internal and external rotation.  Left hip slight tenderness over the trochanteric region and into the left buttocks region.  Is negative straight leg raise bilaterally tight hamstrings bilaterally.  Deep tendon reflexes are 1+ at the knees and equal and symmetric and 1+ at the ankles equal symmetric.  He has a foot drop on the left.  He is unable to extend the left lower leg.  4 out of 5 strength left leg hip flexor.  Right leg 5 out of 5 strength throughout.  Left knee no effusion abnormal warmth erythema he has some tenderness  along medial joint line no deficit felt at the patellar tibial tendon or femoral patellar tendon tendon. He has full extension flexion lumbar spine without pain.  Is unable to walk on his heels and toes due to his left leg.  He can initiate walking on his right heel and right tiptoes. Specialty Comments:  No specialty comments available.  Imaging: Xr Lumbar Spine 2-3 Views  Result Date: 09/14/2017 Lumbar spine AP lateral views: No acute fracture disc space overall well-maintained.  Slight retro-listhesis at L5-S1.  Facet changes at L5-S1.  Otherwise no bony abnormalities.  Normal lordotic curvature maintained.    PMFS History: Patient Active Problem List   Diagnosis Date Noted  . Normocytic anemia 03/18/2017  . Heme + stool 03/18/2017  . Hyperglycemia without ketosis 02-27-17  .  Deceased-donor kidney transplant 27-Feb-2017  . Type 2 diabetes mellitus with hyperosmolar nonketotic hyperglycemia ([place]) Feb 27, 2017  . Uncontrolled type 2 diabetes mellitus with hyperosmolar nonketotic hyperglycemia ([place]) 2017-02-27  . Congestive dilated cardiomyopathy ([place]) 07/16/2015  . Abnormal cardiovascular function study 07/16/2015  . Preventative health care 04/24/2014  . Tinea capitis 04/24/2014  . Dental infection 10/16/2013  . Hyperlipidemia LDL goal < 100 08/29/2013  . Proliferative diabetic retinopathy(362.02) 05/24/2013  . 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 DM2 (diabetes mellitus, type 2) ([place]) 01/15/2009  . ANEMIA OF RENAL FAILURE 01/15/2009  . Essential hypertension, benign 01/15/2009  . Dilated cardiomyopathy ([place]) 01/15/2009  . End stage renal disease ([place]) 01/15/2009  . SECONDARY HYPERPARATHYROIDISM 01/15/2009   Past Medical History:  Diagnosis Date  . Anemia   . CHF (congestive heart failure) ([place])   . Diabetes mellitus 12/2008   A1C 8.3 12/23/08, 24hr urine protein 2793 mg/day, SPEP neg, proliferative BDR s/p photocoagulation 08/2009 done at [facility] and f/u by DR Groat  . ESRD (end stage renal disease) on dialysis ([place])    1.Initiated HD via right per cate 12/30/2008 (MWF schedule at [facility]) 2.Left upper extremity A-V fistula by Dr Oneida Alar 12/30/2008 3.Aemia: Received Venofer 12/30/08 Ferritin 179 % sat 12 12/23/08, 4. Secondary Hyperparathyroidism- PTH 188, P 5.4, Ca 9.2 12/2008  . Hemodialysis-associated hypotension    coreg d/c'd  . History of blood transfusion   . Hypertension    resloved after initating HD, now with post HD hypotension  . Kidney transplant as cause of abnormal reaction or later complication 5397   [facility]  . Non-ischemic cardiomyopathy ([place])    EF 20-25% 12/23/2008>>EF 40-45% 03/2009>>EF ??? 7/?/2011  . Psoriasis    scalp    Family History  Problem Relation Age of Onset  . Diabetes Father   . Kidney disease Father   .  Other Father        amputation  . Hypertension Mother   . Hypertension Brother   . Bleeding Disorder Brother   . CAD Neg Hx   . Sudden Cardiac Death Neg Hx   . Congestive Heart Failure Neg Hx   . Colon cancer Neg Hx   . Rectal cancer Neg Hx   . Esophageal cancer Neg Hx   . Liver cancer Neg Hx     Past Surgical History:  Procedure Laterality Date  . AV FISTULA PLACEMENT     left  . AV FISTULA PLACEMENT  09/12/2012   Procedure: INSERTION OF ARTERIOVENOUS (AV) GORE-TEX GRAFT ARM;  Surgeon: Elam Dutch, MD;  Location: [place];  Service: Vascular;  Laterality: Left;  KIDNEY TRANSPLANT FAILED  . CATARACT EXTRACTION, BILATERAL    . EYE  SURGERY    . INSERTION OF DIALYSIS CATHETER  07/14/2012   Procedure: INSERTION OF DIALYSIS CATHETER;  Surgeon: Mal Misty, MD;  Location: [place];  Service: Vascular;  Laterality: Right;  Ultrasound Guided Insertion of Internal Jugular Dialysis Catheter  . KIDNEY TRANSPLANT  2013   did not work  . NEPHRECTOMY TRANSPLANTED ORGAN  08/11/12  . PHOTOCOAGULATION     for diabetic retinopathy 08/1999  . SHUNTOGRAM N/A 07/11/2012   Procedure: Earney Mallet;  Surgeon: Serafina Mitchell, MD;  Location: [facility] CATH LAB;  Service: Cardiovascular;  Laterality: N/A;   Social History   Occupational History  . Occupation: disabled  Tobacco Use  . Smoking status: Never Smoker  . Smokeless tobacco: Never Used  Substance and Sexual Activity  . Alcohol use: No    Alcohol/week: 0.0 oz  . Drug use: No  . Sexual activity: Not on file

## 2017-09-17 ENCOUNTER — Ambulatory Visit
Admission: RE | Admit: 2017-09-17 | Discharge: 2017-09-17 | Disposition: A | Discharge status: Home / Self Care | Payer: Medicare Other | Source: Ambulatory Visit | Attending: Physician Assistant | Admitting: Physician Assistant

## 2017-09-17 DIAGNOSIS — M5442 Lumbago with sciatica, left side: Principal | ICD-10-CM

## 2017-09-17 DIAGNOSIS — G8929 Other chronic pain: Secondary | ICD-10-CM

## 2017-09-22 ENCOUNTER — Ambulatory Visit (INDEPENDENT_AMBULATORY_CARE_PROVIDER_SITE_OTHER): Payer: Medicare Other | Admitting: Physician Assistant

## 2017-09-22 ENCOUNTER — Encounter (INDEPENDENT_AMBULATORY_CARE_PROVIDER_SITE_OTHER): Payer: Self-pay | Admitting: Physician Assistant

## 2017-09-22 DIAGNOSIS — M21372 Foot drop, left foot: Secondary | ICD-10-CM

## 2017-09-22 DIAGNOSIS — M5442 Lumbago with sciatica, left side: Secondary | ICD-10-CM | POA: Diagnosis not present

## 2017-09-22 DIAGNOSIS — G8929 Other chronic pain: Secondary | ICD-10-CM

## 2017-09-22 NOTE — Progress Notes (Signed)
Office Visit Note   Patient: [name]           Date of Birth: [birth year]           MRN: [id number] Visit Date: 09/22/2017              Requested by: Alexis Redwood, MD [address] [place], [place] [address] PCP: Alexis Redwood, MD   Assessment & Plan: Visit Diagnoses:  1. Left foot drop   2. Chronic bilateral low back pain with left-sided sciatica     Plan: We will refer him to [name] for evaluation and treatment of his back.  In the interim they have heavy lifting.  Questions were encouraged and answered with the patient and his wife is present today.  Follow-Up Instructions: Return if symptoms worsen or fail to improve.   Orders:  Orders Placed This Encounter  Procedures  . Ambulatory referral to Morgan   No orders of the defined types were placed in this encounter.     Procedures: No procedures performed   Clinical Data: No additional findings.   Subjective: Chief Complaint  Patient presents with  . Lower Back - Pain    HPI [name] returns today for follow-up of his low back pain and radicular symptoms involving the left lower extremity.  States he continues to have pain particularly at night whenever he tries to lie down.  He has had no bowel bladder dysfunction.  Continues to have weakness in the left lower leg particularly inability to bring his foot up completely and to lift his left leg. MRI of the lumbar spine dated 09/17/2017 is reviewed with the patient spine model was used for visual demonstration.  MRI showed L4-L5 central and leftward disc extrusion with migration of the free fragment caudally on the left.  Bilateral L5 nerve root impingement in the canal greater on the left due to the caudal migration of the fragment.  Moderate to severe stenosis is noted at this level.  L5-S1 disc space narrowing with a central disc protrusion and facet arthropathy.  Review of Systems See HPI otherwise  negative  Objective: Vital Signs: There were no vitals taken for this visit.  Physical Exam  Constitutional: He is oriented to person, place, and time. He appears well-developed and well-nourished. No distress.  Pulmonary/Chest: Effort normal.  Neurological: He is alert and oriented to person, place, and time.  Skin: He is not diaphoretic.  Psychiatric: He has a normal mood and affect.    Ortho Exam Left foot drops present.  He is able to extend his knee but only has 3 out of 5 strength.  He has weakness of his hip flexor 2 out of 5 strength. Specialty Comments:  No specialty comments available.  Imaging: No results found.   PMFS History: Patient Active Problem List   Diagnosis Date Noted  . Normocytic anemia 03/18/2017  . Heme + stool 03/18/2017  . Hyperglycemia without ketosis 03/08/2017  . Deceased-donor kidney transplant 2017-03-08  . Type 2 diabetes mellitus with hyperosmolar nonketotic hyperglycemia ([place]) Mar 08, 2017  . Uncontrolled type 2 diabetes mellitus with hyperosmolar nonketotic hyperglycemia ([place]) 03/08/17  . Congestive dilated cardiomyopathy ([place]) 07/16/2015  . Abnormal cardiovascular function study 07/16/2015  . Preventative health care 04/24/2014  . Tinea capitis 04/24/2014  . Dental infection 10/16/2013  . Hyperlipidemia LDL goal < 100 08/29/2013  . Proliferative diabetic retinopathy(362.02) 05/24/2013  . 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 DM2 (diabetes mellitus, type 2) ([place]) 01/15/2009  .  ANEMIA OF RENAL FAILURE 01/15/2009  . Essential hypertension, benign 01/15/2009  . Dilated cardiomyopathy ([place]) 01/15/2009  . End stage renal disease ([place]) 01/15/2009  . SECONDARY HYPERPARATHYROIDISM 01/15/2009   Past Medical History:  Diagnosis Date  . Anemia   . CHF (congestive heart failure) ([place])   . Diabetes mellitus 12/2008   A1C 8.3 12/23/08, 24hr urine protein 2793 mg/day, SPEP neg, proliferative BDR s/p photocoagulation 08/2009 done at [facility]  and f/u by DR Groat  . ESRD (end stage renal disease) on dialysis ([place])    1.Initiated HD via right per cate 12/30/2008 (MWF schedule at [facility]) 2.Left upper extremity A-V fistula by Dr Oneida Alar 12/30/2008 3.Aemia: Received Venofer 12/30/08 Ferritin 179 % sat 12 12/23/08, 4. Secondary Hyperparathyroidism- PTH 188, P 5.4, Ca 9.2 12/2008  . Hemodialysis-associated hypotension    coreg d/c'd  . History of blood transfusion   . Hypertension    resloved after initating HD, now with post HD hypotension  . Kidney transplant as cause of abnormal reaction or later complication 2585   [facility]  . Non-ischemic cardiomyopathy ([place])    EF 20-25% 12/23/2008>>EF 40-45% 03/2009>>EF ??? 7/?/2011  . Psoriasis    scalp    Family History  Problem Relation Age of Onset  . Diabetes Father   . Kidney disease Father   . Other Father        amputation  . Hypertension Mother   . Hypertension Brother   . Bleeding Disorder Brother   . CAD Neg Hx   . Sudden Cardiac Death Neg Hx   . Congestive Heart Failure Neg Hx   . Colon cancer Neg Hx   . Rectal cancer Neg Hx   . Esophageal cancer Neg Hx   . Liver cancer Neg Hx     Past Surgical History:  Procedure Laterality Date  . AV FISTULA PLACEMENT     left  . AV FISTULA PLACEMENT  09/12/2012   Procedure: INSERTION OF ARTERIOVENOUS (AV) GORE-TEX GRAFT ARM;  Surgeon: Elam Dutch, MD;  Location: [place];  Service: Vascular;  Laterality: Left;  KIDNEY TRANSPLANT FAILED  . CATARACT EXTRACTION, BILATERAL    . EYE SURGERY    . INSERTION OF DIALYSIS CATHETER  07/14/2012   Procedure: INSERTION OF DIALYSIS CATHETER;  Surgeon: Mal Misty, MD;  Location: [place];  Service: Vascular;  Laterality: Right;  Ultrasound Guided Insertion of Internal Jugular Dialysis Catheter  . KIDNEY TRANSPLANT  2013   did not work  . NEPHRECTOMY TRANSPLANTED ORGAN  08/11/12  . PHOTOCOAGULATION     for diabetic retinopathy 08/1999  . SHUNTOGRAM N/A 07/11/2012   Procedure:  Earney Mallet;  Surgeon: Serafina Mitchell, MD;  Location: [facility] CATH LAB;  Service: Cardiovascular;  Laterality: N/A;   Social History   Occupational History  . Occupation: disabled  Tobacco Use  . Smoking status: Never Smoker  . Smokeless tobacco: Never Used  Substance and Sexual Activity  . Alcohol use: No    Alcohol/week: 0.0 oz  . Drug use: No  . Sexual activity: Not on file

## 2017-09-27 LAB — HEMOGLOBIN A1C: Hemoglobin A1C: 15

## 2017-10-03 NOTE — Discharge Instructions (Signed)

## 2017-10-04 ENCOUNTER — Ambulatory Visit (HOSPITAL_COMMUNITY)
Admission: RE | Admit: 2017-10-04 | Discharge: 2017-10-04 | Disposition: A | Discharge status: Home / Self Care | Payer: Medicare Other | Source: Ambulatory Visit | Attending: Nephrology | Admitting: Nephrology

## 2017-10-04 VITALS — BP 118/69 | HR 90 | Temp 98.6°F | Resp 18

## 2017-10-04 DIAGNOSIS — N186 End stage renal disease: Secondary | ICD-10-CM

## 2017-10-04 DIAGNOSIS — D631 Anemia in chronic kidney disease: Secondary | ICD-10-CM | POA: Diagnosis present

## 2017-10-04 DIAGNOSIS — N184 Chronic kidney disease, stage 4 (severe): Secondary | ICD-10-CM | POA: Insufficient documentation

## 2017-10-04 LAB — POCT HEMOGLOBIN-HEMACUE: HEMOGLOBIN: 8.5 g/dL — AB (ref 13.0–17.0)

## 2017-10-04 MED ORDER — DARBEPOETIN ALFA 100 MCG/0.5ML IJ SOSY
100.0000 ug | PREFILLED_SYRINGE | INTRAMUSCULAR | Status: DC
  Administered 2017-10-04: 11:00:00 100 ug via SUBCUTANEOUS

## 2017-10-04 MED ORDER — DARBEPOETIN ALFA 100 MCG/0.5ML IJ SOSY
PREFILLED_SYRINGE | INTRAMUSCULAR | Status: AC
  Filled 2017-10-04: qty 0.5

## 2017-10-17 HISTORY — PX: OTHER SURGICAL HISTORY: SHX169

## 2017-11-29 ENCOUNTER — Inpatient Hospital Stay (HOSPITAL_COMMUNITY)
Admission: EM | Admit: 2017-11-29 | Discharge: 2017-12-07 | DRG: 683 | Disposition: A | Discharge status: Home Health | Payer: Medicare Other | Attending: Internal Medicine | Admitting: Internal Medicine

## 2017-11-29 ENCOUNTER — Other Ambulatory Visit: Payer: Self-pay

## 2017-11-29 ENCOUNTER — Encounter (HOSPITAL_COMMUNITY): Payer: Self-pay

## 2017-11-29 DIAGNOSIS — N2581 Secondary hyperparathyroidism of renal origin: Secondary | ICD-10-CM | POA: Diagnosis present

## 2017-11-29 DIAGNOSIS — I120 Hypertensive chronic kidney disease with stage 5 chronic kidney disease or end stage renal disease: Secondary | ICD-10-CM | POA: Diagnosis present

## 2017-11-29 DIAGNOSIS — E876 Hypokalemia: Secondary | ICD-10-CM | POA: Diagnosis present

## 2017-11-29 DIAGNOSIS — N179 Acute kidney failure, unspecified: Principal | ICD-10-CM | POA: Diagnosis present

## 2017-11-29 DIAGNOSIS — R197 Diarrhea, unspecified: Secondary | ICD-10-CM | POA: Diagnosis not present

## 2017-11-29 DIAGNOSIS — E872 Acidosis, unspecified: Secondary | ICD-10-CM | POA: Diagnosis present

## 2017-11-29 DIAGNOSIS — E1165 Type 2 diabetes mellitus with hyperglycemia: Secondary | ICD-10-CM | POA: Diagnosis present

## 2017-11-29 DIAGNOSIS — I420 Dilated cardiomyopathy: Secondary | ICD-10-CM | POA: Diagnosis present

## 2017-11-29 DIAGNOSIS — Z7982 Long term (current) use of aspirin: Secondary | ICD-10-CM | POA: Diagnosis not present

## 2017-11-29 DIAGNOSIS — D62 Acute posthemorrhagic anemia: Secondary | ICD-10-CM | POA: Diagnosis present

## 2017-11-29 DIAGNOSIS — Z794 Long term (current) use of insulin: Secondary | ICD-10-CM

## 2017-11-29 DIAGNOSIS — A040 Enteropathogenic Escherichia coli infection: Secondary | ICD-10-CM | POA: Diagnosis present

## 2017-11-29 DIAGNOSIS — Z940 Kidney transplant status: Secondary | ICD-10-CM | POA: Diagnosis not present

## 2017-11-29 DIAGNOSIS — D696 Thrombocytopenia, unspecified: Secondary | ICD-10-CM | POA: Diagnosis present

## 2017-11-29 DIAGNOSIS — E860 Dehydration: Secondary | ICD-10-CM | POA: Diagnosis present

## 2017-11-29 DIAGNOSIS — E1122 Type 2 diabetes mellitus with diabetic chronic kidney disease: Secondary | ICD-10-CM | POA: Diagnosis present

## 2017-11-29 DIAGNOSIS — N185 Chronic kidney disease, stage 5: Secondary | ICD-10-CM | POA: Diagnosis present

## 2017-11-29 DIAGNOSIS — E119 Type 2 diabetes mellitus without complications: Secondary | ICD-10-CM

## 2017-11-29 DIAGNOSIS — D631 Anemia in chronic kidney disease: Secondary | ICD-10-CM | POA: Diagnosis present

## 2017-11-29 DIAGNOSIS — I10 Essential (primary) hypertension: Secondary | ICD-10-CM | POA: Diagnosis present

## 2017-11-29 DIAGNOSIS — E1100 Type 2 diabetes mellitus with hyperosmolarity without nonketotic hyperglycemic-hyperosmolar coma (NKHHC): Secondary | ICD-10-CM | POA: Diagnosis not present

## 2017-11-29 DIAGNOSIS — E871 Hypo-osmolality and hyponatremia: Secondary | ICD-10-CM | POA: Diagnosis present

## 2017-11-29 HISTORY — DX: Acute kidney failure, unspecified: N17.9

## 2017-11-29 LAB — CBC WITH DIFFERENTIAL/PLATELET
BASOS ABS: 0 10*3/uL (ref 0.0–0.1)
Basophils Relative: 0 %
EOS ABS: 0.4 10*3/uL (ref 0.0–0.7)
EOS PCT: 6 %
HCT: 25.3 % — ABNORMAL LOW (ref 39.0–52.0)
Hemoglobin: 8.1 g/dL — ABNORMAL LOW (ref 13.0–17.0)
Lymphocytes Relative: 21 %
Lymphs Abs: 1.4 10*3/uL (ref 0.7–4.0)
MCH: 26.1 pg (ref 26.0–34.0)
MCHC: 32 g/dL (ref 30.0–36.0)
MCV: 81.6 fL (ref 78.0–100.0)
Monocytes Absolute: 0.7 10*3/uL (ref 0.1–1.0)
Monocytes Relative: 11 %
Neutro Abs: 4 10*3/uL (ref 1.7–7.7)
Neutrophils Relative %: 62 %
PLATELETS: 117 10*3/uL — AB (ref 150–400)
RBC: 3.1 MIL/uL — AB (ref 4.22–5.81)
RDW: 15.6 % — AB (ref 11.5–15.5)
WBC: 6.4 10*3/uL (ref 4.0–10.5)

## 2017-11-29 LAB — COMPREHENSIVE METABOLIC PANEL
ALK PHOS: 94 U/L (ref 38–126)
ALT: 12 U/L — AB (ref 17–63)
AST: 12 U/L — AB (ref 15–41)
Albumin: 4.3 g/dL (ref 3.5–5.0)
Anion gap: 14 (ref 5–15)
BILIRUBIN TOTAL: 0.6 mg/dL (ref 0.3–1.2)
BUN: 84 mg/dL — AB (ref 6–20)
CO2: 16 mmol/L — ABNORMAL LOW (ref 22–32)
Calcium: 10.5 mg/dL — ABNORMAL HIGH (ref 8.9–10.3)
Chloride: 98 mmol/L — ABNORMAL LOW (ref 101–111)
Creatinine, Ser: 8.14 mg/dL — ABNORMAL HIGH (ref 0.61–1.24)
GFR calc Af Amer: 8 mL/min — ABNORMAL LOW (ref >60)
GFR, EST NON AFRICAN AMERICAN: 7 mL/min — AB (ref >60)
Glucose, Bld: 555 mg/dL (ref 65–99)
Potassium: 5.2 mmol/L — ABNORMAL HIGH (ref 3.5–5.1)
Sodium: 128 mmol/L — ABNORMAL LOW (ref 135–145)
TOTAL PROTEIN: 6.9 g/dL (ref 6.5–8.1)

## 2017-11-29 LAB — CBG MONITORING, ED
GLUCOSE-CAPILLARY: 359 mg/dL — AB (ref 65–99)
GLUCOSE-CAPILLARY: 440 mg/dL — AB (ref 65–99)
Glucose-Capillary: 481 mg/dL — ABNORMAL HIGH (ref 65–99)

## 2017-11-29 LAB — LIPASE, BLOOD: Lipase: 39 U/L (ref 11–51)

## 2017-11-29 MED ORDER — ONDANSETRON HCL 4 MG PO TABS
4.0000 mg | ORAL_TABLET | Freq: Four times a day (QID) | ORAL | Status: DC | PRN
  Administered 2017-12-04: 4 mg via ORAL
  Filled 2017-11-29: qty 1

## 2017-11-29 MED ORDER — SODIUM CHLORIDE 0.9 % IV BOLUS
1000.0000 mL | Freq: Once | INTRAVENOUS | Status: AC
  Administered 2017-11-29: 1000 mL via INTRAVENOUS

## 2017-11-29 MED ORDER — TACROLIMUS 1 MG PO CAPS
6.0000 mg | ORAL_CAPSULE | Freq: Every day | ORAL | Status: DC
  Administered 2017-11-30: 6 mg via ORAL
  Filled 2017-11-29 (×2): qty 6

## 2017-11-29 MED ORDER — DEXTROSE 50 % IV SOLN: 25.0000 mL | INTRAVENOUS | Status: DC | PRN

## 2017-11-29 MED ORDER — INSULIN REGULAR BOLUS VIA INFUSION
0.0000 [IU] | Freq: Three times a day (TID) | INTRAVENOUS | Status: DC
  Filled 2017-11-29: qty 10

## 2017-11-29 MED ORDER — STERILE WATER FOR INJECTION IV SOLN
INTRAVENOUS | Status: DC
  Administered 2017-11-30 – 2017-12-03 (×4): via INTRAVENOUS
  Filled 2017-11-29 (×10): qty 850

## 2017-11-29 MED ORDER — SODIUM CHLORIDE 0.9 % IV SOLN
INTRAVENOUS | Status: DC
  Administered 2017-11-30: via INTRAVENOUS

## 2017-11-29 MED ORDER — SODIUM CHLORIDE 0.9 % IV SOLN
INTRAVENOUS | Status: DC
  Administered 2017-11-30: 3 [IU]/h via INTRAVENOUS
  Filled 2017-11-29: qty 1

## 2017-11-29 MED ORDER — HYDROCODONE-ACETAMINOPHEN 5-325 MG PO TABS: 1.0000 | ORAL_TABLET | Freq: Four times a day (QID) | ORAL | Status: DC | PRN

## 2017-11-29 MED ORDER — MYCOPHENOLATE SODIUM 180 MG PO TBEC
720.0000 mg | DELAYED_RELEASE_TABLET | Freq: Two times a day (BID) | ORAL | Status: DC
  Administered 2017-11-30 – 2017-12-07 (×15): 720 mg via ORAL
  Filled 2017-11-29 (×18): qty 4

## 2017-11-29 MED ORDER — HEPARIN SODIUM (PORCINE) 5000 UNIT/ML IJ SOLN
5000.0000 [IU] | Freq: Three times a day (TID) | INTRAMUSCULAR | Status: DC
  Administered 2017-11-30 – 2017-12-01 (×6): 5000 [IU] via SUBCUTANEOUS
  Filled 2017-11-29 (×8): qty 1

## 2017-11-29 MED ORDER — TACROLIMUS 1 MG PO CAPS
5.0000 mg | ORAL_CAPSULE | Freq: Every day | ORAL | Status: DC
  Filled 2017-11-29: qty 5

## 2017-11-29 MED ORDER — ASPIRIN EC 81 MG PO TBEC
81.0000 mg | DELAYED_RELEASE_TABLET | Freq: Every day | ORAL | Status: DC
  Administered 2017-11-30 – 2017-12-07 (×8): 81 mg via ORAL
  Filled 2017-11-29 (×8): qty 1

## 2017-11-29 MED ORDER — FOLIC ACID 1 MG PO TABS
1.0000 mg | ORAL_TABLET | Freq: Every day | ORAL | Status: DC
  Administered 2017-11-30 – 2017-12-07 (×8): 1 mg via ORAL
  Filled 2017-11-29 (×8): qty 1

## 2017-11-29 MED ORDER — INSULIN ASPART 100 UNIT/ML ~~LOC~~ SOLN
10.0000 [IU] | Freq: Once | SUBCUTANEOUS | Status: AC
  Administered 2017-11-29: 10 [IU] via INTRAVENOUS
  Filled 2017-11-29: qty 1

## 2017-11-29 MED ORDER — ONDANSETRON 4 MG PO TBDP
4.0000 mg | ORAL_TABLET | Freq: Once | ORAL | Status: AC
  Administered 2017-11-29: 4 mg via ORAL
  Filled 2017-11-29: qty 1

## 2017-11-29 MED ORDER — ONDANSETRON HCL 4 MG/2ML IJ SOLN
4.0000 mg | Freq: Four times a day (QID) | INTRAMUSCULAR | Status: DC | PRN
  Administered 2017-12-01 – 2017-12-05 (×6): 4 mg via INTRAVENOUS
  Filled 2017-11-29 (×6): qty 2

## 2017-11-29 MED ORDER — DAPSONE 25 MG PO TABS
50.0000 mg | ORAL_TABLET | Freq: Every day | ORAL | Status: DC
  Administered 2017-11-30 – 2017-12-07 (×8): 50 mg via ORAL
  Filled 2017-11-29 (×8): qty 2

## 2017-11-29 MED ORDER — SODIUM BICARBONATE 650 MG PO TABS
1300.0000 mg | ORAL_TABLET | Freq: Three times a day (TID) | ORAL | Status: DC
  Administered 2017-11-30 – 2017-12-05 (×15): 1300 mg via ORAL
  Filled 2017-11-29 (×19): qty 2

## 2017-11-29 MED ORDER — DEXTROSE-NACL 5-0.45 % IV SOLN
INTRAVENOUS | Status: DC
  Administered 2017-11-30: 02:00:00 via INTRAVENOUS

## 2017-11-29 MED ORDER — ACETAMINOPHEN 500 MG PO TABS
1000.0000 mg | ORAL_TABLET | Freq: Four times a day (QID) | ORAL | Status: DC | PRN
  Administered 2017-12-02 – 2017-12-06 (×5): 1000 mg via ORAL
  Filled 2017-11-29 (×5): qty 2

## 2017-11-29 NOTE — ED Notes (Signed)
Admitting MD Gardner at bedside. 

## 2017-11-29 NOTE — ED Triage Notes (Signed)
Pt presents for evaluation of diarrhea since 4/4 & nausea. Pt reports urgency of diarrhea. Denies blood in stool. Hx of anemia. Recent outpatient back procedure in march.

## 2017-11-29 NOTE — ED Provider Notes (Signed)
Patient placed in Quick Look pathway, seen and evaluated   Chief Complaint: diarrhea x12 days  HPI:   Pt is a 47 y/o male with a PMHx of ESRD s/p kidney transplant no longer on dialysis, DM2, CHF, anemia, HTN, and other chronic comorbidities, presenting today with c/o diarrhea for the last 12 days after [facility] [employer].  He states he has had at least 6 episodes daily of nonbloody diarrhea.  He has also had nausea as well as central and right lower quadrant abdominal pain that comes in waves when he has the urge to have a BM, and then resolves after having the BM.  Two days ago he also had one episode of nonbloody nonbilious emesis consisting of undigested food but has not had any further emesis since then.  He denies any recent antibiotics.  He also denies any fevers, chills, hematemesis, melena, hematochezia, constipation, obstipation, dysuria, hematuria, or any other complaints at this time.  ROS: +nausea/vomiting, +diarrhea, +intermittent abd pain. No fevers, chills, hematemesis, melena, hematochezia, constipation, obstipation, dysuria, hematuria.  Physical Exam:  BP 116/80 (BP Location: Right Arm)   Pulse 95   Temp 98.1 F (36.7 C) (Oral)   Resp 16   SpO2 100%    Gen: No distress  Neuro: Awake and Alert  Skin: Warm    Focused Exam: abdomen soft, NTND, +BS throughout, no r/g/r, neg murphy's, neg mcburney's, no CVA TTP    Initiation of care has begun. The patient has been counseled on the process, plan, and necessity for staying for the completion/evaluation, and the remainder of the medical screening examination     [address], [place], [place] 11/29/17 1856    Carmin Muskrat, MD 12/01/17 1520

## 2017-11-29 NOTE — ED Notes (Signed)
EKG done on portable machine due to monitor not showing leads.

## 2017-11-29 NOTE — ED Provider Notes (Signed)
[facility] EMERGENCY DEPARTMENT Provider Note   CSN: [id number] Arrival date & time: 11/29/17  1837     History   Chief Complaint Chief Complaint  Patient presents with  . Diarrhea    HPI [name] is a 47 y.o. male.  47 year old male with prior history of anemia, diabetes, renal transplant, and hypertension presents with profound diarrhea.  Patient reports profound diarrhea for the last 12 days.  Patient reports intermittent nausea as well.  Patient reports a renal transplant approximately 4 years prior [place].  Patient reports that his baseline creatinine is 2-3.  Patient denies chest pain, fever, shortness of breath, or other acute complaint.  The history is provided by the patient.  Diarrhea   This is a new problem. The current episode started more than 1 week ago. The problem occurs more than 10 times per day. The problem has not changed since onset.The stool consistency is described as watery. There has been no fever. Pertinent negatives include no chills and no sweats. He has tried nothing for the symptoms.    Past Medical History:  Diagnosis Date  . Anemia   . CHF (congestive heart failure) ([place])   . Diabetes mellitus 12/2008   A1C 8.3 12/23/08, 24hr urine protein 2793 mg/day, SPEP neg, proliferative BDR s/p photocoagulation 08/2009 done at [facility] and f/u by DR Groat  . ESRD (end stage renal disease) on dialysis ([place])    1.Initiated HD via right per cate 12/30/2008 (MWF schedule at [facility]) 2.Left upper extremity A-V fistula by Dr Oneida Alar 12/30/2008 3.Aemia: Received Venofer 12/30/08 Ferritin 179 % sat 12 12/23/08, 4. Secondary Hyperparathyroidism- PTH 188, P 5.4, Ca 9.2 12/2008  . Hemodialysis-associated hypotension    coreg d/c'd  . History of blood transfusion   . Hypertension    resloved after initating HD, now with post HD hypotension  . Kidney transplant as cause of abnormal reaction or later complication 1829   [facility]  .  Non-ischemic cardiomyopathy ([place])    EF 20-25% 12/23/2008>>EF 40-45% 03/2009>>EF ??? 7/?/2011  . Psoriasis    scalp    Patient Active Problem List   Diagnosis Date Noted  . Normocytic anemia 03/18/2017  . Heme + stool 03/18/2017  . Hyperglycemia without ketosis Feb 12, 2017  . Deceased-donor kidney transplant February 12, 2017  . Type 2 diabetes mellitus with hyperosmolar nonketotic hyperglycemia ([place]) Feb 12, 2017  . Uncontrolled type 2 diabetes mellitus with hyperosmolar nonketotic hyperglycemia ([place]) 02-12-2017  . Congestive dilated cardiomyopathy ([place]) 07/16/2015  . Abnormal cardiovascular function study 07/16/2015  . Preventative health care 04/24/2014  . Tinea capitis 04/24/2014  . Dental infection 10/16/2013  . Hyperlipidemia LDL goal < 100 08/29/2013  . Proliferative diabetic retinopathy(362.02) 05/24/2013  . 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 DM2 (diabetes mellitus, type 2) ([place]) 01/15/2009  . ANEMIA OF RENAL FAILURE 01/15/2009  . Essential hypertension, benign 01/15/2009  . Dilated cardiomyopathy ([place]) 01/15/2009  . End stage renal disease ([place]) 01/15/2009  . SECONDARY HYPERPARATHYROIDISM 01/15/2009    Past Surgical History:  Procedure Laterality Date  . AV FISTULA PLACEMENT     left  . AV FISTULA PLACEMENT  09/12/2012   Procedure: INSERTION OF ARTERIOVENOUS (AV) GORE-TEX GRAFT ARM;  Surgeon: Elam Dutch, MD;  Location: [facility];  Service: Vascular;  Laterality: Left;  KIDNEY TRANSPLANT FAILED  . CATARACT EXTRACTION, BILATERAL    . EYE SURGERY    . INSERTION OF DIALYSIS CATHETER  07/14/2012   Procedure: INSERTION OF DIALYSIS CATHETER;  Surgeon: Nelda Severe  Kellie Simmering, MD;  Location: [facility] OR;  Service: Vascular;  Laterality: Right;  Ultrasound Guided Insertion of Internal Jugular Dialysis Catheter  . KIDNEY TRANSPLANT  2013   did not work  . NEPHRECTOMY TRANSPLANTED ORGAN  08/11/12  . PHOTOCOAGULATION     for diabetic retinopathy 08/1999  . SHUNTOGRAM N/A 07/11/2012    Procedure: Earney Mallet;  Surgeon: Serafina Mitchell, MD;  Location: [facility] CATH LAB;  Service: Cardiovascular;  Laterality: N/A;        Home Medications    Prior to Admission medications   Medication Sig Start Date End Date Taking? Authorizing Provider  acetaminophen (TYLENOL) 500 MG tablet Take 1,000 mg by mouth every 6 (six) hours as needed for mild pain.   Yes [provider]  amoxicillin (AMOXIL) 500 MG tablet Take 2,000 mg by mouth See admin instructions. ONE HOUR PRIOR TO DENTAL APPOINTMENT 03/09/16  Yes [provider]  aspirin EC 81 MG tablet Take 81 mg by mouth daily.   Yes [provider]  bismuth subsalicylate (PEPTO BISMOL) 262 MG/15ML suspension Take 30 mLs by mouth every 6 (six) hours as needed for diarrhea or loose stools.   Yes [provider]  Blood Glucose Monitoring Suppl (ACCU-CHEK NANO SMARTVIEW) W/DEVICE KIT Use as instructed 04/24/14  Yes Kelby Aline, MD  calcitRIOL (ROCALTROL) 0.5 MCG capsule Take 1 capsule by mouth daily.  06/03/15  Yes [provider]  calcium carbonate (OS-CAL - DOSED IN MG OF ELEMENTAL CALCIUM) 1250 MG tablet Take 1 tablet by mouth 3 (three) times daily with meals.   Yes [provider]  carvedilol (COREG) 12.5 MG tablet Take 12.5 mg by mouth 2 (two) times daily. 03/07/14  Yes [provider]  dapsone 25 MG tablet Take 50 mg by mouth daily.   Yes [provider]  folic acid (FOLVITE) 1 MG tablet Take 1 mg by mouth daily.  06/24/15  Yes [provider]  glucose blood (FREESTYLE LITE) test strip Check blood sugar 4 times a day before meals and bed. diag code E11.9. Insulin dependent 12/25/14  Yes Annia Belt, MD  HYDROcodone-acetaminophen (NORCO/VICODIN) 5-325 MG tablet Take 1 tablet by mouth every 6 (six) hours as needed for moderate pain.   Yes [provider]  insulin aspart (NOVOLOG) 100 UNIT/ML FlexPen Inject 12 units with breakfast, 12 units with lunch, 12  units with supper Patient taking differently: Inject 0-20 Units into the skin 3 (three) times daily before meals. PER SLIDING SCALE 01/29/15  Yes Kelby Aline, MD  Insulin Pen Needle (PEN NEEDLES) 31G X 8 MM MISC Use 4 times daily to inject insulin into the skin. diag codeE11.9. Insulin dependent 01/29/15  Yes Bartholomew Crews, MD  ketoconazole (NIZORAL) 2 % shampoo Apply topically 2 (two) times a week. Wash hair and beard.  Leave shampoo on skin for 5 mins before washing off. 04/24/14  Yes Kelby Aline, MD  Lancets (FREESTYLE) lancets Check blood sugar 4 times a day before meals and bed. diag code E11.9. Insulin dependent 12/31/14  Yes Sid Falcon, MD  loperamide (IMODIUM A-D) 2 MG tablet Take 2 mg by mouth 4 (four) times daily as needed for diarrhea or loose stools.   Yes [provider]  magnesium oxide (MAG-OX) 400 MG tablet Take 400 mg by mouth daily.  07/01/15  Yes [provider]  mycophenolate (MYFORTIC) 180 MG EC tablet Take 720 mg by mouth 2 (two) times daily.   Yes [provider]  omeprazole (PRILOSEC) 20 MG capsule Take 20 mg by mouth daily.   Yes [provider]  sodium bicarbonate 650 MG tablet Take 1,300 mg by mouth 3 (three) times daily.  06/24/15  Yes [provider]  tacrolimus (PROGRAF) 1 MG capsule Take by mouth 2 (two) times daily. 6 mg in the morning and 5 mg in the evening   Yes [provider]  TRESIBA FLEXTOUCH 200 UNIT/ML SOPN Inject 66 Units into the skin at bedtime. Patient taking differently: Inject 50 Units into the skin at bedtime.  02/09/17  Yes Reyne Dumas, MD    Family History Family History  Problem Relation Age of Onset  . Diabetes Father   . Kidney disease Father   . Other Father        amputation  . Hypertension Mother   . Hypertension Brother   . Bleeding Disorder Brother   . CAD Neg Hx   . Sudden Cardiac Death Neg Hx   . Congestive Heart Failure Neg Hx   . Colon cancer Neg Hx   .  Rectal cancer Neg Hx   . Esophageal cancer Neg Hx   . Liver cancer Neg Hx     Social History Social History   Tobacco Use  . Smoking status: Never Smoker  . Smokeless tobacco: Never Used  Substance Use Topics  . Alcohol use: No    Alcohol/week: 0.0 oz  . Drug use: No     Allergies   Lactose intolerance (gi); Other; Pollen extract; and Tape   Review of Systems Review of Systems  Constitutional: Negative for chills.  Gastrointestinal: Positive for diarrhea.  All other systems reviewed and are negative.    Physical Exam Updated Vital Signs BP 126/67   Pulse 98   Temp 98.1 F (36.7 C) (Oral)   Resp 17   SpO2 100%   Physical Exam  Constitutional: He is oriented to person, place, and time. He appears well-developed and well-nourished. No distress.  Pale   HENT:  Head: Normocephalic and atraumatic.  Mouth/Throat: Oropharynx is clear and moist.  Eyes: Pupils are equal, round, and reactive to light. Conjunctivae and EOM are normal.  Neck: Normal range of motion. Neck supple.  Cardiovascular: Normal rate, regular rhythm and normal heart sounds.  Pulmonary/Chest: Effort normal and breath sounds normal. No respiratory distress.  Abdominal: Soft. He exhibits no distension. There is no tenderness.  Musculoskeletal: Normal range of motion. He exhibits no edema or deformity.  Neurological: He is alert and oriented to person, place, and time.  Skin: Skin is warm and dry.  Psychiatric: He has a normal mood and affect.  Nursing note and vitals reviewed.    ED Treatments / Results  Labs (all labs ordered are listed, but only abnormal results are displayed) Labs Reviewed  COMPREHENSIVE METABOLIC PANEL - Abnormal; Notable for the following components:      Result Value   Sodium 128 (*)    Potassium 5.2 (*)    Chloride 98 (*)    CO2 16 (*)    Glucose, Bld 555 (*)    BUN 84 (*)    Creatinine, Ser 8.14 (*)    Calcium 10.5 (*)    AST 12 (*)    ALT 12 (*)    GFR calc  non Af Amer 7 (*)    GFR calc Af Amer 8 (*)    All other components within normal limits  CBC WITH DIFFERENTIAL/PLATELET - Abnormal; Notable for the following components:  RBC 3.10 (*)    Hemoglobin 8.1 (*)    HCT 25.3 (*)    RDW 15.6 (*)    Platelets 117 (*)    All other components within normal limits  CBG MONITORING, ED - Abnormal; Notable for the following components:   Glucose-Capillary 481 (*)    All other components within normal limits  LIPASE, BLOOD  URINALYSIS, ROUTINE W REFLEX MICROSCOPIC    EKG None  Radiology No results found.  Procedures Procedures (including critical care time)  Medications Ordered in ED Medications  dextrose 5 %-0.45 % sodium chloride infusion (has no administration in time range)  insulin regular bolus via infusion 0-10 Units (has no administration in time range)  insulin regular (NOVOLIN R,HUMULIN R) 100 Units in sodium chloride 0.9 % 100 mL (1 Units/mL) infusion (has no administration in time range)  dextrose 50 % solution 25 mL (has no administration in time range)  0.9 %  sodium chloride infusion (has no administration in time range)  ondansetron (ZOFRAN-ODT) disintegrating tablet 4 mg (4 mg Oral Given 11/29/17 1857)  sodium chloride 0.9 % bolus 1,000 mL (1,000 mLs Intravenous New Bag/Given 11/29/17 2201)  insulin aspart (novoLOG) injection 10 Units (10 Units Intravenous Given 11/29/17 2201)     Initial Impression / Assessment and Plan / ED Course  I have reviewed the triage vital signs and the nursing notes.  Pertinent labs & imaging results that were available during my care of the patient were reviewed by me and considered in my medical decision making (see chart for details).     Case discussed with nephrology - they are okay with admission to hospitalist service and they will follow as consult.   MDM Screen complete  Patient is presenting with more than 1 week of diarrhea with evidence of profound dehydration.  Patient's  renal function is worse than his baseline.  This is concerning given his reported history of renal transplant.  Case was discussed with oncall nephrology and they recommend admission to [facility] for further workup and treatment.  Hospitalist service is aware of case and will evaluate the patient for admission.  Final Clinical Impressions(s) / ED Diagnoses   Final diagnoses:  Diarrhea, unspecified type  Acute renal failure, unspecified acute renal failure type [facility])    ED Discharge Orders    None       Valarie Merino, MD 11/29/17 2307

## 2017-11-29 NOTE — ED Notes (Signed)
ED Provider at bedside. 

## 2017-11-30 DIAGNOSIS — E871 Hypo-osmolality and hyponatremia: Secondary | ICD-10-CM

## 2017-11-30 DIAGNOSIS — R197 Diarrhea, unspecified: Secondary | ICD-10-CM

## 2017-11-30 DIAGNOSIS — Z940 Kidney transplant status: Secondary | ICD-10-CM

## 2017-11-30 DIAGNOSIS — E1100 Type 2 diabetes mellitus with hyperosmolarity without nonketotic hyperglycemic-hyperosmolar coma (NKHHC): Secondary | ICD-10-CM

## 2017-11-30 DIAGNOSIS — I10 Essential (primary) hypertension: Secondary | ICD-10-CM

## 2017-11-30 DIAGNOSIS — E872 Acidosis: Secondary | ICD-10-CM

## 2017-11-30 LAB — CBG MONITORING, ED
GLUCOSE-CAPILLARY: 164 mg/dL — AB (ref 65–99)
GLUCOSE-CAPILLARY: 118 mg/dL — AB (ref 65–99)
GLUCOSE-CAPILLARY: 143 mg/dL — AB (ref 65–99)
GLUCOSE-CAPILLARY: 135 mg/dL — AB (ref 65–99)
GLUCOSE-CAPILLARY: 166 mg/dL — AB (ref 65–99)
Glucose-Capillary: 255 mg/dL — ABNORMAL HIGH (ref 65–99)
Glucose-Capillary: 188 mg/dL — ABNORMAL HIGH (ref 65–99)
Glucose-Capillary: 149 mg/dL — ABNORMAL HIGH (ref 65–99)
Glucose-Capillary: 143 mg/dL — ABNORMAL HIGH (ref 65–99)
Glucose-Capillary: 278 mg/dL — ABNORMAL HIGH (ref 65–99)
Glucose-Capillary: 277 mg/dL — ABNORMAL HIGH (ref 65–99)
Glucose-Capillary: 246 mg/dL — ABNORMAL HIGH (ref 65–99)
Glucose-Capillary: 257 mg/dL — ABNORMAL HIGH (ref 65–99)

## 2017-11-30 LAB — BASIC METABOLIC PANEL
ANION GAP: 13 (ref 5–15)
Anion gap: 14 (ref 5–15)
Anion gap: 11 (ref 5–15)
Anion gap: 13 (ref 5–15)
BUN: 81 mg/dL — ABNORMAL HIGH (ref 6–20)
BUN: 79 mg/dL — ABNORMAL HIGH (ref 6–20)
BUN: 75 mg/dL — ABNORMAL HIGH (ref 6–20)
BUN: 67 mg/dL — ABNORMAL HIGH (ref 6–20)
CALCIUM: 9.8 mg/dL (ref 8.9–10.3)
CALCIUM: 9.8 mg/dL (ref 8.9–10.3)
CALCIUM: 9.6 mg/dL (ref 8.9–10.3)
CALCIUM: 9.6 mg/dL (ref 8.9–10.3)
CO2: 14 mmol/L — ABNORMAL LOW (ref 22–32)
CO2: 16 mmol/L — ABNORMAL LOW (ref 22–32)
CO2: 19 mmol/L — AB (ref 22–32)
CO2: 20 mmol/L — ABNORMAL LOW (ref 22–32)
CREATININE: 7.77 mg/dL — AB (ref 0.61–1.24)
CREATININE: 7.41 mg/dL — AB (ref 0.61–1.24)
CREATININE: 7.27 mg/dL — AB (ref 0.61–1.24)
CREATININE: 6.95 mg/dL — AB (ref 0.61–1.24)
Chloride: 104 mmol/L (ref 101–111)
Chloride: 105 mmol/L (ref 101–111)
Chloride: 104 mmol/L (ref 101–111)
Chloride: 99 mmol/L — ABNORMAL LOW (ref 101–111)
GFR calc Af Amer: 9 mL/min — ABNORMAL LOW (ref >60)
GFR calc non Af Amer: 8 mL/min — ABNORMAL LOW (ref >60)
GFR, EST AFRICAN AMERICAN: 8 mL/min — AB (ref >60)
GFR, EST AFRICAN AMERICAN: 9 mL/min — AB (ref >60)
GFR, EST AFRICAN AMERICAN: 10 mL/min — AB (ref >60)
GFR, EST NON AFRICAN AMERICAN: 7 mL/min — AB (ref >60)
GFR, EST NON AFRICAN AMERICAN: 8 mL/min — AB (ref >60)
GFR, EST NON AFRICAN AMERICAN: 8 mL/min — AB (ref >60)
Glucose, Bld: 308 mg/dL — ABNORMAL HIGH (ref 65–99)
Glucose, Bld: 169 mg/dL — ABNORMAL HIGH (ref 65–99)
Glucose, Bld: 143 mg/dL — ABNORMAL HIGH (ref 65–99)
Glucose, Bld: 245 mg/dL — ABNORMAL HIGH (ref 65–99)
Potassium: 4.3 mmol/L (ref 3.5–5.1)
Potassium: 3.9 mmol/L (ref 3.5–5.1)
Potassium: 3.6 mmol/L (ref 3.5–5.1)
Potassium: 3.5 mmol/L (ref 3.5–5.1)
SODIUM: 131 mmol/L — AB (ref 135–145)
SODIUM: 135 mmol/L (ref 135–145)
SODIUM: 134 mmol/L — AB (ref 135–145)
Sodium: 132 mmol/L — ABNORMAL LOW (ref 135–145)

## 2017-11-30 LAB — LACTIC ACID, PLASMA
LACTIC ACID, VENOUS: 1.2 mmol/L (ref 0.5–1.9)
Lactic Acid, Venous: 2 mmol/L (ref 0.5–1.9)

## 2017-11-30 LAB — BETA-HYDROXYBUTYRIC ACID: Beta-Hydroxybutyric Acid: 0.15 mmol/L (ref 0.05–0.27)

## 2017-11-30 LAB — GASTROINTESTINAL PANEL BY PCR, STOOL (REPLACES STOOL CULTURE)
Adenovirus F40/41: NOT DETECTED
Astrovirus: NOT DETECTED
CRYPTOSPORIDIUM: NOT DETECTED
Campylobacter species: NOT DETECTED
Cyclospora cayetanensis: NOT DETECTED
ENTAMOEBA HISTOLYTICA: NOT DETECTED
ENTEROAGGREGATIVE E COLI (EAEC): NOT DETECTED
Enteropathogenic E coli (EPEC): DETECTED — AB
Enterotoxigenic E coli (ETEC): NOT DETECTED
GIARDIA LAMBLIA: NOT DETECTED
Norovirus GI/GII: NOT DETECTED
Plesimonas shigelloides: NOT DETECTED
Rotavirus A: NOT DETECTED
SALMONELLA SPECIES: NOT DETECTED
Sapovirus (I, II, IV, and V): NOT DETECTED
Shiga like toxin producing E coli (STEC): NOT DETECTED
Shigella/Enteroinvasive E coli (EIEC): NOT DETECTED
VIBRIO CHOLERAE: NOT DETECTED
Vibrio species: NOT DETECTED
YERSINIA ENTEROCOLITICA: NOT DETECTED

## 2017-11-30 LAB — GLUCOSE, CAPILLARY
Glucose-Capillary: 231 mg/dL — ABNORMAL HIGH (ref 65–99)
Glucose-Capillary: 192 mg/dL — ABNORMAL HIGH (ref 65–99)

## 2017-11-30 LAB — C DIFFICILE QUICK SCREEN W PCR REFLEX
C DIFFICLE (CDIFF) ANTIGEN: NEGATIVE
C Diff interpretation: NOT DETECTED
C Diff toxin: NEGATIVE

## 2017-11-30 MED ORDER — TACROLIMUS 1 MG PO CAPS
2.5000 mg | ORAL_CAPSULE | Freq: Every day | ORAL | Status: DC
  Administered 2017-11-30 – 2017-12-05 (×6): 2.5 mg via ORAL
  Filled 2017-11-30 (×7): qty 1

## 2017-11-30 MED ORDER — TACROLIMUS 1 MG PO CAPS
3.0000 mg | ORAL_CAPSULE | Freq: Every day | ORAL | Status: DC
  Administered 2017-12-01 – 2017-12-06 (×6): 3 mg via ORAL
  Filled 2017-11-30 (×7): qty 3

## 2017-11-30 MED ORDER — INSULIN ASPART 100 UNIT/ML ~~LOC~~ SOLN
0.0000 [IU] | Freq: Three times a day (TID) | SUBCUTANEOUS | Status: DC
  Administered 2017-11-30: 8 [IU] via SUBCUTANEOUS
  Administered 2017-12-01 (×2): 3 [IU] via SUBCUTANEOUS
  Administered 2017-12-01: 8 [IU] via SUBCUTANEOUS
  Administered 2017-12-02: 3 [IU] via SUBCUTANEOUS
  Administered 2017-12-02: 2 [IU] via SUBCUTANEOUS
  Administered 2017-12-02: 8 [IU] via SUBCUTANEOUS
  Administered 2017-12-03: 3 [IU] via SUBCUTANEOUS
  Administered 2017-12-03 – 2017-12-04 (×2): 2 [IU] via SUBCUTANEOUS
  Administered 2017-12-04 – 2017-12-05 (×2): 3 [IU] via SUBCUTANEOUS
  Administered 2017-12-06 (×3): 2 [IU] via SUBCUTANEOUS
  Administered 2017-12-07: 1 [IU] via SUBCUTANEOUS
  Filled 2017-11-30: qty 1

## 2017-11-30 MED ORDER — INSULIN GLARGINE 100 UNIT/ML ~~LOC~~ SOLN
5.0000 [IU] | Freq: Every day | SUBCUTANEOUS | Status: DC
  Filled 2017-11-30: qty 0.05

## 2017-11-30 MED ORDER — INSULIN ASPART 100 UNIT/ML ~~LOC~~ SOLN
0.0000 [IU] | Freq: Every day | SUBCUTANEOUS | Status: DC
  Administered 2017-12-01: 2 [IU] via SUBCUTANEOUS

## 2017-11-30 MED ORDER — INSULIN GLARGINE 100 UNIT/ML ~~LOC~~ SOLN
5.0000 [IU] | Freq: Every day | SUBCUTANEOUS | Status: DC
  Administered 2017-11-30 – 2017-12-06 (×7): 5 [IU] via SUBCUTANEOUS
  Filled 2017-11-30 (×9): qty 0.05

## 2017-11-30 NOTE — Progress Notes (Signed)
Inpatient Diabetes Program Recommendations  AACE/ADA: New Consensus Statement on Inpatient Glycemic Control (2015)  Target Ranges:  Prepandial:   less than 140 mg/dL      Peak postprandial:   less than 180 mg/dL (1-2 hours)      Critically ill patients:  140 - 180 mg/dL   Lab Results  Component Value Date   GLUCAP 166 (H) 11/30/2017   HGBA1C 15.0 (H) 02/08/2017    Review of Glycemic Control  Diabetes history: DM 2 Outpatient Diabetes medications: Tresiba 50 units, Novolog 0-20 units tid Current orders for Inpatient glycemic control: IV insulin  Inpatient Diabetes Program Recommendations:    Glucose 555 mg/dl on admission. Based on insulin gtt rates, consider Lantus 10 units, Novolog Sensitive Correction 0-9 units tid.  Thanks,  Tama Headings RN, MSN, BC-ADM, [facility] Inpatient Diabetes Coordinator Team Pager [phone] (8a-5p)

## 2017-11-30 NOTE — Consult Note (Signed)
[facility] Consult Note     Date: 11/30/2017                  Patient Name:  [name]  MRN: [id number]  DOB: 03-Apr-1971  Age / Sex: 47 y.o., male         PCP: Patient, No Pcp Per                 Service Requesting Consult: [facility]                 Reason for Consult:  Acute kidney injury            Chief Complaint: Diarrhea HPI: Patient is a 47 year old male renal transplant recipient presenting with dehydration secondary to 2 weeks of a diarrheal illness.  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  Patient presented to [facility] ED 4/17 due to ongoing diarrhea and subsequent weakness.  Labs upon admission significant for AKI with creatinine 7.41 (baseline 2-3), BUN 79, and electrolytes WNL.  Patient has not yet produced urine since admission but endorses last void the morning of 4/16.  Nephrology was consulted due to concern for renal transplant rejection.  Past Medical History:  Diagnosis Date  . Anemia   . CHF (congestive heart failure) ([place])   . 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 ESRD (end stage renal disease) on dialysis ([place])    1.Initiated HD via right per cate 12/30/2008 (MWF schedule at [facility]) 2.Left upper extremity A-V fistula by Dr Oneida Alar 12/30/2008 3.Aemia: Received Venofer 12/30/08 Ferritin 179 % sat 12 12/23/08, 4. Secondary Hyperparathyroidism- PTH 188, P 5.4, Ca 9.2 12/2008  . Hemodialysis-associated hypotension    coreg d/c'd  . History of blood transfusion   . Hypertension    resloved after initating HD, now with post HD hypotension  . Kidney transplant as cause of abnormal reaction or later complication 2376   [facility]  . Non-ischemic cardiomyopathy ([place])     EF 20-25% 12/23/2008>>EF 40-45% 03/2009>>EF ??? 7/?/2011  . Psoriasis    scalp    Past Surgical History:  Procedure Laterality Date  . AV FISTULA PLACEMENT     left  . AV FISTULA PLACEMENT  09/12/2012   Procedure: INSERTION OF ARTERIOVENOUS (AV) GORE-TEX GRAFT ARM;  Surgeon: Elam Dutch, MD;  Location: [place];  Service: Vascular;  Laterality: Left;  KIDNEY TRANSPLANT FAILED  . CATARACT EXTRACTION, BILATERAL    . EYE SURGERY    . INSERTION OF DIALYSIS CATHETER  07/14/2012   Procedure: INSERTION OF DIALYSIS CATHETER;  Surgeon: Mal Misty, MD;  Location: [place];  Service: Vascular;  Laterality: Right;  Ultrasound Guided Insertion of Internal Jugular Dialysis Catheter  . KIDNEY TRANSPLANT  2013   did not work  . NEPHRECTOMY TRANSPLANTED ORGAN  08/11/12  . PHOTOCOAGULATION     for diabetic retinopathy 08/1999  . SHUNTOGRAM N/A 07/11/2012   Procedure: Earney Mallet;  Surgeon: Serafina Mitchell, MD;  Location: [facility] C[facility] LAB;  Service: Cardiovascular;  Laterality: N/A;    Family History  Problem Relation Age of Onset  . Diabetes Father   . Kidney disease Father   . Other Father        amputation  . Hypertension Mother   .  Hypertension Brother   . Bleeding Disorder Brother   . CAD Neg Hx   . Sudden Cardiac Death Neg Hx   . Congestive Heart Failure Neg Hx   . Colon cancer Neg Hx   . Rectal cancer Neg Hx   . Esophageal cancer Neg Hx   . Liver cancer Neg Hx    Social History:  reports that he has never smoked. He has never used smokeless tobacco. He reports that he does not drink alcohol or use drugs.  Allergies:  Allergies  Allergen Reactions  . Lactose Intolerance (Gi) Diarrhea    No whole milk!!  . Other Other (See Comments)    Soy milk burns his tongue  . Pollen Extract Other (See Comments)    Itching, watery eyes and sneezing.  . Tape Rash    PLEASE USE COBAN WRAP OR PAPER TAPE      (Not in a hospital admission)  Results for orders placed or performed during the  hospital encounter of 11/29/17 (from the past 48 hour(s))  Lipase, blood     Status: None   Collection Time: 11/29/17  6:55 PM  Result Value Ref Range   Lipase 39 11 - 51 U/L    Comment: Performed at [facility], [address], [place], [place] [address]  Comprehensive metabolic panel     Status: Abnormal   Collection Time: 11/29/17  6:55 PM  Result Value Ref Range   Sodium 128 (L) 135 - 145 mmol/L   Potassium 5.2 (H) 3.5 - 5.1 mmol/L   Chloride 98 (L) 101 - 111 mmol/L   CO2 16 (L) 22 - 32 mmol/L   Glucose, Bld 555 (HH) 65 - 99 mg/dL    Comment: CRITICAL RESULT CALLED TO, READ BACK BY AND VERIFIED WITH: C CLEMMER,RN 1947 11/29/17 D BRADLEY    BUN 84 (H) 6 - 20 mg/dL   Creatinine, Ser 8.14 (H) 0.61 - 1.24 mg/dL   Calcium 10.5 (H) 8.9 - 10.3 mg/dL   Total Protein 6.9 6.5 - 8.1 g/dL   Albumin 4.3 3.5 - 5.0 g/dL   AST 12 (L) 15 - 41 U/L   ALT 12 (L) 17 - 63 U/L   Alkaline Phosphatase 94 38 - 126 U/L   Total Bilirubin 0.6 0.3 - 1.2 mg/dL   GFR calc non Af Amer 7 (L) >60 mL/min   GFR calc Af Amer 8 (L) >60 mL/min    Comment: (NOTE) The eGFR has been calculated using the CKD EPI equation. This calculation has not been validated in all clinical situations. eGFR's persistently <60 mL/min signify possible Chronic Kidney Disease.    Anion gap 14 5 - 15    Comment: Performed at [facility] [address] [place], [place] [address]  CBC with Differential     Status: Abnormal   Collection Time: 11/29/17  6:55 PM  Result Value Ref Range   WBC 6.4 4.0 - 10.5 K/uL   RBC 3.10 (L) 4.22 - 5.81 MIL/uL   Hemoglobin 8.1 (L) 13.0 - 17.0 g/dL   HCT 25.3 (L) 39.0 - 52.0 %   MCV 81.6 78.0 - 100.0 fL   MCH 26.1 26.0 - 34.0 pg   MCHC 32.0 30.0 - 36.0 g/dL   RDW 15.6 (H) 11.5 - 15.5 %   Platelets 117 (L) 150 - 400 K/uL    Comment: SPECIMEN CHECKED FOR CLOTS REPEATED TO VERIFY PLATELET COUNT CONFIRMED BY SMEAR    Neutrophils Relative % 62 %  Neutro Abs 4.0 1.7 - 7.7 K/uL    Lymphocytes Relative 21 %   Lymphs Abs 1.4 0.7 - 4.0 K/uL   Monocytes Relative 11 %   Monocytes Absolute 0.7 0.1 - 1.0 K/uL   Eosinophils Relative 6 %   Eosinophils Absolute 0.4 0.0 - 0.7 K/uL   Basophils Relative 0 %   Basophils Absolute 0.0 0.0 - 0.1 K/uL    Comment: Performed at [facility] [address] [place], [place] [address]  CBG monitoring, ED     Status: Abnormal   Collection Time: 11/29/17  9:49 PM  Result Value Ref Range   Glucose-Capillary 481 (H) 65 - 99 mg/dL  CBG monitoring, ED     Status: Abnormal   Collection Time: 11/29/17 10:45 PM  Result Value Ref Range   Glucose-Capillary 440 (H) 65 - 99 mg/dL  Basic metabolic panel     Status: Abnormal   Collection Time: 11/29/17 11:10 PM  Result Value Ref Range   Sodium 131 (L) 135 - 145 mmol/L   Potassium 4.3 3.5 - 5.1 mmol/L   Chloride 104 101 - 111 mmol/L   CO2 14 (L) 22 - 32 mmol/L   Glucose, Bld 308 (H) 65 - 99 mg/dL   BUN 81 (H) 6 - 20 mg/dL   Creatinine, Ser 7.77 (H) 0.61 - 1.24 mg/dL   Calcium 9.8 8.9 - 10.3 mg/dL   GFR calc non Af Amer 7 (L) >60 mL/min   GFR calc Af Amer 8 (L) >60 mL/min    Comment: (NOTE) The eGFR has been calculated using the CKD EPI equation. This calculation has not been validated in all clinical situations. eGFR's persistently <60 mL/min signify possible Chronic Kidney Disease.    Anion gap 13 5 - 15    Comment: Performed at [facility] [address], [place], [place] [address]  Lactic acid, plasma     Status: Abnormal   Collection Time: 11/29/17 11:34 PM  Result Value Ref Range   Lactic Acid, Venous 2.0 (HH) 0.5 - 1.9 mmol/L    Comment: CRITICAL RESULT CALLED TO, READ BACK BY AND VERIFIED WITH: Isaiah Blakes [facility] 11/30/17 0040 WAYK Performed at [facility], [place] [address], [place], [place] [address]   Beta-hydroxybutyric acid     Status: None   Collection Time: 11/29/17 11:34 PM  Result Value Ref Range   Beta-Hydroxybutyric Acid 0.15 0.05 - 0.27 mmol/L     Comment: Performed at [facility] [address], [place], [place] [address]  CBG monitoring, ED     Status: Abnormal   Collection Time: 11/29/17 11:50 PM  Result Value Ref Range   Glucose-Capillary 359 (H) 65 - 99 mg/dL  CBG monitoring, ED     Status: Abnormal   Collection Time: 11/30/17  1:01 AM  Result Value Ref Range   Glucose-Capillary 255 (H) 65 - 99 mg/dL  CBG monitoring, ED     Status: Abnormal   Collection Time: 11/30/17  2:10 AM  Result Value Ref Range   Glucose-Capillary 188 (H) 65 - 99 mg/dL  Lactic acid, plasma     Status: None   Collection Time: 11/30/17  2:57 AM  Result Value Ref Range   Lactic Acid, Venous 1.2 0.5 - 1.9 mmol/L    Comment: Performed at [facility], [place] [address] [place], [place] [address]  CBG monitoring, ED     Status: Abnormal   Collection Time: 11/30/17  3:23 AM  Result Value Ref Range   Glucose-Capillary 149 (H) 65 - 99 mg/dL  Basic metabolic panel     Status: Abnormal   Collection Time: 11/30/17  3:39 AM  Result Value Ref Range   Sodium 135 135 - 145 mmol/L   Potassium 3.9 3.5 - 5.1 mmol/L   Chloride 105 101 - 111 mmol/L   CO2 16 (L) 22 - 32 mmol/L   Glucose, Bld 169 (H) 65 - 99 mg/dL   BUN 79 (H) 6 - 20 mg/dL   Creatinine, Ser 7.41 (H) 0.61 - 1.24 mg/dL   Calcium 9.8 8.9 - 10.3 mg/dL   GFR calc non Af Amer 8 (L) >60 mL/min   GFR calc Af Amer 9 (L) >60 mL/min    Comment: (NOTE) The eGFR has been calculated using the CKD EPI equation. This calculation has not been validated in all clinical situations. eGFR's persistently <60 mL/min signify possible Chronic Kidney Disease.    Anion gap 14 5 - 15    Comment: Performed at [facility] [address], [place], [place] [address]  CBG monitoring, ED     Status: Abnormal   Collection Time: 11/30/17  4:22 AM  Result Value Ref Range   Glucose-Capillary 164 (H) 65 - 99 mg/dL  CBG monitoring, ED     Status: Abnormal   Collection Time: 11/30/17  5:34 AM  Result Value Ref  Range   Glucose-Capillary 118 (H) 65 - 99 mg/dL  CBG monitoring, ED     Status: Abnormal   Collection Time: 11/30/17  6:31 AM  Result Value Ref Range   Glucose-Capillary 143 (H) 65 - 99 mg/dL  Basic metabolic panel     Status: Abnormal   Collection Time: 11/30/17  7:10 AM  Result Value Ref Range   Sodium 134 (L) 135 - 145 mmol/L   Potassium 3.6 3.5 - 5.1 mmol/L   Chloride 104 101 - 111 mmol/L   CO2 19 (L) 22 - 32 mmol/L   Glucose, Bld 143 (H) 65 - 99 mg/dL   BUN 75 (H) 6 - 20 mg/dL   Creatinine, Ser 7.27 (H) 0.61 - 1.24 mg/dL   Calcium 9.6 8.9 - 10.3 mg/dL   GFR calc non Af Amer 8 (L) >60 mL/min   GFR calc Af Amer 9 (L) >60 mL/min    Comment: (NOTE) The eGFR has been calculated using the CKD EPI equation. This calculation has not been validated in all clinical situations. eGFR's persistently <60 mL/min signify possible Chronic Kidney Disease.    Anion gap 11 5 - 15    Comment: Performed at [facility] [address], [place], [place] [address]  CBG monitoring, ED     Status: Abnormal   Collection Time: 11/30/17  7:38 AM  Result Value Ref Range   Glucose-Capillary 143 (H) 65 - 99 mg/dL  CBG monitoring, ED     Status: Abnormal   Collection Time: 11/30/17  8:47 AM  Result Value Ref Range   Glucose-Capillary 135 (H) 65 - 99 mg/dL  CBG monitoring, ED     Status: Abnormal   Collection Time: 11/30/17 10:24 AM  Result Value Ref Range   Glucose-Capillary 166 (H) 65 - 99 mg/dL   No results found.  Review of Systems  Constitutional: Positive for malaise/fatigue. Negative for chills, fever and weight loss.  Respiratory: Negative for cough and shortness of breath.   Cardiovascular: Negative for chest pain and leg swelling.  Gastrointestinal: Positive for diarrhea and  nausea. Negative for abdominal pain, blood in stool and vomiting.  Genitourinary: Negative for dysuria, flank pain and hematuria.  Musculoskeletal: Negative for myalgias and neck pain.  Neurological: Negative  for focal weakness and headaches.    Blood pressure 116/69, pulse 89, temperature 98.1 F (36.7 C), temperature source Oral, resp. rate 18, SpO2 98 %. Physical Exam  Constitutional: He is oriented to person, place, and time. He appears well-developed.  emaciated, chronically ill-appearing male  HENT:  Head: Normocephalic and atraumatic.  Mouth/Throat: Oropharynx is clear and moist.  Eyes: Pupils are equal, round, and reactive to light. EOM are normal.  Neck: Normal range of motion. Neck supple.  Cardiovascular: Normal rate and regular rhythm. Exam reveals no gallop and no friction rub.  No murmur heard. Respiratory: Effort normal and breath sounds normal. No respiratory distress. He has no wheezes. He has no rales.  GI: Soft. Bowel sounds are normal. He exhibits no distension. There is no tenderness.  Neurological: He is alert and oriented to person, place, and time.  Skin: Skin is warm and dry.     Assessment/Plan 1.  Acute kidney injury with ESRD s/p kidney transplant: Creatinine elevated 7.41 from baseline 2-3.  Has been anuric since presentation to ED though patient endorses some urine yesterday.  Concerning for graft rejection though patient does have acute diarrheal illness which likely led to dehydrated state.  I do not see an indication for dialysis at present.  We will need to decrease Prograf and check level while continuing Myfortic.  Foley in place but patient is to yet to void.  We will continue aggressive fluid resuscitation and monitor in hopes that kidney transplant has not failed. 2.  Non-anioin gap metabolic acidosis: AG 11 on presentation.  Likely from diarrheal illness.  Continue bicarb drip. 3.  Diarrhea without vomiting: Pending workup.  We will continue aggressive fluid management. 4.  Diabetes: CBGs well controlled with insulin.  Per primary. 5.  Hypertension: Remains normotensive without tachycardia. 6.  Hyponatremia: Mild with sodium 134.  Will  monitor.    Harriet Butte, [place], PGY-2

## 2017-11-30 NOTE — ED Notes (Signed)
Breakfast tray ordered. Orders present for meal coverage.

## 2017-11-30 NOTE — Progress Notes (Signed)
CRITICAL VALUE ALERT  Critical Value:  GI PCR + for Enteropathogenic E coli   Date & Time Notied:  11/30/2017  Provider Notified: Tylene Fantasia, NP   Orders Received/Actions taken:  Awaiting response

## 2017-11-30 NOTE — Progress Notes (Signed)
Patient seen and evaluated earlier the same by my associate. Please refer to H&P for details regarding assessment and plan.  47 y.o. male with medical history significant of ESRD s/p renal transplant, HTN, poorly controlled DM2.  Patient presents to the ED with diarrhea.  Diarrhea ongoing after the past 12 days  Of note patient serum creatinine is elevated above baseline and patient has history of renal transplant. Nephrology is currently on board and managing.  GI panel has been ordered to assess stool for any organisms as causative factors.  VSS   Will reassess next am  Velvet Bathe, MD

## 2017-11-30 NOTE — H&P (Signed)
History and Physical    [name] VHQ:[id number] DOB: 1971/06/29 DOA: 11/29/2017  PCP: Patient, No Pcp Per  Patient coming from: Home  I have personally briefly reviewed patient's old medical records in [place]  Chief Complaint: Diarrhea  HPI: [name] is a 47 y.o. male with medical history significant of ESRD s/p renal transplant, HTN, poorly controlled DM2.  Patient presents to the ED with diarrhea.  Diarrhea ongoing after the past 12 days.  Intermittent nausea as well.  Baseline creat 2-3 but had been 3.3 on March 1st which had sparked some concern among his transplant team (see their note for details in care everywhere).  Regardless patient developed diarrhea x12 days ago after a [place] [employer].  At least 6 episodes daily of NB diarrhea.  Associated nausea and intermittent RLQ abd pain that resolves with BMs.  No recent ABx, no fevers, does have chills.   ED Course: Creat 8.x!  BUN 80.  Na 128, K 5.2.  Bicarb 16.  Ca 10.5.  Got 1L bolus.   Review of Systems: As per HPI otherwise 10 point review of systems negative.   Past Medical History:  Diagnosis Date  . Anemia   . CHF (congestive heart failure) ([place])   . Diabetes mellitus 12/2008   A1C 8.3 12/23/08, 24hr urine protein 2793 mg/day, SPEP neg, proliferative BDR s/p photocoagulation 08/2009 done at [facility] and f/u by DR Groat  . ESRD (end stage renal disease) on dialysis ([place])    1.Initiated HD via right per cate 12/30/2008 (MWF schedule at [facility]) 2.Left upper extremity A-V fistula by Dr Oneida Alar 12/30/2008 3.Aemia: Received Venofer 12/30/08 Ferritin 179 % sat 12 12/23/08, 4. Secondary Hyperparathyroidism- PTH 188, P 5.4, Ca 9.2 12/2008  . Hemodialysis-associated hypotension    coreg d/c'd  . History of blood transfusion   . Hypertension    resloved after initating HD, now with post HD hypotension  . Kidney transplant as cause of abnormal reaction or later complication 4132   [facility]    . Non-ischemic cardiomyopathy ([place])    EF 20-25% 12/23/2008>>EF 40-45% 03/2009>>EF ??? 7/?/2011  . Psoriasis    scalp    Past Surgical History:  Procedure Laterality Date  . AV FISTULA PLACEMENT     left  . AV FISTULA PLACEMENT  09/12/2012   Procedure: INSERTION OF ARTERIOVENOUS (AV) GORE-TEX GRAFT ARM;  Surgeon: Elam Dutch, MD;  Location: [facility];  Service: Vascular;  Laterality: Left;  KIDNEY TRANSPLANT FAILED  . CATARACT EXTRACTION, BILATERAL    . EYE SURGERY    . INSERTION OF DIALYSIS CATHETER  07/14/2012   Procedure: INSERTION OF DIALYSIS CATHETER;  Surgeon: Mal Misty, MD;  Location: [facility];  Service: Vascular;  Laterality: Right;  Ultrasound Guided Insertion of Internal Jugular Dialysis Catheter  . KIDNEY TRANSPLANT  2013   did not work  . NEPHRECTOMY TRANSPLANTED ORGAN  08/11/12  . PHOTOCOAGULATION     for diabetic retinopathy 08/1999  . SHUNTOGRAM N/A 07/11/2012   Procedure: Earney Mallet;  Surgeon: Serafina Mitchell, MD;  Location: [facility] CATH LAB;  Service: Cardiovascular;  Laterality: N/A;     reports that he has never smoked. He has never used smokeless tobacco. He reports that he does not drink alcohol or use drugs.  Allergies  Allergen Reactions  . Lactose Intolerance (Gi) Diarrhea    No whole milk!!  . Other Other (See Comments)    Soy milk burns his tongue  .  Pollen Extract Other (See Comments)    Itching, watery eyes and sneezing.  . Tape Rash    PLEASE USE COBAN WRAP OR PAPER TAPE     Family History  Problem Relation Age of Onset  . Diabetes Father   . Kidney disease Father   . Other Father        amputation  . Hypertension Mother   . Hypertension Brother   . Bleeding Disorder Brother   . CAD Neg Hx   . Sudden Cardiac Death Neg Hx   . Congestive Heart Failure Neg Hx   . Colon cancer Neg Hx   . Rectal cancer Neg Hx   . Esophageal cancer Neg Hx   . Liver cancer Neg Hx      Prior to Admission medications   Medication Sig Start Date End Date  Taking? Authorizing Provider  acetaminophen (TYLENOL) 500 MG tablet Take 1,000 mg by mouth every 6 (six) hours as needed for mild pain.   Yes [provider]  amoxicillin (AMOXIL) 500 MG tablet Take 2,000 mg by mouth See admin instructions. ONE HOUR PRIOR TO DENTAL APPOINTMENT 03/09/16  Yes [provider]  aspirin EC 81 MG tablet Take 81 mg by mouth daily.   Yes [provider]  bismuth subsalicylate (PEPTO BISMOL) 262 MG/15ML suspension Take 30 mLs by mouth every 6 (six) hours as needed for diarrhea or loose stools.   Yes [provider]  Blood Glucose Monitoring Suppl (ACCU-CHEK NANO SMARTVIEW) W/DEVICE KIT Use as instructed 04/24/14  Yes Kelby Aline, MD  calcitRIOL (ROCALTROL) 0.5 MCG capsule Take 1 capsule by mouth daily.  06/03/15  Yes [provider]  calcium carbonate (OS-CAL - DOSED IN MG OF ELEMENTAL CALCIUM) 1250 MG tablet Take 1 tablet by mouth 3 (three) times daily with meals.   Yes [provider]  carvedilol (COREG) 12.5 MG tablet Take 12.5 mg by mouth 2 (two) times daily. 03/07/14  Yes [provider]  dapsone 25 MG tablet Take 50 mg by mouth daily.   Yes [provider]  folic acid (FOLVITE) 1 MG tablet Take 1 mg by mouth daily.  06/24/15  Yes [provider]  glucose blood (FREESTYLE LITE) test strip Check blood sugar 4 times a day before meals and bed. diag code E11.9. Insulin dependent 12/25/14  Yes Annia Belt, MD  HYDROcodone-acetaminophen (NORCO/VICODIN) 5-325 MG tablet Take 1 tablet by mouth every 6 (six) hours as needed for moderate pain.   Yes [provider]  insulin aspart (NOVOLOG) 100 UNIT/ML FlexPen Inject 12 units with breakfast, 12 units with lunch, 12 units with supper Patient taking differently: Inject 0-20 Units into the skin 3 (three) times daily before meals. PER SLIDING SCALE 01/29/15  Yes Kelby Aline, MD  Insulin Pen Needle (PEN NEEDLES) 31G X 8 MM MISC Use 4  times daily to inject insulin into the skin. diag codeE11.9. Insulin dependent 01/29/15  Yes Bartholomew Crews, MD  ketoconazole (NIZORAL) 2 % shampoo Apply topically 2 (two) times a week. Wash hair and beard.  Leave shampoo on skin for 5 mins before washing off. 04/24/14  Yes Kelby Aline, MD  Lancets (FREESTYLE) lancets Check blood sugar 4 times a day before meals and bed. diag code E11.9. Insulin dependent 12/31/14  Yes Sid Falcon, MD  loperamide (IMODIUM A-D) 2 MG tablet Take 2 mg by mouth 4 (four) times daily as needed for diarrhea or loose stools.   Yes [provider]  magnesium oxide (MAG-OX) 400 MG tablet Take 400 mg by mouth daily.  07/01/15  Yes [provider]  mycophenolate (MYFORTIC) 180 MG EC tablet Take 720 mg by mouth 2 (two) times daily.   Yes [provider]  omeprazole (PRILOSEC) 20 MG capsule Take 20 mg by mouth daily.   Yes [provider]  sodium bicarbonate 650 MG tablet Take 1,300 mg by mouth 3 (three) times daily.  06/24/15  Yes [provider]  tacrolimus (PROGRAF) 1 MG capsule Take by mouth 2 (two) times daily. 6 mg in the morning and 5 mg in the evening   Yes [provider]  TRESIBA FLEXTOUCH 200 UNIT/ML SOPN Inject 66 Units into the skin at bedtime. Patient taking differently: Inject 50 Units into the skin at bedtime.  02/09/17  Yes Reyne Dumas, MD    Physical Exam: Vitals:   11/29/17 2245 11/29/17 2300 11/29/17 2315 11/30/17 0000  BP: (!) 118/96 122/69 124/78 114/72  Pulse: 98 100 95 79  Resp: 13 15 16 13   Temp:      TempSrc:      SpO2: 100% 98% 93% 100%    Constitutional: NAD, calm, comfortable Eyes: PERRL, lids and conjunctivae normal ENMT: Mucous membranes are moist. Posterior pharynx clear of any exudate or lesions.Normal dentition.  Neck: normal, supple, no masses, no thyromegaly Respiratory: clear to auscultation bilaterally, no wheezing, no crackles. Normal respiratory effort. No accessory  muscle use.  Cardiovascular: Regular rate and rhythm, no murmurs / rubs / gallops. No extremity edema. 2+ pedal pulses. No carotid bruits.  Abdomen: no tenderness, no masses palpated. No hepatosplenomegaly. Bowel sounds positive.  Musculoskeletal: no clubbing / cyanosis. No joint deformity upper and lower extremities. Good ROM, no contractures. Normal muscle tone.  Skin: no rashes, lesions, ulcers. No induration Neurologic: CN 2-12 grossly intact. Sensation intact, DTR normal. Strength 5/5 in all 4.  Psychiatric: Normal judgment and insight. Alert and oriented x 3. Normal mood.    Labs on Admission: I have personally reviewed following labs and imaging studies  CBC: Recent Labs  Lab 11/29/17 1855  WBC 6.4  NEUTROABS 4.0  HGB 8.1*  HCT 25.3*  MCV 81.6  PLT 008*   Basic Metabolic Panel: Recent Labs  Lab 11/29/17 1855  NA 128*  K 5.2*  CL 98*  CO2 16*  GLUCOSE 555*  BUN 84*  CREATININE 8.14*  CALCIUM 10.5*   GFR: CrCl cannot be calculated (Unknown ideal weight.). Liver Function Tests: Recent Labs  Lab 11/29/17 1855  AST 12*  ALT 12*  ALKPHOS 94  BILITOT 0.6  PROT 6.9  ALBUMIN 4.3   Recent Labs  Lab 11/29/17 1855  LIPASE 39   No results for input(s): AMMONIA in the last 168 hours. Coagulation Profile: No results for input(s): INR, PROTIME in the last 168 hours. Cardiac Enzymes: No results for input(s): CKTOTAL, CKMB, CKMBINDEX, TROPONINI in the last 168 hours. BNP (last 3 results) No results for input(s): PROBNP in the last 8760 hours. HbA1C: No results for input(s): HGBA1C in the last 72 hours. CBG: Recent Labs  Lab 11/29/17 2149 11/29/17 2245 11/29/17 2350  GLUCAP 481* 440* 359*   Lipid Profile: No results for input(s): CHOL, HDL, LDLCALC, TRIG, CHOLHDL, LDLDIRECT in the last 72 hours. Thyroid Function Tests: No results for input(s): TSH, T4TOTAL, FREET4, T3FREE, THYROIDAB in the last 72 hours. Anemia Panel: No results for input(s): VITAMINB12,  FOLATE, FERRITIN, TIBC, IRON, RETICCTPCT in the last 72 hours. Urine analysis:  Component Value Date/Time   COLORURINE YELLOW 12/23/2008 1845   APPEARANCEUR CLEAR 12/23/2008 1845   LABSPEC 1.015 12/23/2008 1845   PHURINE 5.0 12/23/2008 1845   GLUCOSEU NEGATIVE 12/23/2008 1845   HGBUR SMALL (A) 12/23/2008 1845   BILIRUBINUR NEGATIVE 12/23/2008 1845   KETONESUR NEGATIVE 12/23/2008 1845   PROTEINUR >300 (A) 12/23/2008 1845   UROBILINOGEN 0.2 12/23/2008 1845   NITRITE NEGATIVE 12/23/2008 1845   LEUKOCYTESUR NEGATIVE 12/23/2008 1845    Radiological Exams on Admission: No results found.  EKG: Independently reviewed.  Assessment/Plan Principal Problem:   Acute renal failure superimposed on stage 5 chronic kidney disease, not on chronic dialysis (HCC) Active Problems:   DM2 (diabetes mellitus, type 2) (HCC)   Essential hypertension, benign   Deceased-donor kidney transplant   Hyponatremia   Diarrhea   Metabolic acidosis, NAG, failure of bicarbonate regeneration    1. AKF of renal transplant - 1. EDP consulted Dr. Jonnie Finner with nephrology 1. No need to transfer to [place] right now 2. Try to rehydrate patient overnight 3. They will see in AM 2. IVF: 1. 1L NS bolus 2. 75 cc/hr of sodium bicarb 3. 50 cc/hr saline, switch to D5 Half when BGL below 250. 3. Monitor with Q4H BMPs overnight 2. DM2 - uncontrolled with hyperglycemia 1. Will start gluco stabilizer 3. NAG met acidosis - 1. Continue PO bicarb 2. Adding IV bicarb as above as well 4. Hypernatremia - due to dehydration, IVF plan and monitoring as above 5. Diarrhea - possibly the norovirus going around? 1. C.Diff quick scan 2. GI pathogen panel 6. HTN - 1. Holding coreg for now, BP 694-854 systolic 7. Transplant - 1. Continue anti-rejection meds 2. PharmD consult for dosing  DVT prophylaxis: Heparin [place] Code Status: Full Family Communication: Family at bedside Disposition Plan: TBD Consults called: Dr.  Jonnie Finner with nephrology consulted by EDP Admission status: Admit to inpatient   [place], [facility] Pager [phone]  If 7AM-7PM, please contact day team taking care of patient [email or web address] Password [email or web address]  11/30/2017, 12:07 AM

## 2017-11-30 NOTE — ED Notes (Addendum)
cbg- 143

## 2017-12-01 LAB — BASIC METABOLIC PANEL
Anion gap: 14 (ref 5–15)
BUN: 63 mg/dL — AB (ref 6–20)
CO2: 25 mmol/L (ref 22–32)
CREATININE: 6.25 mg/dL — AB (ref 0.61–1.24)
Calcium: 9.3 mg/dL (ref 8.9–10.3)
Chloride: 97 mmol/L — ABNORMAL LOW (ref 101–111)
GFR calc Af Amer: 11 mL/min — ABNORMAL LOW (ref >60)
GFR, EST NON AFRICAN AMERICAN: 10 mL/min — AB (ref >60)
Glucose, Bld: 187 mg/dL — ABNORMAL HIGH (ref 65–99)
Potassium: 3.5 mmol/L (ref 3.5–5.1)
SODIUM: 136 mmol/L (ref 135–145)

## 2017-12-01 LAB — MRSA PCR SCREENING: MRSA BY PCR: NEGATIVE

## 2017-12-01 LAB — URINALYSIS, ROUTINE W REFLEX MICROSCOPIC
BILIRUBIN URINE: NEGATIVE
Glucose, UA: >=500 mg/dL — AB
Hgb urine dipstick: NEGATIVE
KETONES UR: NEGATIVE mg/dL
Leukocytes, UA: NEGATIVE
Nitrite: NEGATIVE
PROTEIN: NEGATIVE mg/dL
Specific Gravity, Urine: 1.011 (ref 1.005–1.030)
pH: 5 (ref 5.0–8.0)

## 2017-12-01 LAB — GLUCOSE, CAPILLARY
Glucose-Capillary: 177 mg/dL — ABNORMAL HIGH (ref 65–99)
Glucose-Capillary: 171 mg/dL — ABNORMAL HIGH (ref 65–99)
Glucose-Capillary: 276 mg/dL — ABNORMAL HIGH (ref 65–99)
Glucose-Capillary: 222 mg/dL — ABNORMAL HIGH (ref 65–99)

## 2017-12-01 MED ORDER — CEFTRIAXONE SODIUM 1 G IJ SOLR
1.0000 g | INTRAMUSCULAR | Status: DC
Start: 2017-12-01 — End: 2017-12-03
  Administered 2017-12-01 – 2017-12-02 (×2): 1 g via INTRAVENOUS
  Filled 2017-12-01 (×3): qty 10

## 2017-12-01 NOTE — Progress Notes (Signed)
RN paged Tylene Fantasia, NP to make her aware patient's wife states patient has had 11 total liquid stools since arriving to floor this evening. Small streak of bright red blood noted in last stool.  RN awaiting response.  P.J. Linus Mako, RN

## 2017-12-01 NOTE — Progress Notes (Signed)
Call received from Tylene Fantasia, NP stating that patient can't have any Imodium or other antidiarrheal due to having Enteropathogenic E. Coli in stool.  She states diarrhea will have to run its course. P.J. Linus Mako, RN

## 2017-12-01 NOTE — Progress Notes (Signed)
  [facility] Progress Note    Assessment/ Plan:   1.  Acute kidney injury with ESRD s/p kidney transplant: Cr improving, 6.25 down from 7.41 on admission (BL 2-3).  Patient continues to produce urine.  Diarrhea found to be related to E coli infection.  Suspect this is not related to acute renal transplant rejection.  There are no indications for dialysis at present.  We will continue Prograf at decrease dose given diarrhea and Myfortic.  Continue fluid resuscitation.  2.  Non-anioin gap metabolic acidosis: AG 14.  Likely from diarrheal illness.  Continue bicarb drip.  3.  Enteropathogenic E coli: Source of diarrhea and state of dehydration causing AKI.  We will continue aggressive fluid management.  4.  Diabetes: CBGs well controlled with insulin.  Per primary.  5.  Hypertension: Remains normotensive with mild tachycardia today.  6.  Hyponatremia:  Resolved, Na 136.  Subjective:   Patient is feeling better today. Continues to have diarrhea. Has been able to urinate since his admission.   Objective:   BP 120/83 (BP Location: Right Arm)   Pulse (!) 102   Temp 98.6 F (37 C) (Oral)   Resp 13   SpO2 97%   Intake/Output Summary (Last 24 hours) at 12/01/2017 0747 Last data filed at 12/01/2017 0136 Gross per 24 hour  Intake 1000 ml  Output 600 ml  Net 400 ml   Weight change:   Physical Exam: General: emaciated male, NAD with non-toxic appearance HEENT: normocephalic, atraumatic, moist mucous membranes Cardiovascular: regular rate and rhythm without murmurs, rubs, or gallops Lungs: clear to auscultation bilaterally with normal work of breathing Abdomen: soft, non-tender, non-distended, normoactive bowel sounds GU: clear straw/yellow urine in foley bag Skin: warm, dry, no rashes or lesions, cap refill < 2 seconds Extremities: warm and well perfused, normal tone, no edema  Imaging: No results found.  Labs: BMET Recent Labs  Lab 11/29/17 1855 11/29/17 2310  11/30/17 0339 11/30/17 0710 11/30/17 1811  NA 128* 131* 135 134* 132*  K 5.2* 4.3 3.9 3.6 3.5  CL 98* 104 105 104 99*  CO2 16* 14* 16* 19* 20*  GLUCOSE 555* 308* 169* 143* 245*  BUN 84* 81* 79* 75* 67*  CREATININE 8.14* 7.77* 7.41* 7.27* 6.95*  CALCIUM 10.5* 9.8 9.8 9.6 9.6   CBC Recent Labs  Lab 11/29/17 1855  WBC 6.4  NEUTROABS 4.0  HGB 8.1*  HCT 25.3*  MCV 81.6  PLT 117*    Medications:    . aspirin EC  81 mg Oral Daily  . dapsone  50 mg Oral Daily  . folic acid  1 mg Oral Daily  . heparin  5,000 Units Subcutaneous Q8H  . insulin aspart  0-15 Units Subcutaneous TID WC  . insulin aspart  0-5 Units Subcutaneous QHS  . insulin glargine  5 Units Subcutaneous QHS  . mycophenolate  720 mg Oral BID  . sodium bicarbonate  1,300 mg Oral TID  . tacrolimus  2.5 mg Oral QHS  . tacrolimus  3 mg Oral Daily      Harriet Butte, DO, PGY-2

## 2017-12-01 NOTE — Progress Notes (Signed)
PROGRESS NOTE    [name]  KPT:[id number] DOB: 04-12-1971 DOA: 11/29/2017 PCP: Patient, No Pcp Per    Brief Narrative: 47 y.o.malewith medical history significant ofESRD s/p renal transplant, HTN, poorly controlled DM2. Patient presents to the ED with diarrhea. Diarrhea ongoing after the past 12 days  Pt diagnosed with enteropathogenic E coli after eating at [employer] (per patient)   Assessment & Plan:   Principal Problem:   Acute renal failure superimposed on stage 5 chronic kidney disease, not on chronic dialysis [facility]) - Nephrology on board and managing  Active Problems:   DM2 (diabetes mellitus, type 2) ([place]) - transitioned to SQ insulin with long acting and SSI. Continue at current dose for now - diabetic diet.   Diarrhea - secondary to E coli. Will start Rocephin - Most likely led to dehydration which cause principle problem    Essential hypertension, benign - stable BP     Deceased-donor kidney transplant - Nephro aware and managing.    Hyponatremia - Sodium WNL on last check  DVT prophylaxis: Heparin Code Status: Full Family Communication: none at bedside Disposition Plan: pending improvement in condition   Consultants:   Nephrology   Procedures: Dialysis   Antimicrobials: Rocephin   Subjective: Pt has no new complaints. Still having diarrhea  Objective: Vitals:   12/01/17 0050 12/01/17 0438 12/01/17 0439 12/01/17 0850  BP: 134/80  120/83 120/72  Pulse:   (!) 102 (!) 106  Resp: 16  13 17   Temp: 98.6 F (37 C) 98.6 F (37 C) 98.6 F (37 C)   TempSrc: Oral Oral Oral   SpO2: 100%  97% 96%    Intake/Output Summary (Last 24 hours) at 12/01/2017 1342 Last data filed at 12/01/2017 1100 Gross per 24 hour  Intake 1120 ml  Output 600 ml  Net 520 ml   There were no vitals filed for this visit.  Examination:  General exam: Appears calm and comfortable, in nad. Respiratory system: Clear to auscultation. Respiratory effort  normal. Cardiovascular system: S1 & S2 heard, RRR. No JVD, murmurs Gastrointestinal system: Abdomen is nondistended, soft and nontender. No organomegaly or masses felt. Normal bowel sounds heard. Central nervous system: Alert and oriented. No focal neurological deficits. Extremities: Symmetric 5 x 5 power. Skin: No rashes, lesions or ulcers, on limited exam. Psychiatry: Mood & affect appropriate.   Data Reviewed: I have personally reviewed following labs and imaging studies  CBC: Recent Labs  Lab 11/29/17 1855  WBC 6.4  NEUTROABS 4.0  HGB 8.1*  HCT 25.3*  MCV 81.6  PLT 170*   Basic Metabolic Panel: Recent Labs  Lab 11/29/17 2310 11/30/17 0339 11/30/17 0710 11/30/17 1811 12/01/17 0928  NA 131* 135 134* 132* 136  K 4.3 3.9 3.6 3.5 3.5  CL 104 105 104 99* 97*  CO2 14* 16* 19* 20* 25  GLUCOSE 308* 169* 143* 245* 187*  BUN 81* 79* 75* 67* 63*  CREATININE 7.77* 7.41* 7.27* 6.95* 6.25*  CALCIUM 9.8 9.8 9.6 9.6 9.3   GFR: CrCl cannot be calculated (Unknown ideal weight.). Liver Function Tests: Recent Labs  Lab 11/29/17 1855  AST 12*  ALT 12*  ALKPHOS 94  BILITOT 0.6  PROT 6.9  ALBUMIN 4.3   Recent Labs  Lab 11/29/17 1855  LIPASE 39   No results for input(s): AMMONIA in the last 168 hours. Coagulation Profile: No results for input(s): INR, PROTIME in the last 168 hours. Cardiac Enzymes: No results for input(s): CKTOTAL, CKMB, CKMBINDEX, TROPONINI in the last  168 hours. BNP (last 3 results) No results for input(s): PROBNP in the last 8760 hours. HbA1C: No results for input(s): HGBA1C in the last 72 hours. CBG: Recent Labs  Lab 11/30/17 1634 11/30/17 1826 11/30/17 2113 12/01/17 0745 12/01/17 1204  GLUCAP 257* 231* 192* 177* 171*   Lipid Profile: No results for input(s): CHOL, HDL, LDLCALC, TRIG, CHOLHDL, LDLDIRECT in the last 72 hours. Thyroid Function Tests: No results for input(s): TSH, T4TOTAL, FREET4, T3FREE, THYROIDAB in the last 72  hours. Anemia Panel: No results for input(s): VITAMINB12, FOLATE, FERRITIN, TIBC, IRON, RETICCTPCT in the last 72 hours. Sepsis Labs: Recent Labs  Lab 11/29/17 2334 11/30/17 0257  LATICACIDVEN 2.0* 1.2    Recent Results (from the past 240 hour(s))  Gastrointestinal Panel by PCR , Stool     Status: Abnormal   Collection Time: 11/30/17 10:29 AM  Result Value Ref Range Status   Campylobacter species NOT DETECTED NOT DETECTED Final   Plesimonas shigelloides NOT DETECTED NOT DETECTED Final   Salmonella species NOT DETECTED NOT DETECTED Final   Yersinia enterocolitica NOT DETECTED NOT DETECTED Final   Vibrio species NOT DETECTED NOT DETECTED Final   Vibrio cholerae NOT DETECTED NOT DETECTED Final   Enteroaggregative E coli (EAEC) NOT DETECTED NOT DETECTED Final   Enteropathogenic E coli (EPEC) DETECTED (A) NOT DETECTED Final    Comment: RESULT CALLED TO, READ BACK BY AND VERIFIED WITH: TJ SEXTON 11/30/17 1921 KLW    Enterotoxigenic E coli (ETEC) NOT DETECTED NOT DETECTED Final   Shiga like toxin producing E coli (STEC) NOT DETECTED NOT DETECTED Final   Shigella/Enteroinvasive E coli (EIEC) NOT DETECTED NOT DETECTED Final   Cryptosporidium NOT DETECTED NOT DETECTED Final   Cyclospora cayetanensis NOT DETECTED NOT DETECTED Final   Entamoeba histolytica NOT DETECTED NOT DETECTED Final   Giardia lamblia NOT DETECTED NOT DETECTED Final   Adenovirus F40/41 NOT DETECTED NOT DETECTED Final   Astrovirus NOT DETECTED NOT DETECTED Final   Norovirus GI/GII NOT DETECTED NOT DETECTED Final   Rotavirus A NOT DETECTED NOT DETECTED Final   Sapovirus (I, II, IV, and V) NOT DETECTED NOT DETECTED Final    Comment: Performed at [facility], [address] [place], [place] [address]  C difficile quick scan w PCR reflex     Status: None   Collection Time: 11/30/17 10:29 AM  Result Value Ref Range Status   C Diff antigen NEGATIVE NEGATIVE Final   C Diff toxin NEGATIVE NEGATIVE Final   C Diff  interpretation No C. difficile detected.  Final  MRSA PCR Screening     Status: None   Collection Time: 12/01/17  4:57 AM  Result Value Ref Range Status   MRSA by PCR NEGATIVE NEGATIVE Final    Comment:        The GeneXpert MRSA Assay (FDA approved for NASAL specimens only), is one component of a comprehensive MRSA colonization surveillance program. It is not intended to diagnose MRSA infection nor to guide or monitor treatment for MRSA infections. Performed at [facility], [place] [address], [place], [place] [address]      Radiology Studies: No results found.   Scheduled Meds: . aspirin EC  81 mg Oral Daily  . dapsone  50 mg Oral Daily  . folic acid  1 mg Oral Daily  . heparin  5,000 Units Subcutaneous Q8H  . insulin aspart  0-15 Units Subcutaneous TID WC  . insulin aspart  0-5 Units Subcutaneous QHS  . insulin  glargine  5 Units Subcutaneous QHS  . mycophenolate  720 mg Oral BID  . sodium bicarbonate  1,300 mg Oral TID  . tacrolimus  2.5 mg Oral QHS  . tacrolimus  3 mg Oral Daily   Continuous Infusions: . cefTRIAXone (ROCEPHIN)  IV    .  sodium bicarbonate (isotonic) infusion in sterile water 75 mL/hr at 12/01/17 0706     LOS: 2 days    Time spent: 36 min  Velvet Bathe, MD [facility] Pager [phone]  If 7PM-7AM, please contact night-coverage [email or web address] Password [email or web address] 12/01/2017, 1:42 PM

## 2017-12-02 LAB — BASIC METABOLIC PANEL
Anion gap: 15 (ref 5–15)
BUN: 47 mg/dL — AB (ref 6–20)
CHLORIDE: 93 mmol/L — AB (ref 101–111)
CO2: 31 mmol/L (ref 22–32)
CREATININE: 5.1 mg/dL — AB (ref 0.61–1.24)
Calcium: 9 mg/dL (ref 8.9–10.3)
GFR calc Af Amer: 14 mL/min — ABNORMAL LOW (ref >60)
GFR calc non Af Amer: 12 mL/min — ABNORMAL LOW (ref >60)
Glucose, Bld: 161 mg/dL — ABNORMAL HIGH (ref 65–99)
POTASSIUM: 3.1 mmol/L — AB (ref 3.5–5.1)
SODIUM: 139 mmol/L (ref 135–145)

## 2017-12-02 LAB — GLUCOSE, CAPILLARY
GLUCOSE-CAPILLARY: 150 mg/dL — AB (ref 65–99)
GLUCOSE-CAPILLARY: 254 mg/dL — AB (ref 65–99)
GLUCOSE-CAPILLARY: 120 mg/dL — AB (ref 65–99)
Glucose-Capillary: 151 mg/dL — ABNORMAL HIGH (ref 65–99)
Glucose-Capillary: 155 mg/dL — ABNORMAL HIGH (ref 65–99)

## 2017-12-02 LAB — TACROLIMUS LEVEL: Tacrolimus (FK506) - LabCorp: 12.9 ng/mL (ref 2.0–20.0)

## 2017-12-02 MED ORDER — CALCITRIOL 0.25 MCG PO CAPS
0.5000 ug | ORAL_CAPSULE | Freq: Every day | ORAL | Status: DC
  Administered 2017-12-02 – 2017-12-07 (×6): 0.5 ug via ORAL
  Filled 2017-12-02 (×6): qty 2

## 2017-12-02 NOTE — Progress Notes (Signed)
Inpatient Diabetes Program Recommendations  AACE/ADA: New Consensus Statement on Inpatient Glycemic Control (2015)  Target Ranges:  Prepandial:   less than 140 mg/dL      Peak postprandial:   less than 180 mg/dL (1-2 hours)      Critically ill patients:  140 - 180 mg/dL   Lab Results  Component Value Date   GLUCAP 150 (H) 12/02/2017   HGBA1C 15.0 (H) 02/08/2017    Review of Glycemic Control Results for [name], [name] (MRN [id number]) as of 12/02/2017 12:03  Ref. Range 12/01/2017 17:59 12/01/2017 21:52 12/02/2017 07:48 12/02/2017 09:35  Glucose-Capillary Latest Ref Range: 65 - 99 mg/dL 276 (H) 222 (H) 151 (H) 150 (H)   Diabetes history: Type 2 DM Outpatient Diabetes medications: Novolog 0-20 units SSI TIDAC, Tresiba 50 units QHS Current orders for Inpatient glycemic control: Novolog 0-15 units TID, Novolog 0-5 units QHS, Lantus 5 units QHS  Inpatient Diabetes Program Recommendations:    Last A1C was from 2018 was 15%. Consider repeating A1C?  Additionally, would recommend increasing Lantus to 8 units QD (per home regimen patient usually takes Lantus 50 units) and adjust as necessary. May need to consider adding meal coverage Novolog 3 units TID (assuming patient consumes >50%) if post prandials continue to exceed 180 mg/dL.  Thanks, Bronson Curb, MSN, RNC-OB Diabetes Coordinator [phone] (8a-5p)

## 2017-12-02 NOTE — Progress Notes (Signed)
Inpatient Diabetes Program   AACE/ADA: New Consensus Statement on Inpatient Glycemic Control (2015)  Target Ranges:  Prepandial:   less than 140 mg/dL      Peak postprandial:   less than 180 mg/dL (1-2 hours)      Critically ill patients:  140 - 180 mg/dL    Spoke with patient about home management for DM since last A1c level of 15% last year. Patient had recently ran out of test strips, lancets, and is close to running out of insulin pen needles.  Patient and wife learned they were dismissed from patient's PCP office and was in the room on the phone with someone from their insurance setting them up a new appointment with a PCP for next week on 12/08/2017 at 9 am.  At time of d/c patient will need refill on Accu-Chek Aviva strips, lancets and insulin pen needles (order [phone])  Thanks,  Tama Headings RN, MSN, BC-ADM, [facility] Inpatient Diabetes Coordinator Team Pager [phone] (8a-5p)

## 2017-12-02 NOTE — Progress Notes (Signed)
PROGRESS NOTE    [name]  ASN:[id number] DOB: 1971-03-02 DOA: 11/29/2017 PCP: Patient, No Pcp Per   Brief Narrative:   This is a 47 year old African-American male with history of ESRD status post renal transplant, hypertension, and poorly controlled type 2 diabetes who presented to the emergency department with diarrhea that is now diagnosed secondary to enteropathogenic E. coli after eating at [employer].  As a result, he has become dehydrated and has developed AK I on stage V CKD with nephrology following.  He has been started on Rocephin for his E. coli diarrhea with slow improvement noted.  Assessment & Plan:   Principal Problem:   Acute renal failure superimposed on stage 5 chronic kidney disease, not on chronic dialysis (HCC) Active Problems:   DM2 (diabetes mellitus, type 2) (HCC)   Essential hypertension, benign   Deceased-donor kidney transplant   Hyponatremia   Diarrhea   Metabolic acidosis, NAG, failure of bicarbonate regeneration   1. AK I on CKD stage V-not on dialysis with prior transplant.  This is noted to be secondary to dehydration from his diarrhea.  Appreciate further nephrology recommendations with continuation of IV fluid.  Creatinine appears to be downtrending with acidosis that appears to have resolved.  Further fluid management per their recommendations with repeat labs in a.m.  Baseline creatinine noted to be 2-3.  He is currently at 5.1. 2. Diarrhea secondary to enteropathogenic E. coli.  Continue Rocephin with slow improvement noted.  Continue current diet which appears to be well tolerated. 3. Essential hypertension.  Stable blood pressure readings currently noted. 4. Hyponatremia.  Resolved.  Continue to monitor.   DVT prophylaxis: Heparin changed to SCDs per patient request Code Status: Full Family Communication: Significant other at bedside Disposition Plan: Continue to follow improvement in diarrhea as well as renal function with ongoing IV infusion  per renal.   Consultants:   Nephrology  Procedures:   Dialysis  Antimicrobials:   Rocephin 4/18-present   Subjective: Patient seen and evaluated today with no new acute complaints or concerns. No acute concerns or events noted overnight.  He states that his diarrhea is still persistent, but appears to be slowly improving after antibiotic was started yesterday.  Objective: Vitals:   12/01/17 0439 12/01/17 0850 12/01/17 2008 12/02/17 0510  BP: 120/83 120/72 133/78 132/69  Pulse: (!) 102 (!) 106 (!) 107 (!) 102  Resp: 13 17 (!) 28 18  Temp: 98.6 F (37 C)   98.7 F (37.1 C)  TempSrc: Oral   Oral  SpO2: 97% 96% 100% 97%    Intake/Output Summary (Last 24 hours) at 12/02/2017 1023 Last data filed at 12/02/2017 0400 Gross per 24 hour  Intake 990 ml  Output 500 ml  Net 490 ml   There were no vitals filed for this visit.  Examination:  General exam: Appears calm and comfortable  Respiratory system: Clear to auscultation. Respiratory effort normal. Cardiovascular system: S1 & S2 heard, RRR. No JVD, murmurs, rubs, gallops or clicks. No pedal edema. Gastrointestinal system: Abdomen is nondistended, soft and nontender. No organomegaly or masses felt. Normal bowel sounds heard. Central nervous system: Alert and oriented. No focal neurological deficits. Extremities: Symmetric 5 x 5 power. Skin: No rashes, lesions or ulcers Psychiatry: Judgement and insight appear normal. Mood & affect appropriate.     Data Reviewed: I have personally reviewed following labs and imaging studies  CBC: Recent Labs  Lab 11/29/17 1855  WBC 6.4  NEUTROABS 4.0  HGB 8.1*  HCT 25.3*  MCV 81.6  PLT 096*   Basic Metabolic Panel: Recent Labs  Lab 11/30/17 0339 11/30/17 0710 11/30/17 1811 12/01/17 0928 12/02/17 0819  NA 135 134* 132* 136 139  K 3.9 3.6 3.5 3.5 3.1*  CL 105 104 99* 97* 93*  CO2 16* 19* 20* 25 31  GLUCOSE 169* 143* 245* 187* 161*  BUN 79* 75* 67* 63* 47*  CREATININE  7.41* 7.27* 6.95* 6.25* 5.10*  CALCIUM 9.8 9.6 9.6 9.3 9.0   GFR: CrCl cannot be calculated (Unknown ideal weight.). Liver Function Tests: Recent Labs  Lab 11/29/17 1855  AST 12*  ALT 12*  ALKPHOS 94  BILITOT 0.6  PROT 6.9  ALBUMIN 4.3   Recent Labs  Lab 11/29/17 1855  LIPASE 39   No results for input(s): AMMONIA in the last 168 hours. Coagulation Profile: No results for input(s): INR, PROTIME in the last 168 hours. Cardiac Enzymes: No results for input(s): CKTOTAL, CKMB, CKMBINDEX, TROPONINI in the last 168 hours. BNP (last 3 results) No results for input(s): PROBNP in the last 8760 hours. HbA1C: No results for input(s): HGBA1C in the last 72 hours. CBG: Recent Labs  Lab 12/01/17 1204 12/01/17 1759 12/01/17 2152 12/02/17 0748 12/02/17 0935  GLUCAP 171* 276* 222* 151* 150*   Lipid Profile: No results for input(s): CHOL, HDL, LDLCALC, TRIG, CHOLHDL, LDLDIRECT in the last 72 hours. Thyroid Function Tests: No results for input(s): TSH, T4TOTAL, FREET4, T3FREE, THYROIDAB in the last 72 hours. Anemia Panel: No results for input(s): VITAMINB12, FOLATE, FERRITIN, TIBC, IRON, RETICCTPCT in the last 72 hours. Sepsis Labs: Recent Labs  Lab 11/29/17 2334 11/30/17 0257  LATICACIDVEN 2.0* 1.2    Recent Results (from the past 240 hour(s))  Gastrointestinal Panel by PCR , Stool     Status: Abnormal   Collection Time: 11/30/17 10:29 AM  Result Value Ref Range Status   Campylobacter species NOT DETECTED NOT DETECTED Final   Plesimonas shigelloides NOT DETECTED NOT DETECTED Final   Salmonella species NOT DETECTED NOT DETECTED Final   Yersinia enterocolitica NOT DETECTED NOT DETECTED Final   Vibrio species NOT DETECTED NOT DETECTED Final   Vibrio cholerae NOT DETECTED NOT DETECTED Final   Enteroaggregative E coli (EAEC) NOT DETECTED NOT DETECTED Final   Enteropathogenic E coli (EPEC) DETECTED (A) NOT DETECTED Final    Comment: RESULT CALLED TO, READ BACK BY AND VERIFIED  WITH: TJ SEXTON 11/30/17 1921 KLW    Enterotoxigenic E coli (ETEC) NOT DETECTED NOT DETECTED Final   Shiga like toxin producing E coli (STEC) NOT DETECTED NOT DETECTED Final   Shigella/Enteroinvasive E coli (EIEC) NOT DETECTED NOT DETECTED Final   Cryptosporidium NOT DETECTED NOT DETECTED Final   Cyclospora cayetanensis NOT DETECTED NOT DETECTED Final   Entamoeba histolytica NOT DETECTED NOT DETECTED Final   Giardia lamblia NOT DETECTED NOT DETECTED Final   Adenovirus F40/41 NOT DETECTED NOT DETECTED Final   Astrovirus NOT DETECTED NOT DETECTED Final   Norovirus GI/GII NOT DETECTED NOT DETECTED Final   Rotavirus A NOT DETECTED NOT DETECTED Final   Sapovirus (I, II, IV, and V) NOT DETECTED NOT DETECTED Final    Comment: Performed at [facility], [address] [place], [place] [address]  C difficile quick scan w PCR reflex     Status: None   Collection Time: 11/30/17 10:29 AM  Result Value Ref Range Status   C Diff antigen NEGATIVE NEGATIVE Final   C Diff toxin NEGATIVE NEGATIVE Final   C Diff interpretation No C.  difficile detected.  Final  MRSA PCR Screening     Status: None   Collection Time: 12/01/17  4:57 AM  Result Value Ref Range Status   MRSA by PCR NEGATIVE NEGATIVE Final    Comment:        The GeneXpert MRSA Assay (FDA approved for NASAL specimens only), is one component of a comprehensive MRSA colonization surveillance program. It is not intended to diagnose MRSA infection nor to guide or monitor treatment for MRSA infections. Performed at [facility], [place] [address], [place], [place] [address]          Radiology Studies: No results found.      Scheduled Meds: . aspirin EC  81 mg Oral Daily  . dapsone  50 mg Oral Daily  . folic acid  1 mg Oral Daily  . insulin aspart  0-15 Units Subcutaneous TID WC  . insulin aspart  0-5 Units Subcutaneous QHS  . insulin glargine  5 Units Subcutaneous QHS  . mycophenolate  720 mg Oral BID  .  sodium bicarbonate  1,300 mg Oral TID  . tacrolimus  2.5 mg Oral QHS  . tacrolimus  3 mg Oral Daily   Continuous Infusions: . cefTRIAXone (ROCEPHIN)  IV Stopped (12/01/17 1543)  .  sodium bicarbonate (isotonic) infusion in sterile water 75 mL/hr at 12/01/17 1900     LOS: 3 days    Time spent: 30 minutes    Pratik Darleen Crocker, DO [facility] Pager [phone]  If 7PM-7AM, please contact night-coverage [email or web address] Password [facility] 12/02/2017, 10:23 AM

## 2017-12-02 NOTE — Progress Notes (Signed)
  [facility] Progress Note    Assessment/ Plan:   1.  Acute kidney injury with ESRD s/p kidney transplant: Cr improving, now at 5.1 (BL 2-3).  Patient continues to produce urine.  Diarrhea found to be related to E coli infection.  Suspect this is not related to acute renal transplant rejection.  There are no indications for dialysis at present.  We will continue Prograf at decrease dose given diarrhea and Myfortic.  Patient has been receiving home dose Prograf.  It is important that he receive a decreased dose of 3 mg daily and 2.5 mg at bedtime given his diarrhea.  Continue fluid resuscitation.  2.  Non-anioin gap metabolic acidosis: Likely from diarrheal illness.  Continue bicarb drip.  3.  Enteropathogenic E coli: Source of diarrhea and state of dehydration causing AKI.  On CTX. Per primary.  4.  Diabetes: CBGs well controlled with insulin.  Per primary.  5.  Hypertension: Remains normotensive with mild tachycardia today. Known E coli. infection.  6.  Hyponatremia:  Resolved. Will monitor.  Subjective:   Patient is doing well. No new complaints today.  Diarrhea continues to improve.  Patient states he took his home dose Prograf last night because pharmacy was taking too long to give him his medication at his scheduled time.    Objective:   BP 132/69 (BP Location: Right Arm)   Pulse (!) 102   Temp 98.7 F (37.1 C) (Oral)   Resp 18   SpO2 97%   Intake/Output Summary (Last 24 hours) at 12/02/2017 0759 Last data filed at 12/02/2017 0400 Gross per 24 hour  Intake 990 ml  Output 500 ml  Net 490 ml   Weight change:   Physical Exam: General: emaciated male, NAD with non-toxic appearance HEENT: normocephalic, atraumatic, moist mucous membranes Cardiovascular: regular rate and rhythm without murmurs, rubs, or gallops Lungs: clear to auscultation bilaterally with normal work of breathing Abdomen: soft, non-tender, non-distended, normoactive bowel sounds GU: clear  straw/yellow urine in foley bag Skin: warm, dry, no rashes or lesions, cap refill < 2 seconds Extremities: warm and well perfused, normal tone, no edema  Imaging: No results found.  Labs: BMET Recent Labs  Lab 11/29/17 1855 11/29/17 2310 11/30/17 0339 11/30/17 0710 11/30/17 1811 12/01/17 0928  NA 128* 131* 135 134* 132* 136  K 5.2* 4.3 3.9 3.6 3.5 3.5  CL 98* 104 105 104 99* 97*  CO2 16* 14* 16* 19* 20* 25  GLUCOSE 555* 308* 169* 143* 245* 187*  BUN 84* 81* 79* 75* 67* 63*  CREATININE 8.14* 7.77* 7.41* 7.27* 6.95* 6.25*  CALCIUM 10.5* 9.8 9.8 9.6 9.6 9.3   CBC Recent Labs  Lab 11/29/17 1855  WBC 6.4  NEUTROABS 4.0  HGB 8.1*  HCT 25.3*  MCV 81.6  PLT 117*    Medications:    . aspirin EC  81 mg Oral Daily  . dapsone  50 mg Oral Daily  . folic acid  1 mg Oral Daily  . heparin  5,000 Units Subcutaneous Q8H  . insulin aspart  0-15 Units Subcutaneous TID WC  . insulin aspart  0-5 Units Subcutaneous QHS  . insulin glargine  5 Units Subcutaneous QHS  . mycophenolate  720 mg Oral BID  . sodium bicarbonate  1,300 mg Oral TID  . tacrolimus  2.5 mg Oral QHS  . tacrolimus  3 mg Oral Daily      Harriet Butte, DO, PGY-2

## 2017-12-02 NOTE — Progress Notes (Signed)
Asked by MD to discuss with patient and family member about inpatient dosing of tacrolimus. Patient and family member now aware about the effect of diarrhea on tacrolimus levels and need to empirically decrease dose.   Family is concerned about delay in administration. Discussed with family member about delays in therapy, and that pharmacy is sending the mycophenolic acid in the daily cart and keeping the tacrolimus in the pyxis on the unit which should prevent delays in obtaining the medication. Family member expressed understanding.   Thank you for allowing pharmacy to be a part of this patient's care.   Cathyrn Deas A. Levada Dy, PharmD, [place] Pager: [phone]

## 2017-12-03 DIAGNOSIS — N185 Chronic kidney disease, stage 5: Secondary | ICD-10-CM

## 2017-12-03 DIAGNOSIS — N179 Acute kidney failure, unspecified: Principal | ICD-10-CM

## 2017-12-03 LAB — CBC
HCT: 20.1 % — ABNORMAL LOW (ref 39.0–52.0)
Hemoglobin: 6.1 g/dL — CL (ref 13.0–17.0)
MCH: 26 pg (ref 26.0–34.0)
MCHC: 30.3 g/dL (ref 30.0–36.0)
MCV: 85.5 fL (ref 78.0–100.0)
PLATELETS: 79 10*3/uL — AB (ref 150–400)
RBC: 2.35 MIL/uL — AB (ref 4.22–5.81)
RDW: 15.9 % — AB (ref 11.5–15.5)
WBC: 4.3 10*3/uL (ref 4.0–10.5)

## 2017-12-03 LAB — BASIC METABOLIC PANEL
Anion gap: 13 (ref 5–15)
BUN: 35 mg/dL — AB (ref 6–20)
CALCIUM: 8.6 mg/dL — AB (ref 8.9–10.3)
CO2: 37 mmol/L — AB (ref 22–32)
Chloride: 88 mmol/L — ABNORMAL LOW (ref 101–111)
Creatinine, Ser: 4.52 mg/dL — ABNORMAL HIGH (ref 0.61–1.24)
GFR calc non Af Amer: 14 mL/min — ABNORMAL LOW (ref >60)
GFR, EST AFRICAN AMERICAN: 16 mL/min — AB (ref >60)
Glucose, Bld: 119 mg/dL — ABNORMAL HIGH (ref 65–99)
Potassium: 3 mmol/L — ABNORMAL LOW (ref 3.5–5.1)
SODIUM: 138 mmol/L (ref 135–145)

## 2017-12-03 LAB — ABO/RH: ABO/RH(D): O POS

## 2017-12-03 LAB — PREPARE RBC (CROSSMATCH)

## 2017-12-03 MED ORDER — ACETAMINOPHEN 325 MG PO TABS: 650.0000 mg | ORAL_TABLET | Freq: Four times a day (QID) | ORAL | Status: DC | PRN

## 2017-12-03 MED ORDER — SODIUM CHLORIDE 0.9 % IV SOLN
Freq: Once | INTRAVENOUS | Status: AC
  Administered 2017-12-03: 12:00:00 via INTRAVENOUS

## 2017-12-03 MED ORDER — SODIUM CHLORIDE 0.9 % IV SOLN
1.0000 g | INTRAVENOUS | Status: DC
  Administered 2017-12-03 – 2017-12-06 (×4): 1 g via INTRAVENOUS
  Filled 2017-12-03 (×6): qty 10

## 2017-12-03 NOTE — Progress Notes (Addendum)
CRITICAL VALUE ALERT  Critical Value: Hgb 6.1  Date & Time Notied: 12/03/17 at 0742  Provider Notified: Dr. Rodena Piety  Orders Received/Actions taken: 2 units RBCs

## 2017-12-03 NOTE — Progress Notes (Signed)
PROGRESS NOTE    [name]  KKX:[id number] DOB: 1970/09/24 DOA: 11/29/2017 PCP: Patient, No Pcp Per  Brief Narrative:  47 year old African-American male with history of ESRD status post renal transplant, hypertension, and poorly controlled type 2 diabetes who presented to the emergency department with diarrhea that is now diagnosed secondary to enteropathogenic E. coli after eating at [employer].  As a result, he has become dehydrated and has developed AK I on stage V CKD with nephrology following.  He has been started on Rocephin for his E. coli diarrhea with slow improvement noted.   Assessment & Plan:   Principal Problem:   Acute renal failure superimposed on stage 5 chronic kidney disease, not on chronic dialysis (HCC) Active Problems:   DM2 (diabetes mellitus, type 2) (HCC)   Essential hypertension, benign   Deceased-donor kidney transplant   Hyponatremia   Diarrhea   Metabolic acidosis, NAG, failure of bicarbonate regeneration  1. AK I on CKD stage V-not on dialysis with prior transplant.  This is noted to be secondary to dehydration from his diarrhea.  Appreciate further nephrology recommendations with continuation of IV fluid.  Creatinine appears to be downtrending with acidosis that appears to have resolved.  Further fluid management per their recommendations with repeat labs in a.m.  Baseline creatinine noted to be 2-3.  He is currently at 5.1. 2. Diarrhea secondary to enteropathogenic E. coli.  Continue Rocephin with slow improvement noted.  Continue current diet which appears to be well tolerated.  He reports having 2 bowel movements today.  He denies seeing any fresh blood in the stools.  His stool is green in color according to his wife. 3. Essential hypertension.  Stable blood pressure readings currently noted. 4. Hyponatremia.  Resolved.  Continue to monitor. 5. 5] anemia with a drop in hemoglobin from 8-6.1.  Partly from dilution.  However I will order 2 units of blood  transfusion today.  Will also check FOBT.     DVT prophylaxis: SCD Code Status full code Family Communication: Wife at the bedside Disposition Plan: TBD Consultants:  Nephrology Procedures: None Antimicrobials: Rocephin started on 418  Subjective: Resting in bed in no acute distress   Objective: Complaining of being cold. Vitals:   12/02/17 1432 12/02/17 2059 12/02/17 2202 12/03/17 0534  BP: 130/75 114/63 125/68 132/78  Pulse: (!) 106 93 (!) 101 (!) 109  Resp: 18 18 18 18   Temp: 98 F (36.7 C)  99.5 F (37.5 C) 99.7 F (37.6 C)  TempSrc: Oral  Oral Oral  SpO2: 90% 100% 94% 94%    Intake/Output Summary (Last 24 hours) at 12/03/2017 1158 Last data filed at 12/03/2017 0552 Gross per 24 hour  Intake 875 ml  Output 1700 ml  Net -825 ml   There were no vitals filed for this visit.  Examination:  General exam: Appears calm and comfortable  Respiratory system: Clear to auscultation. Respiratory effort normal. Cardiovascular system: S1 & S2 heard, RRR. No JVD, murmurs, rubs, gallops or clicks. No pedal edema. Gastrointestinal system: Abdomen is nondistended, soft and nontender. No organomegaly or masses felt. Normal bowel sounds heard. Central nervous system: Alert and oriented. No focal neurological deficits. Extremities: Symmetric 5 x 5 power. Skin: No rashes, lesions or ulcers Psychiatry: Judgement and insight appear normal. Mood & affect appropriate.     Data Reviewed: I have personally reviewed following labs and imaging studies  CBC: Recent Labs  Lab 11/29/17 1855 12/03/17 0611  WBC 6.4 4.3  NEUTROABS 4.0  --  HGB 8.1* 6.1*  HCT 25.3* 20.1*  MCV 81.6 85.5  PLT 117* 79*   Basic Metabolic Panel: Recent Labs  Lab 11/30/17 0710 11/30/17 1811 12/01/17 0928 12/02/17 0819 12/03/17 0611  NA 134* 132* 136 139 138  K 3.6 3.5 3.5 3.1* 3.0*  CL 104 99* 97* 93* 88*  CO2 19* 20* 25 31 37*  GLUCOSE 143* 245* 187* 161* 119*  BUN 75* 67* 63* 47* 35*    CREATININE 7.27* 6.95* 6.25* 5.10* 4.52*  CALCIUM 9.6 9.6 9.3 9.0 8.6*   GFR: CrCl cannot be calculated (Unknown ideal weight.). Liver Function Tests: Recent Labs  Lab 11/29/17 1855  AST 12*  ALT 12*  ALKPHOS 94  BILITOT 0.6  PROT 6.9  ALBUMIN 4.3   Recent Labs  Lab 11/29/17 1855  LIPASE 39   No results for input(s): AMMONIA in the last 168 hours. Coagulation Profile: No results for input(s): INR, PROTIME in the last 168 hours. Cardiac Enzymes: No results for input(s): CKTOTAL, CKMB, CKMBINDEX, TROPONINI in the last 168 hours. BNP (last 3 results) No results for input(s): PROBNP in the last 8760 hours. HbA1C: No results for input(s): HGBA1C in the last 72 hours. CBG: Recent Labs  Lab 12/02/17 0748 12/02/17 0935 12/02/17 1205 12/02/17 1719 12/02/17 2200  GLUCAP 151* 150* 254* 155* 120*   Lipid Profile: No results for input(s): CHOL, HDL, LDLCALC, TRIG, CHOLHDL, LDLDIRECT in the last 72 hours. Thyroid Function Tests: No results for input(s): TSH, T4TOTAL, FREET4, T3FREE, THYROIDAB in the last 72 hours. Anemia Panel: No results for input(s): VITAMINB12, FOLATE, FERRITIN, TIBC, IRON, RETICCTPCT in the last 72 hours. Sepsis Labs: Recent Labs  Lab 11/29/17 2334 11/30/17 0257  LATICACIDVEN 2.0* 1.2    Recent Results (from the past 240 hour(s))  Gastrointestinal Panel by PCR , Stool     Status: Abnormal   Collection Time: 11/30/17 10:29 AM  Result Value Ref Range Status   Campylobacter species NOT DETECTED NOT DETECTED Final   Plesimonas shigelloides NOT DETECTED NOT DETECTED Final   Salmonella species NOT DETECTED NOT DETECTED Final   Yersinia enterocolitica NOT DETECTED NOT DETECTED Final   Vibrio species NOT DETECTED NOT DETECTED Final   Vibrio cholerae NOT DETECTED NOT DETECTED Final   Enteroaggregative E coli (EAEC) NOT DETECTED NOT DETECTED Final   Enteropathogenic E coli (EPEC) DETECTED (A) NOT DETECTED Final    Comment: RESULT CALLED TO, READ BACK  BY AND VERIFIED WITH: TJ SEXTON 11/30/17 1921 KLW    Enterotoxigenic E coli (ETEC) NOT DETECTED NOT DETECTED Final   Shiga like toxin producing E coli (STEC) NOT DETECTED NOT DETECTED Final   Shigella/Enteroinvasive E coli (EIEC) NOT DETECTED NOT DETECTED Final   Cryptosporidium NOT DETECTED NOT DETECTED Final   Cyclospora cayetanensis NOT DETECTED NOT DETECTED Final   Entamoeba histolytica NOT DETECTED NOT DETECTED Final   Giardia lamblia NOT DETECTED NOT DETECTED Final   Adenovirus F40/41 NOT DETECTED NOT DETECTED Final   Astrovirus NOT DETECTED NOT DETECTED Final   Norovirus GI/GII NOT DETECTED NOT DETECTED Final   Rotavirus A NOT DETECTED NOT DETECTED Final   Sapovirus (I, II, IV, and V) NOT DETECTED NOT DETECTED Final    Comment: Performed at [facility], [address] [place], [place] [address]  C difficile quick scan w PCR reflex     Status: None   Collection Time: 11/30/17 10:29 AM  Result Value Ref Range Status   C Diff antigen NEGATIVE NEGATIVE Final   C Diff  toxin NEGATIVE NEGATIVE Final   C Diff interpretation No C. difficile detected.  Final  MRSA PCR Screening     Status: None   Collection Time: 12/01/17  4:57 AM  Result Value Ref Range Status   MRSA by PCR NEGATIVE NEGATIVE Final    Comment:        The GeneXpert MRSA Assay (FDA approved for NASAL specimens only), is one component of a comprehensive MRSA colonization surveillance program. It is not intended to diagnose MRSA infection nor to guide or monitor treatment for MRSA infections. Performed at [facility], [place] [address], [place], [place] [address]          Radiology Studies: No results found.      Scheduled Meds: . aspirin EC  81 mg Oral Daily  . calcitRIOL  0.5 mcg Oral Daily  . dapsone  50 mg Oral Daily  . folic acid  1 mg Oral Daily  . insulin aspart  0-15 Units Subcutaneous TID WC  . insulin aspart  0-5 Units Subcutaneous QHS  . insulin glargine  5 Units  Subcutaneous QHS  . mycophenolate  720 mg Oral BID  . sodium bicarbonate  1,300 mg Oral TID  . tacrolimus  2.5 mg Oral QHS  . tacrolimus  3 mg Oral Daily   Continuous Infusions: . sodium chloride    . cefTRIAXone (ROCEPHIN)  IV Stopped (12/02/17 1334)     LOS: 4 days     Georgette Shell, MD [facility]  If 7PM-7AM, please contact night-coverage [email or web address] Password [facility] 12/03/2017, 11:58 AM

## 2017-12-03 NOTE — Progress Notes (Signed)
  [facility] Progress Note    Assessment/ Plan:   1.  Acute kidney injury with ESRD s/p kidney transplant: Cr improving, now at 4.52 (BL 2-3).  Patient continues to produce urine.  Diarrhea found to be related to E coli infection.  Suspect this is not related to acute renal transplant rejection.  There are no indications for dialysis at present.  We will continue Prograf at decrease dose given diarrhea and Myfortic.  Continue fluid resuscitation.  2.  Non-anioin gap metabolic acidosis: Likely from diarrheal illness.  Continue bicarb drip.  3.  Enteropathogenic E coli: Source of diarrhea and state of dehydration causing AKI.  On CTX. Per primary.  4.  Diabetes: CBGs well controlled with insulin.  Per primary.  5.  Hypertension: Remains normotensive with mild tachycardia today. Known E coli. infection.  6.  Hyponatremia:  Resolved. Will monitor.  6.  Acute blood loss anemia:  Suspect there may be a component of dilution given aggressive fluid resuscitation during hospitalization.  Stool has been without gross blood.  Will check FOBT.  Plan for 2 units of PRBC.  Subjective:   Patient states he is doing well today.  Diarrhea continues to improve.  No new complaints.   Objective:   BP 132/78 (BP Location: Right Arm)   Pulse (!) 109   Temp 99.7 F (37.6 C) (Oral)   Resp 18   SpO2 94%   Intake/Output Summary (Last 24 hours) at 12/03/2017 0919 Last data filed at 12/03/2017 2440 Gross per 24 hour  Intake 995 ml  Output 1700 ml  Net -705 ml   Weight change:   Physical Exam: General: emaciated male, NAD with non-toxic appearance HEENT: normocephalic, atraumatic, moist mucous membranes Cardiovascular: regular rate and rhythm without murmurs, rubs, or gallops Lungs: clear to auscultation bilaterally with normal work of breathing Abdomen: soft, non-tender, non-distended, normoactive bowel sounds GU: clear straw/yellow urine in foley bag Skin: warm, dry, no rashes or  lesions, cap refill < 2 seconds Extremities: warm and well perfused, normal tone, no edema  Imaging: No results found.  Labs: BMET Recent Labs  Lab 11/29/17 2310 11/30/17 0339 11/30/17 0710 11/30/17 1811 12/01/17 0928 12/02/17 0819 12/03/17 0611  NA 131* 135 134* 132* 136 139 138  K 4.3 3.9 3.6 3.5 3.5 3.1* 3.0*  CL 104 105 104 99* 97* 93* 88*  CO2 14* 16* 19* 20* 25 31 37*  GLUCOSE 308* 169* 143* 245* 187* 161* 119*  BUN 81* 79* 75* 67* 63* 47* 35*  CREATININE 7.77* 7.41* 7.27* 6.95* 6.25* 5.10* 4.52*  CALCIUM 9.8 9.8 9.6 9.6 9.3 9.0 8.6*   CBC Recent Labs  Lab 11/29/17 1855 12/03/17 0611  WBC 6.4 4.3  NEUTROABS 4.0  --   HGB 8.1* 6.1*  HCT 25.3* 20.1*  MCV 81.6 85.5  PLT 117* 79*    Medications:    . aspirin EC  81 mg Oral Daily  . calcitRIOL  0.5 mcg Oral Daily  . dapsone  50 mg Oral Daily  . folic acid  1 mg Oral Daily  . insulin aspart  0-15 Units Subcutaneous TID WC  . insulin aspart  0-5 Units Subcutaneous QHS  . insulin glargine  5 Units Subcutaneous QHS  . mycophenolate  720 mg Oral BID  . sodium bicarbonate  1,300 mg Oral TID  . tacrolimus  2.5 mg Oral QHS  . tacrolimus  3 mg Oral Daily      Harriet Butte, DO, PGY-2

## 2017-12-04 LAB — BASIC METABOLIC PANEL
Anion gap: 13 (ref 5–15)
BUN: 31 mg/dL — AB (ref 6–20)
CHLORIDE: 91 mmol/L — AB (ref 101–111)
CO2: 33 mmol/L — ABNORMAL HIGH (ref 22–32)
Calcium: 8.4 mg/dL — ABNORMAL LOW (ref 8.9–10.3)
Creatinine, Ser: 4.29 mg/dL — ABNORMAL HIGH (ref 0.61–1.24)
GFR calc Af Amer: 17 mL/min — ABNORMAL LOW (ref >60)
GFR calc non Af Amer: 15 mL/min — ABNORMAL LOW (ref >60)
Glucose, Bld: 121 mg/dL — ABNORMAL HIGH (ref 65–99)
POTASSIUM: 3.2 mmol/L — AB (ref 3.5–5.1)
SODIUM: 137 mmol/L (ref 135–145)

## 2017-12-04 LAB — BPAM RBC
BLOOD PRODUCT EXPIRATION DATE: 201904262359
BLOOD PRODUCT EXPIRATION DATE: 201904262359
ISSUE DATE / TIME: 201904201204
ISSUE DATE / TIME: 201904201523
UNIT TYPE AND RH: 5100
Unit Type and Rh: 5100

## 2017-12-04 LAB — TYPE AND SCREEN
ABO/RH(D): O POS
ANTIBODY SCREEN: NEGATIVE
UNIT DIVISION: 0
UNIT DIVISION: 0

## 2017-12-04 LAB — CBC
HCT: 26.4 % — ABNORMAL LOW (ref 39.0–52.0)
HEMOGLOBIN: 8.2 g/dL — AB (ref 13.0–17.0)
MCH: 26.4 pg (ref 26.0–34.0)
MCHC: 31.1 g/dL (ref 30.0–36.0)
MCV: 84.9 fL (ref 78.0–100.0)
Platelets: 71 10*3/uL — ABNORMAL LOW (ref 150–400)
RBC: 3.11 MIL/uL — ABNORMAL LOW (ref 4.22–5.81)
RDW: 16.5 % — ABNORMAL HIGH (ref 11.5–15.5)
WBC: 5.5 10*3/uL (ref 4.0–10.5)

## 2017-12-04 LAB — GLUCOSE, CAPILLARY
GLUCOSE-CAPILLARY: 111 mg/dL — AB (ref 65–99)
GLUCOSE-CAPILLARY: 153 mg/dL — AB (ref 65–99)
GLUCOSE-CAPILLARY: 159 mg/dL — AB (ref 65–99)
Glucose-Capillary: 150 mg/dL — ABNORMAL HIGH (ref 65–99)
Glucose-Capillary: 141 mg/dL — ABNORMAL HIGH (ref 65–99)

## 2017-12-04 LAB — OCCULT BLOOD X 1 CARD TO LAB, STOOL: FECAL OCCULT BLD: NEGATIVE

## 2017-12-04 MED ORDER — SODIUM CHLORIDE 0.9 % IV SOLN
INTRAVENOUS | Status: AC
  Administered 2017-12-04 – 2017-12-05 (×2): via INTRAVENOUS

## 2017-12-04 NOTE — Progress Notes (Addendum)
[name] Progress Note    Assessment/ Plan:   1.  Acute kidney injury with ESRD s/p kidney transplant: Cr improving, now at 4.3 (BL 2-3).  Patient continues to produce urine.  Diarrhea found to be related to E coli infection.  Suspect this is not related to acute renal transplant rejection.  There are no indications for dialysis at present.  We will continue Prograf at decrease dose given diarrhea and Myfortic.  Continue fluid resuscitation- will do NS today. Recheck prograf trough  2.  Non-anion gap metabolic acidosis: Likely from diarrheal illness.  Resolve s/p bicarb gtt.  3.  Enteropathogenic E coli: Source of diarrhea and state of dehydration causing AKI.  On CTX. Per primary.  4.  Diabetes: CBGs well controlled with insulin.  Per primary.  5.  Hypertension: Remains normotensive with mild tachycardia today. Known E coli. infection.  6.  Hyponatremia:  Resolved. Will monitor.  6.  Anemia:  Suspect there may be a component of dilution given aggressive fluid resuscitation during hospitalization.  Stool has been without gross blood.  Will check FOBT. S/p 2 u pRBCs yesterday.  Subjective:   No acute events.  S/p pRBCs with appropriate bump.  Cr plateauing off fluids, pt still having diarrhea   Objective:   BP 139/88 (BP Location: Right Arm)   Pulse 98   Temp 99.6 F (37.6 C) (Oral)   Resp 17   SpO2 97%   Intake/Output Summary (Last 24 hours) at 12/04/2017 1238 Last data filed at 12/03/2017 2015 Gross per 24 hour  Intake 870 ml  Output -  Net 870 ml   Weight change:   Physical Exam: General: emaciated male, NAD with non-toxic appearance HEENT: normocephalic, atraumatic, moist mucous membranes Cardiovascular: regular rate and rhythm without murmurs, rubs, or gallops Lungs: clear to auscultation bilaterally with normal work of breathing Abdomen: soft, non-tender, non-distended, normoactive bowel sounds GU: clear straw/yellow urine in foley bag Skin: warm,  dry, no rashes or lesions, cap refill < 2 seconds Extremities: warm and well perfused, normal tone, no edema  Imaging: No results found.  Labs: BMET Recent Labs  Lab 11/30/17 0339 11/30/17 0710 11/30/17 1811 12/01/17 0928 12/02/17 0819 12/03/17 0611 12/04/17 0636  NA 135 134* 132* 136 139 138 137  K 3.9 3.6 3.5 3.5 3.1* 3.0* 3.2*  CL 105 104 99* 97* 93* 88* 91*  CO2 16* 19* 20* 25 31 37* 33*  GLUCOSE 169* 143* 245* 187* 161* 119* 121*  BUN 79* 75* 67* 63* 47* 35* 31*  CREATININE 7.41* 7.27* 6.95* 6.25* 5.10* 4.52* 4.29*  CALCIUM 9.8 9.6 9.6 9.3 9.0 8.6* 8.4*   CBC Recent Labs  Lab 11/29/17 1855 12/03/17 0611 12/04/17 0636  WBC 6.4 4.3 5.5  NEUTROABS 4.0  --   --   HGB 8.1* 6.1* 8.2*  HCT 25.3* 20.1* 26.4*  MCV 81.6 85.5 84.9  PLT 117* 79* 71*    Medications:    . aspirin EC  81 mg Oral Daily  . calcitRIOL  0.5 mcg Oral Daily  . dapsone  50 mg Oral Daily  . folic acid  1 mg Oral Daily  . insulin aspart  0-15 Units Subcutaneous TID WC  . insulin aspart  0-5 Units Subcutaneous QHS  . insulin glargine  5 Units Subcutaneous QHS  . mycophenolate  720 mg Oral BID  . sodium bicarbonate  1,300 mg Oral TID  . tacrolimus  2.5 mg Oral QHS  . tacrolimus  3 mg Oral Daily  Madelon Lips MD [employer]

## 2017-12-04 NOTE — Progress Notes (Signed)
PROGRESS NOTE    [name]  XBJ:[id number] DOB: 1970-12-10 DOA: 11/29/2017 PCP: Patient, No Pcp Per  Brief Narrative: 47 year old African-American male with history of ESRD status post renal transplant, hypertension, and poorly controlled type 2 diabetes who presented to the emergency department with diarrhea that is now diagnosed secondary to enteropathogenic E. coli after eating at [employer]. As a result, he has become dehydrated and has developed AK I on stage V CKD with nephrology following. He has been started on Rocephin for his E. coli diarrhea with slow improvement noted.      Assessment & Plan:   Principal Problem:   Acute renal failure superimposed on stage 5 chronic kidney disease, not on chronic dialysis (HCC) Active Problems:   DM2 (diabetes mellitus, type 2) (HCC)   Essential hypertension, benign   Deceased-donor kidney transplant   Hyponatremia   Diarrhea   Metabolic acidosis, NAG, failure of bicarbonate regeneration  1. AK I on CKD stage V-not on dialysis with prior transplant. This is noted to be secondary to dehydration from his diarrhea. Appreciate further nephrology recommendations with continuation of IV fluid. Creatinine appears to be downtrending with acidosis that appears to have resolved. Further fluid management per their recommendations with repeat labs in a.m. Baseline creatinine noted to be 2-3. He is currently at 4.29. 2. Diarrhea secondary to enteropathogenic E. coli. Continue Rocephin with slow improvement noted. Continue current diet which appears to be well tolerated.  He reports having 5 bowel movements today.  He denies seeing any fresh blood in the stools.  His stool is green in color according to his wife.FOBT NEGATIVE. 3. Essential hypertension. Stable blood pressure readings currently noted. 4. Hyponatremia. Resolved. Continue to monitor. 5. 5] anemia with a drop in hemoglobin from 8-6.1.  Partly from dilution.  Status post 2 units of  blood transfusion.      DVT prophylaxis: SCD Code Status:FULL Family Communication: Wife at bedside Disposition Plan: TBD nephrology   Consultants: Nephrology Procedures: None Antimicrobials: Rocephin started 418  Subjective: Feels slightly better after blood transfusion denies any chest pain shortness of breath but had 5 loose BMs yesterday.  Objective: Vitals:   12/03/17 1935 12/03/17 2332 12/04/17 0655 12/04/17 1324  BP: (!) 151/52 129/88 139/88 (!) 139/92  Pulse: 94 (!) 103 98 (!) 108  Resp: 16 17 17 18   Temp: 99.9 F (37.7 C) 98.9 F (37.2 C) 99.6 F (37.6 C) 99.9 F (37.7 C)  TempSrc: Oral Oral Oral Oral  SpO2: 98% 95% 97% 93%    Intake/Output Summary (Last 24 hours) at 12/04/2017 1404 Last data filed at 12/03/2017 2015 Gross per 24 hour  Intake 870 ml  Output -  Net 870 ml   There were no vitals filed for this visit.  Examination:  General exam: Appears calm and comfortable  Respiratory system: Clear to auscultation. Respiratory effort normal. Cardiovascular system: S1 & S2 heard, RRR. No JVD, murmurs, rubs, gallops or clicks. No pedal edema. Gastrointestinal system: Abdomen is nondistended, soft and nontender. No organomegaly or masses felt. Normal bowel sounds heard. Central nervous system: Alert and oriented. No focal neurological deficits. Extremities: Symmetric 5 x 5 power. Skin: No rashes, lesions or ulcers Psychiatry: Judgement and insight appear normal. Mood & affect appropriate.     Data Reviewed: I have personally reviewed following labs and imaging studies  CBC: Recent Labs  Lab 11/29/17 1855 12/03/17 0611 12/04/17 0636  WBC 6.4 4.3 5.5  NEUTROABS 4.0  --   --   HGB 8.1*  6.1* 8.2*  HCT 25.3* 20.1* 26.4*  MCV 81.6 85.5 84.9  PLT 117* 79* 71*   Basic Metabolic Panel: Recent Labs  Lab 11/30/17 1811 12/01/17 0928 12/02/17 0819 12/03/17 0611 12/04/17 0636  NA 132* 136 139 138 137  K 3.5 3.5 3.1* 3.0* 3.2*  CL 99* 97* 93* 88*  91*  CO2 20* 25 31 37* 33*  GLUCOSE 245* 187* 161* 119* 121*  BUN 67* 63* 47* 35* 31*  CREATININE 6.95* 6.25* 5.10* 4.52* 4.29*  CALCIUM 9.6 9.3 9.0 8.6* 8.4*   GFR: CrCl cannot be calculated (Unknown ideal weight.). Liver Function Tests: Recent Labs  Lab 11/29/17 1855  AST 12*  ALT 12*  ALKPHOS 94  BILITOT 0.6  PROT 6.9  ALBUMIN 4.3   Recent Labs  Lab 11/29/17 1855  LIPASE 39   No results for input(s): AMMONIA in the last 168 hours. Coagulation Profile: No results for input(s): INR, PROTIME in the last 168 hours. Cardiac Enzymes: No results for input(s): CKTOTAL, CKMB, CKMBINDEX, TROPONINI in the last 168 hours. BNP (last 3 results) No results for input(s): PROBNP in the last 8760 hours. HbA1C: No results for input(s): HGBA1C in the last 72 hours. CBG: Recent Labs  Lab 12/02/17 1719 12/02/17 2200 12/03/17 2336 12/04/17 0759 12/04/17 1213  GLUCAP 155* 120* 159* 111* 153*   Lipid Profile: No results for input(s): CHOL, HDL, LDLCALC, TRIG, CHOLHDL, LDLDIRECT in the last 72 hours. Thyroid Function Tests: No results for input(s): TSH, T4TOTAL, FREET4, T3FREE, THYROIDAB in the last 72 hours. Anemia Panel: No results for input(s): VITAMINB12, FOLATE, FERRITIN, TIBC, IRON, RETICCTPCT in the last 72 hours. Sepsis Labs: Recent Labs  Lab 11/29/17 2334 11/30/17 0257  LATICACIDVEN 2.0* 1.2    Recent Results (from the past 240 hour(s))  Gastrointestinal Panel by PCR , Stool     Status: Abnormal   Collection Time: 11/30/17 10:29 AM  Result Value Ref Range Status   Campylobacter species NOT DETECTED NOT DETECTED Final   Plesimonas shigelloides NOT DETECTED NOT DETECTED Final   Salmonella species NOT DETECTED NOT DETECTED Final   Yersinia enterocolitica NOT DETECTED NOT DETECTED Final   Vibrio species NOT DETECTED NOT DETECTED Final   Vibrio cholerae NOT DETECTED NOT DETECTED Final   Enteroaggregative E coli (EAEC) NOT DETECTED NOT DETECTED Final    Enteropathogenic E coli (EPEC) DETECTED (A) NOT DETECTED Final    Comment: RESULT CALLED TO, READ BACK BY AND VERIFIED WITH: TJ SEXTON 11/30/17 1921 KLW    Enterotoxigenic E coli (ETEC) NOT DETECTED NOT DETECTED Final   Shiga like toxin producing E coli (STEC) NOT DETECTED NOT DETECTED Final   Shigella/Enteroinvasive E coli (EIEC) NOT DETECTED NOT DETECTED Final   Cryptosporidium NOT DETECTED NOT DETECTED Final   Cyclospora cayetanensis NOT DETECTED NOT DETECTED Final   Entamoeba histolytica NOT DETECTED NOT DETECTED Final   Giardia lamblia NOT DETECTED NOT DETECTED Final   Adenovirus F40/41 NOT DETECTED NOT DETECTED Final   Astrovirus NOT DETECTED NOT DETECTED Final   Norovirus GI/GII NOT DETECTED NOT DETECTED Final   Rotavirus A NOT DETECTED NOT DETECTED Final   Sapovirus (I, II, IV, and V) NOT DETECTED NOT DETECTED Final    Comment: Performed at [facility], [address] [place], [place] [address]  C difficile quick scan w PCR reflex     Status: None   Collection Time: 11/30/17 10:29 AM  Result Value Ref Range Status   C Diff antigen NEGATIVE NEGATIVE Final   C  Diff toxin NEGATIVE NEGATIVE Final   C Diff interpretation No C. difficile detected.  Final  MRSA PCR Screening     Status: None   Collection Time: 12/01/17  4:57 AM  Result Value Ref Range Status   MRSA by PCR NEGATIVE NEGATIVE Final    Comment:        The GeneXpert MRSA Assay (FDA approved for NASAL specimens only), is one component of a comprehensive MRSA colonization surveillance program. It is not intended to diagnose MRSA infection nor to guide or monitor treatment for MRSA infections. Performed at [facility], [place] [address] [place],  [address]          Radiology Studies: No results found.      Scheduled Meds: . aspirin EC  81 mg Oral Daily  . calcitRIOL  0.5 mcg Oral Daily  . dapsone  50 mg Oral Daily  . folic acid  1 mg Oral Daily  . insulin aspart  0-15 Units  Subcutaneous TID WC  . insulin aspart  0-5 Units Subcutaneous QHS  . insulin glargine  5 Units Subcutaneous QHS  . mycophenolate  720 mg Oral BID  . sodium bicarbonate  1,300 mg Oral TID  . tacrolimus  2.5 mg Oral QHS  . tacrolimus  3 mg Oral Daily   Continuous Infusions: . sodium chloride    . cefTRIAXone (ROCEPHIN)  IV Stopped (12/03/17 2015)     LOS: 5 days      Georgette Shell, MD [facility]  If 7PM-7AM, please contact night-coverage [email or web address] Password [facility] 12/04/2017, 2:04 PM

## 2017-12-04 NOTE — Progress Notes (Signed)
PT Cancellation Note  Patient Details Name: [name] MRN: [id number] DOB: Dec 02, 1970   Cancelled Treatment:    Reason Eval/Treat Not Completed: Patient declined, no reason specified. Pt reports he prefers to ambulate later and can ambulate with wife.  Will return tomorrow for PT evaluation.   Shanna Cisco 12/04/2017, 3:24 PM

## 2017-12-05 LAB — GLUCOSE, CAPILLARY
GLUCOSE-CAPILLARY: 109 mg/dL — AB (ref 65–99)
GLUCOSE-CAPILLARY: 109 mg/dL — AB (ref 65–99)
GLUCOSE-CAPILLARY: 119 mg/dL — AB (ref 65–99)
Glucose-Capillary: 152 mg/dL — ABNORMAL HIGH (ref 65–99)
Glucose-Capillary: 142 mg/dL — ABNORMAL HIGH (ref 65–99)
Glucose-Capillary: 170 mg/dL — ABNORMAL HIGH (ref 65–99)
Glucose-Capillary: 140 mg/dL — ABNORMAL HIGH (ref 65–99)

## 2017-12-05 LAB — BASIC METABOLIC PANEL
ANION GAP: 10 (ref 5–15)
BUN: 20 mg/dL (ref 6–20)
CHLORIDE: 95 mmol/L — AB (ref 101–111)
CO2: 30 mmol/L (ref 22–32)
Calcium: 7.9 mg/dL — ABNORMAL LOW (ref 8.9–10.3)
Creatinine, Ser: 3.74 mg/dL — ABNORMAL HIGH (ref 0.61–1.24)
GFR calc non Af Amer: 18 mL/min — ABNORMAL LOW (ref >60)
GFR, EST AFRICAN AMERICAN: 21 mL/min — AB (ref >60)
Glucose, Bld: 105 mg/dL — ABNORMAL HIGH (ref 65–99)
POTASSIUM: 3 mmol/L — AB (ref 3.5–5.1)
Sodium: 135 mmol/L (ref 135–145)

## 2017-12-05 LAB — CBC WITH DIFFERENTIAL/PLATELET
Basophils Absolute: 0 10*3/uL (ref 0.0–0.1)
Basophils Relative: 0 %
EOS ABS: 0.7 10*3/uL (ref 0.0–0.7)
Eosinophils Relative: 14 %
HEMATOCRIT: 24.3 % — AB (ref 39.0–52.0)
HEMOGLOBIN: 7.8 g/dL — AB (ref 13.0–17.0)
LYMPHS ABS: 1.3 10*3/uL (ref 0.7–4.0)
Lymphocytes Relative: 26 %
MCH: 27 pg (ref 26.0–34.0)
MCHC: 32.1 g/dL (ref 30.0–36.0)
MCV: 84.1 fL (ref 78.0–100.0)
MONOS PCT: 12 %
Monocytes Absolute: 0.6 10*3/uL (ref 0.1–1.0)
NEUTROS ABS: 2.3 10*3/uL (ref 1.7–7.7)
NEUTROS PCT: 48 %
Platelets: 73 10*3/uL — ABNORMAL LOW (ref 150–400)
RBC: 2.89 MIL/uL — AB (ref 4.22–5.81)
RDW: 16.3 % — ABNORMAL HIGH (ref 11.5–15.5)
WBC: 4.9 10*3/uL (ref 4.0–10.5)

## 2017-12-05 LAB — MAGNESIUM: MAGNESIUM: 0.7 mg/dL — AB (ref 1.7–2.4)

## 2017-12-05 MED ORDER — MAGNESIUM SULFATE 2 GM/50ML IV SOLN
2.0000 g | Freq: Once | INTRAVENOUS | Status: AC
  Administered 2017-12-05: 2 g via INTRAVENOUS
  Filled 2017-12-05: qty 50

## 2017-12-05 MED ORDER — POTASSIUM CHLORIDE CRYS ER 20 MEQ PO TBCR
40.0000 meq | EXTENDED_RELEASE_TABLET | Freq: Two times a day (BID) | ORAL | Status: AC
  Administered 2017-12-05 – 2017-12-06 (×4): 40 meq via ORAL
  Filled 2017-12-05 (×4): qty 2

## 2017-12-05 NOTE — Progress Notes (Addendum)
PROGRESS NOTE    [name]  EXH:[id number] DOB: [birth year] DOA: 11/29/2017 PCP: Patient, No Pcp Per  Brief Narrative: 47 year old African-American male with history of ESRD status post renal transplant, hypertension, and poorly controlled type 2 diabetes who presented to the emergency department with diarrhea that is now diagnosed secondary to enteropathogenic E. coli after eating at [employer]. As a result, he has become dehydrated and has developed AK I on stage V CKD with nephrology following. He has been started on Rocephin for his E. coli diarrhea with slow improvement noted.    Assessment & Plan:   Principal Problem:   Acute renal failure superimposed on stage 5 chronic kidney disease, not on chronic dialysis (HCC) Active Problems:   DM2 (diabetes mellitus, type 2) (HCC)   Essential hypertension, benign   Deceased-donor kidney transplant   Hyponatremia   Diarrhea   Metabolic acidosis, NAG, failure of bicarbonate regeneration  1. AK I on CKD stage V-not on dialysis with prior transplant. This is noted to be secondary to dehydration from his diarrhea. Appreciate further nephrology recommendations with continuation of IV fluid. Creatinine appears to be downtrending with acidosis that appears to have resolved. Further fluid management per their recommendations with repeat labs in a.m. Baseline creatinine noted to be 2-3. He is currently at 3.74. 2. Diarrhea secondary to enteropathogenic E. Coli-patient reports having no further diarrhea since yesterday.  He has had formed stools.  Continue Rocephin. 3. Essential hypertension. Stable blood pressure readings currently noted. 4. Hyponatremia. Resolved. Continue to monitor. 5. 5]anemia  Status post 2 units of blood transfusion. 6. Hypokalemia hypomagnesemia being repleted.        DVT prophylaxis:scd Code Statusfull Family Communication: dw wife Disposition Plan: tbd   Consultants: Nephrology  Procedures:  None Antimicrobials: Rocephin  Subjective: Feels better walked with therapy diarrhea resolving  Objective: Vitals:   12/04/17 1324 12/04/17 2132 12/05/17 0431 12/05/17 1352  BP: (!) 139/92 (!) 153/99 (!) 155/55 (!) 154/78  Pulse: (!) 108 (!) 101 92 86  Resp: 18 17 17 17   Temp: 99.9 F (37.7 C) 99.4 F (37.4 C) 100.2 F (37.9 C) 98.5 F (36.9 C)  TempSrc: Oral Oral Oral Axillary  SpO2: 93% 94% 94% 95%    Intake/Output Summary (Last 24 hours) at 12/05/2017 1354 Last data filed at 12/05/2017 1121 Gross per 24 hour  Intake 657.75 ml  Output 950 ml  Net -292.25 ml   There were no vitals filed for this visit.  Examination:  General exam: Appears calm and comfortable  Respiratory system: Clear to auscultation. Respiratory effort normal. Cardiovascular system: S1 & S2 heard, RRR. No JVD, murmurs, rubs, gallops or clicks. No pedal edema. Gastrointestinal system: Abdomen is nondistended, soft and nontender. No organomegaly or masses felt. Normal bowel sounds heard. Central nervous system: Alert and oriented. No focal neurological deficits. Extremities: Symmetric 5 x 5 power. Skin: No rashes, lesions or ulcers Psychiatry: Judgement and insight appear normal. Mood & affect appropriate.     Data Reviewed: I have personally reviewed following labs and imaging studies  CBC: Recent Labs  Lab 11/29/17 1855 12/03/17 0611 12/04/17 0636 12/05/17 0428  WBC 6.4 4.3 5.5 4.9  NEUTROABS 4.0  --   --  2.3  HGB 8.1* 6.1* 8.2* 7.8*  HCT 25.3* 20.1* 26.4* 24.3*  MCV 81.6 85.5 84.9 84.1  PLT 117* 79* 71* 73*   Basic Metabolic Panel: Recent Labs  Lab 12/01/17 0928 12/02/17 0819 12/03/17 0611 12/04/17 0636 12/05/17 0428  NA 136 139 138  137 135  K 3.5 3.1* 3.0* 3.2* 3.0*  CL 97* 93* 88* 91* 95*  CO2 25 31 37* 33* 30  GLUCOSE 187* 161* 119* 121* 105*  BUN 63* 47* 35* 31* 20  CREATININE 6.25* 5.10* 4.52* 4.29* 3.74*  CALCIUM 9.3 9.0 8.6* 8.4* 7.9*  MG  --   --   --   --  0.7*    GFR: CrCl cannot be calculated (Unknown ideal weight.). Liver Function Tests: Recent Labs  Lab 11/29/17 1855  AST 12*  ALT 12*  ALKPHOS 94  BILITOT 0.6  PROT 6.9  ALBUMIN 4.3   Recent Labs  Lab 11/29/17 1855  LIPASE 39   No results for input(s): AMMONIA in the last 168 hours. Coagulation Profile: No results for input(s): INR, PROTIME in the last 168 hours. Cardiac Enzymes: No results for input(s): CKTOTAL, CKMB, CKMBINDEX, TROPONINI in the last 168 hours. BNP (last 3 results) No results for input(s): PROBNP in the last 8760 hours. HbA1C: No results for input(s): HGBA1C in the last 72 hours. CBG: Recent Labs  Lab 12/04/17 1213 12/04/17 1726 12/04/17 2128 12/05/17 0755 12/05/17 1203  GLUCAP 153* 150* 141* 109* 119*   Lipid Profile: No results for input(s): CHOL, HDL, LDLCALC, TRIG, CHOLHDL, LDLDIRECT in the last 72 hours. Thyroid Function Tests: No results for input(s): TSH, T4TOTAL, FREET4, T3FREE, THYROIDAB in the last 72 hours. Anemia Panel: No results for input(s): VITAMINB12, FOLATE, FERRITIN, TIBC, IRON, RETICCTPCT in the last 72 hours. Sepsis Labs: Recent Labs  Lab 11/29/17 2334 11/30/17 0257  LATICACIDVEN 2.0* 1.2    Recent Results (from the past 240 hour(s))  Gastrointestinal Panel by PCR , Stool     Status: Abnormal   Collection Time: 11/30/17 10:29 AM  Result Value Ref Range Status   Campylobacter species NOT DETECTED NOT DETECTED Final   Plesimonas shigelloides NOT DETECTED NOT DETECTED Final   Salmonella species NOT DETECTED NOT DETECTED Final   Yersinia enterocolitica NOT DETECTED NOT DETECTED Final   Vibrio species NOT DETECTED NOT DETECTED Final   Vibrio cholerae NOT DETECTED NOT DETECTED Final   Enteroaggregative E coli (EAEC) NOT DETECTED NOT DETECTED Final   Enteropathogenic E coli (EPEC) DETECTED (A) NOT DETECTED Final    Comment: RESULT CALLED TO, READ BACK BY AND VERIFIED WITH: TJ SEXTON 11/30/17 1921 KLW    Enterotoxigenic E  coli (ETEC) NOT DETECTED NOT DETECTED Final   Shiga like toxin producing E coli (STEC) NOT DETECTED NOT DETECTED Final   Shigella/Enteroinvasive E coli (EIEC) NOT DETECTED NOT DETECTED Final   Cryptosporidium NOT DETECTED NOT DETECTED Final   Cyclospora cayetanensis NOT DETECTED NOT DETECTED Final   Entamoeba histolytica NOT DETECTED NOT DETECTED Final   Giardia lamblia NOT DETECTED NOT DETECTED Final   Adenovirus F40/41 NOT DETECTED NOT DETECTED Final   Astrovirus NOT DETECTED NOT DETECTED Final   Norovirus GI/GII NOT DETECTED NOT DETECTED Final   Rotavirus A NOT DETECTED NOT DETECTED Final   Sapovirus (I, II, IV, and V) NOT DETECTED NOT DETECTED Final    Comment: Performed at [facility], [address] [place], [place] [address]  C difficile quick scan w PCR reflex     Status: None   Collection Time: 11/30/17 10:29 AM  Result Value Ref Range Status   C Diff antigen NEGATIVE NEGATIVE Final   C Diff toxin NEGATIVE NEGATIVE Final   C Diff interpretation No C. difficile detected.  Final  MRSA PCR Screening     Status: None  Collection Time: 12/01/17  4:57 AM  Result Value Ref Range Status   MRSA by PCR NEGATIVE NEGATIVE Final    Comment:        The GeneXpert MRSA Assay (FDA approved for NASAL specimens only), is one component of a comprehensive MRSA colonization surveillance program. It is not intended to diagnose MRSA infection nor to guide or monitor treatment for MRSA infections. Performed at [facility], [place] [address], [place], [place] [address]          Radiology Studies: No results found.      Scheduled Meds: . aspirin EC  81 mg Oral Daily  . calcitRIOL  0.5 mcg Oral Daily  . dapsone  50 mg Oral Daily  . folic acid  1 mg Oral Daily  . insulin aspart  0-15 Units Subcutaneous TID WC  . insulin aspart  0-5 Units Subcutaneous QHS  . insulin glargine  5 Units Subcutaneous QHS  . mycophenolate  720 mg Oral BID  . potassium chloride  40 mEq  Oral BID  . tacrolimus  2.5 mg Oral QHS  . tacrolimus  3 mg Oral Daily   Continuous Infusions: . cefTRIAXone (ROCEPHIN)  IV Stopped (12/04/17 2032)  . magnesium sulfate 1 - 4 g bolus IVPB 2 g (12/05/17 1344)     LOS: 6 days     Georgette Shell, MD [facility]  If 7PM-7AM, please contact night-coverage [email or web address] Password [facility] 12/05/2017, 1:54 PM

## 2017-12-05 NOTE — Progress Notes (Signed)
CRITICAL VALUE ALERT  Critical Value:  Mag 0.7  Date & Time Notied:  12/05/17 at 1205  Provider Notified: Rodena Piety  Orders Received/Actions taken: Mag IV ordered and given.

## 2017-12-05 NOTE — Evaluation (Signed)
Physical Therapy Evaluation Patient Details Name: [name] MRN: [id number] DOB: [birth year] Today's Date: 12/05/2017   History of Present Illness  47yo male who presented to the ED with diarrhea, nausea. Diagnosed with ARD with stage 5 CKD, ecoli infection following a meal at a [employer]. PMH CHF, DM, ESRD on HD, HTN, kidney transplant, cardiomyopathy, recent back surgery   Clinical Impression   Patient received sitting EOB with wife present, pleasant and willing to participate in PT this morning. He is able to perform sit to stand and stand pivot to bedside commode with MinA mostly for balance and steadying/safety, unable to attempt further mobility today as patient accidentally urinated on floor and politely declined ambulation until being bathed following this. Of note, his wife is a retired [employer] manager and reports no difficulties or concerns regarding care-giving at home, demonstrates ability to perform safe transfers with patient; wife able to state back precautions, patient requires cuing. He was left on bedside commode per his request with wife present, all other needs met this morning. He will continue to benefit from skilled PT services in the acute setting, as well as skilled HHPT services moving forward.     Follow Up Recommendations Home health PT;Supervision/Assistance - 24 hour    Equipment Recommendations  Rolling [place] with 5" wheels    Recommendations for Other Services       Precautions / Restrictions Precautions Precautions: Fall;Back Precaution Booklet Issued: No Precaution Comments: recent back surgery- no bending, lifting, twisting  Restrictions Weight Bearing Restrictions: No      Mobility  Bed Mobility               General bed mobility comments: DNT, received sitting up at edge of bed   Transfers Overall transfer level: Needs assistance Equipment used: None Transfers: Sit to/from Stand;Stand Pivot Transfers Sit to Stand: Min assist Stand  pivot transfers: Min assist       General transfer comment: MinA primarily for balance without device   Ambulation/Gait             General Gait Details: DNT today   Stairs            Wheelchair Mobility    Modified Rankin (Stroke Patients Only)       Balance Overall balance assessment: Needs assistance Sitting-balance support: Feet supported;Bilateral upper extremity supported Sitting balance-Leahy Scale: Good     Standing balance support: No upper extremity supported;During functional activity Standing balance-Leahy Scale: Fair Standing balance comment: gross weakness noted, mild unsteadiness requiring MinA for steadying and safety                              Pertinent Vitals/Pain Pain Assessment: 0-10 Pain Score: 7  Pain Location: L knee  Pain Descriptors / Indicators: Aching;Sore Pain Intervention(s): Limited activity within patient's tolerance;Monitored during session    Ingenio expects to be discharged to:: Private residence Living Arrangements: Spouse/significant other Available Help at Discharge: Family;Available 24 hours/day Type of Home: Apartment Home Access: Level entry     Home Layout: One level Home Equipment: Grab bars - tub/shower;Grab bars - toilet;Bedside commode Additional Comments: wife is a retired [employer] manager and acts as caretaker for patient at home; they have a handicapped apartment with full accessibility     Prior Function Level of Independence: Independent with assistive device(s)               Hand Dominance  Extremity/Trunk Assessment        Lower Extremity Assessment Lower Extremity Assessment: Generalized weakness    Cervical / Trunk Assessment Cervical / Trunk Assessment: Normal  Communication   Communication: No difficulties  Cognition Arousal/Alertness: Awake/alert Behavior During Therapy: WFL for tasks assessed/performed Overall Cognitive Status: Within Functional  Limits for tasks assessed                                        General Comments General comments (skin integrity, edema, etc.): patient transferred to bedside commode then accidentally urinated on floor, declined gait/further mobility until being bathed (wife in room and assisting)    Exercises     Assessment/Plan    PT Assessment Patient needs continued PT services  PT Problem List Decreased strength;Decreased mobility;Decreased safety awareness;Decreased coordination;Decreased knowledge of precautions;Decreased balance       PT Treatment Interventions DME instruction;Therapeutic activities;Gait training;Therapeutic exercise;Patient/family education;Stair training;Balance training;Functional mobility training;Neuromuscular re-education;Manual techniques    PT Goals (Current goals can be found in the Care Plan section)  Acute Rehab PT Goals Patient Stated Goal: to go home, resume PT for his back  PT Goal Formulation: With patient/family Time For Goal Achievement: 12/19/17 Potential to Achieve Goals: Good    Frequency Min 3X/week   Barriers to discharge        Co-evaluation               AM-PAC PT "6 Clicks" Daily Activity  Outcome Measure Difficulty turning over in bed (including adjusting bedclothes, sheets and blankets)?: Unable Difficulty moving from lying on back to sitting on the side of the bed? : Unable Difficulty sitting down on and standing up from a chair with arms (e.g., wheelchair, bedside commode, etc,.)?: Unable Help needed moving to and from a bed to chair (including a wheelchair)?: A Little Help needed walking in hospital room?: A Little Help needed climbing 3-5 steps with a railing? : A Lot 6 Click Score: 11    End of Session   Activity Tolerance: Patient tolerated treatment well Patient left: Other (comment);with family/visitor present(on bedside commode with wife present )   PT Visit Diagnosis: Unsteadiness on feet  (R26.81);Muscle weakness (generalized) (M62.81);Difficulty in walking, not elsewhere classified (R26.2);History of falling (Z91.81)    Time: 9179-1505 PT Time Calculation (min) (ACUTE ONLY): 19 min   Charges:   PT Evaluation $PT Eval Moderate Complexity: 1 Mod     PT G Codes:        Deniece Ree PT, DPT, CBIS  Supplemental Physical Therapist [place]   Pager [phone]

## 2017-12-05 NOTE — Progress Notes (Signed)
[facility] Progress Note    Assessment/ Plan:   1.  Acute kidney injury with ESRD s/p kidney transplant: Cr continues to improve, now 3.74 (BL 2-3).  UOP 0.950 L last 24 hours.  Diarrhea found to be related to E coli infection.  Suspect this is not related to acute renal transplant rejection.  There are no indications for dialysis at present.  We will continue Prograf at decrease dose given diarrhea and Myfortic. Recheck prograf trough.  2.  Non-anion gap metabolic acidosis: Likely from diarrheal illness.  Resolve s/p bicarb gtt.  3.  Enteropathogenic E coli: Source of diarrhea and state of dehydration causing AKI.  On CTX. Per primary.  4.  Diabetes: CBGs well controlled with insulin.  Per primary.  5.  Hypertension: Remains normotensive with mild tachycardia today. Known E coli. infection.  6.  Hyponatremia:  Resolved. Will monitor.  6.  Anemia:  Suspect there may be a component of dilution given aggressive fluid resuscitation during hospitalization.  Stool has been without gross blood.  Will check FOBT. S/p 2 u pRBCs 4/20.  Subjective:   Patient doing well today. No new complaints. Says his bowel movement have more formed. Planningto ambulate later today.   Objective:   BP (!) 155/55 (BP Location: Right Leg)   Pulse 92   Temp 100.2 F (37.9 C) (Oral)   Resp 17   SpO2 94%   Intake/Output Summary (Last 24 hours) at 12/05/2017 0753 Last data filed at 12/04/2017 1823 Gross per 24 hour  Intake 583.75 ml  Output 950 ml  Net -366.25 ml   Weight change:   Physical Exam: General: emaciated male, NAD with non-toxic appearance HEENT: normocephalic, atraumatic, moist mucous membranes Cardiovascular: regular rate and rhythm without murmurs, rubs, or gallops Lungs: clear to auscultation bilaterally with normal work of breathing Abdomen: soft, non-tender, non-distended, normoactive bowel sounds GU: clear straw/yellow urine in foley bag Skin: warm, dry, no rashes or  lesions, cap refill < 2 seconds Extremities: warm and well perfused, normal tone, no edema  Imaging: No results found.  Labs: BMET Recent Labs  Lab 11/30/17 0710 11/30/17 1811 12/01/17 0928 12/02/17 0819 12/03/17 0611 12/04/17 0636 12/05/17 0428  NA 134* 132* 136 139 138 137 135  K 3.6 3.5 3.5 3.1* 3.0* 3.2* 3.0*  CL 104 99* 97* 93* 88* 91* 95*  CO2 19* 20* 25 31 37* 33* 30  GLUCOSE 143* 245* 187* 161* 119* 121* 105*  BUN 75* 67* 63* 47* 35* 31* 20  CREATININE 7.27* 6.95* 6.25* 5.10* 4.52* 4.29* 3.74*  CALCIUM 9.6 9.6 9.3 9.0 8.6* 8.4* 7.9*   CBC Recent Labs  Lab 11/29/17 1855 12/03/17 0611 12/04/17 0636  WBC 6.4 4.3 5.5  NEUTROABS 4.0  --   --   HGB 8.1* 6.1* 8.2*  HCT 25.3* 20.1* 26.4*  MCV 81.6 85.5 84.9  PLT 117* 79* 71*    Medications:    . aspirin EC  81 mg Oral Daily  . calcitRIOL  0.5 mcg Oral Daily  . dapsone  50 mg Oral Daily  . folic acid  1 mg Oral Daily  . insulin aspart  0-15 Units Subcutaneous TID WC  . insulin aspart  0-5 Units Subcutaneous QHS  . insulin glargine  5 Units Subcutaneous QHS  . mycophenolate  720 mg Oral BID  . sodium bicarbonate  1,300 mg Oral TID  . tacrolimus  2.5 mg Oral QHS  . tacrolimus  3 mg Oral Daily  Harriet Butte, DO, PGY-2

## 2017-12-06 LAB — CBC WITH DIFFERENTIAL/PLATELET
Basophils Absolute: 0 10*3/uL (ref 0.0–0.1)
Basophils Relative: 0 %
Eosinophils Absolute: 0.6 10*3/uL (ref 0.0–0.7)
Eosinophils Relative: 11 %
HCT: 26.1 % — ABNORMAL LOW (ref 39.0–52.0)
Hemoglobin: 8.3 g/dL — ABNORMAL LOW (ref 13.0–17.0)
Lymphocytes Relative: 14 %
Lymphs Abs: 0.7 10*3/uL (ref 0.7–4.0)
MCH: 27.3 pg (ref 26.0–34.0)
MCHC: 31.8 g/dL (ref 30.0–36.0)
MCV: 85.9 fL (ref 78.0–100.0)
Monocytes Absolute: 1.2 10*3/uL — ABNORMAL HIGH (ref 0.1–1.0)
Monocytes Relative: 25 %
Neutro Abs: 2.5 10*3/uL (ref 1.7–7.7)
Neutrophils Relative %: 50 %
Platelets: 82 10*3/uL — ABNORMAL LOW (ref 150–400)
RBC: 3.04 MIL/uL — ABNORMAL LOW (ref 4.22–5.81)
RDW: 16.1 % — ABNORMAL HIGH (ref 11.5–15.5)
WBC: 5 10*3/uL (ref 4.0–10.5)

## 2017-12-06 LAB — GLUCOSE, CAPILLARY
GLUCOSE-CAPILLARY: 129 mg/dL — AB (ref 65–99)
Glucose-Capillary: 137 mg/dL — ABNORMAL HIGH (ref 65–99)
Glucose-Capillary: 128 mg/dL — ABNORMAL HIGH (ref 65–99)
Glucose-Capillary: 176 mg/dL — ABNORMAL HIGH (ref 65–99)

## 2017-12-06 LAB — BASIC METABOLIC PANEL
ANION GAP: 10 (ref 5–15)
BUN: 15 mg/dL (ref 6–20)
CO2: 26 mmol/L (ref 22–32)
Calcium: 8 mg/dL — ABNORMAL LOW (ref 8.9–10.3)
Chloride: 99 mmol/L — ABNORMAL LOW (ref 101–111)
Creatinine, Ser: 3.3 mg/dL — ABNORMAL HIGH (ref 0.61–1.24)
GFR calc Af Amer: 24 mL/min — ABNORMAL LOW (ref >60)
GFR, EST NON AFRICAN AMERICAN: 20 mL/min — AB (ref >60)
GLUCOSE: 117 mg/dL — AB (ref 65–99)
Potassium: 3.8 mmol/L (ref 3.5–5.1)
Sodium: 135 mmol/L (ref 135–145)

## 2017-12-06 LAB — IRON AND TIBC
Iron: 24 ug/dL — ABNORMAL LOW (ref 45–182)
Saturation Ratios: 13 % — ABNORMAL LOW (ref 17.9–39.5)
TIBC: 185 ug/dL — ABNORMAL LOW (ref 250–450)
UIBC: 161 ug/dL

## 2017-12-06 LAB — TACROLIMUS LEVEL: Tacrolimus (FK506) - LabCorp: 2.4 ng/mL (ref 2.0–20.0)

## 2017-12-06 LAB — MAGNESIUM: MAGNESIUM: 1.3 mg/dL — AB (ref 1.7–2.4)

## 2017-12-06 LAB — FERRITIN: Ferritin: 2380 ng/mL — ABNORMAL HIGH (ref 24–336)

## 2017-12-06 MED ORDER — TACROLIMUS 1 MG PO CAPS
5.0000 mg | ORAL_CAPSULE | Freq: Every day | ORAL | Status: DC
  Administered 2017-12-06: 5 mg via ORAL
  Filled 2017-12-06: qty 5

## 2017-12-06 MED ORDER — TACROLIMUS 1 MG PO CAPS
6.0000 mg | ORAL_CAPSULE | Freq: Every day | ORAL | Status: DC
  Administered 2017-12-07: 6 mg via ORAL
  Filled 2017-12-06: qty 6

## 2017-12-06 MED ORDER — MAGNESIUM SULFATE 2 GM/50ML IV SOLN
2.0000 g | Freq: Once | INTRAVENOUS | Status: AC
  Administered 2017-12-06: 2 g via INTRAVENOUS
  Filled 2017-12-06: qty 50

## 2017-12-06 NOTE — Progress Notes (Signed)
[facility] Progress Note    Assessment/ Plan:   1.  Acute kidney injury with ESRD s/p kidney transplant: Likely prerenal d/t E coli infection. Cr 3.3 (BL 2-3).  UOP -0.6 L last 24 hours.  Diarrhea found to be related to E coli infection.  No signs of acute renal transplant rejection.  On Myfortic.  We will continue Prograf at decrease dose given diarrhea.  There are no indications for dialysis at present.  2.  Non-anion gap metabolic acidosis: Likely from diarrheal illness.  Resolve s/p bicarb gtt.  3.  Enteropathogenic E coli: Source of diarrhea and state of dehydration causing AKI.  On CTX. Per primary.  4.  Diabetes: CBGs well controlled with insulin.  Per primary.  5.  Hypertension: Normotensive.  Per primary.  6.  Hyponatremia:  Resolved.  Will monitor.  7. Anemia of chronic inflammation:  Suspect there may be a component of dilution given fluid resuscitation during hospitalization.  Stool has been without gross blood. S/p 2 u pRBCs 4/20.  8. Thrombocytopenia:  Improving.  Appears chronic.  May also have dilutional component this hospitalization.  Subjective:   Patient states he is doing well today. No new complaints. Diarrhea improving.   Objective:   BP (!) 140/127 (BP Location: Right Wrist)   Pulse 98   Temp 98.6 F (37 C) (Oral)   Resp 18   SpO2 98%   Intake/Output Summary (Last 24 hours) at 12/06/2017 0816 Last data filed at 12/05/2017 1830 Gross per 24 hour  Intake 586 ml  Output 600 ml  Net -14 ml   Weight change:   Physical Exam: General: emaciated male, NAD with non-toxic appearance HEENT: normocephalic, atraumatic, moist mucous membranes Cardiovascular: regular rate and rhythm without murmurs, rubs, or gallops Lungs: clear to auscultation bilaterally with normal work of breathing Abdomen: soft, non-tender, non-distended, normoactive bowel sounds GU: clear straw/yellow urine in foley bag Skin: warm, dry, no rashes or lesions, cap refill <  2 seconds Extremities: warm and well perfused, normal tone, no edema  Imaging: No results found.  Labs: BMET Recent Labs  Lab 11/30/17 1811 12/01/17 9702 12/02/17 0819 12/03/17 0611 12/04/17 0636 12/05/17 0428 12/06/17 0349  NA 132* 136 139 138 137 135 135  K 3.5 3.5 3.1* 3.0* 3.2* 3.0* 3.8  CL 99* 97* 93* 88* 91* 95* 99*  CO2 20* 25 31 37* 33* 30 26  GLUCOSE 245* 187* 161* 119* 121* 105* 117*  BUN 67* 63* 47* 35* 31* 20 15  CREATININE 6.95* 6.25* 5.10* 4.52* 4.29* 3.74* 3.30*  CALCIUM 9.6 9.3 9.0 8.6* 8.4* 7.9* 8.0*   CBC Recent Labs  Lab 11/29/17 1855 12/03/17 0611 12/04/17 0636 12/05/17 0428 12/06/17 0349  WBC 6.4 4.3 5.5 4.9 5.0  NEUTROABS 4.0  --   --  2.3 2.5  HGB 8.1* 6.1* 8.2* 7.8* 8.3*  HCT 25.3* 20.1* 26.4* 24.3* 26.1*  MCV 81.6 85.5 84.9 84.1 85.9  PLT 117* 79* 71* 73* 82*    Medications:    . aspirin EC  81 mg Oral Daily  . calcitRIOL  0.5 mcg Oral Daily  . dapsone  50 mg Oral Daily  . folic acid  1 mg Oral Daily  . insulin aspart  0-15 Units Subcutaneous TID WC  . insulin aspart  0-5 Units Subcutaneous QHS  . insulin glargine  5 Units Subcutaneous QHS  . mycophenolate  720 mg Oral BID  . potassium chloride  40 mEq Oral BID  . tacrolimus  2.5  mg Oral QHS  . tacrolimus  3 mg Oral Daily     Harriet Butte, DO, PGY-2

## 2017-12-06 NOTE — Progress Notes (Signed)
Physical Therapy Treatment Patient Details Name: [name] MRN: [id number] DOB: 1971/03/04 Today's Date: 12/06/2017    History of Present Illness 47yo male who presented to the ED with diarrhea, nausea. Diagnosed with ARD with stage 5 CKD, ecoli infection following a meal at a [employer]. PMH CHF, DM, ESRD on HD, HTN, kidney transplant, cardiomyopathy, recent back surgery     PT Comments    Patient is progressing well toward PT goals. Current plan remains appropriate.    Follow Up Recommendations  Home health PT;Supervision/Assistance - 24 hour     Equipment Recommendations  None recommended by PT(pt reported having RW at home)    Recommendations for Other Services       Precautions / Restrictions Precautions Precautions: Fall;Back Precaution Booklet Issued: No Precaution Comments: reviewed precautions with pt; pt unable to recall precautions Restrictions Weight Bearing Restrictions: No    Mobility  Bed Mobility Overal bed mobility: Modified Independent             General bed mobility comments: pt used log roll technique and HOB elevated slightly; pt reported having adjustable bed at home  Transfers Overall transfer level: Needs assistance Equipment used: None Transfers: Sit to/from [facility] Sit to Stand: Min guard         General transfer comment: for safety  Ambulation/Gait Ambulation/Gait assistance: Supervision Ambulation Distance (Feet): 65 Feet Assistive device: Straight cane Gait Pattern/deviations: Step-through pattern;Decreased stride length     General Gait Details: pt with decreased cadence and guarded with movement; unsteadiness noted but no LOB or need for physical assitance   Stairs             Wheelchair Mobility    Modified Rankin (Stroke Patients Only)       Balance Overall balance assessment: Needs assistance Sitting-balance support: Feet supported;Bilateral upper extremity  supported Sitting balance-Leahy Scale: Good     Standing balance support: No upper extremity supported;During functional activity Standing balance-Leahy Scale: Fair                              Cognition Arousal/Alertness: Awake/alert Behavior During Therapy: WFL for tasks assessed/performed Overall Cognitive Status: Within Functional Limits for tasks assessed                                 General Comments: self limiting behavior      Exercises      General Comments General comments (skin integrity, edema, etc.): wife present and answering most questions      Pertinent Vitals/Pain Pain Assessment: No/denies pain    Home Living                      Prior Function            PT Goals (current goals can now be found in the care plan section) Acute Rehab PT Goals Patient Stated Goal: to go home, resume PT for his back  PT Goal Formulation: With patient/family Time For Goal Achievement: 12/19/17 Potential to Achieve Goals: Good Progress towards PT goals: Progressing toward goals    Frequency    Min 3X/week      PT Plan Current plan remains appropriate    Co-evaluation              AM-PAC PT "6 Clicks" Daily Activity  Outcome Measure  Difficulty turning over in  bed (including adjusting bedclothes, sheets and blankets)?: A Lot Difficulty moving from lying on back to sitting on the side of the bed? : A Lot Difficulty sitting down on and standing up from a chair with arms (e.g., wheelchair, bedside commode, etc,.)?: Unable Help needed moving to and from a bed to chair (including a wheelchair)?: A Little Help needed walking in hospital room?: A Little Help needed climbing 3-5 steps with a railing? : A Lot 6 Click Score: 13    End of Session Equipment Utilized During Treatment: Gait belt Activity Tolerance: Patient tolerated treatment well Patient left: in bed;with call bell/phone within reach;with family/visitor  present Nurse Communication: Mobility status PT Visit Diagnosis: Unsteadiness on feet (R26.81);Muscle weakness (generalized) (M62.81);Difficulty in walking, not elsewhere classified (R26.2);History of falling (Z91.81)     Time: [id number] PT Time Calculation (min) (ACUTE ONLY): 16 min  Charges:  $Gait Training: 8-22 mins                    G Codes:       Earney Navy, PTA Pager: [phone]     Darliss Cheney 12/06/2017, 4:27 PM

## 2017-12-06 NOTE — Progress Notes (Signed)
PROGRESS NOTE    [name]  AYT:[id number] DOB: 1970-08-26 DOA: 11/29/2017 PCP: Patient, No Pcp Per    Brief Narrative:47 year old African-American male with history of ESRD status post renal transplant, hypertension, and poorly controlled type 2 diabetes who presented to the emergency department with diarrhea that is now diagnosed secondary to enteropathogenic E. coli after eating at [employer]. As a result, he has become dehydrated and has developed AK I on stage V CKD with nephrology following. He has been started on Rocephin for his E. coli diarrhea with slow improvement noted.     Assessment & Plan:   Principal Problem:   Acute renal failure superimposed on stage 5 chronic kidney disease, not on chronic dialysis (HCC) Active Problems:   DM2 (diabetes mellitus, type 2) (HCC)   Essential hypertension, benign   Deceased-donor kidney transplant   Hyponatremia   Diarrhea   Metabolic acidosis, NAG, failure of bicarbonate regeneration  1. AK I on CKD stage V-not on dialysis with prior transplant. This is noted to be secondary to dehydration from his diarrhea. Creatinine appears to be downtrending with acidosis that appears to have resolved.Baseline creatinine noted to be 2-3. He is currently at 3.30.  Nephrology following and feels that he could be discharged home in 1-2 days.  2. Diarrhea secondary to enteropathogenic E. Coli-patient reports having no further diarrhea for the last 3 days.  Last dose of Rocephin 12/07/2017.Marland Kitchen  He has had formed stools 3] hypertension. Stable blood pressure readings currently noted. 3. Hyponatremia. Resolved. Continue to monitor. 4. 5]anemiaStatus post 2 units of blood transfusion. 5. Hypokalemia hypomagnesemia being repleted.       DVT prophylaxis: scd Code Status:full Family Communication:none Disposition Plan: Plan discharge in 1-2 days as long as his creatinine is trending down and when nephrology is okay with the  discharge.  Consultants: Nephrology  Procedures: None Antimicrobials:  rocephin Subjective: Feels better no complaints  Objective: Vitals:   12/05/17 1352 12/05/17 2203 12/06/17 0536 12/06/17 1343  BP: (!) 154/78 (!) 158/87 (!) 140/127 137/83  Pulse: 86 98 98 100  Resp: 17 16 18 20   Temp: 98.5 F (36.9 C) 100.1 F (37.8 C) 98.6 F (37 C) 99.4 F (37.4 C)  TempSrc: Axillary Oral Oral Oral  SpO2: 95% 95% 98% 98%    Intake/Output Summary (Last 24 hours) at 12/06/2017 1401 Last data filed at 12/05/2017 1830 Gross per 24 hour  Intake 222 ml  Output 600 ml  Net -378 ml   There were no vitals filed for this visit.  Examination:  General exam: Appears calm and comfortable  Respiratory system: Clear to auscultation. Respiratory effort normal. Cardiovascular system: S1 & S2 heard, RRR. No JVD, murmurs, rubs, gallops or clicks. No pedal edema. Gastrointestinal system: Abdomen is nondistended, soft and nontender. No organomegaly or masses felt. Normal bowel sounds heard. Central nervous system: Alert and oriented. No focal neurological deficits. Extremities: Symmetric 5 x 5 power. Skin: No rashes, lesions or ulcers Psychiatry: Judgement and insight appear normal. Mood & affect appropriate.     Data Reviewed: I have personally reviewed following labs and imaging studies  CBC: Recent Labs  Lab 11/29/17 1855 12/03/17 0611 12/04/17 0636 12/05/17 0428 12/06/17 0349  WBC 6.4 4.3 5.5 4.9 5.0  NEUTROABS 4.0  --   --  2.3 2.5  HGB 8.1* 6.1* 8.2* 7.8* 8.3*  HCT 25.3* 20.1* 26.4* 24.3* 26.1*  MCV 81.6 85.5 84.9 84.1 85.9  PLT 117* 79* 71* 73* 82*   Basic Metabolic Panel: Recent  Labs  Lab 12/02/17 0819 12/03/17 0611 12/04/17 0636 12/05/17 0428 12/06/17 0349  NA 139 138 137 135 135  K 3.1* 3.0* 3.2* 3.0* 3.8  CL 93* 88* 91* 95* 99*  CO2 31 37* 33* 30 26  GLUCOSE 161* 119* 121* 105* 117*  BUN 47* 35* 31* 20 15  CREATININE 5.10* 4.52* 4.29* 3.74* 3.30*  CALCIUM 9.0  8.6* 8.4* 7.9* 8.0*  MG  --   --   --  0.7* 1.3*   GFR: CrCl cannot be calculated (Unknown ideal weight.). Liver Function Tests: Recent Labs  Lab 11/29/17 1855  AST 12*  ALT 12*  ALKPHOS 94  BILITOT 0.6  PROT 6.9  ALBUMIN 4.3   Recent Labs  Lab 11/29/17 1855  LIPASE 39   No results for input(s): AMMONIA in the last 168 hours. Coagulation Profile: No results for input(s): INR, PROTIME in the last 168 hours. Cardiac Enzymes: No results for input(s): CKTOTAL, CKMB, CKMBINDEX, TROPONINI in the last 168 hours. BNP (last 3 results) No results for input(s): PROBNP in the last 8760 hours. HbA1C: No results for input(s): HGBA1C in the last 72 hours. CBG: Recent Labs  Lab 12/05/17 1203 12/05/17 1658 12/05/17 2204 12/06/17 0800 12/06/17 1157  GLUCAP 119* 170* 140* 129* 137*   Lipid Profile: No results for input(s): CHOL, HDL, LDLCALC, TRIG, CHOLHDL, LDLDIRECT in the last 72 hours. Thyroid Function Tests: No results for input(s): TSH, T4TOTAL, FREET4, T3FREE, THYROIDAB in the last 72 hours. Anemia Panel: Recent Labs    12/06/17 0349  FERRITIN 2,380*  TIBC 185*  IRON 24*   Sepsis Labs: Recent Labs  Lab 11/29/17 2334 11/30/17 0257  LATICACIDVEN 2.0* 1.2    Recent Results (from the past 240 hour(s))  Gastrointestinal Panel by PCR , Stool     Status: Abnormal   Collection Time: 11/30/17 10:29 AM  Result Value Ref Range Status   Campylobacter species NOT DETECTED NOT DETECTED Final   Plesimonas shigelloides NOT DETECTED NOT DETECTED Final   Salmonella species NOT DETECTED NOT DETECTED Final   Yersinia enterocolitica NOT DETECTED NOT DETECTED Final   Vibrio species NOT DETECTED NOT DETECTED Final   Vibrio cholerae NOT DETECTED NOT DETECTED Final   Enteroaggregative E coli (EAEC) NOT DETECTED NOT DETECTED Final   Enteropathogenic E coli (EPEC) DETECTED (A) NOT DETECTED Final    Comment: RESULT CALLED TO, READ BACK BY AND VERIFIED WITH: TJ SEXTON 11/30/17 1921  KLW    Enterotoxigenic E coli (ETEC) NOT DETECTED NOT DETECTED Final   Shiga like toxin producing E coli (STEC) NOT DETECTED NOT DETECTED Final   Shigella/Enteroinvasive E coli (EIEC) NOT DETECTED NOT DETECTED Final   Cryptosporidium NOT DETECTED NOT DETECTED Final   Cyclospora cayetanensis NOT DETECTED NOT DETECTED Final   Entamoeba histolytica NOT DETECTED NOT DETECTED Final   Giardia lamblia NOT DETECTED NOT DETECTED Final   Adenovirus F40/41 NOT DETECTED NOT DETECTED Final   Astrovirus NOT DETECTED NOT DETECTED Final   Norovirus GI/GII NOT DETECTED NOT DETECTED Final   Rotavirus A NOT DETECTED NOT DETECTED Final   Sapovirus (I, II, IV, and V) NOT DETECTED NOT DETECTED Final    Comment: Performed at [facility], [address] [place], [place] [address]  C difficile quick scan w PCR reflex     Status: None   Collection Time: 11/30/17 10:29 AM  Result Value Ref Range Status   C Diff antigen NEGATIVE NEGATIVE Final   C Diff toxin NEGATIVE NEGATIVE Final  C Diff interpretation No C. difficile detected.  Final  MRSA PCR Screening     Status: None   Collection Time: 12/01/17  4:57 AM  Result Value Ref Range Status   MRSA by PCR NEGATIVE NEGATIVE Final    Comment:        The GeneXpert MRSA Assay (FDA approved for NASAL specimens only), is one component of a comprehensive MRSA colonization surveillance program. It is not intended to diagnose MRSA infection nor to guide or monitor treatment for MRSA infections. Performed at [facility], [place] [address] [place], [place] [address]          Radiology Studies: No results found.      Scheduled Meds: . aspirin EC  81 mg Oral Daily  . calcitRIOL  0.5 mcg Oral Daily  . dapsone  50 mg Oral Daily  . folic acid  1 mg Oral Daily  . insulin aspart  0-15 Units Subcutaneous TID WC  . insulin aspart  0-5 Units Subcutaneous QHS  . insulin glargine  5 Units Subcutaneous QHS  . mycophenolate  720 mg Oral BID  .  potassium chloride  40 mEq Oral BID  . tacrolimus  2.5 mg Oral QHS  . tacrolimus  3 mg Oral Daily   Continuous Infusions: . cefTRIAXone (ROCEPHIN)  IV Stopped (12/05/17 2033)     LOS: 7 days     Georgette Shell, MD  If 7PM-7AM, please contact night-coverage [email or web address] Password [facility] 12/06/2017, 2:01 PM

## 2017-12-07 LAB — CBC WITH DIFFERENTIAL/PLATELET
BASOS ABS: 0 10*3/uL (ref 0.0–0.1)
BASOS PCT: 0 %
Eosinophils Absolute: 0.6 10*3/uL (ref 0.0–0.7)
Eosinophils Relative: 14 %
HEMATOCRIT: 26.9 % — AB (ref 39.0–52.0)
Hemoglobin: 8.4 g/dL — ABNORMAL LOW (ref 13.0–17.0)
Lymphocytes Relative: 19 %
Lymphs Abs: 0.9 10*3/uL (ref 0.7–4.0)
MCH: 26.8 pg (ref 26.0–34.0)
MCHC: 31.2 g/dL (ref 30.0–36.0)
MCV: 85.9 fL (ref 78.0–100.0)
Monocytes Absolute: 1.1 10*3/uL — ABNORMAL HIGH (ref 0.1–1.0)
Monocytes Relative: 23 %
NEUTROS ABS: 2 10*3/uL (ref 1.7–7.7)
NEUTROS PCT: 44 %
Platelets: 81 10*3/uL — ABNORMAL LOW (ref 150–400)
RBC: 3.13 MIL/uL — AB (ref 4.22–5.81)
RDW: 16.1 % — ABNORMAL HIGH (ref 11.5–15.5)
WBC: 4.5 10*3/uL (ref 4.0–10.5)

## 2017-12-07 LAB — BASIC METABOLIC PANEL
Anion gap: 10 (ref 5–15)
BUN: 15 mg/dL (ref 6–20)
CO2: 25 mmol/L (ref 22–32)
Calcium: 8.7 mg/dL — ABNORMAL LOW (ref 8.9–10.3)
Chloride: 100 mmol/L — ABNORMAL LOW (ref 101–111)
Creatinine, Ser: 3.02 mg/dL — ABNORMAL HIGH (ref 0.61–1.24)
GFR calc Af Amer: 26 mL/min — ABNORMAL LOW (ref >60)
GFR, EST NON AFRICAN AMERICAN: 23 mL/min — AB (ref >60)
Glucose, Bld: 117 mg/dL — ABNORMAL HIGH (ref 65–99)
POTASSIUM: 4 mmol/L (ref 3.5–5.1)
SODIUM: 135 mmol/L (ref 135–145)

## 2017-12-07 LAB — MAGNESIUM: MAGNESIUM: 2 mg/dL (ref 1.7–2.4)

## 2017-12-07 LAB — GLUCOSE, CAPILLARY
Glucose-Capillary: 98 mg/dL (ref 65–99)
Glucose-Capillary: 122 mg/dL — ABNORMAL HIGH (ref 65–99)

## 2017-12-07 MED ORDER — ACCU-CHEK MULTICLIX LANCETS MISC: 5 refills | Status: DC

## 2017-12-07 MED ORDER — INSULIN ASPART 100 UNIT/ML FLEXPEN: 12.0000 [IU] | PEN_INJECTOR | Freq: Three times a day (TID) | SUBCUTANEOUS | 1 refills | Status: DC

## 2017-12-07 MED ORDER — INSULIN GLARGINE 100 UNIT/ML ~~LOC~~ SOLN: 5.0000 [IU] | Freq: Every day | SUBCUTANEOUS | 11 refills | Status: DC

## 2017-12-07 MED ORDER — ONDANSETRON HCL 4 MG PO TABS: 4.0000 mg | ORAL_TABLET | Freq: Four times a day (QID) | ORAL | 0 refills | Status: DC | PRN

## 2017-12-07 MED ORDER — INSULIN GLARGINE 100 UNIT/ML SOLOSTAR PEN: 5.0000 [IU] | PEN_INJECTOR | Freq: Every day | SUBCUTANEOUS | 11 refills | Status: DC

## 2017-12-07 MED ORDER — INSULIN PEN NEEDLE 31G X 5 MM MISC: 5.0000 [IU]/[oz_av]/d | Freq: Every day | 0 refills | Status: DC

## 2017-12-07 MED ORDER — GLUCOSE BLOOD VI STRP: ORAL_STRIP | 5 refills | Status: DC

## 2017-12-07 NOTE — Care Management Note (Signed)
Case Management Note  Patient Details  Name: [name] MRN: [id number] Date of Birth: 09-Feb-1971  Subjective/Objective:      AKI              Action/Plan: Transition to home. Wife to provide transportation to home.  Expected Discharge Date:  12/07/17               Expected Discharge Plan:  Home/Self Care  In-House Referral:     Discharge planning Services  CM Consult  Post Acute Care Choice:    Choice offered to:     DME Arranged:   n/a DME Agency:   n/a  HH Arranged:    [facility] Agency:    pt declined home health PT. PTA pt was setup for outpatient PT per Dr.Webb's office.  Status of Service:  Completed, signed off  If discussed at [facility] of Stay Meetings, dates discussed:    Additional Comments:  Sharin Mons, RN 12/07/2017, 3:03 PM

## 2017-12-07 NOTE — Discharge Summary (Signed)
Physician Discharge Summary  [name] JJO:[id number] DOB: 1970/09/17 DOA: 11/29/2017  PCP: Patient, No Pcp Per Admit date: 11/29/2017 Discharge date: 12/07/2017  Admitted From: home Disposition:  home Recommendations for Outpatient Follow-up:  1. Follow up with PCP in 1-2 weeks 2. Please obtain BMP/CBC in one week:  Home Health yes Equipment/Devices none Discharge Condition stable CODE STATUS:full Diet recommendation:renal Brief/Interim Summary:47 year old African-American male with history of ESRD status post renal transplant, hypertension, and poorly controlled type 2 diabetes who presented to the emergency department with diarrhea that is now diagnosed secondary to enteropathogenic E. coli after eating at [employer]. As a result, he has become dehydrated and has developed AK I on stage V CKD with nephrology following. He has been started on Rocephin for his E. coli diarrhea with slow improvement noted.     Discharge Diagnoses:  Principal Problem:   Acute renal failure superimposed on stage 5 chronic kidney disease, not on chronic dialysis (HCC) Active Problems:   DM2 (diabetes mellitus, type 2) (HCC)   Essential hypertension, benign   Deceased-donor kidney transplant   Hyponatremia   Diarrhea   Metabolic acidosis, NAG, failure of bicarbonate regeneration  1. AK I on CKD stage V-not on dialysis with prior transplant. This is noted to be secondary to dehydration from his diarrhea.  His renal functions improved with IV hydration.  On the day of discharge his creatinine is 3.02.  Patient was followed by nephrology team. 2. Diarrhea secondary to enteropathogenic E. Coli-his symptoms improved with Rocephin treatment for 6 days. he has not had diarrhea for the last 3 days.  3. Essential hypertension. Stable blood pressure readings currently noted. 4. Hyponatremia/hypomagnesemia resolved.  5. anemia with a drop in hemoglobin from 8-6.1. Partly from dilution.Status post 2 units  of blood transfusion.  Hemoglobin remained stable.  FOBT negative.       Discharge Instructions  Discharge Instructions    Call MD for:  difficulty breathing, headache or visual disturbances   Complete by:  As directed    Call MD for:  persistant dizziness or light-headedness   Complete by:  As directed    Call MD for:  persistant nausea and vomiting   Complete by:  As directed    Call MD for:  redness, tenderness, or signs of infection (pain, swelling, redness, odor or green/yellow discharge around incision site)   Complete by:  As directed    Call MD for:  severe uncontrolled pain   Complete by:  As directed    Call MD for:  temperature >100.4   Complete by:  As directed    Diet - low sodium heart healthy   Complete by:  As directed    Increase activity slowly   Complete by:  As directed      Allergies as of 12/07/2017      Reactions   Lactose Intolerance (gi) Diarrhea   No whole milk!!   Other Other (See Comments)   Soy milk burns his tongue   Pollen Extract Other (See Comments)   Itching, watery eyes and sneezing.   Tape Rash   PLEASE USE COBAN WRAP OR PAPER TAPE       Medication List    STOP taking these medications   amoxicillin 500 MG tablet Commonly known as:  AMOXIL   bismuth subsalicylate 160 FU/93AT suspension Commonly known as:  PEPTO BISMOL   carvedilol 12.5 MG tablet Commonly known as:  COREG   loperamide 2 MG tablet Commonly known as:  IMODIUM A-D  magnesium oxide 400 MG tablet Commonly known as:  MAG-OX   omeprazole 20 MG capsule Commonly known as:  PRILOSEC   TRESIBA FLEXTOUCH 200 UNIT/ML Sopn Generic drug:  Insulin Degludec     TAKE these medications   ACCU-CHEK NANO SMARTVIEW w/Device Kit Use as instructed   acetaminophen 500 MG tablet Commonly known as:  TYLENOL Take 1,000 mg by mouth every 6 (six) hours as needed for mild pain.   aspirin EC 81 MG tablet Take 81 mg by mouth daily.   calcitRIOL 0.5 MCG capsule Commonly  known as:  ROCALTROL Take 1 capsule by mouth daily.   calcium carbonate 1250 (500 Ca) MG tablet Commonly known as:  OS-CAL - dosed in mg of elemental calcium Take 1 tablet by mouth 3 (three) times daily with meals.   dapsone 25 MG tablet Take 50 mg by mouth daily.   folic acid 1 MG tablet Commonly known as:  FOLVITE Take 1 mg by mouth daily.   freestyle lancets Check blood sugar 4 times a day before meals and bed. diag code E11.9. Insulin dependent   glucose blood test strip Commonly known as:  FREESTYLE LITE Check blood sugar 4 times a day before meals and bed. diag code E11.9. Insulin dependent   HYDROcodone-acetaminophen 5-325 MG tablet Commonly known as:  NORCO/VICODIN Take 1 tablet by mouth every 6 (six) hours as needed for moderate pain.   insulin aspart 100 UNIT/ML FlexPen Commonly known as:  NOVOLOG Inject 12 units with breakfast, 12 units with lunch, 12 units with supper What changed:    how much to take  how to take this  when to take this  additional instructions   insulin glargine 100 UNIT/ML injection Commonly known as:  LANTUS Inject 0.05 mLs (5 Units total) into the skin at bedtime.   ketoconazole 2 % shampoo Commonly known as:  NIZORAL Apply topically 2 (two) times a week. Wash hair and beard.  Leave shampoo on skin for 5 mins before washing off.   mycophenolate 180 MG EC tablet Commonly known as:  MYFORTIC Take 720 mg by mouth See admin instructions. 0800, 2000   ondansetron 4 MG tablet Commonly known as:  ZOFRAN Take 4 mg by mouth daily as needed for indigestion. What changed:  Another medication with the same name was added. Make sure you understand how and when to take each.   ondansetron 4 MG tablet Commonly known as:  ZOFRAN Take 1 tablet (4 mg total) by mouth every 6 (six) hours as needed for nausea. What changed:  You were already taking a medication with the same name, and this prescription was added. Make sure you understand how and  when to take each.   Pen Needles 31G X 8 MM Misc Use 4 times daily to inject insulin into the skin. diag codeE11.9. Insulin dependent   sodium bicarbonate 650 MG tablet Take 1,300 mg by mouth 3 (three) times daily.   tacrolimus 1 MG capsule Commonly known as:  PROGRAF Take 5-6 mg by mouth See admin instructions. 6 mg in the morning and 5 mg in the evening - 1000, 2200      Follow-up Information    Roney Jaffe, MD Follow up.   Specialty:  Nephrology Contact information: [place] [address] [phone]          Allergies  Allergen Reactions  . Lactose Intolerance (Gi) Diarrhea    No whole milk!!  . Other Other (See Comments)    Soy  milk burns his tongue  . Pollen Extract Other (See Comments)    Itching, watery eyes and sneezing.  . Tape Rash    PLEASE USE COBAN WRAP OR PAPER TAPE     Consultations: nephrology  Procedures/Studies:  No results found. (Echo, Carotid, EGD, Colonoscopy, ERCP)    Subjective:   Discharge Exam: Vitals:   12/06/17 2146 12/07/17 0526  BP: 135/81 (!) 158/91  Pulse: 85 97  Resp: 17 17  Temp: 98.4 F (36.9 C) 99.5 F (37.5 C)  SpO2: 98% 97%   Vitals:   12/06/17 0536 12/06/17 1343 12/06/17 2146 12/07/17 0526  BP: (!) 140/127 137/83 135/81 (!) 158/91  Pulse: 98 100 85 97  Resp: 18 20 17 17   Temp: 98.6 F (37 C) 99.4 F (37.4 C) 98.4 F (36.9 C) 99.5 F (37.5 C)  TempSrc: Oral Oral Oral Oral  SpO2: 98% 98% 98% 97%  Weight:   58.2 kg (128 lb 4.9 oz)     General: Pt is alert, awake, not in acute distress Cardiovascular: RRR, S1/S2 +, no rubs, no gallops Respiratory: CTA bilaterally, no wheezing, no rhonchi Abdominal: Soft, NT, ND, bowel sounds + Extremities: no edema, no cyanosis    The results of significant diagnostics from this hospitalization (including imaging, microbiology, ancillary and laboratory) are listed below for reference.     Microbiology: Recent Results (from the past 240 hour(s))   Gastrointestinal Panel by PCR , Stool     Status: Abnormal   Collection Time: 11/30/17 10:29 AM  Result Value Ref Range Status   Campylobacter species NOT DETECTED NOT DETECTED Final   Plesimonas shigelloides NOT DETECTED NOT DETECTED Final   Salmonella species NOT DETECTED NOT DETECTED Final   Yersinia enterocolitica NOT DETECTED NOT DETECTED Final   Vibrio species NOT DETECTED NOT DETECTED Final   Vibrio cholerae NOT DETECTED NOT DETECTED Final   Enteroaggregative E coli (EAEC) NOT DETECTED NOT DETECTED Final   Enteropathogenic E coli (EPEC) DETECTED (A) NOT DETECTED Final    Comment: RESULT CALLED TO, READ BACK BY AND VERIFIED WITH: TJ SEXTON 11/30/17 1921 KLW    Enterotoxigenic E coli (ETEC) NOT DETECTED NOT DETECTED Final   Shiga like toxin producing E coli (STEC) NOT DETECTED NOT DETECTED Final   Shigella/Enteroinvasive E coli (EIEC) NOT DETECTED NOT DETECTED Final   Cryptosporidium NOT DETECTED NOT DETECTED Final   Cyclospora cayetanensis NOT DETECTED NOT DETECTED Final   Entamoeba histolytica NOT DETECTED NOT DETECTED Final   Giardia lamblia NOT DETECTED NOT DETECTED Final   Adenovirus F40/41 NOT DETECTED NOT DETECTED Final   Astrovirus NOT DETECTED NOT DETECTED Final   Norovirus GI/GII NOT DETECTED NOT DETECTED Final   Rotavirus A NOT DETECTED NOT DETECTED Final   Sapovirus (I, II, IV, and V) NOT DETECTED NOT DETECTED Final    Comment: Performed at [facility], [address] [place], [place] [address]  C difficile quick scan w PCR reflex     Status: None   Collection Time: 11/30/17 10:29 AM  Result Value Ref Range Status   C Diff antigen NEGATIVE NEGATIVE Final   C Diff toxin NEGATIVE NEGATIVE Final   C Diff interpretation No C. difficile detected.  Final  MRSA PCR Screening     Status: None   Collection Time: 12/01/17  4:57 AM  Result Value Ref Range Status   MRSA by PCR NEGATIVE NEGATIVE Final    Comment:        The GeneXpert MRSA Assay (FDA approved  for NASAL specimens only), is one component of a comprehensive MRSA colonization surveillance program. It is not intended to diagnose MRSA infection nor to guide or monitor treatment for MRSA infections. Performed at [facility], [place] [address], [place], [place] [address]      Labs: BNP (last 3 results) No results for input(s): BNP in the last 8760 hours. Basic Metabolic Panel: Recent Labs  Lab 12/03/17 0611 12/04/17 0636 12/05/17 0428 12/06/17 0349 12/07/17 0249  NA 138 137 135 135 135  K 3.0* 3.2* 3.0* 3.8 4.0  CL 88* 91* 95* 99* 100*  CO2 37* 33* 30 26 25   GLUCOSE 119* 121* 105* 117* 117*  BUN 35* 31* 20 15 15   CREATININE 4.52* 4.29* 3.74* 3.30* 3.02*  CALCIUM 8.6* 8.4* 7.9* 8.0* 8.7*  MG  --   --  0.7* 1.3* 2.0   Liver Function Tests: No results for input(s): AST, ALT, ALKPHOS, BILITOT, PROT, ALBUMIN in the last 168 hours. No results for input(s): LIPASE, AMYLASE in the last 168 hours. No results for input(s): AMMONIA in the last 168 hours. CBC: Recent Labs  Lab 12/03/17 0611 12/04/17 0636 12/05/17 0428 12/06/17 0349 12/07/17 0249  WBC 4.3 5.5 4.9 5.0 4.5  NEUTROABS  --   --  2.3 2.5 2.0  HGB 6.1* 8.2* 7.8* 8.3* 8.4*  HCT 20.1* 26.4* 24.3* 26.1* 26.9*  MCV 85.5 84.9 84.1 85.9 85.9  PLT 79* 71* 73* 82* 81*   Cardiac Enzymes: No results for input(s): CKTOTAL, CKMB, CKMBINDEX, TROPONINI in the last 168 hours. BNP: Invalid input(s): POCBNP CBG: Recent Labs  Lab 12/06/17 0800 12/06/17 1157 12/06/17 1648 12/06/17 2147 12/07/17 0752  GLUCAP 129* 137* 128* 176* 98   D-Dimer No results for input(s): DDIMER in the last 72 hours. Hgb A1c No results for input(s): HGBA1C in the last 72 hours. Lipid Profile No results for input(s): CHOL, HDL, LDLCALC, TRIG, CHOLHDL, LDLDIRECT in the last 72 hours. Thyroid function studies No results for input(s): TSH, T4TOTAL, T3FREE, THYROIDAB in the last 72 hours.  Invalid input(s): FREET3 Anemia work  up Recent Labs    12/06/17 0349  FERRITIN 2,380*  TIBC 185*  IRON 24*   Urinalysis    Component Value Date/Time   COLORURINE YELLOW 12/01/2017 0116   APPEARANCEUR CLEAR 12/01/2017 0116   LABSPEC 1.011 12/01/2017 0116   PHURINE 5.0 12/01/2017 0116   GLUCOSEU >=500 (A) 12/01/2017 0116   HGBUR NEGATIVE 12/01/2017 0116   BILIRUBINUR NEGATIVE 12/01/2017 0116   KETONESUR NEGATIVE 12/01/2017 0116   PROTEINUR NEGATIVE 12/01/2017 0116   UROBILINOGEN 0.2 12/23/2008 1845   NITRITE NEGATIVE 12/01/2017 0116   LEUKOCYTESUR NEGATIVE 12/01/2017 0116   Sepsis Labs Invalid input(s): PROCALCITONIN,  WBC,  LACTICIDVEN Microbiology Recent Results (from the past 240 hour(s))  Gastrointestinal Panel by PCR , Stool     Status: Abnormal   Collection Time: 11/30/17 10:29 AM  Result Value Ref Range Status   Campylobacter species NOT DETECTED NOT DETECTED Final   Plesimonas shigelloides NOT DETECTED NOT DETECTED Final   Salmonella species NOT DETECTED NOT DETECTED Final   Yersinia enterocolitica NOT DETECTED NOT DETECTED Final   Vibrio species NOT DETECTED NOT DETECTED Final   Vibrio cholerae NOT DETECTED NOT DETECTED Final   Enteroaggregative E coli (EAEC) NOT DETECTED NOT DETECTED Final   Enteropathogenic E coli (EPEC) DETECTED (A) NOT DETECTED Final    Comment: RESULT CALLED TO, READ BACK BY AND VERIFIED WITH: TJ SEXTON 11/30/17 1921 KLW    Enterotoxigenic  E coli (ETEC) NOT DETECTED NOT DETECTED Final   Shiga like toxin producing E coli (STEC) NOT DETECTED NOT DETECTED Final   Shigella/Enteroinvasive E coli (EIEC) NOT DETECTED NOT DETECTED Final   Cryptosporidium NOT DETECTED NOT DETECTED Final   Cyclospora cayetanensis NOT DETECTED NOT DETECTED Final   Entamoeba histolytica NOT DETECTED NOT DETECTED Final   Giardia lamblia NOT DETECTED NOT DETECTED Final   Adenovirus F40/41 NOT DETECTED NOT DETECTED Final   Astrovirus NOT DETECTED NOT DETECTED Final   Norovirus GI/GII NOT DETECTED NOT  DETECTED Final   Rotavirus A NOT DETECTED NOT DETECTED Final   Sapovirus (I, II, IV, and V) NOT DETECTED NOT DETECTED Final    Comment: Performed at [facility], [address] [place], [place] [address]  C difficile quick scan w PCR reflex     Status: None   Collection Time: 11/30/17 10:29 AM  Result Value Ref Range Status   C Diff antigen NEGATIVE NEGATIVE Final   C Diff toxin NEGATIVE NEGATIVE Final   C Diff interpretation No C. difficile detected.  Final  MRSA PCR Screening     Status: None   Collection Time: 12/01/17  4:57 AM  Result Value Ref Range Status   MRSA by PCR NEGATIVE NEGATIVE Final    Comment:        The GeneXpert MRSA Assay (FDA approved for NASAL specimens only), is one component of a comprehensive MRSA colonization surveillance program. It is not intended to diagnose MRSA infection nor to guide or monitor treatment for MRSA infections. Performed at [facility], [place] [address] [place], [place] [address]      Time coordinating discharge: Over 38 minutes  SIGNED:   Georgette Shell, MD  [facility] 12/07/2017, 10:51 AM  If 7PM-7AM, please contact night-coverage [email or web address] Password [email or web address]

## 2017-12-07 NOTE — Progress Notes (Signed)
[facility] Progress Note    Assessment/ Plan:   1.  Acute kidney injury with ESRD s/p kidney transplant: Likely prerenal d/t E. coli infection.  Creatinine 3.02 nearing baseline (2-3).  Continues to produce urine.  No signs of active renal transplant rejection.  Currently on Myfortic and restarted home dose Prograf given resolution of diarrhea.  No indications for dialysis at present.  2.  Non-anion gap metabolic acidosis: Resolved following bicarb drip.  Likely from GI losses.  3.  Enteropathogenic E coli:  Likely cause of AKI.  On CTX.  Per primary.  4.  Diabetes: CBGs controlled.  Per primary.  5.  Hypertension:  Normotensive.  Per primary.  6.  Hyponatremia:  Resolved.  Will monitor.  7. Anemia of chronic inflammation:  Hgb stable at 8.4.  Suspect this may be partly dilutional following fluid resuscitation.  Patient without signs of active bleeding.  S/p x2 units pRBC on 4/20.  8. Thrombocytopenia:  Stable at 81.  Suspect this is partly dilutional.  No signs of active bleeding.  Subjective:   Patient states he is doing well today.  No new complaints.  Diarrhea has resolved.  Hopeful to leave today.   Objective:   BP (!) 158/91 (BP Location: Right Arm)   Pulse 97   Temp 99.5 F (37.5 C) (Oral)   Resp 17   Wt 128 lb 4.9 oz (58.2 kg)   SpO2 97%   BMI 21.35 kg/m   Intake/Output Summary (Last 24 hours) at 12/07/2017 0747 Last data filed at 12/06/2017 2057 Gross per 24 hour  Intake 100 ml  Output -  Net 100 ml   Weight change:   Physical Exam: General: emaciated male, NAD with non-toxic appearance HEENT: normocephalic, atraumatic, moist mucous membranes Cardiovascular: regular rate and rhythm without murmurs, rubs, or gallops Lungs: clear to auscultation bilaterally with normal work of breathing Abdomen: soft, non-tender, non-distended, normoactive bowel sounds Skin: warm, dry, no rashes or lesions, cap refill < 2 seconds Extremities: warm and well  perfused, normal tone, no edema  Imaging: No results found.  Labs: BMET Recent Labs  Lab 12/01/17 0928 12/02/17 0819 12/03/17 0611 12/04/17 0636 12/05/17 0428 12/06/17 0349 12/07/17 0249  NA 136 139 138 137 135 135 135  K 3.5 3.1* 3.0* 3.2* 3.0* 3.8 4.0  CL 97* 93* 88* 91* 95* 99* 100*  CO2 25 31 37* 33* 30 26 25   GLUCOSE 187* 161* 119* 121* 105* 117* 117*  BUN 63* 47* 35* 31* 20 15 15   CREATININE 6.25* 5.10* 4.52* 4.29* 3.74* 3.30* 3.02*  CALCIUM 9.3 9.0 8.6* 8.4* 7.9* 8.0* 8.7*   CBC Recent Labs  Lab 12/04/17 0636 12/05/17 0428 12/06/17 0349 12/07/17 0249  WBC 5.5 4.9 5.0 4.5  NEUTROABS  --  2.3 2.5 2.0  HGB 8.2* 7.8* 8.3* 8.4*  HCT 26.4* 24.3* 26.1* 26.9*  MCV 84.9 84.1 85.9 85.9  PLT 71* 73* 82* 81*    Medications:    . aspirin EC  81 mg Oral Daily  . calcitRIOL  0.5 mcg Oral Daily  . dapsone  50 mg Oral Daily  . folic acid  1 mg Oral Daily  . insulin aspart  0-15 Units Subcutaneous TID WC  . insulin aspart  0-5 Units Subcutaneous QHS  . insulin glargine  5 Units Subcutaneous QHS  . mycophenolate  720 mg Oral BID  . tacrolimus  5 mg Oral QHS  . tacrolimus  6 mg Oral Daily     Shanon Brow  Yisroel Ramming, DO, PGY-2

## 2017-12-07 NOTE — Progress Notes (Deleted)
Physician Discharge Summary  [name] DDU:[id number] DOB: May 23, 1971 DOA: 11/29/2017  PCP: Patient, No Pcp Per  Admit date: 11/29/2017 Discharge date: 12/07/2017  Admitted From: home Disposition:  home Recommendations for Outpatient Follow-up:  1. Follow up with PCP in 1-2 weeks 2. Please obtain BMP/CBC in one week:  Home Health yes Equipment/Devices none Discharge Condition stable CODE STATUS:full Diet recommendation:renal Brief/Interim Summary: 47 year old African-American male with history of ESRD status post renal transplant, hypertension, and poorly controlled type 2 diabetes who presented to the emergency department with diarrhea that is now diagnosed secondary to enteropathogenic E. coli after eating at [employer]. As a result, he has become dehydrated and has developed AK I on stage V CKD with nephrology following. He has been started on Rocephin for his E. coli diarrhea with slow improvement noted.    Discharge Diagnoses:  Principal Problem:   Acute renal failure superimposed on stage 5 chronic kidney disease, not on chronic dialysis (HCC) Active Problems:   DM2 (diabetes mellitus, type 2) (HCC)   Essential hypertension, benign   Deceased-donor kidney transplant   Hyponatremia   Diarrhea   Metabolic acidosis, NAG, failure of bicarbonate regeneration  1. AK I on CKD stage V-not on dialysis with prior transplant. This is noted to be secondary to dehydration from his diarrhea.  His renal functions improved with IV hydration.  On the day of discharge his creatinine is 3.02.  Patient was followed by nephrology team. 2. Diarrhea secondary to enteropathogenic E. Coli-his symptoms improved with Rocephin treatment for 6 days. he has not had diarrhea for the last 3 days.  3. Essential hypertension. Stable blood pressure readings currently noted. 4. Hyponatremia/hypomagnesemia resolved.  5. anemia with a drop in hemoglobin from 8-6.1. Partly from dilution.  Status post 2 units  of blood transfusion.  Hemoglobin remained stable.  FOBT negative.     Discharge Instructions  Discharge Instructions    Call MD for:  difficulty breathing, headache or visual disturbances   Complete by:  As directed    Call MD for:  persistant dizziness or light-headedness   Complete by:  As directed    Call MD for:  persistant nausea and vomiting   Complete by:  As directed    Call MD for:  redness, tenderness, or signs of infection (pain, swelling, redness, odor or green/yellow discharge around incision site)   Complete by:  As directed    Call MD for:  severe uncontrolled pain   Complete by:  As directed    Call MD for:  temperature >100.4   Complete by:  As directed    Diet - low sodium heart healthy   Complete by:  As directed    Increase activity slowly   Complete by:  As directed      Allergies as of 12/07/2017      Reactions   Lactose Intolerance (gi) Diarrhea   No whole milk!!   Other Other (See Comments)   Soy milk burns his tongue   Pollen Extract Other (See Comments)   Itching, watery eyes and sneezing.   Tape Rash   PLEASE USE COBAN WRAP OR PAPER TAPE       Medication List    STOP taking these medications   amoxicillin 500 MG tablet Commonly known as:  AMOXIL   bismuth subsalicylate 237 SE/83TD suspension Commonly known as:  PEPTO BISMOL   carvedilol 12.5 MG tablet Commonly known as:  COREG   loperamide 2 MG tablet Commonly known as:  IMODIUM A-D  magnesium oxide 400 MG tablet Commonly known as:  MAG-OX   omeprazole 20 MG capsule Commonly known as:  PRILOSEC   TRESIBA FLEXTOUCH 200 UNIT/ML Sopn Generic drug:  Insulin Degludec     TAKE these medications   ACCU-CHEK NANO SMARTVIEW w/Device Kit Use as instructed   acetaminophen 500 MG tablet Commonly known as:  TYLENOL Take 1,000 mg by mouth every 6 (six) hours as needed for mild pain.   aspirin EC 81 MG tablet Take 81 mg by mouth daily.   calcitRIOL 0.5 MCG capsule Commonly known  as:  ROCALTROL Take 1 capsule by mouth daily.   calcium carbonate 1250 (500 Ca) MG tablet Commonly known as:  OS-CAL - dosed in mg of elemental calcium Take 1 tablet by mouth 3 (three) times daily with meals.   dapsone 25 MG tablet Take 50 mg by mouth daily.   folic acid 1 MG tablet Commonly known as:  FOLVITE Take 1 mg by mouth daily.   freestyle lancets Check blood sugar 4 times a day before meals and bed. diag code E11.9. Insulin dependent   glucose blood test strip Commonly known as:  FREESTYLE LITE Check blood sugar 4 times a day before meals and bed. diag code E11.9. Insulin dependent   HYDROcodone-acetaminophen 5-325 MG tablet Commonly known as:  NORCO/VICODIN Take 1 tablet by mouth every 6 (six) hours as needed for moderate pain.   insulin aspart 100 UNIT/ML FlexPen Commonly known as:  NOVOLOG Inject 12 units with breakfast, 12 units with lunch, 12 units with supper What changed:    how much to take  how to take this  when to take this  additional instructions   insulin glargine 100 UNIT/ML injection Commonly known as:  LANTUS Inject 0.05 mLs (5 Units total) into the skin at bedtime.   ketoconazole 2 % shampoo Commonly known as:  NIZORAL Apply topically 2 (two) times a week. Wash hair and beard.  Leave shampoo on skin for 5 mins before washing off.   mycophenolate 180 MG EC tablet Commonly known as:  MYFORTIC Take 720 mg by mouth See admin instructions. 0800, 2000   ondansetron 4 MG tablet Commonly known as:  ZOFRAN Take 4 mg by mouth daily as needed for indigestion. What changed:  Another medication with the same name was added. Make sure you understand how and when to take each.   ondansetron 4 MG tablet Commonly known as:  ZOFRAN Take 1 tablet (4 mg total) by mouth every 6 (six) hours as needed for nausea. What changed:  You were already taking a medication with the same name, and this prescription was added. Make sure you understand how and when  to take each.   Pen Needles 31G X 8 MM Misc Use 4 times daily to inject insulin into the skin. diag codeE11.9. Insulin dependent   sodium bicarbonate 650 MG tablet Take 1,300 mg by mouth 3 (three) times daily.   tacrolimus 1 MG capsule Commonly known as:  PROGRAF Take 5-6 mg by mouth See admin instructions. 6 mg in the morning and 5 mg in the evening - 1000, 2200       Allergies  Allergen Reactions  . Lactose Intolerance (Gi) Diarrhea    No whole milk!!  . Other Other (See Comments)    Soy milk burns his tongue  . Pollen Extract Other (See Comments)    Itching, watery eyes and sneezing.  . Tape Rash    PLEASE USE COBAN WRAP OR PAPER  TAPE     Consultations:  Nephrology   Procedures/Studies:  No results found. (Echo, Carotid, EGD, Colonoscopy, ERCP)    Subjective: No new complaints eager to go home.  No diarrhea nausea vomiting.   Discharge Exam: Vitals:   12/06/17 2146 12/07/17 0526  BP: 135/81 (!) 158/91  Pulse: 85 97  Resp: 17 17  Temp: 98.4 F (36.9 C) 99.5 F (37.5 C)  SpO2: 98% 97%   Vitals:   12/06/17 0536 12/06/17 1343 12/06/17 2146 12/07/17 0526  BP: (!) 140/127 137/83 135/81 (!) 158/91  Pulse: 98 100 85 97  Resp: 18 20 17 17   Temp: 98.6 F (37 C) 99.4 F (37.4 C) 98.4 F (36.9 C) 99.5 F (37.5 C)  TempSrc: Oral Oral Oral Oral  SpO2: 98% 98% 98% 97%  Weight:   58.2 kg (128 lb 4.9 oz)     General: Pt is alert, awake, not in acute distress Cardiovascular: RRR, S1/S2 +, no rubs, no gallops Respiratory: CTA bilaterally, no wheezing, no rhonchi Abdominal: Soft, NT, ND, bowel sounds + Extremities: no edema, no cyanosis    The results of significant diagnostics from this hospitalization (including imaging, microbiology, ancillary and laboratory) are listed below for reference.     Microbiology: Recent Results (from the past 240 hour(s))  Gastrointestinal Panel by PCR , Stool     Status: Abnormal   Collection Time: 11/30/17 10:29 AM   Result Value Ref Range Status   Campylobacter species NOT DETECTED NOT DETECTED Final   Plesimonas shigelloides NOT DETECTED NOT DETECTED Final   Salmonella species NOT DETECTED NOT DETECTED Final   Yersinia enterocolitica NOT DETECTED NOT DETECTED Final   Vibrio species NOT DETECTED NOT DETECTED Final   Vibrio cholerae NOT DETECTED NOT DETECTED Final   Enteroaggregative E coli (EAEC) NOT DETECTED NOT DETECTED Final   Enteropathogenic E coli (EPEC) DETECTED (A) NOT DETECTED Final    Comment: RESULT CALLED TO, READ BACK BY AND VERIFIED WITH: TJ SEXTON 11/30/17 1921 KLW    Enterotoxigenic E coli (ETEC) NOT DETECTED NOT DETECTED Final   Shiga like toxin producing E coli (STEC) NOT DETECTED NOT DETECTED Final   Shigella/Enteroinvasive E coli (EIEC) NOT DETECTED NOT DETECTED Final   Cryptosporidium NOT DETECTED NOT DETECTED Final   Cyclospora cayetanensis NOT DETECTED NOT DETECTED Final   Entamoeba histolytica NOT DETECTED NOT DETECTED Final   Giardia lamblia NOT DETECTED NOT DETECTED Final   Adenovirus F40/41 NOT DETECTED NOT DETECTED Final   Astrovirus NOT DETECTED NOT DETECTED Final   Norovirus GI/GII NOT DETECTED NOT DETECTED Final   Rotavirus A NOT DETECTED NOT DETECTED Final   Sapovirus (I, II, IV, and V) NOT DETECTED NOT DETECTED Final    Comment: Performed at [facility], [address] [place], [place] [address]  C difficile quick scan w PCR reflex     Status: None   Collection Time: 11/30/17 10:29 AM  Result Value Ref Range Status   C Diff antigen NEGATIVE NEGATIVE Final   C Diff toxin NEGATIVE NEGATIVE Final   C Diff interpretation No C. difficile detected.  Final  MRSA PCR Screening     Status: None   Collection Time: 12/01/17  4:57 AM  Result Value Ref Range Status   MRSA by PCR NEGATIVE NEGATIVE Final    Comment:        The GeneXpert MRSA Assay (FDA approved for NASAL specimens only), is one component of a comprehensive MRSA colonization surveillance  program. It is not intended  to diagnose MRSA infection nor to guide or monitor treatment for MRSA infections. Performed at [facility], [place] [address] [place], [place] [address]      Labs: BNP (last 3 results) No results for input(s): BNP in the last 8760 hours. Basic Metabolic Panel: Recent Labs  Lab 12/03/17 0611 12/04/17 0636 12/05/17 0428 12/06/17 0349 12/07/17 0249  NA 138 137 135 135 135  K 3.0* 3.2* 3.0* 3.8 4.0  CL 88* 91* 95* 99* 100*  CO2 37* 33* 30 26 25   GLUCOSE 119* 121* 105* 117* 117*  BUN 35* 31* 20 15 15   CREATININE 4.52* 4.29* 3.74* 3.30* 3.02*  CALCIUM 8.6* 8.4* 7.9* 8.0* 8.7*  MG  --   --  0.7* 1.3* 2.0   Liver Function Tests: No results for input(s): AST, ALT, ALKPHOS, BILITOT, PROT, ALBUMIN in the last 168 hours. No results for input(s): LIPASE, AMYLASE in the last 168 hours. No results for input(s): AMMONIA in the last 168 hours. CBC: Recent Labs  Lab 12/03/17 0611 12/04/17 0636 12/05/17 0428 12/06/17 0349 12/07/17 0249  WBC 4.3 5.5 4.9 5.0 4.5  NEUTROABS  --   --  2.3 2.5 2.0  HGB 6.1* 8.2* 7.8* 8.3* 8.4*  HCT 20.1* 26.4* 24.3* 26.1* 26.9*  MCV 85.5 84.9 84.1 85.9 85.9  PLT 79* 71* 73* 82* 81*   Cardiac Enzymes: No results for input(s): CKTOTAL, CKMB, CKMBINDEX, TROPONINI in the last 168 hours. BNP: Invalid input(s): POCBNP CBG: Recent Labs  Lab 12/06/17 0800 12/06/17 1157 12/06/17 1648 12/06/17 2147 12/07/17 0752  GLUCAP 129* 137* 128* 176* 98   D-Dimer No results for input(s): DDIMER in the last 72 hours. Hgb A1c No results for input(s): HGBA1C in the last 72 hours. Lipid Profile No results for input(s): CHOL, HDL, LDLCALC, TRIG, CHOLHDL, LDLDIRECT in the last 72 hours. Thyroid function studies No results for input(s): TSH, T4TOTAL, T3FREE, THYROIDAB in the last 72 hours.  Invalid input(s): FREET3 Anemia work up Recent Labs    12/06/17 0349  FERRITIN 2,380*  TIBC 185*  IRON 24*   Urinalysis     Component Value Date/Time   COLORURINE YELLOW 12/01/2017 0116   APPEARANCEUR CLEAR 12/01/2017 0116   LABSPEC 1.011 12/01/2017 0116   PHURINE 5.0 12/01/2017 0116   GLUCOSEU >=500 (A) 12/01/2017 0116   HGBUR NEGATIVE 12/01/2017 0116   BILIRUBINUR NEGATIVE 12/01/2017 0116   KETONESUR NEGATIVE 12/01/2017 0116   PROTEINUR NEGATIVE 12/01/2017 0116   UROBILINOGEN 0.2 12/23/2008 1845   NITRITE NEGATIVE 12/01/2017 0116   LEUKOCYTESUR NEGATIVE 12/01/2017 0116   Sepsis Labs Invalid input(s): PROCALCITONIN,  WBC,  LACTICIDVEN Microbiology Recent Results (from the past 240 hour(s))  Gastrointestinal Panel by PCR , Stool     Status: Abnormal   Collection Time: 11/30/17 10:29 AM  Result Value Ref Range Status   Campylobacter species NOT DETECTED NOT DETECTED Final   Plesimonas shigelloides NOT DETECTED NOT DETECTED Final   Salmonella species NOT DETECTED NOT DETECTED Final   Yersinia enterocolitica NOT DETECTED NOT DETECTED Final   Vibrio species NOT DETECTED NOT DETECTED Final   Vibrio cholerae NOT DETECTED NOT DETECTED Final   Enteroaggregative E coli (EAEC) NOT DETECTED NOT DETECTED Final   Enteropathogenic E coli (EPEC) DETECTED (A) NOT DETECTED Final    Comment: RESULT CALLED TO, READ BACK BY AND VERIFIED WITH: TJ SEXTON 11/30/17 1921 KLW    Enterotoxigenic E coli (ETEC) NOT DETECTED NOT DETECTED Final   Shiga like toxin producing E coli (STEC) NOT  DETECTED NOT DETECTED Final   Shigella/Enteroinvasive E coli (EIEC) NOT DETECTED NOT DETECTED Final   Cryptosporidium NOT DETECTED NOT DETECTED Final   Cyclospora cayetanensis NOT DETECTED NOT DETECTED Final   Entamoeba histolytica NOT DETECTED NOT DETECTED Final   Giardia lamblia NOT DETECTED NOT DETECTED Final   Adenovirus F40/41 NOT DETECTED NOT DETECTED Final   Astrovirus NOT DETECTED NOT DETECTED Final   Norovirus GI/GII NOT DETECTED NOT DETECTED Final   Rotavirus A NOT DETECTED NOT DETECTED Final   Sapovirus (I, II, IV, and V) NOT  DETECTED NOT DETECTED Final    Comment: Performed at [facility], [address] [place], [place] [address]  C difficile quick scan w PCR reflex     Status: None   Collection Time: 11/30/17 10:29 AM  Result Value Ref Range Status   C Diff antigen NEGATIVE NEGATIVE Final   C Diff toxin NEGATIVE NEGATIVE Final   C Diff interpretation No C. difficile detected.  Final  MRSA PCR Screening     Status: None   Collection Time: 12/01/17  4:57 AM  Result Value Ref Range Status   MRSA by PCR NEGATIVE NEGATIVE Final    Comment:        The GeneXpert MRSA Assay (FDA approved for NASAL specimens only), is one component of a comprehensive MRSA colonization surveillance program. It is not intended to diagnose MRSA infection nor to guide or monitor treatment for MRSA infections. Performed at [facility], [place] [address], [place], [place] [address]      Time coordinating discharge:35 min  SIGNED:   Georgette Shell, MD  [facility] 12/07/2017, 9:55 AM Pager   If 7PM-7AM, please contact night-coverage [email or web address] Password [email or web address]

## 2017-12-12 ENCOUNTER — Ambulatory Visit: Payer: Self-pay | Admitting: Nurse Practitioner

## 2017-12-20 ENCOUNTER — Encounter: Payer: Self-pay | Admitting: Nurse Practitioner

## 2017-12-20 ENCOUNTER — Ambulatory Visit (INDEPENDENT_AMBULATORY_CARE_PROVIDER_SITE_OTHER): Payer: Medicare Other | Admitting: Nurse Practitioner

## 2017-12-20 VITALS — BP 128/76 | HR 90 | Temp 98.5°F | Ht 65.0 in | Wt 126.0 lb

## 2017-12-20 DIAGNOSIS — K219 Gastro-esophageal reflux disease without esophagitis: Secondary | ICD-10-CM

## 2017-12-20 DIAGNOSIS — N186 End stage renal disease: Secondary | ICD-10-CM | POA: Diagnosis not present

## 2017-12-20 DIAGNOSIS — E785 Hyperlipidemia, unspecified: Secondary | ICD-10-CM | POA: Diagnosis not present

## 2017-12-20 DIAGNOSIS — I10 Essential (primary) hypertension: Secondary | ICD-10-CM

## 2017-12-20 DIAGNOSIS — A09 Infectious gastroenteritis and colitis, unspecified: Secondary | ICD-10-CM

## 2017-12-20 DIAGNOSIS — Z794 Long term (current) use of insulin: Secondary | ICD-10-CM | POA: Diagnosis not present

## 2017-12-20 DIAGNOSIS — I420 Dilated cardiomyopathy: Secondary | ICD-10-CM | POA: Diagnosis not present

## 2017-12-20 DIAGNOSIS — N185 Chronic kidney disease, stage 5: Secondary | ICD-10-CM

## 2017-12-20 DIAGNOSIS — E1122 Type 2 diabetes mellitus with diabetic chronic kidney disease: Secondary | ICD-10-CM

## 2017-12-20 MED ORDER — CARVEDILOL 6.25 MG PO TABS: 6.2500 mg | ORAL_TABLET | Freq: Two times a day (BID) | ORAL | 3 refills | Status: DC

## 2017-12-20 MED ORDER — OMEPRAZOLE 20 MG PO CPDR: 20.0000 mg | DELAYED_RELEASE_CAPSULE | Freq: Every day | ORAL | 3 refills | Status: DC

## 2017-12-20 NOTE — Progress Notes (Signed)
Careteam: Patient Care Team: [name], NP as PCP - General (Geriatric Medicine) Fleet Contras, MD as Consulting Physician (Nephrology) [name], MD as Referring Physician (Ophthalmology)  Advanced Directive information Does Patient Have a Medical Advance Directive?: No, Would patient like information on creating a medical advance directive?: Yes (MAU/Ambulatory/Procedural Areas - Information given)  Allergies  Allergen Reactions  . Lactose Intolerance (Gi) Diarrhea    No whole milk!!  . Other Other (See Comments)    Soy milk burns his tongue  . Pollen Extract Other (See Comments)    Itching, watery eyes and sneezing.  . Tape Rash    PLEASE USE COBAN WRAP OR PAPER TAPE     Chief Complaint  Patient presents with  . Medical Management of Chronic Issues    Pt is being seen to establish care.     HPI: Patient is a 47 y.o. male seen in the office today to establish care and routine follow up. Previous pt of Dr Brigitte Pulse. Last physical was in October 2017.  Pt recently hospitalized due to dehydration due to e.coli diarrhea. Diarrhea lasted for over 12 days.    Recent lumbar spine surgery- will have left leg pain- taking tylenol 1000 mg as needed, does not need daily.   CKD stage 5, s/p kidney transplant in 2015- taking calcitriol, calcium, sodium bicarb, folic acid  Original kidney transplant was 2013 however donor had clotting issue and kidney failed. Had to be removed and had another transplant.  Taking dapsone, prograf and myfortic for anti-rejection through nephrologist who is managing.   Diabetes- taking novolog SSI- has a smart meter that tells him how much to take, tells the meter how many carbs he has taken and then he takes novolog which is recommended, he has gone a while without supplies since he was dismissed from previous practice (dismissed due to no show appts) Low in the morning but high before meals.  Hospital gave him prescription for test  strips and needles to check blood sugar.  Currently on lantus 5 units- previously on 52 units.  Fasting- 229, 102, 166, 75, 175, 95, 178, 172, 110,158, 109, 203 Before lunch- 162  CHF/HTN- currently taking carvedilol 6.25 mg by mouth twice daily and ASA  GERD- taking omeprazole 20 mg daily which helps prevent GERD  Review of Systems:  Review of Systems  Constitutional: Negative for chills, fever and weight loss.  HENT: Negative for tinnitus.   Respiratory: Negative for cough, sputum production and shortness of breath.   Cardiovascular: Negative for chest pain, palpitations and leg swelling.  Gastrointestinal: Negative for abdominal pain, constipation, diarrhea and heartburn.  Genitourinary: Negative for dysuria, frequency and urgency.  Musculoskeletal: Negative for back pain, falls, joint pain and myalgias.  Skin: Negative.   Neurological: Positive for tremors (worse in the morning). Negative for dizziness and headaches.  Psychiatric/Behavioral: Negative for depression and memory loss. The patient does not have insomnia.     Past Medical History:  Diagnosis Date  . Anemia   . CHF (congestive heart failure) ([place])   . Diabetes mellitus 12/2008   A1C 8.3 12/23/08, 24hr urine protein 2793 mg/day, SPEP neg, proliferative BDR s/p photocoagulation 08/2009 done at [facility] and f/u by DR Groat  . ESRD (end stage renal disease) on dialysis ([place])    1.Initiated HD via right per cate 12/30/2008 (MWF schedule at [facility]) 2.Left upper extremity A-V fistula by Dr Oneida Alar 12/30/2008 3.Aemia: Received Venofer 12/30/08 Ferritin 179 % sat 12  12/23/08, 4. Secondary Hyperparathyroidism- PTH 188, P 5.4, Ca 9.2 12/2008  . Hemodialysis-associated hypotension    coreg d/c'd  . History of blood transfusion   . Hypertension    resloved after initating HD, now with post HD hypotension  . Kidney transplant as cause of abnormal reaction or later complication [id number]   [facility]  . Non-ischemic  cardiomyopathy ([place])    EF 20-25% 12/23/2008>>EF 40-45% 03/2009>>EF ??? 7/?/2011  . Psoriasis    scalp   Past Surgical History:  Procedure Laterality Date  . AV FISTULA PLACEMENT     left  . AV FISTULA PLACEMENT  09/12/2012   Procedure: INSERTION OF ARTERIOVENOUS (AV) GORE-TEX GRAFT ARM;  Surgeon: Elam Dutch, MD;  Location: [place];  Service: Vascular;  Laterality: Left;  KIDNEY TRANSPLANT FAILED  . CATARACT EXTRACTION, BILATERAL    . EYE SURGERY    . INSERTION OF DIALYSIS CATHETER  07/14/2012   Procedure: INSERTION OF DIALYSIS CATHETER;  Surgeon: Mal Misty, MD;  Location: [place];  Service: Vascular;  Laterality: Right;  Ultrasound Guided Insertion of Internal Jugular Dialysis Catheter  . KIDNEY TRANSPLANT  2013   did not work  . lumbar disc sugery   10/17/2017  . NEPHRECTOMY TRANSPLANTED ORGAN  08/11/12  . PHOTOCOAGULATION     for diabetic retinopathy 08/1999  . SHUNTOGRAM N/A 07/11/2012   Procedure: Earney Mallet;  Surgeon: Serafina Mitchell, MD;  Location: [facility] CATH LAB;  Service: Cardiovascular;  Laterality: N/A;   Social History:   reports that he has never smoked. He has never used smokeless tobacco. He reports that he does not drink alcohol or use drugs.  Family History  Problem Relation Age of Onset  . Diabetes Father   . Kidney disease Father   . Other Father        amputation  . Hypertension Mother   . Diabetes Sister   . Hypertension Sister   . Diabetes Sister   . Kidney disease Sister   . Diabetes Sister   . Hypertension Brother   . Diabetes Brother   . CAD Neg Hx   . Sudden Cardiac Death Neg Hx   . Congestive Heart Failure Neg Hx   . Colon cancer Neg Hx   . Rectal cancer Neg Hx   . Esophageal cancer Neg Hx   . Liver cancer Neg Hx     Medications: Patient's Medications  New Prescriptions   No medications on file  Previous Medications   ACETAMINOPHEN (TYLENOL) 500 MG TABLET    Take 1,000 mg by mouth every 6 (six) hours as needed for mild pain.   ASPIRIN  EC 81 MG TABLET    Take 81 mg by mouth daily.   CALCITRIOL (ROCALTROL) 0.5 MCG CAPSULE    Take 1 capsule by mouth daily.    CALCIUM CARBONATE (OS-CAL - DOSED IN MG OF ELEMENTAL CALCIUM) 1250 MG TABLET    Take 1 tablet by mouth 3 (three) times daily with meals.   DAPSONE 25 MG TABLET    Take 50 mg by mouth daily.   FOLIC ACID (FOLVITE) 1 MG TABLET    Take 1 mg by mouth daily.    GLUCOSE BLOOD (ACCU-CHEK AVIVA) TEST STRIP    Check blood sugar 4 times daily and before bed time.   INSULIN ASPART (NOVOLOG) 100 UNIT/ML FLEXPEN    Inject 12 Units into the skin 3 (three) times daily before meals.   INSULIN GLARGINE (LANTUS) 100 UNIT/ML INJECTION    Inject  0.05 mLs (5 Units total) into the skin at bedtime.   INSULIN PEN NEEDLE 31G X 5 MM MISC    5 Units/oz/day by Does not apply route at bedtime. Use 4 times daily to inject insulin to skin   KETOCONAZOLE (NIZORAL) 2 % SHAMPOO    Apply topically 2 (two) times a week. Wash hair and beard.  Leave shampoo on skin for 5 mins before washing off.   LANCETS (ACCU-CHEK MULTICLIX) LANCETS    Use as directed   MYCOPHENOLATE (MYFORTIC) 180 MG EC TABLET    Take 720 mg by mouth See admin instructions. 0800, 2000   ONDANSETRON (ZOFRAN) 4 MG TABLET    Take 4 mg by mouth daily as needed for indigestion.   SODIUM BICARBONATE 650 MG TABLET    Take 1,300 mg by mouth 3 (three) times daily.    TACROLIMUS (PROGRAF) 1 MG CAPSULE    Take 5-6 mg by mouth See admin instructions. 6 mg in the morning and 5 mg in the evening - 1000, 2200  Modified Medications   No medications on file  Discontinued Medications   HYDROCODONE-ACETAMINOPHEN (NORCO/VICODIN) 5-325 MG TABLET    Take 1 tablet by mouth every 6 (six) hours as needed for moderate pain.   INSULIN GLARGINE (LANTUS) 100 UNIT/ML SOLOSTAR PEN    Inject 5 Units into the skin daily at 10 pm.   ONDANSETRON (ZOFRAN) 4 MG TABLET    Take 1 tablet (4 mg total) by mouth every 6 (six) hours as needed for nausea.     Physical  Exam:  Vitals:   12/20/17 0851  BP: 128/76  Pulse: 90  Temp: 98.5 F (36.9 C)  TempSrc: Oral  SpO2: 96%  Weight: 126 lb (57.2 kg)  Height: 5\' 5"  (1.651 m)   Body mass index is 20.97 kg/m.  Physical Exam  Constitutional: He is oriented to person, place, and time. He appears well-developed and well-nourished. No distress.  HENT:  Head: Normocephalic and atraumatic.  Mouth/Throat: Oropharynx is clear and moist. No oropharyngeal exudate.  Eyes: Pupils are equal, round, and reactive to light. Conjunctivae and EOM are normal.  Neck: Normal range of motion. Neck supple.  Cardiovascular: Normal rate, regular rhythm and normal heart sounds.  Pulmonary/Chest: Effort normal and breath sounds normal.  Abdominal: Soft. Bowel sounds are normal.  Musculoskeletal: He exhibits no edema or tenderness.  Neurological: He is alert and oriented to person, place, and time.  Skin: Skin is warm and dry. He is not diaphoretic.  Psychiatric: He has a normal mood and affect.   Labs reviewed: Basic Metabolic Panel: Recent Labs    12/05/17 0428 12/06/17 0349 12/07/17 0249  NA 135 135 135  K 3.0* 3.8 4.0  CL 95* 99* 100*  CO2 30 26 25   GLUCOSE 105* 117* 117*  BUN 20 15 15   CREATININE 3.74* 3.30* 3.02*  CALCIUM 7.9* 8.0* 8.7*  MG 0.7* 1.3* 2.0   Liver Function Tests: Recent Labs    02/08/17 0803 02/09/17 0616 11/29/17 1855  AST 16 18 12*  ALT 12* 15* 12*  ALKPHOS 112 112 94  BILITOT 0.4 0.4 0.6  PROT 5.0* 5.5* 6.9  ALBUMIN 2.9* 3.0* 4.3   Recent Labs    11/29/17 1855  LIPASE 39   No results for input(s): AMMONIA in the last 8760 hours. CBC: Recent Labs    12/05/17 0428 12/06/17 0349 12/07/17 0249  WBC 4.9 5.0 4.5  NEUTROABS 2.3 2.5 2.0  HGB 7.8* 8.3* 8.4*  HCT  24.3* 26.1* 26.9*  MCV 84.1 85.9 85.9  PLT 73* 82* 81*   Lipid Panel: No results for input(s): CHOL, HDL, LDLCALC, TRIG, CHOLHDL, LDLDIRECT in the last 8760 hours. TSH: No results for input(s): TSH in the last  8760 hours. A1C: Lab Results  Component Value Date   HGBA1C 15.0 (H) 02/08/2017     Assessment/Plan 1. Essential hypertension, benign Stable on current regimen - CBC with Differential/Platelets  2. Congestive dilated cardiomyopathy (HCC) -euvolemic, continues on coreg BID  - carvedilol (COREG) 6.25 MG tablet; Take 1 tablet (6.25 mg total) by mouth 2 (two) times daily with a meal.  Dispense: 60 tablet; Refill: 3  3. Type 2 diabetes mellitus with stage 5 chronic kidney disease not on chronic dialysis, with long-term current use of insulin (HCC) -recently has decreased lantus from 52 units to 5 units daily. Continues on Sliding scale novolog. Fasting blood sugar varying, to continue current regimen and notify if blood sugars remaining elevated.  - Hemoglobin A1c  4. End stage renal disease ([place]) -s/p transplant. -continues with close follow up with nephrology and seeing transplant surgeon yearly. Will cont current regimen    5. Hyperlipidemia with target low density lipoprotein (LDL) cholesterol less than 100 mg/dL - Lipid Panel - COMPLETE METABOLIC PANEL WITH GFR  6. Gastroesophageal reflux disease without esophagitis Controlled with omeprazole and dietary modifications.  - omeprazole (PRILOSEC) 20 MG capsule; Take 1 capsule (20 mg total) by mouth daily.  Dispense: 30 capsule; Refill: 3  7. Diarrhea Recently hospitalized due to e. Coli diarrhea. Current resolved.  Follow up labs today  Next appt: 6 weeks for AWV and Physical  Joenathan Sakuma K. [place], [facility] [phone]

## 2017-12-20 NOTE — Patient Instructions (Signed)
To follow up in 6 weeks for AWV with physical afterwards  Fasting labs done today.

## 2017-12-21 LAB — COMPLETE METABOLIC PANEL WITH GFR
AG Ratio: 1.8 (calc) (ref 1.0–2.5)
ALT: 12 U/L (ref 9–46)
AST: 13 U/L (ref 10–40)
Albumin: 4.2 g/dL (ref 3.6–5.1)
Alkaline phosphatase (APISO): 66 U/L (ref 40–115)
BUN/Creatinine Ratio: 8 (calc) (ref 6–22)
BUN: 29 mg/dL — AB (ref 7–25)
CALCIUM: 9 mg/dL (ref 8.6–10.3)
CO2: 26 mmol/L (ref 20–32)
Chloride: 102 mmol/L (ref 98–110)
Creat: 3.59 mg/dL — ABNORMAL HIGH (ref 0.60–1.35)
GFR, EST AFRICAN AMERICAN: 22 mL/min/{1.73_m2} — AB (ref >=60)
GFR, EST NON AFRICAN AMERICAN: 19 mL/min/{1.73_m2} — AB (ref >=60)
GLUCOSE: 212 mg/dL — AB (ref 65–99)
Globulin: 2.4 g/dL (calc) (ref 1.9–3.7)
Potassium: 4.4 mmol/L (ref 3.5–5.3)
Sodium: 137 mmol/L (ref 135–146)
TOTAL PROTEIN: 6.6 g/dL (ref 6.1–8.1)
Total Bilirubin: 0.5 mg/dL (ref 0.2–1.2)

## 2017-12-21 LAB — CBC WITH DIFFERENTIAL/PLATELET
BASOS PCT: 1.8 %
Basophils Absolute: 79 cells/uL (ref 0–200)
EOS PCT: 4.8 %
Eosinophils Absolute: 211 cells/uL (ref 15–500)
HEMATOCRIT: 28.2 % — AB (ref 38.5–50.0)
HEMOGLOBIN: 9 g/dL — AB (ref 13.2–17.1)
LYMPHS ABS: 1232 {cells}/uL (ref 850–3900)
MCH: 27.6 pg (ref 27.0–33.0)
MCHC: 31.9 g/dL — ABNORMAL LOW (ref 32.0–36.0)
MCV: 86.5 fL (ref 80.0–100.0)
MONOS PCT: 15.7 %
MPV: 12.9 fL — AB (ref 7.5–12.5)
NEUTROS ABS: 2187 {cells}/uL (ref 1500–7800)
Neutrophils Relative %: 49.7 %
Platelets: 106 10*3/uL — ABNORMAL LOW (ref 140–400)
RBC: 3.26 10*6/uL — AB (ref 4.20–5.80)
RDW: 15 % (ref 11.0–15.0)
Total Lymphocyte: 28 %
WBC mixed population: 691 cells/uL (ref 200–950)
WBC: 4.4 10*3/uL (ref 3.8–10.8)

## 2017-12-21 LAB — LIPID PANEL
Cholesterol: 188 mg/dL (ref <200)
HDL: 32 mg/dL — ABNORMAL LOW (ref >40)
LDL Cholesterol (Calc): 118 mg/dL (calc) — ABNORMAL HIGH
Non-HDL Cholesterol (Calc): 156 mg/dL (calc) — ABNORMAL HIGH (ref <130)
Total CHOL/HDL Ratio: 5.9 (calc) — ABNORMAL HIGH (ref <5.0)
Triglycerides: 269 mg/dL — ABNORMAL HIGH (ref <150)

## 2017-12-21 LAB — HEMOGLOBIN A1C
HEMOGLOBIN A1C: 6.2 %{Hb} — AB (ref <5.7)
MEAN PLASMA GLUCOSE: 131 (calc)
eAG (mmol/L): 7.3 (calc)

## 2017-12-26 ENCOUNTER — Ambulatory Visit: Payer: Medicare Other | Admitting: Pharmacotherapy

## 2017-12-27 ENCOUNTER — Telehealth: Payer: Self-pay | Admitting: Nurse Practitioner

## 2017-12-27 ENCOUNTER — Telehealth: Payer: Self-pay | Admitting: *Deleted

## 2017-12-27 MED ORDER — ATORVASTATIN CALCIUM 10 MG PO TABS: ORAL_TABLET | ORAL | 1 refills | Status: DC

## 2017-12-27 NOTE — Telephone Encounter (Signed)
Wife called and stated that they spoke with Dr. Jason Nest (Nephrologist) office regarding the Cholesterol medication. Stated that he told them that it would be ok for him to take the Lipitor but wanted [place] to check Liver enzymes 1 month after starting and if he had any leg cramps to notify PCP. Wants Rx sent to [facility].   Lab appointment scheduled with wife for 01/27/18. Pended Rx for [employer].   Please Place order for bloodwork.

## 2017-12-27 NOTE — Telephone Encounter (Signed)
LMOM to return call.

## 2017-12-27 NOTE — Telephone Encounter (Signed)
Pt does not need lab appt, has scheduled physical and will recheck labs at that date. Please make sure he has been set up for an AWV though. Thank you

## 2017-12-27 NOTE — Telephone Encounter (Signed)
°  Left msg asking pt to confirm this AWV-S appt w/ nurse. Last AWV 09/17/16. VDM (DD)

## 2017-12-27 NOTE — Telephone Encounter (Signed)
DeeDee will you try to schedule patient for a AWV before his CPE with Janett Billow.

## 2017-12-28 ENCOUNTER — Encounter: Payer: Self-pay | Admitting: Nurse Practitioner

## 2017-12-29 NOTE — Telephone Encounter (Signed)
Spoke with DeeDee and she has contacted patient. LM with an appointment time for a AWV.

## 2018-01-03 LAB — CMP 10231
Albumin: 3.7
CALCIUM: 9.1
CO2: 25
Chloride: 102
GFR CALC NON AF AMER: 28.4
Total Protein: 6.3

## 2018-01-05 DIAGNOSIS — Z029 Encounter for administrative examinations, unspecified: Secondary | ICD-10-CM

## 2018-01-11 ENCOUNTER — Inpatient Hospital Stay (HOSPITAL_COMMUNITY)
Admission: RE | Admit: 2018-01-11 | Discharge: 2018-01-11 | Disposition: A | Discharge status: Home / Self Care | Payer: Medicare Other | Source: Ambulatory Visit | Attending: Nephrology | Admitting: Nephrology

## 2018-01-16 ENCOUNTER — Ambulatory Visit (INDEPENDENT_AMBULATORY_CARE_PROVIDER_SITE_OTHER): Payer: Medicare Other | Admitting: Nurse Practitioner

## 2018-01-16 ENCOUNTER — Encounter: Payer: Self-pay | Admitting: Nurse Practitioner

## 2018-01-16 VITALS — BP 124/72 | HR 73 | Temp 98.0°F | Ht 65.0 in | Wt 117.8 lb

## 2018-01-16 DIAGNOSIS — R197 Diarrhea, unspecified: Secondary | ICD-10-CM | POA: Diagnosis not present

## 2018-01-16 DIAGNOSIS — R0602 Shortness of breath: Secondary | ICD-10-CM | POA: Diagnosis not present

## 2018-01-16 NOTE — Progress Notes (Signed)
Careteam: Patient Care Team: [name], NP as PCP - General (Geriatric Medicine) [name], MD as Referring Physician (Ophthalmology) [name], MD as Consulting Physician (Cardiology) [name], MD as Consulting Physician (Nephrology) [name], MD as Referring Physician (Surgery)  Advanced Directive information    Allergies  Allergen Reactions  . Lactose Intolerance (Gi) Diarrhea    No whole milk!!  . Other Other (See Comments)    Soy milk burns his tongue  . Pollen Extract Other (See Comments)    Itching, watery eyes and sneezing.  . Tape Rash    PLEASE USE COBAN WRAP OR PAPER TAPE     Chief Complaint  Patient presents with  . Acute Visit    Pt is being seen due to SOB when walking short distances for about a week. Pt also reports feeling weak, tired, and has severel chills.      HPI: Patient is a 47 y.o. male seen in the office today due to shortness of breath when walking.  When he gets up he has had shortness of breath with is a new problem. Never had this before.  Overall feeling increase fatigued and tired.  No cough or congestion.  No pain with breathing.  No chest pains.  No dizziness or headache.  Walks minimally and is exhausted.  Wrapping up with covers because he is having chills and cold   Review of Systems:  Review of Systems  Constitutional: Positive for chills, malaise/fatigue and weight loss. Negative for fever.  Respiratory: Positive for shortness of breath and wheezing. Negative for cough and sputum production.   Cardiovascular: Negative for chest pain, palpitations, orthopnea and leg swelling.  Gastrointestinal: Positive for diarrhea (having about 3 loose stools a day). Negative for abdominal pain, constipation, heartburn, nausea and vomiting.       Early satiety   Genitourinary: Negative for dysuria, frequency, hematuria and urgency.  Neurological: Positive for weakness. Negative for dizziness and headaches.       Unsteady  Endo/Heme/Allergies: Negative for environmental allergies.    Past Medical History:  Diagnosis Date  . Anemia   . Cardiomyopathy ([place])    echo from 12/16 showed nl EF of 55 - 60%  . CHF (congestive heart failure) ([place])   . CKD (chronic kidney disease), stage III ([place])   . Diabetes mellitus 12/2008   A1C 8.3 12/23/08, 24hr urine protein 2793 mg/day, SPEP neg, proliferative BDR s/p photocoagulation 08/2009 done at [facility] and f/u by DR Groat  . ESRD (end stage renal disease) on dialysis ([place])    1.Initiated HD via right per cate 12/30/2008 (MWF schedule at [facility]) 2.Left upper extremity A-V fistula by Dr Oneida Alar 12/30/2008 3.Aemia: Received Venofer 12/30/08 Ferritin 179 % sat 12 12/23/08, 4. Secondary Hyperparathyroidism- PTH 188, P 5.4, Ca 9.2 12/2008  . Hemodialysis-associated hypotension    coreg d/c'd  . Herniated lumbar disc without myelopathy   . History of blood transfusion   . Hyperparathyroidism due to renal insufficiency ([place])   . Hypertension    resloved after initating HD, now with post HD hypotension  . Hypocalcemia   . Hypomagnesemia   . Kidney transplant as cause of abnormal reaction or later complication 7902   [facility]  . Non-ischemic cardiomyopathy ([place])    EF 20-25% 12/23/2008>>EF 40-45% 03/2009>>EF ??? 7/?/2011  . Occult blood positive stool   . Psoriasis    scalp  . Thrombocytopenia ([place])   . Vitamin B 12 deficiency  Past Surgical History:  Procedure Laterality Date  . AV FISTULA PLACEMENT     left  . AV FISTULA PLACEMENT  09/12/2012   Procedure: INSERTION OF ARTERIOVENOUS (AV) GORE-TEX GRAFT ARM;  Surgeon: Elam Dutch, MD;  Location: [place];  Service: Vascular;  Laterality: Left;  KIDNEY TRANSPLANT FAILED  . CATARACT EXTRACTION, BILATERAL    . EYE SURGERY    . INSERTION OF DIALYSIS CATHETER  07/14/2012   Procedure: INSERTION OF DIALYSIS CATHETER;  Surgeon: Mal Misty, MD;  Location: [place];  Service: Vascular;  Laterality:  Right;  Ultrasound Guided Insertion of Internal Jugular Dialysis Catheter  . 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 KIDNEY TRANSPLANT  12/2013   second kidney transplant  . lumbar disc sugery   10/17/2017  . LUMBAR DISC SURGERY    . NEPHRECTOMY TRANSPLANTED ORGAN  08/11/12  . PHOTOCOAGULATION     for diabetic retinopathy 08/1999  . SHUNTOGRAM N/A 07/11/2012   Procedure: Earney Mallet;  Surgeon: Serafina Mitchell, MD;  Location: [facility] CATH LAB;  Service: Cardiovascular;  Laterality: N/A;   Social History:   reports that he has never smoked. He has never used smokeless tobacco. He reports that he does not drink alcohol or use drugs.  Family History  Problem Relation Age of Onset  . Diabetes Father   . Kidney disease Father   . Other Father        amputation  . Hypertension Mother   . Dementia Mother   . Diabetes Sister   . Hypertension Sister   . Diabetes Sister   . Kidney disease Sister   . Diabetes Sister   . Hypertension Brother   . Diabetes Brother   . CAD Neg Hx   . Sudden Cardiac Death Neg Hx   . Congestive Heart Failure Neg Hx   . Colon cancer Neg Hx   . Rectal cancer Neg Hx   . Esophageal cancer Neg Hx   . Liver cancer Neg Hx     Medications: Patient's Medications  New Prescriptions   No medications on file  Previous Medications   ACETAMINOPHEN (TYLENOL) 500 MG TABLET    Take 1,000 mg by mouth every 6 (six) hours as needed for mild pain.   ASPIRIN EC 81 MG TABLET    Take 81 mg by mouth daily.   ATORVASTATIN (LIPITOR) 10 MG TABLET    Take one tablet by mouth at bedtime for cholesterol.   CALCITRIOL (ROCALTROL) 0.5 MCG CAPSULE    Take 1 capsule by mouth daily.    CALCIUM CARBONATE (OS-CAL - DOSED IN MG OF ELEMENTAL CALCIUM) 1250 MG TABLET    Take 1 tablet by mouth 3 (three) times daily with meals.   CARVEDILOL (COREG) 6.25 MG TABLET    Take 1 tablet (6.25 mg total) by mouth 2 (two) times daily with a meal.   DAPSONE 25 MG TABLET    Take 50 mg  by mouth daily.   FOLIC ACID (FOLVITE) 1 MG TABLET    Take 1 mg by mouth daily.    GLUCOSE BLOOD (ACCU-CHEK AVIVA) TEST STRIP    Check blood sugar 4 times daily and before bed time.   INSULIN ASPART (NOVOLOG) 100 UNIT/ML FLEXPEN    Inject 12 Units into the skin 3 (three) times daily before meals.   INSULIN GLARGINE (LANTUS) 100 UNIT/ML INJECTION    Inject 0.05 mLs (5 Units total) into the skin at bedtime.   INSULIN  PEN NEEDLE 31G X 5 MM MISC    5 Units/oz/day by Does not apply route at bedtime. Use 4 times daily to inject insulin to skin   KETOCONAZOLE (NIZORAL) 2 % SHAMPOO    Apply topically 2 (two) times a week. Wash hair and beard.  Leave shampoo on skin for 5 mins before washing off.   LANCETS (ACCU-CHEK MULTICLIX) LANCETS    Use as directed   MYCOPHENOLATE (MYFORTIC) 180 MG EC TABLET    Take 720 mg by mouth See admin instructions. 0800, 2000   OMEPRAZOLE (PRILOSEC) 20 MG CAPSULE    Take 1 capsule (20 mg total) by mouth daily.   SODIUM BICARBONATE 650 MG TABLET    Take 1,300 mg by mouth 3 (three) times daily.    TACROLIMUS (PROGRAF) 1 MG CAPSULE    Take 5-6 mg by mouth See admin instructions. 6 mg in the morning and 5 mg in the evening - 1000, 2200  Modified Medications   No medications on file  Discontinued Medications   No medications on file     Physical Exam:  Vitals:   01/16/18 1135  BP: 124/72  Pulse: 73  Temp: 98 F (36.7 C)  TempSrc: Oral  SpO2: 96%  Weight: 117 lb 12.8 oz (53.4 kg)  Height: 5\' 5"  (1.651 m)   Body mass index is 19.6 kg/m.  Physical Exam  Constitutional: He is oriented to person, place, and time. He appears well-developed and well-nourished. No distress.  HENT:  Head: Normocephalic and atraumatic.  Mouth/Throat: Oropharynx is clear and moist. No oropharyngeal exudate.  Eyes: Pupils are equal, round, and reactive to light. Conjunctivae and EOM are normal.  Neck: Normal range of motion. Neck supple. No JVD present.  Cardiovascular: Normal rate,  regular rhythm and normal heart sounds.  Pulmonary/Chest: Effort normal and breath sounds normal. No respiratory distress. He has no wheezes. He exhibits no tenderness.  Abdominal: Soft. Bowel sounds are normal. He exhibits no distension. There is no tenderness.  Musculoskeletal: He exhibits no edema or tenderness.  Neurological: He is alert and oriented to person, place, and time.  Skin: Skin is warm and dry. He is not diaphoretic.  Psychiatric: He has a normal mood and affect.    Labs reviewed: Basic Metabolic Panel: Recent Labs    12/05/17 0428 12/06/17 0349 12/07/17 0249 12/20/17 0944  NA 135 135 135 137  K 3.0* 3.8 4.0 4.4  CL 95* 99* 100* 102  CO2 30 26 25 26   GLUCOSE 105* 117* 117* 212*  BUN 20 15 15  29*  CREATININE 3.74* 3.30* 3.02* 3.59*  CALCIUM 7.9* 8.0* 8.7* 9.0  MG 0.7* 1.3* 2.0  --    Liver Function Tests: Recent Labs    02/08/17 0803 02/09/17 0616 11/29/17 1855 12/20/17 0944  AST 16 18 12* 13  ALT 12* 15* 12* 12  ALKPHOS 112 112 94  --   BILITOT 0.4 0.4 0.6 0.5  PROT 5.0* 5.5* 6.9 6.6  ALBUMIN 2.9* 3.0* 4.3  --    Recent Labs    11/29/17 1855  LIPASE 39   No results for input(s): AMMONIA in the last 8760 hours. CBC: Recent Labs    12/06/17 0349 12/07/17 0249 12/20/17 0944  WBC 5.0 4.5 4.4  NEUTROABS 2.5 2.0 2,187  HGB 8.3* 8.4* 9.0*  HCT 26.1* 26.9* 28.2*  MCV 85.9 85.9 86.5  PLT 82* 81* 106*   Lipid Panel: Recent Labs    12/20/17 0944  CHOL 188  HDL 32*  LDLCALC 118*  TRIG 269*  CHOLHDL 5.9*   TSH: No results for input(s): TSH in the last 8760 hours. A1C: Lab Results  Component Value Date   HGBA1C 6.2 (H) 12/20/2017     Assessment/Plan 1. Shortness of breath -new onset shortness of breath over the last week. Without cough or congestion. Pt is immunocompromised due to hx of kidney transplant on anti-rejection medication and at risk for infection.  - CBC with Differential/Platelets rule out anemia - COMPLETE METABOLIC  PANEL WITH GFR - DG Chest 2 View- rule out Pneumonia - EKG 12-Lead- without acute change. NSR rate 73 -pt ambulated in hallway and became mildly short of breath, reports this is an improvement to prior. O2 maintained at 90%  2. Diarrhea, unspecified type -pt hospitalized in April due to AKI due to diarrhea from enteropathogenic e coli, continues to have loose stool at this time.  - Clostridium difficile Toxin B, Qualitative, Real-Time PCR([facility]); Future - Ova and Parasite Exam; Future - Stool culture; Future  Next appt: as scheduled, to go to ED if symptoms worsen [name]. [place], [facility] [phone]

## 2018-01-16 NOTE — Patient Instructions (Signed)
Will get labs today To get chest xray once you leave here Will also need stool samples, you will get containers in lab, bring back to office once you have obtained them.

## 2018-01-17 LAB — DIFF, MANUAL
Absolute Lymphocytes: 1104 cells/uL (ref 850–3900)
Basophils Absolute: 78 cells/uL (ref 0–200)
Basophils Relative: 2 %
EOS ABS: 234 {cells}/uL (ref 15–500)
Eosinophils Relative: 6 %
MONOS PCT: 6.1 %
NEUTROS ABS: 2246 {cells}/uL (ref 1500–7800)
Neutrophils Relative %: 57.6 %
TOTAL LYMPHOCYTE: 28.3 %
WBC mixed population: 238 cells/uL (ref 200–950)

## 2018-01-17 LAB — COMPLETE METABOLIC PANEL WITH GFR
AG RATIO: 1.9 (calc) (ref 1.0–2.5)
ALKALINE PHOSPHATASE (APISO): 60 U/L (ref 40–115)
ALT: 3 U/L — ABNORMAL LOW (ref 9–46)
AST: 7 U/L — AB (ref 10–40)
Albumin: 4.4 g/dL (ref 3.6–5.1)
BILIRUBIN TOTAL: 0.3 mg/dL (ref 0.2–1.2)
BUN/Creatinine Ratio: 12 (calc) (ref 6–22)
BUN: 71 mg/dL — ABNORMAL HIGH (ref 7–25)
CO2: 13 mmol/L — ABNORMAL LOW (ref 20–32)
Calcium: 11 mg/dL — ABNORMAL HIGH (ref 8.6–10.3)
Chloride: 108 mmol/L (ref 98–110)
Creat: 5.86 mg/dL — ABNORMAL HIGH (ref 0.60–1.35)
GFR, Est African American: 12 mL/min/{1.73_m2} — ABNORMAL LOW (ref >=60)
GFR, Est Non African American: 10 mL/min/{1.73_m2} — ABNORMAL LOW (ref >=60)
GLOBULIN: 2.3 g/dL (ref 1.9–3.7)
Glucose, Bld: 229 mg/dL — ABNORMAL HIGH (ref 65–139)
POTASSIUM: 4.7 mmol/L (ref 3.5–5.3)
SODIUM: 131 mmol/L — AB (ref 135–146)
Total Protein: 6.7 g/dL (ref 6.1–8.1)

## 2018-01-17 LAB — CBC WITH DIFFERENTIAL/PLATELET
HCT: 22.8 % — ABNORMAL LOW (ref 38.5–50.0)
Hemoglobin: 7.3 g/dL — ABNORMAL LOW (ref 13.2–17.1)
MCH: 28 pg (ref 27.0–33.0)
MCHC: 32 g/dL (ref 32.0–36.0)
MCV: 87.4 fL (ref 80.0–100.0)
MPV: 12.4 fL (ref 7.5–12.5)
Platelets: 104 10*3/uL — ABNORMAL LOW (ref 140–400)
RBC: 2.61 10*6/uL — ABNORMAL LOW (ref 4.20–5.80)
RDW: 14 % (ref 11.0–15.0)
WBC: 3.9 10*3/uL (ref 3.8–10.8)

## 2018-01-18 ENCOUNTER — Encounter (HOSPITAL_COMMUNITY): Payer: Self-pay | Admitting: Emergency Medicine

## 2018-01-18 ENCOUNTER — Inpatient Hospital Stay (HOSPITAL_COMMUNITY)
Admission: EM | Admit: 2018-01-18 | Discharge: 2018-01-21 | DRG: 683 | Disposition: A | Discharge status: Home / Self Care | Payer: Medicare Other | Attending: Internal Medicine | Admitting: Internal Medicine

## 2018-01-18 ENCOUNTER — Other Ambulatory Visit: Payer: Self-pay

## 2018-01-18 ENCOUNTER — Emergency Department (HOSPITAL_COMMUNITY): Payer: Medicare Other

## 2018-01-18 DIAGNOSIS — N2581 Secondary hyperparathyroidism of renal origin: Secondary | ICD-10-CM | POA: Diagnosis not present

## 2018-01-18 DIAGNOSIS — Z940 Kidney transplant status: Secondary | ICD-10-CM

## 2018-01-18 DIAGNOSIS — E860 Dehydration: Secondary | ICD-10-CM | POA: Diagnosis not present

## 2018-01-18 DIAGNOSIS — N179 Acute kidney failure, unspecified: Secondary | ICD-10-CM | POA: Diagnosis not present

## 2018-01-18 DIAGNOSIS — N185 Chronic kidney disease, stage 5: Secondary | ICD-10-CM | POA: Diagnosis not present

## 2018-01-18 DIAGNOSIS — D631 Anemia in chronic kidney disease: Secondary | ICD-10-CM | POA: Diagnosis present

## 2018-01-18 DIAGNOSIS — E1122 Type 2 diabetes mellitus with diabetic chronic kidney disease: Secondary | ICD-10-CM | POA: Diagnosis present

## 2018-01-18 DIAGNOSIS — E119 Type 2 diabetes mellitus without complications: Secondary | ICD-10-CM

## 2018-01-18 DIAGNOSIS — E872 Acidosis, unspecified: Secondary | ICD-10-CM | POA: Diagnosis present

## 2018-01-18 DIAGNOSIS — Z794 Long term (current) use of insulin: Secondary | ICD-10-CM

## 2018-01-18 DIAGNOSIS — N189 Chronic kidney disease, unspecified: Secondary | ICD-10-CM

## 2018-01-18 DIAGNOSIS — D649 Anemia, unspecified: Secondary | ICD-10-CM

## 2018-01-18 DIAGNOSIS — Z7982 Long term (current) use of aspirin: Secondary | ICD-10-CM | POA: Diagnosis not present

## 2018-01-18 DIAGNOSIS — I428 Other cardiomyopathies: Secondary | ICD-10-CM | POA: Diagnosis not present

## 2018-01-18 DIAGNOSIS — E878 Other disorders of electrolyte and fluid balance, not elsewhere classified: Secondary | ICD-10-CM | POA: Diagnosis present

## 2018-01-18 DIAGNOSIS — I120 Hypertensive chronic kidney disease with stage 5 chronic kidney disease or end stage renal disease: Secondary | ICD-10-CM | POA: Diagnosis present

## 2018-01-18 DIAGNOSIS — Z79899 Other long term (current) drug therapy: Secondary | ICD-10-CM | POA: Diagnosis not present

## 2018-01-18 DIAGNOSIS — N170 Acute kidney failure with tubular necrosis: Secondary | ICD-10-CM | POA: Diagnosis not present

## 2018-01-18 DIAGNOSIS — D696 Thrombocytopenia, unspecified: Secondary | ICD-10-CM | POA: Diagnosis present

## 2018-01-18 DIAGNOSIS — D638 Anemia in other chronic diseases classified elsewhere: Secondary | ICD-10-CM | POA: Diagnosis present

## 2018-01-18 DIAGNOSIS — R197 Diarrhea, unspecified: Secondary | ICD-10-CM

## 2018-01-18 DIAGNOSIS — I10 Essential (primary) hypertension: Secondary | ICD-10-CM | POA: Diagnosis present

## 2018-01-18 DIAGNOSIS — N184 Chronic kidney disease, stage 4 (severe): Secondary | ICD-10-CM

## 2018-01-18 LAB — CBC
HCT: 27.2 % — ABNORMAL LOW (ref 39.0–52.0)
HEMATOCRIT: 23.9 % — AB (ref 39.0–52.0)
Hemoglobin: 7.5 g/dL — ABNORMAL LOW (ref 13.0–17.0)
Hemoglobin: 8.7 g/dL — ABNORMAL LOW (ref 13.0–17.0)
MCH: 27.9 pg (ref 26.0–34.0)
MCH: 28.4 pg (ref 26.0–34.0)
MCHC: 31.4 g/dL (ref 30.0–36.0)
MCHC: 32 g/dL (ref 30.0–36.0)
MCV: 88.8 fL (ref 78.0–100.0)
MCV: 88.9 fL (ref 78.0–100.0)
PLATELETS: 81 10*3/uL — AB (ref 150–400)
Platelets: 103 10*3/uL — ABNORMAL LOW (ref 150–400)
RBC: 2.69 MIL/uL — AB (ref 4.22–5.81)
RBC: 3.06 MIL/uL — AB (ref 4.22–5.81)
RDW: 14.6 % (ref 11.5–15.5)
RDW: 13.8 % (ref 11.5–15.5)
WBC: 3.9 10*3/uL — AB (ref 4.0–10.5)
WBC: 3.5 10*3/uL — ABNORMAL LOW (ref 4.0–10.5)

## 2018-01-18 LAB — COMPREHENSIVE METABOLIC PANEL
ALT: 7 U/L — ABNORMAL LOW (ref 17–63)
ANION GAP: 8 (ref 5–15)
AST: 11 U/L — AB (ref 15–41)
Albumin: 4.4 g/dL (ref 3.5–5.0)
Alkaline Phosphatase: 54 U/L (ref 38–126)
BILIRUBIN TOTAL: 0.4 mg/dL (ref 0.3–1.2)
BUN: 76 mg/dL — AB (ref 6–20)
CHLORIDE: 114 mmol/L — AB (ref 101–111)
CO2: 12 mmol/L — ABNORMAL LOW (ref 22–32)
Calcium: 10.9 mg/dL — ABNORMAL HIGH (ref 8.9–10.3)
Creatinine, Ser: 5.75 mg/dL — ABNORMAL HIGH (ref 0.61–1.24)
GFR, EST AFRICAN AMERICAN: 12 mL/min — AB (ref >60)
GFR, EST NON AFRICAN AMERICAN: 10 mL/min — AB (ref >60)
Glucose, Bld: 239 mg/dL — ABNORMAL HIGH (ref 65–99)
Potassium: 4.4 mmol/L (ref 3.5–5.1)
Sodium: 134 mmol/L — ABNORMAL LOW (ref 135–145)
TOTAL PROTEIN: 6.7 g/dL (ref 6.5–8.1)

## 2018-01-18 LAB — GLUCOSE, CAPILLARY
GLUCOSE-CAPILLARY: 129 mg/dL — AB (ref 65–99)
Glucose-Capillary: 121 mg/dL — ABNORMAL HIGH (ref 65–99)

## 2018-01-18 LAB — C DIFFICILE QUICK SCREEN W PCR REFLEX
C Diff antigen: NEGATIVE
C Diff interpretation: NOT DETECTED
C Diff toxin: NEGATIVE

## 2018-01-18 LAB — TROPONIN I: Troponin I: <0.03 ng/mL (ref <0.03)

## 2018-01-18 LAB — PREPARE RBC (CROSSMATCH)

## 2018-01-18 MED ORDER — INSULIN GLARGINE 100 UNIT/ML ~~LOC~~ SOLN
5.0000 [IU] | Freq: Every day | SUBCUTANEOUS | Status: DC
  Administered 2018-01-18 – 2018-01-20 (×3): 5 [IU] via SUBCUTANEOUS
  Filled 2018-01-18 (×4): qty 0.05

## 2018-01-18 MED ORDER — SODIUM BICARBONATE 650 MG PO TABS
1300.0000 mg | ORAL_TABLET | Freq: Three times a day (TID) | ORAL | Status: DC
  Administered 2018-01-18 – 2018-01-20 (×6): 1300 mg via ORAL
  Filled 2018-01-18 (×9): qty 2

## 2018-01-18 MED ORDER — SODIUM CHLORIDE 0.9 % IV SOLN: Freq: Once | INTRAVENOUS | Status: DC

## 2018-01-18 MED ORDER — ONDANSETRON HCL 4 MG PO TABS: 4.0000 mg | ORAL_TABLET | Freq: Four times a day (QID) | ORAL | Status: DC | PRN

## 2018-01-18 MED ORDER — CALCITRIOL 0.5 MCG PO CAPS
0.5000 ug | ORAL_CAPSULE | Freq: Every day | ORAL | Status: DC
  Administered 2018-01-19 – 2018-01-21 (×3): 0.5 ug via ORAL
  Filled 2018-01-18 (×3): qty 1

## 2018-01-18 MED ORDER — ONDANSETRON HCL 4 MG/2ML IJ SOLN: 4.0000 mg | Freq: Four times a day (QID) | INTRAMUSCULAR | Status: DC | PRN

## 2018-01-18 MED ORDER — ACETAMINOPHEN 325 MG PO TABS: 650.0000 mg | ORAL_TABLET | Freq: Four times a day (QID) | ORAL | Status: DC | PRN

## 2018-01-18 MED ORDER — ASPIRIN EC 81 MG PO TBEC
81.0000 mg | DELAYED_RELEASE_TABLET | Freq: Every day | ORAL | Status: DC
  Administered 2018-01-19 – 2018-01-21 (×3): 81 mg via ORAL
  Filled 2018-01-18 (×3): qty 1

## 2018-01-18 MED ORDER — TACROLIMUS 1 MG PO CAPS
6.0000 mg | ORAL_CAPSULE | Freq: Every morning | ORAL | Status: DC
  Administered 2018-01-19 – 2018-01-21 (×3): 6 mg via ORAL
  Filled 2018-01-18 (×3): qty 6

## 2018-01-18 MED ORDER — CALCIUM CARBONATE 1250 (500 CA) MG PO TABS
1.0000 | ORAL_TABLET | Freq: Three times a day (TID) | ORAL | Status: DC
  Administered 2018-01-19 – 2018-01-21 (×8): 500 mg via ORAL
  Filled 2018-01-18 (×8): qty 1

## 2018-01-18 MED ORDER — FOLIC ACID 1 MG PO TABS
1.0000 mg | ORAL_TABLET | Freq: Every day | ORAL | Status: DC
  Administered 2018-01-19 – 2018-01-21 (×3): 1 mg via ORAL
  Filled 2018-01-18 (×3): qty 1

## 2018-01-18 MED ORDER — LACTATED RINGERS IV SOLN
INTRAVENOUS | Status: DC
  Administered 2018-01-18 – 2018-01-19 (×3): via INTRAVENOUS

## 2018-01-18 MED ORDER — PANTOPRAZOLE SODIUM 40 MG PO TBEC
40.0000 mg | DELAYED_RELEASE_TABLET | Freq: Every day | ORAL | Status: DC
  Administered 2018-01-18 – 2018-01-20 (×3): 40 mg via ORAL
  Filled 2018-01-18 (×3): qty 1

## 2018-01-18 MED ORDER — ACETAMINOPHEN 650 MG RE SUPP: 650.0000 mg | Freq: Four times a day (QID) | RECTAL | Status: DC | PRN

## 2018-01-18 MED ORDER — ATORVASTATIN CALCIUM 10 MG PO TABS
10.0000 mg | ORAL_TABLET | Freq: Every day | ORAL | Status: DC
  Administered 2018-01-18 – 2018-01-20 (×3): 10 mg via ORAL
  Filled 2018-01-18 (×3): qty 1

## 2018-01-18 MED ORDER — MYCOPHENOLATE SODIUM 180 MG PO TBEC
720.0000 mg | DELAYED_RELEASE_TABLET | Freq: Two times a day (BID) | ORAL | Status: DC
  Administered 2018-01-18 – 2018-01-21 (×6): 720 mg via ORAL
  Filled 2018-01-18 (×6): qty 4

## 2018-01-18 MED ORDER — DAPSONE 25 MG PO TABS
50.0000 mg | ORAL_TABLET | Freq: Every day | ORAL | Status: DC
  Administered 2018-01-19 – 2018-01-21 (×3): 50 mg via ORAL
  Filled 2018-01-18 (×3): qty 2

## 2018-01-18 MED ORDER — ZOLPIDEM TARTRATE 5 MG PO TABS: 5.0000 mg | ORAL_TABLET | Freq: Every evening | ORAL | Status: DC | PRN

## 2018-01-18 MED ORDER — TACROLIMUS 1 MG PO CAPS
5.0000 mg | ORAL_CAPSULE | Freq: Every day | ORAL | Status: DC
  Administered 2018-01-18 – 2018-01-20 (×3): 5 mg via ORAL
  Filled 2018-01-18 (×3): qty 5

## 2018-01-18 MED ORDER — SODIUM CHLORIDE 0.9 % IV SOLN
INTRAVENOUS | Status: DC
  Administered 2018-01-18: 16:00:00 via INTRAVENOUS

## 2018-01-18 MED ORDER — TACROLIMUS 1 MG PO CAPS: 5.0000 mg | ORAL_CAPSULE | ORAL | Status: DC

## 2018-01-18 MED ORDER — HYDROXYZINE HCL 25 MG PO TABS
25.0000 mg | ORAL_TABLET | Freq: Three times a day (TID) | ORAL | Status: DC | PRN
Start: 2018-01-18 — End: 2018-01-21

## 2018-01-18 MED ORDER — INSULIN DEGLUDEC 100 UNIT/ML ~~LOC~~ SOPN: 5.0000 [IU] | PEN_INJECTOR | Freq: Every day | SUBCUTANEOUS | Status: DC

## 2018-01-18 MED ORDER — NEPRO/CARBSTEADY PO LIQD
237.0000 mL | Freq: Three times a day (TID) | ORAL | Status: DC | PRN
  Filled 2018-01-18: qty 237

## 2018-01-18 MED ORDER — CAMPHOR-MENTHOL 0.5-0.5 % EX LOTN: 1.0000 "application " | TOPICAL_LOTION | Freq: Three times a day (TID) | CUTANEOUS | Status: DC | PRN

## 2018-01-18 NOTE — H&P (Signed)
History and Physical    [name] MVE:[id number] DOB: Mar 09, 1971 DOA: 01/18/2018  PCP: [name], NP Consultants:  Justin Mend - nephrology; Saintclair Halsted - neurosurgery; Stanford Breed - cardiololgy Patient coming from:  Home - lives with wife and son; NOK: wife, [phone]  Chief Complaint:  Sent by PCP  HPI: [name] is a 47 y.o. male with medical history significant of NICM; ESRD on HD now s/p renal transplant; HTN; DM; and admission in 4/19 for diarrhea secondary to enteropathogenic E Coli now presenting with acute on chronic renal insufficiency and symptomatic anemia.  SOB, particularly with exertion - difficulty even getting to/from the bathroom.  Diarrhea but more solid stools than prior, lasting 2-3 weeks.  He has been having 3 stools daily, most right after eating/drinking - but this is no different from his usual.  He is "bundled up, freezing, like he's on the [place] in his birthday suit."  He is voiding as usual.  He reports that he was supposed to get erythropoietin injections after his last hospitalization but this has not been started.   ED Course:  Baseline anemia from CKD.  ?symptomatic anemia.  Worsening renal function.  Dr. Posey Pronto (nephrology) recommends hospitalization for 2 units PRBC and observation.  Review of Systems: As per HPI; otherwise review of systems reviewed and negative.   Ambulatory Status:  Ambulates with a cane after back surgery  Past Medical History:  Diagnosis Date  . Anemia   . CKD (chronic kidney disease), stage III ([place])   . Diabetes mellitus 12/2008   A1C 8.3 12/23/08, 24hr urine protein 2793 mg/day, SPEP neg, proliferative BDR s/p photocoagulation 08/2009 done at [facility] and f/u by DR Groat  . ESRD (end stage renal disease) on dialysis ([place])    1.Initiated HD via right per cate 12/30/2008 (MWF schedule at [facility]) 2.Left upper extremity A-V fistula by Dr Oneida Alar 12/30/2008 3.Aemia: Received Venofer 12/30/08 Ferritin 179 % sat 12 12/23/08, 4.  Secondary Hyperparathyroidism- PTH 188, P 5.4, Ca 9.2 12/2008  . Hemodialysis-associated hypotension    coreg d/c'd  . Herniated lumbar disc without myelopathy   . History of blood transfusion   . Hyperparathyroidism due to renal insufficiency ([place])   . Hypertension    resloved after initating HD, now with post HD hypotension  . Hypocalcemia   . Hypomagnesemia   . Kidney transplant as cause of abnormal reaction or later complication [id number]   [facility]  . Non-ischemic cardiomyopathy ([place])    EF 20-25% 12/23/2008>>EF 40-45% 03/2009>>EF ??? 7/?/2011  . Occult blood positive stool   . Psoriasis    scalp  . Thrombocytopenia ([place])   . Vitamin B 12 deficiency     Past Surgical History:  Procedure Laterality Date  . AV FISTULA PLACEMENT     left  . AV FISTULA PLACEMENT  09/12/2012   Procedure: INSERTION OF ARTERIOVENOUS (AV) GORE-TEX GRAFT ARM;  Surgeon: Elam Dutch, MD;  Location: [facility];  Service: Vascular;  Laterality: Left;  KIDNEY TRANSPLANT FAILED  . CATARACT EXTRACTION, BILATERAL    . EYE SURGERY    . INSERTION OF DIALYSIS CATHETER  07/14/2012   Procedure: INSERTION OF DIALYSIS CATHETER;  Surgeon: Mal Misty, MD;  Location: [facility];  Service: Vascular;  Laterality: Right;  Ultrasound Guided Insertion of Internal Jugular Dialysis Catheter  . 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 KIDNEY TRANSPLANT  12/2013   second kidney transplant  . lumbar disc sugery  10/17/2017  . LUMBAR DISC SURGERY    . NEPHRECTOMY TRANSPLANTED ORGAN  08/11/12  . PHOTOCOAGULATION     for diabetic retinopathy 08/1999  . SHUNTOGRAM N/A 07/11/2012   Procedure: Earney Mallet;  Surgeon: Serafina Mitchell, MD;  Location: [facility] CATH LAB;  Service: Cardiovascular;  Laterality: N/A;    Social History   Socioeconomic History  . Marital status: Married    Spouse name: Not on file  . Number of children: 2  . Years of education: Not on file  . Highest education level: Not on file    Occupational History  . Occupation: disabled  Social Needs  . Financial resource strain: Not on file  . Food insecurity:    Worry: Not on file    Inability: Not on file  . Transportation needs:    Medical: Not on file    Non-medical: Not on file  Tobacco Use  . Smoking status: Never Smoker  . Smokeless tobacco: Never Used  Substance and Sexual Activity  . Alcohol use: No    Alcohol/week: 0.0 oz  . Drug use: No  . Sexual activity: Not on file  Lifestyle  . Physical activity:    Days per week: Not on file    Minutes per session: Not on file  . Stress: Not on file  Relationships  . Social connections:    Talks on phone: Not on file    Gets together: Not on file    Attends religious service: Not on file    Active member of club or organization: Not on file    Attends meetings of clubs or organizations: Not on file    Relationship status: Not on file  . Intimate partner violence:    Fear of current or ex partner: Not on file    Emotionally abused: Not on file    Physically abused: Not on file    Forced sexual activity: Not on file  Other Topics Concern  . Not on file  Social History Narrative   Social History      Diet?       Do you drink/eat things with caffeine? yes      Marital status?            married                        What year were you married?       Do you live in a house, apartment, assisted living, condo, trailer, etc.? apartment      Is it one or more stories? one      How many persons live in your home? 2      Do you have any pets in your home? (please list) no      Highest level of education completed?       Current or past profession: bus owner      Do you exercise?            yes                          Type & how often? Walking daily      Advanced Directives      Do you have a living will? no      Do you have a DNR form?            no  If not, do you want to discuss one?      Do you have signed POA/HPOA for  forms? no      Functional Status      Do you have difficulty bathing or dressing yourself? yes      Do you have difficulty preparing food or eating? no      Do you have difficulty managing your medications? yes      Do you have difficulty managing your finances? no      Do you have difficulty affording your medications? yes    Allergies  Allergen Reactions  . Lactose Intolerance (Gi) Diarrhea    No whole milk!!  . Other Other (See Comments)    Soy milk burns his tongue  . Pollen Extract Other (See Comments)    Itching, watery eyes and sneezing.  . Tape Rash    PLEASE USE COBAN WRAP OR PAPER TAPE     Family History  Problem Relation Age of Onset  . Diabetes Father   . Kidney disease Father   . Other Father        amputation  . Hypertension Mother   . Dementia Mother   . Diabetes Sister   . Hypertension Sister   . Diabetes Sister   . Kidney disease Sister   . Diabetes Sister   . Hypertension Brother   . Diabetes Brother   . CAD Neg Hx   . Sudden Cardiac Death Neg Hx   . Congestive Heart Failure Neg Hx   . Colon cancer Neg Hx   . Rectal cancer Neg Hx   . Esophageal cancer Neg Hx   . Liver cancer Neg Hx     Prior to Admission medications   Medication Sig Start Date End Date Taking? Authorizing Provider  acetaminophen (TYLENOL) 500 MG tablet Take 1,000 mg by mouth every 6 (six) hours as needed for mild pain.    [provider]  aspirin EC 81 MG tablet Take 81 mg by mouth daily.    [provider]  atorvastatin (LIPITOR) 10 MG tablet Take one tablet by mouth at bedtime for cholesterol. 12/27/17   [name], NP  calcitRIOL (ROCALTROL) 0.5 MCG capsule Take 1 capsule by mouth daily.  06/03/15   [provider]  calcium carbonate (OS-CAL - DOSED IN MG OF ELEMENTAL CALCIUM) 1250 MG tablet Take 1 tablet by mouth 3 (three) times daily with meals.    [provider]  carvedilol (COREG) 6.25 MG tablet Take 1 tablet (6.25 mg total)  by mouth 2 (two) times daily with a meal. 12/20/17   [name], NP  dapsone 25 MG tablet Take 50 mg by mouth daily.    [provider]  folic acid (FOLVITE) 1 MG tablet Take 1 mg by mouth daily.  06/24/15   [provider]  glucose blood (ACCU-CHEK AVIVA) test strip Check blood sugar 4 times daily and before bed time. 12/07/17   Georgette Shell, MD  insulin aspart (NOVOLOG) 100 UNIT/ML FlexPen Inject 12 Units into the skin 3 (three) times daily before meals. 12/07/17   Georgette Shell, MD  insulin glargine (LANTUS) 100 UNIT/ML injection Inject 0.05 mLs (5 Units total) into the skin at bedtime. 12/07/17   Georgette Shell, MD  Insulin Pen Needle 31G X 5 MM MISC 5 Units/oz/day by Does not apply route at bedtime. Use 4 times daily to inject insulin to skin 12/07/17   Georgette Shell, MD  ketoconazole (  NIZORAL) 2 % shampoo Apply topically 2 (two) times a week. Wash hair and beard.  Leave shampoo on skin for 5 mins before washing off. 04/24/14   Kelby Aline, MD  Lancets (ACCU-CHEK MULTICLIX) lancets Use as directed 12/07/17   Georgette Shell, MD  mycophenolate (MYFORTIC) 180 MG EC tablet Take 720 mg by mouth See admin instructions. 0800, 2000    [provider]  omeprazole (PRILOSEC) 20 MG capsule Take 1 capsule (20 mg total) by mouth daily. 12/20/17   [name], NP  sodium bicarbonate 650 MG tablet Take 1,300 mg by mouth 3 (three) times daily.  06/24/15   [provider]  tacrolimus (PROGRAF) 1 MG capsule Take 5-6 mg by mouth See admin instructions. 6 mg in the morning and 5 mg in the evening - 1000, 2200    [provider]    Physical Exam: Vitals:   01/18/18 1600 01/18/18 1604 01/18/18 1615 01/18/18 1632  BP: 121/73  (!) 133/108 107/73  Pulse: 69  (!) 102 73  Resp: 14  19 16   Temp:  98.5 F (36.9 C)  98.4 F (36.9 C)  TempSrc:  Oral  Oral  SpO2: 100%  (!) 81% 100%  Weight:      Height:         General:  Appears  calm and comfortable and is NAD; he is bundled up in blankets Eyes:  PERRL, EOMI, normal lids, iris; pale conjunctivae ENT:  grossly normal hearing, lips & tongue, mildly dry mm; poor dentition Neck:  no LAD, masses or thyromegaly; no carotid bruits Cardiovascular:  RRR, no m/r/g. No LE edema.  Respiratory:   CTA bilaterally with no wheezes/rales/rhonchi.  Normal respiratory effort. Abdomen:  soft, NT, ND, NABS; multiple surgical scars Skin:  no rash or induration seen on limited exam Musculoskeletal:  grossly normal tone BUE/BLE, good ROM, no bony abnormality Psychiatric:  grossly normal mood and affect, speech fluent and appropriate, AOx3 Neurologic:  CN 2-12 grossly intact, moves all extremities in coordinated fashion, sensation intact    Radiological Exams on Admission: Dg Chest 2 View  Result Date: 01/18/2018 CLINICAL DATA:  Shortness of breath, chest pain, dizziness, weakness, diabetes mellitus, renal failure, hypertension EXAM: CHEST - 2 VIEW COMPARISON:  02/08/2017 FINDINGS: Rotated to the LEFT. Normal heart size, mediastinal contours, and pulmonary vascularity. Lungs clear. No pleural effusion or pneumothorax. Bones unremarkable. IMPRESSION: No acute abnormalities. Electronically Signed   By: Lavonia Dana M.D.   On: 01/18/2018 11:58    EKG: Independently reviewed.  NSR with rate 75; nonspecific ST changes with no evidence of acute ischemia   Labs on Admission: I have personally reviewed the available labs and imaging studies at the time of the admission.  Pertinent labs:   CO2 12 Glucose 239 BUN 76/Creatinine 5.75/GFR 12; 71/5.86/12 on 6/3; 29/3.59/22 on 5/7 (appears to be his baseline) Troponin <0.03 WBC 3.9 Hgb 7.5; 7.3 on 6/3; 9.0 on 5/7; baseline appears to be about 8  Assessment/Plan Principal Problem:   Acute renal failure superimposed on stage 5 chronic kidney disease, not on chronic dialysis ([place]) Active Problems:   DM2 (diabetes mellitus, type 2) ([place])   Anemia  in chronic kidney disease   Essential hypertension, benign   Deceased-donor kidney transplant   Diarrhea   Metabolic acidosis, NAG, failure of bicarbonate regeneration   Acute renal failure on CKD with metabolic acidosis -Baseline creatinine is about 3.5.   -Today's creatinine is 5.75.  -Likely due to prerenal failure  secondary to dehydration in the setting of poor PO intake and ongoing loose and persistent stools. -Anemia is likely exacerbating this issue -IVF  -Blood transfusion, as below  -Follow up renal function by BMP -Avoid nephrotoxic agents -Acidosis likely secondary to renal failure, continue bicarb and rehydration -ER appears to have ordered nephrology consult,  But this has not been done yet -Nephrology prn order set used  Anemia -Patient/wife report that plan was for outpatient initiation of Epo - which has not happened -Hgb currently slightly less than baseline -Current symptoms do sound c/w symptomatic anemia - although progressive renal failure could also cause symptoms -ER is transfusing 2 units PRBC -Recheck post-transfusion CBC -Check hemoccult - although fairly low current suspicion for GI bleeding as the source  Diarrhea -Liquid brown stools collected by his wife at home  -Recent h/o enteropathogenic E coli -C diff negative -GI panel and OP pending (patient is immunocompromised)  S/p renal transplant -Continue immunosuppressants -Unfortunately, it appears that he is again progressing toward ESRD with need for HD -Will follow renal function -Persistent acidosis would also lead to consideration of resumption of HD  HTN -Hold Coreg due to low BPs in ER -Consider resumption tomorrow based on BPs overnight     DVT prophylaxis:  SCDs Code Status: Full - confirmed with patient/family Family Communication: Wife present throughout evaluation Disposition Plan: Home once clinically improved Consults called: Nephrology (by ER) Admission status: Admit - It  is my clinical opinion that admission to INPATIENT is reasonable and necessary because of the expectation that this patient will require hospital care that crosses at least 2 midnights to treat this condition based on the medical complexity of the problems presented.  Given the aforementioned information, the predictability of an adverse outcome is felt to be significant.    Karmen Bongo MD [facility]  If note is complete, please contact covering daytime or nighttime physician. [email or web address] Password [email or web address]  01/18/2018, 5:50 PM

## 2018-01-18 NOTE — ED Provider Notes (Addendum)
[facility] EMERGENCY DEPARTMENT Provider Note   CSN: [id number] Arrival date & time: 01/18/18  1055     History   Chief Complaint Chief Complaint  Patient presents with  . Abnormal Lab    HPI [name] is a 47 y.o. male.  HPI  The pt is a 47 y/o male -with a known history of end-stage renal disease who has had a renal transplant approximately 4 years ago, he has poorly controlled diabetes, he had presented to the emergency department with diarrhea, was diagnosed with enteropathogenic E. coli after eating at a local restaurant.  He then developed an acute kidney injury, was admitted to the hospital and during the course of his admission received antibiotics for the E. coli infection.  He reports that upon discharge his diarrhea had improved in 3 weeks later it seemed to recur.  He was seen at his family doctor's office on Monday, they performed blood work which showed anemia with a hemoglobin of approximately 7.5, some progressive renal dysfunction, they had some difficulty getting a hold of the patient but when I got a hold of them today they were on the city bus, they were advised to get off the bus and called 911 to get back to the hospital immediately for admission.  The patient does report that he has had some progressive shortness of breath on exertion and some mild generalized weakness but denies any blood in his stools.  He has a history of chronic anemia, he was transfused while he was in the hospital in April, he had received shots to boost his marrow production in the past but that has stopped since his primary nephrologist had retired.  The patient does not actually get dialysis, he is making urine, he denies fevers or abdominal pain and denies coughing or chest pain.  There is no swelling.  He has brought with him several samples of stool that his family doctor asked him to collect.   Past Medical History:  Diagnosis Date  . Anemia   . CKD (chronic kidney  disease), stage III ([place])   . Diabetes mellitus 12/2008   A1C 8.3 12/23/08, 24hr urine protein 2793 mg/day, SPEP neg, proliferative BDR s/p photocoagulation 08/2009 done at [facility] and f/u by DR Groat  . ESRD (end stage renal disease) on dialysis ([place])    1.Initiated HD via right per cate 12/30/2008 (MWF schedule at [facility]) 2.Left upper extremity A-V fistula by Dr Oneida Alar 12/30/2008 3.Aemia: Received Venofer 12/30/08 Ferritin 179 % sat 12 12/23/08, 4. Secondary Hyperparathyroidism- PTH 188, P 5.4, Ca 9.2 12/2008  . Hemodialysis-associated hypotension    coreg d/c'd  . Herniated lumbar disc without myelopathy   . History of blood transfusion   . Hyperparathyroidism due to renal insufficiency ([place])   . Hypertension    resloved after initating HD, now with post HD hypotension  . Hypocalcemia   . Hypomagnesemia   . Kidney transplant as cause of abnormal reaction or later complication 8185   [facility]  . Non-ischemic cardiomyopathy ([place])    EF 20-25% 12/23/2008>>EF 40-45% 03/2009>>EF ??? 7/?/2011  . Occult blood positive stool   . Psoriasis    scalp  . Thrombocytopenia ([place])   . Vitamin B 12 deficiency     Patient Active Problem List   Diagnosis Date Noted  . Acute renal failure superimposed on stage 5 chronic kidney disease, not on chronic dialysis ([place]) 11/29/2017  . Hyponatremia 11/29/2017  . Metabolic acidosis, NAG, failure of  bicarbonate regeneration 11/29/2017  . Normocytic anemia 03/18/2017  . Heme + stool 03/18/2017  . Hyperglycemia without ketosis 02/18/2017  . Deceased-donor kidney transplant 2017-02-18  . Congestive dilated cardiomyopathy ([place]) 07/16/2015  . Abnormal cardiovascular function study 07/16/2015  . Preventative health care 04/24/2014  . Tinea capitis 04/24/2014  . Dental infection 10/16/2013  . Hyperlipidemia with target low density lipoprotein (LDL) cholesterol less than 100 mg/dL 08/29/2013  . Proliferative diabetic retinopathy(362.02) 05/24/2013  .  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 DM2 (diabetes mellitus, type 2) ([place]) 01/15/2009  . ANEMIA OF RENAL FAILURE 01/15/2009  . Essential hypertension, benign 01/15/2009  . Dilated cardiomyopathy ([place]) 01/15/2009  . End stage renal disease ([place]) 01/15/2009  . SECONDARY HYPERPARATHYROIDISM 01/15/2009    Past Surgical History:  Procedure Laterality Date  . AV FISTULA PLACEMENT     left  . AV FISTULA PLACEMENT  09/12/2012   Procedure: INSERTION OF ARTERIOVENOUS (AV) GORE-TEX GRAFT ARM;  Surgeon: Elam Dutch, MD;  Location: [facility];  Service: Vascular;  Laterality: Left;  KIDNEY TRANSPLANT FAILED  . CATARACT EXTRACTION, BILATERAL    . EYE SURGERY    . INSERTION OF DIALYSIS CATHETER  07/14/2012   Procedure: INSERTION OF DIALYSIS CATHETER;  Surgeon: Mal Misty, MD;  Location: [facility];  Service: Vascular;  Laterality: Right;  Ultrasound Guided Insertion of Internal Jugular Dialysis Catheter  . 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 KIDNEY TRANSPLANT  12/2013   second kidney transplant  . lumbar disc sugery   10/17/2017  . LUMBAR DISC SURGERY    . NEPHRECTOMY TRANSPLANTED ORGAN  08/11/12  . PHOTOCOAGULATION     for diabetic retinopathy 08/1999  . SHUNTOGRAM N/A 07/11/2012   Procedure: Earney Mallet;  Surgeon: Serafina Mitchell, MD;  Location: [facility] CATH LAB;  Service: Cardiovascular;  Laterality: N/A;        Home Medications    Prior to Admission medications   Medication Sig Start Date End Date Taking? Authorizing Provider  acetaminophen (TYLENOL) 500 MG tablet Take 1,000 mg by mouth every 6 (six) hours as needed for mild pain.   Yes [provider]  aspirin EC 81 MG tablet Take 81 mg by mouth daily.   Yes [provider]  atorvastatin (LIPITOR) 10 MG tablet Take one tablet by mouth at bedtime for cholesterol. 12/27/17  Yes [name], NP  calcitRIOL (ROCALTROL) 0.5 MCG capsule Take 1 capsule by mouth daily.  06/03/15  Yes  [provider]  calcium carbonate (OS-CAL - DOSED IN MG OF ELEMENTAL CALCIUM) 1250 MG tablet Take 1 tablet by mouth 3 (three) times daily with meals.   Yes [provider]  carvedilol (COREG) 6.25 MG tablet Take 1 tablet (6.25 mg total) by mouth 2 (two) times daily with a meal. 12/20/17  Yes [name], NP  dapsone 25 MG tablet Take 50 mg by mouth daily.   Yes [provider]  folic acid (FOLVITE) 1 MG tablet Take 1 mg by mouth daily.  06/24/15  Yes [provider]  insulin aspart (NOVOLOG) 100 UNIT/ML FlexPen Inject 12 Units into the skin 3 (three) times daily before meals. Patient taking differently: Inject into the skin See admin instructions. Sliding scale Smart Meter 12/07/17  Yes Georgette Shell, MD  insulin degludec (TRESIBA FLEXTOUCH) 100 UNIT/ML SOPN FlexTouch Pen Inject 5 Units into the skin daily at 10 pm.   Yes [provider]  ketoconazole (NIZORAL) 2 % shampoo  Apply topically 2 (two) times a week. Wash hair and beard.  Leave shampoo on skin for 5 mins before washing off. 04/24/14  Yes Kelby Aline, MD  mycophenolate (MYFORTIC) 180 MG EC tablet Take 720 mg by mouth See admin instructions. 0800, 2000   Yes [provider]  omeprazole (PRILOSEC) 20 MG capsule Take 1 capsule (20 mg total) by mouth daily. Patient taking differently: Take 20 mg by mouth at bedtime.  12/20/17  Yes [name], NP  sodium bicarbonate 650 MG tablet Take 1,300 mg by mouth 3 (three) times daily.  06/24/15  Yes [provider]  tacrolimus (PROGRAF) 1 MG capsule Take 5-6 mg by mouth See admin instructions. 6 mg in the morning and 5 mg in the evening - 1000, 2200   Yes [provider]  glucose blood (ACCU-CHEK AVIVA) test strip Check blood sugar 4 times daily and before bed time. 12/07/17   Georgette Shell, MD  insulin glargine (LANTUS) 100 UNIT/ML injection Inject 0.05 mLs (5 Units total) into the skin at bedtime. Patient not  taking: Reported on 01/18/2018 12/07/17   Georgette Shell, MD  Insulin Pen Needle 31G X 5 MM MISC 5 Units/oz/day by Does not apply route at bedtime. Use 4 times daily to inject insulin to skin 12/07/17   Georgette Shell, MD  Lancets (ACCU-CHEK MULTICLIX) lancets Use as directed 12/07/17   Georgette Shell, MD    Family History Family History  Problem Relation Age of Onset  . Diabetes Father   . Kidney disease Father   . Other Father        amputation  . Hypertension Mother   . Dementia Mother   . Diabetes Sister   . Hypertension Sister   . Diabetes Sister   . Kidney disease Sister   . Diabetes Sister   . Hypertension Brother   . Diabetes Brother   . CAD Neg Hx   . Sudden Cardiac Death Neg Hx   . Congestive Heart Failure Neg Hx   . Colon cancer Neg Hx   . Rectal cancer Neg Hx   . Esophageal cancer Neg Hx   . Liver cancer Neg Hx     Social History Social History   Tobacco Use  . Smoking status: Never Smoker  . Smokeless tobacco: Never Used  Substance Use Topics  . Alcohol use: No    Alcohol/week: 0.0 oz  . Drug use: No     Allergies   Lactose intolerance (gi); Other; Pollen extract; and Tape   Review of Systems Review of Systems  All other systems reviewed and are negative.    Physical Exam Updated Vital Signs BP 119/72   Pulse 71   Temp 98 F (36.7 C) (Oral)   Resp 16   Ht 5\' 5"  (1.651 m)   Wt 53.1 kg (117 lb)   SpO2 100%   BMI 19.47 kg/m   Physical Exam  Constitutional: He appears well-developed and well-nourished. No distress.  HENT:  Head: Normocephalic and atraumatic.  Mouth/Throat: Oropharynx is clear and moist. No oropharyngeal exudate.  Eyes: Pupils are equal, round, and reactive to light. EOM are normal. Right eye exhibits no discharge. Left eye exhibits no discharge. No scleral icterus.  Conjunctive are pale, pupils are equal round and reactive to light  Neck: Normal range of motion. Neck supple. No JVD present. No thyromegaly  present.  Cardiovascular: Normal rate, regular rhythm, normal heart sounds and intact distal pulses. Exam reveals no  gallop and no friction rub.  No murmur heard. Pulmonary/Chest: Effort normal and breath sounds normal. No respiratory distress. He has no wheezes. He has no rales.  Abdominal: Soft. Bowel sounds are normal. He exhibits no distension and no mass. There is no tenderness.  Musculoskeletal: Normal range of motion. He exhibits no edema or tenderness.  Lymphadenopathy:    He has no cervical adenopathy.  Neurological: He is alert. Coordination normal.  Skin: Skin is warm and dry. No rash noted. No erythema.  Psychiatric: He has a normal mood and affect. His behavior is normal.  Nursing note and vitals reviewed.    ED Treatments / Results  Labs (all labs ordered are listed, but only abnormal results are displayed) Labs Reviewed  COMPREHENSIVE METABOLIC PANEL - Abnormal; Notable for the following components:      Result Value   Sodium 134 (*)    Chloride 114 (*)    CO2 12 (*)    Glucose, Bld 239 (*)    BUN 76 (*)    Creatinine, Ser 5.75 (*)    Calcium 10.9 (*)    AST 11 (*)    ALT 7 (*)    GFR calc non Af Amer 10 (*)    GFR calc Af Amer 12 (*)    All other components within normal limits  CBC - Abnormal; Notable for the following components:   WBC 3.9 (*)    RBC 2.69 (*)    Hemoglobin 7.5 (*)    HCT 23.9 (*)    Platelets 103 (*)    All other components within normal limits  OVA + PARASITE EXAM  GASTROINTESTINAL PANEL BY PCR, STOOL (REPLACES STOOL CULTURE)  C DIFFICILE QUICK SCREEN W PCR REFLEX  TROPONIN I  POC OCCULT BLOOD, ED  TYPE AND SCREEN  PREPARE RBC (CROSSMATCH)    EKG EKG Interpretation  Date/Time:  Wednesday January 18 2018 11:38:59 EDT Ventricular Rate:  75 PR Interval:  160 QRS Duration: 92 QT Interval:  348 QTC Calculation: 388 R Axis:   151 Text Interpretation:  Normal sinus rhythm Right axis deviation Anterior infarct , age undetermined  Abnormal ECG Since last tracing t wave inversions have resolved Confirmed by Noemi Chapel [phone]) on 01/18/2018 1:18:23 PM   Radiology Dg Chest 2 View  Result Date: 01/18/2018 CLINICAL DATA:  Shortness of breath, chest pain, dizziness, weakness, diabetes mellitus, renal failure, hypertension EXAM: CHEST - 2 VIEW COMPARISON:  02/08/2017 FINDINGS: Rotated to the LEFT. Normal heart size, mediastinal contours, and pulmonary vascularity. Lungs clear. No pleural effusion or pneumothorax. Bones unremarkable. IMPRESSION: No acute abnormalities. Electronically Signed   By: Lavonia Dana M.D.   On: 01/18/2018 11:58    Procedures .Critical Care Performed by: Noemi Chapel, MD Authorized by: Noemi Chapel, MD   Critical care provider statement:    Critical care time (minutes):  35   Critical care time was exclusive of:  Separately billable procedures and treating other patients and teaching time   Critical care was necessary to treat or prevent imminent or life-threatening deterioration of the following conditions:  Renal failure (severe anemia)   Critical care was time spent personally by me on the following activities:  Blood draw for specimens, development of treatment plan with patient or surrogate, discussions with consultants, evaluation of patient's response to treatment, examination of patient, obtaining history from patient or surrogate, ordering and performing treatments and interventions, ordering and review of laboratory studies, ordering and review of radiographic studies, pulse oximetry, re-evaluation of patient's  condition and review of old charts   (including critical care time)  Medications Ordered in ED Medications  0.9 %  sodium chloride infusion (has no administration in time range)  0.9 %  sodium chloride infusion (has no administration in time range)     Initial Impression / Assessment and Plan / ED Course  I have reviewed the triage vital signs and the nursing notes.  Pertinent  labs & imaging results that were available during my care of the patient were reviewed by me and considered in my medical decision making (see chart for details).  The patient has had a trend down and his hemoglobin is can be seen below.  He denies any bleeding from his stool  Clinical Course as of Jan 18 1542  Wed Jan 18, 2018  1330 Discessed with nephrology, Dr. Posey Pronto who recommends 2 units of blood, hydrate, admit to hospitalist   [BM]    Clinical Course User Index [BM] Noemi Chapel, MD   Hemoglobin  Date Value Ref Range Status  01/18/2018 7.5 (L) 13.0 - 17.0 g/dL Final  01/16/2018 7.3 (L) 13.2 - 17.1 g/dL Final  12/20/2017 9.0 (L) 13.2 - 17.1 g/dL Final  12/07/2017 8.4 (L) 13.0 - 17.0 g/dL Final   BUN  Date Value Ref Range Status  01/18/2018 76 (H) 6 - 20 mg/dL Final  01/16/2018 71 (H) 7 - 25 mg/dL Final  12/20/2017 29 (H) 7 - 25 mg/dL Final  12/07/2017 15 6 - 20 mg/dL Final  08/26/2016 12 4 - 21 Final  05/27/2016 32 (A) 4 - 21 Final   Creat  Date Value Ref Range Status  01/16/2018 5.86 (H) 0.60 - 1.35 mg/dL Final  12/20/2017 3.59 (H) 0.60 - 1.35 mg/dL Final  06/06/2012 13.03 (HH) 0.50 - 1.35 mg/dL Final    Comment:    Result repeated and verified.   Creatinine, Ser  Date Value Ref Range Status  01/18/2018 5.75 (H) 0.61 - 1.24 mg/dL Final  12/07/2017 3.02 (H) 0.61 - 1.24 mg/dL Final  12/06/2017 3.30 (H) 0.61 - 1.24 mg/dL Final  12/05/2017 3.74 (H) 0.61 - 1.24 mg/dL Final   As can be seen above his creatinine has gradually risen and was 3.0 on discharge, it is close to 6 today.  I am concerned that this patient's symptoms have worsened, his renal function has been more progressive and his hemoglobin is very low.  He likely needs admission to the hospital for recurrent transfusion and nephrology evaluation.  The patient is agreeable, thankfully he is not hypotensive despite his severe anemia.  Baseline seems to be around 9   Paged hospitalist for admission at 1:30  PM, transfusion orders transmitted, the patient has symptomatic anemia with an acute kidney injury and requires transfusion to make sure that he is perfusing his transplant kidney.  Gust with the hospitalist for admission.  Prescient Dr. Lorin Mercy and her willingness to participate in the care of this patient  Final Clinical Impressions(s) / ED Diagnoses   Final diagnoses:  Symptomatic anemia  Acute renal failure superimposed on stage 5 chronic kidney disease, not on chronic dialysis, unspecified acute renal failure type [facility]  Diarrhea, unspecified type    ED Discharge Orders    None       Noemi Chapel, MD 01/18/18 1543    Noemi Chapel, MD 02/05/18 [phone]

## 2018-01-18 NOTE — ED Triage Notes (Signed)
Patient arrives with [facility] sent to [facility] by PCP for renal failure after blood on Monday. Patient denies any complaints.   117/70 HR 77 CBG 232

## 2018-01-18 NOTE — ED Triage Notes (Addendum)
Pt arrived via EMS for c.o. Low hemoglobin and renal failure, PCP called and told him to come here to ED for admission. Pt reports weakness, shortness of breath, fatigue, chest tightness last week. No chest pain or shortness of breath at current but does endorse fatigue.  Hx of CHF, denies swelling or weight gain. Does endorse weight loss.

## 2018-01-19 DIAGNOSIS — D649 Anemia, unspecified: Secondary | ICD-10-CM

## 2018-01-19 DIAGNOSIS — N170 Acute kidney failure with tubular necrosis: Secondary | ICD-10-CM | POA: Diagnosis not present

## 2018-01-19 DIAGNOSIS — N179 Acute kidney failure, unspecified: Secondary | ICD-10-CM | POA: Diagnosis not present

## 2018-01-19 DIAGNOSIS — N189 Chronic kidney disease, unspecified: Secondary | ICD-10-CM | POA: Diagnosis not present

## 2018-01-19 DIAGNOSIS — Z940 Kidney transplant status: Secondary | ICD-10-CM | POA: Diagnosis not present

## 2018-01-19 DIAGNOSIS — Z794 Long term (current) use of insulin: Secondary | ICD-10-CM

## 2018-01-19 DIAGNOSIS — D631 Anemia in chronic kidney disease: Secondary | ICD-10-CM

## 2018-01-19 DIAGNOSIS — E1122 Type 2 diabetes mellitus with diabetic chronic kidney disease: Secondary | ICD-10-CM

## 2018-01-19 DIAGNOSIS — I10 Essential (primary) hypertension: Secondary | ICD-10-CM | POA: Diagnosis not present

## 2018-01-19 DIAGNOSIS — N185 Chronic kidney disease, stage 5: Secondary | ICD-10-CM

## 2018-01-19 DIAGNOSIS — E872 Acidosis: Secondary | ICD-10-CM

## 2018-01-19 DIAGNOSIS — R197 Diarrhea, unspecified: Secondary | ICD-10-CM

## 2018-01-19 LAB — BASIC METABOLIC PANEL
Anion gap: 8 (ref 5–15)
BUN: 61 mg/dL — AB (ref 6–20)
CO2: 14 mmol/L — ABNORMAL LOW (ref 22–32)
Calcium: 9.9 mg/dL (ref 8.9–10.3)
Chloride: 115 mmol/L — ABNORMAL HIGH (ref 101–111)
Creatinine, Ser: 4.66 mg/dL — ABNORMAL HIGH (ref 0.61–1.24)
GFR calc Af Amer: 16 mL/min — ABNORMAL LOW (ref >60)
GFR calc non Af Amer: 13 mL/min — ABNORMAL LOW (ref >60)
GLUCOSE: 140 mg/dL — AB (ref 65–99)
POTASSIUM: 3.5 mmol/L (ref 3.5–5.1)
Sodium: 137 mmol/L (ref 135–145)

## 2018-01-19 LAB — GASTROINTESTINAL PANEL BY PCR, STOOL (REPLACES STOOL CULTURE)

## 2018-01-19 LAB — GLUCOSE, CAPILLARY
GLUCOSE-CAPILLARY: 112 mg/dL — AB (ref 65–99)
GLUCOSE-CAPILLARY: 143 mg/dL — AB (ref 65–99)
Glucose-Capillary: 113 mg/dL — ABNORMAL HIGH (ref 65–99)
Glucose-Capillary: 159 mg/dL — ABNORMAL HIGH (ref 65–99)

## 2018-01-19 LAB — TRANSFERRIN: TRANSFERRIN: 164 mg/dL — AB (ref 180–329)

## 2018-01-19 LAB — IRON AND TIBC
Iron: 120 ug/dL (ref 45–182)
Saturation Ratios: 52 % — ABNORMAL HIGH (ref 17.9–39.5)
TIBC: 230 ug/dL — ABNORMAL LOW (ref 250–450)
UIBC: 110 ug/dL

## 2018-01-19 LAB — FERRITIN: FERRITIN: 1155 ng/mL — AB (ref 24–336)

## 2018-01-19 MED ORDER — CARVEDILOL 6.25 MG PO TABS
6.2500 mg | ORAL_TABLET | Freq: Two times a day (BID) | ORAL | Status: DC
Start: 2018-01-19 — End: 2018-01-21
  Administered 2018-01-19 – 2018-01-21 (×4): 6.25 mg via ORAL
  Filled 2018-01-19 (×4): qty 1

## 2018-01-19 MED ORDER — SODIUM CHLORIDE 0.45 % IV SOLN
INTRAVENOUS | Status: DC
  Administered 2018-01-19 – 2018-01-20 (×3): via INTRAVENOUS

## 2018-01-19 MED ORDER — INSULIN ASPART 100 UNIT/ML ~~LOC~~ SOLN: 0.0000 [IU] | Freq: Three times a day (TID) | SUBCUTANEOUS | Status: DC

## 2018-01-19 NOTE — Progress Notes (Signed)
PROGRESS NOTE    [name]  ZOX:[id number] DOB: 01-Jan-1971 DOA: 01/18/2018 PCP: [name], NP    Brief Narrative:  47 year old male who presented with dyspnea on exertion.  He does have a significant past medical history for nonischemic cardiomyopathy, end-stage renal disease status post renal transplant, hypertension, type 2 diabetes mellitus and the recent intestinal infection due to E. coli.  He was seen by his primary care physician who noted him to be anemic and worsening renal failure, he was referred to the hospital for further evaluation.  Initial physical examination blood pressure 121/73, heart rate 69, respiratory 14, temperature 98.5, oxygen saturation 81%.  Lungs were clear to auscultation bilaterally, heart S1-S2 present rhythmic, the abdomen was soft nontender, no lower extremity edema.  Sodium 134, potassium 4.4, chloride 114, bicarb 12, glucose 239, BUN 76, creatinine 5.7, white count 3.9, hemoglobin 7.5, hematocrit 23.9, platelets 103.  Chest x-ray negative for infiltrates.  EKG sinus rhythm, right axis deviation, positive artifact.  Patient was admitted to the hospital with working diagnosis acute kidney injury chronic kidney disease, complicated by symptomatic anemia.  Assessment & Plan:   Principal Problem:   Acute renal failure superimposed on stage 5 chronic kidney disease, not on chronic dialysis (HCC) Active Problems:   DM2 (diabetes mellitus, type 2) (HCC)   Anemia in chronic kidney disease   Essential hypertension, benign   Deceased-donor kidney transplant   Diarrhea   Metabolic acidosis, NAG, failure of bicarbonate regeneration  1.  Acute kidney injury chronic kidney disease. Stable serum cr at 5,7 from 5,8. With K at 4,4, Cl at 114 and serum bicarbonate at 12, will continue gentle hydration with hypotonic saline to prevent worsening hyperchloremia. BMP today. Will continue to follow urine output and renal panel in am. Will continue calcitriol, calcium  carbonate and sodium bicarbonate.   2. SP renal transplant. Will continue immunosuppressive therapy with tacrolimus and mycophenylate, no clinical signs of rejection. Continue prophylaxis with dapsone.   2.  Symptomatic anemia. Sp one unit prbc transfusion, with improved hb up to 8.7. Will check iron panel, patient has been on Epo in the past. Will check outpatient records. Hold on further prbc transfusion for now, patient with no active bleeding.   3.  Hypertension. Will continue blood pressure control with carvedilol.   4. T2DM. Will continue glucose cover and monitoring with insulin sliding scale, basal insulin with 5 units of insulin glargine daily.   DVT prophylaxis: scd  Code Status:  full Family Communication: I spoke with patient's wife at the bedside and all questions were addressed.  Disposition Plan:  Home in 24 to 48 hours, pending renal function.    Consultants:     Procedures:     Antimicrobials:       Subjective: Patient is feeling better, persistent weakness, no nausea or vomiting, diarrhea clinically has been improving, no hematochezia.   Objective: Vitals:   01/18/18 1925 01/18/18 1951 01/19/18 0453 01/19/18 0900  BP:  122/76 121/81 123/76  Pulse:  73 76 73  Resp:  20 14   Temp:  98.6 F (37 C) 98.4 F (36.9 C) 98.4 F (36.9 C)  TempSrc:  Oral Oral   SpO2:  100% 100% 100%  Weight: 50.7 kg (111 lb 12.4 oz)     Height: 5\' 5"  (1.651 m)       Intake/Output Summary (Last 24 hours) at 01/19/2018 0948 Last data filed at 01/19/2018 0600 Gross per 24 hour  Intake 1418.75 ml  Output -  Net 1418.75 ml   Filed Weights   01/18/18 1124 01/18/18 1925  Weight: 53.1 kg (117 lb) 50.7 kg (111 lb 12.4 oz)    Examination:   General: Not in pain or dyspnea, deconditioned  Neurology: Awake and alert, non focal  E ENT: mild pallor, no icterus, oral mucosa moist Cardiovascular: No JVD. S1-S2 present, rhythmic, no gallops, rubs, or murmurs. No lower extremity  edema. Pulmonary: vesicular breath sounds bilaterally, adequate air movement, no wheezing, rhonchi or rales. Gastrointestinal. Abdomen with no organomegaly, non tender, no rebound or guarding Skin. No rashes Musculoskeletal: no joint deformities     Data Reviewed: I have personally reviewed following labs and imaging studies  CBC: Recent Labs  Lab 01/16/18 1220 01/18/18 1134 01/18/18 2020  WBC 3.9 3.9* 3.5*  NEUTROABS 2,246  --   --   HGB 7.3* 7.5* 8.7*  HCT 22.8* 23.9* 27.2*  MCV 87.4 88.8 88.9  PLT 104* 103* 81*   Basic Metabolic Panel: Recent Labs  Lab 01/16/18 1220 01/18/18 1134  NA 131* 134*  K 4.7 4.4  CL 108 114*  CO2 13* 12*  GLUCOSE 229* 239*  BUN 71* 76*  CREATININE 5.86* 5.75*  CALCIUM 11.0* 10.9*   GFR: Estimated Creatinine Clearance: 11.1 mL/min (A) (by C-G formula based on SCr of 5.75 mg/dL (H)). Liver Function Tests: Recent Labs  Lab 01/16/18 1220 01/18/18 1134  AST 7* 11*  ALT 3* 7*  ALKPHOS  --  54  BILITOT 0.3 0.4  PROT 6.7 6.7  ALBUMIN  --  4.4   No results for input(s): LIPASE, AMYLASE in the last 168 hours. No results for input(s): AMMONIA in the last 168 hours. Coagulation Profile: No results for input(s): INR, PROTIME in the last 168 hours. Cardiac Enzymes: Recent Labs  Lab 01/18/18 1134  TROPONINI <0.03   BNP (last 3 results) No results for input(s): PROBNP in the last 8760 hours. HbA1C: No results for input(s): HGBA1C in the last 72 hours. CBG: Recent Labs  Lab 01/18/18 1911 01/18/18 2120 01/19/18 0805  GLUCAP 121* 129* 113*   Lipid Profile: No results for input(s): CHOL, HDL, LDLCALC, TRIG, CHOLHDL, LDLDIRECT in the last 72 hours. Thyroid Function Tests: No results for input(s): TSH, T4TOTAL, FREET4, T3FREE, THYROIDAB in the last 72 hours. Anemia Panel: No results for input(s): VITAMINB12, FOLATE, FERRITIN, TIBC, IRON, RETICCTPCT in the last 72 hours.    Radiology Studies: I have reviewed all of the imaging  during this hospital visit personally     Scheduled Meds: . aspirin EC  81 mg Oral Daily  . atorvastatin  10 mg Oral q1800  . calcitRIOL  0.5 mcg Oral Daily  . calcium carbonate  1 tablet Oral TID WC  . dapsone  50 mg Oral Daily  . folic acid  1 mg Oral Daily  . insulin glargine  5 Units Subcutaneous QHS  . mycophenolate  720 mg Oral BID  . pantoprazole  40 mg Oral QHS  . sodium bicarbonate  1,300 mg Oral TID  . tacrolimus  6 mg Oral q morning - 10a   And  . tacrolimus  5 mg Oral QHS   Continuous Infusions: . sodium chloride    . lactated ringers 125 mL/hr at 01/19/18 0352     LOS: 1 day        Tawni Millers, MD [facility] Pager [phone]

## 2018-01-20 DIAGNOSIS — N189 Chronic kidney disease, unspecified: Secondary | ICD-10-CM | POA: Diagnosis not present

## 2018-01-20 DIAGNOSIS — E1122 Type 2 diabetes mellitus with diabetic chronic kidney disease: Secondary | ICD-10-CM | POA: Diagnosis not present

## 2018-01-20 DIAGNOSIS — R197 Diarrhea, unspecified: Secondary | ICD-10-CM | POA: Diagnosis not present

## 2018-01-20 DIAGNOSIS — N179 Acute kidney failure, unspecified: Secondary | ICD-10-CM | POA: Diagnosis not present

## 2018-01-20 DIAGNOSIS — N170 Acute kidney failure with tubular necrosis: Secondary | ICD-10-CM | POA: Diagnosis not present

## 2018-01-20 DIAGNOSIS — D649 Anemia, unspecified: Secondary | ICD-10-CM | POA: Diagnosis not present

## 2018-01-20 LAB — GLUCOSE, CAPILLARY
GLUCOSE-CAPILLARY: 98 mg/dL (ref 65–99)
GLUCOSE-CAPILLARY: 123 mg/dL — AB (ref 65–99)
Glucose-Capillary: 129 mg/dL — ABNORMAL HIGH (ref 65–99)
Glucose-Capillary: 156 mg/dL — ABNORMAL HIGH (ref 65–99)

## 2018-01-20 NOTE — Progress Notes (Signed)
PT Cancellation Note  Patient Details Name: Aswad Wandrey MRN: [id number] DOB: 1971/06/12   Cancelled Treatment:    Reason Eval/Treat Not Completed: Patient declined, no reason specified Pt reporting he did not want to work with PT today, but would work with PT tomorrow. Despite encouragement to participate, pt continued to refuse, no reason specified. Notified RN. Will follow up as schedule allows.   Leighton Ruff, PT, DPT  Acute Rehabilitation Services  Pager: [phone]    Rudean Hitt 01/20/2018, 4:09 PM

## 2018-01-20 NOTE — Progress Notes (Signed)
PROGRESS NOTE    [name]  ZHY:[id number] DOB: 06-19-1971 DOA: 01/18/2018 PCP: [name], NP    Brief Narrative:  47 year old male who presented with dyspnea on exertion.  He does have a significant past medical history for nonischemic cardiomyopathy, end-stage renal disease status post renal transplant, hypertension, type 2 diabetes mellitus and the recent intestinal infection due to E. coli.  He was seen by his primary care physician who noted him to be anemic and worsening renal failure, he was referred to the hospital for further evaluation.  Initial physical examination blood pressure 121/73, heart rate 69, respiratory 14, temperature 98.5, oxygen saturation 81%.  Lungs were clear to auscultation bilaterally, heart S1-S2 present rhythmic, the abdomen was soft nontender, no lower extremity edema.  Sodium 134, potassium 4.4, chloride 114, bicarb 12, glucose 239, BUN 76, creatinine 5.7, white count 3.9, hemoglobin 7.5, hematocrit 23.9, platelets 103.  Chest x-ray negative for infiltrates.  EKG sinus rhythm, right axis deviation, positive artifact.  Patient was admitted to the hospital with working diagnosis acute kidney injury chronic kidney disease, complicated by symptomatic anemia.   Assessment & Plan:   Principal Problem:   Acute renal failure superimposed on stage 5 chronic kidney disease, not on chronic dialysis (HCC) Active Problems:   DM2 (diabetes mellitus, type 2) (HCC)   Anemia in chronic kidney disease   Essential hypertension, benign   Deceased-donor kidney transplant   Diarrhea   Metabolic acidosis, NAG, failure of bicarbonate regeneration   1.  Acute kidney injury chronic kidney disease. On calcitriol, calcium carbonate and sodium bicarbonate. Renal function continue to improve per serum cr, today down to 4,66 with K at 3,5 and serum bicarbonate at 14. Noted hyperchloremia at 114 along with non gap metabolic acidosis, will change IV fluids to half normal  saline at 50 ml, patient is tolerating po well and diarrhea seems to be improving.   2. SP renal transplant. On immunosuppressive therapy with tacrolimus and mycophenylate. Prophylaxis with dapsone.   2.  Symptomatic anemia, anemia of chronic renal disease. Sp one unit prbc transfusion. Iron studies with ferritin at 1,115 with tibc 230 and iron at 120, transferrin saturation 52. Continue with outpatient Aranesp.   3.  Hypertension. Carvedilol 6,25 mg po bid for blood pressure control.   4. T2DM. Glucose cover and monitoring with insulin sliding scale, basal insulin with 5 units of insulin glargine daily. Capilary glucose 159, 112, 143, 98, 123. Patient tolerating po well.   5. Thrombocytopenia. Worsening thrombocytopenia down to 81, suspected to be reactive, will follow on cell count in am.   DVT prophylaxis: scd  Code Status:  full Family Communication: I spoke with patient's wife at the bedside and all questions were addressed.  Disposition Plan:  Home in 24.    Consultants:     Procedures:     Antimicrobials:      Subjective: Patient is feeling better, improved appetite, no nausea and vomiting, diarrhea continue to improve, no chest pain or dyspnea. Tolerating po well.   Objective: Vitals:   01/19/18 1643 01/19/18 2110 01/20/18 0452 01/20/18 0822  BP: 134/73 116/78 122/74 127/83  Pulse: 70 (!) 143 72 78  Resp: 18 16 16 18   Temp: 98.4 F (36.9 C) 98.2 F (36.8 C) 98.6 F (37 C) 98.6 F (37 C)  TempSrc: Oral Oral Oral Oral  SpO2: 98% 100% 98% 96%  Weight:      Height:        Intake/Output Summary (Last 24 hours)  at 01/20/2018 1106 Last data filed at 01/20/2018 1058 Gross per 24 hour  Intake 1115 ml  Output 0 ml  Net 1115 ml   Filed Weights   01/18/18 1124 01/18/18 1925  Weight: 53.1 kg (117 lb) 50.7 kg (111 lb 12.4 oz)    Examination:   General: Not in pain or dyspnea, deconditioned  Neurology: Awake and alert, non focal  E ENT: mild pallor,  no icterus, oral mucosa moist Cardiovascular: No JVD. S1-S2 present, rhythmic, no gallops, rubs, or murmurs. No lower extremity edema. Pulmonary: vesicular breath sounds bilaterally, adequate air movement, no wheezing, rhonchi or rales. Gastrointestinal. Abdomen with no organomegaly, non tender, no rebound or guarding Skin. No rashes Musculoskeletal: no joint deformities     Data Reviewed: I have personally reviewed following labs and imaging studies  CBC: Recent Labs  Lab 01/16/18 1220 01/18/18 1134 01/18/18 2020  WBC 3.9 3.9* 3.5*  NEUTROABS 2,246  --   --   HGB 7.3* 7.5* 8.7*  HCT 22.8* 23.9* 27.2*  MCV 87.4 88.8 88.9  PLT 104* 103* 81*   Basic Metabolic Panel: Recent Labs  Lab 01/16/18 1220 01/18/18 1134 01/19/18 1928  NA 131* 134* 137  K 4.7 4.4 3.5  CL 108 114* 115*  CO2 13* 12* 14*  GLUCOSE 229* 239* 140*  BUN 71* 76* 61*  CREATININE 5.86* 5.75* 4.66*  CALCIUM 11.0* 10.9* 9.9   GFR: Estimated Creatinine Clearance: 13.8 mL/min (A) (by C-G formula based on SCr of 4.66 mg/dL (H)). Liver Function Tests: Recent Labs  Lab 01/16/18 1220 01/18/18 1134  AST 7* 11*  ALT 3* 7*  ALKPHOS  --  54  BILITOT 0.3 0.4  PROT 6.7 6.7  ALBUMIN  --  4.4   No results for input(s): LIPASE, AMYLASE in the last 168 hours. No results for input(s): AMMONIA in the last 168 hours. Coagulation Profile: No results for input(s): INR, PROTIME in the last 168 hours. Cardiac Enzymes: Recent Labs  Lab 01/18/18 1134  TROPONINI <0.03   BNP (last 3 results) No results for input(s): PROBNP in the last 8760 hours. HbA1C: No results for input(s): HGBA1C in the last 72 hours. CBG: Recent Labs  Lab 01/19/18 0805 01/19/18 1230 01/19/18 1639 01/19/18 2109 01/20/18 0820  GLUCAP 113* 159* 112* 143* 98   Lipid Profile: No results for input(s): CHOL, HDL, LDLCALC, TRIG, CHOLHDL, LDLDIRECT in the last 72 hours. Thyroid Function Tests: No results for input(s): TSH, T4TOTAL, FREET4,  T3FREE, THYROIDAB in the last 72 hours. Anemia Panel: Recent Labs    01/19/18 1928  FERRITIN 1,155*  TIBC 230*  IRON 120      Radiology Studies: I have reviewed all of the imaging during this hospital visit personally     Scheduled Meds: . aspirin EC  81 mg Oral Daily  . atorvastatin  10 mg Oral q1800  . calcitRIOL  0.5 mcg Oral Daily  . calcium carbonate  1 tablet Oral TID WC  . carvedilol  6.25 mg Oral BID WC  . dapsone  50 mg Oral Daily  . folic acid  1 mg Oral Daily  . insulin aspart  0-9 Units Subcutaneous TID WC  . insulin glargine  5 Units Subcutaneous QHS  . mycophenolate  720 mg Oral BID  . pantoprazole  40 mg Oral QHS  . sodium bicarbonate  1,300 mg Oral TID  . tacrolimus  6 mg Oral q morning - 10a   And  . tacrolimus  5 mg Oral QHS  Continuous Infusions: . sodium chloride 75 mL/hr at 01/20/18 0418  . sodium chloride       LOS: 2 days        Tawni Millers, MD [facility] Pager [phone]

## 2018-01-21 DIAGNOSIS — N179 Acute kidney failure, unspecified: Principal | ICD-10-CM

## 2018-01-21 DIAGNOSIS — R197 Diarrhea, unspecified: Secondary | ICD-10-CM | POA: Diagnosis not present

## 2018-01-21 DIAGNOSIS — D649 Anemia, unspecified: Secondary | ICD-10-CM | POA: Diagnosis not present

## 2018-01-21 DIAGNOSIS — N170 Acute kidney failure with tubular necrosis: Secondary | ICD-10-CM | POA: Diagnosis not present

## 2018-01-21 LAB — CBC WITH DIFFERENTIAL/PLATELET
Abs Immature Granulocytes: 0 10*3/uL (ref 0.0–0.1)
Basophils Absolute: 0 10*3/uL (ref 0.0–0.1)
Basophils Relative: 1 %
EOS ABS: 0.4 10*3/uL (ref 0.0–0.7)
Eosinophils Relative: 11 %
HEMATOCRIT: 24.5 % — AB (ref 39.0–52.0)
Hemoglobin: 7.7 g/dL — ABNORMAL LOW (ref 13.0–17.0)
IMMATURE GRANULOCYTES: 1 %
LYMPHS ABS: 1 10*3/uL (ref 0.7–4.0)
Lymphocytes Relative: 33 %
MCH: 28 pg (ref 26.0–34.0)
MCHC: 31.4 g/dL (ref 30.0–36.0)
MCV: 89.1 fL (ref 78.0–100.0)
MONOS PCT: 18 %
Monocytes Absolute: 0.6 10*3/uL (ref 0.1–1.0)
NEUTROS ABS: 1.1 10*3/uL — AB (ref 1.7–7.7)
NEUTROS PCT: 36 %
PLATELETS: 65 10*3/uL — AB (ref 150–400)
RBC: 2.75 MIL/uL — ABNORMAL LOW (ref 4.22–5.81)
RDW: 13.6 % (ref 11.5–15.5)
WBC: 3.1 10*3/uL — ABNORMAL LOW (ref 4.0–10.5)

## 2018-01-21 LAB — BASIC METABOLIC PANEL
ANION GAP: 7 (ref 5–15)
BUN: 45 mg/dL — ABNORMAL HIGH (ref 6–20)
CHLORIDE: 116 mmol/L — AB (ref 101–111)
CO2: 14 mmol/L — AB (ref 22–32)
Calcium: 10.2 mg/dL (ref 8.9–10.3)
Creatinine, Ser: 3.96 mg/dL — ABNORMAL HIGH (ref 0.61–1.24)
GFR calc non Af Amer: 16 mL/min — ABNORMAL LOW (ref >60)
GFR, EST AFRICAN AMERICAN: 19 mL/min — AB (ref >60)
Glucose, Bld: 119 mg/dL — ABNORMAL HIGH (ref 65–99)
POTASSIUM: 3.4 mmol/L — AB (ref 3.5–5.1)
Sodium: 137 mmol/L (ref 135–145)

## 2018-01-21 LAB — GLUCOSE, CAPILLARY
Glucose-Capillary: 109 mg/dL — ABNORMAL HIGH (ref 65–99)
Glucose-Capillary: 116 mg/dL — ABNORMAL HIGH (ref 65–99)

## 2018-01-21 MED ORDER — DARBEPOETIN ALFA 40 MCG/0.4ML IJ SOSY
40.0000 ug | PREFILLED_SYRINGE | Freq: Once | INTRAMUSCULAR | Status: AC
  Administered 2018-01-21: 40 ug via SUBCUTANEOUS
  Filled 2018-01-21: qty 0.4

## 2018-01-21 MED ORDER — CARVEDILOL 12.5 MG PO TABS: 12.5000 mg | ORAL_TABLET | Freq: Two times a day (BID) | ORAL | Status: DC

## 2018-01-21 MED ORDER — DARBEPOETIN ALFA 40 MCG/0.4ML IJ SOSY
40.0000 ug | PREFILLED_SYRINGE | Freq: Once | INTRAMUSCULAR | Status: DC
  Filled 2018-01-21: qty 0.4

## 2018-01-21 MED ORDER — NEPRO/CARBSTEADY PO LIQD: 237.0000 mL | Freq: Three times a day (TID) | ORAL | 0 refills | Status: AC | PRN

## 2018-01-21 NOTE — Evaluation (Signed)
Physical Therapy Evaluation Patient Details Name: [name] MRN: [id number] DOB: [birth year] Today's Date: 01/21/2018   History of Present Illness  Pt. is a 47 y.o. M with significant PMH of NICM, ESRD on HD now s/p renal transplant, HTN, lumbar disc surgery 10/2017, admission 4/19 for diarrhea secondary to E Coli, now presenting with acute on chronic renal insufficiency and symptomatic anemia.   Clinical Impression  Patient evaluated by Physical Therapy with no further acute PT needs identified. All education has been completed and the patient has no further questions. Patient wife available 24/7. Patient presenting close to baseline, ambulating > 400 feet with quad cane modified independently. Slow gait speed and gait abnormalities noted due to LLE weakness but no apparent unsteadiness. See below for any follow-up Physical Therapy. PT is signing off. Thank you for this referral.     Follow Up Recommendations Home health PT    Equipment Recommendations  None recommended by PT    Recommendations for Other Services       Precautions / Restrictions Precautions Precautions: Fall Restrictions Weight Bearing Restrictions: No      Mobility  Bed Mobility Overal bed mobility: Modified Independent             General bed mobility comments: good log roll technique  Transfers Overall transfer level: Modified independent Equipment used: Quad cane                Ambulation/Gait Ambulation/Gait assistance: Modified independent (Device/Increase time) Ambulation Distance (Feet): 420 Feet Assistive device: Quad cane;IV Pole Gait Pattern/deviations: Step-through pattern;Decreased weight shift to left Gait velocity: decreased   General Gait Details: patient with steppage gait due to left dorsiflexion weakness. no unsteadiness noted.   Stairs            Wheelchair Mobility    Modified Rankin (Stroke Patients Only)       Balance Overall balance assessment: No  apparent balance deficits (not formally assessed)(Patient able to adjust socks in standing)                                           Pertinent Vitals/Pain Pain Assessment: No/denies pain    Home Living Family/patient expects to be discharged to:: Private residence Living Arrangements: Spouse/significant other;Children Available Help at Discharge: Family;Available 24 hours/day Type of Home: Apartment Home Access: Level entry     Home Layout: One level Home Equipment: Grab bars - tub/shower;Grab bars - toilet;Bedside commode;Cane - quad Additional Comments: wife is a retired [employer] manager and acts as caretaker for patient at home; they have a handicapped apartment with full accessibility     Prior Function Level of Independence: Independent with assistive device(s)         Comments: uses quad cane for ambulation     Hand Dominance        Extremity/Trunk Assessment   Upper Extremity Assessment Upper Extremity Assessment: Overall WFL for tasks assessed    Lower Extremity Assessment Lower Extremity Assessment: LLE deficits/detail LLE Deficits / Details: Overall WFL except L dorsiflexion 2/5. Tight gastrocenmius.    Cervical / Trunk Assessment Cervical / Trunk Assessment: Other exceptions Cervical / Trunk Exceptions: s/p lumbar disc surgery 3/19  Communication   Communication: No difficulties  Cognition Arousal/Alertness: Awake/alert Behavior During Therapy: WFL for tasks assessed/performed Overall Cognitive Status: Within Functional Limits for tasks assessed  General Comments  RUE shakiness noted during activity    Exercises     Assessment/Plan    PT Assessment Patent does not need any further PT services;All further PT needs can be met in the next venue of care  PT Problem List Decreased strength;Decreased activity tolerance       PT Treatment Interventions      PT Goals (Current goals can  be found in the Care Plan section)  Acute Rehab PT Goals Patient Stated Goal: go home PT Goal Formulation: With patient Time For Goal Achievement: 01/26/18 Potential to Achieve Goals: Good    Frequency     Barriers to discharge        Co-evaluation               AM-PAC PT "6 Clicks" Daily Activity  Outcome Measure Difficulty turning over in bed (including adjusting bedclothes, sheets and blankets)?: None Difficulty moving from lying on back to sitting on the side of the bed? : None Difficulty sitting down on and standing up from a chair with arms (e.g., wheelchair, bedside commode, etc,.)?: None Help needed moving to and from a bed to chair (including a wheelchair)?: None Help needed walking in hospital room?: None Help needed climbing 3-5 steps with a railing? : A Little 6 Click Score: 23    End of Session Equipment Utilized During Treatment: Gait belt Activity Tolerance: Patient tolerated treatment well Patient left: in chair;with call bell/phone within reach;with family/visitor present Nurse Communication: Mobility status PT Visit Diagnosis: Difficulty in walking, not elsewhere classified (R26.2);Muscle weakness (generalized) (M62.81)    Time: 9980-0123 PT Time Calculation (min) (ACUTE ONLY): 25 min   Charges:   PT Evaluation $PT Eval Moderate Complexity: 1 Mod PT Treatments $Therapeutic Activity: 8-22 mins   PT G Codes:       Ellamae Sia, PT, DPT Acute Rehabilitation Services  Pager: [phone]   Willy Eddy 01/21/2018, 10:39 AM

## 2018-01-21 NOTE — Care Management Note (Signed)
Case Management Note  Patient Details  Name: [name] MRN: [id number] Date of Birth: March 14, 1971  Subjective/Objective:   Pt admitted with SOB                   Action/Plan:  PTA independent from home.  Pt declined HHPT as recommended.  Pt has PCP and denied barriers to obtaining/paying for medications  Expected Discharge Date:  01/21/18               Expected Discharge Plan:  Home/Self Care(Independent from home, Pt has PCP and denied barriers with obtaining/paying for medications)  In-House Referral:     Discharge planning Services  CM Consult  Post Acute Care Choice:    Choice offered to:     DME Arranged:    DME Agency:     HH Arranged:    [place] Agency:     Status of Service:  Completed, signed off  If discussed at [employer], dates discussed:    Additional Comments:  Maryclare Labrador, RN 01/21/2018, 3:20 PM

## 2018-01-21 NOTE — Discharge Summary (Signed)
Physician Discharge Summary  [name] PQZ:[id number] DOB: [birth year] DOA: 01/18/2018  PCP: [name], NP  Admit date: 01/18/2018 Discharge date: 01/21/2018  Admitted From: Home  Disposition:  Home   Recommendations for Outpatient Follow-up and new medication changes:  1. Follow up with Sherrie Mustache in one week 2. Patient had one unit prbc transfusion 3. Received Aranesp before discharge   Home Health: Yes  Equipment/Devices: no   Discharge Condition: stable  CODE STATUS: full  Diet recommendation: Heart healthy, diabetic and renal prudent.   Brief/Interim Summary: 47 year old male who presented with dyspnea on exertion. He does have a significant past medical history for nonischemic cardiomyopathy, end-stage renal disease status post renal transplant, hypertension, type 2 diabetes mellitus and the recent intestinal infection due to E. coli. He was seen by his primary care physician who noted him to be anemic and worsening renal failure, he was referred to the hospital for further evaluation. Initial physical examination blood pressure 121/73, heart rate 69, respiratory rate 14, temperature 98.5, oxygen saturation 81%.Lungs wereclear to auscultation bilaterally, heart S1-S2 present rhythmic, the abdomen was soft nontender, no lower extremity edema. Sodium 134, potassium 4.4, chloride 114, bicarb 12, glucose 239, BUN 76, creatinine 5.7, white count 3.9, hemoglobin 7.5, hematocrit 23.9, platelets 103.Chest x-ray negative for infiltrates. EKG sinus rhythm, right axis deviation, positive artifact.  Patient was admitted to the hospital with working diagnosis acute kidney injury chronic kidney disease stage IV,complicated by symptomatic anemia.  1.  Acute prerenal kidney injury on chronic kidney disease stage IV, status post renal transplant.  Patient was admitted to the medical unit, he was placed on a remote telemetry monitor.  He received IV fluids with improvement of  kidney function, his discharge creatinine is 3.96, his baseline likely is 4-4.5.  His serum potassium is 3.4 with a serum bicarbonate of 14.  Patient will continue calcitriol, calcium carbonate, sodium bicarbonate.  Continue immunosuppressive therapy with tacrolimus, and mycophenolate.  Antibiotic prophylaxis with dapsone.   2.  Symptomatic anemia due to anemia chronic renal disease.  Patient received 1 unit of packed red blood cells, his discharge hemoglobin is 7.7, iron studies showed a ferritin of 1,115, TIBC of 230 and serum iron of 120 with a transferrin saturation of 52%.  Patient will receive Aranesp before discharge, recommendations to continue as an outpatient.  Target hemoglobin of 11.   3.  Hypertension.  Continue carvedilol for blood pressure control.  He remained stable during his hospitalization.  4.  Type 2 diabetes mellitus.  Patient was placed on insulin sliding scale during his hospitalization, basal insulin with 5 units of glargine daily.  His glucose remained well controlled.  5.  Multifactorial thrombocytopenia.  Remained stable, discharge platelets 65,000.  Follow-up cell count as an outpatient.   Discharge Diagnoses:  Principal Problem:   Acute renal failure superimposed on stage 5 chronic kidney disease, not on chronic dialysis [facility]) Active Problems:   DM2 (diabetes mellitus, type 2) (HCC)   Anemia in chronic kidney disease   Essential hypertension, benign   Deceased-donor kidney transplant   Diarrhea   Metabolic acidosis, NAG, failure of bicarbonate regeneration    Discharge Instructions   Allergies as of 01/21/2018      Reactions   Lactose Intolerance (gi) Diarrhea   No whole milk!!   Other Other (See Comments)   Soy milk burns his tongue   Pollen Extract Other (See Comments)   Itching, watery eyes and sneezing.   Tape Rash   PLEASE USE  COBAN WRAP OR PAPER TAPE       Medication List    TAKE these medications   accu-chek multiclix lancets Use as  directed   acetaminophen 500 MG tablet Commonly known as:  TYLENOL Take 1,000 mg by mouth every 6 (six) hours as needed for mild pain.   aspirin EC 81 MG tablet Take 81 mg by mouth daily.   atorvastatin 10 MG tablet Commonly known as:  LIPITOR Take one tablet by mouth at bedtime for cholesterol.   calcitRIOL 0.5 MCG capsule Commonly known as:  ROCALTROL Take 1 capsule by mouth daily.   calcium carbonate 1250 (500 Ca) MG tablet Commonly known as:  OS-CAL - dosed in mg of elemental calcium Take 1 tablet by mouth 3 (three) times daily with meals.   carvedilol 6.25 MG tablet Commonly known as:  COREG Take 1 tablet (6.25 mg total) by mouth 2 (two) times daily with a meal.   dapsone 25 MG tablet Take 50 mg by mouth daily.   feeding supplement (NEPRO CARB STEADY) Liqd Take 237 mLs by mouth 3 (three) times daily as needed (Supplement).   folic acid 1 MG tablet Commonly known as:  FOLVITE Take 1 mg by mouth daily.   glucose blood test strip Commonly known as:  ACCU-CHEK AVIVA Check blood sugar 4 times daily and before bed time.   insulin aspart 100 UNIT/ML FlexPen Commonly known as:  NOVOLOG Inject 12 Units into the skin 3 (three) times daily before meals. What changed:    how much to take  when to take this  additional instructions   Insulin Pen Needle 31G X 5 MM Misc 5 Units/oz/day by Does not apply route at bedtime. Use 4 times daily to inject insulin to skin   ketoconazole 2 % shampoo Commonly known as:  NIZORAL Apply topically 2 (two) times a week. Wash hair and beard.  Leave shampoo on skin for 5 mins before washing off.   mycophenolate 180 MG EC tablet Commonly known as:  MYFORTIC Take 720 mg by mouth See admin instructions. 0800, 2000   omeprazole 20 MG capsule Commonly known as:  PRILOSEC Take 1 capsule (20 mg total) by mouth daily. What changed:  when to take this   sodium bicarbonate 650 MG tablet Take 1,300 mg by mouth 3 (three) times daily.    tacrolimus 1 MG capsule Commonly known as:  PROGRAF Take 5-6 mg by mouth See admin instructions. 6 mg in the morning and 5 mg in the evening - 1000, 2200   TRESIBA FLEXTOUCH 100 UNIT/ML Sopn FlexTouch Pen Generic drug:  insulin degludec Inject 5 Units into the skin daily at 10 pm.       Allergies  Allergen Reactions  . Lactose Intolerance (Gi) Diarrhea    No whole milk!!  . Other Other (See Comments)    Soy milk burns his tongue  . Pollen Extract Other (See Comments)    Itching, watery eyes and sneezing.  . Tape Rash    PLEASE USE COBAN WRAP OR PAPER TAPE     Consultations:     Procedures/Studies: Dg Chest 2 View  Result Date: 01/18/2018 CLINICAL DATA:  Shortness of breath, chest pain, dizziness, weakness, diabetes mellitus, renal failure, hypertension EXAM: CHEST - 2 VIEW COMPARISON:  02/08/2017 FINDINGS: Rotated to the LEFT. Normal heart size, mediastinal contours, and pulmonary vascularity. Lungs clear. No pleural effusion or pneumothorax. Bones unremarkable. IMPRESSION: No acute abnormalities. Electronically Signed   By: Crist Infante.D.  On: 01/18/2018 11:58       Subjective: Patient is feeling better, no further diarrhea, no nausea or vomiting, no chest pain r dyspnea.   Discharge Exam: Vitals:   01/21/18 0510 01/21/18 0929  BP: 116/72 127/82  Pulse: 78 83  Resp: 16 16  Temp: 98.6 F (37 C) 98.2 F (36.8 C)  SpO2: 96% 98%   Vitals:   01/20/18 1731 01/20/18 2038 01/21/18 0510 01/21/18 0929  BP: 131/73 117/72 116/72 127/82  Pulse: 70 68 78 83  Resp: 18 16 16 16   Temp: 97.8 F (36.6 C) 98.4 F (36.9 C) 98.6 F (37 C) 98.2 F (36.8 C)  TempSrc: Oral  Oral Oral  SpO2: 97% 97% 96% 98%  Weight:      Height:        General: Not in pain or dyspnea  Neurology: Awake and alert, non focal  E ENT: mild pallor, no icterus, oral mucosa moist Cardiovascular: No JVD. S1-S2 present, rhythmic, no gallops, rubs, or murmurs. No lower extremity  edema. Pulmonary: vesicular breath sounds bilaterally, adequate air movement, no wheezing, rhonchi or rales. Gastrointestinal. Abdomen with no organomegaly, non tender, no rebound or guarding Skin. No rashes Musculoskeletal: no joint deformities   The results of significant diagnostics from this hospitalization (including imaging, microbiology, ancillary and laboratory) are listed below for reference.     Microbiology: Recent Results (from the past 240 hour(s))  Gastrointestinal Panel by PCR , Stool     Status: None   Collection Time: 01/18/18  3:37 PM  Result Value Ref Range Status   Campylobacter species NOT DETECTED NOT DETECTED Final   Plesimonas shigelloides NOT DETECTED NOT DETECTED Final   Salmonella species NOT DETECTED NOT DETECTED Final   Yersinia enterocolitica NOT DETECTED NOT DETECTED Final   Vibrio species NOT DETECTED NOT DETECTED Final   Vibrio cholerae NOT DETECTED NOT DETECTED Final   Enteroaggregative E coli (EAEC) NOT DETECTED NOT DETECTED Final   Enteropathogenic E coli (EPEC) NOT DETECTED NOT DETECTED Final   Enterotoxigenic E coli (ETEC) NOT DETECTED NOT DETECTED Final   Shiga like toxin producing E coli (STEC) NOT DETECTED NOT DETECTED Final   Shigella/Enteroinvasive E coli (EIEC) NOT DETECTED NOT DETECTED Final   Cryptosporidium NOT DETECTED NOT DETECTED Final   Cyclospora cayetanensis NOT DETECTED NOT DETECTED Final   Entamoeba histolytica NOT DETECTED NOT DETECTED Final   Giardia lamblia NOT DETECTED NOT DETECTED Final   Adenovirus F40/41 NOT DETECTED NOT DETECTED Final   Astrovirus NOT DETECTED NOT DETECTED Final   Norovirus GI/GII NOT DETECTED NOT DETECTED Final   Rotavirus A NOT DETECTED NOT DETECTED Final   Sapovirus (I, II, IV, and V) NOT DETECTED NOT DETECTED Final    Comment: Performed at [facility], [address] [place], [place] [address]  C difficile quick scan w PCR reflex     Status: None   Collection Time: 01/18/18  3:37 PM   Result Value Ref Range Status   C Diff antigen NEGATIVE NEGATIVE Final   C Diff toxin NEGATIVE NEGATIVE Final   C Diff interpretation No C. difficile detected.  Final    Comment: Performed at [facility], [place] [address], [place],  [address]     Labs: BNP (last 3 results) No results for input(s): BNP in the last 8760 hours. Basic Metabolic Panel: Recent Labs  Lab 01/16/18 1220 01/18/18 1134 01/19/18 1928 01/21/18 0548  NA 131* 134* 137 137  K 4.7 4.4 3.5 3.4*  CL 108 114* 115* 116*  CO2 13* 12* 14* 14*  GLUCOSE 229* 239* 140* 119*  BUN 71* 76* 61* 45*  CREATININE 5.86* 5.75* 4.66* 3.96*  CALCIUM 11.0* 10.9* 9.9 10.2   Liver Function Tests: Recent Labs  Lab 01/16/18 1220 01/18/18 1134  AST 7* 11*  ALT 3* 7*  ALKPHOS  --  54  BILITOT 0.3 0.4  PROT 6.7 6.7  ALBUMIN  --  4.4   No results for input(s): LIPASE, AMYLASE in the last 168 hours. No results for input(s): AMMONIA in the last 168 hours. CBC: Recent Labs  Lab 01/16/18 1220 01/18/18 1134 01/18/18 2020 01/21/18 0548  WBC 3.9 3.9* 3.5* 3.1*  NEUTROABS 2,246  --   --  1.1*  HGB 7.3* 7.5* 8.7* 7.7*  HCT 22.8* 23.9* 27.2* 24.5*  MCV 87.4 88.8 88.9 89.1  PLT 104* 103* 81* 65*   Cardiac Enzymes: Recent Labs  Lab 01/18/18 1134  TROPONINI <0.03   BNP: Invalid input(s): POCBNP CBG: Recent Labs  Lab 01/20/18 0820 01/20/18 1219 01/20/18 1728 01/20/18 2038 01/21/18 0802  GLUCAP 98 123* 129* 156* 109*   D-Dimer No results for input(s): DDIMER in the last 72 hours. Hgb A1c No results for input(s): HGBA1C in the last 72 hours. Lipid Profile No results for input(s): CHOL, HDL, LDLCALC, TRIG, CHOLHDL, LDLDIRECT in the last 72 hours. Thyroid function studies No results for input(s): TSH, T4TOTAL, T3FREE, THYROIDAB in the last 72 hours.  Invalid input(s): FREET3 Anemia work up Recent Labs    01/19/18 1928  FERRITIN 1,155*  TIBC 230*  IRON 120   Urinalysis    Component Value  Date/Time   COLORURINE YELLOW 12/01/2017 0116   APPEARANCEUR CLEAR 12/01/2017 0116   LABSPEC 1.011 12/01/2017 0116   PHURINE 5.0 12/01/2017 0116   GLUCOSEU >=500 (A) 12/01/2017 0116   HGBUR NEGATIVE 12/01/2017 0116   BILIRUBINUR NEGATIVE 12/01/2017 0116   KETONESUR NEGATIVE 12/01/2017 0116   PROTEINUR NEGATIVE 12/01/2017 0116   UROBILINOGEN 0.2 12/23/2008 1845   NITRITE NEGATIVE 12/01/2017 0116   LEUKOCYTESUR NEGATIVE 12/01/2017 0116   Sepsis Labs Invalid input(s): PROCALCITONIN,  WBC,  LACTICIDVEN Microbiology Recent Results (from the past 240 hour(s))  Gastrointestinal Panel by PCR , Stool     Status: None   Collection Time: 01/18/18  3:37 PM  Result Value Ref Range Status   Campylobacter species NOT DETECTED NOT DETECTED Final   Plesimonas shigelloides NOT DETECTED NOT DETECTED Final   Salmonella species NOT DETECTED NOT DETECTED Final   Yersinia enterocolitica NOT DETECTED NOT DETECTED Final   Vibrio species NOT DETECTED NOT DETECTED Final   Vibrio cholerae NOT DETECTED NOT DETECTED Final   Enteroaggregative E coli (EAEC) NOT DETECTED NOT DETECTED Final   Enteropathogenic E coli (EPEC) NOT DETECTED NOT DETECTED Final   Enterotoxigenic E coli (ETEC) NOT DETECTED NOT DETECTED Final   Shiga like toxin producing E coli (STEC) NOT DETECTED NOT DETECTED Final   Shigella/Enteroinvasive E coli (EIEC) NOT DETECTED NOT DETECTED Final   Cryptosporidium NOT DETECTED NOT DETECTED Final   Cyclospora cayetanensis NOT DETECTED NOT DETECTED Final   Entamoeba histolytica NOT DETECTED NOT DETECTED Final   Giardia lamblia NOT DETECTED NOT DETECTED Final   Adenovirus F40/41 NOT DETECTED NOT DETECTED Final   Astrovirus NOT DETECTED NOT DETECTED Final   Norovirus GI/GII NOT DETECTED NOT DETECTED Final   Rotavirus A NOT DETECTED NOT DETECTED Final   Sapovirus (I, II, IV, and V) NOT DETECTED NOT DETECTED Final  Comment: Performed at [facility], [address] [place], [place]  [address]  C difficile quick scan w PCR reflex     Status: None   Collection Time: 01/18/18  3:37 PM  Result Value Ref Range Status   C Diff antigen NEGATIVE NEGATIVE Final   C Diff toxin NEGATIVE NEGATIVE Final   C Diff interpretation No C. difficile detected.  Final    Comment: Performed at [facility], [place] [address], [place], [place] [address]     Time coordinating discharge: 45 minutes  SIGNED:   Tawni Millers, MD  [facility] 01/21/2018, 11:06 AM Pager [phone]  If 7PM-7AM, please contact night-coverage [email or web address] Password [email or web address]

## 2018-01-21 NOTE — Progress Notes (Signed)
Patient discharged to Home with wife. After visit Summary reviewed. Patient capable of reverbalizing medications and follow up visits. No signs and symptoms of distress noted. Patient educated to return to the ED in the case of an emergency. Dillon Bjork RN

## 2018-01-22 LAB — TYPE AND SCREEN
ABO/RH(D): O POS
Antibody Screen: NEGATIVE
UNIT DIVISION: 0
Unit division: 0

## 2018-01-22 LAB — BPAM RBC
BLOOD PRODUCT EXPIRATION DATE: 201907052359
Blood Product Expiration Date: 201907052359
ISSUE DATE / TIME: 201906051551
UNIT TYPE AND RH: 5100
Unit Type and Rh: 5100

## 2018-01-23 ENCOUNTER — Telehealth: Payer: Self-pay

## 2018-01-23 NOTE — Telephone Encounter (Signed)
I have made the 1st attempt to contact the patient or family member in charge, in order to follow up from recently being discharged from the hospital. I left a message on voicemail but I will make another attempt at a different time.  

## 2018-01-24 ENCOUNTER — Telehealth: Payer: Self-pay

## 2018-01-24 NOTE — Telephone Encounter (Signed)
I have made the 2nd attempt to contact the patient or family member in charge, in order to follow up from recently being discharged from the hospital. I left a message on his voicemail and his wife's voicemail but I will make another attempt at a different time.

## 2018-01-25 ENCOUNTER — Encounter (HOSPITAL_COMMUNITY): Payer: Medicare Other

## 2018-01-25 LAB — OVA + PARASITE EXAM

## 2018-01-25 LAB — O&P RESULT

## 2018-01-27 ENCOUNTER — Ambulatory Visit (INDEPENDENT_AMBULATORY_CARE_PROVIDER_SITE_OTHER): Payer: Medicare Other

## 2018-01-27 ENCOUNTER — Other Ambulatory Visit: Payer: Self-pay

## 2018-01-27 VITALS — BP 130/60 | HR 65 | Temp 98.6°F | Ht 65.0 in | Wt 120.0 lb

## 2018-01-27 DIAGNOSIS — Z0000 Encounter for general adult medical examination without abnormal findings: Secondary | ICD-10-CM | POA: Diagnosis not present

## 2018-01-27 NOTE — Progress Notes (Signed)
Subjective:   [name] is a 47 y.o. male who presents for Medicare Annual/Subsequent preventive examination.  Last AWV-09/17/2016       Objective:    Vitals: BP 130/60 (BP Location: Left Arm, Patient Position: Sitting)   Pulse 65   Temp 98.6 F (37 C) (Oral)   Ht 5\' 5"  (1.651 m)   Wt 120 lb (54.4 kg)   BMI 19.97 kg/m   Body mass index is 19.97 kg/m.  Advanced Directives 01/27/2018 01/18/2018 12/20/2017 02/08/2017 01/29/2015 01/16/2015 01/08/2015  Does Patient Have a Medical Advance Directive? No No No No No No No  Would patient like information on creating a medical advance directive? Yes (MAU/Ambulatory/Procedural Areas - Information given) No - Patient declined Yes (MAU/Ambulatory/Procedural Areas - Information given) No - Patient declined (No Data) Yes - Educational materials given (No Data)  Pre-existing out of facility DNR order (yellow form or pink MOST form) - - - - - - -    Tobacco Social History   Tobacco Use  Smoking Status Never Smoker  Smokeless Tobacco Never Used     Counseling given: Not Answered   Clinical Intake:  Pre-visit preparation completed: No  Pain : No/denies pain     Nutritional Risks: None Diabetes: Yes CBG done?: No Did pt. bring in CBG monitor from home?: No  How often do you need to have someone help you when you read instructions, pamphlets, or other written materials from your doctor or pharmacy?: 1 - Never What is the last grade level you completed in school?: Some college  Interpreter Needed?: No  Information entered by :: Tyson Dense, RN  Past Medical History:  Diagnosis Date  . Anemia   . CKD (chronic kidney disease), stage III ([place])   . Diabetes mellitus 12/2008   A1C 8.3 12/23/08, 24hr urine protein 2793 mg/day, SPEP neg, proliferative BDR s/p photocoagulation 08/2009 done at [facility] and f/u by DR Groat  . ESRD (end stage renal disease) on dialysis ([place])    1.Initiated HD via right per cate 12/30/2008 (MWF schedule at  [facility]) 2.Left upper extremity A-V fistula by Dr Oneida Alar 12/30/2008 3.Aemia: Received Venofer 12/30/08 Ferritin 179 % sat 12 12/23/08, 4. Secondary Hyperparathyroidism- PTH 188, P 5.4, Ca 9.2 12/2008  . Hemodialysis-associated hypotension    coreg d/c'd  . Herniated lumbar disc without myelopathy   . History of blood transfusion   . Hyperparathyroidism due to renal insufficiency ([place])   . Hypertension    resloved after initating HD, now with post HD hypotension  . Hypocalcemia   . Hypomagnesemia   . 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 Non-ischemic cardiomyopathy ([place])    EF 20-25% 12/23/2008>>EF 40-45% 03/2009>>EF ??? 7/?/2011  . Occult blood positive stool   . Psoriasis    scalp  . Thrombocytopenia ([place])   . Vitamin B 12 deficiency    Past Surgical History:  Procedure Laterality Date  . AV FISTULA PLACEMENT     left  . AV FISTULA PLACEMENT  09/12/2012   Procedure: INSERTION OF ARTERIOVENOUS (AV) GORE-TEX GRAFT ARM;  Surgeon: Elam Dutch, MD;  Location: [place];  Service: Vascular;  Laterality: Left;  KIDNEY TRANSPLANT FAILED  . CATARACT EXTRACTION, BILATERAL    . EYE SURGERY    . INSERTION OF DIALYSIS CATHETER  07/14/2012   Procedure: INSERTION OF DIALYSIS CATHETER;  Surgeon: Mal Misty, MD;  Location: [place];  Service: Vascular;  Laterality:  Right;  Ultrasound Guided Insertion of Internal Jugular Dialysis Catheter  . 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 KIDNEY TRANSPLANT  12/2013   second kidney transplant  . lumbar disc sugery   10/17/2017  . LUMBAR DISC SURGERY    . NEPHRECTOMY TRANSPLANTED ORGAN  08/11/12  . PHOTOCOAGULATION     for diabetic retinopathy 08/1999  . SHUNTOGRAM N/A 07/11/2012   Procedure: Earney Mallet;  Surgeon: Serafina Mitchell, MD;  Location: [facility] CATH LAB;  Service: Cardiovascular;  Laterality: N/A;   Family History  Problem Relation Age of Onset  . Diabetes  Father   . Kidney disease Father   . Other Father        amputation  . Hypertension Mother   . Dementia Mother   . Diabetes Sister   . Hypertension Sister   . Diabetes Sister   . Kidney disease Sister   . Diabetes Sister   . Hypertension Brother   . Diabetes Brother   . CAD Neg Hx   . Sudden Cardiac Death Neg Hx   . Congestive Heart Failure Neg Hx   . Colon cancer Neg Hx   . Rectal cancer Neg Hx   . Esophageal cancer Neg Hx   . Liver cancer Neg Hx    Social History   Socioeconomic History  . Marital status: Married    Spouse name: Not on file  . Number of children: 2  . Years of education: Not on file  . Highest education level: Not on file  Occupational History  . Occupation: disabled  Social Needs  . Financial resource strain: Somewhat hard  . Food insecurity:    Worry: Never true    Inability: Never true  . Transportation needs:    Medical: No    Non-medical: No  Tobacco Use  . Smoking status: Never Smoker  . Smokeless tobacco: Never Used  Substance and Sexual Activity  . Alcohol use: No    Alcohol/week: 0.0 oz  . Drug use: No  . Sexual activity: Not on file  Lifestyle  . Physical activity:    Days per week: 4 days    Minutes per session: 30 min  . Stress: Not at all  Relationships  . Social connections:    Talks on phone: More than three times a week    Gets together: More than three times a week    Attends religious service: More than 4 times per year    Active member of club or organization: No    Attends meetings of clubs or organizations: Never    Relationship status: Married  Other Topics Concern  . Not on file  Social History Narrative   Social History      Diet?       Do you drink/eat things with caffeine? yes      Marital status?            married                        What year were you married?       Do you live in a house, apartment, assisted living, condo, trailer, etc.? apartment      Is it one or more stories? one      How  many persons live in your home? 2      Do you have any pets in your home? (please list) no  Highest level of education completed?       Current or past profession: bus owner      Do you exercise?            yes                          Type & how often? Walking daily      Advanced Directives      Do you have a living will? no      Do you have a DNR form?            no                      If not, do you want to discuss one?      Do you have signed POA/HPOA for forms? no      Functional Status      Do you have difficulty bathing or dressing yourself? yes      Do you have difficulty preparing food or eating? no      Do you have difficulty managing your medications? yes      Do you have difficulty managing your finances? no      Do you have difficulty affording your medications? yes    Outpatient Encounter Medications as of 01/27/2018  Medication Sig  . acetaminophen (TYLENOL) 500 MG tablet Take 1,000 mg by mouth every 6 (six) hours as needed for mild pain.  Marland Kitchen aspirin EC 81 MG tablet Take 81 mg by mouth daily.  Marland Kitchen atorvastatin (LIPITOR) 10 MG tablet Take one tablet by mouth at bedtime for cholesterol.  . calcitRIOL (ROCALTROL) 0.5 MCG capsule Take 1 capsule by mouth daily.   . calcium carbonate (OS-CAL - DOSED IN MG OF ELEMENTAL CALCIUM) 1250 MG tablet Take 1 tablet by mouth 3 (three) times daily with meals.  . carvedilol (COREG) 6.25 MG tablet Take 1 tablet (6.25 mg total) by mouth 2 (two) times daily with a meal.  . dapsone 25 MG tablet Take 50 mg by mouth daily.  . folic acid (FOLVITE) 1 MG tablet Take 1 mg by mouth daily.   Marland Kitchen glucose blood (ACCU-CHEK AVIVA) test strip Check blood sugar 4 times daily and before bed time.  . insulin aspart (NOVOLOG) 100 UNIT/ML FlexPen Inject 12 Units into the skin 3 (three) times daily before meals. (Patient taking differently: Inject into the skin See admin instructions. Sliding scale Smart Meter)  . insulin degludec (TRESIBA FLEXTOUCH)  100 UNIT/ML SOPN FlexTouch Pen Inject 5 Units into the skin daily at 10 pm.  . Insulin Pen Needle 31G X 5 MM MISC 5 Units/oz/day by Does not apply route at bedtime. Use 4 times daily to inject insulin to skin  . ketoconazole (NIZORAL) 2 % shampoo Apply topically 2 (two) times a week. Wash hair and beard.  Leave shampoo on skin for 5 mins before washing off.  . Lancets (ACCU-CHEK MULTICLIX) lancets Use as directed  . mycophenolate (MYFORTIC) 180 MG EC tablet Take 720 mg by mouth See admin instructions. 0800, 2000  . Nutritional Supplements (FEEDING SUPPLEMENT, NEPRO CARB STEADY,) LIQD Take 237 mLs by mouth 3 (three) times daily as needed (Supplement).  Marland Kitchen omeprazole (PRILOSEC) 20 MG capsule Take 1 capsule (20 mg total) by mouth daily. (Patient taking differently: Take 20 mg by mouth at bedtime. )  . sodium bicarbonate 650 MG tablet Take 1,300 mg by mouth 3 (three) times daily.   Marland Kitchen  tacrolimus (PROGRAF) 1 MG capsule Take 5-6 mg by mouth See admin instructions. 6 mg in the morning and 5 mg in the evening - 1000, 2200   No facility-administered encounter medications on file as of 01/27/2018.     Activities of Daily Living In your present state of health, do you have any difficulty performing the following activities: 01/27/2018 01/18/2018  Hearing? N Y  Comment - "lately"  Vision? N N  Difficulty concentrating or making decisions? N N  Walking or climbing stairs? Y Y  Dressing or bathing? N N  Doing errands, shopping? N Y  Comment - "not driving right nowChief Executive Officer and eating ? N -  Using the Toilet? N -  In the past six months, have you accidently leaked urine? N -  Do you have problems with loss of bowel control? N -  Managing your Medications? Y -  Managing your Finances? Y -  Housekeeping or managing your Housekeeping? Y -  Some recent data might be hidden    Patient Care Team: [name], NP as PCP - General (Geriatric Medicine) [name], MD as Referring Physician  (Ophthalmology) [name], MD as Consulting Physician (Cardiology) [name], MD as Consulting Physician (Nephrology) [name], MD as Referring Physician (Surgery)   Assessment:   This is a routine wellness examination for [name].  Exercise Activities and Dietary recommendations Current Exercise Habits: Home exercise routine, Type of exercise: walking, Time (Minutes): 30, Frequency (Times/Week): 5, Weekly Exercise (Minutes/Week): 150, Intensity: Mild, Exercise limited by: None identified  Goals    None      Fall Risk Fall Risk  01/27/2018 01/16/2018 12/20/2017 01/29/2015 01/16/2015  Falls in the past year? Yes Yes Yes No No  Number falls in past yr: 2 or more - 2 or more - -  Comment - - 20 or more before back surgery but no falls since - -  Injury with Fall? No - - - -   Is the patient's home free of loose throw rugs in walkways, pet beds, electrical cords, etc?   yes      Grab bars in the bathroom? no      Handrails on the stairs?   yes      Adequate lighting?   yes  Timed Get Up and Go Performed: 25 seconds  Depression Screen PHQ 2/9 Scores 01/27/2018 12/20/2017 01/29/2015 01/16/2015  PHQ - 2 Score 0 0 0 0    Cognitive Function within normal function MMSE - Mini Mental State Exam 01/27/2018  Not completed: (No Data)        Immunization History  Administered Date(s) Administered  . Influenza,inj,Quad PF,6+ Mos 04/24/2014  . Influenza-Unspecified 05/03/2012, 06/11/2016  . Pneumococcal Polysaccharide-23 12/23/2008, 04/24/2014  . Tdap 04/24/2014    Qualifies for Shingles Vaccine? No, will be due when patient is 47 years old  Screening Tests Health Maintenance  Topic Date Due  . OPHTHALMOLOGY EXAM  06/05/2015  . FOOT EXAM  11/11/2015  . URINE MICROALBUMIN  06/11/2017  . INFLUENZA VACCINE  03/16/2018  . HEMOGLOBIN A1C  03/22/2018  . LIPID PANEL  12/21/2018  . TETANUS/TDAP  04/24/2024  . PNEUMOCOCCAL POLYSACCHARIDE VACCINE  Completed  . HIV Screening   Completed   Cancer Screenings: Lung: Low Dose CT Chest recommended if Age 66-80 years, 30 pack-year currently smoking OR have quit w/in 15years. Patient does not qualify. Colorectal: excluded due at age 28  Additional Screenings:  Hepatitis C Screening:declined  Eye exam  scheduled for July 2019  Hearing Screen completed    Plan:    I have personally reviewed and addressed the Medicare Annual Wellness questionnaire and have noted the following in the patient's chart:  A. Medical and social history B. Use of alcohol, tobacco or illicit drugs  C. Current medications and supplements D. Functional ability and status E.  Nutritional status F.  Physical activity G. Advance directives H. List of other physicians I.  Hospitalizations, surgeries, and ER visits in previous 12 months J.  [place] to include hearing, vision, cognitive, depression L. Referrals and appointments - none  In addition, I have reviewed and discussed with patient certain preventive protocols, quality metrics, and best practice recommendations. A written personalized care plan for preventive services as well as general preventive health recommendations were provided to patient.  See attached scanned questionnaire for additional information.   Signed,   Tyson Dense, RN Nurse Health Advisor  Patient Concerns: None

## 2018-01-27 NOTE — Patient Instructions (Signed)
[name] , Thank you for taking time to come for your Medicare Wellness Visit. I appreciate your ongoing commitment to your health goals. Please review the following plan we discussed and let me know if I can assist you in the future.   Screening recommendations/referrals: Colonoscopy due at age 47 Recommended yearly ophthalmology/optometry visit for glaucoma screening and checkup Recommended yearly dental visit for hygiene and checkup  Vaccinations: Influenza vaccine due 2019 fall season Pneumococcal vaccine due at age 60 Tdap vaccine up to date, due 04/24/2024 Shingles vaccine due at age 69    Advanced directives: Advance directive discussed with you today. I have provided a copy for you to complete at home and have notarized. Once this is complete please bring a copy in to our office so we can scan it into your chart.  Conditions/risks identified: none  Next appointment: Sherrie Mustache, NP 02/02/2018 @ 10:45am             Tyson Dense, RN 01/29/2019 @ 12:45pm  Preventive Care 40-64 Years, Male Preventive care refers to lifestyle choices and visits with your health care provider that can promote health and wellness. What does preventive care include?  A yearly physical exam. This is also called an annual well check.  Dental exams once or twice a year.  Routine eye exams. Ask your health care provider how often you should have your eyes checked.  Personal lifestyle choices, including:  Daily care of your teeth and gums.  Regular physical activity.  Eating a healthy diet.  Avoiding tobacco and drug use.  Limiting alcohol use.  Practicing safe sex.  Taking low-dose aspirin every day starting at age 12. What happens during an annual well check? The services and screenings done by your health care provider during your annual well check will depend on your age, overall health, lifestyle risk factors, and family history of disease. Counseling  Your health care provider may  ask you questions about your:  Alcohol use.  Tobacco use.  Drug use.  Emotional well-being.  Home and relationship well-being.  Sexual activity.  Eating habits.  Work and work Statistician. Screening  You may have the following tests or measurements:  Height, weight, and BMI.  Blood pressure.  Lipid and cholesterol levels. These may be checked every 5 years, or more frequently if you are over 92 years old.  Skin check.  Lung cancer screening. You may have this screening every year starting at age 30 if you have a 30-pack-year history of smoking and currently smoke or have quit within the past 15 years.  Fecal occult blood test (FOBT) of the stool. You may have this test every year starting at age 21.  Flexible sigmoidoscopy or colonoscopy. You may have a sigmoidoscopy every 5 years or a colonoscopy every 10 years starting at age 55.  Prostate cancer screening. Recommendations will vary depending on your family history and other risks.  Hepatitis C blood test.  Hepatitis B blood test.  Sexually transmitted disease (STD) testing.  Diabetes screening. This is done by checking your blood sugar (glucose) after you have not eaten for a while (fasting). You may have this done every 1-3 years. Discuss your test results, treatment options, and if necessary, the need for more tests with your health care provider. Vaccines  Your health care provider may recommend certain vaccines, such as:  Influenza vaccine. This is recommended every year.  Tetanus, diphtheria, and acellular pertussis (Tdap, Td) vaccine. You may need a Td booster every 10  years.  Zoster vaccine. You may need this after age 80.  Pneumococcal 13-valent conjugate (PCV13) vaccine. You may need this if you have certain conditions and have not been vaccinated.  Pneumococcal polysaccharide (PPSV23) vaccine. You may need one or two doses if you smoke cigarettes or if you have certain conditions. Talk to your  health care provider about which screenings and vaccines you need and how often you need them. This information is not intended to replace advice given to you by your health care provider. Make sure you discuss any questions you have with your health care provider. Document Released: 08/29/2015 Document Revised: 04/21/2016 Document Reviewed: 06/03/2015 Elsevier Interactive Patient Education  2017 [place] Prevention in the Home Falls can cause injuries. They can happen to people of all ages. There are many things you can do to make your home safe and to help prevent falls. What can I do on the outside of my home?  Regularly fix the edges of walkways and driveways and fix any cracks.  Remove anything that might make you trip as you walk through a door, such as a raised step or threshold.  Trim any bushes or trees on the path to your home.  Use bright outdoor lighting.  Clear any walking paths of anything that might make someone trip, such as rocks or tools.  Regularly check to see if handrails are loose or broken. Make sure that both sides of any steps have handrails.  Any raised decks and porches should have guardrails on the edges.  Have any leaves, snow, or ice cleared regularly.  Use sand or salt on walking paths during winter.  Clean up any spills in your garage right away. This includes oil or grease spills. What can I do in the bathroom?  Use night lights.  Install grab bars by the toilet and in the tub and shower. Do not use towel bars as grab bars.  Use non-skid mats or decals in the tub or shower.  If you need to sit down in the shower, use a plastic, non-slip stool.  Keep the floor dry. Clean up any water that spills on the floor as soon as it happens.  Remove soap buildup in the tub or shower regularly.  Attach bath mats securely with double-sided non-slip rug tape.  Do not have throw rugs and other things on the floor that can make you trip. What  can I do in the bedroom?  Use night lights.  Make sure that you have a light by your bed that is easy to reach.  Do not use any sheets or blankets that are too big for your bed. They should not hang down onto the floor.  Have a firm chair that has side arms. You can use this for support while you get dressed.  Do not have throw rugs and other things on the floor that can make you trip. What can I do in the kitchen?  Clean up any spills right away.  Avoid walking on wet floors.  Keep items that you use a lot in easy-to-reach places.  If you need to reach something above you, use a strong step stool that has a grab bar.  Keep electrical cords out of the way.  Do not use floor polish or wax that makes floors slippery. If you must use wax, use non-skid floor wax.  Do not have throw rugs and other things on the floor that can make you trip. What can I  do with my stairs?  Do not leave any items on the stairs.  Make sure that there are handrails on both sides of the stairs and use them. Fix handrails that are broken or loose. Make sure that handrails are as long as the stairways.  Check any carpeting to make sure that it is firmly attached to the stairs. Fix any carpet that is loose or worn.  Avoid having throw rugs at the top or bottom of the stairs. If you do have throw rugs, attach them to the floor with carpet tape.  Make sure that you have a light switch at the top of the stairs and the bottom of the stairs. If you do not have them, ask someone to add them for you. What else can I do to help prevent falls?  Wear shoes that:  Do not have high heels.  Have rubber bottoms.  Are comfortable and fit you well.  Are closed at the toe. Do not wear sandals.  If you use a stepladder:  Make sure that it is fully opened. Do not climb a closed stepladder.  Make sure that both sides of the stepladder are locked into place.  Ask someone to hold it for you, if  possible.  Clearly mark and make sure that you can see:  Any grab bars or handrails.  First and last steps.  Where the edge of each step is.  Use tools that help you move around (mobility aids) if they are needed. These include:  Canes.  Walkers.  Scooters.  Crutches.  Turn on the lights when you go into a dark area. Replace any light bulbs as soon as they burn out.  Set up your furniture so you have a clear path. Avoid moving your furniture around.  If any of your floors are uneven, fix them.  If there are any pets around you, be aware of where they are.  Review your medicines with your doctor. Some medicines can make you feel dizzy. This can increase your chance of falling. Ask your doctor what other things that you can do to help prevent falls. This information is not intended to replace advice given to you by your health care provider. Make sure you discuss any questions you have with your health care provider. Document Released: 05/29/2009 Document Revised: 01/08/2016 Document Reviewed: 09/06/2014 Elsevier Interactive Patient Education  2017 [employer].

## 2018-02-02 ENCOUNTER — Encounter: Payer: Medicare Other | Admitting: Nurse Practitioner

## 2018-02-06 ENCOUNTER — Telehealth: Payer: Self-pay

## 2018-02-06 ENCOUNTER — Ambulatory Visit: Payer: Medicare Other | Admitting: Nurse Practitioner

## 2018-02-06 ENCOUNTER — Ambulatory Visit (INDEPENDENT_AMBULATORY_CARE_PROVIDER_SITE_OTHER): Payer: Medicare Other | Admitting: Nurse Practitioner

## 2018-02-06 ENCOUNTER — Encounter: Payer: Self-pay | Admitting: Nurse Practitioner

## 2018-02-06 VITALS — BP 124/72 | HR 72 | Temp 98.2°F | Ht 65.0 in | Wt 115.0 lb

## 2018-02-06 DIAGNOSIS — N185 Chronic kidney disease, stage 5: Secondary | ICD-10-CM

## 2018-02-06 DIAGNOSIS — R1084 Generalized abdominal pain: Secondary | ICD-10-CM | POA: Diagnosis not present

## 2018-02-06 DIAGNOSIS — N186 End stage renal disease: Secondary | ICD-10-CM

## 2018-02-06 DIAGNOSIS — R112 Nausea with vomiting, unspecified: Secondary | ICD-10-CM

## 2018-02-06 DIAGNOSIS — E1122 Type 2 diabetes mellitus with diabetic chronic kidney disease: Secondary | ICD-10-CM | POA: Diagnosis not present

## 2018-02-06 DIAGNOSIS — Z794 Long term (current) use of insulin: Secondary | ICD-10-CM

## 2018-02-06 DIAGNOSIS — R197 Diarrhea, unspecified: Secondary | ICD-10-CM | POA: Diagnosis not present

## 2018-02-06 DIAGNOSIS — D649 Anemia, unspecified: Secondary | ICD-10-CM

## 2018-02-06 NOTE — Patient Instructions (Signed)
To follow up with nephrologist as soon as possible.   Will get lab work today  To use florastor by mouth daily

## 2018-02-06 NOTE — Telephone Encounter (Signed)
I called [facility] to find out how patient can obtain the nepro carb steady. Does he need a prescription or can it be bought OTC. I left a message asking for a call back from their clinical staff.

## 2018-02-06 NOTE — Progress Notes (Signed)
Careteam: Patient Care Team: [name], NP as PCP - General (Geriatric Medicine) [name], MD as Referring Physician (Ophthalmology) [name], MD as Consulting Physician (Cardiology) [name], MD as Consulting Physician (Nephrology) [name], MD as Referring Physician (Surgery)  Advanced Directive information Does Patient Have a Medical Advance Directive?: No  Allergies  Allergen Reactions  . Lactose Intolerance (Gi) Diarrhea    No whole milk!!  . Other Other (See Comments)    Soy milk burns his tongue  . Pollen Extract Other (See Comments)    Itching, watery eyes and sneezing.  . Tape Rash    PLEASE USE COBAN WRAP OR PAPER TAPE     Chief Complaint  Patient presents with  . Hospitalization Follow-up    Pt was recently admitted to [facility] 6/5 to 6/8.      HPI: Patient is a 47 y.o. male seen in the office today for hospital follow up.  He does have a significant past medical history for nonischemic cardiomyopathy, end-stage renal disease status post renal transplant, hypertension, type 2 diabetes mellitus and recent intestinal infection due to E. Coli.  He was noted to be severely anemic with AKI at last OV and referred to hospital for further evaluation. He received IV fluids, 1 unit PRBC and aranesp prior to leaving the hospital. His hgb on discharge from hospital 7.7.  Continues to be on immunosuppressive therapy with tacrolimus, and mycophenolate.  Antibiotic prophylaxis with dapsone.  Still having ongoing diarrhea after his e coli infection. Stopped for abt a month then restart for the last month. Has effected his appetite  Having nausea as well. Has had vomiting 2-3 days ago. Keeping fluids down.  Having a lot of fluid loss via vomiting and diarrhea.  Reports he is having abdominal pain at night when he turns on his left side.  Phenergan helped with nausea but not with pain.    Review of Systems:  Review of Systems  Constitutional:  Positive for chills, malaise/fatigue and weight loss. Negative for fever.  Respiratory: Negative for cough, sputum production, shortness of breath and wheezing.   Cardiovascular: Negative for chest pain, palpitations, orthopnea and leg swelling.  Gastrointestinal: Positive for abdominal pain, diarrhea (having about 3 loose stools a day), nausea and vomiting. Negative for blood in stool, constipation and heartburn.       Early satiety   Genitourinary: Negative for dysuria, frequency, hematuria and urgency.  Neurological: Positive for weakness. Negative for dizziness and headaches.       Unsteady  Endo/Heme/Allergies: Negative for environmental allergies.    Past Medical History:  Diagnosis Date  . Anemia   . CKD (chronic kidney disease), stage III ([place])   . 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 ESRD (end stage renal disease) on dialysis ([place])    1.Initiated HD via right per cate 12/30/2008 (MWF schedule at [facility]) 2.Left upper extremity A-V fistula by Dr Oneida Alar 12/30/2008 3.Aemia: Received Venofer 12/30/08 Ferritin 179 % sat 12 12/23/08, 4. Secondary Hyperparathyroidism- PTH 188, P 5.4, Ca 9.2 12/2008  . Hemodialysis-associated hypotension    coreg d/c'd  . Herniated lumbar disc without myelopathy   . History of blood transfusion   . Hyperparathyroidism due to renal insufficiency ([place])   . Hypertension    resloved after initating HD, now with post HD hypotension  .  Hypocalcemia   . Hypomagnesemia   . Kidney transplant as cause of abnormal reaction or later complication 0626   [facility]  . Non-ischemic cardiomyopathy ([place])    EF 20-25% 12/23/2008>>EF 40-45% 03/2009>>EF ??? 7/?/2011  . Occult blood positive stool   . Psoriasis    scalp  . Thrombocytopenia ([place])   . Vitamin B 12 deficiency    Past Surgical History:  Procedure Laterality Date    . AV FISTULA PLACEMENT     left  . AV FISTULA PLACEMENT  09/12/2012   Procedure: INSERTION OF ARTERIOVENOUS (AV) GORE-TEX GRAFT ARM;  Surgeon: Elam Dutch, MD;  Location: [place];  Service: Vascular;  Laterality: Left;  KIDNEY TRANSPLANT FAILED  . CATARACT EXTRACTION, BILATERAL    . EYE SURGERY    . INSERTION OF DIALYSIS CATHETER  07/14/2012   Procedure: INSERTION OF DIALYSIS CATHETER;  Surgeon: Mal Misty, MD;  Location: [place];  Service: Vascular;  Laterality: Right;  Ultrasound Guided Insertion of Internal Jugular Dialysis Catheter  . 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 KIDNEY TRANSPLANT  12/2013   second kidney transplant  . lumbar disc sugery   10/17/2017  . LUMBAR DISC SURGERY    . NEPHRECTOMY TRANSPLANTED ORGAN  08/11/12  . PHOTOCOAGULATION     for diabetic retinopathy 08/1999  . SHUNTOGRAM N/A 07/11/2012   Procedure: Earney Mallet;  Surgeon: Serafina Mitchell, MD;  Location: [facility] CATH LAB;  Service: Cardiovascular;  Laterality: N/A;   Social History:   reports that he has never smoked. He has never used smokeless tobacco. He reports that he does not drink alcohol or use drugs.  Family History  Problem Relation Age of Onset  . Diabetes Father   . Kidney disease Father   . Other Father        amputation  . Hypertension Mother   . Dementia Mother   . Diabetes Sister   . Hypertension Sister   . Diabetes Sister   . Kidney disease Sister   . Diabetes Sister   . Hypertension Brother   . Diabetes Brother   . CAD Neg Hx   . Sudden Cardiac Death Neg Hx   . Congestive Heart Failure Neg Hx   . Colon cancer Neg Hx   . Rectal cancer Neg Hx   . Esophageal cancer Neg Hx   . Liver cancer Neg Hx     Medications: Patient's Medications  New Prescriptions   No medications on file  Previous Medications   ACETAMINOPHEN (TYLENOL) 500 MG TABLET    Take 1,000 mg by mouth every 6 (six) hours as needed for mild pain.   ASPIRIN EC 81 MG TABLET    Take 81  mg by mouth daily.   ATORVASTATIN (LIPITOR) 10 MG TABLET    Take one tablet by mouth at bedtime for cholesterol.   CALCITRIOL (ROCALTROL) 0.5 MCG CAPSULE    Take 1 capsule by mouth daily.    CALCIUM CARBONATE (OS-CAL - DOSED IN MG OF ELEMENTAL CALCIUM) 1250 MG TABLET    Take 1 tablet by mouth 3 (three) times daily with meals.   CARVEDILOL (COREG) 6.25 MG TABLET    Take 1 tablet (6.25 mg total) by mouth 2 (two) times daily with a meal.   DAPSONE 25 MG TABLET    Take 50 mg by mouth daily.   FOLIC ACID (FOLVITE) 1 MG TABLET    Take 1 mg by mouth daily.  GLUCOSE BLOOD (ACCU-CHEK AVIVA) TEST STRIP    Check blood sugar 4 times daily and before bed time.   INSULIN ASPART (NOVOLOG) 100 UNIT/ML FLEXPEN    Inject 12 Units into the skin 3 (three) times daily before meals.   INSULIN DEGLUDEC (TRESIBA FLEXTOUCH) 100 UNIT/ML SOPN FLEXTOUCH PEN    Inject 5 Units into the skin daily at 10 pm.   INSULIN PEN NEEDLE 31G X 5 MM MISC    5 Units/oz/day by Does not apply route at bedtime. Use 4 times daily to inject insulin to skin   KETOCONAZOLE (NIZORAL) 2 % SHAMPOO    Apply topically 2 (two) times a week. Wash hair and beard.  Leave shampoo on skin for 5 mins before washing off.   LANCETS (ACCU-CHEK MULTICLIX) LANCETS    Use as directed   MYCOPHENOLATE (MYFORTIC) 180 MG EC TABLET    Take 720 mg by mouth See admin instructions. 0800, 2000   NUTRITIONAL SUPPLEMENTS (FEEDING SUPPLEMENT, NEPRO CARB STEADY,) LIQD    Take 237 mLs by mouth 3 (three) times daily as needed (Supplement).   OMEPRAZOLE (PRILOSEC) 20 MG CAPSULE    Take 1 capsule (20 mg total) by mouth daily.   SODIUM BICARBONATE 650 MG TABLET    Take 1,300 mg by mouth 3 (three) times daily.    TACROLIMUS (PROGRAF) 1 MG CAPSULE    Take 5-6 mg by mouth See admin instructions. 6 mg in the morning and 5 mg in the evening - 1000, 2200  Modified Medications   No medications on file  Discontinued Medications   No medications on file     Physical Exam:  Vitals:    02/06/18 1403  BP: 124/72  Pulse: 72  Temp: 98.2 F (36.8 C)  TempSrc: Oral  SpO2: 95%  Weight: 115 lb (52.2 kg)  Height: 5\' 5"  (1.651 m)   Body mass index is 19.14 kg/m.  Physical Exam  Constitutional: He is oriented to person, place, and time. He appears well-developed and well-nourished. No distress.  HENT:  Head: Normocephalic and atraumatic.  Mouth/Throat: Oropharynx is clear and moist. No oropharyngeal exudate.  Eyes: Pupils are equal, round, and reactive to light. Conjunctivae and EOM are normal.  Neck: Normal range of motion. Neck supple. No JVD present.  Cardiovascular: Normal rate and regular rhythm.  Murmur heard. Pulmonary/Chest: Effort normal and breath sounds normal. No respiratory distress. He has no wheezes. He exhibits no tenderness.  Abdominal: Soft. Bowel sounds are normal. He exhibits no distension. There is no tenderness. There is guarding.  Musculoskeletal: He exhibits no edema or tenderness.  Neurological: He is alert and oriented to person, place, and time.  Skin: Skin is warm and dry. He is not diaphoretic. There is pallor.  Psychiatric: He has a normal mood and affect.    Labs reviewed: Basic Metabolic Panel: Recent Labs    12/05/17 0428 12/06/17 0349 12/07/17 0249  01/18/18 1134 01/19/18 1928 01/21/18 0548  NA 135 135 135   < > 134* 137 137  K 3.0* 3.8 4.0   < > 4.4 3.5 3.4*  CL 95* 99* 100*   < > 114* 115* 116*  CO2 30 26 25    < > 12* 14* 14*  GLUCOSE 105* 117* 117*   < > 239* 140* 119*  BUN 20 15 15    < > 76* 61* 45*  CREATININE 3.74* 3.30* 3.02*   < > 5.75* 4.66* 3.96*  CALCIUM 7.9* 8.0* 8.7*   < > 10.9* 9.9  10.2  MG 0.7* 1.3* 2.0  --   --   --   --    < > = values in this interval not displayed.   Liver Function Tests: Recent Labs    02/09/17 0616 11/29/17 1855 12/20/17 0944 01/16/18 1220 01/18/18 1134  AST 18 12* 13 7* 11*  ALT 15* 12* 12 3* 7*  ALKPHOS 112 94  --   --  54  BILITOT 0.4 0.6 0.5 0.3 0.4  PROT 5.5* 6.9 6.6  6.7 6.7  ALBUMIN 3.0* 4.3  --   --  4.4   Recent Labs    11/29/17 1855  LIPASE 39   No results for input(s): AMMONIA in the last 8760 hours. CBC: Recent Labs    12/20/17 0944 01/16/18 1220 01/18/18 1134 01/18/18 2020 01/21/18 0548  WBC 4.4 3.9 3.9* 3.5* 3.1*  NEUTROABS 2,187 2,246  --   --  1.1*  HGB 9.0* 7.3* 7.5* 8.7* 7.7*  HCT 28.2* 22.8* 23.9* 27.2* 24.5*  MCV 86.5 87.4 88.8 88.9 89.1  PLT 106* 104* 103* 81* 65*   Lipid Panel: Recent Labs    12/20/17 0944  CHOL 188  HDL 32*  LDLCALC 118*  TRIG 269*  CHOLHDL 5.9*   TSH: No results for input(s): TSH in the last 8760 hours. A1C: Lab Results  Component Value Date   HGBA1C 6.2 (H) 12/20/2017     Assessment/Plan 1. Diarrhea, unspecified type Ongoing diarrhea and now with nausea and some abdominal pain in the evenings. Reports this originally improved but now ongoing, stool studies were negative. Limited PO intake. Recommend to try to increase fluids at this time.  - CBC with Differential/Platelets - Amylase - COMPLETE METABOLIC PANEL WITH GFR - Lipase -CT abdomen  2. Type 2 diabetes mellitus with stage 5 chronic kidney disease not on chronic dialysis, with long-term current use of insulin (HCC) Currently taking 5 units TID, A1c 6.2 in May  3. End stage renal disease ([place]) S/p transplant with AKI has not followed up with nephrologist since discharge. Recommended close follow up with nephrologist.   4. Anemia, unspecified type Symptomatic anemia, received 1 unit PRBC on hospital admission. received aranesp prior to discharge, nephrology to follow up and may need ongoing injections.   - CBC with Differential/Platelets  Next appt: 02/06/2018 [name]. [place] [phone]

## 2018-02-07 LAB — CBC WITH DIFFERENTIAL/PLATELET
BASOS ABS: 39 {cells}/uL (ref 0–200)
BASOS PCT: 0.8 %
EOS ABS: 309 {cells}/uL (ref 15–500)
Eosinophils Relative: 6.3 %
HCT: 28.5 % — ABNORMAL LOW (ref 38.5–50.0)
Hemoglobin: 9 g/dL — ABNORMAL LOW (ref 13.2–17.1)
Lymphs Abs: 1152 cells/uL (ref 850–3900)
MCH: 28 pg (ref 27.0–33.0)
MCHC: 31.6 g/dL — ABNORMAL LOW (ref 32.0–36.0)
MCV: 88.8 fL (ref 80.0–100.0)
MONOS PCT: 17.2 %
MPV: 12.7 fL — AB (ref 7.5–12.5)
NEUTROS PCT: 52.2 %
Neutro Abs: 2558 cells/uL (ref 1500–7800)
Platelets: 123 10*3/uL — ABNORMAL LOW (ref 140–400)
RBC: 3.21 10*6/uL — ABNORMAL LOW (ref 4.20–5.80)
RDW: 14.1 % (ref 11.0–15.0)
TOTAL LYMPHOCYTE: 23.5 %
WBC: 4.9 10*3/uL (ref 3.8–10.8)
WBCMIX: 843 {cells}/uL (ref 200–950)

## 2018-02-07 LAB — COMPLETE METABOLIC PANEL WITH GFR
AG Ratio: 2 (calc) (ref 1.0–2.5)
ALBUMIN MSPROF: 4.4 g/dL (ref 3.6–5.1)
ALT: 4 U/L — ABNORMAL LOW (ref 9–46)
AST: 8 U/L — ABNORMAL LOW (ref 10–40)
Alkaline phosphatase (APISO): 50 U/L (ref 40–115)
BILIRUBIN TOTAL: 0.4 mg/dL (ref 0.2–1.2)
BUN / CREAT RATIO: 9 (calc) (ref 6–22)
BUN: 55 mg/dL — AB (ref 7–25)
CO2: 12 mmol/L — ABNORMAL LOW (ref 20–32)
Calcium: 9.3 mg/dL (ref 8.6–10.3)
Chloride: 112 mmol/L — ABNORMAL HIGH (ref 98–110)
Creat: 6.25 mg/dL — ABNORMAL HIGH (ref 0.60–1.35)
GFR, EST AFRICAN AMERICAN: 11 mL/min/{1.73_m2} — AB (ref >=60)
GFR, Est Non African American: 10 mL/min/{1.73_m2} — ABNORMAL LOW (ref >=60)
GLOBULIN: 2.2 g/dL (ref 1.9–3.7)
GLUCOSE: 212 mg/dL — AB (ref 65–99)
Potassium: 4.5 mmol/L (ref 3.5–5.3)
Sodium: 134 mmol/L — ABNORMAL LOW (ref 135–146)
Total Protein: 6.6 g/dL (ref 6.1–8.1)

## 2018-02-07 LAB — AMYLASE: Amylase: 59 U/L (ref 21–101)

## 2018-02-07 LAB — LIPASE: Lipase: 33 U/L (ref 7–60)

## 2018-02-07 NOTE — Telephone Encounter (Signed)
I left a message on clinical intake line asking for a return call regarding concerns about the nepro carb steady.    [facility] [phone]

## 2018-02-08 ENCOUNTER — Telehealth: Payer: Self-pay

## 2018-02-08 NOTE — Telephone Encounter (Signed)
I spoke with patient's wife and she informed me that patient is refusing go to hospital. He stated that he is tired of being admitted to the hospital.   Patient's wife has encouraged and begged patient to go and it has been recommended by this office and by [facility] that patient seek treatment at the ED but he still refuses.

## 2018-02-08 NOTE — Telephone Encounter (Signed)
Per Epic- patient has gone to ED as recommended. I called patient's wife to follow up on ED recommendation but there was no answer. I left a message asking that she or patient call the office as soon as possible.   Notes recorded by Logan Bores, CMA on 02/07/2018 at 9:40 AM EDT  I called patient's wife and was able to make contact with patient, patient verbalized understanding of labs and stated he would go to the Hospital as instructed.  Left message on voicemail for patient to return call when available, I indicated that this is an Urgent Call. ------  Notes recorded by [name], NP on 02/07/2018 at 9:18 AM EDT hgb has improved but Cr is MUCH worse. Recommend to go back to the hospital due to acute kidney injury which is most likely due to dehydration from diarrhea and N/V

## 2018-02-08 NOTE — Telephone Encounter (Signed)
Noted, thank you for following up on this

## 2018-02-17 ENCOUNTER — Encounter: Payer: Medicare Other | Admitting: Nurse Practitioner

## 2018-03-01 ENCOUNTER — Inpatient Hospital Stay: Admission: RE | Admit: 2018-03-01 | Payer: Medicare Other | Source: Ambulatory Visit

## 2018-03-08 ENCOUNTER — Other Ambulatory Visit: Payer: Self-pay | Admitting: Nurse Practitioner

## 2018-06-23 ENCOUNTER — Other Ambulatory Visit: Payer: Self-pay | Admitting: Nurse Practitioner

## 2018-06-23 NOTE — Telephone Encounter (Signed)
A medication refill was received from pharmacy for atorvastatin 10 mg. Request was pended to provider due to patient not having an upcoming appt.

## 2018-06-23 NOTE — Telephone Encounter (Signed)
NEEDS APPT BEFORE ANY ADDITIONAL REFILLS

## 2018-07-19 ENCOUNTER — Other Ambulatory Visit: Payer: Self-pay

## 2018-07-19 NOTE — Patient Outreach (Signed)
[facility]) Care Management  07/19/2018  [name] 1971-06-03 [id number]   Medication Adherence call to [name] left a message for patient to call back patient is due on Atorvastatin 10 mg. [name] is showing past due under [place].   [place] Management Direct Dial [phone]  Fax [phone] [name]

## 2018-08-01 ENCOUNTER — Encounter: Payer: Self-pay | Admitting: Nurse Practitioner

## 2018-08-01 ENCOUNTER — Ambulatory Visit (INDEPENDENT_AMBULATORY_CARE_PROVIDER_SITE_OTHER): Payer: Medicare Other | Admitting: Nurse Practitioner

## 2018-08-01 ENCOUNTER — Ambulatory Visit: Payer: Self-pay | Admitting: Nurse Practitioner

## 2018-08-01 VITALS — BP 122/78 | HR 77 | Temp 98.1°F | Ht 65.0 in | Wt 126.6 lb

## 2018-08-01 DIAGNOSIS — K219 Gastro-esophageal reflux disease without esophagitis: Secondary | ICD-10-CM

## 2018-08-01 DIAGNOSIS — Z794 Long term (current) use of insulin: Secondary | ICD-10-CM

## 2018-08-01 DIAGNOSIS — N186 End stage renal disease: Secondary | ICD-10-CM | POA: Diagnosis not present

## 2018-08-01 DIAGNOSIS — E1122 Type 2 diabetes mellitus with diabetic chronic kidney disease: Secondary | ICD-10-CM

## 2018-08-01 DIAGNOSIS — Z91199 Patient's noncompliance with other medical treatment and regimen due to unspecified reason: Secondary | ICD-10-CM

## 2018-08-01 DIAGNOSIS — I10 Essential (primary) hypertension: Secondary | ICD-10-CM

## 2018-08-01 DIAGNOSIS — N185 Chronic kidney disease, stage 5: Secondary | ICD-10-CM

## 2018-08-01 DIAGNOSIS — E785 Hyperlipidemia, unspecified: Secondary | ICD-10-CM | POA: Diagnosis not present

## 2018-08-01 DIAGNOSIS — D649 Anemia, unspecified: Secondary | ICD-10-CM | POA: Diagnosis not present

## 2018-08-01 DIAGNOSIS — Z9119 Patient's noncompliance with other medical treatment and regimen: Secondary | ICD-10-CM

## 2018-08-01 MED ORDER — FREESTYLE LIBRE 14 DAY READER DEVI: 1.0000 | Freq: Four times a day (QID) | 12 refills | Status: DC

## 2018-08-01 MED ORDER — FREESTYLE LIBRE 14 DAY SENSOR MISC: 1.0000 | Freq: Four times a day (QID) | 12 refills | Status: DC

## 2018-08-01 NOTE — Patient Instructions (Signed)
Follow up in 3 months with Dr Mariea Clonts for routine follow up.   Sign medical release for Dr Layla Barter office

## 2018-08-01 NOTE — Progress Notes (Signed)
Careteam: Patient Care Team: [name], NP as PCP - General (Geriatric Medicine) [name], MD as Referring Physician (Ophthalmology) [name], MD as Consulting Physician (Cardiology) [name], MD as Consulting Physician (Nephrology) [name], MD as Referring Physician (Surgery)  Advanced Directive information Does Patient Have a Medical Advance Directive?: No  Allergies  Allergen Reactions  . Lactose Intolerance (Gi) Diarrhea    No whole milk!!  . Other Other (See Comments)    Soy milk burns his tongue  . Pollen Extract Other (See Comments)    Itching, watery eyes and sneezing.  . Tape Rash    PLEASE USE COBAN WRAP OR PAPER TAPE     Chief Complaint  Patient presents with  . Medical Management of Chronic Issues    Routine follow up, Scat Form completetion , New Glucometer request     HPI: Patient is a 47 y.o. male seen in the office today for follow up.  Pt has not been seen since June. At that time he was seen for hospital follow up and noted to have AKI on labs in office but did not wish to go to hospital or have additional follow up here.  States he had been following with nephrologist but we have no received any of these records.  Wife reports they have had major transportation issues and phones have been turned off due to money issue.  No current diarrhea. Overall feeling much better  States he has been doing very well lately. Following with nephrologist routinely.   CKD- s/p transplant, last labs yesterday at Nephrologist.  Taking prograf, dapsone, sodium bicarbonate and calcitriol  Reports nephrologist is prescribing all his medications, dapsone, and insulin. States nephrologist is managing his diabetes as well. States he has a Personnel officer which helps manage his diabetes. Reports his A1c was checked yesterday as well as a lot of other routine blood work.   Hyperlipidemia- has been taking lipitor, has not eating since this  morning.   GERD- controlled on omeprazole  htn- continues on carvedilol twice daily   Reports he had back surgery in March 2019 at [facility]. ?no records of this.   Discussed the importance of proper follow up- pt has not been seen since 01/2018 and has not followed up since.    Review of Systems:  Review of Systems  Constitutional: Negative for chills, fever and weight loss.  HENT: Negative for tinnitus.   Respiratory: Negative for cough, sputum production and shortness of breath.   Cardiovascular: Negative for chest pain, palpitations and leg swelling.  Gastrointestinal: Negative for abdominal pain, constipation, diarrhea and heartburn.  Genitourinary: Negative for dysuria, frequency and urgency.  Musculoskeletal: Negative for back pain, falls, joint pain and myalgias.  Skin: Negative.   Neurological: Negative for dizziness and headaches.  Psychiatric/Behavioral: Negative for depression and memory loss. The patient does not have insomnia.     Past Medical History:  Diagnosis Date  . Anemia   . CKD (chronic kidney disease), stage III ([place])   . Diabetes mellitus 12/2008   A1C 8.3 12/23/08, 24hr urine protein 2793 mg/day, SPEP neg, proliferative BDR s/p photocoagulation 08/2009 done at [facility] and f/u by DR Groat  . ESRD (end stage renal disease) on dialysis ([place])    1.Initiated HD via right per cate 12/30/2008 (MWF schedule at [facility]) 2.Left upper extremity A-V fistula by Dr Oneida Alar 12/30/2008 3.Aemia: Received Venofer 12/30/08 Ferritin 179 % sat 12 12/23/08, 4. Secondary Hyperparathyroidism- PTH  188, P 5.4, Ca 9.2 12/2008  . Hemodialysis-associated hypotension    coreg d/c'd  . Herniated lumbar disc without myelopathy   . History of blood transfusion   . Hyperparathyroidism due to renal insufficiency ([place])   . Hypertension    resloved after initating HD, now with post HD hypotension  . Hypocalcemia   . Hypomagnesemia   . Kidney transplant as cause of  abnormal reaction or later complication 1610   [facility]  . Non-ischemic cardiomyopathy ([place])    EF 20-25% 12/23/2008>>EF 40-45% 03/2009>>EF ??? 7/?/2011  . Occult blood positive stool   . Psoriasis    scalp  . Thrombocytopenia ([place])   . Vitamin B 12 deficiency    Past Surgical History:  Procedure Laterality Date  . AV FISTULA PLACEMENT     left  . AV FISTULA PLACEMENT  09/12/2012   Procedure: INSERTION OF ARTERIOVENOUS (AV) GORE-TEX GRAFT ARM;  Surgeon: Elam Dutch, MD;  Location: [facility];  Service: Vascular;  Laterality: Left;  KIDNEY TRANSPLANT FAILED  . CATARACT EXTRACTION, BILATERAL    . EYE SURGERY    . INSERTION OF DIALYSIS CATHETER  07/14/2012   Procedure: INSERTION OF DIALYSIS CATHETER;  Surgeon: Mal Misty, MD;  Location: [place];  Service: Vascular;  Laterality: Right;  Ultrasound Guided Insertion of Internal Jugular Dialysis Catheter  . 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 KIDNEY TRANSPLANT  12/2013   second kidney transplant  . lumbar disc sugery   10/17/2017  . LUMBAR DISC SURGERY    . NEPHRECTOMY TRANSPLANTED ORGAN  08/11/12  . PHOTOCOAGULATION     for diabetic retinopathy 08/1999  . SHUNTOGRAM N/A 07/11/2012   Procedure: Earney Mallet;  Surgeon: Serafina Mitchell, MD;  Location: [facility] CATH LAB;  Service: Cardiovascular;  Laterality: N/A;   Social History:   reports that he has never smoked. He has never used smokeless tobacco. He reports that he does not drink alcohol or use drugs.  Family History  Problem Relation Age of Onset  . Diabetes Father   . Kidney disease Father   . Other Father        amputation  . Hypertension Mother   . Dementia Mother   . Diabetes Sister   . Hypertension Sister   . Diabetes Sister   . Kidney disease Sister   . Diabetes Sister   . Hypertension Brother   . Diabetes Brother   . CAD Neg Hx   . Sudden Cardiac Death Neg Hx   . Congestive Heart Failure Neg Hx   . Colon cancer Neg Hx   . Rectal  cancer Neg Hx   . Esophageal cancer Neg Hx   . Liver cancer Neg Hx     Medications: Patient's Medications  New Prescriptions   No medications on file  Previous Medications   ACETAMINOPHEN (TYLENOL) 500 MG TABLET    Take 1,000 mg by mouth every 6 (six) hours as needed for mild pain.   ASPIRIN EC 81 MG TABLET    Take 81 mg by mouth daily.   ATORVASTATIN (LIPITOR) 10 MG TABLET    TAKE 1 TABLET BY MOUTH AT BEDTIME FOR CHOLESTEROL   CALCITRIOL (ROCALTROL) 0.5 MCG CAPSULE    Take 1 capsule by mouth daily.    CALCIUM CARBONATE (OS-CAL - DOSED IN MG OF ELEMENTAL CALCIUM) 1250 MG TABLET    Take 1 tablet by mouth 3 (three) times daily with meals.   CARVEDILOL (COREG) 6.25  MG TABLET    Take 1 tablet (6.25 mg total) by mouth 2 (two) times daily with a meal.   DAPSONE 25 MG TABLET    Take 50 mg by mouth daily.   FOLIC ACID (FOLVITE) 1 MG TABLET    Take 1 mg by mouth daily.    INSULIN ASPART (NOVOLOG) 100 UNIT/ML FLEXPEN    Inject 12 Units into the skin 3 (three) times daily before meals.   INSULIN DEGLUDEC (TRESIBA FLEXTOUCH) 100 UNIT/ML SOPN FLEXTOUCH PEN    Inject 5 Units into the skin daily at 10 pm.   INSULIN PEN NEEDLE 31G X 5 MM MISC    5 Units/oz/day by Does not apply route at bedtime. Use 4 times daily to inject insulin to skin   KETOCONAZOLE (NIZORAL) 2 % SHAMPOO    Apply topically 2 (two) times a week. Wash hair and beard.  Leave shampoo on skin for 5 mins before washing off.   MYCOPHENOLATE (MYFORTIC) 180 MG EC TABLET    Take 720 mg by mouth See admin instructions. 0800, 2000   OMEPRAZOLE (PRILOSEC) 20 MG CAPSULE    Take 1 capsule (20 mg total) by mouth daily.   SODIUM BICARBONATE 650 MG TABLET    Take 1,300 mg by mouth 3 (three) times daily.    TACROLIMUS (PROGRAF) 1 MG CAPSULE    Take 5-6 mg by mouth See admin instructions. 6 mg in the morning and 5 mg in the evening - 1000, 2200  Modified Medications   No medications on file  Discontinued Medications   GLUCOSE BLOOD (ACCU-CHEK AVIVA)  TEST STRIP    Check blood sugar 4 times daily and before bed time.   GLUCOSE MONITORING KIT (FREESTYLE) MONITORING KIT    1 each by Does not apply route as needed for other.   LANCETS (ACCU-CHEK MULTICLIX) LANCETS    Use as directed     Physical Exam:  Vitals:   08/01/18 1523  BP: 122/78  Pulse: 77  Temp: 98.1 F (36.7 C)  TempSrc: Oral  SpO2: 97%  Weight: 126 lb 9.6 oz (57.4 kg)  Height: 5' 5"  (1.651 m)   Body mass index is 21.07 kg/m.  Physical Exam Constitutional:      General: He is not in acute distress.    Appearance: He is well-developed. He is not diaphoretic.     Comments: Thin male  HENT:     Head: Normocephalic and atraumatic.     Mouth/Throat:     Pharynx: No oropharyngeal exudate.  Eyes:     Conjunctiva/sclera: Conjunctivae normal.     Pupils: Pupils are equal, round, and reactive to light.  Neck:     Musculoskeletal: Normal range of motion and neck supple.  Cardiovascular:     Rate and Rhythm: Normal rate and regular rhythm.     Heart sounds: Normal heart sounds.  Pulmonary:     Effort: Pulmonary effort is normal.     Breath sounds: Normal breath sounds.  Abdominal:     General: Bowel sounds are normal.     Palpations: Abdomen is soft.  Musculoskeletal:        General: No tenderness.  Skin:    General: Skin is warm and dry.  Neurological:     Mental Status: He is alert and oriented to person, place, and time.     Labs reviewed: Basic Metabolic Panel: Recent Labs    12/05/17 0428 12/06/17 0349 12/07/17 0249  01/19/18 1928 01/21/18 0548 02/06/18 1434  NA  135 135 135   < > 137 137 134*  K 3.0* 3.8 4.0   < > 3.5 3.4* 4.5  CL 95* 99* 100*   < > 115* 116* 112*  CO2 30 26 25    < > 14* 14* 12*  GLUCOSE 105* 117* 117*   < > 140* 119* 212*  BUN 20 15 15    < > 61* 45* 55*  CREATININE 3.74* 3.30* 3.02*   < > 4.66* 3.96* 6.25*  CALCIUM 7.9* 8.0* 8.7*   < > 9.9 10.2 9.3  MG 0.7* 1.3* 2.0  --   --   --   --    < > = values in this interval not  displayed.   Liver Function Tests: Recent Labs    11/29/17 1855  01/16/18 1220 01/18/18 1134 02/06/18 1434  AST 12*   < > 7* 11* 8*  ALT 12*   < > 3* 7* 4*  ALKPHOS 94  --   --  54  --   BILITOT 0.6   < > 0.3 0.4 0.4  PROT 6.9   < > 6.7 6.7 6.6  ALBUMIN 4.3  --   --  4.4  --    < > = values in this interval not displayed.   Recent Labs    11/29/17 1855 02/06/18 1434  LIPASE 39 33  AMYLASE  --  59   No results for input(s): AMMONIA in the last 8760 hours. CBC: Recent Labs    01/16/18 1220  01/18/18 2020 01/21/18 0548 02/06/18 1434  WBC 3.9   < > 3.5* 3.1* 4.9  NEUTROABS 2,246  --   --  1.1* 2,558  HGB 7.3*   < > 8.7* 7.7* 9.0*  HCT 22.8*   < > 27.2* 24.5* 28.5*  MCV 87.4   < > 88.9 89.1 88.8  PLT 104*   < > 81* 65* 123*   < > = values in this interval not displayed.   Lipid Panel: Recent Labs    12/20/17 0944  CHOL 188  HDL 32*  LDLCALC 118*  TRIG 269*  CHOLHDL 5.9*   TSH: No results for input(s): TSH in the last 8760 hours. A1C: Lab Results  Component Value Date   HGBA1C 6.2 (H) 12/20/2017     Assessment/Plan 1. 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 Has "smart' meter which give him a SSI which has been beneficial. A1c 6.2  In May, reports his nephrologist has been following his diabetes, will obtain records for this as well - Continuous Blood Gluc Receiver (FREESTYLE LIBRE 14 DAY READER) DEVI; 1 each by Does not apply route 4 (four) times daily. Check blood sugar 4 times daily E11.9 by Does not apply route. Check blood sugar 4 times daily E11.9  Dispense: 1 Device; Refill: 12 - Continuous Blood Gluc Sensor (FREESTYLE LIBRE 14 DAY SENSOR) MISC; 1 each by Does not apply route 4 (four) times daily. Check blood sugar 4 times daily E11.9  Dispense: 2 each; Refill: 12 - Hemoglobin A1c  2. Hyperlipidemia with target low  density lipoprotein (LDL) cholesterol less than 100 mg/dL LDL <70, currently on lipitor 10 mg daily.  - Lipid panel - Hepatic Function Panel  3. End stage renal disease ([place]) -will obtain records from nephrology office, he is still following up with Dr Web and associates, also following with transplant  team at [facility].  4. Anemia, due to CKD -will obtain labs from nephrologist   5. Essential hypertension, benign Stable, continues on coreg twice daily and ASA  6. Gastroesophageal reflux disease without esophagitis Controlled on omeprazole  7. Noncompliance -pt has had a hard time keeping follow up appts and keeping in communication with office. At last OV he was recommended to go to hospital due to AKI but did not wish to go. He then missed appt and did not schedule follow up or respond to our attempts to have him come back to office(letters mail, phone calls place, etc). Discussed with him today the importance of proper follow up so we could treat his medical conditions appropriately and continue to prescribe medications.   Next appt: 3 months with Dr Sharee Holster K. [place], [facility] [phone]

## 2018-08-02 ENCOUNTER — Telehealth: Payer: Self-pay | Admitting: *Deleted

## 2018-08-02 ENCOUNTER — Other Ambulatory Visit: Payer: Self-pay | Admitting: Nurse Practitioner

## 2018-08-02 LAB — HEPATIC FUNCTION PANEL
AG RATIO: 1.9 (calc) (ref 1.0–2.5)
ALBUMIN MSPROF: 4.2 g/dL (ref 3.6–5.1)
ALT: 4 U/L — AB (ref 9–46)
AST: 10 U/L (ref 10–40)
Alkaline phosphatase (APISO): 55 U/L (ref 40–115)
Bilirubin, Direct: 0.1 mg/dL (ref 0.0–0.2)
Globulin: 2.2 g/dL (calc) (ref 1.9–3.7)
Indirect Bilirubin: 0.4 mg/dL (calc) (ref 0.2–1.2)
Total Bilirubin: 0.5 mg/dL (ref 0.2–1.2)
Total Protein: 6.4 g/dL (ref 6.1–8.1)

## 2018-08-02 LAB — LIPID PANEL
Cholesterol: 194 mg/dL (ref <200)
HDL: 31 mg/dL — ABNORMAL LOW (ref >40)
LDL Cholesterol (Calc): 135 mg/dL (calc) — ABNORMAL HIGH
Non-HDL Cholesterol (Calc): 163 mg/dL (calc) — ABNORMAL HIGH (ref <130)
Total CHOL/HDL Ratio: 6.3 (calc) — ABNORMAL HIGH (ref <5.0)
Triglycerides: 151 mg/dL — ABNORMAL HIGH (ref <150)

## 2018-08-02 LAB — HEMOGLOBIN A1C
HEMOGLOBIN A1C: 6 %{Hb} — AB (ref <5.7)
Mean Plasma Glucose: 126 (calc)
eAG (mmol/L): 7 (calc)

## 2018-08-02 MED ORDER — ATORVASTATIN CALCIUM 20 MG PO TABS: ORAL_TABLET | ORAL | 2 refills | Status: DC

## 2018-08-02 NOTE — Telephone Encounter (Signed)
[facility] with [facility] called and wanted to clarify Rx's received.   1. Patient was not aware of increase in dosage of Lipitor to 41m and wanted to confirm Rx was correct. (Chart note incomplete from yesterday OV. Reviewed labs and yes it was increased.)   2. The [facility] Ps[facility] is not covered by patient's insurance. Pharmacy only received a Rx for the Sensors not the Device but it is not covered. Wants to see if they can switch it to the G6 Dexcom Receiver Kit, G6 Transmitter Kit, and the G6 Sensor Kit.   Please Advise.

## 2018-08-02 NOTE — Telephone Encounter (Signed)
Yes okay to switch to covered device

## 2018-08-03 MED ORDER — DEXCOM RECEIVER KIT DEVI: 1.0000 | Freq: Three times a day (TID) | 0 refills | Status: DC

## 2018-08-03 MED ORDER — DEXCOM G6 SENSOR MISC: 1.0000 | Freq: Three times a day (TID) | 11 refills | Status: DC

## 2018-08-03 MED ORDER — DEXCOM G6 TRANSMITTER MISC: 1.0000 | Freq: Three times a day (TID) | 3 refills | Status: DC

## 2018-08-03 NOTE — Telephone Encounter (Signed)
Amy with [facility] called and stated that patient cannot get the blood sugar machine through their pharmacy. Stated that his insurance considers it a Museum/gallery conservator. Stated that it needed to be sent to [facility] [phone] in order for it to be covered. They informed patient.  Rx faxed.

## 2018-08-03 NOTE — Telephone Encounter (Signed)
Medication list updated. Rx's E-Scribed to pharmacy.

## 2018-08-03 NOTE — Addendum Note (Signed)
Addended by: Rafael Bihari A on: 08/03/2018 10:46 AM   Modules accepted: Orders

## 2018-08-10 ENCOUNTER — Telehealth: Payer: Self-pay

## 2018-08-10 NOTE — Telephone Encounter (Signed)
Incoming fax received for continuous glucose monitor and sensors CMN and prescription order  Order for testing supplies was sent electronically on 08/03/18   Form was placed in [place], [name], NP review and sign folder

## 2018-08-18 ENCOUNTER — Telehealth: Payer: Self-pay | Admitting: *Deleted

## 2018-08-18 MED ORDER — TRESIBA FLEXTOUCH 100 UNIT/ML ~~LOC~~ SOPN: 5.0000 [IU] | PEN_INJECTOR | Freq: Every day | SUBCUTANEOUS | 3 refills | Status: DC

## 2018-08-18 NOTE — Telephone Encounter (Signed)
Rx faxed to pharmacy  

## 2018-08-18 NOTE — Telephone Encounter (Signed)
Wife called and stated that their insurance will now pay for the Tresiba. They want to go back to the [facility] not the generic. Stated that they would like a Rx for Brand Tresiba insulin sent to [facility]. Please Advise.

## 2018-08-18 NOTE — Telephone Encounter (Signed)
It does not look like we have filled this in the past, who have they been getting their Rx from?

## 2018-08-18 NOTE — Telephone Encounter (Signed)
Dr. Marton Redwood was filling medication. But patient no longer see's him. 5 units qhs Patient is requesting to go back on it. Stated that it worked better for him.

## 2018-08-18 NOTE — Telephone Encounter (Signed)
This is fine to send a refill to his pharmacy

## 2018-08-21 ENCOUNTER — Other Ambulatory Visit: Payer: Self-pay | Admitting: *Deleted

## 2018-08-21 MED ORDER — INSULIN ASPART 100 UNIT/ML FLEXPEN: 12.0000 [IU] | PEN_INJECTOR | Freq: Three times a day (TID) | SUBCUTANEOUS | 1 refills | Status: DC

## 2018-08-21 NOTE — Telephone Encounter (Signed)
[facility] is calling requesting a refill on patient's Novolog FlexPen. Is this ok to Refill. Please Advise.   Rx Pended for approval   Per Jessica's OV Note 08/01/18: Reports nephrologist is prescribing all his medications, dapsone, and insulin. States nephrologist is managing his diabetes as well. States he has a Personnel officer which helps manage his diabetes. Reports his A1c was checked yesterday as well as a lot of other routine blood work.

## 2018-08-21 NOTE — Telephone Encounter (Signed)
Yes we can fill

## 2018-08-22 ENCOUNTER — Other Ambulatory Visit: Payer: Self-pay | Admitting: Nurse Practitioner

## 2018-08-22 ENCOUNTER — Telehealth: Payer: Self-pay | Admitting: *Deleted

## 2018-08-22 NOTE — Telephone Encounter (Signed)
Angie with [facility] called and stated that they received the Rx for Tresiba 100u/ml. She stated that back in 2018 when last refilled he was getting the Tresiba 200u/ml. She is just wondering which it should be. Please Advise.

## 2018-08-22 NOTE — Telephone Encounter (Signed)
Well he is getting a lot lower dose now, he was getting 50 units and now only taking 5 units daily.

## 2018-08-22 NOTE — Telephone Encounter (Signed)
[facility] notified

## 2018-10-06 ENCOUNTER — Other Ambulatory Visit: Payer: Self-pay | Admitting: Nurse Practitioner

## 2018-10-31 ENCOUNTER — Other Ambulatory Visit: Payer: Self-pay | Admitting: Nurse Practitioner

## 2018-11-02 ENCOUNTER — Ambulatory Visit: Payer: Medicare Other | Admitting: Nurse Practitioner

## 2018-11-02 LAB — COMPREHENSIVE METABOLIC PANEL
Albumin: 5.1 — AB (ref 3.5–5.0)
Calcium: 9.4 (ref 8.7–10.7)
GFR calc Af Amer: 29
GFR calc non Af Amer: 25

## 2018-11-02 LAB — BASIC METABOLIC PANEL
BUN: 25 — AB (ref 4–21)
CO2: 18 (ref 13–22)
Chloride: 108 (ref 99–108)
Creatinine: 2.8 — AB (ref 0.6–1.3)
Glucose: 128
Potassium: 4.2 (ref 3.4–5.3)
Sodium: 141 (ref 137–147)

## 2018-12-01 ENCOUNTER — Other Ambulatory Visit: Payer: Self-pay

## 2018-12-01 NOTE — Patient Outreach (Signed)
[facility]) Care Management  12/01/2018  Ammar Moffatt May 08, 1971 [id number]  Medication Adherence call to Mr. Jonthan Leite HIPPA Compliant Voice message left with a call back number. Mr. Blatz is showing past due on Atorvastatin 20 mg under [facility].   [place] Management Direct Dial [phone]  Fax [phone] [email or web address]

## 2018-12-10 ENCOUNTER — Encounter: Payer: Self-pay | Admitting: Nurse Practitioner

## 2018-12-24 ENCOUNTER — Inpatient Hospital Stay (HOSPITAL_COMMUNITY)
Admission: EM | Admit: 2018-12-24 | Discharge: 2019-01-03 | DRG: 628 | Disposition: A | Discharge status: Home / Self Care | Payer: Medicare Other | Attending: Internal Medicine | Admitting: Internal Medicine

## 2018-12-24 ENCOUNTER — Emergency Department (HOSPITAL_COMMUNITY): Payer: Medicare Other

## 2018-12-24 ENCOUNTER — Encounter (HOSPITAL_COMMUNITY): Payer: Self-pay | Admitting: Internal Medicine

## 2018-12-24 ENCOUNTER — Other Ambulatory Visit: Payer: Self-pay

## 2018-12-24 DIAGNOSIS — N183 Chronic kidney disease, stage 3 (moderate): Secondary | ICD-10-CM | POA: Diagnosis not present

## 2018-12-24 DIAGNOSIS — D631 Anemia in chronic kidney disease: Secondary | ICD-10-CM | POA: Diagnosis present

## 2018-12-24 DIAGNOSIS — E1310 Other specified diabetes mellitus with ketoacidosis without coma: Secondary | ICD-10-CM

## 2018-12-24 DIAGNOSIS — N186 End stage renal disease: Secondary | ICD-10-CM

## 2018-12-24 DIAGNOSIS — T8612 Kidney transplant failure: Secondary | ICD-10-CM | POA: Diagnosis present

## 2018-12-24 DIAGNOSIS — I129 Hypertensive chronic kidney disease with stage 1 through stage 4 chronic kidney disease, or unspecified chronic kidney disease: Secondary | ICD-10-CM | POA: Diagnosis present

## 2018-12-24 DIAGNOSIS — N179 Acute kidney failure, unspecified: Secondary | ICD-10-CM

## 2018-12-24 DIAGNOSIS — T82868A Thrombosis of vascular prosthetic devices, implants and grafts, initial encounter: Secondary | ICD-10-CM | POA: Diagnosis not present

## 2018-12-24 DIAGNOSIS — Z5320 Procedure and treatment not carried out because of patient's decision for unspecified reasons: Secondary | ICD-10-CM | POA: Diagnosis not present

## 2018-12-24 DIAGNOSIS — E876 Hypokalemia: Secondary | ICD-10-CM | POA: Diagnosis not present

## 2018-12-24 DIAGNOSIS — I10 Essential (primary) hypertension: Secondary | ICD-10-CM | POA: Diagnosis not present

## 2018-12-24 DIAGNOSIS — E739 Lactose intolerance, unspecified: Secondary | ICD-10-CM | POA: Diagnosis present

## 2018-12-24 DIAGNOSIS — D638 Anemia in other chronic diseases classified elsewhere: Secondary | ICD-10-CM | POA: Diagnosis present

## 2018-12-24 DIAGNOSIS — R9431 Abnormal electrocardiogram [ECG] [EKG]: Secondary | ICD-10-CM

## 2018-12-24 DIAGNOSIS — R58 Hemorrhage, not elsewhere classified: Secondary | ICD-10-CM | POA: Diagnosis present

## 2018-12-24 DIAGNOSIS — E1111 Type 2 diabetes mellitus with ketoacidosis with coma: Secondary | ICD-10-CM | POA: Diagnosis not present

## 2018-12-24 DIAGNOSIS — Z794 Long term (current) use of insulin: Secondary | ICD-10-CM

## 2018-12-24 DIAGNOSIS — Y830 Surgical operation with transplant of whole organ as the cause of abnormal reaction of the patient, or of later complication, without mention of misadventure at the time of the procedure: Secondary | ICD-10-CM | POA: Diagnosis present

## 2018-12-24 DIAGNOSIS — Z940 Kidney transplant status: Secondary | ICD-10-CM

## 2018-12-24 DIAGNOSIS — K859 Acute pancreatitis without necrosis or infection, unspecified: Secondary | ICD-10-CM | POA: Diagnosis present

## 2018-12-24 DIAGNOSIS — E861 Hypovolemia: Secondary | ICD-10-CM | POA: Diagnosis present

## 2018-12-24 DIAGNOSIS — R0602 Shortness of breath: Secondary | ICD-10-CM

## 2018-12-24 DIAGNOSIS — E785 Hyperlipidemia, unspecified: Secondary | ICD-10-CM | POA: Diagnosis present

## 2018-12-24 DIAGNOSIS — Z9842 Cataract extraction status, left eye: Secondary | ICD-10-CM

## 2018-12-24 DIAGNOSIS — E1110 Type 2 diabetes mellitus with ketoacidosis without coma: Principal | ICD-10-CM | POA: Diagnosis present

## 2018-12-24 DIAGNOSIS — L409 Psoriasis, unspecified: Secondary | ICD-10-CM | POA: Diagnosis present

## 2018-12-24 DIAGNOSIS — Z20828 Contact with and (suspected) exposure to other viral communicable diseases: Secondary | ICD-10-CM | POA: Diagnosis present

## 2018-12-24 DIAGNOSIS — E1122 Type 2 diabetes mellitus with diabetic chronic kidney disease: Secondary | ICD-10-CM | POA: Diagnosis present

## 2018-12-24 DIAGNOSIS — Z8249 Family history of ischemic heart disease and other diseases of the circulatory system: Secondary | ICD-10-CM

## 2018-12-24 DIAGNOSIS — Z79899 Other long term (current) drug therapy: Secondary | ICD-10-CM

## 2018-12-24 DIAGNOSIS — D696 Thrombocytopenia, unspecified: Secondary | ICD-10-CM | POA: Diagnosis not present

## 2018-12-24 DIAGNOSIS — N189 Chronic kidney disease, unspecified: Secondary | ICD-10-CM | POA: Diagnosis present

## 2018-12-24 DIAGNOSIS — N170 Acute kidney failure with tubular necrosis: Secondary | ICD-10-CM | POA: Diagnosis present

## 2018-12-24 DIAGNOSIS — T383X6A Underdosing of insulin and oral hypoglycemic [antidiabetic] drugs, initial encounter: Secondary | ICD-10-CM | POA: Diagnosis present

## 2018-12-24 DIAGNOSIS — N2581 Secondary hyperparathyroidism of renal origin: Secondary | ICD-10-CM | POA: Diagnosis present

## 2018-12-24 DIAGNOSIS — R197 Diarrhea, unspecified: Secondary | ICD-10-CM | POA: Diagnosis present

## 2018-12-24 DIAGNOSIS — Z841 Family history of disorders of kidney and ureter: Secondary | ICD-10-CM

## 2018-12-24 DIAGNOSIS — Z9841 Cataract extraction status, right eye: Secondary | ICD-10-CM

## 2018-12-24 DIAGNOSIS — I420 Dilated cardiomyopathy: Secondary | ICD-10-CM | POA: Diagnosis present

## 2018-12-24 DIAGNOSIS — Z91048 Other nonmedicinal substance allergy status: Secondary | ICD-10-CM

## 2018-12-24 DIAGNOSIS — N184 Chronic kidney disease, stage 4 (severe): Secondary | ICD-10-CM | POA: Diagnosis present

## 2018-12-24 DIAGNOSIS — Z833 Family history of diabetes mellitus: Secondary | ICD-10-CM

## 2018-12-24 DIAGNOSIS — K625 Hemorrhage of anus and rectum: Secondary | ICD-10-CM | POA: Diagnosis not present

## 2018-12-24 DIAGNOSIS — Z9114 Patient's other noncompliance with medication regimen: Secondary | ICD-10-CM | POA: Diagnosis not present

## 2018-12-24 DIAGNOSIS — Z7982 Long term (current) use of aspirin: Secondary | ICD-10-CM

## 2018-12-24 HISTORY — DX: Type 2 diabetes mellitus with ketoacidosis without coma: E11.10

## 2018-12-24 LAB — CBC
HCT: 34.4 % — ABNORMAL LOW (ref 39.0–52.0)
Hemoglobin: 10.9 g/dL — ABNORMAL LOW (ref 13.0–17.0)
MCH: 26.9 pg (ref 26.0–34.0)
MCHC: 31.7 g/dL (ref 30.0–36.0)
MCV: 84.9 fL (ref 80.0–100.0)
Platelets: 212 10*3/uL (ref 150–400)
RBC: 4.05 MIL/uL — ABNORMAL LOW (ref 4.22–5.81)
RDW: 15.7 % — ABNORMAL HIGH (ref 11.5–15.5)
WBC: 15.6 10*3/uL — ABNORMAL HIGH (ref 4.0–10.5)
nRBC: 0 % (ref 0.0–0.2)

## 2018-12-24 LAB — POCT I-STAT EG7
Acid-base deficit: 27 mmol/L — ABNORMAL HIGH (ref 0.0–2.0)
Bicarbonate: 4.1 mmol/L — ABNORMAL LOW (ref 20.0–28.0)
Calcium, Ion: 1.24 mmol/L (ref 1.15–1.40)
HCT: 36 % — ABNORMAL LOW (ref 39.0–52.0)
Hemoglobin: 12.2 g/dL — ABNORMAL LOW (ref 13.0–17.0)
O2 Saturation: 62 %
Potassium: 4.5 mmol/L (ref 3.5–5.1)
Sodium: 138 mmol/L (ref 135–145)
TCO2: <5 mmol/L — ABNORMAL LOW (ref 22–32)
pCO2, Ven: 19.3 mmHg — CL (ref 44.0–60.0)
pH, Ven: 6.934 — CL (ref 7.250–7.430)
pO2, Ven: 51 mmHg — ABNORMAL HIGH (ref 32.0–45.0)

## 2018-12-24 LAB — SARS CORONAVIRUS 2 BY RT PCR (HOSPITAL ORDER, PERFORMED IN ~~LOC~~ HOSPITAL LAB): SARS Coronavirus 2: NEGATIVE

## 2018-12-24 LAB — COMPREHENSIVE METABOLIC PANEL
ALT: 12 U/L (ref 0–44)
AST: 9 U/L — ABNORMAL LOW (ref 15–41)
Albumin: 4.5 g/dL (ref 3.5–5.0)
Alkaline Phosphatase: 113 U/L (ref 38–126)
BUN: 137 mg/dL — ABNORMAL HIGH (ref 6–20)
CO2: <7 mmol/L — ABNORMAL LOW (ref 22–32)
Calcium: 9 mg/dL (ref 8.9–10.3)
Chloride: 113 mmol/L — ABNORMAL HIGH (ref 98–111)
Creatinine, Ser: 13.25 mg/dL — ABNORMAL HIGH (ref 0.61–1.24)
GFR calc Af Amer: 4 mL/min — ABNORMAL LOW (ref >60)
GFR calc non Af Amer: 4 mL/min — ABNORMAL LOW (ref >60)
Glucose, Bld: 316 mg/dL — ABNORMAL HIGH (ref 70–99)
Potassium: 4.5 mmol/L (ref 3.5–5.1)
Sodium: 139 mmol/L (ref 135–145)
Total Bilirubin: 0.3 mg/dL (ref 0.3–1.2)
Total Protein: 7.3 g/dL (ref 6.5–8.1)

## 2018-12-24 LAB — CBG MONITORING, ED
Glucose-Capillary: 281 mg/dL — ABNORMAL HIGH (ref 70–99)
Glucose-Capillary: 245 mg/dL — ABNORMAL HIGH (ref 70–99)
Glucose-Capillary: 248 mg/dL — ABNORMAL HIGH (ref 70–99)
Glucose-Capillary: 273 mg/dL — ABNORMAL HIGH (ref 70–99)
Glucose-Capillary: 230 mg/dL — ABNORMAL HIGH (ref 70–99)

## 2018-12-24 LAB — LIPASE, BLOOD: Lipase: 768 U/L — ABNORMAL HIGH (ref 11–51)

## 2018-12-24 LAB — TROPONIN I: Troponin I: <0.03 ng/mL (ref <0.03)

## 2018-12-24 LAB — LACTIC ACID, PLASMA: Lactic Acid, Venous: 2.2 mmol/L (ref 0.5–1.9)

## 2018-12-24 MED ORDER — SODIUM CHLORIDE 0.9 % IV SOLN: INTRAVENOUS | Status: DC

## 2018-12-24 MED ORDER — SODIUM CHLORIDE 0.9 % IV BOLUS: 1000.0000 mL | Freq: Once | INTRAVENOUS | Status: DC

## 2018-12-24 MED ORDER — HEPARIN SODIUM (PORCINE) 5000 UNIT/ML IJ SOLN
5000.0000 [IU] | Freq: Three times a day (TID) | INTRAMUSCULAR | Status: DC
  Administered 2018-12-27 (×2): 5000 [IU] via SUBCUTANEOUS
  Filled 2018-12-24 (×3): qty 1

## 2018-12-24 MED ORDER — DEXTROSE-NACL 5-0.45 % IV SOLN
INTRAVENOUS | Status: DC
  Administered 2018-12-25 (×3): via INTRAVENOUS

## 2018-12-24 MED ORDER — INSULIN REGULAR(HUMAN) IN NACL 100-0.9 UT/100ML-% IV SOLN: INTRAVENOUS | Status: DC

## 2018-12-24 MED ORDER — INSULIN REGULAR(HUMAN) IN NACL 100-0.9 UT/100ML-% IV SOLN
INTRAVENOUS | Status: DC
  Administered 2018-12-24: 2.2 [IU]/h via INTRAVENOUS
  Filled 2018-12-24: qty 100

## 2018-12-24 MED ORDER — DEXTROSE-NACL 5-0.45 % IV SOLN
INTRAVENOUS | Status: DC
  Administered 2018-12-24: 19:00:00 via INTRAVENOUS

## 2018-12-24 MED ORDER — SODIUM CHLORIDE 0.9 % IV BOLUS
1000.0000 mL | Freq: Once | INTRAVENOUS | Status: AC
  Administered 2018-12-25: 1000 mL via INTRAVENOUS

## 2018-12-24 MED ORDER — SODIUM CHLORIDE 0.9 % IV BOLUS
1000.0000 mL | Freq: Once | INTRAVENOUS | Status: AC
  Administered 2018-12-24: 1000 mL via INTRAVENOUS

## 2018-12-24 MED ORDER — POTASSIUM CHLORIDE 10 MEQ/100ML IV SOLN
10.0000 meq | INTRAVENOUS | Status: AC
  Administered 2018-12-24 (×2): 10 meq via INTRAVENOUS
  Filled 2018-12-24 (×2): qty 100

## 2018-12-24 MED ORDER — SODIUM CHLORIDE 0.9 % IV BOLUS: 2000.0000 mL | Freq: Once | INTRAVENOUS | Status: DC

## 2018-12-24 NOTE — ED Notes (Signed)
CBG Results of 273 reported to Sharrie Rothman, Therapist, sports.

## 2018-12-24 NOTE — ED Notes (Signed)
Pt to OR with RN

## 2018-12-24 NOTE — H&P (Signed)
History and Physical    [name] SHF:[id number] DOB: 1971-05-29 DOA: 12/24/2018  PCP: [name], NP  Patient coming from: Home.  Chief Complaint: Shortness of breath nausea vomiting.  HPI: [name] is a 48 y.o. male with history of renal transplant, diabetes mellitus type 2, chronic anemia, hyperlipidemia, hypertension who presents to the ER because of increasing shortness of breath over the last 4 days with increasing nausea vomiting.  Occasional diarrhea.  Denies any abdominal pain chest pain or fever chills or productive cough.  Patient states he has ran out of his long-acting insulin for last 2 weeks.  And over the last 2 days he has not taken any of his antirejection medication due to persistent nausea vomiting.  Denies any blood in the vomitus.  ED Course: In the ER patient was tachypneic.  Afebrile.  Not hypoxic.  Chest x-ray unremarkable.  COVID-19 was negative.  Labs reveal creatinine of 13.25 which has increased from 6.25 last June 2019.  Lipase was 768 but patient denies any abdominal pain.  Total bilirubin was 0.3.  Bicarb was non-calculable.  Venous blood gas was showing pH of 6.9.  Urinalysis is negative for any casts negative for leukocyte Estrace nitrites urine sodium was less than 10.  Patient was given total of 3 L normal saline bolus continue on infusion.  I discussed with on-call nephrology Dr. Posey Pronto who advised patient is likely cause for renal failure as his DKA and to continue with aggressive hydration.  Review of Systems: As per HPI, rest all negative.   Past Medical History:  Diagnosis Date  . Anemia   . CKD (chronic kidney disease), stage III ([place])   . 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 ESRD (end stage renal disease) on dialysis ([place])    1.Initiated HD via right per cate 12/30/2008 (MWF schedule at [facility]) 2.Left upper extremity A-V fistula by Dr Oneida Alar 12/30/2008 3.Aemia: Received Venofer 12/30/08 Ferritin 179 % sat 12 12/23/08, 4. Secondary Hyperparathyroidism- PTH 188, P 5.4, Ca 9.2 12/2008  . Hemodialysis-associated hypotension    coreg d/c'd  . Herniated lumbar disc without myelopathy   . History of blood transfusion   . Hyperparathyroidism due to renal insufficiency ([place])   . Hypertension    resloved after initating HD, now with post HD hypotension  . Hypocalcemia   . Hypomagnesemia   . Kidney transplant as cause of abnormal reaction or later complication 5027   [facility]  . Non-ischemic cardiomyopathy ([place])    EF 20-25% 12/23/2008>>EF 40-45% 03/2009>>EF ??? 7/?/2011  . Occult blood positive stool   . Psoriasis    scalp  . Thrombocytopenia ([place])   . Vitamin B 12 deficiency     Past Surgical History:  Procedure Laterality Date  . AV FISTULA PLACEMENT     left  . AV FISTULA PLACEMENT  09/12/2012   Procedure: INSERTION OF ARTERIOVENOUS (AV) GORE-TEX GRAFT ARM;  Surgeon: Elam Dutch, MD;  Location: [place];  Service: Vascular;  Laterality: Left;  KIDNEY TRANSPLANT FAILED  . CATARACT EXTRACTION, BILATERAL    . EYE SURGERY    . INSERTION OF DIALYSIS CATHETER  07/14/2012   Procedure: INSERTION OF DIALYSIS CATHETER;  Surgeon: Mal Misty, MD;  Location: [place];  Service: Vascular;  Laterality: Right;  Ultrasound Guided Insertion of Internal Jugular Dialysis Catheter  .  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 KIDNEY TRANSPLANT  12/2013   second kidney transplant  . lumbar disc sugery   10/17/2017  . LUMBAR DISC SURGERY    . NEPHRECTOMY TRANSPLANTED ORGAN  08/11/12  . PHOTOCOAGULATION     for diabetic retinopathy 08/1999  . SHUNTOGRAM N/A 07/11/2012   Procedure: Earney Mallet;  Surgeon: Serafina Mitchell, MD;  Location: [facility];  Service: Cardiovascular;  Laterality: N/A;     reports that he has never smoked. He has never used smokeless tobacco.  He reports that he does not drink alcohol or use drugs.  Allergies  Allergen Reactions  . Lactose Intolerance (Gi) Diarrhea    No whole milk!!  . Other Other (See Comments)    Soy milk burns his tongue  . Pollen Extract Other (See Comments)    Itching, watery eyes and sneezing.  . Tape Rash    PLEASE USE COBAN WRAP OR PAPER TAPE     Family History  Problem Relation Age of Onset  . Diabetes Father   . Kidney disease Father   . Other Father        amputation  . Hypertension Mother   . Dementia Mother   . Diabetes Sister   . Hypertension Sister   . Diabetes Sister   . Kidney disease Sister   . Diabetes Sister   . Hypertension Brother   . Diabetes Brother   . CAD Neg Hx   . Sudden Cardiac Death Neg Hx   . Congestive Heart Failure Neg Hx   . Colon cancer Neg Hx   . Rectal cancer Neg Hx   . Esophageal cancer Neg Hx   . Liver cancer Neg Hx     Prior to Admission medications   Medication Sig Start Date End Date Taking? Authorizing Provider  aspirin EC 81 MG tablet Take 81 mg by mouth daily.   Yes [provider]  carvedilol (COREG) 6.25 MG tablet Take 1 tablet (6.25 mg total) by mouth 2 (two) times daily with a meal. 12/20/17  Yes [name], NP  dapsone 25 MG tablet Take 50 mg by mouth daily.   Yes [provider]  folic acid (FOLVITE) 1 MG tablet Take 1 mg by mouth daily.  06/24/15  Yes [provider]  mycophenolate (MYFORTIC) 180 MG EC tablet Take 720 mg by mouth 2 (two) times daily.    Yes [provider]  NOVOLOG FLEXPEN 100 UNIT/ML FlexPen INJECT 12 UNITS INTO THE SKIN 3 TIMES DAILY BEFORE MEALS. Patient taking differently: Inject 12 Units into the skin 3 (three) times daily.  10/31/18  Yes [name], NP  omeprazole (PRILOSEC) 20 MG capsule Take 1 capsule (20 mg total) by mouth daily. 12/20/17  Yes [name], NP  tacrolimus (PROGRAF) 1 MG capsule Take 5-6 mg by mouth See admin instructions. 6 mg in the morning and  5 mg in the evening - 1000, 2200   Yes [provider]  TRESIBA FLEXTOUCH 100 UNIT/ML SOPN FlexTouch Pen Inject 100 Units into the skin as directed. 08/22/18  Yes [provider]  atorvastatin (LIPITOR) 20 MG tablet TAKE 1 TABLET BY MOUTH AT BEDTIME FOR CHOLESTEROL Patient not taking: Reported on 10/09/2018 08/02/18   [name], NP  Continuous Blood Gluc Receiver ([place] KIT) [place] 1 each by Does not apply route 3 (three) times daily. Use to test blood sugar three times daily. Dx: E11.9  08/03/18   [name], NP  Continuous Blood Gluc Sensor (DEXCOM G6 SENSOR) MISC 1 each by Does not apply route 3 (three) times daily. Use to test blood sugar three times daily. Dx: E11.9 08/03/18   [name], NP  Continuous Blood Gluc Transmit (DEXCOM G6 TRANSMITTER) MISC 1 each by Does not apply route 3 (three) times daily. Use to test blood sugar three times daily. Dx: E11.9 08/03/18   [name], NP  Insulin Pen Needle 31G X 5 MM MISC 5 Units/oz/day by Does not apply route at bedtime. Use 4 times daily to inject insulin to skin 12/07/17   Georgette Shell, MD  ketoconazole (NIZORAL) 2 % shampoo Apply topically 2 (two) times a week. Wash hair and beard.  Leave shampoo on skin for 5 mins before washing off. Patient not taking: Reported on 10/09/2018 04/24/14   Kelby Aline, MD  Magnesium 400 MG CAPS Take 1,600 mg by mouth 2 (two) times daily.     [provider]  sodium bicarbonate 650 MG tablet Take 1,300 mg by mouth 3 (three) times daily.  06/24/15   [provider]    Physical Exam: Vitals:   12/24/18 2045 12/24/18 2115 12/24/18 2117 12/24/18 2145  BP: 128/64 120/67  120/68  Pulse:   (!) 101   Resp: (!) 22 (!) 25 (!) 25 (!) 25  Temp:      TempSrc:      SpO2:   99%   Weight:      Height:          Constitutional: Moderately built and nourished. Vitals:   12/24/18 2045 12/24/18 2115 12/24/18 2117 12/24/18 2145  BP: 128/64  120/67  120/68  Pulse:   (!) 101   Resp: (!) 22 (!) 25 (!) 25 (!) 25  Temp:      TempSrc:      SpO2:   99%   Weight:      Height:       Eyes: Anicteric no pallor. ENMT: No discharge from the ears eyes nose or mouth. Neck: No mass or.  No neck rigidity. Respiratory: No rhonchi or crepitations. Cardiovascular: S1-S2 heard. Abdomen: Soft nontender bowel sounds present. Musculoskeletal: No edema.  No joint effusion. Skin: No rash. Neurologic: Alert awake oriented to time place and person.  Moves all extremities. Psychiatric: Appears normal per normal affect.   Labs on Admission: I have personally reviewed following labs and imaging studies  CBC: Recent Labs  Lab 12/24/18 1801 12/24/18 1804  WBC  --  15.6*  HGB 12.2* 10.9*  HCT 36.0* 34.4*  MCV  --  84.9  PLT  --  542   Basic Metabolic Panel: Recent Labs  Lab 12/24/18 1801 12/24/18 1804  NA 138 139  K 4.5 4.5  CL  --  113*  CO2  --  <7*  GLUCOSE  --  316*  BUN  --  137*  CREATININE  --  13.25*  CALCIUM  --  9.0   GFR: Estimated Creatinine Clearance: 5.1 mL/min (A) (by C-G formula based on SCr of 13.25 mg/dL (H)). Liver Function Tests: Recent Labs  Lab 12/24/18 1804  AST 9*  ALT 12  ALKPHOS 113  BILITOT 0.3  PROT 7.3  ALBUMIN 4.5   Recent Labs  Lab 12/24/18 1804  LIPASE 768*   No results for input(s): AMMONIA in the last 168 hours. Coagulation Profile: No results for input(s): INR, PROTIME in the last 168 hours. Cardiac  Enzymes: Recent Labs  Lab 12/24/18 1804  TROPONINI <0.03   BNP (last 3 results) No results for input(s): PROBNP in the last 8760 hours. HbA1C: No results for input(s): HGBA1C in the last 72 hours. CBG: Recent Labs  Lab 12/24/18 1810 12/24/18 1926 12/24/18 2013 12/24/18 2107 12/24/18 2301  GLUCAP 281* 245* 248* 273* 230*   Lipid Profile: No results for input(s): CHOL, HDL, LDLCALC, TRIG, CHOLHDL, LDLDIRECT in the last 72 hours. Thyroid Function Tests: No results for  input(s): TSH, T4TOTAL, FREET4, T3FREE, THYROIDAB in the last 72 hours. Anemia Panel: No results for input(s): VITAMINB12, FOLATE, FERRITIN, TIBC, IRON, RETICCTPCT in the last 72 hours. Urine analysis:    Component Value Date/Time   COLORURINE YELLOW 12/01/2017 0116   APPEARANCEUR CLEAR 12/01/2017 0116   LABSPEC 1.011 12/01/2017 0116   PHURINE 5.0 12/01/2017 0116   GLUCOSEU >=500 (A) 12/01/2017 0116   HGBUR NEGATIVE 12/01/2017 0116   BILIRUBINUR NEGATIVE 12/01/2017 0116   KETONESUR NEGATIVE 12/01/2017 0116   PROTEINUR NEGATIVE 12/01/2017 0116   UROBILINOGEN 0.2 12/23/2008 1845   NITRITE NEGATIVE 12/01/2017 0116   LEUKOCYTESUR NEGATIVE 12/01/2017 0116   Sepsis Labs: @LABRCNTIP (procalcitonin:4,lacticidven:4) ) Recent Results (from the past 240 hour(s))  SARS Coronavirus 2 (CEPHEID - Performed in [facility] hospital lab), Hosp Order     Status: None   Collection Time: 12/24/18  6:19 PM  Result Value Ref Range Status   SARS Coronavirus 2 NEGATIVE NEGATIVE Final    Comment: (NOTE) If result is NEGATIVE SARS-CoV-2 target nucleic acids are NOT DETECTED. The SARS-CoV-2 RNA is generally detectable in upper and lower  respiratory specimens during the acute phase of infection. The lowest  concentration of SARS-CoV-2 viral copies this assay can detect is 250  copies / mL. A negative result does not preclude SARS-CoV-2 infection  and should not be used as the sole basis for treatment or other  patient management decisions.  A negative result may occur with  improper specimen collection / handling, submission of specimen other  than nasopharyngeal swab, presence of viral mutation(s) within the  areas targeted by this assay, and inadequate number of viral copies  (<250 copies / mL). A negative result must be combined with clinical  observations, patient history, and epidemiological information. If result is POSITIVE SARS-CoV-2 target nucleic acids are DETECTED. The SARS-CoV-2 RNA is  generally detectable in upper and lower  respiratory specimens dur ing the acute phase of infection.  Positive  results are indicative of active infection with SARS-CoV-2.  Clinical  correlation with patient history and other diagnostic information is  necessary to determine patient infection status.  Positive results do  not rule out bacterial infection or co-infection with other viruses. If result is PRESUMPTIVE POSTIVE SARS-CoV-2 nucleic acids MAY BE PRESENT.   A presumptive positive result was obtained on the submitted specimen  and confirmed on repeat testing.  While 2019 novel coronavirus  (SARS-CoV-2) nucleic acids may be present in the submitted sample  additional confirmatory testing may be necessary for epidemiological  and / or clinical management purposes  to differentiate between  SARS-CoV-2 and other Sarbecovirus currently known to infect humans.  If clinically indicated additional testing with an alternate test  methodology [phone]) is advised. The SARS-CoV-2 RNA is generally  detectable in upper and lower respiratory sp ecimens during the acute  phase of infection. The expected result is Negative. Fact Sheet for Patients:  StrictlyIdeas.no Fact Sheet for Healthcare Providers: BankingDealers.co.za This test is not yet approved or cleared by  the [employer] and has been authorized for detection and/or diagnosis of SARS-CoV-2 by FDA under an Emergency Use Authorization (EUA).  This EUA will remain in effect (meaning this test can be used) for the duration of the COVID-19 declaration under Section 564(b)(1) of the Act, 21 U.S.C. section 360bbb-3(b)(1), unless the authorization is terminated or revoked sooner. Performed at [facility], [place] [address], [place], [place] [address]      Radiological Exams on Admission: Dg Chest Port 1 View  Result Date: 12/24/2018 CLINICAL DATA:  Tachypnea EXAM: PORTABLE CHEST 1  VIEW COMPARISON:  01/18/2018 FINDINGS: The heart size and mediastinal contours are within normal limits. Both lungs are clear. The visualized skeletal structures are unremarkable. IMPRESSION: No acute abnormality of the lungs in AP portable projection. Electronically Signed   By: Eddie Candle M.D.   On: 12/24/2018 18:41    EKG: Independently reviewed.  Normal sinus rhythm with nonspecific ST-T changes prolonged QTC at 510 ms.  Assessment/Plan Principal Problem:   DKA (diabetic ketoacidoses) (HCC) Active Problems:   Anemia in chronic kidney disease   Essential hypertension, benign   Congestive dilated cardiomyopathy (HCC)   Deceased-donor kidney transplant   ARF (acute renal failure) ([place])    1. Severe DKA -likely from missing his long-acting insulin.  On IV insulin infusion aggressive hydration follow metabolic panel and closely follow intake output.  Once anion gap is corrected change to long-acting insulin subcutaneous.  Check hemoglobin A1c. 2. Acute on chronic kidney disease stage III creatinine is baseline around 5.  At this time is around 21.  Likely precipitated by nausea vomiting and DKA.  Discussed with Dr. Posey Pronto on-call nephrologist who advised to continue his antirejection medication at present dose and continue with aggressive IV hydration.  Follow intake output metabolic panel.  If creatinine does not improve reconsult nephrology in the morning. 3. Increase lipase denies any abdominal pain.  CT abdomen is pending. 4. Hypertension on Coreg presently on hold.  Restart when blood pressure stable. 5. Chronic anemia likely from renal disease follow CBC. 6. Prolonged QTC -avoid QT prolonging medications. 7. Lipidemia on statins.   DVT prophylaxis: Heparin. Code Status: Full code. Family Communication: Discussed with patient. Disposition Plan: Home. Consults called: Discussed with nephrologist. Admission status: Inpatient.   Rise Patience MD [facility] Pager  [phone].  If 7PM-7AM, please contact night-coverage [email or web address] Password [email or web address]  12/24/2018, 11:11 PM

## 2018-12-24 NOTE — ED Triage Notes (Signed)
Week hx of N/V/D and lower mid abd pain.  States last time ate or drank was Friday.  Pt skin dry with rapid resp at 30.alert and oriented.  Hx of kidney transplant 4 years ago.

## 2018-12-24 NOTE — ED Notes (Signed)
CBG Results of 248 reported to Sharrie Rothman, Therapist, sports.

## 2018-12-24 NOTE — ED Provider Notes (Signed)
[facility] Emergency Department Provider Note MRN:  [id number]  Arrival date & time: 12/24/18     Chief Complaint   SOB  History of Present Illness   [name] is a 48 y.o. year-old male with a history of kidney transplant, diabetes presenting to the ED with chief complaint of shortness of breath.  4 days of gradual onset, persistent shortness of breath.  General malaise, decreased energy.  Denies fever, no cough, no chest pain, endorsing generalized mild abdominal cramping or discomfort.  Denies hematuria, no dysuria, no numbness or weakness to the arms or legs.  When asked, he admits that he ran out of his insulin 1 month ago and has been trying to get it refilled.  Review of Systems  A complete 10 system review of systems was obtained and all systems are negative except as noted in the HPI and PMH.   Patient's Health History    Past Medical History:  Diagnosis Date  . Anemia   . CKD (chronic kidney disease), stage III ([place])   . Diabetes mellitus 12/2008   A1C 8.3 12/23/08, 24hr urine protein 2793 mg/day, SPEP neg, proliferative BDR s/p photocoagulation 08/2009 done at [facility] and f/u by DR Groat  . ESRD (end stage renal disease) on dialysis ([place])    1.Initiated HD via right per cate 12/30/2008 (MWF schedule at [facility]) 2.Left upper extremity A-V fistula by Dr Oneida Alar 12/30/2008 3.Aemia: Received Venofer 12/30/08 Ferritin 179 % sat 12 12/23/08, 4. Secondary Hyperparathyroidism- PTH 188, P 5.4, Ca 9.2 12/2008  . Hemodialysis-associated hypotension    coreg d/c'd  . Herniated lumbar disc without myelopathy   . History of blood transfusion   . Hyperparathyroidism due to renal insufficiency ([place])   . Hypertension    resloved after initating HD, now with post HD hypotension  . Hypocalcemia   . Hypomagnesemia   . Kidney transplant as cause of abnormal reaction or later complication [id number]   [facility]  . Non-ischemic cardiomyopathy ([place])    EF 20-25%  12/23/2008>>EF 40-45% 03/2009>>EF ??? 7/?/2011  . Occult blood positive stool   . Psoriasis    scalp  . Thrombocytopenia ([place])   . Vitamin B 12 deficiency     Past Surgical History:  Procedure Laterality Date  . AV FISTULA PLACEMENT     left  . AV FISTULA PLACEMENT  09/12/2012   Procedure: INSERTION OF ARTERIOVENOUS (AV) GORE-TEX GRAFT ARM;  Surgeon: Elam Dutch, MD;  Location: [place];  Service: Vascular;  Laterality: Left;  KIDNEY TRANSPLANT FAILED  . CATARACT EXTRACTION, BILATERAL    . EYE SURGERY    . INSERTION OF DIALYSIS CATHETER  07/14/2012   Procedure: INSERTION OF DIALYSIS CATHETER;  Surgeon: Mal Misty, MD;  Location: [place];  Service: Vascular;  Laterality: Right;  Ultrasound Guided Insertion of Internal Jugular Dialysis Catheter  . 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 KIDNEY TRANSPLANT  12/2013   second kidney transplant  . lumbar disc sugery   10/17/2017  . LUMBAR DISC SURGERY    . NEPHRECTOMY TRANSPLANTED ORGAN  08/11/12  . PHOTOCOAGULATION     for diabetic retinopathy 08/1999  . SHUNTOGRAM N/A 07/11/2012   Procedure: Earney Mallet;  Surgeon: Serafina Mitchell, MD;  Location: [facility] CATH LAB;  Service: Cardiovascular;  Laterality: N/A;    Family History  Problem Relation Age of Onset  . Diabetes Father   . Kidney disease Father   . Other  Father        amputation  . Hypertension Mother   . Dementia Mother   . Diabetes Sister   . Hypertension Sister   . Diabetes Sister   . Kidney disease Sister   . Diabetes Sister   . Hypertension Brother   . Diabetes Brother   . CAD Neg Hx   . Sudden Cardiac Death Neg Hx   . Congestive Heart Failure Neg Hx   . Colon cancer Neg Hx   . Rectal cancer Neg Hx   . Esophageal cancer Neg Hx   . Liver cancer Neg Hx     Social History   Socioeconomic History  . Marital status: Married    Spouse name: Not on file  . Number of children: 2  . Years of education: Not on file  . Highest education level:  Not on file  Occupational History  . Occupation: disabled  Social Needs  . Financial resource strain: Somewhat hard  . Food insecurity:    Worry: Never true    Inability: Never true  . Transportation needs:    Medical: No    Non-medical: No  Tobacco Use  . Smoking status: Never Smoker  . Smokeless tobacco: Never Used  Substance and Sexual Activity  . Alcohol use: No    Alcohol/week: 0.0 standard drinks  . Drug use: No  . Sexual activity: Not on file  Lifestyle  . Physical activity:    Days per week: 4 days    Minutes per session: 30 min  . Stress: Not at all  Relationships  . Social connections:    Talks on phone: More than three times a week    Gets together: More than three times a week    Attends religious service: More than 4 times per year    Active member of club or organization: No    Attends meetings of clubs or organizations: Never    Relationship status: Married  . Intimate partner violence:    Fear of current or ex partner: No    Emotionally abused: No    Physically abused: No    Forced sexual activity: No  Other Topics Concern  . Not on file  Social History Narrative   Social History      Diet?       Do you drink/eat things with caffeine? yes      Marital status?            married                        What year were you married?       Do you live in a house, apartment, assisted living, condo, trailer, etc.? apartment      Is it one or more stories? one      How many persons live in your home? 2      Do you have any pets in your home? (please list) no      Highest level of education completed?       Current or past profession: bus owner      Do you exercise?            yes                          Type & how often? Walking daily      Advanced Directives      Do you  have a living will? no      Do you have a DNR form?            no                      If not, do you want to discuss one?      Do you have signed POA/HPOA for forms? no       Functional Status      Do you have difficulty bathing or dressing yourself? yes      Do you have difficulty preparing food or eating? no      Do you have difficulty managing your medications? yes      Do you have difficulty managing your finances? no      Do you have difficulty affording your medications? yes     Physical Exam  Vital Signs and Nursing Notes reviewed Vitals:   12/24/18 2117 12/24/18 2145  BP:  120/68  Pulse: (!) 101   Resp: (!) 25 (!) 25  Temp:    SpO2: 99%     CONSTITUTIONAL: Ill-appearing, NAD NEURO:  Alert and oriented x 3, no focal deficits EYES:  eyes equal and reactive ENT/NECK:  no LAD, no JVD CARDIO: Regular rate, well-perfused, normal S1 and S2 PULM:  CTAB no wheezing or rhonchi, tachypneic GI/GU:  normal bowel sounds, non-distended, non-tender MSK/SPINE:  No gross deformities, no edema SKIN:  no rash, atraumatic PSYCH:  Appropriate speech and behavior  Diagnostic and Interventional Summary    EKG Interpretation  Date/Time:  Sunday Dec 24 2018 17:39:23 EDT Ventricular Rate:  99 PR Interval:    QRS Duration: 98 QT Interval:  396 QTC Calculation: 509 R Axis:   -66 Text Interpretation:  Sinus rhythm Left anterior fascicular block Abnormal R-wave progression, late transition Prolonged QT interval Confirmed by Gerlene Fee [phone]) on 12/24/2018 5:46:13 PM      Labs Reviewed  CBC - Abnormal; Notable for the following components:      Result Value   WBC 15.6 (*)    RBC 4.05 (*)    Hemoglobin 10.9 (*)    HCT 34.4 (*)    RDW 15.7 (*)    All other components within normal limits  COMPREHENSIVE METABOLIC PANEL - Abnormal; Notable for the following components:   Chloride 113 (*)    CO2 <7 (*)    Glucose, Bld 316 (*)    BUN 137 (*)    Creatinine, Ser 13.25 (*)    AST 9 (*)    GFR calc non Af Amer 4 (*)    GFR calc Af Amer 4 (*)    All other components within normal limits  LIPASE, BLOOD - Abnormal; Notable for the following  components:   Lipase 768 (*)    All other components within normal limits  LACTIC ACID, PLASMA - Abnormal; Notable for the following components:   Lactic Acid, Venous 2.2 (*)    All other components within normal limits  POCT I-STAT EG7 - Abnormal; Notable for the following components:   pH, Ven 6.934 (*)    pCO2, Ven 19.3 (*)    pO2, Ven 51.0 (*)    Bicarbonate 4.1 (*)    TCO2 <5 (*)    Acid-base deficit 27.0 (*)    HCT 36.0 (*)    Hemoglobin 12.2 (*)    All other components within normal limits  CBG MONITORING, ED - Abnormal; Notable for the following components:   Glucose-Capillary  281 (*)    All other components within normal limits  CBG MONITORING, ED - Abnormal; Notable for the following components:   Glucose-Capillary 245 (*)    All other components within normal limits  CBG MONITORING, ED - Abnormal; Notable for the following components:   Glucose-Capillary 248 (*)    All other components within normal limits  CBG MONITORING, ED - Abnormal; Notable for the following components:   Glucose-Capillary 273 (*)    All other components within normal limits  SARS CORONAVIRUS 2 (HOSPITAL ORDER, [place] LAB)  CULTURE, BLOOD (SINGLE)  TROPONIN I  URINALYSIS, ROUTINE W REFLEX MICROSCOPIC    DG Chest Port 1 View  Final Result      Medications  insulin regular, human (MYXREDLIN) 100 units/ 100 mL infusion (6.4 Units/hr Intravenous Rate/Dose Change 12/24/18 2112)  dextrose 5 %-0.45 % sodium chloride infusion ( Intravenous New Bag/Given 12/24/18 1928)  sodium chloride 0.9 % bolus 1,000 mL (1,000 mLs Intravenous Not Given 12/24/18 2126)  sodium chloride 0.9 % bolus 1,000 mL (0 mLs Intravenous Stopped 12/24/18 1914)  potassium chloride 10 mEq in 100 mL IVPB (0 mEq Intravenous Stopped 12/24/18 2127)     Procedures Critical Care Critical Care Documentation Critical care time provided by me (excluding procedures): 41 minutes  Condition necessitating critical  care: DKA, acute renal failure  Components of critical care management: reviewing of prior records, laboratory and imaging interpretation, frequent re-examination and reassessment of vital signs, administration of IV insulin, IV fluids, discussion with consulting services    ED Course and Medical Decision Making  I have reviewed the triage vital signs and the nursing notes.  Pertinent labs & imaging results that were available during my care of the patient were reviewed by me and considered in my medical decision making (see below for details).  Initial concern for metabolic etiology of patient's shortness of breath, largely confirmed by VBG revealing profound acidosis, profound base deficit.  Glucose is 280, suspect DKA.  Also considering acute renal failure.  Given that patient admits to being without insulin for 1 month, favoring DKA, will provide DKA protocol, insulin drip, dextrose containing fluids, close monitoring.  Discussed with hospitalist for admission to stepdown unit.  Patient was held here in the emergency department to obtain repeat gas to ensure improvement.  ABG reveals improving pH, to be admitted to the stepdown unit for further care.  Labs also reveal mild pancreatitis, significant AKI.  Barth Kirks. Sedonia Small, [place] [email or web address]  Final Clinical Impressions(s) / ED Diagnoses     ICD-10-CM   1. Diabetic ketoacidosis without coma associated with other specified diabetes mellitus ([place]) E13.10   2. SOB (shortness of breath) R06.02 DG Chest [facility] 1 View    DG Chest [place] 1 View  3. AKI (acute kidney injury) ([place]) N17.9   4. Acute pancreatitis, unspecified complication status, unspecified pancreatitis type K85.90     ED Discharge Orders    None         Maudie Flakes, MD 12/24/18 2257

## 2018-12-24 NOTE — ED Notes (Signed)
Venous I Stat 7 blood sample " Bad Sample" unable to run with resuts. Dr. Sedonia Small informed.

## 2018-12-24 NOTE — ED Notes (Signed)
Insulin gtt stopped at approximately 2215.  CBG being checked at this time. CBG 230 will enter into glucostabalizer.

## 2018-12-25 ENCOUNTER — Inpatient Hospital Stay (HOSPITAL_COMMUNITY): Payer: Medicare Other

## 2018-12-25 DIAGNOSIS — R9431 Abnormal electrocardiogram [ECG] [EKG]: Secondary | ICD-10-CM

## 2018-12-25 DIAGNOSIS — Z794 Long term (current) use of insulin: Secondary | ICD-10-CM

## 2018-12-25 DIAGNOSIS — R197 Diarrhea, unspecified: Secondary | ICD-10-CM

## 2018-12-25 DIAGNOSIS — N179 Acute kidney failure, unspecified: Secondary | ICD-10-CM

## 2018-12-25 DIAGNOSIS — Z940 Kidney transplant status: Secondary | ICD-10-CM

## 2018-12-25 DIAGNOSIS — N183 Chronic kidney disease, stage 3 unspecified: Secondary | ICD-10-CM

## 2018-12-25 DIAGNOSIS — E1111 Type 2 diabetes mellitus with ketoacidosis with coma: Secondary | ICD-10-CM

## 2018-12-25 DIAGNOSIS — D631 Anemia in chronic kidney disease: Secondary | ICD-10-CM

## 2018-12-25 DIAGNOSIS — I10 Essential (primary) hypertension: Secondary | ICD-10-CM

## 2018-12-25 LAB — LIPID PANEL
Cholesterol: 72 mg/dL (ref 0–200)
HDL: 22 mg/dL — ABNORMAL LOW (ref >40)
LDL Cholesterol: 34 mg/dL (ref 0–99)
Total CHOL/HDL Ratio: 3.3 RATIO
Triglycerides: 80 mg/dL (ref <150)
VLDL: 16 mg/dL (ref 0–40)

## 2018-12-25 LAB — BASIC METABOLIC PANEL
BUN: 122 mg/dL — ABNORMAL HIGH (ref 6–20)
BUN: 128 mg/dL — ABNORMAL HIGH (ref 6–20)
BUN: 120 mg/dL — ABNORMAL HIGH (ref 6–20)
BUN: 120 mg/dL — ABNORMAL HIGH (ref 6–20)
BUN: 112 mg/dL — ABNORMAL HIGH (ref 6–20)
CO2: <7 mmol/L — ABNORMAL LOW (ref 22–32)
CO2: <7 mmol/L — ABNORMAL LOW (ref 22–32)
CO2: <7 mmol/L — ABNORMAL LOW (ref 22–32)
CO2: <7 mmol/L — ABNORMAL LOW (ref 22–32)
CO2: <7 mmol/L — ABNORMAL LOW (ref 22–32)
Calcium: 7.9 mg/dL — ABNORMAL LOW (ref 8.9–10.3)
Calcium: 7.8 mg/dL — ABNORMAL LOW (ref 8.9–10.3)
Calcium: 7.3 mg/dL — ABNORMAL LOW (ref 8.9–10.3)
Calcium: 7.3 mg/dL — ABNORMAL LOW (ref 8.9–10.3)
Calcium: 7.2 mg/dL — ABNORMAL LOW (ref 8.9–10.3)
Chloride: 117 mmol/L — ABNORMAL HIGH (ref 98–111)
Chloride: 121 mmol/L — ABNORMAL HIGH (ref 98–111)
Chloride: 123 mmol/L — ABNORMAL HIGH (ref 98–111)
Chloride: 127 mmol/L — ABNORMAL HIGH (ref 98–111)
Chloride: 120 mmol/L — ABNORMAL HIGH (ref 98–111)
Creatinine, Ser: 11.9 mg/dL — ABNORMAL HIGH (ref 0.61–1.24)
Creatinine, Ser: 11.38 mg/dL — ABNORMAL HIGH (ref 0.61–1.24)
Creatinine, Ser: 10.71 mg/dL — ABNORMAL HIGH (ref 0.61–1.24)
Creatinine, Ser: 10.75 mg/dL — ABNORMAL HIGH (ref 0.61–1.24)
Creatinine, Ser: 10.3 mg/dL — ABNORMAL HIGH (ref 0.61–1.24)
GFR calc Af Amer: 5 mL/min — ABNORMAL LOW (ref >60)
GFR calc Af Amer: 5 mL/min — ABNORMAL LOW (ref >60)
GFR calc Af Amer: 6 mL/min — ABNORMAL LOW (ref >60)
GFR calc Af Amer: 6 mL/min — ABNORMAL LOW (ref >60)
GFR calc Af Amer: 6 mL/min — ABNORMAL LOW (ref >60)
GFR calc non Af Amer: 4 mL/min — ABNORMAL LOW (ref >60)
GFR calc non Af Amer: 5 mL/min — ABNORMAL LOW (ref >60)
GFR calc non Af Amer: 5 mL/min — ABNORMAL LOW (ref >60)
GFR calc non Af Amer: 5 mL/min — ABNORMAL LOW (ref >60)
GFR calc non Af Amer: 5 mL/min — ABNORMAL LOW (ref >60)
Glucose, Bld: 244 mg/dL — ABNORMAL HIGH (ref 70–99)
Glucose, Bld: 112 mg/dL — ABNORMAL HIGH (ref 70–99)
Glucose, Bld: 180 mg/dL — ABNORMAL HIGH (ref 70–99)
Glucose, Bld: 189 mg/dL — ABNORMAL HIGH (ref 70–99)
Glucose, Bld: 192 mg/dL — ABNORMAL HIGH (ref 70–99)
Potassium: 4.1 mmol/L (ref 3.5–5.1)
Potassium: 4.1 mmol/L (ref 3.5–5.1)
Potassium: 4 mmol/L (ref 3.5–5.1)
Potassium: 4.5 mmol/L (ref 3.5–5.1)
Potassium: 3.3 mmol/L — ABNORMAL LOW (ref 3.5–5.1)
Sodium: 137 mmol/L (ref 135–145)
Sodium: 140 mmol/L (ref 135–145)
Sodium: 140 mmol/L (ref 135–145)
Sodium: 143 mmol/L (ref 135–145)
Sodium: 140 mmol/L (ref 135–145)

## 2018-12-25 LAB — NA AND K (SODIUM & POTASSIUM), RAND UR
Potassium Urine: 30 mmol/L
Sodium, Ur: 46 mmol/L

## 2018-12-25 LAB — LACTATE DEHYDROGENASE: LDH: 123 U/L (ref 98–192)

## 2018-12-25 LAB — CBC
HCT: 28.5 % — ABNORMAL LOW (ref 39.0–52.0)
Hemoglobin: 9.1 g/dL — ABNORMAL LOW (ref 13.0–17.0)
MCH: 26.6 pg (ref 26.0–34.0)
MCHC: 31.9 g/dL (ref 30.0–36.0)
MCV: 83.3 fL (ref 80.0–100.0)
Platelets: 167 10*3/uL (ref 150–400)
RBC: 3.42 MIL/uL — ABNORMAL LOW (ref 4.22–5.81)
RDW: 15.6 % — ABNORMAL HIGH (ref 11.5–15.5)
WBC: 17.7 10*3/uL — ABNORMAL HIGH (ref 4.0–10.5)
nRBC: 0 % (ref 0.0–0.2)

## 2018-12-25 LAB — URINALYSIS, ROUTINE W REFLEX MICROSCOPIC
Bilirubin Urine: NEGATIVE
Glucose, UA: NEGATIVE mg/dL
Ketones, ur: NEGATIVE mg/dL
Leukocytes,Ua: NEGATIVE
Nitrite: NEGATIVE
Protein, ur: 30 mg/dL — AB
Specific Gravity, Urine: 1.012 (ref 1.005–1.030)
pH: 5 (ref 5.0–8.0)

## 2018-12-25 LAB — RETICULOCYTES
Immature Retic Fract: 10.9 % (ref 2.3–15.9)
RBC.: 2.5 MIL/uL — ABNORMAL LOW (ref 4.22–5.81)
Retic Count, Absolute: 35.5 10*3/uL (ref 19.0–186.0)
Retic Ct Pct: 1.4 % (ref 0.4–3.1)

## 2018-12-25 LAB — HIV ANTIBODY (ROUTINE TESTING W REFLEX): HIV Screen 4th Generation wRfx: NONREACTIVE

## 2018-12-25 LAB — RAPID URINE DRUG SCREEN, HOSP PERFORMED
Amphetamines: NOT DETECTED
Barbiturates: NOT DETECTED
Benzodiazepines: NOT DETECTED
Cocaine: NOT DETECTED
Opiates: NOT DETECTED
Tetrahydrocannabinol: NOT DETECTED

## 2018-12-25 LAB — HEMOGLOBIN A1C
Hgb A1c MFr Bld: 7.2 % — ABNORMAL HIGH (ref 4.8–5.6)
Mean Plasma Glucose: 159.94 mg/dL

## 2018-12-25 LAB — GLUCOSE, CAPILLARY
Glucose-Capillary: 108 mg/dL — ABNORMAL HIGH (ref 70–99)
Glucose-Capillary: 111 mg/dL — ABNORMAL HIGH (ref 70–99)
Glucose-Capillary: 134 mg/dL — ABNORMAL HIGH (ref 70–99)
Glucose-Capillary: 145 mg/dL — ABNORMAL HIGH (ref 70–99)
Glucose-Capillary: 169 mg/dL — ABNORMAL HIGH (ref 70–99)
Glucose-Capillary: 219 mg/dL — ABNORMAL HIGH (ref 70–99)
Glucose-Capillary: 89 mg/dL (ref 70–99)
Glucose-Capillary: 118 mg/dL — ABNORMAL HIGH (ref 70–99)
Glucose-Capillary: 138 mg/dL — ABNORMAL HIGH (ref 70–99)
Glucose-Capillary: 163 mg/dL — ABNORMAL HIGH (ref 70–99)
Glucose-Capillary: 143 mg/dL — ABNORMAL HIGH (ref 70–99)
Glucose-Capillary: 169 mg/dL — ABNORMAL HIGH (ref 70–99)
Glucose-Capillary: 176 mg/dL — ABNORMAL HIGH (ref 70–99)
Glucose-Capillary: 144 mg/dL — ABNORMAL HIGH (ref 70–99)
Glucose-Capillary: 177 mg/dL — ABNORMAL HIGH (ref 70–99)
Glucose-Capillary: 171 mg/dL — ABNORMAL HIGH (ref 70–99)
Glucose-Capillary: 158 mg/dL — ABNORMAL HIGH (ref 70–99)

## 2018-12-25 LAB — MAGNESIUM
Magnesium: 1.8 mg/dL (ref 1.7–2.4)
Magnesium: 1.5 mg/dL — ABNORMAL LOW (ref 1.7–2.4)

## 2018-12-25 LAB — BETA-HYDROXYBUTYRIC ACID
Beta-Hydroxybutyric Acid: 0.1 mmol/L (ref 0.05–0.27)
Beta-Hydroxybutyric Acid: 0.47 mmol/L — ABNORMAL HIGH (ref 0.05–0.27)

## 2018-12-25 LAB — IRON AND TIBC
Iron: 83 ug/dL (ref 45–182)
Saturation Ratios: 55 % — ABNORMAL HIGH (ref 17.9–39.5)
TIBC: 150 ug/dL — ABNORMAL LOW (ref 250–450)
UIBC: 67 ug/dL

## 2018-12-25 LAB — VITAMIN B12: Vitamin B-12: 197 pg/mL (ref 180–914)

## 2018-12-25 LAB — TROPONIN I
Troponin I: <0.03 ng/mL (ref <0.03)
Troponin I: <0.03 ng/mL (ref <0.03)

## 2018-12-25 LAB — C DIFFICILE QUICK SCREEN W PCR REFLEX
C Diff antigen: NEGATIVE
C Diff interpretation: NOT DETECTED
C Diff toxin: NEGATIVE

## 2018-12-25 LAB — FOLATE: Folate: 22.7 ng/mL (ref >5.9)

## 2018-12-25 LAB — CREATININE, URINE, RANDOM: Creatinine, Urine: 214.32 mg/dL

## 2018-12-25 LAB — FERRITIN: Ferritin: 663 ng/mL — ABNORMAL HIGH (ref 24–336)

## 2018-12-25 LAB — CK: Total CK: 58 U/L (ref 49–397)

## 2018-12-25 LAB — SODIUM, URINE, RANDOM: Sodium, Ur: <10 mmol/L

## 2018-12-25 MED ORDER — PHENOL 1.4 % MT LIQD
1.0000 | OROMUCOSAL | Status: DC | PRN
  Filled 2018-12-25: qty 177

## 2018-12-25 MED ORDER — MAGNESIUM SULFATE 50 % IJ SOLN
3.0000 g | Freq: Once | INTRAVENOUS | Status: AC
  Administered 2018-12-25: 3 g via INTRAVENOUS
  Filled 2018-12-25 (×2): qty 6

## 2018-12-25 MED ORDER — SODIUM CHLORIDE 0.9 % IV BOLUS
1500.0000 mL | Freq: Once | INTRAVENOUS | Status: AC
  Administered 2018-12-25: 1500 mL via INTRAVENOUS

## 2018-12-25 MED ORDER — MYCOPHENOLATE SODIUM 180 MG PO TBEC
720.0000 mg | DELAYED_RELEASE_TABLET | Freq: Two times a day (BID) | ORAL | Status: DC
  Administered 2018-12-25 – 2019-01-03 (×17): 720 mg via ORAL
  Filled 2018-12-25 (×20): qty 4

## 2018-12-25 MED ORDER — SODIUM BICARBONATE 8.4 % IV SOLN
50.0000 meq | Freq: Once | INTRAVENOUS | Status: AC
  Administered 2018-12-25: 50 meq via INTRAVENOUS

## 2018-12-25 MED ORDER — CHLORHEXIDINE GLUCONATE 0.12 % MT SOLN
15.0000 mL | Freq: Two times a day (BID) | OROMUCOSAL | Status: DC
  Administered 2018-12-25 – 2019-01-03 (×13): 15 mL via OROMUCOSAL
  Filled 2018-12-25 (×18): qty 15

## 2018-12-25 MED ORDER — SODIUM BICARBONATE 8.4 % IV SOLN
INTRAVENOUS | Status: AC
  Filled 2018-12-25: qty 50

## 2018-12-25 MED ORDER — CHLORHEXIDINE GLUCONATE CLOTH 2 % EX PADS
6.0000 | MEDICATED_PAD | Freq: Every day | CUTANEOUS | Status: DC
  Administered 2018-12-26 – 2018-12-29 (×4): 6 via TOPICAL

## 2018-12-25 MED ORDER — STERILE WATER FOR INJECTION IV SOLN
INTRAVENOUS | Status: DC
  Administered 2018-12-25 – 2018-12-26 (×3): via INTRAVENOUS
  Filled 2018-12-25 (×6): qty 9.71

## 2018-12-25 MED ORDER — SODIUM BICARBONATE 8.4 % IV SOLN
50.0000 meq | Freq: Once | INTRAVENOUS | Status: AC
  Administered 2018-12-25: 50 meq via INTRAVENOUS
  Filled 2018-12-25: qty 50

## 2018-12-25 MED ORDER — INSULIN ASPART 100 UNIT/ML ~~LOC~~ SOLN
0.0000 [IU] | SUBCUTANEOUS | Status: DC
  Administered 2018-12-25: 3 [IU] via SUBCUTANEOUS
  Administered 2018-12-26: 2 [IU] via SUBCUTANEOUS

## 2018-12-25 MED ORDER — ORAL CARE MOUTH RINSE
15.0000 mL | Freq: Two times a day (BID) | OROMUCOSAL | Status: DC
  Administered 2018-12-25 – 2019-01-01 (×8): 15 mL via OROMUCOSAL

## 2018-12-25 MED ORDER — TACROLIMUS 1 MG PO CAPS
5.0000 mg | ORAL_CAPSULE | Freq: Every day | ORAL | Status: DC
  Administered 2018-12-25 – 2019-01-02 (×9): 5 mg via ORAL
  Filled 2018-12-25 (×9): qty 5

## 2018-12-25 MED ORDER — WHITE PETROLATUM EX OINT
TOPICAL_OINTMENT | CUTANEOUS | Status: AC
  Administered 2018-12-25: 17:00:00
  Filled 2018-12-25: qty 28.35

## 2018-12-25 MED ORDER — TACROLIMUS 1 MG PO CAPS
6.0000 mg | ORAL_CAPSULE | Freq: Every day | ORAL | Status: DC
  Administered 2018-12-26 – 2019-01-03 (×7): 6 mg via ORAL
  Filled 2018-12-25 (×8): qty 6

## 2018-12-25 MED ORDER — INSULIN GLARGINE 100 UNIT/ML ~~LOC~~ SOLN
10.0000 [IU] | Freq: Every day | SUBCUTANEOUS | Status: DC
  Administered 2018-12-25 – 2018-12-29 (×4): 10 [IU] via SUBCUTANEOUS
  Filled 2018-12-25 (×5): qty 0.1

## 2018-12-25 NOTE — Consult Note (Signed)
[facility]    NEPHROLOGY CONSULTATION NOTE  PATIENT ID:  [name], DOB:  1971/02/03  HPI: The patient is a 48 y.o. year old male patient with a past medical history significant for end-stage renal disease status post renal transplantation, type 2 diabetes, chronic anemia, hyperlipidemia, and hypertension who presented to the emergency department with increasing shortness of breath for approximately 3 weeks with associated nausea and vomiting.  It became worse over the past 4 days.  He had occasional diarrhea.  He had apparently run out of his long-acting insulin for the past 2 weeks.  He has also not taken any of his antirejection medication due to persistent nausea and vomiting.  He was treated initially with insulin as he was thought to be in DKA.  His serum bicarb has remained less than 7.  He reports having blood work approximately 3 months ago.  He does not remember what his last creatinine was.  Review of prior records from 2019 reveals a serum creatinine of 4-6 in May 2019.  He reports that his renal function had improved since then, but does not remember the number.  Renal consultation has been called for end-stage renal disease on hemodialysis.   Past Medical History:  Diagnosis Date  . Anemia   . CKD (chronic kidney disease), stage III ([place])   . Diabetes mellitus 12/2008   A1C 8.3 12/23/08, 24hr urine protein 2793 mg/day, SPEP neg, proliferative BDR s/p photocoagulation 08/2009 done at [facility] and f/u by DR Groat  . ESRD (end stage renal disease) on dialysis ([place])    1.Initiated HD via right per cate 12/30/2008 (MWF schedule at [facility]) 2.Left upper extremity A-V fistula by Dr Oneida Alar 12/30/2008 3.Aemia: Received Venofer 12/30/08 Ferritin 179 % sat 12 12/23/08, 4. Secondary Hyperparathyroidism- PTH 188, P 5.4, Ca 9.2 12/2008  . Hemodialysis-associated hypotension    coreg d/c'd  . Herniated lumbar disc without myelopathy   . History of blood transfusion   .  Hyperparathyroidism due to renal insufficiency ([place])   . Hypertension    resloved after initating HD, now with post HD hypotension  . Hypocalcemia   . Hypomagnesemia   . Kidney transplant as cause of abnormal reaction or later complication [id number]   [facility]  . Non-ischemic cardiomyopathy ([place])    EF 20-25% 12/23/2008>>EF 40-45% 03/2009>>EF ??? 7/?/2011  . Occult blood positive stool   . Psoriasis    scalp  . Thrombocytopenia ([place])   . Vitamin B 12 deficiency     Past Surgical History:  Procedure Laterality Date  . AV FISTULA PLACEMENT     left  . AV FISTULA PLACEMENT  09/12/2012   Procedure: INSERTION OF ARTERIOVENOUS (AV) GORE-TEX GRAFT ARM;  Surgeon: Elam Dutch, MD;  Location: [place];  Service: Vascular;  Laterality: Left;  KIDNEY TRANSPLANT FAILED  . CATARACT EXTRACTION, BILATERAL    . EYE SURGERY    . INSERTION OF DIALYSIS CATHETER  07/14/2012   Procedure: INSERTION OF DIALYSIS CATHETER;  Surgeon: Mal Misty, MD;  Location: [place];  Service: Vascular;  Laterality: Right;  Ultrasound Guided Insertion of Internal Jugular Dialysis Catheter  . 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 KIDNEY TRANSPLANT  12/2013   second kidney transplant  . lumbar disc sugery   10/17/2017  . LUMBAR DISC SURGERY    . NEPHRECTOMY TRANSPLANTED ORGAN  08/11/12  . PHOTOCOAGULATION     for diabetic retinopathy 08/1999  . SHUNTOGRAM  N/A 07/11/2012   Procedure: Earney Mallet;  Surgeon: Serafina Mitchell, MD;  Location: [facility] CATH LAB;  Service: Cardiovascular;  Laterality: N/A;    Family History  Problem Relation Age of Onset  . Diabetes Father   . Kidney disease Father   . Other Father        amputation  . Hypertension Mother   . Dementia Mother   . Diabetes Sister   . Hypertension Sister   . Diabetes Sister   . Kidney disease Sister   . Diabetes Sister   . Hypertension Brother   . Diabetes Brother   . CAD Neg Hx   . Sudden Cardiac Death Neg Hx   . Congestive  Heart Failure Neg Hx   . Colon cancer Neg Hx   . Rectal cancer Neg Hx   . Esophageal cancer Neg Hx   . Liver cancer Neg Hx     Social History   Tobacco Use  . Smoking status: Never Smoker  . Smokeless tobacco: Never Used  Substance Use Topics  . Alcohol use: No    Alcohol/week: 0.0 standard drinks  . Drug use: No    REVIEW OF SYSTEMS: General: Positive fatigue, positive weakness Head:  no headaches Eyes:  no blurred vision ENT:  no sore throat Neck:  no masses CV:  no chest pain, no orthopnea Lungs: Positive shortness of breath, no cough  GI: Positive nausea, vomiting, and poor appetite.  Positives dysgeusia  GU:  no dysuria or hematuria Skin:  no rashes or lesions Neuro:  no focal numbness or weakness Psych:  no depression or anxiety    PHYSICAL EXAM:  Vitals:   12/25/18 1500 12/25/18 1600  BP:    Pulse: 97 96  Resp: (!) 22 (!) 23  Temp:    SpO2: 100% 100%   I/O last 3 completed shifts: In: 2230.1 [I.V.:801.1; IV Piggyback:1429] Out: 235 [Urine:235]   General:  AAOx3 NAD HEENT: MMM [place] AT anicteric sclera Neck:  No JVD, no adenopathy CV:  Heart RRR  Lungs:  L/S CTA bilaterally, tachypneic  abd:  abd SNT/ND with normal BS GU:  Bladder non-palpable Extremities:  No LE edema, left upper extremity fistula with a thrill and bruit Skin:  No skin rash Psych:  normal mood and affect Neuro:  no focal deficits   CURRENT MEDICATIONS:  . chlorhexidine  15 mL Mouth Rinse BID  . [START ON 12/26/2018] Chlorhexidine Gluconate Cloth  6 each Topical Q0600  . heparin  5,000 Units Subcutaneous Q8H  . insulin aspart  0-15 Units Subcutaneous Q4H  . insulin glargine  10 Units Subcutaneous Daily  . mouth rinse  15 mL Mouth Rinse q12n4p  . mycophenolate  720 mg Oral BID  . sodium bicarbonate      . tacrolimus  6 mg Oral Daily   And  . tacrolimus  5 mg Oral QHS     HOME MEDICATIONS:  Prior to Admission medications   Medication Sig Start Date End Date Taking?  Authorizing Provider  aspirin EC 81 MG tablet Take 81 mg by mouth daily.   Yes [provider]  carvedilol (COREG) 6.25 MG tablet Take 1 tablet (6.25 mg total) by mouth 2 (two) times daily with a meal. 12/20/17  Yes [name], NP  dapsone 25 MG tablet Take 50 mg by mouth daily.   Yes [provider]  folic acid (FOLVITE) 1 MG tablet Take 1 mg by mouth daily.  06/24/15  Yes [provider]  mycophenolate (MYFORTIC) 180 MG EC tablet Take 720 mg by mouth 2 (two) times daily.    Yes [provider]  NOVOLOG FLEXPEN 100 UNIT/ML FlexPen INJECT 12 UNITS INTO THE SKIN 3 TIMES DAILY BEFORE MEALS. Patient taking differently: Inject 12 Units into the skin 3 (three) times daily.  10/31/18  Yes [name], NP  omeprazole (PRILOSEC) 20 MG capsule Take 1 capsule (20 mg total) by mouth daily. 12/20/17  Yes [name], NP  tacrolimus (PROGRAF) 1 MG capsule Take 5-6 mg by mouth See admin instructions. 6 mg in the morning and 5 mg in the evening - 1000, 2200   Yes [provider]  TRESIBA FLEXTOUCH 100 UNIT/ML SOPN FlexTouch Pen Inject 100 Units into the skin as directed. 08/22/18  Yes [provider]  atorvastatin (LIPITOR) 20 MG tablet TAKE 1 TABLET BY MOUTH AT BEDTIME FOR CHOLESTEROL Patient not taking: Reported on 10/09/2018 08/02/18   [name], NP  Continuous Blood Gluc Receiver ([place] KIT) [place] 1 each by Does not apply route 3 (three) times daily. Use to test blood sugar three times daily. Dx: E11.9 08/03/18   [name], NP  Continuous Blood Gluc Sensor (DEXCOM G6 SENSOR) MISC 1 each by Does not apply route 3 (three) times daily. Use to test blood sugar three times daily. Dx: E11.9 08/03/18   [name], NP  Continuous Blood Gluc Transmit (DEXCOM G6 TRANSMITTER) MISC 1 each by Does not apply route 3 (three) times daily. Use to test blood sugar three times daily. Dx: E11.9 08/03/18   [name], NP   Insulin Pen Needle 31G X 5 MM MISC 5 Units/oz/day by Does not apply route at bedtime. Use 4 times daily to inject insulin to skin 12/07/17   Georgette Shell, MD  ketoconazole (NIZORAL) 2 % shampoo Apply topically 2 (two) times a week. Wash hair and beard.  Leave shampoo on skin for 5 mins before washing off. Patient not taking: Reported on 10/09/2018 04/24/14   Kelby Aline, MD  Magnesium 400 MG CAPS Take 1,600 mg by mouth 2 (two) times daily.     [provider]  sodium bicarbonate 650 MG tablet Take 1,300 mg by mouth 3 (three) times daily.  06/24/15   [provider]       LABS:  CBC Latest Ref Rng & Units 12/25/2018 12/24/2018 12/24/2018  WBC 4.0 - 10.5 K/uL 17.7(H) 15.6(H) -  Hemoglobin 13.0 - 17.0 g/dL 9.1(L) 10.9(L) 12.2(L)  Hematocrit 39.0 - 52.0 % 28.5(L) 34.4(L) 36.0(L)  Platelets 150 - 400 K/uL 167 212 -    CMP Latest Ref Rng & Units 12/25/2018 12/25/2018 12/25/2018  Glucose 70 - 99 mg/dL 189(H) 180(H) 112(H)  BUN 6 - 20 mg/dL 120(H) 120(H) 128(H)  Creatinine 0.61 - 1.24 mg/dL 10.75(H) 10.71(H) 11.38(H)  Sodium 135 - 145 mmol/L 143 140 140  Potassium 3.5 - 5.1 mmol/L 4.5 4.0 4.1  Chloride 98 - 111 mmol/L 127(H) 123(H) 121(H)  CO2 22 - 32 mmol/L <7(L) <7(L) <7(L)  Calcium 8.9 - 10.3 mg/dL 7.3(L) 7.3(L) 7.8(L)  Total Protein 6.5 - 8.1 g/dL - - -  Total Bilirubin 0.3 - 1.2 mg/dL - - -  Alkaline Phos 38 - 126 U/L - - -  AST 15 - 41 U/L - - -  ALT 0 - 44 U/L - - -    Lab Results  Component Value Date   PTH 188.0 (H) 12/23/2008   CALCIUM 7.3 (  L) 12/25/2018   CAION 1.24 12/24/2018   PHOS 5.4 (H) 01/06/2009       Component Value Date/Time   COLORURINE YELLOW 12/25/2018 0200   APPEARANCEUR CLOUDY (A) 12/25/2018 0200   LABSPEC 1.012 12/25/2018 0200   PHURINE 5.0 12/25/2018 0200   GLUCOSEU NEGATIVE 12/25/2018 0200   HGBUR MODERATE (A) 12/25/2018 0200   BILIRUBINUR NEGATIVE 12/25/2018 0200   KETONESUR NEGATIVE 12/25/2018 0200   PROTEINUR 30 (A)  12/25/2018 0200   UROBILINOGEN 0.2 12/23/2008 1845   NITRITE NEGATIVE 12/25/2018 0200   LEUKOCYTESUR NEGATIVE 12/25/2018 0200      Component Value Date/Time   HCO3 4.1 (L) 12/24/2018 1801   TCO2 <5 (L) 12/24/2018 1801   ACIDBASEDEF 27.0 (H) 12/24/2018 1801   O2SAT 62.0 12/24/2018 1801       Component Value Date/Time   IRON 120 01/19/2018 1928   IRON 91 08/26/2016   TIBC 230 (L) 01/19/2018 1928   TIBC 183 08/26/2016   FERRITIN 1,155 (H) 01/19/2018 1928   FERRITIN 3,610 08/26/2016   IRONPCTSAT 52 (H) 01/19/2018 1928       ASSESSMENT/PLAN:    The patient is a 48 y.o. year old male patient with a past medical history significant for end-stage renal disease status post renal transplantation, type 2 diabetes, chronic anemia, hyperlipidemia, and hypertension who presented to the emergency department with increasing shortness of breath for approximately 3 weeks with associated nausea and vomiting.  1.  End-stage renal disease status post renal transplantation.  His most recent office visit to [facility] transplant was in 2017-11-16.  He received a deceased donor renal transplant on 12/26/2013.  100% PRA, KDPII greater than 85.  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  His baseline creatinine as documented back in 2019 was about 2-3.  He is on maintenance immunosuppression with tacrolimus 6 in the morning and 5 at night, and Myfortic 4 tabs twice daily.  He was previously on prednisone, but he reports that was discontinued.  2.  Acute kidney injury.  Given his symptoms, it certainly sounds like more progression of underlying chronic kidney disease.  He has had 3 weeks worth of symptoms.  His creatinine is over 10.  He has uremic symptoms.  We will plan for 2 hours of dialysis today. Hopefully, we will be able to use his fistula.  If we are unable to use it, he will need an  emergent dialysis catheter.  3.  Non-anion gap metabolic acidosis.  This is profound, with a bicarbonate level of less than 7.  We will plan for 2 hours of dialysis this evening.  IV bicarbonate drip ordered by primary in the interim.  4.  Diabetic ketoacidosis.  I am not clear that this is the cause of his acidosis, as he appears to be non-gapped and has no ketones in his urine.  Blood sugar has improved.  5.  Secondary hyperparathyroidism.  PTH is within goal for CKD stage.  Phosphorus is 5.4.  We will continue to monitor.  6.  Anemia of chronic kidney disease.  Check iron panel.   [place], DO, [place]

## 2018-12-25 NOTE — Progress Notes (Signed)
Patient refused to take antirejection meds. MD made aware.

## 2018-12-25 NOTE — Progress Notes (Signed)
PROGRESS NOTE    [name]  BTD:[id number] DOB: 07-08-1971 DOA: 12/24/2018 PCP: [name], NP   Brief Narrative:  48 y.o. BM PMHx male with history of renal transplant, diabetes mellitus type 2, chronic anemia, hyperlipidemia, hypertension, noncompliant medication   Presents to the ER because of increasing shortness of breath over the last 4 days with increasing nausea vomiting.  Occasional diarrhea.  Denies any abdominal pain chest pain or fever chills or productive cough.   Patient states he has ran out of his long-acting insulin for last 2 weeks.  And over the last 2 days he has not taken any of his antirejection medication due to persistent nausea vomiting.  Denies any blood in the vomitus.   ED Course: In the ER patient was tachypneic.  Afebrile.  Not hypoxic.  Chest x-ray unremarkable.  COVID-19 was negative.  Labs reveal creatinine of 13.25 which has increased from 6.25 last June 2019.  Lipase was 768 but patient denies any abdominal pain.  Total bilirubin was 0.3.  Bicarb was non-calculable.  Venous blood gas was showing pH of 6.9.  Urinalysis is negative for any casts negative for leukocyte Estrace nitrites urine sodium was less than 10.  Patient was given total of 3 L normal saline bolus continue on infusion.  I discussed with on-call nephrology Dr. Posey Pronto who advised patient is likely cause for renal failure as his DKA and to continue with aggressive hydration.    Subjective: 5/11 A/O x4, negative CP, negative S OB, negative abdominal pain.  States he does not feel good.  States has been missing doses of insulin, has been missing doses of his antirejection medication for his renal transplant.  States has been having diarrhea since last Tuesday.   Assessment & Plan:   Principal Problem:   DKA (diabetic ketoacidoses) ([place]) Active Problems:   Anemia in chronic kidney disease   Essential hypertension, benign   Congestive dilated cardiomyopathy (HCC)   Deceased-donor  kidney transplant   ARF (acute renal failure) ([place])   Renal transplant recipient   Acute renal failure superimposed on stage 3 chronic kidney disease (HCC)   Prolonged QT interval   Acute diarrhea  Severe DKA/diabetes type 2 uncontrolled with complication -1/06 hemoglobin A1c= 7.2 - Patient's anion gap has not closed although patient alert awake demanding food. - CBG<2004; while on glucose stabilizer protocol -Lantus 10 units.Discontinue glucose stabilizer 2 hours after Lantus administered - Moderate SSI - Sodium bicarb x  2 amp - Hemoglobin A1c pending -Lipid panel pending - Rapid urine tox screen negative  HLD - Lipid panel pending  Acute on CKD stage III (baseline Cr = 5)/Renal transplant -Strict in and out -Daily weight - 5/11 start sodium bicarbonate- 1/4 normal saline at 177ml/hr -Per admission note Dr. Posey Pronto nephrology advised to continue antirejection medication at current dose. -Mycophenolate 720 mg twice daily -Tacrolimus 6 mg every morning/5 mg nightly -Tacrolimus level pending -Consulted nephrology - Urine sodium<10, urine creatinine 214.32 - Urine calcium pending - Urine Na/K pending  Essential HTN - Not on medication, BP controlled. - Patient has had borderline BP - Hold Coreg  Diarrhea - Patient with watery diarrhea for~7 days. - Stool culture pending -C. difficile pending  Anemia of chronic disease (baseline Hgb ~10) - Occult blood pending - Anemia panel, LDH, haptoglobin to verify  Prolonged QT interval -Obtain EKG in a.m.  DKA stabilized, will have corrected all electrolyte abnormalities - Hold all QT prolonging medication  Goals of care - 5/11 initially patient  stated he would not do HD, however after a prolonged conversation in which he understood that he would need to be made DNR he elected to remain full code and except HD if nephrology decided he required today.   DVT prophylaxis: Subcu heparin Code Status: Full Family Communication:  None Disposition Plan: TBD   Consultants:  Nephrology    Procedures/Significant Events:  5/11 CT renal stone study:-gallbladder is mildly distended and may contain dependent sludge; evaluation is particularly limited by breath motion artifact -Atrophic native kidneys are present bilaterally with small bilateral nonobstructive calculi and/or medullary calcifications. -No hydronephrosis or ureteral calculus. 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 - colon is fluid-filled to the rectum, in keeping with diarrheal illness. 5/11 CT abdomen pelvis wo contrast:   I have personally reviewed and interpreted all radiology studies and my findings are as above.  VENTILATOR SETTINGS:    Cultures 5/11 stool culture pending 5/11 C. difficile pending  Antimicrobials: None  Devices    LINES / TUBES:      Continuous Infusions: . sodium chloride    . dextrose 5 % and 0.45% NaCl 150 mL/hr at 12/25/18 1054  . insulin 0.5 Units/hr (12/25/18 1551)  . magnesium sulfate bolus IVPB 3 g (12/25/18 1706)  .  sodium bicarbonate infusion 1/4 NS 1000 mL 125 mL/hr at 12/25/18 1705     Objective: Vitals:   12/25/18 0825 12/25/18 1413 12/25/18 1500 12/25/18 1600  BP: 124/64 (!) 119/59    Pulse: (!) 103 95 97 96  Resp: (!) 26 (!) 22 (!) 22 (!) 23  Temp:  98.4 F (36.9 C)    TempSrc:  Oral    SpO2: 100% 100% 100% 100%  Weight:      Height:        Intake/Output Summary (Last 24 hours) at 12/25/2018 1737 Last data filed at 12/25/2018 1500 Gross per 24 hour  Intake 3475.46 ml  Output 235 ml  Net 3240.46 ml   Filed Weights   12/24/18 1737  Weight: 54.4 kg    Examination:  General: A/O x4, no acute respiratory distress, cachectic Eyes: negative scleral hemorrhage, negative anisocoria, negative icterus ENT: Negative Runny nose, negative gingival bleeding, Neck:  Negative scars, masses, torticollis,  lymphadenopathy, JVD Lungs: Clear to auscultation bilaterally without wheezes or crackles Cardiovascular: Regular rate and rhythm without murmur gallop or rub normal S1 and S2 Abdomen: negative abdominal pain, nondistended, positive soft, bowel sounds, no rebound, no ascites, no appreciable mass Extremities: No significant cyanosis, clubbing, or edema bilateral lower extremities Skin: Negative rashes, lesions, ulcers Psychiatric:  Negative depression, negative anxiety, negative fatigue, negative mania  Central nervous system:  Cranial nerves II through XII intact, tongue/uvula midline, all extremities muscle strength 5/5, sensation intact throughout, negative dysarthria, negative expressive aphasia, negative receptive aphasia.  .     Data Reviewed: Care during the described time interval was provided by me .  I have reviewed this patient's available data, including medical history, events of note, physical examination, and all test results as part of my evaluation.   CBC: Recent Labs  Lab 12/24/18 1801 12/24/18 1804 12/25/18 0110  WBC  --  15.6* 17.7*  HGB 12.2* 10.9* 9.1*  HCT 36.0* 34.4* 28.5*  MCV  --  84.9 83.3  PLT  --  212 253   Basic Metabolic Panel: Recent Labs  Lab 12/24/18 1804 12/25/18 0110 12/25/18 0455 12/25/18 0843 12/25/18 1340  NA 139 137 140 140  143  K 4.5 4.1 4.1 4.0 4.5  CL 113* 117* 121* 123* 127*  CO2 <7* <7* <7* <7* <7*  GLUCOSE 316* 244* 112* 180* 189*  BUN 137* 122* 128* 120* 120*  CREATININE 13.25* 11.90* 11.38* 10.71* 10.75*  CALCIUM 9.0 7.9* 7.8* 7.3* 7.3*  MG  --  1.8  --   --  1.5*   GFR: Estimated Creatinine Clearance: 6.3 mL/min (A) (by C-G formula based on SCr of 10.75 mg/dL (H)). Liver Function Tests: Recent Labs  Lab 12/24/18 1804  AST 9*  ALT 12  ALKPHOS 113  BILITOT 0.3  PROT 7.3  ALBUMIN 4.5   Recent Labs  Lab 12/24/18 1804  LIPASE 768*   No results for input(s): AMMONIA in the last 168 hours. Coagulation Profile:  No results for input(s): INR, PROTIME in the last 168 hours. Cardiac Enzymes: Recent Labs  Lab 12/24/18 1804 12/25/18 0110 12/25/18 0455  CKTOTAL  --  58  --   TROPONINI <0.03 <0.03 <0.03   BNP (last 3 results) No results for input(s): PROBNP in the last 8760 hours. HbA1C: Recent Labs    12/25/18 0110  HGBA1C 7.2*   CBG: Recent Labs  Lab 12/25/18 1205 12/25/18 1335 12/25/18 1416 12/25/18 1549 12/25/18 1718  GLUCAP 143* 176* 169* 144* 177*   Lipid Profile: Recent Labs    12/25/18 0841  CHOL 72  HDL 22*  LDLCALC 34  TRIG 80  CHOLHDL 3.3   Thyroid Function Tests: No results for input(s): TSH, T4TOTAL, FREET4, T3FREE, THYROIDAB in the last 72 hours. Anemia Panel: No results for input(s): VITAMINB12, FOLATE, FERRITIN, TIBC, IRON, RETICCTPCT in the last 72 hours. Urine analysis:    Component Value Date/Time   COLORURINE YELLOW 12/25/2018 0200   APPEARANCEUR CLOUDY (A) 12/25/2018 0200   LABSPEC 1.012 12/25/2018 0200   PHURINE 5.0 12/25/2018 0200   GLUCOSEU NEGATIVE 12/25/2018 0200   HGBUR MODERATE (A) 12/25/2018 0200   BILIRUBINUR NEGATIVE 12/25/2018 0200   KETONESUR NEGATIVE 12/25/2018 0200   PROTEINUR 30 (A) 12/25/2018 0200   UROBILINOGEN 0.2 12/23/2008 1845   NITRITE NEGATIVE 12/25/2018 0200   LEUKOCYTESUR NEGATIVE 12/25/2018 0200   Sepsis Labs: @LABRCNTIP (procalcitonin:4,lacticidven:4)  ) Recent Results (from the past 240 hour(s))  Culture, blood (single)     Status: None (Preliminary result)   Collection Time: 12/24/18  6:04 PM  Result Value Ref Range Status   Specimen Description BLOOD SITE NOT SPECIFIED  Final   Special Requests   Final    BOTTLES DRAWN AEROBIC AND ANAEROBIC Blood Culture adequate volume   Culture   Final    NO GROWTH < 24 HOURS Performed at [facility], [place] [address], [place], [place] [address]    Report Status PENDING  Incomplete  SARS Coronavirus 2 (CEPHEID - Performed in [place] hospital lab), Hosp Order      Status: None   Collection Time: 12/24/18  6:19 PM  Result Value Ref Range Status   SARS Coronavirus 2 NEGATIVE NEGATIVE Final    Comment: (NOTE) If result is NEGATIVE SARS-CoV-2 target nucleic acids are NOT DETECTED. The SARS-CoV-2 RNA is generally detectable in upper and lower  respiratory specimens during the acute phase of infection. The lowest  concentration of SARS-CoV-2 viral copies this assay can detect is 250  copies / mL. A negative result does not preclude SARS-CoV-2 infection  and should not be used as the sole basis for treatment or other  patient management decisions.  A negative result may occur  with  improper specimen collection / handling, submission of specimen other  than nasopharyngeal swab, presence of viral mutation(s) within the  areas targeted by this assay, and inadequate number of viral copies  (<250 copies / mL). A negative result must be combined with clinical  observations, patient history, and epidemiological information. If result is POSITIVE SARS-CoV-2 target nucleic acids are DETECTED. The SARS-CoV-2 RNA is generally detectable in upper and lower  respiratory specimens dur ing the acute phase of infection.  Positive  results are indicative of active infection with SARS-CoV-2.  Clinical  correlation with patient history and other diagnostic information is  necessary to determine patient infection status.  Positive results do  not rule out bacterial infection or co-infection with other viruses. If result is PRESUMPTIVE POSTIVE SARS-CoV-2 nucleic acids MAY BE PRESENT.   A presumptive positive result was obtained on the submitted specimen  and confirmed on repeat testing.  While 2019 novel coronavirus  (SARS-CoV-2) nucleic acids may be present in the submitted sample  additional confirmatory testing may be necessary for epidemiological  and / or clinical management purposes  to differentiate between  SARS-CoV-2 and other Sarbecovirus currently known to  infect humans.  If clinically indicated additional testing with an alternate test  methodology [phone]) is advised. The SARS-CoV-2 RNA is generally  detectable in upper and lower respiratory sp ecimens during the acute  phase of infection. The expected result is Negative. Fact Sheet for Patients:  StrictlyIdeas.no Fact Sheet for Healthcare Providers: BankingDealers.co.za This test is not yet approved or cleared by the [place] FDA and has been authorized for detection and/or diagnosis of SARS-CoV-2 by FDA under an Emergency Use Authorization (EUA).  This EUA will remain in effect (meaning this test can be used) for the duration of the COVID-19 declaration under Section 564(b)(1) of the Act, 21 U.S.C. section 360bbb-3(b)(1), unless the authorization is terminated or revoked sooner. Performed at [facility], [place] [address], [place], [place] [address]          Radiology Studies: Dg Chest Port 1 View  Result Date: 12/24/2018 CLINICAL DATA:  Tachypnea EXAM: PORTABLE CHEST 1 VIEW COMPARISON:  01/18/2018 FINDINGS: The heart size and mediastinal contours are within normal limits. Both lungs are clear. The visualized skeletal structures are unremarkable. IMPRESSION: No acute abnormality of the lungs in AP portable projection. Electronically Signed   By: Eddie Candle M.D.   On: 12/24/2018 18:41   Ct Renal Stone Study  Result Date: 12/25/2018 CLINICAL DATA:  Flank pain, history of renal transplant EXAM: CT ABDOMEN AND PELVIS WITHOUT CONTRAST TECHNIQUE: Multidetector CT imaging of the abdomen and pelvis was performed following the standard protocol without IV contrast. COMPARISON:  None. FINDINGS: Examination of the abdomen pelvis is generally limited by motion artifact throughout. Lower chest: No acute abnormality. Hepatobiliary: No focal liver abnormality is seen. The gallbladder is mildly distended and may contain dependent sludge;  evaluation is particularly limited by breath motion artifact through this level. Pancreas: Unremarkable. No pancreatic ductal dilatation or surrounding inflammatory changes. Spleen: Normal in size without focal abnormality. Adrenals/Urinary Tract: Adrenal glands are unremarkable. 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 No hydronephrosis or ureteral calculus. 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 Bladder is unremarkable. Stomach/Bowel: Stomach is within normal limits. Appendix appears normal. The colon is fluid-filled to the rectum. Vascular/Lymphatic: No significant vascular findings are present. No enlarged abdominal or pelvic  lymph nodes. Reproductive: No mass or other abnormality. Other: No abdominal wall hernia or abnormality. No abdominopelvic ascites. Musculoskeletal: No acute or significant osseous findings. IMPRESSION: 1. Examination of the abdomen and pelvis is generally limited by motion artifact throughout. Within this limitation, no definite non-contrast CT findings to explain lower abdominal pain. 2. The gallbladder is mildly distended and may contain dependent sludge; evaluation is particularly limited by breath motion artifact through this level. 3. 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 No hydronephrosis or ureteral calculus. 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 4. The colon is fluid-filled to the rectum, in keeping with diarrheal illness. Electronically Signed   By: Eddie Candle M.D.   On: 12/25/2018 08:43        Scheduled Meds: . chlorhexidine  15 mL Mouth Rinse BID  . [START ON 12/26/2018] Chlorhexidine Gluconate Cloth  6 each Topical Q0600  . heparin  5,000 Units  Subcutaneous Q8H  . insulin aspart  0-15 Units Subcutaneous Q4H  . insulin glargine  10 Units Subcutaneous Daily  . mouth rinse  15 mL Mouth Rinse q12n4p  . mycophenolate  720 mg Oral BID  . sodium bicarbonate      . tacrolimus  6 mg Oral Daily   And  . tacrolimus  5 mg Oral QHS   Continuous Infusions: . sodium chloride    . dextrose 5 % and 0.45% NaCl 150 mL/hr at 12/25/18 1054  . insulin 0.5 Units/hr (12/25/18 1551)  . magnesium sulfate bolus IVPB 3 g (12/25/18 1706)  .  sodium bicarbonate infusion 1/4 NS 1000 mL 125 mL/hr at 12/25/18 1705     LOS: 1 day   The patient is critically ill with multiple organ systems failure and requires high complexity decision making for assessment and support, frequent evaluation and titration of therapies, application of advanced monitoring technologies and extensive interpretation of multiple databases. Critical Care Time devoted to patient care services described in this note  Time spent: 40 minutes     WOODS, Geraldo Docker, MD [facility] Pager [phone]  If 7PM-7AM, please contact night-coverage [email or web address] Password [facility] 12/25/2018, 5:37 PM

## 2018-12-25 NOTE — Progress Notes (Addendum)
Inpatient Diabetes Program Recommendations  AACE/ADA: New Consensus Statement on Inpatient Glycemic Control (2015)  Target Ranges:  Prepandial:   less than 140 mg/dL      Peak postprandial:   less than 180 mg/dL (1-2 hours)      Critically ill patients:  140 - 180 mg/dL   Lab Results  Component Value Date   GLUCAP 169 (H) 12/25/2018   HGBA1C 7.2 (H) 12/25/2018   Results for NAVRAJ, DREIBELBIS (MRN [id number]) as of 12/25/2018 15:14  Ref. Range 12/25/2018 06:09 12/25/2018 07:02 12/25/2018 08:51 12/25/2018 09:56 12/25/2018 11:04 12/25/2018 12:05 12/25/2018 13:35 12/25/2018 14:16  Glucose-Capillary Latest Ref Range: 70 - 99 mg/dL 118 (H) 145 (H) 169 (H) 163 (H) 138 (H) 143 (H) 176 (H) 169 (H)     Review of Glycemic Control  Diabetes history: DM2  Outpatient Diabetes medications: Novolog 1- 12 units tid meals (SSI)                                                        Tresiba 5 units QHS (verified with wife 5/13)  Current orders for Inpatient glycemic control: Current insulin drip at 0.5 units/hour   On admission lab glucose 316mg /dl. DKA. Labs last night on admission CO2 <7 / AG resulted "not calculated". Order for Sodium Bicarb 50 meq and was given at 1216. Repeat BMET done at 1340 and no change in CO2 and AG. Have spoken to [place] (Physiological scientist) and she has paged Dr. Sherral Hammers regarding new BMET results and will also ask about future BMET orders (no more ordered). Insulin drip/glucostabilizer still going. D51/2 NS at 150cc/hr.     -- Will follow during hospitalization.--  Jonna Clark RN, MSN Diabetes Coordinator Inpatient Glycemic Control Team Team Pager: [phone] (8am-5pm)

## 2018-12-25 NOTE — Progress Notes (Addendum)
              Inpatient Diabetes Program Recommendations  AACE/ADA: New Consensus Statement on Inpatient Glycemic Control (2015)  Target Ranges:  Prepandial:   less than 140 mg/dL      Peak postprandial:   less than 180 mg/dL (1-2 hours)      Critically ill patients:  140 - 180 mg/dL   Lab Results  Component Value Date   GLUCAP 177 (H) 12/25/2018   HGBA1C 7.2 (H) 12/25/2018    Diabetes history: DM2  Outpatient Diabetes medications: Novolog 1- 12 units tid meals (SSI)                                                        Tresiba 5 units QHS (verified with wife 5/13)  Spoke to bedside RN and patient is being transitioned off the insulin drip. She gave the Lantus 10 units at 1649 and states will be d/c the insulin drip shortly. Patient to go to dialysis tonight.   Patient has Novolog moderate scale (0-15 units) Q4 ordered and would recommend to decrease.   MD please consider the following:  Decrease Novolog to SENSITIVE scale (0-9 units) Q4 hours.     Thank you.     -- Will follow during hospitalization.--  Jonna Clark RN, MSN Diabetes Coordinator Inpatient Glycemic Control Team Team Pager: [phone] (8am-5pm)

## 2018-12-25 NOTE — Progress Notes (Signed)
Nutrition Brief Note  Patient identified on the Malnutrition Screening Tool (MST) Report  Wt Readings from Last 15 Encounters:  12/24/18 54.4 kg  08/01/18 57.4 kg  02/06/18 52.2 kg  01/27/18 54.4 kg  01/18/18 50.7 kg  01/16/18 53.4 kg  12/20/17 57.2 kg  12/06/17 58.2 kg  03/18/17 66.2 kg  02/09/17 61 kg  07/16/15 73.3 kg  01/29/15 75.7 kg  01/16/15 76.6 kg  01/08/15 77.2 kg  01/01/15 74.6 kg   [name] is a 48 y.o. male with history of renal transplant, diabetes mellitus type 2, chronic anemia, hyperlipidemia, hypertension who presents to the ER because of increasing shortness of breath over the last 4 days with increasing nausea vomiting.  Occasional diarrhea.  Denies any abdominal pain chest pain or fever chills or productive cough.  Pt admitted with DKA.   Attempted to speak with pt via phone to obtain additional history, however, unable to reach.   Pr MD notes, pt ran out of long acting insulin 2 weeks ago. He has also been refusing his anti-rejection medications for the past 3 days secondary to nausea and vomiting.   Lab Results  Component Value Date   HGBA1C 7.2 (H) 12/25/2018   PTA DM medications are 100 units tresiba daily, 12 unit insulin aspart TID. Pt also used dexcom CGM PTA.   Labs reviewed: CBGS: 143-176 (inpatient orders for glycemic control are insulin drip).   Body mass index is 19.97 kg/m. Patient meets criteria for normal weight range based on current BMI.   Current diet order is carb modified, patient is consuming approximately n/a% of meals at this time. Labs and medications reviewed.   No nutrition interventions warranted at this time. If nutrition issues arise, please consult RD.   Kolbee Bogusz A. Jimmye Norman, RD, LDN, [place] Registered Dietitian II Certified Diabetes Care and Education Specialist Pager: [phone] After hours Pager: [phone]

## 2018-12-26 DIAGNOSIS — D696 Thrombocytopenia, unspecified: Secondary | ICD-10-CM

## 2018-12-26 LAB — HEMOGLOBIN AND HEMATOCRIT, BLOOD
HCT: 22.9 % — ABNORMAL LOW (ref 39.0–52.0)
Hemoglobin: 8 g/dL — ABNORMAL LOW (ref 13.0–17.0)

## 2018-12-26 LAB — CBC
HCT: 18.7 % — ABNORMAL LOW (ref 39.0–52.0)
Hemoglobin: 6.5 g/dL — CL (ref 13.0–17.0)
MCH: 27 pg (ref 26.0–34.0)
MCHC: 34.8 g/dL (ref 30.0–36.0)
MCV: 77.6 fL — ABNORMAL LOW (ref 80.0–100.0)
Platelets: 95 10*3/uL — ABNORMAL LOW (ref 150–400)
RBC: 2.41 MIL/uL — ABNORMAL LOW (ref 4.22–5.81)
RDW: 14.5 % (ref 11.5–15.5)
WBC: 6.8 10*3/uL (ref 4.0–10.5)
nRBC: 0 % (ref 0.0–0.2)

## 2018-12-26 LAB — RENAL FUNCTION PANEL
Albumin: 3 g/dL — ABNORMAL LOW (ref 3.5–5.0)
Anion gap: 12 (ref 5–15)
BUN: 68 mg/dL — ABNORMAL HIGH (ref 6–20)
CO2: 17 mmol/L — ABNORMAL LOW (ref 22–32)
Calcium: 7.2 mg/dL — ABNORMAL LOW (ref 8.9–10.3)
Chloride: 113 mmol/L — ABNORMAL HIGH (ref 98–111)
Creatinine, Ser: 7.19 mg/dL — ABNORMAL HIGH (ref 0.61–1.24)
GFR calc Af Amer: 9 mL/min — ABNORMAL LOW (ref >60)
GFR calc non Af Amer: 8 mL/min — ABNORMAL LOW (ref >60)
Glucose, Bld: 99 mg/dL (ref 70–99)
Phosphorus: 2.7 mg/dL (ref 2.5–4.6)
Potassium: 2.1 mmol/L — CL (ref 3.5–5.1)
Sodium: 142 mmol/L (ref 135–145)

## 2018-12-26 LAB — FERRITIN: Ferritin: 701 ng/mL — ABNORMAL HIGH (ref 24–336)

## 2018-12-26 LAB — GLUCOSE, CAPILLARY
Glucose-Capillary: 108 mg/dL — ABNORMAL HIGH (ref 70–99)
Glucose-Capillary: 95 mg/dL (ref 70–99)
Glucose-Capillary: 92 mg/dL (ref 70–99)
Glucose-Capillary: 130 mg/dL — ABNORMAL HIGH (ref 70–99)
Glucose-Capillary: 90 mg/dL (ref 70–99)
Glucose-Capillary: 101 mg/dL — ABNORMAL HIGH (ref 70–99)

## 2018-12-26 LAB — CALCIUM, URINE, RANDOM: Calcium, Ur: 2.3 mg/dL

## 2018-12-26 LAB — IRON AND TIBC
Iron: 79 ug/dL (ref 45–182)
Saturation Ratios: 52 % — ABNORMAL HIGH (ref 17.9–39.5)
TIBC: 151 ug/dL — ABNORMAL LOW (ref 250–450)
UIBC: 72 ug/dL

## 2018-12-26 LAB — PROTIME-INR
INR: 1.2 (ref 0.8–1.2)
Prothrombin Time: 15.5 seconds — ABNORMAL HIGH (ref 11.4–15.2)

## 2018-12-26 LAB — PREPARE RBC (CROSSMATCH)

## 2018-12-26 LAB — APTT: aPTT: 25 seconds (ref 24–36)

## 2018-12-26 MED ORDER — PENTAFLUOROPROP-TETRAFLUOROETH EX AERO: 1.0000 "application " | INHALATION_SPRAY | CUTANEOUS | Status: DC | PRN

## 2018-12-26 MED ORDER — INSULIN ASPART 100 UNIT/ML ~~LOC~~ SOLN
0.0000 [IU] | Freq: Three times a day (TID) | SUBCUTANEOUS | Status: DC
  Administered 2018-12-27 – 2018-12-31 (×3): 1 [IU] via SUBCUTANEOUS

## 2018-12-26 MED ORDER — LOPERAMIDE HCL 2 MG PO CAPS
2.0000 mg | ORAL_CAPSULE | Freq: Four times a day (QID) | ORAL | Status: DC | PRN
  Administered 2018-12-26 – 2018-12-27 (×3): 2 mg via ORAL
  Filled 2018-12-26 (×3): qty 1

## 2018-12-26 MED ORDER — INSULIN ASPART 100 UNIT/ML ~~LOC~~ SOLN: 0.0000 [IU] | Freq: Every day | SUBCUTANEOUS | Status: DC

## 2018-12-26 MED ORDER — ONDANSETRON HCL 4 MG/2ML IJ SOLN
4.0000 mg | Freq: Three times a day (TID) | INTRAMUSCULAR | Status: DC | PRN
  Administered 2018-12-26 – 2018-12-27 (×2): 4 mg via INTRAVENOUS
  Filled 2018-12-26 (×3): qty 2

## 2018-12-26 MED ORDER — SODIUM CHLORIDE 0.9 % IV SOLN: 100.0000 mL | INTRAVENOUS | Status: DC | PRN

## 2018-12-26 MED ORDER — SODIUM CHLORIDE 0.9% IV SOLUTION
Freq: Once | INTRAVENOUS | Status: AC
  Administered 2018-12-26: 250 mL via INTRAVENOUS

## 2018-12-26 MED ORDER — POTASSIUM CHLORIDE CRYS ER 20 MEQ PO TBCR
40.0000 meq | EXTENDED_RELEASE_TABLET | Freq: Once | ORAL | Status: AC
  Administered 2018-12-26: 40 meq via ORAL
  Filled 2018-12-26: qty 2

## 2018-12-26 NOTE — Progress Notes (Signed)
CRITICAL VALUE ALERT  Critical Value:  Hgb 6.5  Date & Time Notied:  12/26/2018  Provider Notified: Dr. Loleta Books  Orders Received/Actions taken: see MAR

## 2018-12-26 NOTE — Progress Notes (Signed)
[facility] Progress Note   Subjective:   Had 2 hr HD last PM. Says nausea and dyspnea improved.  Still with some abd cramps and loose stools via rectal tube now.  No UOP documented but he tells me he filled 50% urinal.  No new complaints.    Objective Vitals:   12/26/18 0455 12/26/18 0500 12/26/18 0630 12/26/18 0655  BP:   (!) 110/57 108/60  Pulse:      Resp:   14 17  Temp: 99.2 F (37.3 C)     TempSrc: Oral     SpO2:      Weight:  54.9 kg    Height:       Physical Exam General: lying comfortably in bed Heart:  Tachycardic to low 100s, no rub Lungs: normal WOB, no rales Abdomen: soft, inc BS, mild TTP Extremities: no edema Neuro: tremor of hands (he notes chronic) Dialysis Access: L FA AVG with thrill  Additional Objective Labs: Basic Metabolic Panel: Recent Labs  Lab 12/25/18 0843 12/25/18 1340 12/25/18 2106  NA 140 143 140  K 4.0 4.5 3.3*  CL 123* 127* 120*  CO2 <7* <7* <7*  GLUCOSE 180* 189* 192*  BUN 120* 120* 112*  CREATININE 10.71* 10.75* 10.30*  CALCIUM 7.3* 7.3* 7.2*   Liver Function Tests: Recent Labs  Lab 12/24/18 1804  AST 9*  ALT 12  ALKPHOS 113  BILITOT 0.3  PROT 7.3  ALBUMIN 4.5   Recent Labs  Lab 12/24/18 1804  LIPASE 768*   CBC: Recent Labs  Lab 12/24/18 1801 12/24/18 1804 12/25/18 0110  WBC  --  15.6* 17.7*  HGB 12.2* 10.9* 9.1*  HCT 36.0* 34.4* 28.5*  MCV  --  84.9 83.3  PLT  --  212 167   Blood Culture    Component Value Date/Time   SDES BLOOD SITE NOT SPECIFIED 12/24/2018 1804   SPECREQUEST  12/24/2018 1804    BOTTLES DRAWN AEROBIC AND ANAEROBIC Blood Culture adequate volume   CULT  12/24/2018 1804    NO GROWTH < 24 HOURS Performed at [facility] [address] [place], [place] [address]    REPTSTATUS PENDING 12/24/2018 1804    Cardiac Enzymes: Recent Labs  Lab 12/24/18 1804 12/25/18 0110 12/25/18 0455  CKTOTAL  --  58  --   TROPONINI <0.03 <0.03 <0.03   CBG: Recent Labs  Lab  12/25/18 1718 12/25/18 1841 12/25/18 2050 12/26/18 0301 12/26/18 0454  GLUCAP 177* 171* 158* 108* 95   Iron Studies:  Recent Labs    12/26/18 0046  IRON 79  TIBC 151*  FERRITIN 701*   @[email or web address] @ Studies/Results: Dg Chest Port 1 View  Result Date: 12/24/2018 CLINICAL DATA:  Tachypnea EXAM: PORTABLE CHEST 1 VIEW COMPARISON:  01/18/2018 FINDINGS: The heart size and mediastinal contours are within normal limits. Both lungs are clear. The visualized skeletal structures are unremarkable. IMPRESSION: No acute abnormality of the lungs in AP portable projection. Electronically Signed   By: Eddie Candle M.D.   On: 12/24/2018 18:41   Ct Renal Stone Study  Result Date: 12/25/2018 CLINICAL DATA:  Flank pain, history of renal transplant EXAM: CT ABDOMEN AND PELVIS WITHOUT CONTRAST TECHNIQUE: Multidetector CT imaging of the abdomen and pelvis was performed following the standard protocol without IV contrast. COMPARISON:  None. FINDINGS: Examination of the abdomen pelvis is generally limited by motion artifact throughout. Lower chest: No acute abnormality. Hepatobiliary: No focal liver abnormality is seen. The gallbladder is mildly distended and may  contain dependent sludge; evaluation is particularly limited by breath motion artifact through this level. Pancreas: Unremarkable. No pancreatic ductal dilatation or surrounding inflammatory changes. Spleen: Normal in size without focal abnormality. Adrenals/Urinary Tract: Adrenal glands are unremarkable. 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 No hydronephrosis or ureteral calculus. 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 Bladder is unremarkable. Stomach/Bowel: Stomach is within normal limits. Appendix appears normal. The colon is fluid-filled to the rectum. Vascular/Lymphatic: No significant vascular  findings are present. No enlarged abdominal or pelvic lymph nodes. Reproductive: No mass or other abnormality. Other: No abdominal wall hernia or abnormality. No abdominopelvic ascites. Musculoskeletal: No acute or significant osseous findings. IMPRESSION: 1. Examination of the abdomen and pelvis is generally limited by motion artifact throughout. Within this limitation, no definite non-contrast CT findings to explain lower abdominal pain. 2. The gallbladder is mildly distended and may contain dependent sludge; evaluation is particularly limited by breath motion artifact through this level. 3. 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 No hydronephrosis or ureteral calculus. 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 4. The colon is fluid-filled to the rectum, in keeping with diarrheal illness. Electronically Signed   By: Eddie Candle M.D.   On: 12/25/2018 08:43   Medications: .  sodium bicarbonate infusion 1/4 NS 1000 mL 125 mL/hr at 12/25/18 1705   . chlorhexidine  15 mL Mouth Rinse BID  . Chlorhexidine Gluconate Cloth  6 each Topical Q0600  . heparin  5,000 Units Subcutaneous Q8H  . insulin aspart  0-15 Units Subcutaneous Q4H  . insulin glargine  10 Units Subcutaneous Daily  . mouth rinse  15 mL Mouth Rinse q12n4p  . mycophenolate  720 mg Oral BID  . tacrolimus  6 mg Oral Daily   And  . tacrolimus  5 mg Oral QHS     Assessment/Plan: The patient is a 48 y.o. year old male patient with a past medical history significant for end-stage renal disease status post renal transplantation, type 2 diabetes, chronic anemia, hyperlipidemia, and hypertension who presented to the emergency department with increasing shortness of breath for approximately 3 weeks with associated nausea and vomiting.  1.  End-stage renal disease status post renal  transplantation.  His most recent office visit to [facility] transplant was in 11-10-17.  He received a deceased donor renal transplant on 12/26/2013.  100% PRA, KDPII greater than 85.  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  His baseline creatinine as documented back in 2017-11-10 was about 2-3.  He is on maintenance immunosuppression with tacrolimus 6 in the morning and 5 at night, and Myfortic 4 tabs twice daily.  He was previously on prednisone, but he reports that was discontinued.  He has a random tacrolimus dose pending, will plan to check AM trough on 5/13 and 5/14.    2.  Acute kidney injury.  Given his symptoms, it certainly sounds like more progression of underlying chronic kidney disease.  He has had 3 weeks worth of symptoms.  His creatinine was over 10 and presentation with uremic symptoms.  His symptoms have improved with 2h HD.  Ordered AM labs and will f/u.  I suspect he will need further dialysis, the  timing of which (this afternoon vs tomorrow) can be dictated by labs.  Considering utility of renal biopsy.    3.  Non-anion gap metabolic acidosis.  This was profound, with a bicarbonate level of less than 7.  On bicarb gtt currently which I would continue for now.  Expect improving after HD yesterday, will f/u labs.  If still profoundly low will dialyze again this afternoon.   4.  Diabetic ketoacidosis.  I am not clear that this is the cause of his acidosis, as he appears to be non-gapped and has no ketones in his urine.  Blood sugar has improved and is now normal.   5.  Secondary hyperparathyroidism.  PTH is within goal for CKD stage.  Phosphorus is 5.4.  We will continue to monitor.  6.  Anemia of chronic kidney disease. Iron replete. Hb 12.2 on presentation, now 9.1, expect dilutional effect.  CTM.    Jannifer Hick MD 12/26/2018, 7:08 AM  [facility] Pager: [phone]

## 2018-12-26 NOTE — Progress Notes (Addendum)
Inpatient Diabetes Program Recommendations  AACE/ADA: New Consensus Statement on Inpatient Glycemic Control (2015)  Target Ranges:  Prepandial:   less than 140 mg/dL      Peak postprandial:   less than 180 mg/dL (1-2 hours)      Critically ill patients:  140 - 180 mg/dL   Lab Results  Component Value Date   GLUCAP 90 12/26/2018   HGBA1C 7.2 (H) 12/25/2018    Results for [name], [name] (MRN [id number]) as of 12/26/2018 17:58  Ref. Range 12/25/2018 20:50 12/26/2018 03:01 12/26/2018 04:54 12/26/2018 07:52 12/26/2018 11:56 12/26/2018 16:52  Glucose-Capillary Latest Ref Range: 70 - 99 mg/dL 158 (H) 108 (H) 95 92 130 (H) 90     Current orders for Inpatient glycemic control: Lantus 10 units daily                                                                          Novolog (0-15 units) Q4 hours    Inpatient Diabetes Program Recommendations:     MD please consider reducing Novolog correction to SENSITIVE (0-9 units) TID AC      Update - spoke with patient regarding his medications (being out of insulin prior to this DKA admission). He states he uses mail order and they arrived the morning he came to the hospital. Patient told me had not taken his insulin for "days" leading up to admission (not the 2 or 4 weeks he had stated in prior information).  He states Sherrie Mustache NP is who orders his diabetes medications and follows his DM. He was not sure about his insulin dosages  but said I can call his wife and ask her. Will call her in AM and clarify insulin amounts.    Thanks.  -- Will follow during hospitalization.--  Jonna Clark RN, MSN Diabetes Coordinator Inpatient Glycemic Control Team Team Pager: [phone] (8am-5pm)

## 2018-12-26 NOTE — Progress Notes (Addendum)
PROGRESS NOTE    [name]  ZWC:[id number] DOB: 09-27-1970 DOA: 12/24/2018 PCP: [name], NP      Brief Narrative:  Mr. Dimmer is a 48 y.o. M with ESRD hx renal DDT, DM, and HTN who presented with gradually worsening SOB and swelling.  In ER, acidotic, Cr >6 mg/dL.       Assessment & Plan:  Metabolic acidosis From acute on chronic renal failure.  Improved post-HD yesterday. -HD per Nephrology   Diabetes with hypergylcemia Hyperglycemia completely resolved. -Continue Lantus -Continue SSI corrections  Acute on chronic renal failure DDKT at [facility] Mar 2019, second transplant Hx transplant thrombosis -Consult Nephrology, appreciate expert cares -Continue Myfortic, Prograf -Resume aspirin -Hold dapsone for now  Hypokalemia -Supplement orally now -HD pending  Anemia of chronic kidney disease Acutely lower, no clinical bleeding, have to assume this is dilutional. -Transfuse 1 unit now -Trend Hgb -Transfusion threshold 7 g/dL  Prolonged QT interval Resolved on ECG this am.  Thrombocytopenia New, mild, no bleeding.  Probably also dilutional.   -Trend Platelets, continue heparin for now  SARS-CoV-2 testing was obtained for screening purposes.  COVID ruled out.       MDM and disposition: The below labs and imaging reports were reviewed and summarized above.  Medication management as above.  The patient was admitted with anemia, acidosis, worsening renal failure.    This is an acute illness that poses a threat to bodily function. An extensive number of diagnoses or treatment options were managed.     DVT prophylaxis: Heparin Code Status: FULL Family Communication: None    Consultants:   Nephrology  Procedures:   HD  Antimicrobials:   None    Subjective: Feeling much better after HD.  No melena, hematochezia, vomiting.  No chest pain, back pain, no dyspnea, no cough.  Objective: Vitals:   12/26/18 1136 12/26/18 1403 12/26/18  1453 12/26/18 1621  BP: (!) 109/55 110/63 (!) 106/58 (!) 110/55  Pulse: 93 82 75 78  Resp: 18 15 14 13   Temp: 98.9 F (37.2 C) 98.6 F (37 C) 98.5 F (36.9 C) 98.5 F (36.9 C)  TempSrc: Oral Oral Oral Oral  SpO2: 100% 100% 100% 100%  Weight:      Height:        Intake/Output Summary (Last 24 hours) at 12/26/2018 1715 Last data filed at 12/26/2018 1530 Gross per 24 hour  Intake 2904.46 ml  Output -16 ml  Net 2920.46 ml   Filed Weights   12/25/18 1130 12/26/18 0004 12/26/18 0500  Weight: 56.1 kg 53.8 kg 54.9 kg    Examination: General appearance:  adult male, alert and in no acute distress.   Appears tired. HEENT: Anicteric, conjunctiva pink, lids and lashes normal. No nasal deformity, discharge, epistaxis.  Lips moist.   Skin: Warm and dry.   No suspicious rashes or lesions. Cardiac: RRR, nl S1-S2, no murmurs appreciated.  Capillary refill is brisk.  JVP not visible.  No LE edema.  Radial pulses 2+ and symmetric. Respiratory: Normal respiratory rate and rhythm.  CTAB without rales or wheezes. Abdomen: Abdomen soft.  No TTP. No ascites, distension, hepatosplenomegaly.   MSK: No deformities or effusions. Neuro: Awake and alert.  EOMI, moves all extremities. Speech fluent.    Psych: Sensorium intact and responding to questions, attention normal. Affect normal.  Judgment and insight appear normal.    Data Reviewed: I have personally reviewed following labs and imaging studies:  CBC: Recent Labs  Lab 12/24/18 1801 12/24/18 1804  12/25/18 0110 12/26/18 0754  WBC  --  15.6* 17.7* 6.8  HGB 12.2* 10.9* 9.1* 6.5*  HCT 36.0* 34.4* 28.5* 18.7*  MCV  --  84.9 83.3 77.6*  PLT  --  212 167 95*   Basic Metabolic Panel: Recent Labs  Lab 12/25/18 0110 12/25/18 0455 12/25/18 0843 12/25/18 1340 12/25/18 2106 12/26/18 0754  NA 137 140 140 143 140 142  K 4.1 4.1 4.0 4.5 3.3* 2.1*  CL 117* 121* 123* 127* 120* 113*  CO2 <7* <7* <7* <7* <7* 17*  GLUCOSE 244* 112* 180* 189* 192*  99  BUN 122* 128* 120* 120* 112* 68*  CREATININE 11.90* 11.38* 10.71* 10.75* 10.30* 7.19*  CALCIUM 7.9* 7.8* 7.3* 7.3* 7.2* 7.2*  MG 1.8  --   --  1.5*  --   --   PHOS  --   --   --   --   --  2.7   GFR: Estimated Creatinine Clearance: 9.5 mL/min (A) (by C-G formula based on SCr of 7.19 mg/dL (H)). Liver Function Tests: Recent Labs  Lab 12/24/18 1804 12/26/18 0754  AST 9*  --   ALT 12  --   ALKPHOS 113  --   BILITOT 0.3  --   PROT 7.3  --   ALBUMIN 4.5 3.0*   Recent Labs  Lab 12/24/18 1804  LIPASE 768*   No results for input(s): AMMONIA in the last 168 hours. Coagulation Profile: Recent Labs  Lab 12/26/18 0754  INR 1.2   Cardiac Enzymes: Recent Labs  Lab 12/24/18 1804 12/25/18 0110 12/25/18 0455  CKTOTAL  --  58  --   TROPONINI <0.03 <0.03 <0.03   BNP (last 3 results) No results for input(s): PROBNP in the last 8760 hours. HbA1C: Recent Labs    12/25/18 0110  HGBA1C 7.2*   CBG: Recent Labs  Lab 12/26/18 0301 12/26/18 0454 12/26/18 0752 12/26/18 1156 12/26/18 1652  GLUCAP 108* 95 92 130* 90   Lipid Profile: Recent Labs    12/25/18 0841  CHOL 72  HDL 22*  LDLCALC 34  TRIG 80  CHOLHDL 3.3   Thyroid Function Tests: No results for input(s): TSH, T4TOTAL, FREET4, T3FREE, THYROIDAB in the last 72 hours. Anemia Panel: Recent Labs    12/25/18 1904 12/26/18 0046  VITAMINB12 197  --   FOLATE 22.7  --   FERRITIN 663* 701*  TIBC 150* 151*  IRON 83 79  RETICCTPCT 1.4  --    Urine analysis:    Component Value Date/Time   COLORURINE YELLOW 12/25/2018 0200   APPEARANCEUR CLOUDY (A) 12/25/2018 0200   LABSPEC 1.012 12/25/2018 0200   PHURINE 5.0 12/25/2018 0200   GLUCOSEU NEGATIVE 12/25/2018 0200   HGBUR MODERATE (A) 12/25/2018 0200   BILIRUBINUR NEGATIVE 12/25/2018 0200   KETONESUR NEGATIVE 12/25/2018 0200   PROTEINUR 30 (A) 12/25/2018 0200   UROBILINOGEN 0.2 12/23/2008 1845   NITRITE NEGATIVE 12/25/2018 0200   LEUKOCYTESUR NEGATIVE  12/25/2018 0200   Sepsis Labs: @LABRCNTIP (procalcitonin:4,lacticacidven:4)  ) Recent Results (from the past 240 hour(s))  Culture, blood (single)     Status: None (Preliminary result)   Collection Time: 12/24/18  6:04 PM  Result Value Ref Range Status   Specimen Description BLOOD SITE NOT SPECIFIED  Final   Special Requests   Final    BOTTLES DRAWN AEROBIC AND ANAEROBIC Blood Culture adequate volume   Culture   Final    NO GROWTH 2 DAYS Performed at [facility], [place]  [address] [place], [place] [address]    Report Status PENDING  Incomplete  SARS Coronavirus 2 (CEPHEID - Performed in [place] hospital lab), Hosp Order     Status: None   Collection Time: 12/24/18  6:19 PM  Result Value Ref Range Status   SARS Coronavirus 2 NEGATIVE NEGATIVE Final    Comment: (NOTE) If result is NEGATIVE SARS-CoV-2 target nucleic acids are NOT DETECTED. The SARS-CoV-2 RNA is generally detectable in upper and lower  respiratory specimens during the acute phase of infection. The lowest  concentration of SARS-CoV-2 viral copies this assay can detect is 250  copies / mL. A negative result does not preclude SARS-CoV-2 infection  and should not be used as the sole basis for treatment or other  patient management decisions.  A negative result may occur with  improper specimen collection / handling, submission of specimen other  than nasopharyngeal swab, presence of viral mutation(s) within the  areas targeted by this assay, and inadequate number of viral copies  (<250 copies / mL). A negative result must be combined with clinical  observations, patient history, and epidemiological information. If result is POSITIVE SARS-CoV-2 target nucleic acids are DETECTED. The SARS-CoV-2 RNA is generally detectable in upper and lower  respiratory specimens dur ing the acute phase of infection.  Positive  results are indicative of active infection with SARS-CoV-2.  Clinical  correlation with patient  history and other diagnostic information is  necessary to determine patient infection status.  Positive results do  not rule out bacterial infection or co-infection with other viruses. If result is PRESUMPTIVE POSTIVE SARS-CoV-2 nucleic acids MAY BE PRESENT.   A presumptive positive result was obtained on the submitted specimen  and confirmed on repeat testing.  While 2019 novel coronavirus  (SARS-CoV-2) nucleic acids may be present in the submitted sample  additional confirmatory testing may be necessary for epidemiological  and / or clinical management purposes  to differentiate between  SARS-CoV-2 and other [place]ovirus currently known to infect humans.  If clinically indicated additional testing with an alternate test  methodology [phone]) is advised. The SARS-CoV-2 RNA is generally  detectable in upper and lower respiratory sp ecimens during the acute  phase of infection. The expected result is Negative. Fact Sheet for Patients:  StrictlyIdeas.no Fact Sheet for Healthcare Providers: BankingDealers.co.za This test is not yet approved or cleared by the [place] FDA and has been authorized for detection and/or diagnosis of SARS-CoV-2 by FDA under an Emergency Use Authorization (EUA).  This EUA will remain in effect (meaning this test can be used) for the duration of the COVID-19 declaration under Section 564(b)(1) of the Act, 21 U.S.C. section 360bbb-3(b)(1), unless the authorization is terminated or revoked sooner. Performed at [facility], [place] [address] [place], [place] [address]   C difficile quick scan w PCR reflex     Status: None   Collection Time: 12/25/18  7:20 PM  Result Value Ref Range Status   C Diff antigen NEGATIVE NEGATIVE Final   C Diff toxin NEGATIVE NEGATIVE Final   C Diff interpretation No C. difficile detected.  Final    Comment: Performed at [facility], [place] [address], [place], [place]  [address]         Radiology Studies: Dg Chest Port 1 View  Result Date: 12/24/2018 CLINICAL DATA:  Tachypnea EXAM: PORTABLE CHEST 1 VIEW COMPARISON:  01/18/2018 FINDINGS: The heart size and mediastinal contours are within normal limits. Both lungs are clear. The visualized  skeletal structures are unremarkable. IMPRESSION: No acute abnormality of the lungs in AP portable projection. Electronically Signed   By: Eddie Candle M.D.   On: 12/24/2018 18:41   Ct Renal Stone Study  Result Date: 12/25/2018 CLINICAL DATA:  Flank pain, history of renal transplant EXAM: CT ABDOMEN AND PELVIS WITHOUT CONTRAST TECHNIQUE: Multidetector CT imaging of the abdomen and pelvis was performed following the standard protocol without IV contrast. COMPARISON:  None. FINDINGS: Examination of the abdomen pelvis is generally limited by motion artifact throughout. Lower chest: No acute abnormality. Hepatobiliary: No focal liver abnormality is seen. The gallbladder is mildly distended and may contain dependent sludge; evaluation is particularly limited by breath motion artifact through this level. Pancreas: Unremarkable. No pancreatic ductal dilatation or surrounding inflammatory changes. Spleen: Normal in size without focal abnormality. Adrenals/Urinary Tract: Adrenal glands are unremarkable. 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 No hydronephrosis or ureteral calculus. 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 Bladder is unremarkable. Stomach/Bowel: Stomach is within normal limits. Appendix appears normal. The colon is fluid-filled to the rectum. Vascular/Lymphatic: No significant vascular findings are present. No enlarged abdominal or pelvic lymph nodes. Reproductive: No mass or other abnormality. Other: No abdominal wall hernia or abnormality. No abdominopelvic ascites.  Musculoskeletal: No acute or significant osseous findings. IMPRESSION: 1. Examination of the abdomen and pelvis is generally limited by motion artifact throughout. Within this limitation, no definite non-contrast CT findings to explain lower abdominal pain. 2. The gallbladder is mildly distended and may contain dependent sludge; evaluation is particularly limited by breath motion artifact through this level. 3. 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 No hydronephrosis or ureteral calculus. 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 4. The colon is fluid-filled to the rectum, in keeping with diarrheal illness. Electronically Signed   By: Eddie Candle M.D.   On: 12/25/2018 08:43        Scheduled Meds:  chlorhexidine  15 mL Mouth Rinse BID   Chlorhexidine Gluconate Cloth  6 each Topical Q0600   heparin  5,000 Units Subcutaneous Q8H   insulin aspart  0-15 Units Subcutaneous Q4H   insulin glargine  10 Units Subcutaneous Daily   mouth rinse  15 mL Mouth Rinse q12n4p   mycophenolate  720 mg Oral BID   tacrolimus  6 mg Oral Daily   And   tacrolimus  5 mg Oral QHS   Continuous Infusions:   sodium bicarbonate infusion 1/4 NS 1000 mL 125 mL/hr at 12/26/18 1530     LOS: 2 days    Time spent: 35 minutes    Edwin Dada, MD [facility] 12/26/2018, 5:15 PM     Please page through [place]:  [email or web address] Password [email or web address] If 7PM-7AM, please contact night-coverage

## 2018-12-26 NOTE — Progress Notes (Signed)
CRITICAL VALUE ALERT  Critical Value:  K 2.1  Date & Time Notied:  12/26/2018; 08:45  Provider Notified: Dr. Loleta Books  Orders Received/Actions taken:

## 2018-12-27 LAB — RENAL FUNCTION PANEL
Albumin: 2.9 g/dL — ABNORMAL LOW (ref 3.5–5.0)
Anion gap: 12 (ref 5–15)
BUN: 67 mg/dL — ABNORMAL HIGH (ref 6–20)
CO2: 24 mmol/L (ref 22–32)
Calcium: 6.6 mg/dL — ABNORMAL LOW (ref 8.9–10.3)
Chloride: 105 mmol/L (ref 98–111)
Creatinine, Ser: 7.31 mg/dL — ABNORMAL HIGH (ref 0.61–1.24)
GFR calc Af Amer: 9 mL/min — ABNORMAL LOW (ref >60)
GFR calc non Af Amer: 8 mL/min — ABNORMAL LOW (ref >60)
Glucose, Bld: 131 mg/dL — ABNORMAL HIGH (ref 70–99)
Phosphorus: 2.9 mg/dL (ref 2.5–4.6)
Potassium: 2 mmol/L — CL (ref 3.5–5.1)
Sodium: 141 mmol/L (ref 135–145)

## 2018-12-27 LAB — GLUCOSE, CAPILLARY
Glucose-Capillary: 99 mg/dL (ref 70–99)
Glucose-Capillary: 138 mg/dL — ABNORMAL HIGH (ref 70–99)
Glucose-Capillary: 80 mg/dL (ref 70–99)
Glucose-Capillary: 127 mg/dL — ABNORMAL HIGH (ref 70–99)

## 2018-12-27 LAB — TYPE AND SCREEN
ABO/RH(D): O POS
Antibody Screen: NEGATIVE
Unit division: 0

## 2018-12-27 LAB — CBC
HCT: 23.6 % — ABNORMAL LOW (ref 39.0–52.0)
Hemoglobin: 8.2 g/dL — ABNORMAL LOW (ref 13.0–17.0)
MCH: 26.7 pg (ref 26.0–34.0)
MCHC: 34.7 g/dL (ref 30.0–36.0)
MCV: 76.9 fL — ABNORMAL LOW (ref 80.0–100.0)
Platelets: 78 10*3/uL — ABNORMAL LOW (ref 150–400)
RBC: 3.07 MIL/uL — ABNORMAL LOW (ref 4.22–5.81)
RDW: 15.1 % (ref 11.5–15.5)
WBC: 8.9 10*3/uL (ref 4.0–10.5)
nRBC: 0 % (ref 0.0–0.2)

## 2018-12-27 LAB — HEPATITIS B SURFACE ANTIGEN: Hepatitis B Surface Ag: NEGATIVE

## 2018-12-27 LAB — HAPTOGLOBIN: Haptoglobin: 94 mg/dL (ref 23–355)

## 2018-12-27 LAB — MAGNESIUM: Magnesium: 1.5 mg/dL — ABNORMAL LOW (ref 1.7–2.4)

## 2018-12-27 LAB — BPAM RBC
Blood Product Expiration Date: 202005152359
ISSUE DATE / TIME: 202005121114
Unit Type and Rh: 5100

## 2018-12-27 LAB — HEPATITIS B SURFACE ANTIBODY,QUALITATIVE: Hep B S Ab: NONREACTIVE

## 2018-12-27 MED ORDER — POTASSIUM CHLORIDE CRYS ER 20 MEQ PO TBCR
40.0000 meq | EXTENDED_RELEASE_TABLET | Freq: Once | ORAL | Status: AC
  Administered 2018-12-27: 40 meq via ORAL
  Filled 2018-12-27: qty 2

## 2018-12-27 MED ORDER — PROMETHAZINE HCL 25 MG PO TABS
25.0000 mg | ORAL_TABLET | Freq: Two times a day (BID) | ORAL | Status: DC | PRN
  Administered 2019-01-02: 25 mg via ORAL
  Filled 2018-12-27: qty 1

## 2018-12-27 MED ORDER — POTASSIUM CHLORIDE 20 MEQ PO PACK
40.0000 meq | PACK | Freq: Three times a day (TID) | ORAL | Status: DC
  Filled 2018-12-27: qty 2

## 2018-12-27 MED ORDER — CHLORHEXIDINE GLUCONATE CLOTH 2 % EX PADS
6.0000 | MEDICATED_PAD | Freq: Every day | CUTANEOUS | Status: DC
  Administered 2018-12-27 – 2018-12-29 (×3): 6 via TOPICAL

## 2018-12-27 MED ORDER — POTASSIUM CHLORIDE 10 MEQ/100ML IV SOLN
10.0000 meq | INTRAVENOUS | Status: AC
  Administered 2018-12-27 (×4): 10 meq via INTRAVENOUS
  Filled 2018-12-27 (×2): qty 100

## 2018-12-27 NOTE — Progress Notes (Signed)
IR consulted for possible declotting procedure. Patient unable to dialyze today due to difficulty with LUA AVG.  Orders placed for possible procedure in IR tomorrow as schedule allows.   Brynda Greathouse, MS RD PA-C 4:48 PM

## 2018-12-27 NOTE — Progress Notes (Signed)
Critical Value reported K + is 2.0.  Paged overnight coverage with value.

## 2018-12-27 NOTE — Progress Notes (Signed)
Patient going to dialysis. Alert and oriented, no acute distress noted, no complaints. Will continue to monitor.

## 2018-12-27 NOTE — Progress Notes (Signed)
[facility] Progress Note   Subjective:   No UOP documented; pt documents ~1/2 urinal full yesterday and another 267mL this AM.  Still with nausea, some intermittent diarrhea but less volume.   Remained on bicarb gtt since admission.  No new symptoms. Requesting phenergan for efficacy over zofran.   Objective Vitals:   12/26/18 1453 12/26/18 1621 12/26/18 2027 12/27/18 0553  BP: (!) 106/58 (!) 110/55  123/65  Pulse: 75 78  92  Resp: 14 13  18   Temp: 98.5 F (36.9 C) 98.5 F (36.9 C) 98.6 F (37 C) 98.5 F (36.9 C)  TempSrc: Oral Oral  Oral  SpO2: 100% 100%  98%  Weight:    57.2 kg  Height:       Physical Exam General: lying comfortably in bed Heart:  RRR, no rub Lungs: normal WOB, no rales Abdomen: soft, NABS, mild TTP Extremities: no edema Neuro: tremor of hands (he notes chronic), no asterixis Dialysis Access: L FA AVG with thrill  Additional Objective Labs: Basic Metabolic Panel: Recent Labs  Lab 12/25/18 2106 12/26/18 0754 12/27/18 0252  NA 140 142 141  K 3.3* 2.1* 2.0*  CL 120* 113* 105  CO2 <7* 17* 24  GLUCOSE 192* 99 131*  BUN 112* 68* 67*  CREATININE 10.30* 7.19* 7.31*  CALCIUM 7.2* 7.2* 6.6*  PHOS  --  2.7 2.9   Liver Function Tests: Recent Labs  Lab 12/24/18 1804 12/26/18 0754 12/27/18 0252  AST 9*  --   --   ALT 12  --   --   ALKPHOS 113  --   --   BILITOT 0.3  --   --   PROT 7.3  --   --   ALBUMIN 4.5 3.0* 2.9*   Recent Labs  Lab 12/24/18 1804  LIPASE 768*   CBC: Recent Labs  Lab 12/24/18 1804 12/25/18 0110 12/26/18 0754 12/26/18 1704 12/27/18 0252  WBC 15.6* 17.7* 6.8  --  8.9  HGB 10.9* 9.1* 6.5* 8.0* 8.2*  HCT 34.4* 28.5* 18.7* 22.9* 23.6*  MCV 84.9 83.3 77.6*  --  76.9*  PLT 212 167 95*  --  78*   Blood Culture    Component Value Date/Time   SDES BLOOD SITE NOT SPECIFIED 12/24/2018 1804   SPECREQUEST  12/24/2018 1804    BOTTLES DRAWN AEROBIC AND ANAEROBIC Blood Culture adequate volume   CULT   12/24/2018 1804    NO GROWTH 2 DAYS Performed at [facility], [place] [address] [place], [place] [address]    REPTSTATUS PENDING 12/24/2018 1804    Cardiac Enzymes: Recent Labs  Lab 12/24/18 1804 12/25/18 0110 12/25/18 0455  CKTOTAL  --  58  --   TROPONINI <0.03 <0.03 <0.03   CBG: Recent Labs  Lab 12/26/18 0752 12/26/18 1156 12/26/18 1652 12/26/18 2009 12/27/18 0753  GLUCAP 92 130* 90 101* 99   Iron Studies:  Recent Labs    12/26/18 0046  IRON 79  TIBC 151*  FERRITIN 701*   @lablastinr3 @ Studies/Results: No results found. Medications: . potassium chloride 10 mEq (12/27/18 0734)  .  sodium bicarbonate infusion 1/4 NS 1000 mL 125 mL/hr at 12/26/18 1530   . chlorhexidine  15 mL Mouth Rinse BID  . Chlorhexidine Gluconate Cloth  6 each Topical Q0600  . heparin  5,000 Units Subcutaneous Q8H  . insulin aspart  0-5 Units Subcutaneous QHS  . insulin aspart  0-9 Units Subcutaneous TID WC  . insulin glargine  10 Units Subcutaneous  Daily  . mouth rinse  15 mL Mouth Rinse q12n4p  . mycophenolate  720 mg Oral BID  . tacrolimus  6 mg Oral Daily   And  . tacrolimus  5 mg Oral QHS     Assessment/Plan: The patient is a 48 y.o. year old male patient with a past medical history significant for end-stage renal disease status post renal transplantation, type 2 diabetes, chronic anemia, hyperlipidemia, and hypertension who presented to the emergency department with increasing shortness of breath for approximately 3 weeks with associated nausea and vomiting.  1.  End-stage renal disease status post renal transplantation.  His most recent office visit to [facility] transplant was in 12/04/2017.  He received a deceased donor renal transplant on 12/26/2013.  100% PRA, KDPII greater than 85.  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  His baseline creatinine as  documented back in 12-04-2017 was about 2-3.  He is on maintenance immunosuppression with tacrolimus 6 in the morning and 5 at night, and Myfortic 4 tabs twice daily.  He was previously on prednisone, but he reports that was discontinued.  He has a random tacrolimus dose pending, check trough this AM and tomorrow AM.   2.  Acute kidney injury.  Unclear if this is acute GI issue leading to hypovolemia and AKI versus progression of underlying disease and uremia.  Cr 13 on admission, improved to 7.2 with 2 hrs of HD + fluids, stable at 7.3 this AM.  Another 3hr HD this morning to see if this helps nausea, ? Uremic symptom.  Considering utility of renal biopsy if lack of improvement.    3.  Non-anion gap metabolic acidosis.  This was profound, with a bicarbonate level of less than 7.  Improving with bicarb gtt and HD.  Stop bicarb gtt today.  May need addition MIVF if unable to keep up oral intake, CTM.   4.  Diabetic ketoacidosis.  Resolved.  A1c is 7.2.   5.  Secondary hyperparathyroidism.  PTH is within goal for CKD stage.  Phosphorus is 5.4.  We will continue to monitor.  6.  Anemia of chronic kidney disease. Iron replete. Hb 12.2 on presentation, down into 6s.  Rec'd 1 u pRBC yesterday with appropriate increase.  Expect a lot of the drop was dilutional, no e/o bleeding.   7.  Thrombocytopenia:  Chronic issue.  Plt have trended down during admission but in fact are now at his baseline.  I think a lot of the change this admission was hemoconcentration on presentation.    8.  Hypokalemia:  Aggressive repletion. 4K dialysate today.   Jannifer Hick MD 12/27/2018, 8:51 AM  [facility] Pager: [phone]

## 2018-12-27 NOTE — Progress Notes (Signed)
PROGRESS NOTE        PATIENT DETAILS Name: [name] Age: 48 y.o. Sex: male Date of Birth: [birth year] Admit Date: 12/24/2018 Admitting Physician Rise Patience, MD MAU:[id number], [name], NP  Brief Narrative: Patient is a 48 y.o. male with history of renal transplant, DM-2, HTN, dyslipidemia-who presented with DKA, AKI on CKD stage III.  See below for further details.  Subjective: No nausea vomiting.  Denies any abdominal pain.  Lying comfortably in bed.  Assessment/Plan: DKA: Resolved-on admission required insulin infusion-has been transitioned to subcutaneous insulin.  Severe metabolic acidosis: Felt to be a combination of DKA, AKI.  Improved after resolution of DKA and HD.  AKI on CKD stage IV: History of renal transplantation-thought to have progression of underlying CKD-may be close to ESRD-nephrology following and directing HD care.  Not certain at this time whether patient is going to require HD on discharge.  Await further input from nephrology.  History of renal transplantation: Remains on maintenance immunosuppression with tacrolimus and Myfortic  Hypokalemia: Continue to replete and recheck-replace magnesium today as well.  Thrombocytopenia: Appears to be chronic-has had thrombocytopenia in 2019 as well-follow for now.  Anemia: Likely secondary to kidney disease-no evidence of blood loss-follow for now.  Insulin-dependent DM-2: CBG stable-continue Lantus 10 units and SSI.  Prolonged QTc interval: Resolved-probably secondary to hypokalemia  DVT Prophylaxis: Prophylactic Heparin-watch platelets while on SQ heparin  Code Status: Full code  Family Communication: None at bedside  Disposition Plan: Remain inpatient  Antimicrobial agents: Anti-infectives (From admission, onward)   None      Procedures: None  CONSULTS:  nephrology  Time spent: 25 minutes-Greater than 50% of this time was spent in counseling, explanation  of diagnosis, planning of further management, and coordination of care.  MEDICATIONS: Scheduled Meds: . chlorhexidine  15 mL Mouth Rinse BID  . Chlorhexidine Gluconate Cloth  6 each Topical Q0600  . Chlorhexidine Gluconate Cloth  6 each Topical Q0600  . heparin  5,000 Units Subcutaneous Q8H  . insulin aspart  0-5 Units Subcutaneous QHS  . insulin aspart  0-9 Units Subcutaneous TID WC  . insulin glargine  10 Units Subcutaneous Daily  . mouth rinse  15 mL Mouth Rinse q12n4p  . mycophenolate  720 mg Oral BID  . tacrolimus  6 mg Oral Daily   And  . tacrolimus  5 mg Oral QHS   Continuous Infusions: PRN Meds:.loperamide, phenol, promethazine   PHYSICAL EXAM: Vital signs: Vitals:   12/26/18 1453 12/26/18 1621 12/26/18 2027 12/27/18 0553  BP: (!) 106/58 (!) 110/55  123/65  Pulse: 75 78  92  Resp: 14 13  18   Temp: 98.5 F (36.9 C) 98.5 F (36.9 C) 98.6 F (37 C) 98.5 F (36.9 C)  TempSrc: Oral Oral  Oral  SpO2: 100% 100%  98%  Weight:    57.2 kg  Height:       Filed Weights   12/26/18 0004 12/26/18 0500 12/27/18 0553  Weight: 53.8 kg 54.9 kg 57.2 kg   Body mass index is 20.98 kg/m.   General appearance :Awake, alert, not in any distress.  HEENT: Atraumatic and Normocephalic Neck: supple, no JVD. No cervical lymphadenopathy. No thyromegaly Resp:Good air entry bilaterally, no added sounds  CVS: S1 S2 regular GI: Bowel sounds present, Non tender and not distended with no gaurding, rigidity or rebound.No organomegaly Extremities:  B/L Lower Ext shows no edema, both legs are warm to touch Neurology:  speech clear,Non focal, sensation is grossly intact. Musculoskeletal:No digital cyanosis Skin:No Rash, warm and dry Wounds:N/A  I have personally reviewed following labs and imaging studies  LABORATORY DATA: CBC: Recent Labs  Lab 12/24/18 1804 12/25/18 0110 12/26/18 0754 12/26/18 1704 12/27/18 0252  WBC 15.6* 17.7* 6.8  --  8.9  HGB 10.9* 9.1* 6.5* 8.0* 8.2*  HCT  34.4* 28.5* 18.7* 22.9* 23.6*  MCV 84.9 83.3 77.6*  --  76.9*  PLT 212 167 95*  --  78*    Basic Metabolic Panel: Recent Labs  Lab 12/25/18 0110  12/25/18 0843 12/25/18 1340 12/25/18 2106 12/26/18 0754 12/27/18 0252 12/27/18 0732  NA 137   < > 140 143 140 142 141  --   K 4.1   < > 4.0 4.5 3.3* 2.1* 2.0*  --   CL 117*   < > 123* 127* 120* 113* 105  --   CO2 <7*   < > <7* <7* <7* 17* 24  --   GLUCOSE 244*   < > 180* 189* 192* 99 131*  --   BUN 122*   < > 120* 120* 112* 68* 67*  --   CREATININE 11.90*   < > 10.71* 10.75* 10.30* 7.19* 7.31*  --   CALCIUM 7.9*   < > 7.3* 7.3* 7.2* 7.2* 6.6*  --   MG 1.8  --   --  1.5*  --   --   --  1.5*  PHOS  --   --   --   --   --  2.7 2.9  --    < > = values in this interval not displayed.    GFR: Estimated Creatinine Clearance: 9.8 mL/min (A) (by C-G formula based on SCr of 7.31 mg/dL (H)).  Liver Function Tests: Recent Labs  Lab 12/24/18 1804 12/26/18 0754 12/27/18 0252  AST 9*  --   --   ALT 12  --   --   ALKPHOS 113  --   --   BILITOT 0.3  --   --   PROT 7.3  --   --   ALBUMIN 4.5 3.0* 2.9*   Recent Labs  Lab 12/24/18 1804  LIPASE 768*   No results for input(s): AMMONIA in the last 168 hours.  Coagulation Profile: Recent Labs  Lab 12/26/18 0754  INR 1.2    Cardiac Enzymes: Recent Labs  Lab 12/24/18 1804 12/25/18 0110 12/25/18 0455  CKTOTAL  --  58  --   TROPONINI <0.03 <0.03 <0.03    BNP (last 3 results) No results for input(s): PROBNP in the last 8760 hours.  HbA1C: Recent Labs    12/25/18 0110  HGBA1C 7.2*    CBG: Recent Labs  Lab 12/26/18 0752 12/26/18 1156 12/26/18 1652 12/26/18 2009 12/27/18 0753  GLUCAP 92 130* 90 101* 99    Lipid Profile: Recent Labs    12/25/18 0841  CHOL 72  HDL 22*  LDLCALC 34  TRIG 80  CHOLHDL 3.3    Thyroid Function Tests: No results for input(s): TSH, T4TOTAL, FREET4, T3FREE, THYROIDAB in the last 72 hours.  Anemia Panel: Recent Labs    12/25/18  1904 12/26/18 0046  VITAMINB12 197  --   FOLATE 22.7  --   FERRITIN 663* 701*  TIBC 150* 151*  IRON 83 79  RETICCTPCT 1.4  --     Urine analysis:    Component Value  Date/Time   COLORURINE YELLOW 12/25/2018 0200   APPEARANCEUR CLOUDY (A) 12/25/2018 0200   LABSPEC 1.012 12/25/2018 0200   PHURINE 5.0 12/25/2018 0200   GLUCOSEU NEGATIVE 12/25/2018 0200   HGBUR MODERATE (A) 12/25/2018 0200   BILIRUBINUR NEGATIVE 12/25/2018 0200   KETONESUR NEGATIVE 12/25/2018 0200   PROTEINUR 30 (A) 12/25/2018 0200   UROBILINOGEN 0.2 12/23/2008 1845   NITRITE NEGATIVE 12/25/2018 0200   LEUKOCYTESUR NEGATIVE 12/25/2018 0200    Sepsis Labs: Lactic Acid, Venous    Component Value Date/Time   LATICACIDVEN 2.2 ([place]) 12/24/2018 1804    MICROBIOLOGY: Recent Results (from the past 240 hour(s))  Culture, blood (single)     Status: None (Preliminary result)   Collection Time: 12/24/18  6:04 PM  Result Value Ref Range Status   Specimen Description BLOOD SITE NOT SPECIFIED  Final   Special Requests   Final    BOTTLES DRAWN AEROBIC AND ANAEROBIC Blood Culture adequate volume   Culture   Final    NO GROWTH 2 DAYS Performed at [facility], [place] [address], [place], [place] [address]    Report Status PENDING  Incomplete  SARS Coronavirus 2 (CEPHEID - Performed in [place] hospital lab), Hosp Order     Status: None   Collection Time: 12/24/18  6:19 PM  Result Value Ref Range Status   SARS Coronavirus 2 NEGATIVE NEGATIVE Final    Comment: (NOTE) If result is NEGATIVE SARS-CoV-2 target nucleic acids are NOT DETECTED. The SARS-CoV-2 RNA is generally detectable in upper and lower  respiratory specimens during the acute phase of infection. The lowest  concentration of SARS-CoV-2 viral copies this assay can detect is 250  copies / mL. A negative result does not preclude SARS-CoV-2 infection  and should not be used as the sole basis for treatment or other  patient management decisions.  A  negative result may occur with  improper specimen collection / handling, submission of specimen other  than nasopharyngeal swab, presence of viral mutation(s) within the  areas targeted by this assay, and inadequate number of viral copies  (<250 copies / mL). A negative result must be combined with clinical  observations, patient history, and epidemiological information. If result is POSITIVE SARS-CoV-2 target nucleic acids are DETECTED. The SARS-CoV-2 RNA is generally detectable in upper and lower  respiratory specimens dur ing the acute phase of infection.  Positive  results are indicative of active infection with SARS-CoV-2.  Clinical  correlation with patient history and other diagnostic information is  necessary to determine patient infection status.  Positive results do  not rule out bacterial infection or co-infection with other viruses. If result is PRESUMPTIVE POSTIVE SARS-CoV-2 nucleic acids MAY BE PRESENT.   A presumptive positive result was obtained on the submitted specimen  and confirmed on repeat testing.  While 2019 novel coronavirus  (SARS-CoV-2) nucleic acids may be present in the submitted sample  additional confirmatory testing may be necessary for epidemiological  and / or clinical management purposes  to differentiate between  SARS-CoV-2 and other Sarbecovirus currently known to infect humans.  If clinically indicated additional testing with an alternate test  methodology [phone]) is advised. The SARS-CoV-2 RNA is generally  detectable in upper and lower respiratory sp ecimens during the acute  phase of infection. The expected result is Negative. Fact Sheet for Patients:  StrictlyIdeas.no Fact Sheet for Healthcare Providers: BankingDealers.co.za This test is not yet approved or cleared by the [place] FDA and has been authorized for detection  and/or diagnosis of SARS-CoV-2 by FDA under an Emergency Use  Authorization (EUA).  This EUA will remain in effect (meaning this test can be used) for the duration of the COVID-19 declaration under Section 564(b)(1) of the Act, 21 U.S.C. section 360bbb-3(b)(1), unless the authorization is terminated or revoked sooner. Performed at [facility], [place] [address], [place], [place] [address]   C difficile quick scan w PCR reflex     Status: None   Collection Time: 12/25/18  7:20 PM  Result Value Ref Range Status   C Diff antigen NEGATIVE NEGATIVE Final   C Diff toxin NEGATIVE NEGATIVE Final   C Diff interpretation No C. difficile detected.  Final    Comment: Performed at [facility], [place] [address] [place],  [address]    RADIOLOGY STUDIES/RESULTS: Dg Chest Port 1 View  Result Date: 12/24/2018 CLINICAL DATA:  Tachypnea EXAM: PORTABLE CHEST 1 VIEW COMPARISON:  01/18/2018 FINDINGS: The heart size and mediastinal contours are within normal limits. Both lungs are clear. The visualized skeletal structures are unremarkable. IMPRESSION: No acute abnormality of the lungs in AP portable projection. Electronically Signed   By: Eddie Candle M.D.   On: 12/24/2018 18:41   Ct Renal Stone Study  Result Date: 12/25/2018 CLINICAL DATA:  Flank pain, history of renal transplant EXAM: CT ABDOMEN AND PELVIS WITHOUT CONTRAST TECHNIQUE: Multidetector CT imaging of the abdomen and pelvis was performed following the standard protocol without IV contrast. COMPARISON:  None. FINDINGS: Examination of the abdomen pelvis is generally limited by motion artifact throughout. Lower chest: No acute abnormality. Hepatobiliary: No focal liver abnormality is seen. The gallbladder is mildly distended and may contain dependent sludge; evaluation is particularly limited by breath motion artifact through this level. Pancreas: Unremarkable. No pancreatic ductal dilatation or surrounding inflammatory changes. Spleen: Normal in size without focal abnormality. Adrenals/Urinary Tract:  Adrenal glands are unremarkable. 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 No hydronephrosis or ureteral calculus. 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 Bladder is unremarkable. Stomach/Bowel: Stomach is within normal limits. Appendix appears normal. The colon is fluid-filled to the rectum. Vascular/Lymphatic: No significant vascular findings are present. No enlarged abdominal or pelvic lymph nodes. Reproductive: No mass or other abnormality. Other: No abdominal wall hernia or abnormality. No abdominopelvic ascites. Musculoskeletal: No acute or significant osseous findings. IMPRESSION: 1. Examination of the abdomen and pelvis is generally limited by motion artifact throughout. Within this limitation, no definite non-contrast CT findings to explain lower abdominal pain. 2. The gallbladder is mildly distended and may contain dependent sludge; evaluation is particularly limited by breath motion artifact through this level. 3. 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 No hydronephrosis or ureteral calculus. 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 4. The colon is fluid-filled to the rectum, in keeping with diarrheal illness. Electronically Signed   By: Eddie Candle M.D.   On: 12/25/2018 08:43     LOS: 3 days   Oren Binet, MD  [facility]  If 7PM-7AM, please contact night-coverage  Please page via [email or web address]  Go to [email or web address] and use [facility]'s universal password to access. If you do not have the password, please contact the hospital operator.  Locate the [facility] provider you are looking for under [facility] and page to a number that you can  be directly reached. If you still  have difficulty reaching the provider, please page the [facility] (Director on Call) for the Hospitalists listed on amion for assistance.  12/27/2018, 9:56 AM

## 2018-12-27 NOTE — Progress Notes (Signed)
Patient back from dialysis. Alert and oriented, no complaints at this time. No treatment today due to a clot. Will continue to monitor.

## 2018-12-27 NOTE — Progress Notes (Addendum)
Inpatient Diabetes Program Recommendations  AACE/ADA: New Consensus Statement on Inpatient Glycemic Control (2015)  Target Ranges:  Prepandial:   less than 140 mg/dL      Peak postprandial:   less than 180 mg/dL (1-2 hours)      Critically ill patients:  140 - 180 mg/dL   Lab Results  Component Value Date   GLUCAP 80 12/27/2018   HGBA1C 7.2 (H) 12/25/2018    Current DM meds: Novolog (0-9 units) tid meals and (0-5 unit) hs                                Lantus 10 units daily   Clarification -   Per med rec stated home dose was [place] 100 units as directed. In looking back in his chart a hospital d/c summary from 01/2018 had Tresiba 5 units QHS and a 12/19 office visit also stated Tresiba 5 units QHS. As this was a large difference I asked patient and he told me to check with his wife as he didn't know the dosages.   Called and spoke to Belle in the main pharmacy for a med tech to please update in am with correct dose.  Spoke to patient's wife Tamitha who was very knowledgeable about her husband's diabetes medications. She states he takes Novolog tid with meals usually between 1-10 units (but never more than 12 units). He uses a ACCU-CHEK [employer] Plus meter which after he checks his blood it tells her how much insulin to give (and takes into account carbs entered). I clarified it is NOT a sensor that stays on but he still has to prick his finger and she said that was correct. He had at one point used a Dexcom G6 CGM but he is no longer doing that.   For basal insulin his wife stated he takes [place] 5 units QHS.     Jonna Clark RN, MSN Diabetes Coordinator Inpatient Glycemic Control Team Team Pager: [phone] (8am-5pm)

## 2018-12-28 ENCOUNTER — Encounter (HOSPITAL_COMMUNITY): Payer: Self-pay | Admitting: Interventional Radiology

## 2018-12-28 ENCOUNTER — Inpatient Hospital Stay (HOSPITAL_COMMUNITY): Payer: Medicare Other

## 2018-12-28 HISTORY — PX: IR US GUIDE VASC ACCESS LEFT: IMG2389

## 2018-12-28 HISTORY — PX: IR THROMBECTOMY AV FISTULA W/THROMBOLYSIS/PTA INC/SHUNT/IMG LEFT: IMG6106

## 2018-12-28 LAB — RENAL FUNCTION PANEL
Albumin: 2.6 g/dL — ABNORMAL LOW (ref 3.5–5.0)
Anion gap: 12 (ref 5–15)
BUN: 56 mg/dL — ABNORMAL HIGH (ref 6–20)
CO2: 25 mmol/L (ref 22–32)
Calcium: 6.3 mg/dL — CL (ref 8.9–10.3)
Chloride: 100 mmol/L (ref 98–111)
Creatinine, Ser: 6.63 mg/dL — ABNORMAL HIGH (ref 0.61–1.24)
GFR calc Af Amer: 10 mL/min — ABNORMAL LOW (ref >60)
GFR calc non Af Amer: 9 mL/min — ABNORMAL LOW (ref >60)
Glucose, Bld: 92 mg/dL (ref 70–99)
Phosphorus: 2.1 mg/dL — ABNORMAL LOW (ref 2.5–4.6)
Potassium: 2.3 mmol/L — CL (ref 3.5–5.1)
Sodium: 137 mmol/L (ref 135–145)

## 2018-12-28 LAB — CBC
HCT: 22.7 % — ABNORMAL LOW (ref 39.0–52.0)
Hemoglobin: 7.5 g/dL — ABNORMAL LOW (ref 13.0–17.0)
MCH: 25.8 pg — ABNORMAL LOW (ref 26.0–34.0)
MCHC: 33 g/dL (ref 30.0–36.0)
MCV: 78 fL — ABNORMAL LOW (ref 80.0–100.0)
Platelets: 63 10*3/uL — ABNORMAL LOW (ref 150–400)
RBC: 2.91 MIL/uL — ABNORMAL LOW (ref 4.22–5.81)
RDW: 15.7 % — ABNORMAL HIGH (ref 11.5–15.5)
WBC: 9.4 10*3/uL (ref 4.0–10.5)
nRBC: 0 % (ref 0.0–0.2)

## 2018-12-28 LAB — BASIC METABOLIC PANEL
Anion gap: 12 (ref 5–15)
BUN: 50 mg/dL — ABNORMAL HIGH (ref 6–20)
CO2: 22 mmol/L (ref 22–32)
Calcium: 6.9 mg/dL — ABNORMAL LOW (ref 8.9–10.3)
Chloride: 104 mmol/L (ref 98–111)
Creatinine, Ser: 6.53 mg/dL — ABNORMAL HIGH (ref 0.61–1.24)
GFR calc Af Amer: 10 mL/min — ABNORMAL LOW (ref >60)
GFR calc non Af Amer: 9 mL/min — ABNORMAL LOW (ref >60)
Glucose, Bld: 84 mg/dL (ref 70–99)
Potassium: 3.4 mmol/L — ABNORMAL LOW (ref 3.5–5.1)
Sodium: 138 mmol/L (ref 135–145)

## 2018-12-28 LAB — GLUCOSE, CAPILLARY
Glucose-Capillary: 76 mg/dL (ref 70–99)
Glucose-Capillary: 73 mg/dL (ref 70–99)
Glucose-Capillary: 73 mg/dL (ref 70–99)
Glucose-Capillary: 99 mg/dL (ref 70–99)

## 2018-12-28 MED ORDER — FENTANYL CITRATE (PF) 100 MCG/2ML IJ SOLN
INTRAMUSCULAR | Status: AC | PRN
  Administered 2018-12-28: 25 ug via INTRAVENOUS
  Administered 2018-12-28: 50 ug via INTRAVENOUS

## 2018-12-28 MED ORDER — LIDOCAINE HCL 1 % IJ SOLN
INTRAMUSCULAR | Status: AC
  Filled 2018-12-28: qty 20

## 2018-12-28 MED ORDER — IOHEXOL 300 MG/ML  SOLN: 50.0000 mL | Freq: Once | INTRAMUSCULAR | Status: DC | PRN

## 2018-12-28 MED ORDER — FENTANYL CITRATE (PF) 100 MCG/2ML IJ SOLN
INTRAMUSCULAR | Status: AC
  Filled 2018-12-28: qty 2

## 2018-12-28 MED ORDER — POTASSIUM CHLORIDE 10 MEQ/100ML IV SOLN
10.0000 meq | INTRAVENOUS | Status: AC
  Administered 2018-12-28 (×2): 10 meq via INTRAVENOUS
  Filled 2018-12-28 (×2): qty 100

## 2018-12-28 MED ORDER — CALCIUM GLUCONATE-NACL 2-0.675 GM/100ML-% IV SOLN
2.0000 g | Freq: Once | INTRAVENOUS | Status: AC
  Administered 2018-12-28: 2000 mg via INTRAVENOUS
  Filled 2018-12-28: qty 100

## 2018-12-28 MED ORDER — HEPARIN SODIUM (PORCINE) 1000 UNIT/ML IJ SOLN
INTRAMUSCULAR | Status: AC
  Filled 2018-12-28: qty 1

## 2018-12-28 MED ORDER — POTASSIUM CHLORIDE 10 MEQ/100ML IV SOLN
10.0000 meq | INTRAVENOUS | Status: AC
  Administered 2018-12-28 (×6): 10 meq via INTRAVENOUS
  Filled 2018-12-28 (×6): qty 100

## 2018-12-28 MED ORDER — ALTEPLASE 2 MG IJ SOLR
INTRAMUSCULAR | Status: AC | PRN
  Administered 2018-12-28: 4 mg

## 2018-12-28 MED ORDER — HEPARIN SODIUM (PORCINE) 1000 UNIT/ML IJ SOLN
INTRAMUSCULAR | Status: AC | PRN
  Administered 2018-12-28: 3000 [IU] via INTRAVENOUS

## 2018-12-28 MED ORDER — LIDOCAINE HCL (PF) 1 % IJ SOLN
INTRAMUSCULAR | Status: AC | PRN
  Administered 2018-12-28: 5 mL

## 2018-12-28 MED ORDER — MIDAZOLAM HCL 2 MG/2ML IJ SOLN
INTRAMUSCULAR | Status: AC
  Filled 2018-12-28: qty 2

## 2018-12-28 MED ORDER — POTASSIUM CHLORIDE CRYS ER 20 MEQ PO TBCR
40.0000 meq | EXTENDED_RELEASE_TABLET | Freq: Three times a day (TID) | ORAL | Status: AC
  Administered 2018-12-28: 40 meq via ORAL
  Filled 2018-12-28 (×3): qty 2

## 2018-12-28 MED ORDER — MIDAZOLAM HCL 2 MG/2ML IJ SOLN
INTRAMUSCULAR | Status: AC | PRN
  Administered 2018-12-28: 0.5 mg via INTRAVENOUS
  Administered 2018-12-28: 1 mg via INTRAVENOUS

## 2018-12-28 MED ORDER — ALTEPLASE 2 MG IJ SOLR
INTRAMUSCULAR | Status: AC
  Filled 2018-12-28: qty 4

## 2018-12-28 NOTE — Sedation Documentation (Signed)
Poor perfusion in patients distal extremities. Pulse oxygen sensor unable to accurately capture readings at times.

## 2018-12-28 NOTE — Progress Notes (Signed)
[facility] Progress Note   Subjective:   HD for a few min yesterday then access clotted.  Lost ~200-243mL blood in tubing.  On for IR declot today.  K still low.  UOP 426mL documented.  Cr 6.6 today. His nausea is much improved.  Still with 4 episodes of small volume diarrhea overnight but it's improving.   Objective Vitals:   12/27/18 1327 12/27/18 1330 12/27/18 2314 12/28/18 0610  BP: 126/70 126/70 111/76 109/75  Pulse: 80 80 88 88  Resp: 18 18 18 18   Temp:  98.4 F (36.9 C) 99.4 F (37.4 C) 99.6 F (37.6 C)  TempSrc:  Oral    SpO2:  98% 97% 100%  Weight:  58.5 kg    Height:       Physical Exam General: lying comfortably in bed Heart:  RRR, no rub Lungs: normal WOB, no rales Abdomen: soft, NABS, mild TTP Extremities: no edema Neuro: tremor of hands (he notes chronic), no asterixis Dialysis Access: L FA AVG no thrill  Additional Objective Labs: Basic Metabolic Panel: Recent Labs  Lab 12/26/18 0754 12/27/18 0252 12/28/18 0329  NA 142 141 137  K 2.1* 2.0* 2.3*  CL 113* 105 100  CO2 17* 24 25  GLUCOSE 99 131* 92  BUN 68* 67* 56*  CREATININE 7.19* 7.31* 6.63*  CALCIUM 7.2* 6.6* 6.3*  PHOS 2.7 2.9 2.1*   Liver Function Tests: Recent Labs  Lab 12/24/18 1804 12/26/18 0754 12/27/18 0252 12/28/18 0329  AST 9*  --   --   --   ALT 12  --   --   --   ALKPHOS 113  --   --   --   BILITOT 0.3  --   --   --   PROT 7.3  --   --   --   ALBUMIN 4.5 3.0* 2.9* 2.6*   Recent Labs  Lab 12/24/18 1804  LIPASE 768*   CBC: Recent Labs  Lab 12/24/18 1804 12/25/18 0110 12/26/18 0754 12/26/18 1704 12/27/18 0252 12/28/18 0329  WBC 15.6* 17.7* 6.8  --  8.9 9.4  HGB 10.9* 9.1* 6.5* 8.0* 8.2* 7.5*  HCT 34.4* 28.5* 18.7* 22.9* 23.6* 22.7*  MCV 84.9 83.3 77.6*  --  76.9* 78.0*  PLT 212 167 95*  --  78* 63*   Blood Culture    Component Value Date/Time   SDES BLOOD SITE NOT SPECIFIED 12/24/2018 1804   SPECREQUEST  12/24/2018 1804    BOTTLES DRAWN  AEROBIC AND ANAEROBIC Blood Culture adequate volume   CULT  12/24/2018 1804    NO GROWTH 3 DAYS Performed at [facility], [place] [address] [place], [place] [address]    REPTSTATUS PENDING 12/24/2018 1804    Cardiac Enzymes: Recent Labs  Lab 12/24/18 1804 12/25/18 0110 12/25/18 0455  CKTOTAL  --  58  --   TROPONINI <0.03 <0.03 <0.03   CBG: Recent Labs  Lab 12/27/18 0753 12/27/18 1145 12/27/18 1642 12/27/18 2318 12/28/18 0837  GLUCAP 99 138* 80 127* 76   Iron Studies:  Recent Labs    12/26/18 0046  IRON 79  TIBC 151*  FERRITIN 701*   @lablastinr3 @ Studies/Results: No results found. Medications: . potassium chloride 10 mEq (12/28/18 0856)   . chlorhexidine  15 mL Mouth Rinse BID  . Chlorhexidine Gluconate Cloth  6 each Topical Q0600  . Chlorhexidine Gluconate Cloth  6 each Topical Q0600  . insulin aspart  0-5 Units Subcutaneous QHS  . insulin aspart  0-9 Units Subcutaneous TID WC  . insulin glargine  10 Units Subcutaneous Daily  . mouth rinse  15 mL Mouth Rinse q12n4p  . mycophenolate  720 mg Oral BID  . potassium chloride  40 mEq Oral TID  . tacrolimus  6 mg Oral Daily   And  . tacrolimus  5 mg Oral QHS     Assessment/Plan: The patient is a 48 y.o. year old male patient with a past medical history significant for end-stage renal disease status post renal transplantation, type 2 diabetes, chronic anemia, hyperlipidemia, and hypertension who presented to the emergency department with increasing shortness of breath for approximately 3 weeks with associated nausea and vomiting.  1.  End-stage renal disease status post renal transplantation.  His most recent office visit to [facility] transplant was in 11-10-2017.  He received a deceased donor renal transplant on 12/26/2013.  100% PRA, KDPII greater than 85.  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  He had a work-up for HUS that was not thought to be  contributory.  His baseline creatinine as documented back in 11-10-17 was about 2-3.  He is on maintenance immunosuppression with tacrolimus 6 in the morning and 5 at night, and Myfortic 4 tabs twice daily.  He was previously on prednisone, but he reports that was discontinued.  Tac trough still pending, check again tomorrow.   2.  Acute kidney injury.  Unclear if this is acute GI issue leading to hypovolemia and AKI versus progression of underlying disease and uremia.  Cr 13 on admission, improved to 7.2 with 2 hrs of HD + fluids, stable at 7.3 yesterday.  6.6 this AM which I think may be start of recovery as he didn't receive enough HD yesterday to really make a difference.  Plan for declot today and will hold HD until tomorrow.   Considering utility of renal biopsy if lack of improvement.    3.  Diarrhea:  c diff negative.  Check CMV VL and GI viral pathogen panel.  If persists will need to involve GI for potential colonoscopy given immunosuppressed status.   4.  Diabetic ketoacidosis.  Resolved.  A1c is 7.2.   5.  Secondary hyperparathyroidism.  PTH is within goal for CKD stage.  Phosphorus is 5.4.  We will continue to monitor.  6.  Anemia of chronic kidney disease. Iron replete. Hb 12.2 on presentation, down into 6s.  Rec'd 1 u pRBC 5/12 with appropriate increase.  Hb 7.5 this AM which I expect is due to blood loss in tubing yesterday.   7.  Thrombocytopenia:  Chronic issue.  Plt have trended down during admission but in fact are now at his baseline.  I think a lot of the change this admission was hemoconcentration on presentation.    8.  Hypokalemia:  Aggressive repletion. Recheck tonight 6pm and replete overnight as we are having trouble catching up.   Jannifer Hick MD 12/28/2018, 8:59 AM  [facility] Pager: [phone]

## 2018-12-28 NOTE — Procedures (Signed)
Pre procedural Dx: ESRD Post procedural Dx: Same.   Technically successful declot of left forearm dialysis graft.   Access is ready for immediate use.  EBL: Trace  Complications: None immediate   Ronny Bacon, MD Pager #: [phone]

## 2018-12-28 NOTE — Progress Notes (Signed)
Inpatient Diabetes Program Recommendations  AACE/ADA: New Consensus Statement on Inpatient Glycemic Control (2015)  Target Ranges:  Prepandial:   less than 140 mg/dL      Peak postprandial:   less than 180 mg/dL (1-2 hours)      Critically ill patients:  140 - 180 mg/dL   Lab Results  Component Value Date   GLUCAP 73 12/28/2018   HGBA1C 7.2 (H) 12/25/2018    Review of Glycemic Control Results for PAVLE, WILER (MRN [id number]) as of 12/28/2018 15:28  Ref. Range 12/27/2018 07:53 12/27/2018 11:45 12/27/2018 16:42 12/27/2018 23:18 12/28/2018 08:37 12/28/2018 11:42  Glucose-Capillary Latest Ref Range: 70 - 99 mg/dL 99 138 (H) 80 127 (H) 76 73   Diabetes history: DM 2 Outpatient Diabetes medications:  Tresiba 5 units q HS (verified by diabetes coordinator yesterday), Novolog 1-10 units tid with meals  Current orders for Inpatient glycemic control:  Lantus 10 units q HS Novolog sensitive tid with meals and HS  Inpatient Diabetes Program Recommendations:    Please reduce Lantus to 5 units q HS.   Thanks,  Adah Perl, RN, BC-ADM Inpatient Diabetes Coordinator Pager [phone] (8a-5p)

## 2018-12-28 NOTE — Consult Note (Signed)
Chief Complaint: Patient was seen in consultation today for left arm dialysis graft declot with possible angioplasty/stent-- possible tunneled dialysis catheter placement at the request of Dr Leanora Cover   Supervising Physician: Sandi Mariscal  Patient Status: [facility]  History of Present Illness: [name] is a 48 y.o. 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 Secondary Hyperparathyroidism- PTH 188, P 5.4, Ca 9.2 12/2008   Hemodialysis-associated hypotension    coreg d/c'd   Herniated lumbar disc without myelopathy    History of blood transfusion    Hyperparathyroidism due to renal insufficiency (HCC)    Hypertension    resloved after initating HD, now with post HD hypotension   Hypocalcemia    Hypomagnesemia    Kidney transplant as cause of abnormal reaction or later complication 2162   [facility]   Non-ischemic cardiomyopathy ([place])    EF 20-25% 12/23/2008>>EF 40-45% 03/2009>>EF ??? 7/?/2011   Occult blood positive stool    Psoriasis    scalp   Thrombocytopenia (HCC)    Vitamin B 12 deficiency      Past Surgical History:  Procedure Laterality Date   AV FISTULA PLACEMENT     left   AV FISTULA PLACEMENT  09/12/2012   Procedure: INSERTION OF ARTERIOVENOUS (AV) GORE-TEX GRAFT ARM;  Surgeon: Elam Dutch, MD;  Location: [place];  Service: Vascular;  Laterality: Left;  KIDNEY TRANSPLANT FAILED   CATARACT EXTRACTION, BILATERAL     EYE SURGERY     INSERTION OF DIALYSIS CATHETER  07/14/2012   Procedure: INSERTION OF DIALYSIS CATHETER;  Surgeon: Mal Misty, MD;  Location: [facility] OR;  Service: Vascular;  Laterality: Right;  Ultrasound Guided Insertion of Internal Jugular Dialysis Catheter   KIDNEY TRANSPLANT  2013   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   KIDNEY TRANSPLANT  12/2013   second kidney transplant   lumbar disc sugery   10/17/2017   LUMBAR [place] TRANSPLANTED ORGAN  08/11/12   PHOTOCOAGULATION     for diabetic retinopathy 08/1999   SHUNTOGRAM N/A 07/11/2012   Procedure: Earney Mallet;  Surgeon: Serafina Mitchell, MD;  Location: [facility] CATH LAB;  Service: Cardiovascular;  Laterality: N/A;    Allergies: Lactose intolerance (gi); Other; Pollen extract; and Tape  Medications: Prior to Admission medications   Medication Sig Start Date End Date Taking? Authorizing Provider  aspirin EC 81 MG tablet Take 81 mg by mouth daily.   Yes [provider]  carvedilol (COREG) 6.25 MG tablet Take 1 tablet (6.25 mg total) by mouth 2 (two) times daily  with a meal. 12/20/17  Yes [name], NP  dapsone 25 MG tablet Take 50 mg by mouth daily.   Yes [provider]  folic acid (FOLVITE) 1 MG tablet Take 1 mg by mouth daily.  06/24/15  Yes [provider]  mycophenolate (MYFORTIC) 180 MG EC tablet Take 720 mg by mouth 2 (two) times daily.    Yes [provider]  NOVOLOG FLEXPEN 100 UNIT/ML FlexPen INJECT 12 UNITS INTO THE SKIN 3 TIMES DAILY BEFORE MEALS. Patient taking differently: Inject 1-10 Units into the skin 3 (three) times  daily. Per sliding scale. 10/31/18  Yes [name], NP  omeprazole (PRILOSEC) 20 MG capsule Take 1 capsule (20 mg total) by mouth daily. 12/20/17  Yes [name], NP  tacrolimus (PROGRAF) 1 MG capsule Take 5-6 mg by mouth See admin instructions. 6 mg in the morning and 5 mg in the evening - 1000, 2200   Yes [provider]  TRESIBA FLEXTOUCH 100 UNIT/ML SOPN FlexTouch Pen Inject 5 Units into the skin at bedtime.    Yes [provider]  atorvastatin (LIPITOR) 20 MG tablet TAKE 1 TABLET BY MOUTH AT BEDTIME FOR CHOLESTEROL Patient not taking: Reported on 10/09/2018 08/02/18   [name], NP  Continuous Blood Gluc Receiver ([place] KIT) [place] 1 each by Does not apply route 3 (three) times daily. Use to test blood sugar three times daily. Dx: E11.9 08/03/18   [name], NP  Continuous Blood Gluc Sensor (DEXCOM G6 SENSOR) MISC 1 each by Does not apply route 3 (three) times daily. Use to test blood sugar three times daily. Dx: E11.9 08/03/18   [name], NP  Continuous Blood Gluc Transmit (DEXCOM G6 TRANSMITTER) MISC 1 each by Does not apply route 3 (three) times daily. Use to test blood sugar three times daily. Dx: E11.9 08/03/18   [name], NP  Insulin Pen Needle 31G X 5 MM MISC 5 Units/oz/day by Does not apply route at bedtime. Use 4 times daily to inject insulin to skin 12/07/17   Georgette Shell, MD  ketoconazole (NIZORAL) 2 % shampoo Apply topically 2 (two) times a week. Wash hair and beard.  Leave shampoo on skin for 5 mins before washing off. Patient not taking: Reported on 10/09/2018 04/24/14   Kelby Aline, MD  Magnesium 400 MG CAPS Take 1,600 mg by mouth 2 (two) times daily.     [provider]  sodium bicarbonate 650 MG tablet Take 1,300 mg by mouth 3 (three) times daily.  06/24/15   [provider]     Family History  Problem Relation Age of Onset   Diabetes Father    Kidney disease Father     Other Father        amputation   Hypertension Mother    Dementia Mother    Diabetes Sister    Hypertension Sister    Diabetes Sister    Kidney disease Sister    Diabetes Sister    Hypertension Brother    Diabetes Brother    CAD Neg Hx    Sudden Cardiac Death Neg Hx    Congestive Heart Failure Neg Hx    Colon cancer Neg Hx    Rectal cancer Neg Hx    Esophageal cancer Neg Hx    Liver cancer Neg Hx     Social History   Socioeconomic History   Marital status: Married    Spouse name: Not on  file   Number of children: 2   Years of education: Not on file   Highest education level: Not on file  Occupational History   Occupation: disabled  Social Designer, fashion/clothing strain: Somewhat hard   Food insecurity:    Worry: Never true    Inability: Never true   Transportation needs:    Medical: No    Non-medical: No  Tobacco Use   Smoking status: Never Smoker   Smokeless tobacco: Never Used  Substance and Sexual Activity   Alcohol use: No    Alcohol/week: 0.0 standard drinks   Drug use: No   Sexual activity: Not on file  Lifestyle   Physical activity:    Days per week: 4 days    Minutes per session: 30 min   Stress: Not at all  Relationships   Social connections:    Talks on phone: More than three times a week    Gets together: More than three times a week    Attends religious service: More than 4 times per year    Active member of club or organization: No    Attends meetings of clubs or organizations: Never    Relationship status: Married  Other Topics Concern   Not on file  Social History Narrative   Social History      Diet?       Do you drink/eat things with caffeine? yes      Marital status?            married                        What year were you married?       Do you live in a house, apartment, assisted living, condo, trailer, etc.? apartment      Is it one or more stories? one      How many persons live in  your home? 2      Do you have any pets in your home? (please list) no      Highest level of education completed?       Current or past profession: bus owner      Do you exercise?            yes                          Type & how often? Walking daily      Advanced Directives      Do you have a living will? no      Do you have a DNR form?            no                      If not, do you want to discuss one?      Do you have signed POA/HPOA for forms? no      Functional Status      Do you have difficulty bathing or dressing yourself? yes      Do you have difficulty preparing food or eating? no      Do you have difficulty managing your medications? yes      Do you have difficulty managing your finances? no      Do you have difficulty affording your medications? yes    Review of Systems: A 12 point ROS discussed and pertinent positives are indicated  in the HPI above.  All other systems are negative.  Review of Systems  Constitutional: Positive for activity change and fatigue. Negative for fever.  Respiratory: Positive for shortness of breath. Negative for cough.   Gastrointestinal: Negative for abdominal pain.  Neurological: Positive for weakness.  Psychiatric/Behavioral: Negative for behavioral problems and confusion.    Vital Signs: BP 109/75    Pulse 88    Temp 99.6 F (37.6 C)    Resp 18    Ht '5\' 5"'$  (1.651 m)    Wt 128 lb 15.5 oz (58.5 kg)    SpO2 100%    BMI 21.46 kg/m   Physical Exam Vitals signs reviewed.  Cardiovascular:     Rate and Rhythm: Normal rate and regular rhythm.     Heart sounds: Normal heart sounds.  Pulmonary:     Breath sounds: Normal breath sounds.  Abdominal:     General: Bowel sounds are normal.  Musculoskeletal: Normal range of motion.     Comments: L forearm graft-- no thrill; no pulse  Skin:    General: Skin is warm and dry.  Neurological:     General: No focal deficit present.     Mental Status: He is alert and oriented to person,  place, and time.  Psychiatric:        Mood and Affect: Mood normal.        Behavior: Behavior normal.        Thought Content: Thought content normal.        Judgment: Judgment normal.     Imaging: Dg Chest Port 1 View  Result Date: 12/24/2018 CLINICAL DATA:  Tachypnea EXAM: PORTABLE CHEST 1 VIEW COMPARISON:  01/18/2018 FINDINGS: The heart size and mediastinal contours are within normal limits. Both lungs are clear. The visualized skeletal structures are unremarkable. IMPRESSION: No acute abnormality of the lungs in AP portable projection. Electronically Signed   By: Eddie Candle M.D.   On: 12/24/2018 18:41   Ct Renal Stone Study  Result Date: 12/25/2018 CLINICAL DATA:  Flank pain, history of renal transplant EXAM: CT ABDOMEN AND PELVIS WITHOUT CONTRAST TECHNIQUE: Multidetector CT imaging of the abdomen and pelvis was performed following the standard protocol without IV contrast. COMPARISON:  None. FINDINGS: Examination of the abdomen pelvis is generally limited by motion artifact throughout. Lower chest: No acute abnormality. Hepatobiliary: No focal liver abnormality is seen. The gallbladder is mildly distended and may contain dependent sludge; evaluation is particularly limited by breath motion artifact through this level. Pancreas: Unremarkable. No pancreatic ductal dilatation or surrounding inflammatory changes. Spleen: Normal in size without focal abnormality. Adrenals/Urinary Tract: Adrenal glands are unremarkable. 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 No hydronephrosis or ureteral calculus. 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 Bladder is unremarkable. Stomach/Bowel: Stomach is within normal limits. Appendix appears normal. The colon is fluid-filled to the rectum. Vascular/Lymphatic: No significant vascular findings are present. No  enlarged abdominal or pelvic lymph nodes. Reproductive: No mass or other abnormality. Other: No abdominal wall hernia or abnormality. No abdominopelvic ascites. Musculoskeletal: No acute or significant osseous findings. IMPRESSION: 1. Examination of the abdomen and pelvis is generally limited by motion artifact throughout. Within this limitation, no definite non-contrast CT findings to explain lower abdominal pain. 2. The gallbladder is mildly distended and may contain dependent sludge; evaluation is particularly limited by breath motion artifact through this level. 3. Atrophic native kidneys are present  bilaterally with small bilateral nonobstructive calculi and/or medullary calcifications. No hydronephrosis or ureteral calculus. 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 4. The colon is fluid-filled to the rectum, in keeping with diarrheal illness. Electronically Signed   By: Eddie Candle M.D.   On: 12/25/2018 08:43    Labs:  CBC: Recent Labs    12/25/18 0110 12/26/18 0754 12/26/18 1704 12/27/18 0252 12/28/18 0329  WBC 17.7* 6.8  --  8.9 9.4  HGB 9.1* 6.5* 8.0* 8.2* 7.5*  HCT 28.5* 18.7* 22.9* 23.6* 22.7*  PLT 167 95*  --  78* 63*    COAGS: Recent Labs    12/26/18 0754  INR 1.2  APTT 25    BMP: Recent Labs    12/25/18 2106 12/26/18 0754 12/27/18 0252 12/28/18 0329  NA 140 142 141 137  K 3.3* 2.1* 2.0* 2.3*  CL 120* 113* 105 100  CO2 <7* 17* 24 25  GLUCOSE 192* 99 131* 92  BUN 112* 68* 67* 56*  CALCIUM 7.2* 7.2* 6.6* 6.3*  CREATININE 10.30* 7.19* 7.31* 6.63*  GFRNONAA 5* 8* 8* 9*  GFRAA 6* 9* 9* 10*    LIVER FUNCTION TESTS: Recent Labs    01/18/18 1134 02/06/18 1434 08/01/18 1620 12/24/18 1804 12/26/18 0754 12/27/18 0252 12/28/18 0329  BILITOT 0.4 0.4 0.5 0.3  --   --   --   AST 11* 8* 10 9*  --   --   --   ALT 7* 4* 4* 12  --   --   --   ALKPHOS 54  --   --  113  --   --   --   PROT  6.7 6.6 6.4 7.3  --   --   --   ALBUMIN 4.4  --   --  4.5 3.0* 2.9* 2.6*    TUMOR MARKERS: No results for input(s): AFPTM, CEA, CA199, CHROMGRNA in the last 8760 hours.  Assessment and Plan:  ESRD Post 2015 renal transplant First use L arm graft just [facility] 12/25/18 Clotted yesterday Now for attempt at thrombolysis/angioplasty/stent Possible tunneled dialysis catheter placement Risks and benefits discussed with the patient including, but not limited to bleeding, infection, vascular injury, pulmonary embolism, need for tunneled HD catheter placement or even death.  All of the patient's questions were answered, patient is agreeable to proceed. Consent signed and in chart.   Thank you for this interesting consult.  I greatly enjoyed meeting Pranish Akhavan and look forward to participating in their care.  A copy of this report was sent to the requesting provider on this date.  Electronically Signed: Lavonia Drafts, PA-C 12/28/2018, 2:00 PM   I spent a total of 40 Minutes    in face to face in clinical consultation, greater than 50% of which was counseling/coordinating care for left arm dialysis graft declot

## 2018-12-28 NOTE — Progress Notes (Signed)
Call received from dialysis to get report of patient so he can come to dialysis.  Report was given to nurse.  When patient was informed about the dialysis, patient refused. Patient verbalized he will not do dialysis tonight but in the morning. Patient was educated about the dialysis.

## 2018-12-28 NOTE — Plan of Care (Signed)
  Problem: Elimination: Goal: Will not experience complications related to bowel motility Outcome: Not Progressing  Continues to have diarrhea and lose stools. I&O monitored and recorded. Peri care provided  Problem: Skin Integrity: Goal: Risk for impaired skin integrity will decrease 12/28/2018 1843 by Tenna Delaine, RN Outcome: Not Progressing  MASD slightly worse this shift. Peri care provided. Barrier cream applied.

## 2018-12-28 NOTE — Progress Notes (Signed)
PROGRESS NOTE        PATIENT DETAILS Name: [name] Age: 48 y.o. Sex: male Date of Birth: 09-28-1970 Admit Date: 12/24/2018 Admitting Physician Rise Patience, MD [id number], [name], NP  Brief Narrative: Patient is a 48 y.o. male with history of renal transplant, DM-2, HTN, dyslipidemia-who presented with DKA, AKI on CKD stage III.  See below for further details.  Subjective: No chest pain, SOB, Nausea, vomiting.  Has had worsening diarrhea since yesterday  Assessment/Plan: DKA: Resolved-on admission required insulin infusion-has been transitioned to subcutaneous insulin.  Severe metabolic acidosis: Felt to be a combination of DKA, AKI.  Improved after resolution of DKA and HD.  AKI on CKD stage IV: Has history of renal transplantation-AKI thought to be progression of underlying CKD, nephrology following-not sure if patient is now at ESRD and is dialysis dependent.  Nephrology following and directing care-await further input from nephrology.   Diarrhea: Worsening diarrhea since yesterday-immunocompromised-nephrology has ordered stool studies-we will follow  History of renal transplantation: Mains on maintenance immunosuppression with tacrolimus and Myfortic.    Hypokalemia: Continue to replete and recheck.  Thrombocytopenia: Appears to be a chronic issue-has had thrombocytopenia going back to 2019.  No evidence of bleeding-follow for now.   Anemia: Likely secondary to chronic kidney disease-no evidence of blood loss-follow CBC closely.    Insulin-dependent DM-2: CBGs stable, continue Lantus 10 units and SSI.  May need to decrease Lantus if CBGs drop any further-but currently n.p.o. for IR procedure later today.    Prolonged QTc interval: Resolved-probably secondary to hypokalemia  DVT Prophylaxis: SCDs-stop heparin given worsening thrombocytopenia  Code Status: Full code  Family Communication: None at bedside  Disposition  Plan: Remain inpatient  Antimicrobial agents: Anti-infectives (From admission, onward)   None      Procedures: None  CONSULTS:  nephrology  Time spent: 25 minutes-Greater than 50% of this time was spent in counseling, explanation of diagnosis, planning of further management, and coordination of care.  MEDICATIONS: Scheduled Meds:  chlorhexidine  15 mL Mouth Rinse BID   Chlorhexidine Gluconate Cloth  6 each Topical Q0600   Chlorhexidine Gluconate Cloth  6 each Topical Q0600   insulin aspart  0-5 Units Subcutaneous QHS   insulin aspart  0-9 Units Subcutaneous TID WC   insulin glargine  10 Units Subcutaneous Daily   mouth rinse  15 mL Mouth Rinse q12n4p   mycophenolate  720 mg Oral BID   potassium chloride  40 mEq Oral TID   tacrolimus  6 mg Oral Daily   And   tacrolimus  5 mg Oral QHS   Continuous Infusions:  potassium chloride 10 mEq (12/28/18 1232)   PRN Meds:.loperamide, phenol, promethazine   PHYSICAL EXAM: Vital signs: Vitals:   12/27/18 1327 12/27/18 1330 12/27/18 2314 12/28/18 0610  BP: 126/70 126/70 111/76 109/75  Pulse: 80 80 88 88  Resp: 18 18 18 18   Temp:  98.4 F (36.9 C) 99.4 F (37.4 C) 99.6 F (37.6 C)  TempSrc:  Oral    SpO2:  98% 97% 100%  Weight:  58.5 kg    Height:       Filed Weights   12/26/18 0500 12/27/18 0553 12/27/18 1330  Weight: 54.9 kg 57.2 kg 58.5 kg   Body mass index is 21.46 kg/m.   General appearance:Awake, alert, not in any distress.  Eyes:no scleral  icterus. HEENT: Atraumatic and Normocephalic Neck: supple, no JVD. Resp:Good air entry bilaterally,no rales or rhonchi CVS: S1 S2 regular GI: Bowel sounds present, Non tender and not distended with no gaurding, rigidity or rebound. Extremities: B/L Lower Ext shows no edema, both legs are warm to touch Neurology:  Non focal Musculoskeletal:No digital cyanosis Skin:No Rash, warm and dry Wounds:N/A  I have personally reviewed following labs and imaging  studies  LABORATORY DATA: CBC: Recent Labs  Lab 12/24/18 1804 12/25/18 0110 12/26/18 0754 12/26/18 1704 12/27/18 0252 12/28/18 0329  WBC 15.6* 17.7* 6.8  --  8.9 9.4  HGB 10.9* 9.1* 6.5* 8.0* 8.2* 7.5*  HCT 34.4* 28.5* 18.7* 22.9* 23.6* 22.7*  MCV 84.9 83.3 77.6*  --  76.9* 78.0*  PLT 212 167 95*  --  78* 63*    Basic Metabolic Panel: Recent Labs  Lab 12/25/18 0110  12/25/18 1340 12/25/18 2106 12/26/18 0754 12/27/18 0252 12/27/18 0732 12/28/18 0329  NA 137   < > 143 140 142 141  --  137  K 4.1   < > 4.5 3.3* 2.1* 2.0*  --  2.3*  CL 117*   < > 127* 120* 113* 105  --  100  CO2 <7*   < > <7* <7* 17* 24  --  25  GLUCOSE 244*   < > 189* 192* 99 131*  --  92  BUN 122*   < > 120* 112* 68* 67*  --  56*  CREATININE 11.90*   < > 10.75* 10.30* 7.19* 7.31*  --  6.63*  CALCIUM 7.9*   < > 7.3* 7.2* 7.2* 6.6*  --  6.3*  MG 1.8  --  1.5*  --   --   --  1.5*  --   PHOS  --   --   --   --  2.7 2.9  --  2.1*   < > = values in this interval not displayed.    GFR: Estimated Creatinine Clearance: 11 mL/min (A) (by C-G formula based on SCr of 6.63 mg/dL (H)).  Liver Function Tests: Recent Labs  Lab 12/24/18 1804 12/26/18 0754 12/27/18 0252 12/28/18 0329  AST 9*  --   --   --   ALT 12  --   --   --   ALKPHOS 113  --   --   --   BILITOT 0.3  --   --   --   PROT 7.3  --   --   --   ALBUMIN 4.5 3.0* 2.9* 2.6*   Recent Labs  Lab 12/24/18 1804  LIPASE 768*   No results for input(s): AMMONIA in the last 168 hours.  Coagulation Profile: Recent Labs  Lab 12/26/18 0754  INR 1.2    Cardiac Enzymes: Recent Labs  Lab 12/24/18 1804 12/25/18 0110 12/25/18 0455  CKTOTAL  --  58  --   TROPONINI <0.03 <0.03 <0.03    BNP (last 3 results) No results for input(s): PROBNP in the last 8760 hours.  HbA1C: No results for input(s): HGBA1C in the last 72 hours.  CBG: Recent Labs  Lab 12/27/18 1145 12/27/18 1642 12/27/18 2318 12/28/18 0837 12/28/18 1142  GLUCAP 138* 80  127* 76 73    Lipid Profile: No results for input(s): CHOL, HDL, LDLCALC, TRIG, CHOLHDL, LDLDIRECT in the last 72 hours.  Thyroid Function Tests: No results for input(s): TSH, T4TOTAL, FREET4, T3FREE, THYROIDAB in the last 72 hours.  Anemia Panel: Recent Labs    12/25/18  1904 12/26/18 0046  VITAMINB12 197  --   FOLATE 22.7  --   FERRITIN 663* 701*  TIBC 150* 151*  IRON 83 79  RETICCTPCT 1.4  --     Urine analysis:    Component Value Date/Time   COLORURINE YELLOW 12/25/2018 0200   APPEARANCEUR CLOUDY (A) 12/25/2018 0200   LABSPEC 1.012 12/25/2018 0200   PHURINE 5.0 12/25/2018 0200   GLUCOSEU NEGATIVE 12/25/2018 0200   HGBUR MODERATE (A) 12/25/2018 0200   BILIRUBINUR NEGATIVE 12/25/2018 0200   KETONESUR NEGATIVE 12/25/2018 0200   PROTEINUR 30 (A) 12/25/2018 0200   UROBILINOGEN 0.2 12/23/2008 1845   NITRITE NEGATIVE 12/25/2018 0200   LEUKOCYTESUR NEGATIVE 12/25/2018 0200    Sepsis Labs: Lactic Acid, Venous    Component Value Date/Time   LATICACIDVEN 2.2 ([place]) 12/24/2018 1804    MICROBIOLOGY: Recent Results (from the past 240 hour(s))  Culture, blood (single)     Status: None (Preliminary result)   Collection Time: 12/24/18  6:04 PM  Result Value Ref Range Status   Specimen Description BLOOD SITE NOT SPECIFIED  Final   Special Requests   Final    BOTTLES DRAWN AEROBIC AND ANAEROBIC Blood Culture adequate volume   Culture   Final    NO GROWTH 4 DAYS Performed at [facility], [place] [address], [place], [place] [address]    Report Status PENDING  Incomplete  SARS Coronavirus 2 (CEPHEID - Performed in [place] hospital lab), Hosp Order     Status: None   Collection Time: 12/24/18  6:19 PM  Result Value Ref Range Status   SARS Coronavirus 2 NEGATIVE NEGATIVE Final    Comment: (NOTE) If result is NEGATIVE SARS-CoV-2 target nucleic acids are NOT DETECTED. The SARS-CoV-2 RNA is generally detectable in upper and lower  respiratory specimens during the  acute phase of infection. The lowest  concentration of SARS-CoV-2 viral copies this assay can detect is 250  copies / mL. A negative result does not preclude SARS-CoV-2 infection  and should not be used as the sole basis for treatment or other  patient management decisions.  A negative result may occur with  improper specimen collection / handling, submission of specimen other  than nasopharyngeal swab, presence of viral mutation(s) within the  areas targeted by this assay, and inadequate number of viral copies  (<250 copies / mL). A negative result must be combined with clinical  observations, patient history, and epidemiological information. If result is POSITIVE SARS-CoV-2 target nucleic acids are DETECTED. The SARS-CoV-2 RNA is generally detectable in upper and lower  respiratory specimens dur ing the acute phase of infection.  Positive  results are indicative of active infection with SARS-CoV-2.  Clinical  correlation with patient history and other diagnostic information is  necessary to determine patient infection status.  Positive results do  not rule out bacterial infection or co-infection with other viruses. If result is PRESUMPTIVE POSTIVE SARS-CoV-2 nucleic acids MAY BE PRESENT.   A presumptive positive result was obtained on the submitted specimen  and confirmed on repeat testing.  While 2019 novel coronavirus  (SARS-CoV-2) nucleic acids may be present in the submitted sample  additional confirmatory testing may be necessary for epidemiological  and / or clinical management purposes  to differentiate between  SARS-CoV-2 and other Sarbecovirus currently known to infect humans.  If clinically indicated additional testing with an alternate test  methodology [phone]) is advised. The SARS-CoV-2 RNA is generally  detectable in upper and lower respiratory sp ecimens  during the acute  phase of infection. The expected result is Negative. Fact Sheet for Patients:   StrictlyIdeas.no Fact Sheet for Healthcare Providers: BankingDealers.co.za This test is not yet approved or cleared by the [place] FDA and has been authorized for detection and/or diagnosis of SARS-CoV-2 by FDA under an Emergency Use Authorization (EUA).  This EUA will remain in effect (meaning this test can be used) for the duration of the COVID-19 declaration under Section 564(b)(1) of the Act, 21 U.S.C. section 360bbb-3(b)(1), unless the authorization is terminated or revoked sooner. Performed at [facility], [place] [address], [place], [place] [address]   Stool culture (children & immunocomp patients)     Status: None (Preliminary result)   Collection Time: 12/25/18  7:20 PM  Result Value Ref Range Status   Salmonella/Shigella Screen PENDING  Incomplete   Campylobacter Culture PENDING  Incomplete   E coli, Shiga toxin Assay Negative Negative Final    Comment: (NOTE) Performed At: [facility] [place], [place] [id number] Rush Farmer MD [id number]   C difficile quick scan w PCR reflex     Status: None   Collection Time: 12/25/18  7:20 PM  Result Value Ref Range Status   C Diff antigen NEGATIVE NEGATIVE Final   C Diff toxin NEGATIVE NEGATIVE Final   C Diff interpretation No C. difficile detected.  Final    Comment: Performed at [facility], [place] [address] [place],  [address]    RADIOLOGY STUDIES/RESULTS: Dg Chest Port 1 View  Result Date: 12/24/2018 CLINICAL DATA:  Tachypnea EXAM: PORTABLE CHEST 1 VIEW COMPARISON:  01/18/2018 FINDINGS: The heart size and mediastinal contours are within normal limits. Both lungs are clear. The visualized skeletal structures are unremarkable. IMPRESSION: No acute abnormality of the lungs in AP portable projection. Electronically Signed   By: Eddie Candle M.D.   On: 12/24/2018 18:41   Ct Renal Stone Study  Result Date: 12/25/2018 CLINICAL DATA:   Flank pain, history of renal transplant EXAM: CT ABDOMEN AND PELVIS WITHOUT CONTRAST TECHNIQUE: Multidetector CT imaging of the abdomen and pelvis was performed following the standard protocol without IV contrast. COMPARISON:  None. FINDINGS: Examination of the abdomen pelvis is generally limited by motion artifact throughout. Lower chest: No acute abnormality. Hepatobiliary: No focal liver abnormality is seen. The gallbladder is mildly distended and may contain dependent sludge; evaluation is particularly limited by breath motion artifact through this level. Pancreas: Unremarkable. No pancreatic ductal dilatation or surrounding inflammatory changes. Spleen: Normal in size without focal abnormality. Adrenals/Urinary Tract: Adrenal glands are unremarkable. 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 No hydronephrosis or ureteral calculus. 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 Bladder is unremarkable. Stomach/Bowel: Stomach is within normal limits. Appendix appears normal. The colon is fluid-filled to the rectum. Vascular/Lymphatic: No significant vascular findings are present. No enlarged abdominal or pelvic lymph nodes. Reproductive: No mass or other abnormality. Other: No abdominal wall hernia or abnormality. No abdominopelvic ascites. Musculoskeletal: No acute or significant osseous findings. IMPRESSION: 1. Examination of the abdomen and pelvis is generally limited by motion artifact throughout. Within this limitation, no definite non-contrast CT findings to explain lower abdominal pain. 2. The gallbladder is mildly distended and may contain dependent sludge; evaluation is particularly limited by breath motion artifact through this level. 3. Atrophic native kidneys are present bilaterally with small bilateral nonobstructive calculi and/or medullary  calcifications.  No hydronephrosis or ureteral calculus. 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 4. The colon is fluid-filled to the rectum, in keeping with diarrheal illness. Electronically Signed   By: Eddie Candle M.D.   On: 12/25/2018 08:43     LOS: 4 days   Oren Binet, MD  [facility]  If 7PM-7AM, please contact night-coverage  Please page via [email or web address]  Go to [email or web address] and use [facility]'s universal password to access. If you do not have the password, please contact the hospital operator.  Locate the [facility] provider you are looking for under [facility] and page to a number that you can be directly reached. If you still have difficulty reaching the provider, please page the [facility] (Director on Call) for the Hospitalists listed on amion for assistance.  12/28/2018, 12:59 PM

## 2018-12-29 LAB — RENAL FUNCTION PANEL
Albumin: 2.5 g/dL — ABNORMAL LOW (ref 3.5–5.0)
Anion gap: 11 (ref 5–15)
BUN: 50 mg/dL — ABNORMAL HIGH (ref 6–20)
CO2: 20 mmol/L — ABNORMAL LOW (ref 22–32)
Calcium: 6.8 mg/dL — ABNORMAL LOW (ref 8.9–10.3)
Chloride: 104 mmol/L (ref 98–111)
Creatinine, Ser: 6.28 mg/dL — ABNORMAL HIGH (ref 0.61–1.24)
GFR calc Af Amer: 11 mL/min — ABNORMAL LOW (ref >60)
GFR calc non Af Amer: 9 mL/min — ABNORMAL LOW (ref >60)
Glucose, Bld: 92 mg/dL (ref 70–99)
Phosphorus: 2.6 mg/dL (ref 2.5–4.6)
Potassium: 3.5 mmol/L (ref 3.5–5.1)
Sodium: 135 mmol/L (ref 135–145)

## 2018-12-29 LAB — CBC
HCT: 22.3 % — ABNORMAL LOW (ref 39.0–52.0)
HCT: 23.4 % — ABNORMAL LOW (ref 39.0–52.0)
Hemoglobin: 7.4 g/dL — ABNORMAL LOW (ref 13.0–17.0)
Hemoglobin: 7.6 g/dL — ABNORMAL LOW (ref 13.0–17.0)
MCH: 26.4 pg (ref 26.0–34.0)
MCH: 26.1 pg (ref 26.0–34.0)
MCHC: 33.2 g/dL (ref 30.0–36.0)
MCHC: 32.5 g/dL (ref 30.0–36.0)
MCV: 79.6 fL — ABNORMAL LOW (ref 80.0–100.0)
MCV: 80.4 fL (ref 80.0–100.0)
Platelets: 65 10*3/uL — ABNORMAL LOW (ref 150–400)
Platelets: 70 10*3/uL — ABNORMAL LOW (ref 150–400)
RBC: 2.8 MIL/uL — ABNORMAL LOW (ref 4.22–5.81)
RBC: 2.91 MIL/uL — ABNORMAL LOW (ref 4.22–5.81)
RDW: 15.9 % — ABNORMAL HIGH (ref 11.5–15.5)
RDW: 16.2 % — ABNORMAL HIGH (ref 11.5–15.5)
WBC: 7.9 10*3/uL (ref 4.0–10.5)
WBC: 9.3 10*3/uL (ref 4.0–10.5)
nRBC: 0 % (ref 0.0–0.2)
nRBC: 0 % (ref 0.0–0.2)

## 2018-12-29 LAB — GASTROINTESTINAL PANEL BY PCR, STOOL (REPLACES STOOL CULTURE)

## 2018-12-29 LAB — GLUCOSE, CAPILLARY
Glucose-Capillary: 90 mg/dL (ref 70–99)
Glucose-Capillary: 126 mg/dL — ABNORMAL HIGH (ref 70–99)
Glucose-Capillary: 112 mg/dL — ABNORMAL HIGH (ref 70–99)
Glucose-Capillary: 125 mg/dL — ABNORMAL HIGH (ref 70–99)

## 2018-12-29 LAB — CULTURE, BLOOD (SINGLE)
Culture: NO GROWTH
Special Requests: ADEQUATE

## 2018-12-29 LAB — MAGNESIUM: Magnesium: 1.7 mg/dL (ref 1.7–2.4)

## 2018-12-29 LAB — TACROLIMUS LEVEL
Tacrolimus (FK506) - LabCorp: NOT DETECTED ng/mL (ref 2.0–20.0)
Tacrolimus (FK506) - LabCorp: 3 ng/mL (ref 2.0–20.0)

## 2018-12-29 MED ORDER — LIDOCAINE-PRILOCAINE 2.5-2.5 % EX CREA: 1.0000 "application " | TOPICAL_CREAM | CUTANEOUS | Status: DC | PRN

## 2018-12-29 MED ORDER — HEPARIN SODIUM (PORCINE) 1000 UNIT/ML DIALYSIS: 1000.0000 [IU] | INTRAMUSCULAR | Status: DC | PRN

## 2018-12-29 MED ORDER — MAGNESIUM SULFATE 2 GM/50ML IV SOLN
2.0000 g | Freq: Once | INTRAVENOUS | Status: AC
  Administered 2018-12-29: 2 g via INTRAVENOUS
  Filled 2018-12-29: qty 50

## 2018-12-29 MED ORDER — PENTAFLUOROPROP-TETRAFLUOROETH EX AERO: 1.0000 "application " | INHALATION_SPRAY | CUTANEOUS | Status: DC | PRN

## 2018-12-29 MED ORDER — LIDOCAINE HCL (PF) 1 % IJ SOLN: 5.0000 mL | INTRAMUSCULAR | Status: DC | PRN

## 2018-12-29 MED ORDER — SODIUM CHLORIDE 0.9 % IV SOLN: 100.0000 mL | INTRAVENOUS | Status: DC | PRN

## 2018-12-29 MED ORDER — ACETAMINOPHEN 325 MG PO TABS
650.0000 mg | ORAL_TABLET | Freq: Four times a day (QID) | ORAL | Status: DC | PRN
  Administered 2018-12-29 – 2019-01-01 (×3): 650 mg via ORAL
  Filled 2018-12-29 (×2): qty 2

## 2018-12-29 MED ORDER — ALTEPLASE 2 MG IJ SOLR: 2.0000 mg | Freq: Once | INTRAMUSCULAR | Status: DC | PRN

## 2018-12-29 MED ORDER — ACETAMINOPHEN 325 MG PO TABS
ORAL_TABLET | ORAL | Status: AC
  Filled 2018-12-29: qty 2

## 2018-12-29 MED ORDER — INSULIN GLARGINE 100 UNIT/ML ~~LOC~~ SOLN
6.0000 [IU] | Freq: Every day | SUBCUTANEOUS | Status: DC
  Administered 2018-12-30 – 2019-01-02 (×4): 6 [IU] via SUBCUTANEOUS
  Filled 2018-12-29 (×4): qty 0.06

## 2018-12-29 MED ORDER — CHLORHEXIDINE GLUCONATE CLOTH 2 % EX PADS
6.0000 | MEDICATED_PAD | Freq: Every day | CUTANEOUS | Status: DC
  Administered 2018-12-29 – 2018-12-30 (×2): 6 via TOPICAL

## 2018-12-29 NOTE — Progress Notes (Signed)
Pt off floor to dialysis. Dialysis nurse updated on current status.

## 2018-12-29 NOTE — Progress Notes (Signed)
Transportation came to pick up patient for his hemodialysis.  Patient refused to go. He said he has to wait for his breakfast before he can tolerate dialysis.  Patient was educated that he will be given food to eat at dialysis, he insisted he must have his breakfast before he goes to dialysis. [employer] was called and they said the food will get to the floor at around 7:45am.

## 2018-12-29 NOTE — Progress Notes (Signed)
[name] Progress Note   Subjective:   Successful declot in IR yesterday.  For dialysis today. UOP documented 267m yesteday.  Cr 6.3 this morning. Continued diarrhea.  Tm 99, no subjective fever.  Objective Vitals:   12/28/18 2115 12/29/18 0101 12/29/18 0450 12/29/18 0600  BP: 123/77 124/84 131/73   Pulse: 87 86 92   Resp:      Temp: 98.9 F (37.2 C) 99.9 F (37.7 C) 99.8 F (37.7 C)   TempSrc: Oral Oral Oral   SpO2: 100% 99% 100%   Weight:    56.9 kg  Height:       Physical Exam General: lying comfortably in bed Heart:  RRR, no rub Lungs: normal WOB, no rales Abdomen: soft, NABS, no TTP Extremities: no edema Neuro: tremor of hands (he notes chronic), no asterixis Dialysis Access: L FA AVG +thrill  Additional Objective Labs: Basic Metabolic Panel: Recent Labs  Lab 12/27/18 0252 12/28/18 0329 12/28/18 1750 12/29/18 0342  NA 141 137 138 135  K 2.0* 2.3* 3.4* 3.5  CL 105 100 104 104  CO2 24 25 22  20*  GLUCOSE 131* 92 84 92  BUN 67* 56* 50* 50*  CREATININE 7.31* 6.63* 6.53* 6.28*  CALCIUM 6.6* 6.3* 6.9* 6.8*  PHOS 2.9 2.1*  --  2.6   Liver Function Tests: Recent Labs  Lab 12/24/18 1804  12/27/18 0252 12/28/18 0329 12/29/18 0342  AST 9*  --   --   --   --   ALT 12  --   --   --   --   ALKPHOS 113  --   --   --   --   BILITOT 0.3  --   --   --   --   PROT 7.3  --   --   --   --   ALBUMIN 4.5   < > 2.9* 2.6* 2.5*   < > = values in this interval not displayed.   Recent Labs  Lab 12/24/18 1804  LIPASE 768*   CBC: Recent Labs  Lab 12/25/18 0110 12/26/18 0754  12/27/18 0252 12/28/18 0329 12/29/18 0342  WBC 17.7* 6.8  --  8.9 9.4 7.9  HGB 9.1* 6.5*   < > 8.2* 7.5* 7.4*  HCT 28.5* 18.7*   < > 23.6* 22.7* 22.3*  MCV 83.3 77.6*  --  76.9* 78.0* 79.6*  PLT 167 95*  --  78* 63* 65*   < > = values in this interval not displayed.   Blood Culture    Component Value Date/Time   SDES BLOOD SITE NOT SPECIFIED 12/24/2018 1804    SPECREQUEST  12/24/2018 1804    BOTTLES DRAWN AEROBIC AND ANAEROBIC Blood Culture adequate volume   CULT  12/24/2018 1804    NO GROWTH 4 DAYS Performed at [facility] [id number] [place] [id number]   REPTSTATUS PENDING 12/24/2018 1804    Cardiac Enzymes: Recent Labs  Lab 12/24/18 1804 12/25/18 0110 12/25/18 0455  CKTOTAL  --  58  --   TROPONINI <0.03 <0.03 <0.03   CBG: Recent Labs  Lab 12/28/18 0837 12/28/18 1142 12/28/18 1615 12/28/18 2118 12/29/18 0824  GLUCAP 76 73 73 99 90   Iron Studies:  No results for input(s): IRON, TIBC, TRANSFERRIN, FERRITIN in the last 72 hours. @lablastinr3 @ Studies/Results: Ir [place]Guide Vasc Access Left  Result Date: 12/28/2018 INDICATION: History of end-stage renal disease with recently failed renal transplant. Patient previously received dialysis via  a left forearm graft which was most recently intervened on 12/22/2012. The left forearm dialysis graft has not been in use for the past 6 years however they were able to provide one session of dialysis via the graft earlier this week however unfortunately the graft has subsequently thrombosed. As such, patient presents now for attempted image guided thrombolysis of the chronic left forearm dialysis graft. EXAM: 1. FISTULALYSIS 2. ANGIOPLASTY OF VENOUS LIMB AND VENOUS ANASTOMOSIS 3. ULTRASOUND GUIDANCE FOR VENOUS ACCESS COMPARISON:  Left forearm graft declot-12/22/2012. MEDICATIONS: Heparin 3000 units IV; TPA 4 mg into graft. CONTRAST:  50 cc Isovue-300 ANESTHESIA/SEDATION: Moderate (conscious) sedation was employed during this procedure. A total of Versed 1.5 mg and Fentanyl 75 mcg was administered intravenously. Moderate Sedation Time: 56 minutes. The patient's level of consciousness and vital signs were monitored continuously by radiology nursing throughout the procedure under my direct supervision. FLUOROSCOPY TIME:  10 minutes, 24 seconds (11 mGy) COMPLICATIONS: None immediate.  TECHNIQUE: Informed written consent was obtained from the patient after a discussion of the risks, benefits and alternatives to treatment. Questions regarding the procedure were encouraged and answered. A timeout was performed prior to the initiation of the procedure. On physical examination, the existing left forearm arm dialysis graft was negative for palpable pulse or thrill. The skin overlying the graft was prepped and draped in the usual sterile fashion, and a sterile drape was applied covering the operative field. Maximum barrier sterile technique with sterile gowns and gloves were used for the procedure. Under ultrasound guidance, the dialysis graft was accessed directed towards the venous anastomosis with a micropuncture kit after the overlying soft tissues were anesthetized with 1% lidocaine. An ultrasound image was saved for documentation purposes. The micropuncture sheath was exchange for a 7-French vascular sheath over a guidewire. Over a Benson wire, a Kumpe catheter was advanced centrally and a central venogram was performed. Pull back venogram was performed with the Kumpe catheter. Heparin was administered systemically and TPA was administered via the Kumpe catheter throughout near the entirety of the venous limb. The venous anastomosis and majority of the venous limb was angioplastied with a 6 mm x 4 cm Conquest balloon. An additional access was obtained directed towards the arterial anastomoses with a micropuncture kit after the overlying soft tissues anesthetized with 1% lidocaine. This allowed for placement of a 6-French vascular sheath. The graft was thrombectomized with several rounds of push-pull mechanical thrombectomy with an occlusion balloon. Flow was restored to the graft as evidenced by restored pulsatility of the graft and brisk blood return from the side arm of the vascular sheath. Shuntograms were performed. Images were reviewed and the procedure was terminated. At this point, the  procedure was terminated. All wires, catheters and sheaths were removed from the patient. Hemostasis was achieved at both access sites with deployment of a swizzle sutures which will be removed at the patient's next dialysis session. Dressings were placed. The patient tolerated the procedure without immediate postprocedural complication. FINDINGS: The existing left forearm AV graft is thrombosed. The venous anastomosis and near the entirety of the venous limb was angioplastied to 6 mm diameter. The graft was successfully thrombectomized using mechanical and pharmacologic means as above. Completion shuntogram demonstrates the venous limb and anastomosis are widely patent. Small area of extravasation was noted at recent dialysis graft cannulation site however this was successfully treated with manual compression following the completion of procedure. The central venous system, arterial limb and arterial anastomosis are widely patent. IMPRESSION: 1. Technically successful left  forearm AV graft thrombolysis. 2. Successful angioplasty of the venous anastomosis and majority of the venous limb to 6 mm diameter. Completion shuntogram demonstrates the venous limb and anastomosis are widely patent. 3. The arterial anastomosis and arterial limb are widely patent. 4. The central venous system is widely patent. ACCESS: This graft would be amenable to future percutaneous intervention as clinically indicated. Electronically Signed   By: Sandi Mariscal M.D.   On: 12/28/2018 16:46   Ir Thrombectomy Av Fistula W/thrombolysis/pta Inc/shunt/img Left  Result Date: 12/28/2018 INDICATION: History of end-stage renal disease with recently failed renal transplant. Patient previously received dialysis via a left forearm graft which was most recently intervened on 12/22/2012. The left forearm dialysis graft has not been in use for the past 6 years however they were able to provide one session of dialysis via the graft earlier this week  however unfortunately the graft has subsequently thrombosed. As such, patient presents now for attempted image guided thrombolysis of the chronic left forearm dialysis graft. EXAM: 1. FISTULALYSIS 2. ANGIOPLASTY OF VENOUS LIMB AND VENOUS ANASTOMOSIS 3. ULTRASOUND GUIDANCE FOR VENOUS ACCESS COMPARISON:  Left forearm graft declot-12/22/2012. MEDICATIONS: Heparin 3000 units IV; TPA 4 mg into graft. CONTRAST:  50 cc Isovue-300 ANESTHESIA/SEDATION: Moderate (conscious) sedation was employed during this procedure. A total of Versed 1.5 mg and Fentanyl 75 mcg was administered intravenously. Moderate Sedation Time: 56 minutes. The patient's level of consciousness and vital signs were monitored continuously by radiology nursing throughout the procedure under my direct supervision. FLUOROSCOPY TIME:  10 minutes, 24 seconds (11 mGy) COMPLICATIONS: None immediate. TECHNIQUE: Informed written consent was obtained from the patient after a discussion of the risks, benefits and alternatives to treatment. Questions regarding the procedure were encouraged and answered. A timeout was performed prior to the initiation of the procedure. On physical examination, the existing left forearm arm dialysis graft was negative for palpable pulse or thrill. The skin overlying the graft was prepped and draped in the usual sterile fashion, and a sterile drape was applied covering the operative field. Maximum barrier sterile technique with sterile gowns and gloves were used for the procedure. Under ultrasound guidance, the dialysis graft was accessed directed towards the venous anastomosis with a micropuncture kit after the overlying soft tissues were anesthetized with 1% lidocaine. An ultrasound image was saved for documentation purposes. The micropuncture sheath was exchange for a 7-French vascular sheath over a guidewire. Over a Benson wire, a Kumpe catheter was advanced centrally and a central venogram was performed. Pull back venogram was  performed with the Kumpe catheter. Heparin was administered systemically and TPA was administered via the Kumpe catheter throughout near the entirety of the venous limb. The venous anastomosis and majority of the venous limb was angioplastied with a 6 mm x 4 cm Conquest balloon. An additional access was obtained directed towards the arterial anastomoses with a micropuncture kit after the overlying soft tissues anesthetized with 1% lidocaine. This allowed for placement of a 6-French vascular sheath. The graft was thrombectomized with several rounds of push-pull mechanical thrombectomy with an occlusion balloon. Flow was restored to the graft as evidenced by restored pulsatility of the graft and brisk blood return from the side arm of the vascular sheath. Shuntograms were performed. Images were reviewed and the procedure was terminated. At this point, the procedure was terminated. All wires, catheters and sheaths were removed from the patient. Hemostasis was achieved at both access sites with deployment of a swizzle sutures which will be removed at the patient's  next dialysis session. Dressings were placed. The patient tolerated the procedure without immediate postprocedural complication. FINDINGS: The existing left forearm AV graft is thrombosed. The venous anastomosis and near the entirety of the venous limb was angioplastied to 6 mm diameter. The graft was successfully thrombectomized using mechanical and pharmacologic means as above. Completion shuntogram demonstrates the venous limb and anastomosis are widely patent. Small area of extravasation was noted at recent dialysis graft cannulation site however this was successfully treated with manual compression following the completion of procedure. The central venous system, arterial limb and arterial anastomosis are widely patent. IMPRESSION: 1. Technically successful left forearm AV graft thrombolysis. 2. Successful angioplasty of the venous anastomosis and  majority of the venous limb to 6 mm diameter. Completion shuntogram demonstrates the venous limb and anastomosis are widely patent. 3. The arterial anastomosis and arterial limb are widely patent. 4. The central venous system is widely patent. ACCESS: This graft would be amenable to future percutaneous intervention as clinically indicated. Electronically Signed   By: Sandi Mariscal M.D.   On: 12/28/2018 16:46   Medications: . sodium chloride    . sodium chloride     . chlorhexidine  15 mL Mouth Rinse BID  . Chlorhexidine Gluconate Cloth  6 each Topical Q0600  . Chlorhexidine Gluconate Cloth  6 each Topical Q0600  . Chlorhexidine Gluconate Cloth  6 each Topical Q0600  . insulin aspart  0-5 Units Subcutaneous QHS  . insulin aspart  0-9 Units Subcutaneous TID WC  . insulin glargine  10 Units Subcutaneous Daily  . mouth rinse  15 mL Mouth Rinse q12n4p  . mycophenolate  720 mg Oral BID  . potassium chloride  40 mEq Oral TID  . tacrolimus  6 mg Oral Daily   And  . tacrolimus  5 mg Oral QHS     Assessment/Plan: The patient is a 48 y.o. year old male patient with a past medical history significant for end-stage renal disease status post renal transplantation, type 2 diabetes, chronic anemia, hyperlipidemia, and hypertension who presented to the emergency department with increasing shortness of breath for approximately 3 weeks with associated nausea and vomiting.  1.  End-stage renal disease status post renal transplantation.  His most recent office visit to [facility] transplant was in November 25, 2017.  He received a deceased donor renal transplant on 12/26/2013.  100% PRA, KDPII greater than 85.  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  His baseline creatinine as documented back in 2019 was about 2-3.  He is on maintenance immunosuppression with tacrolimus 6 in the morning and 5 at  night, and Myfortic 4 tabs twice daily.  He was previously on prednisone, but he reports that was discontinued.  Tac trough still pending.  2.  Acute kidney injury.  Unclear if this is acute GI issue leading to hypovolemia and AKI versus progression of underlying disease and uremia.  Cr 13 on admission, improved to 7.2 with 2 hrs of HD + fluids. Now remains in the 6s.  HD today 3hrs.  Likely renal biopsy early next week if lack of improvement.    3.  Diarrhea:  c diff negative.  Check CMV VL and GI viral pathogen panel -pending.  If persists will need to involve GI for potential colonoscopy given immunosuppressed status.   4.  Diabetic ketoacidosis.  Resolved.  A1c is 7.2.   5.  Secondary hyperparathyroidism.  PTH is within goal for CKD stage.  Phosphorus is 5.4.  We will continue to monitor.  6.  Anemia of chronic kidney disease. Iron replete. Hb 12.2 on presentation, down into 6s.  Rec'd 1 u pRBC 5/12 with appropriate increase.  Hb 7.4 this AM which I expect is due to blood loss in tubing yesterday.  5/13.  7.  Thrombocytopenia:  Chronic issue.  Plt have trended down during admission but in fact are now at his baseline.  I think a lot of the change this admission was hemoconcentration on presentation.    8.  Hypokalemia:  Finally improved, likely related to diarrhea. 4K dialysate today. CTM.  Jannifer Hick MD 12/29/2018, 9:21 AM  [facility] Pager: [phone]

## 2018-12-29 NOTE — Plan of Care (Signed)
  Problem: Education: Goal: Knowledge of General Education information will improve Description: Including pain rating scale, medication(s)/side effects and non-pharmacologic comfort measures Outcome: Progressing   Problem: Clinical Measurements: Goal: Ability to maintain clinical measurements within normal limits will improve Outcome: Progressing   

## 2018-12-29 NOTE — Plan of Care (Signed)
Patient is making progress toward discharge goal

## 2018-12-29 NOTE — Progress Notes (Signed)
PT Cancellation Note  Patient Details Name: [name] MRN: [id number] DOB: [birth year]   Cancelled Treatment:    Reason Eval/Treat Not Completed: Patient at procedure or test/unavailable (HD). Will follow-up for PT evaluation.  Mabeline Caras, PT, DPT Acute Rehabilitation Services  Pager [phone] Office [place] 12/29/2018, 2:04 PM

## 2018-12-29 NOTE — Progress Notes (Signed)
Patient refused HD treatment this evening, Dr Hollie Salk has been informed

## 2018-12-29 NOTE — Progress Notes (Signed)
PROGRESS NOTE        PATIENT DETAILS Name: [name] Age: 48 y.o. Sex: male Date of Birth: 1971-02-07 Admit Date: 12/24/2018 Admitting Physician Rise Patience, MD NGE:[id number], [name], NP  Brief Narrative: Patient is a 48 y.o. male with history of renal transplant, DM-2, HTN, dyslipidemia-who presented with DKA, AKI on CKD stage III.  See below for further details.  Subjective: Asking when he can go home-diarrhea improved.  Assessment/Plan: DKA: Resolved-on admission required insulin infusion-has been transitioned to subcutaneous insulin.  Severe metabolic acidosis: Felt to be a combination of DKA, AKI.  Improved after resolution of DKA and HD.  AKI on CKD stage IV: Has history of renal transplantation-AKI thought to be progression of underlying CKD, nephrology following-not sure at this time whether patient is ESRD and will require dialysis on discharge.  Nephrology contemplating performing a renal biopsy sometime next week if still in significant amount of kidney failure.  Nephrology following and directing care  Diarrhea: Improved-C. difficile PCR negative, GI pathogen panel pending.  Stool cultures negative so far.  CMV PCR pending.  History of renal transplantation: Remains on maintenance immunosuppression with tacrolimus and Myfortic.    Hypokalemia: Improved-suspect this was secondary to GI loss from diarrhea.  Thrombocytopenia: Appears to be a chronic issue-has had thrombocytopenia going back to 2019.  No evidence of bleeding-follow for now.  Count stable over the past few days-continue to follow closely.  Anemia: Likely secondary to chronic kidney disease-no evidence of blood loss-globin remained stable over the past few days-continue to follow CBC closely.    Insulin-dependent DM-2: CBGs stable side-decrease Lantus to 6 units, continue SSI.,  Follow.  Prolonged QTc interval: Resolved-probably secondary to hypokalemia  DVT  Prophylaxis: SCDs-stop heparin given worsening thrombocytopenia  Code Status: Full code  Family Communication: None at bedside  Disposition Plan: Remain inpatient  Antimicrobial agents: Anti-infectives (From admission, onward)   None      Procedures: None  CONSULTS:  nephrology  Time spent: 25 minutes-Greater than 50% of this time was spent in counseling, explanation of diagnosis, planning of further management, and coordination of care.  MEDICATIONS: Scheduled Meds:  chlorhexidine  15 mL Mouth Rinse BID   Chlorhexidine Gluconate Cloth  6 each Topical Q0600   Chlorhexidine Gluconate Cloth  6 each Topical Q0600   Chlorhexidine Gluconate Cloth  6 each Topical Q0600   insulin aspart  0-5 Units Subcutaneous QHS   insulin aspart  0-9 Units Subcutaneous TID WC   insulin glargine  10 Units Subcutaneous Daily   mouth rinse  15 mL Mouth Rinse q12n4p   mycophenolate  720 mg Oral BID   tacrolimus  6 mg Oral Daily   And   tacrolimus  5 mg Oral QHS   Continuous Infusions:  sodium chloride     sodium chloride     PRN Meds:.sodium chloride, sodium chloride, acetaminophen, alteplase, heparin, iohexol, lidocaine (PF), lidocaine-prilocaine, loperamide, pentafluoroprop-tetrafluoroeth, phenol, promethazine   PHYSICAL EXAM: Vital signs: Vitals:   12/28/18 2115 12/29/18 0101 12/29/18 0450 12/29/18 0600  BP: 123/77 124/84 131/73   Pulse: 87 86 92   Resp:      Temp: 98.9 F (37.2 C) 99.9 F (37.7 C) 99.8 F (37.7 C)   TempSrc: Oral Oral Oral   SpO2: 100% 99% 100%   Weight:    56.9 kg  Height:  Filed Weights   12/27/18 0553 12/27/18 1330 12/29/18 0600  Weight: 57.2 kg 58.5 kg 56.9 kg   Body mass index is 20.87 kg/m.   General appearance:Awake, alert, not in any distress.  Eyes:no scleral icterus. HEENT: Atraumatic and Normocephalic Neck: supple, no JVD. Resp:Good air entry bilaterally,no rales or rhonchi CVS: S1 S2 regular GI: Bowel sounds  present, Non tender and not distended with no gaurding, rigidity or rebound. Extremities: B/L Lower Ext shows no edema, both legs are warm to touch Neurology:  Non focal Musculoskeletal:No digital cyanosis Skin:No Rash, warm and dry Wounds:N/A  I have personally reviewed following labs and imaging studies  LABORATORY DATA: CBC: Recent Labs  Lab 12/25/18 0110 12/26/18 0754 12/26/18 1704 12/27/18 0252 12/28/18 0329 12/29/18 0342  WBC 17.7* 6.8  --  8.9 9.4 7.9  HGB 9.1* 6.5* 8.0* 8.2* 7.5* 7.4*  HCT 28.5* 18.7* 22.9* 23.6* 22.7* 22.3*  MCV 83.3 77.6*  --  76.9* 78.0* 79.6*  PLT 167 95*  --  78* 63* 65*    Basic Metabolic Panel: Recent Labs  Lab 12/25/18 0110  12/25/18 1340  12/26/18 0754 12/27/18 0252 12/27/18 0732 12/28/18 0329 12/28/18 1750 12/29/18 0342  NA 137   < > 143   < > 142 141  --  137 138 135  K 4.1   < > 4.5   < > 2.1* 2.0*  --  2.3* 3.4* 3.5  CL 117*   < > 127*   < > 113* 105  --  100 104 104  CO2 <7*   < > <7*   < > 17* 24  --  25 22 20*  GLUCOSE 244*   < > 189*   < > 99 131*  --  92 84 92  BUN 122*   < > 120*   < > 68* 67*  --  56* 50* 50*  CREATININE 11.90*   < > 10.75*   < > 7.19* 7.31*  --  6.63* 6.53* 6.28*  CALCIUM 7.9*   < > 7.3*   < > 7.2* 6.6*  --  6.3* 6.9* 6.8*  MG 1.8  --  1.5*  --   --   --  1.5*  --   --  1.7  PHOS  --   --   --   --  2.7 2.9  --  2.1*  --  2.6   < > = values in this interval not displayed.    GFR: Estimated Creatinine Clearance: 11.3 mL/min (A) (by C-G formula based on SCr of 6.28 mg/dL (H)).  Liver Function Tests: Recent Labs  Lab 12/24/18 1804 12/26/18 0754 12/27/18 0252 12/28/18 0329 12/29/18 0342  AST 9*  --   --   --   --   ALT 12  --   --   --   --   ALKPHOS 113  --   --   --   --   BILITOT 0.3  --   --   --   --   PROT 7.3  --   --   --   --   ALBUMIN 4.5 3.0* 2.9* 2.6* 2.5*   Recent Labs  Lab 12/24/18 1804  LIPASE 768*   No results for input(s): AMMONIA in the last 168 hours.  Coagulation  Profile: Recent Labs  Lab 12/26/18 0754  INR 1.2    Cardiac Enzymes: Recent Labs  Lab 12/24/18 1804 12/25/18 0110 12/25/18 0455  CKTOTAL  --  58  --  TROPONINI <0.03 <0.03 <0.03    BNP (last 3 results) No results for input(s): PROBNP in the last 8760 hours.  HbA1C: No results for input(s): HGBA1C in the last 72 hours.  CBG: Recent Labs  Lab 12/28/18 0837 12/28/18 1142 12/28/18 1615 12/28/18 2118 12/29/18 0824  GLUCAP 76 73 73 99 90    Lipid Profile: No results for input(s): CHOL, HDL, LDLCALC, TRIG, CHOLHDL, LDLDIRECT in the last 72 hours.  Thyroid Function Tests: No results for input(s): TSH, T4TOTAL, FREET4, T3FREE, THYROIDAB in the last 72 hours.  Anemia Panel: No results for input(s): VITAMINB12, FOLATE, FERRITIN, TIBC, IRON, RETICCTPCT in the last 72 hours.  Urine analysis:    Component Value Date/Time   COLORURINE YELLOW 12/25/2018 0200   APPEARANCEUR CLOUDY (A) 12/25/2018 0200   LABSPEC 1.012 12/25/2018 0200   PHURINE 5.0 12/25/2018 0200   GLUCOSEU NEGATIVE 12/25/2018 0200   HGBUR MODERATE (A) 12/25/2018 0200   BILIRUBINUR NEGATIVE 12/25/2018 0200   KETONESUR NEGATIVE 12/25/2018 0200   PROTEINUR 30 (A) 12/25/2018 0200   UROBILINOGEN 0.2 12/23/2008 1845   NITRITE NEGATIVE 12/25/2018 0200   LEUKOCYTESUR NEGATIVE 12/25/2018 0200    Sepsis Labs: Lactic Acid, Venous    Component Value Date/Time   LATICACIDVEN 2.2 ([place]) 12/24/2018 1804    MICROBIOLOGY: Recent Results (from the past 240 hour(s))  Culture, blood (single)     Status: None   Collection Time: 12/24/18  6:04 PM  Result Value Ref Range Status   Specimen Description BLOOD SITE NOT SPECIFIED  Final   Special Requests   Final    BOTTLES DRAWN AEROBIC AND ANAEROBIC Blood Culture adequate volume   Culture   Final    NO GROWTH 5 DAYS Performed at [facility], [place] [address] [place], [place] [address]    Report Status 12/29/2018 FINAL  Final  SARS Coronavirus 2 (CEPHEID -  Performed in [place] hospital lab), Hosp Order     Status: None   Collection Time: 12/24/18  6:19 PM  Result Value Ref Range Status   SARS Coronavirus 2 NEGATIVE NEGATIVE Final    Comment: (NOTE) If result is NEGATIVE SARS-CoV-2 target nucleic acids are NOT DETECTED. The SARS-CoV-2 RNA is generally detectable in upper and lower  respiratory specimens during the acute phase of infection. The lowest  concentration of SARS-CoV-2 viral copies this assay can detect is 250  copies / mL. A negative result does not preclude SARS-CoV-2 infection  and should not be used as the sole basis for treatment or other  patient management decisions.  A negative result may occur with  improper specimen collection / handling, submission of specimen other  than nasopharyngeal swab, presence of viral mutation(s) within the  areas targeted by this assay, and inadequate number of viral copies  (<250 copies / mL). A negative result must be combined with clinical  observations, patient history, and epidemiological information. If result is POSITIVE SARS-CoV-2 target nucleic acids are DETECTED. The SARS-CoV-2 RNA is generally detectable in upper and lower  respiratory specimens dur ing the acute phase of infection.  Positive  results are indicative of active infection with SARS-CoV-2.  Clinical  correlation with patient history and other diagnostic information is  necessary to determine patient infection status.  Positive results do  not rule out bacterial infection or co-infection with other viruses. If result is PRESUMPTIVE POSTIVE SARS-CoV-2 nucleic acids MAY BE PRESENT.   A presumptive positive result was obtained on the submitted specimen  and confirmed on repeat  testing.  While 2019 novel coronavirus  (SARS-CoV-2) nucleic acids may be present in the submitted sample  additional confirmatory testing may be necessary for epidemiological  and / or clinical management purposes  to differentiate between   SARS-CoV-2 and other Sarbecovirus currently known to infect humans.  If clinically indicated additional testing with an alternate test  methodology [phone]) is advised. The SARS-CoV-2 RNA is generally  detectable in upper and lower respiratory sp ecimens during the acute  phase of infection. The expected result is Negative. Fact Sheet for Patients:  StrictlyIdeas.no Fact Sheet for Healthcare Providers: BankingDealers.co.za This test is not yet approved or cleared by the [place] FDA and has been authorized for detection and/or diagnosis of SARS-CoV-2 by FDA under an Emergency Use Authorization (EUA).  This EUA will remain in effect (meaning this test can be used) for the duration of the COVID-19 declaration under Section 564(b)(1) of the Act, 21 U.S.C. section 360bbb-3(b)(1), unless the authorization is terminated or revoked sooner. Performed at [facility], [place] [address] [place], [place] [address]   Stool culture (children & immunocomp patients)     Status: None (Preliminary result)   Collection Time: 12/25/18  7:20 PM  Result Value Ref Range Status   Salmonella/Shigella Screen Final report  Final   Campylobacter Culture PENDING  Incomplete   E coli, Shiga toxin Assay Negative Negative Final    Comment: (NOTE) Performed At: [facility] [place], [place] [id number] Rush Farmer MD FY:[id number]   C difficile quick scan w PCR reflex     Status: None   Collection Time: 12/25/18  7:20 PM  Result Value Ref Range Status   C Diff antigen NEGATIVE NEGATIVE Final   C Diff toxin NEGATIVE NEGATIVE Final   C Diff interpretation No C. difficile detected.  Final    Comment: Performed at [facility], [place] [address], [place], [place] [address]  STOOL CULTURE REFLEX - RSASHR     Status: None   Collection Time: 12/25/18  7:20 PM  Result Value Ref Range Status   Stool Culture result 1 (RSASHR)  Comment  Final    Comment: (NOTE) No Salmonella or Shigella recovered. Performed At: [facility] [place], [place] [id number] Rush Farmer MD ER:[id number]     RADIOLOGY STUDIES/RESULTS: Ir US Guide Vasc Access Left  Result Date: 12/28/2018 INDICATION: History of end-stage renal disease with recently failed renal transplant. Patient previously received dialysis via a left forearm graft which was most recently intervened on 12/22/2012. The left forearm dialysis graft has not been in use for the past 6 years however they were able to provide one session of dialysis via the graft earlier this week however unfortunately the graft has subsequently thrombosed. As such, patient presents now for attempted image guided thrombolysis of the chronic left forearm dialysis graft. EXAM: 1. FISTULALYSIS 2. ANGIOPLASTY OF VENOUS LIMB AND VENOUS ANASTOMOSIS 3. ULTRASOUND GUIDANCE FOR VENOUS ACCESS COMPARISON:  Left forearm graft declot-12/22/2012. MEDICATIONS: Heparin 3000 units IV; TPA 4 mg into graft. CONTRAST:  50 cc Isovue-300 ANESTHESIA/SEDATION: Moderate (conscious) sedation was employed during this procedure. A total of Versed 1.5 mg and Fentanyl 75 mcg was administered intravenously. Moderate Sedation Time: 56 minutes. The patient's level of consciousness and vital signs were monitored continuously by radiology nursing throughout the procedure under my direct supervision. FLUOROSCOPY TIME:  10 minutes, 24 seconds (11 mGy) COMPLICATIONS: None immediate. TECHNIQUE: Informed written consent was obtained from the patient after a  discussion of the risks, benefits and alternatives to treatment. Questions regarding the procedure were encouraged and answered. A timeout was performed prior to the initiation of the procedure. On physical examination, the existing left forearm arm dialysis graft was negative for palpable pulse or thrill. The skin overlying the graft was prepped and draped in the  usual sterile fashion, and a sterile drape was applied covering the operative field. Maximum barrier sterile technique with sterile gowns and gloves were used for the procedure. Under ultrasound guidance, the dialysis graft was accessed directed towards the venous anastomosis with a micropuncture kit after the overlying soft tissues were anesthetized with 1% lidocaine. An ultrasound image was saved for documentation purposes. The micropuncture sheath was exchange for a 7-French vascular sheath over a guidewire. Over a Benson wire, a Kumpe catheter was advanced centrally and a central venogram was performed. Pull back venogram was performed with the Kumpe catheter. Heparin was administered systemically and TPA was administered via the Kumpe catheter throughout near the entirety of the venous limb. The venous anastomosis and majority of the venous limb was angioplastied with a 6 mm x 4 cm Conquest balloon. An additional access was obtained directed towards the arterial anastomoses with a micropuncture kit after the overlying soft tissues anesthetized with 1% lidocaine. This allowed for placement of a 6-French vascular sheath. The graft was thrombectomized with several rounds of push-pull mechanical thrombectomy with an occlusion balloon. Flow was restored to the graft as evidenced by restored pulsatility of the graft and brisk blood return from the side arm of the vascular sheath. Shuntograms were performed. Images were reviewed and the procedure was terminated. At this point, the procedure was terminated. All wires, catheters and sheaths were removed from the patient. Hemostasis was achieved at both access sites with deployment of a swizzle sutures which will be removed at the patient's next dialysis session. Dressings were placed. The patient tolerated the procedure without immediate postprocedural complication. FINDINGS: The existing left forearm AV graft is thrombosed. The venous anastomosis and near the entirety  of the venous limb was angioplastied to 6 mm diameter. The graft was successfully thrombectomized using mechanical and pharmacologic means as above. Completion shuntogram demonstrates the venous limb and anastomosis are widely patent. Small area of extravasation was noted at recent dialysis graft cannulation site however this was successfully treated with manual compression following the completion of procedure. The central venous system, arterial limb and arterial anastomosis are widely patent. IMPRESSION: 1. Technically successful left forearm AV graft thrombolysis. 2. Successful angioplasty of the venous anastomosis and majority of the venous limb to 6 mm diameter. Completion shuntogram demonstrates the venous limb and anastomosis are widely patent. 3. The arterial anastomosis and arterial limb are widely patent. 4. The central venous system is widely patent. ACCESS: This graft would be amenable to future percutaneous intervention as clinically indicated. Electronically Signed   By: Sandi Mariscal M.D.   On: 12/28/2018 16:46   Dg Chest Port 1 View  Result Date: 12/24/2018 CLINICAL DATA:  Tachypnea EXAM: PORTABLE CHEST 1 VIEW COMPARISON:  01/18/2018 FINDINGS: The heart size and mediastinal contours are within normal limits. Both lungs are clear. The visualized skeletal structures are unremarkable. IMPRESSION: No acute abnormality of the lungs in AP portable projection. Electronically Signed   By: Eddie Candle M.D.   On: 12/24/2018 18:41   Ir Thrombectomy Av Fistula W/thrombolysis/pta Inc/shunt/img Left  Result Date: 12/28/2018 INDICATION: History of end-stage renal disease with recently failed renal transplant. Patient previously received dialysis via  a left forearm graft which was most recently intervened on 12/22/2012. The left forearm dialysis graft has not been in use for the past 6 years however they were able to provide one session of dialysis via the graft earlier this week however unfortunately the  graft has subsequently thrombosed. As such, patient presents now for attempted image guided thrombolysis of the chronic left forearm dialysis graft. EXAM: 1. FISTULALYSIS 2. ANGIOPLASTY OF VENOUS LIMB AND VENOUS ANASTOMOSIS 3. ULTRASOUND GUIDANCE FOR VENOUS ACCESS COMPARISON:  Left forearm graft declot-12/22/2012. MEDICATIONS: Heparin 3000 units IV; TPA 4 mg into graft. CONTRAST:  50 cc Isovue-300 ANESTHESIA/SEDATION: Moderate (conscious) sedation was employed during this procedure. A total of Versed 1.5 mg and Fentanyl 75 mcg was administered intravenously. Moderate Sedation Time: 56 minutes. The patient's level of consciousness and vital signs were monitored continuously by radiology nursing throughout the procedure under my direct supervision. FLUOROSCOPY TIME:  10 minutes, 24 seconds (11 mGy) COMPLICATIONS: None immediate. TECHNIQUE: Informed written consent was obtained from the patient after a discussion of the risks, benefits and alternatives to treatment. Questions regarding the procedure were encouraged and answered. A timeout was performed prior to the initiation of the procedure. On physical examination, the existing left forearm arm dialysis graft was negative for palpable pulse or thrill. The skin overlying the graft was prepped and draped in the usual sterile fashion, and a sterile drape was applied covering the operative field. Maximum barrier sterile technique with sterile gowns and gloves were used for the procedure. Under ultrasound guidance, the dialysis graft was accessed directed towards the venous anastomosis with a micropuncture kit after the overlying soft tissues were anesthetized with 1% lidocaine. An ultrasound image was saved for documentation purposes. The micropuncture sheath was exchange for a 7-French vascular sheath over a guidewire. Over a Benson wire, a Kumpe catheter was advanced centrally and a central venogram was performed. Pull back venogram was performed with the Kumpe  catheter. Heparin was administered systemically and TPA was administered via the Kumpe catheter throughout near the entirety of the venous limb. The venous anastomosis and majority of the venous limb was angioplastied with a 6 mm x 4 cm Conquest balloon. An additional access was obtained directed towards the arterial anastomoses with a micropuncture kit after the overlying soft tissues anesthetized with 1% lidocaine. This allowed for placement of a 6-French vascular sheath. The graft was thrombectomized with several rounds of push-pull mechanical thrombectomy with an occlusion balloon. Flow was restored to the graft as evidenced by restored pulsatility of the graft and brisk blood return from the side arm of the vascular sheath. Shuntograms were performed. Images were reviewed and the procedure was terminated. At this point, the procedure was terminated. All wires, catheters and sheaths were removed from the patient. Hemostasis was achieved at both access sites with deployment of a swizzle sutures which will be removed at the patient's next dialysis session. Dressings were placed. The patient tolerated the procedure without immediate postprocedural complication. FINDINGS: The existing left forearm AV graft is thrombosed. The venous anastomosis and near the entirety of the venous limb was angioplastied to 6 mm diameter. The graft was successfully thrombectomized using mechanical and pharmacologic means as above. Completion shuntogram demonstrates the venous limb and anastomosis are widely patent. Small area of extravasation was noted at recent dialysis graft cannulation site however this was successfully treated with manual compression following the completion of procedure. The central venous system, arterial limb and arterial anastomosis are widely patent. IMPRESSION: 1. Technically successful left  forearm AV graft thrombolysis. 2. Successful angioplasty of the venous anastomosis and majority of the venous limb to 6  mm diameter. Completion shuntogram demonstrates the venous limb and anastomosis are widely patent. 3. The arterial anastomosis and arterial limb are widely patent. 4. The central venous system is widely patent. ACCESS: This graft would be amenable to future percutaneous intervention as clinically indicated. Electronically Signed   By: Sandi Mariscal M.D.   On: 12/28/2018 16:46   Ct Renal Stone Study  Result Date: 12/25/2018 CLINICAL DATA:  Flank pain, history of renal transplant EXAM: CT ABDOMEN AND PELVIS WITHOUT CONTRAST TECHNIQUE: Multidetector CT imaging of the abdomen and pelvis was performed following the standard protocol without IV contrast. COMPARISON:  None. FINDINGS: Examination of the abdomen pelvis is generally limited by motion artifact throughout. Lower chest: No acute abnormality. Hepatobiliary: No focal liver abnormality is seen. The gallbladder is mildly distended and may contain dependent sludge; evaluation is particularly limited by breath motion artifact through this level. Pancreas: Unremarkable. No pancreatic ductal dilatation or surrounding inflammatory changes. Spleen: Normal in size without focal abnormality. Adrenals/Urinary Tract: Adrenal glands are unremarkable. 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 No hydronephrosis or ureteral calculus. 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 Bladder is unremarkable. Stomach/Bowel: Stomach is within normal limits. Appendix appears normal. The colon is fluid-filled to the rectum. Vascular/Lymphatic: No significant vascular findings are present. No enlarged abdominal or pelvic lymph nodes. Reproductive: No mass or other abnormality. Other: No abdominal wall hernia or abnormality. No abdominopelvic ascites. Musculoskeletal: No acute or significant osseous findings. IMPRESSION: 1. Examination of  the abdomen and pelvis is generally limited by motion artifact throughout. Within this limitation, no definite non-contrast CT findings to explain lower abdominal pain. 2. The gallbladder is mildly distended and may contain dependent sludge; evaluation is particularly limited by breath motion artifact through this level. 3. 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 No hydronephrosis or ureteral calculus. 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 4. The colon is fluid-filled to the rectum, in keeping with diarrheal illness. Electronically Signed   By: Eddie Candle M.D.   On: 12/25/2018 08:43     LOS: 5 days   Oren Binet, MD  [facility]  If 7PM-7AM, please contact night-coverage  Please page via [email or web address]  Go to [email or web address] and use [place]'s universal password to access. If you do not have the password, please contact the hospital operator.  Locate the [facility] provider you are looking for under [facility] and page to a number that you can be directly reached. If you still have difficulty reaching the provider, please page the [facility] (Director on Call) for the Hospitalists listed on amion for assistance.  12/29/2018, 10:46 AM

## 2018-12-29 NOTE — Plan of Care (Signed)
  Problem: Clinical Measurements: Goal: Respiratory complications will improve Outcome: Progressing   Problem: Activity: Goal: Risk for activity intolerance will decrease Outcome: Progressing   Problem: Nutrition: Goal: Adequate nutrition will be maintained Outcome: Progressing   Problem: Elimination: Goal: Will not experience complications related to bowel motility Outcome: Progressing   Problem: Pain Managment: Goal: General experience of comfort will improve Outcome: Progressing   Problem: Safety: Goal: Ability to remain free from injury will improve Outcome: Progressing

## 2018-12-30 LAB — CBC
HCT: 22.5 % — ABNORMAL LOW (ref 39.0–52.0)
Hemoglobin: 7.2 g/dL — ABNORMAL LOW (ref 13.0–17.0)
MCH: 25.7 pg — ABNORMAL LOW (ref 26.0–34.0)
MCHC: 32 g/dL (ref 30.0–36.0)
MCV: 80.4 fL (ref 80.0–100.0)
Platelets: 74 10*3/uL — ABNORMAL LOW (ref 150–400)
RBC: 2.8 MIL/uL — ABNORMAL LOW (ref 4.22–5.81)
RDW: 15.9 % — ABNORMAL HIGH (ref 11.5–15.5)
WBC: 7.2 10*3/uL (ref 4.0–10.5)
nRBC: 0 % (ref 0.0–0.2)

## 2018-12-30 LAB — GLUCOSE, CAPILLARY
Glucose-Capillary: 88 mg/dL (ref 70–99)
Glucose-Capillary: 149 mg/dL — ABNORMAL HIGH (ref 70–99)
Glucose-Capillary: 107 mg/dL — ABNORMAL HIGH (ref 70–99)
Glucose-Capillary: 120 mg/dL — ABNORMAL HIGH (ref 70–99)

## 2018-12-30 LAB — MAGNESIUM: Magnesium: 2.2 mg/dL (ref 1.7–2.4)

## 2018-12-30 LAB — TACROLIMUS LEVEL: Tacrolimus (FK506) - LabCorp: 4.1 ng/mL (ref 2.0–20.0)

## 2018-12-30 LAB — STOOL CULTURE REFLEX - RSASHR

## 2018-12-30 LAB — STOOL CULTURE: E coli, Shiga toxin Assay: NEGATIVE

## 2018-12-30 LAB — STOOL CULTURE REFLEX - CMPCXR

## 2018-12-30 NOTE — Progress Notes (Signed)
[facility] Progress Note   Subjective:   HD yesterday via AVG. Afebrile. No BMs since yesterday.  Overall feeling much improved.   Objective Vitals:   12/29/18 1612 12/29/18 2138 12/30/18 0604 12/30/18 0605  BP: 122/73 112/61 128/77   Pulse: 95 86 90   Resp:      Temp: 98.3 F (36.8 C) 98.6 F (37 C) 98.9 F (37.2 C)   TempSrc: Oral Oral Oral   SpO2: 99% 100% 99%   Weight: 56.2 kg   58.8 kg  Height:       Physical Exam General: lying comfortably in bed Heart:  RRR, no rub Lungs: normal WOB, no rales Abdomen: soft, NABS, no TTP Extremities: no edema Neuro: tremor of hands (he notes chronic), no asterixis Dialysis Access: L FA AVG +thrill  Additional Objective Labs: Basic Metabolic Panel: Recent Labs  Lab 12/28/18 0329 12/28/18 1750 12/29/18 0342 12/30/18 0256  NA 137 138 135 137  K 2.3* 3.4* 3.5 3.5  CL 100 104 104 102  CO2 25 22 20* 25  GLUCOSE 92 84 92 131*  BUN 56* 50* 50* 21*  CREATININE 6.63* 6.53* 6.28* 3.53*  CALCIUM 6.3* 6.9* 6.8* 7.2*  PHOS 2.1*  --  2.6 2.1*   Liver Function Tests: Recent Labs  Lab 12/24/18 1804  12/28/18 0329 12/29/18 0342 12/30/18 0256  AST 9*  --   --   --   --   ALT 12  --   --   --   --   ALKPHOS 113  --   --   --   --   BILITOT 0.3  --   --   --   --   PROT 7.3  --   --   --   --   ALBUMIN 4.5   < > 2.6* 2.5* 2.6*   < > = values in this interval not displayed.   Recent Labs  Lab 12/24/18 1804  LIPASE 768*   CBC: Recent Labs  Lab 12/27/18 0252 12/28/18 0329 12/29/18 0342 12/29/18 0944 12/30/18 0256  WBC 8.9 9.4 7.9 9.3 7.2  HGB 8.2* 7.5* 7.4* 7.6* 7.2*  HCT 23.6* 22.7* 22.3* 23.4* 22.5*  MCV 76.9* 78.0* 79.6* 80.4 80.4  PLT 78* 63* 65* 70* 74*   Blood Culture    Component Value Date/Time   SDES BLOOD SITE NOT SPECIFIED 12/24/2018 1804   SPECREQUEST  12/24/2018 1804    BOTTLES DRAWN AEROBIC AND ANAEROBIC Blood Culture adequate volume   CULT  12/24/2018 1804    NO GROWTH 5  DAYS Performed at [facility], [place] [address] [place], [place] [address]    REPTSTATUS 12/29/2018 FINAL 12/24/2018 1804    Cardiac Enzymes: Recent Labs  Lab 12/24/18 1804 12/25/18 0110 12/25/18 0455  CKTOTAL  --  58  --   TROPONINI <0.03 <0.03 <0.03   CBG: Recent Labs  Lab 12/29/18 0824 12/29/18 1202 12/29/18 1736 12/29/18 2140 12/30/18 0748  GLUCAP 90 126* 112* 125* 88   Iron Studies:  No results for input(s): IRON, TIBC, TRANSFERRIN, FERRITIN in the last 72 hours. _0 @ Studies/Results: Ir US Guide Vasc Access Left  Result Date: 12/28/2018 INDICATION: History of end-stage renal disease with recently failed renal transplant. Patient previously received dialysis via a left forearm graft which was most recently intervened on 12/22/2012. The left forearm dialysis graft has not been in use for the past 6 years however they were able to provide one session of dialysis via the  graft earlier this week however unfortunately the graft has subsequently thrombosed. As such, patient presents now for attempted image guided thrombolysis of the chronic left forearm dialysis graft. EXAM: 1. FISTULALYSIS 2. ANGIOPLASTY OF VENOUS LIMB AND VENOUS ANASTOMOSIS 3. ULTRASOUND GUIDANCE FOR VENOUS ACCESS COMPARISON:  Left forearm graft declot-12/22/2012. MEDICATIONS: Heparin 3000 units IV; TPA 4 mg into graft. CONTRAST:  50 cc Isovue-300 ANESTHESIA/SEDATION: Moderate (conscious) sedation was employed during this procedure. A total of Versed 1.5 mg and Fentanyl 75 mcg was administered intravenously. Moderate Sedation Time: 56 minutes. The patient's level of consciousness and vital signs were monitored continuously by radiology nursing throughout the procedure under my direct supervision. FLUOROSCOPY TIME:  10 minutes, 24 seconds (11 mGy) COMPLICATIONS: None immediate. TECHNIQUE: Informed written consent was obtained from the patient after a discussion of the risks, benefits and alternatives to  treatment. Questions regarding the procedure were encouraged and answered. A timeout was performed prior to the initiation of the procedure. On physical examination, the existing left forearm arm dialysis graft was negative for palpable pulse or thrill. The skin overlying the graft was prepped and draped in the usual sterile fashion, and a sterile drape was applied covering the operative field. Maximum barrier sterile technique with sterile gowns and gloves were used for the procedure. Under ultrasound guidance, the dialysis graft was accessed directed towards the venous anastomosis with a micropuncture kit after the overlying soft tissues were anesthetized with 1% lidocaine. An ultrasound image was saved for documentation purposes. The micropuncture sheath was exchange for a 7-French vascular sheath over a guidewire. Over a Benson wire, a Kumpe catheter was advanced centrally and a central venogram was performed. Pull back venogram was performed with the Kumpe catheter. Heparin was administered systemically and TPA was administered via the Kumpe catheter throughout near the entirety of the venous limb. The venous anastomosis and majority of the venous limb was angioplastied with a 6 mm x 4 cm Conquest balloon. An additional access was obtained directed towards the arterial anastomoses with a micropuncture kit after the overlying soft tissues anesthetized with 1% lidocaine. This allowed for placement of a 6-French vascular sheath. The graft was thrombectomized with several rounds of push-pull mechanical thrombectomy with an occlusion balloon. Flow was restored to the graft as evidenced by restored pulsatility of the graft and brisk blood return from the side arm of the vascular sheath. Shuntograms were performed. Images were reviewed and the procedure was terminated. At this point, the procedure was terminated. All wires, catheters and sheaths were removed from the patient. Hemostasis was achieved at both access  sites with deployment of a swizzle sutures which will be removed at the patient's next dialysis session. Dressings were placed. The patient tolerated the procedure without immediate postprocedural complication. FINDINGS: The existing left forearm AV graft is thrombosed. The venous anastomosis and near the entirety of the venous limb was angioplastied to 6 mm diameter. The graft was successfully thrombectomized using mechanical and pharmacologic means as above. Completion shuntogram demonstrates the venous limb and anastomosis are widely patent. Small area of extravasation was noted at recent dialysis graft cannulation site however this was successfully treated with manual compression following the completion of procedure. The central venous system, arterial limb and arterial anastomosis are widely patent. IMPRESSION: 1. Technically successful left forearm AV graft thrombolysis. 2. Successful angioplasty of the venous anastomosis and majority of the venous limb to 6 mm diameter. Completion shuntogram demonstrates the venous limb and anastomosis are widely patent. 3. The arterial anastomosis and arterial  limb are widely patent. 4. The central venous system is widely patent. ACCESS: This graft would be amenable to future percutaneous intervention as clinically indicated. Electronically Signed   By: Sandi Mariscal M.D.   On: 12/28/2018 16:46   Ir Thrombectomy Av Fistula W/thrombolysis/pta Inc/shunt/img Left  Result Date: 12/28/2018 INDICATION: History of end-stage renal disease with recently failed renal transplant. Patient previously received dialysis via a left forearm graft which was most recently intervened on 12/22/2012. The left forearm dialysis graft has not been in use for the past 6 years however they were able to provide one session of dialysis via the graft earlier this week however unfortunately the graft has subsequently thrombosed. As such, patient presents now for attempted image guided thrombolysis of  the chronic left forearm dialysis graft. EXAM: 1. FISTULALYSIS 2. ANGIOPLASTY OF VENOUS LIMB AND VENOUS ANASTOMOSIS 3. ULTRASOUND GUIDANCE FOR VENOUS ACCESS COMPARISON:  Left forearm graft declot-12/22/2012. MEDICATIONS: Heparin 3000 units IV; TPA 4 mg into graft. CONTRAST:  50 cc Isovue-300 ANESTHESIA/SEDATION: Moderate (conscious) sedation was employed during this procedure. A total of Versed 1.5 mg and Fentanyl 75 mcg was administered intravenously. Moderate Sedation Time: 56 minutes. The patient's level of consciousness and vital signs were monitored continuously by radiology nursing throughout the procedure under my direct supervision. FLUOROSCOPY TIME:  10 minutes, 24 seconds (11 mGy) COMPLICATIONS: None immediate. TECHNIQUE: Informed written consent was obtained from the patient after a discussion of the risks, benefits and alternatives to treatment. Questions regarding the procedure were encouraged and answered. A timeout was performed prior to the initiation of the procedure. On physical examination, the existing left forearm arm dialysis graft was negative for palpable pulse or thrill. The skin overlying the graft was prepped and draped in the usual sterile fashion, and a sterile drape was applied covering the operative field. Maximum barrier sterile technique with sterile gowns and gloves were used for the procedure. Under ultrasound guidance, the dialysis graft was accessed directed towards the venous anastomosis with a micropuncture kit after the overlying soft tissues were anesthetized with 1% lidocaine. An ultrasound image was saved for documentation purposes. The micropuncture sheath was exchange for a 7-French vascular sheath over a guidewire. Over a Benson wire, a Kumpe catheter was advanced centrally and a central venogram was performed. Pull back venogram was performed with the Kumpe catheter. Heparin was administered systemically and TPA was administered via the Kumpe catheter throughout near  the entirety of the venous limb. The venous anastomosis and majority of the venous limb was angioplastied with a 6 mm x 4 cm Conquest balloon. An additional access was obtained directed towards the arterial anastomoses with a micropuncture kit after the overlying soft tissues anesthetized with 1% lidocaine. This allowed for placement of a 6-French vascular sheath. The graft was thrombectomized with several rounds of push-pull mechanical thrombectomy with an occlusion balloon. Flow was restored to the graft as evidenced by restored pulsatility of the graft and brisk blood return from the side arm of the vascular sheath. Shuntograms were performed. Images were reviewed and the procedure was terminated. At this point, the procedure was terminated. All wires, catheters and sheaths were removed from the patient. Hemostasis was achieved at both access sites with deployment of a swizzle sutures which will be removed at the patient's next dialysis session. Dressings were placed. The patient tolerated the procedure without immediate postprocedural complication. FINDINGS: The existing left forearm AV graft is thrombosed. The venous anastomosis and near the entirety of the venous limb was angioplastied to  6 mm diameter. The graft was successfully thrombectomized using mechanical and pharmacologic means as above. Completion shuntogram demonstrates the venous limb and anastomosis are widely patent. Small area of extravasation was noted at recent dialysis graft cannulation site however this was successfully treated with manual compression following the completion of procedure. The central venous system, arterial limb and arterial anastomosis are widely patent. IMPRESSION: 1. Technically successful left forearm AV graft thrombolysis. 2. Successful angioplasty of the venous anastomosis and majority of the venous limb to 6 mm diameter. Completion shuntogram demonstrates the venous limb and anastomosis are widely patent. 3. The  arterial anastomosis and arterial limb are widely patent. 4. The central venous system is widely patent. ACCESS: This graft would be amenable to future percutaneous intervention as clinically indicated. Electronically Signed   By: Sandi Mariscal M.D.   On: 12/28/2018 16:46   Medications:  . chlorhexidine  15 mL Mouth Rinse BID  . insulin aspart  0-5 Units Subcutaneous QHS  . insulin aspart  0-9 Units Subcutaneous TID WC  . insulin glargine  6 Units Subcutaneous Daily  . mouth rinse  15 mL Mouth Rinse q12n4p  . mycophenolate  720 mg Oral BID  . tacrolimus  6 mg Oral Daily   And  . tacrolimus  5 mg Oral QHS     Assessment/Plan: The patient is a 48 y.o. year old male patient with a past medical history significant for end-stage renal disease status post renal transplantation, type 2 diabetes, chronic anemia, hyperlipidemia, and hypertension who presented to the emergency department with increasing shortness of breath for approximately 3 weeks with associated nausea and vomiting.  1.  End-stage renal disease status post renal transplantation.  His most recent office visit to [facility] transplant was in 19-Nov-2017.  He received a deceased donor renal transplant on 12/26/2013.  100% PRA, KDPII greater than 85.  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  His baseline creatinine as documented back in 2019 was about 2-3.  He is on maintenance immunosuppression with tacrolimus 6 in the morning and 5 at night, and Myfortic 4 tabs twice daily.  He was previously on prednisone, but he reports that was discontinued.  Tac 5/11 not detected > 5/13 3.0 > 5/14 PM pending.  Maintain current dose for now.    2.  Acute kidney injury.  Unclear if this is acute GI issue leading to hypovolemia and AKI versus progression of underlying disease and uremia.  Cr 13 on admission, has rec'd HD x 2 (5/12 2 hrs,  5/15 3hrs).   Likely renal biopsy early next week if lack of improvement - will be able to make that determination between 5/16-5/17 labs.  Strict I/Os ordered but have not been documented, reiterated with him again today.   3.  Diarrhea:  c diff and viral pathogen panel negative.  CMV VL still pending. Planned to involve GI for potential colonoscopy given immunosuppressed status if persists, but seems to have resolved so hold on consult for now.  4.  Diabetic ketoacidosis.  Resolved.  A1c is 7.2.   5.  Secondary hyperparathyroidism.  PTH is within goal for CKD stage.  Phosphorus is 5.4.  We will continue to monitor.  6.  Anemia of chronic kidney disease. Iron replete. Hb 12.2 on presentation, down into 6s.  Rec'd 1 u pRBC 5/12 with appropriate increase.  Hb stable in  the 7s.  Of note he lost ~282m blood when access clotted.  7.  Thrombocytopenia:  Chronic issue.  Plt have trended down during admission but in fact are now at his baseline.  I think a lot of the change this admission was hemoconcentration on presentation.    8.  Hypokalemia: Resolved, likely related to diarrhea. CTM.  LJannifer HickMD 12/30/2018, 8:33 AM  [facility] Pager: [phone]

## 2018-12-30 NOTE — Plan of Care (Signed)

## 2018-12-30 NOTE — Progress Notes (Signed)
PROGRESS NOTE        PATIENT DETAILS Name: [name] Age: 48 y.o. Sex: male Date of Birth: Dec 14, 1970 Admit Date: 12/24/2018 Admitting Physician Rise Patience, MD MAU:[id number], [name], NP  Brief Narrative: Patient is a 48 y.o. male with history of renal transplant, DM-2, HTN, dyslipidemia-who presented with DKA, AKI on CKD stage III.  See below for further details.  Subjective: Diarrhea seems to have resolved.  No chest pain or shortness of breath.  Assessment/Plan: DKA: Resolved-on admission required insulin infusion-has been transitioned to subcutaneous insulin.  Severe metabolic acidosis: Felt to be a combination of DKA, AKI.  Improved after resolution of DKA and HD.  AKI on CKD stage IV (history of renal transplant on immunosuppressive's): Worsening creatinine either secondary to diarrhea/hypovolemia or from progression of underlying CKD.  Has undergone HD x2 during this hospital stay-nephrology following-plans are to follow electrolytes, urine output-apparently may require renal biopsy next week if no improvement.  Diarrhea: Seems to have resolved-GI pathogen panel and C. difficile PCR negative. CMV PCR pending.  History of renal transplantation: Remains on maintenance immunosuppression with tacrolimus and Myfortic.  Tacrolimus level still pending  Hypokalemia: Resolved-likely secondary to diarrhea.  Thrombocytopenia: Appears to be a chronic issue-has had thrombocytopenia going back to 2019.  No evidence of bleeding-follow for now.  Platelet count remains to be relatively stable over the past few days.  Anemia: Likely secondary to chronic kidney disease-no evidence of blood loss-hemoglobin has remained stable over the past few days, continue to follow CBC-and transfuse if less than 7.    Insulin-dependent DM-2: CBGs stable-continue Lantus 6 units and SSI.  Prolonged QTc interval: Resolved-probably secondary to hypokalemia  DVT  Prophylaxis: SCDs-stop heparin given worsening thrombocytopenia  Code Status: Full code  Family Communication: None at bedside  Disposition Plan: Remain inpatient  Antimicrobial agents: Anti-infectives (From admission, onward)   None      Procedures: None  CONSULTS:  nephrology  Time spent: 25 minutes-Greater than 50% of this time was spent in counseling, explanation of diagnosis, planning of further management, and coordination of care.  MEDICATIONS: Scheduled Meds:  chlorhexidine  15 mL Mouth Rinse BID   insulin aspart  0-5 Units Subcutaneous QHS   insulin aspart  0-9 Units Subcutaneous TID WC   insulin glargine  6 Units Subcutaneous Daily   mouth rinse  15 mL Mouth Rinse q12n4p   mycophenolate  720 mg Oral BID   tacrolimus  6 mg Oral Daily   And   tacrolimus  5 mg Oral QHS   Continuous Infusions:  PRN Meds:.acetaminophen, iohexol, loperamide, phenol, promethazine   PHYSICAL EXAM: Vital signs: Vitals:   12/29/18 1612 12/29/18 2138 12/30/18 0604 12/30/18 0605  BP: 122/73 112/61 128/77   Pulse: 95 86 90   Resp:      Temp: 98.3 F (36.8 C) 98.6 F (37 C) 98.9 F (37.2 C)   TempSrc: Oral Oral Oral   SpO2: 99% 100% 99%   Weight: 56.2 kg   58.8 kg  Height:       Filed Weights   12/29/18 1217 12/29/18 1612 12/30/18 0605  Weight: 56.9 kg 56.2 kg 58.8 kg   Body mass index is 21.57 kg/m.   General appearance:Awake, alert, not in any distress.  Eyes:no scleral icterus. HEENT: Atraumatic and Normocephalic Neck: supple, no JVD. Resp:Good air entry bilaterally,no rales  or rhonchi CVS: S1 S2 regular GI: Bowel sounds present, Non tender and not distended with no gaurding, rigidity or rebound. Neurology:  Non focal-but with generalized weakness Psychiatric: Normal judgment and insight. Normal mood. Musculoskeletal:No digital cyanosis Skin:No Rash, warm and dry Wounds:N/A  I have personally reviewed following labs and imaging  studies  LABORATORY DATA: CBC: Recent Labs  Lab 12/27/18 0252 12/28/18 0329 12/29/18 0342 12/29/18 0944 12/30/18 0256  WBC 8.9 9.4 7.9 9.3 7.2  HGB 8.2* 7.5* 7.4* 7.6* 7.2*  HCT 23.6* 22.7* 22.3* 23.4* 22.5*  MCV 76.9* 78.0* 79.6* 80.4 80.4  PLT 78* 63* 65* 70* 74*    Basic Metabolic Panel: Recent Labs  Lab 12/25/18 0110  12/25/18 1340  12/26/18 0754 12/27/18 0252 12/27/18 0732 12/28/18 0329 12/28/18 1750 12/29/18 0342 12/30/18 0256  NA 137   < > 143   < > 142 141  --  137 138 135 137  K 4.1   < > 4.5   < > 2.1* 2.0*  --  2.3* 3.4* 3.5 3.5  CL 117*   < > 127*   < > 113* 105  --  100 104 104 102  CO2 <7*   < > <7*   < > 17* 24  --  25 22 20* 25  GLUCOSE 244*   < > 189*   < > 99 131*  --  92 84 92 131*  BUN 122*   < > 120*   < > 68* 67*  --  56* 50* 50* 21*  CREATININE 11.90*   < > 10.75*   < > 7.19* 7.31*  --  6.63* 6.53* 6.28* 3.53*  CALCIUM 7.9*   < > 7.3*   < > 7.2* 6.6*  --  6.3* 6.9* 6.8* 7.2*  MG 1.8  --  1.5*  --   --   --  1.5*  --   --  1.7 2.2  PHOS  --   --   --   --  2.7 2.9  --  2.1*  --  2.6 2.1*   < > = values in this interval not displayed.    GFR: Estimated Creatinine Clearance: 20.8 mL/min (A) (by C-G formula based on SCr of 3.53 mg/dL (H)).  Liver Function Tests: Recent Labs  Lab 12/24/18 1804 12/26/18 0754 12/27/18 0252 12/28/18 0329 12/29/18 0342 12/30/18 0256  AST 9*  --   --   --   --   --   ALT 12  --   --   --   --   --   ALKPHOS 113  --   --   --   --   --   BILITOT 0.3  --   --   --   --   --   PROT 7.3  --   --   --   --   --   ALBUMIN 4.5 3.0* 2.9* 2.6* 2.5* 2.6*   Recent Labs  Lab 12/24/18 1804  LIPASE 768*   No results for input(s): AMMONIA in the last 168 hours.  Coagulation Profile: Recent Labs  Lab 12/26/18 0754  INR 1.2    Cardiac Enzymes: Recent Labs  Lab 12/24/18 1804 12/25/18 0110 12/25/18 0455  CKTOTAL  --  58  --   TROPONINI <0.03 <0.03 <0.03    BNP (last 3 results) No results for input(s):  PROBNP in the last 8760 hours.  HbA1C: No results for input(s): HGBA1C in the last 72 hours.  CBG: Recent Labs  Lab 12/29/18 1202 12/29/18 1736 12/29/18 2140 12/30/18 0748 12/30/18 1209  GLUCAP 126* 112* 125* 88 149*    Lipid Profile: No results for input(s): CHOL, HDL, LDLCALC, TRIG, CHOLHDL, LDLDIRECT in the last 72 hours.  Thyroid Function Tests: No results for input(s): TSH, T4TOTAL, FREET4, T3FREE, THYROIDAB in the last 72 hours.  Anemia Panel: No results for input(s): VITAMINB12, FOLATE, FERRITIN, TIBC, IRON, RETICCTPCT in the last 72 hours.  Urine analysis:    Component Value Date/Time   COLORURINE YELLOW 12/25/2018 0200   APPEARANCEUR CLOUDY (A) 12/25/2018 0200   LABSPEC 1.012 12/25/2018 0200   PHURINE 5.0 12/25/2018 0200   GLUCOSEU NEGATIVE 12/25/2018 0200   HGBUR MODERATE (A) 12/25/2018 0200   BILIRUBINUR NEGATIVE 12/25/2018 0200   KETONESUR NEGATIVE 12/25/2018 0200   PROTEINUR 30 (A) 12/25/2018 0200   UROBILINOGEN 0.2 12/23/2008 1845   NITRITE NEGATIVE 12/25/2018 0200   LEUKOCYTESUR NEGATIVE 12/25/2018 0200    Sepsis Labs: Lactic Acid, Venous    Component Value Date/Time   LATICACIDVEN 2.2 ([place]) 12/24/2018 1804    MICROBIOLOGY: Recent Results (from the past 240 hour(s))  Culture, blood (single)     Status: None   Collection Time: 12/24/18  6:04 PM  Result Value Ref Range Status   Specimen Description BLOOD SITE NOT SPECIFIED  Final   Special Requests   Final    BOTTLES DRAWN AEROBIC AND ANAEROBIC Blood Culture adequate volume   Culture   Final    NO GROWTH 5 DAYS Performed at  Hospital Lab, [place] [address] [place], [place] [address]    Report Status 12/29/2018 FINAL  Final  SARS Coronavirus 2 (CEPHEID - Performed in [place] hospital lab), Hosp Order     Status: None   Collection Time: 12/24/18  6:19 PM  Result Value Ref Range Status   SARS Coronavirus 2 NEGATIVE NEGATIVE Final    Comment: (NOTE) If result is NEGATIVE SARS-CoV-2  target nucleic acids are NOT DETECTED. The SARS-CoV-2 RNA is generally detectable in upper and lower  respiratory specimens during the acute phase of infection. The lowest  concentration of SARS-CoV-2 viral copies this assay can detect is 250  copies / mL. A negative result does not preclude SARS-CoV-2 infection  and should not be used as the sole basis for treatment or other  patient management decisions.  A negative result may occur with  improper specimen collection / handling, submission of specimen other  than nasopharyngeal swab, presence of viral mutation(s) within the  areas targeted by this assay, and inadequate number of viral copies  (<250 copies / mL). A negative result must be combined with clinical  observations, patient history, and epidemiological information. If result is POSITIVE SARS-CoV-2 target nucleic acids are DETECTED. The SARS-CoV-2 RNA is generally detectable in upper and lower  respiratory specimens dur ing the acute phase of infection.  Positive  results are indicative of active infection with SARS-CoV-2.  Clinical  correlation with patient history and other diagnostic information is  necessary to determine patient infection status.  Positive results do  not rule out bacterial infection or co-infection with other viruses. If result is PRESUMPTIVE POSTIVE SARS-CoV-2 nucleic acids MAY BE PRESENT.   A presumptive positive result was obtained on the submitted specimen  and confirmed on repeat testing.  While 2019 novel coronavirus  (SARS-CoV-2) nucleic acids may be present in the submitted sample  additional confirmatory testing may be necessary for epidemiological  and / or clinical management purposes  to differentiate between  SARS-CoV-2 and other Sarbecovirus currently known to infect humans.  If clinically indicated additional testing with an alternate test  methodology [phone]) is advised. The SARS-CoV-2 RNA is generally  detectable in upper and lower  respiratory sp ecimens during the acute  phase of infection. The expected result is Negative. Fact Sheet for Patients:  StrictlyIdeas.no Fact Sheet for Healthcare Providers: BankingDealers.co.za This test is not yet approved or cleared by the [place] FDA and has been authorized for detection and/or diagnosis of SARS-CoV-2 by FDA under an Emergency Use Authorization (EUA).  This EUA will remain in effect (meaning this test can be used) for the duration of the COVID-19 declaration under Section 564(b)(1) of the Act, 21 U.S.C. section 360bbb-3(b)(1), unless the authorization is terminated or revoked sooner. Performed at [facility], [place] [address] [place], [place] [address]   Stool culture (children & immunocomp patients)     Status: None (Preliminary result)   Collection Time: 12/25/18  7:20 PM  Result Value Ref Range Status   Salmonella/Shigella Screen Final report  Final   Campylobacter Culture PENDING  Incomplete   E coli, Shiga toxin Assay Negative Negative Final    Comment: (NOTE) Performed At: [facility] [place], [place] [id number] Rush Farmer MD DH:[id number]   C difficile quick scan w PCR reflex     Status: None   Collection Time: 12/25/18  7:20 PM  Result Value Ref Range Status   C Diff antigen NEGATIVE NEGATIVE Final   C Diff toxin NEGATIVE NEGATIVE Final   C Diff interpretation No C. difficile detected.  Final    Comment: Performed at [facility], [place] [address], [place], [place] [address]  STOOL CULTURE REFLEX - RSASHR     Status: None   Collection Time: 12/25/18  7:20 PM  Result Value Ref Range Status   Stool Culture result 1 (RSASHR) Comment  Final    Comment: (NOTE) No Salmonella or Shigella recovered. Performed At: [facility] [place], [place] [id number] Rush Farmer MD IB:[id number]   Gastrointestinal Panel by PCR , Stool     Status: None    Collection Time: 12/28/18 10:06 AM  Result Value Ref Range Status   Campylobacter species NOT DETECTED NOT DETECTED Final   Plesimonas shigelloides NOT DETECTED NOT DETECTED Final   Salmonella species NOT DETECTED NOT DETECTED Final   Yersinia enterocolitica NOT DETECTED NOT DETECTED Final   Vibrio species NOT DETECTED NOT DETECTED Final   Vibrio cholerae NOT DETECTED NOT DETECTED Final   Enteroaggregative E coli (EAEC) NOT DETECTED NOT DETECTED Final   Enteropathogenic E coli (EPEC) NOT DETECTED NOT DETECTED Final   Enterotoxigenic E coli (ETEC) NOT DETECTED NOT DETECTED Final   Shiga like toxin producing E coli (STEC) NOT DETECTED NOT DETECTED Final   Shigella/Enteroinvasive E coli (EIEC) NOT DETECTED NOT DETECTED Final   Cryptosporidium NOT DETECTED NOT DETECTED Final   Cyclospora cayetanensis NOT DETECTED NOT DETECTED Final   Entamoeba histolytica NOT DETECTED NOT DETECTED Final   Giardia lamblia NOT DETECTED NOT DETECTED Final   Adenovirus F40/41 NOT DETECTED NOT DETECTED Final   Astrovirus NOT DETECTED NOT DETECTED Final   Norovirus GI/GII NOT DETECTED NOT DETECTED Final   Rotavirus A NOT DETECTED NOT DETECTED Final   Sapovirus (I, II, IV, and V) NOT DETECTED NOT DETECTED Final    Comment: Performed at [facility], [address] [place],  [address]  RADIOLOGY STUDIES/RESULTS: Ir US Guide Vasc Access Left  Result Date: 12/28/2018 INDICATION: History of end-stage renal disease with recently failed renal transplant. Patient previously received dialysis via a left forearm graft which was most recently intervened on 12/22/2012. The left forearm dialysis graft has not been in use for the past 6 years however they were able to provide one session of dialysis via the graft earlier this week however unfortunately the graft has subsequently thrombosed. As such, patient presents now for attempted image guided thrombolysis of the chronic left forearm dialysis graft.  EXAM: 1. FISTULALYSIS 2. ANGIOPLASTY OF VENOUS LIMB AND VENOUS ANASTOMOSIS 3. ULTRASOUND GUIDANCE FOR VENOUS ACCESS COMPARISON:  Left forearm graft declot-12/22/2012. MEDICATIONS: Heparin 3000 units IV; TPA 4 mg into graft. CONTRAST:  50 cc Isovue-300 ANESTHESIA/SEDATION: Moderate (conscious) sedation was employed during this procedure. A total of Versed 1.5 mg and Fentanyl 75 mcg was administered intravenously. Moderate Sedation Time: 56 minutes. The patient's level of consciousness and vital signs were monitored continuously by radiology nursing throughout the procedure under my direct supervision. FLUOROSCOPY TIME:  10 minutes, 24 seconds (11 mGy) COMPLICATIONS: None immediate. TECHNIQUE: Informed written consent was obtained from the patient after a discussion of the risks, benefits and alternatives to treatment. Questions regarding the procedure were encouraged and answered. A timeout was performed prior to the initiation of the procedure. On physical examination, the existing left forearm arm dialysis graft was negative for palpable pulse or thrill. The skin overlying the graft was prepped and draped in the usual sterile fashion, and a sterile drape was applied covering the operative field. Maximum barrier sterile technique with sterile gowns and gloves were used for the procedure. Under ultrasound guidance, the dialysis graft was accessed directed towards the venous anastomosis with a micropuncture kit after the overlying soft tissues were anesthetized with 1% lidocaine. An ultrasound image was saved for documentation purposes. The micropuncture sheath was exchange for a 7-French vascular sheath over a guidewire. Over a Benson wire, a Kumpe catheter was advanced centrally and a central venogram was performed. Pull back venogram was performed with the Kumpe catheter. Heparin was administered systemically and TPA was administered via the Kumpe catheter throughout near the entirety of the venous limb. The venous  anastomosis and majority of the venous limb was angioplastied with a 6 mm x 4 cm Conquest balloon. An additional access was obtained directed towards the arterial anastomoses with a micropuncture kit after the overlying soft tissues anesthetized with 1% lidocaine. This allowed for placement of a 6-French vascular sheath. The graft was thrombectomized with several rounds of push-pull mechanical thrombectomy with an occlusion balloon. Flow was restored to the graft as evidenced by restored pulsatility of the graft and brisk blood return from the side arm of the vascular sheath. Shuntograms were performed. Images were reviewed and the procedure was terminated. At this point, the procedure was terminated. All wires, catheters and sheaths were removed from the patient. Hemostasis was achieved at both access sites with deployment of a swizzle sutures which will be removed at the patient's next dialysis session. Dressings were placed. The patient tolerated the procedure without immediate postprocedural complication. FINDINGS: The existing left forearm AV graft is thrombosed. The venous anastomosis and near the entirety of the venous limb was angioplastied to 6 mm diameter. The graft was successfully thrombectomized using mechanical and pharmacologic means as above. Completion shuntogram demonstrates the venous limb and anastomosis are widely patent. Small area of extravasation was noted at recent dialysis graft cannulation site however this  was successfully treated with manual compression following the completion of procedure. The central venous system, arterial limb and arterial anastomosis are widely patent. IMPRESSION: 1. Technically successful left forearm AV graft thrombolysis. 2. Successful angioplasty of the venous anastomosis and majority of the venous limb to 6 mm diameter. Completion shuntogram demonstrates the venous limb and anastomosis are widely patent. 3. The arterial anastomosis and arterial limb are widely  patent. 4. The central venous system is widely patent. ACCESS: This graft would be amenable to future percutaneous intervention as clinically indicated. Electronically Signed   By: Sandi Mariscal M.D.   On: 12/28/2018 16:46   Dg Chest Port 1 View  Result Date: 12/24/2018 CLINICAL DATA:  Tachypnea EXAM: PORTABLE CHEST 1 VIEW COMPARISON:  01/18/2018 FINDINGS: The heart size and mediastinal contours are within normal limits. Both lungs are clear. The visualized skeletal structures are unremarkable. IMPRESSION: No acute abnormality of the lungs in AP portable projection. Electronically Signed   By: Eddie Candle M.D.   On: 12/24/2018 18:41   Ir Thrombectomy Av Fistula W/thrombolysis/pta Inc/shunt/img Left  Result Date: 12/28/2018 INDICATION: History of end-stage renal disease with recently failed renal transplant. Patient previously received dialysis via a left forearm graft which was most recently intervened on 12/22/2012. The left forearm dialysis graft has not been in use for the past 6 years however they were able to provide one session of dialysis via the graft earlier this week however unfortunately the graft has subsequently thrombosed. As such, patient presents now for attempted image guided thrombolysis of the chronic left forearm dialysis graft. EXAM: 1. FISTULALYSIS 2. ANGIOPLASTY OF VENOUS LIMB AND VENOUS ANASTOMOSIS 3. ULTRASOUND GUIDANCE FOR VENOUS ACCESS COMPARISON:  Left forearm graft declot-12/22/2012. MEDICATIONS: Heparin 3000 units IV; TPA 4 mg into graft. CONTRAST:  50 cc Isovue-300 ANESTHESIA/SEDATION: Moderate (conscious) sedation was employed during this procedure. A total of Versed 1.5 mg and Fentanyl 75 mcg was administered intravenously. Moderate Sedation Time: 56 minutes. The patient's level of consciousness and vital signs were monitored continuously by radiology nursing throughout the procedure under my direct supervision. FLUOROSCOPY TIME:  10 minutes, 24 seconds (11 mGy) COMPLICATIONS:  None immediate. TECHNIQUE: Informed written consent was obtained from the patient after a discussion of the risks, benefits and alternatives to treatment. Questions regarding the procedure were encouraged and answered. A timeout was performed prior to the initiation of the procedure. On physical examination, the existing left forearm arm dialysis graft was negative for palpable pulse or thrill. The skin overlying the graft was prepped and draped in the usual sterile fashion, and a sterile drape was applied covering the operative field. Maximum barrier sterile technique with sterile gowns and gloves were used for the procedure. Under ultrasound guidance, the dialysis graft was accessed directed towards the venous anastomosis with a micropuncture kit after the overlying soft tissues were anesthetized with 1% lidocaine. An ultrasound image was saved for documentation purposes. The micropuncture sheath was exchange for a 7-French vascular sheath over a guidewire. Over a Benson wire, a Kumpe catheter was advanced centrally and a central venogram was performed. Pull back venogram was performed with the Kumpe catheter. Heparin was administered systemically and TPA was administered via the Kumpe catheter throughout near the entirety of the venous limb. The venous anastomosis and majority of the venous limb was angioplastied with a 6 mm x 4 cm Conquest balloon. An additional access was obtained directed towards the arterial anastomoses with a micropuncture kit after the overlying soft tissues anesthetized with 1% lidocaine. This  allowed for placement of a 6-French vascular sheath. The graft was thrombectomized with several rounds of push-pull mechanical thrombectomy with an occlusion balloon. Flow was restored to the graft as evidenced by restored pulsatility of the graft and brisk blood return from the side arm of the vascular sheath. Shuntograms were performed. Images were reviewed and the procedure was terminated. At this  point, the procedure was terminated. All wires, catheters and sheaths were removed from the patient. Hemostasis was achieved at both access sites with deployment of a swizzle sutures which will be removed at the patient's next dialysis session. Dressings were placed. The patient tolerated the procedure without immediate postprocedural complication. FINDINGS: The existing left forearm AV graft is thrombosed. The venous anastomosis and near the entirety of the venous limb was angioplastied to 6 mm diameter. The graft was successfully thrombectomized using mechanical and pharmacologic means as above. Completion shuntogram demonstrates the venous limb and anastomosis are widely patent. Small area of extravasation was noted at recent dialysis graft cannulation site however this was successfully treated with manual compression following the completion of procedure. The central venous system, arterial limb and arterial anastomosis are widely patent. IMPRESSION: 1. Technically successful left forearm AV graft thrombolysis. 2. Successful angioplasty of the venous anastomosis and majority of the venous limb to 6 mm diameter. Completion shuntogram demonstrates the venous limb and anastomosis are widely patent. 3. The arterial anastomosis and arterial limb are widely patent. 4. The central venous system is widely patent. ACCESS: This graft would be amenable to future percutaneous intervention as clinically indicated. Electronically Signed   By: Sandi Mariscal M.D.   On: 12/28/2018 16:46   Ct Renal Stone Study  Result Date: 12/25/2018 CLINICAL DATA:  Flank pain, history of renal transplant EXAM: CT ABDOMEN AND PELVIS WITHOUT CONTRAST TECHNIQUE: Multidetector CT imaging of the abdomen and pelvis was performed following the standard protocol without IV contrast. COMPARISON:  None. FINDINGS: Examination of the abdomen pelvis is generally limited by motion artifact throughout. Lower chest: No acute abnormality. Hepatobiliary: No  focal liver abnormality is seen. The gallbladder is mildly distended and may contain dependent sludge; evaluation is particularly limited by breath motion artifact through this level. Pancreas: Unremarkable. No pancreatic ductal dilatation or surrounding inflammatory changes. Spleen: Normal in size without focal abnormality. Adrenals/Urinary Tract: Adrenal glands are unremarkable. 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 No hydronephrosis or ureteral calculus. 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 Bladder is unremarkable. Stomach/Bowel: Stomach is within normal limits. Appendix appears normal. The colon is fluid-filled to the rectum. Vascular/Lymphatic: No significant vascular findings are present. No enlarged abdominal or pelvic lymph nodes. Reproductive: No mass or other abnormality. Other: No abdominal wall hernia or abnormality. No abdominopelvic ascites. Musculoskeletal: No acute or significant osseous findings. IMPRESSION: 1. Examination of the abdomen and pelvis is generally limited by motion artifact throughout. Within this limitation, no definite non-contrast CT findings to explain lower abdominal pain. 2. The gallbladder is mildly distended and may contain dependent sludge; evaluation is particularly limited by breath motion artifact through this level. 3. 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 No hydronephrosis or ureteral calculus. 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 4. The colon is fluid-filled to the rectum, in keeping with diarrheal illness. Electronically Signed   By: Eddie Candle M.D.   On:  12/25/2018 08:43     LOS: 6 days   Oren Binet, MD  [facility]  If  7PM-7AM, please contact night-coverage  Please page via [email or web address]  Go to [email or web address] and use [place]'s universal password to access. If you do not have the password, please contact the hospital operator.  Locate the [facility] provider you are looking for under [facility] and page to a number that you can be directly reached. If you still have difficulty reaching the provider, please page the [facility] (Director on Call) for the Hospitalists listed on amion for assistance.  12/30/2018, 1:17 PM

## 2018-12-30 NOTE — Progress Notes (Signed)
PT Cancellation Note  Patient Details Name: [name] MRN: [id number] DOB: 02-27-1971   Cancelled Treatment:    Reason Eval/Treat Not Completed: Patient declined, no reason specified Patient adamantly refusing OOB mobility at this time. States he does not wish to get out of bed until Monday. Encouraged and educated on importance of mobility with patient continuing to decline. Will continue to follow.    Lanney Gins, PT, DPT Supplemental Physical Therapist 12/30/18 2:11 PM Pager: [phone] Office: [phone]

## 2018-12-31 LAB — RENAL FUNCTION PANEL
Albumin: 2.6 g/dL — ABNORMAL LOW (ref 3.5–5.0)
Albumin: 2.7 g/dL — ABNORMAL LOW (ref 3.5–5.0)
Anion gap: 10 (ref 5–15)
Anion gap: 10 (ref 5–15)
BUN: 21 mg/dL — ABNORMAL HIGH (ref 6–20)
BUN: 21 mg/dL — ABNORMAL HIGH (ref 6–20)
CO2: 25 mmol/L (ref 22–32)
CO2: 25 mmol/L (ref 22–32)
Calcium: 7.2 mg/dL — ABNORMAL LOW (ref 8.9–10.3)
Calcium: 7.5 mg/dL — ABNORMAL LOW (ref 8.9–10.3)
Chloride: 102 mmol/L (ref 98–111)
Chloride: 102 mmol/L (ref 98–111)
Creatinine, Ser: 3.53 mg/dL — ABNORMAL HIGH (ref 0.61–1.24)
Creatinine, Ser: 4.04 mg/dL — ABNORMAL HIGH (ref 0.61–1.24)
GFR calc Af Amer: 22 mL/min — ABNORMAL LOW (ref >60)
GFR calc Af Amer: 19 mL/min — ABNORMAL LOW (ref >60)
GFR calc non Af Amer: 19 mL/min — ABNORMAL LOW (ref >60)
GFR calc non Af Amer: 16 mL/min — ABNORMAL LOW (ref >60)
Glucose, Bld: 131 mg/dL — ABNORMAL HIGH (ref 70–99)
Glucose, Bld: 116 mg/dL — ABNORMAL HIGH (ref 70–99)
Phosphorus: 2.1 mg/dL — ABNORMAL LOW (ref 2.5–4.6)
Phosphorus: 2.6 mg/dL (ref 2.5–4.6)
Potassium: 3.5 mmol/L (ref 3.5–5.1)
Potassium: 3.4 mmol/L — ABNORMAL LOW (ref 3.5–5.1)
Sodium: 137 mmol/L (ref 135–145)
Sodium: 137 mmol/L (ref 135–145)

## 2018-12-31 LAB — CBC
HCT: 23.3 % — ABNORMAL LOW (ref 39.0–52.0)
Hemoglobin: 7.4 g/dL — ABNORMAL LOW (ref 13.0–17.0)
MCH: 26.1 pg (ref 26.0–34.0)
MCHC: 31.8 g/dL (ref 30.0–36.0)
MCV: 82 fL (ref 80.0–100.0)
Platelets: 82 10*3/uL — ABNORMAL LOW (ref 150–400)
RBC: 2.84 MIL/uL — ABNORMAL LOW (ref 4.22–5.81)
RDW: 15.9 % — ABNORMAL HIGH (ref 11.5–15.5)
WBC: 7.3 10*3/uL (ref 4.0–10.5)
nRBC: 0 % (ref 0.0–0.2)

## 2018-12-31 LAB — APTT: aPTT: 40 seconds — ABNORMAL HIGH (ref 24–36)

## 2018-12-31 LAB — PROTIME-INR
INR: 1.1 (ref 0.8–1.2)
Prothrombin Time: 14 seconds (ref 11.4–15.2)

## 2018-12-31 LAB — GLUCOSE, CAPILLARY
Glucose-Capillary: 112 mg/dL — ABNORMAL HIGH (ref 70–99)
Glucose-Capillary: 138 mg/dL — ABNORMAL HIGH (ref 70–99)
Glucose-Capillary: 88 mg/dL (ref 70–99)
Glucose-Capillary: 127 mg/dL — ABNORMAL HIGH (ref 70–99)

## 2018-12-31 MED ORDER — POTASSIUM CHLORIDE CRYS ER 20 MEQ PO TBCR
40.0000 meq | EXTENDED_RELEASE_TABLET | Freq: Once | ORAL | Status: AC
  Administered 2018-12-31: 40 meq via ORAL
  Filled 2018-12-31: qty 2

## 2018-12-31 NOTE — Progress Notes (Signed)
PROGRESS NOTE        PATIENT DETAILS Name: [name] Age: 48 y.o. Sex: male Date of Birth: [birth year] Admit Date: 12/24/2018 Admitting Physician Rise Patience, MD [id number], Alexis American, NP  Brief Narrative: Patient is a 48 y.o. male with history of renal transplant, DM-2, HTN, dyslipidemia-who presented with DKA, AKI on CKD stage III.  See below for further details.  Subjective: No major issues overnight-diarrhea has resolved.  Acknowledges he refused PT yesterday.  Assessment/Plan: DKA: Resolved-on admission required insulin infusion-has been transitioned to subcutaneous insulin.  Severe metabolic acidosis: Felt to be a combination of DKA, AKI.  Improved after resolution of DKA and HD.  AKI on CKD stage IV (history of renal transplant on immunosuppressive's): Worsening creatinine felt to be either secondary to diarrhea/hypovolemia or from progression of underlying CKD. Has undergone HD x2 during this hospital stay, nephrology following-with plans to closely follow labs, urine output-if no improvement-tentative plans are to pursue renal biopsy over the next few days.  Will await further nephrology recommendations today.    Diarrhea: 2 days-GI pathogen panel, C. difficile PCR negative.  CMV PCR pending.   History of renal transplantation: Remains on maintenance immunosuppression with tacrolimus and Myfortic.  Tacrolimus level still pending  Hypokalemia: Better-likely secondary to diarrhea.  Replete and recheck.    Thrombocytopenia: Appears to be a chronic issue-has had thrombocytopenia going back to 2019.  No evidence of bleeding-follow for now.  Platelet count remains to be relatively stable over the past few days.  Anemia: Likely secondary to chronic kidney disease-no evidence of blood loss-hemoglobin has remained stable over the past few days, continue to follow CBC-and transfuse if less than 7.    Insulin-dependent DM-2: CBGs stable-continue  Lantus 6 units and SSI.  Prolonged QTc interval: Resolved-probably secondary to hypokalemia  DVT Prophylaxis: SCDs-stop heparin given worsening thrombocytopenia  Code Status: Full code  Family Communication: None at bedside  Disposition Plan: Remain inpatient  Antimicrobial agents: Anti-infectives (From admission, onward)   None      Procedures: None  CONSULTS:  nephrology  Time spent: 25 minutes-Greater than 50% of this time was spent in counseling, explanation of diagnosis, planning of further management, and coordination of care.  MEDICATIONS: Scheduled Meds:  chlorhexidine  15 mL Mouth Rinse BID   insulin aspart  0-5 Units Subcutaneous QHS   insulin aspart  0-9 Units Subcutaneous TID WC   insulin glargine  6 Units Subcutaneous Daily   mouth rinse  15 mL Mouth Rinse q12n4p   mycophenolate  720 mg Oral BID   tacrolimus  6 mg Oral Daily   And   tacrolimus  5 mg Oral QHS   Continuous Infusions:  PRN Meds:.acetaminophen, iohexol, loperamide, phenol, promethazine   PHYSICAL EXAM: Vital signs: Vitals:   12/30/18 0605 12/30/18 2121 12/31/18 0515 12/31/18 0602  BP:  118/74 120/73   Pulse:  81 88   Resp:  18 18   Temp:  98.4 F (36.9 C) 98.8 F (37.1 C)   TempSrc:  Oral Oral   SpO2:  100% 99%   Weight: 58.8 kg   58.2 kg  Height:       Filed Weights   12/29/18 1612 12/30/18 0605 12/31/18 0602  Weight: 56.2 kg 58.8 kg 58.2 kg   Body mass index is 21.35 kg/m.   General appearance:Awake, alert, not in any distress.  Eyes:no scleral icterus. HEENT: Atraumatic and Normocephalic Neck: supple, no JVD. Resp:Good air entry bilaterally,no added sounds heard anteriorly CVS: S1 S2 regular GI: Bowel sounds present, Non tender and not distended with no gaurding, rigidity or rebound. Extremities: B/L Lower Ext shows no edema, both legs are warm to touch Neurology:  Non focal Musculoskeletal:No digital cyanosis Skin:No Rash, warm and  dry Wounds:N/A  I have personally reviewed following labs and imaging studies  LABORATORY DATA: CBC: Recent Labs  Lab 12/28/18 0329 12/29/18 0342 12/29/18 0944 12/30/18 0256 12/31/18 0340  WBC 9.4 7.9 9.3 7.2 7.3  HGB 7.5* 7.4* 7.6* 7.2* 7.4*  HCT 22.7* 22.3* 23.4* 22.5* 23.3*  MCV 78.0* 79.6* 80.4 80.4 82.0  PLT 63* 65* 70* 74* 82*    Basic Metabolic Panel: Recent Labs  Lab 12/25/18 0110  12/25/18 1340  12/27/18 0252 12/27/18 0732 12/28/18 0329 12/28/18 1750 12/29/18 0342 12/30/18 0256 12/31/18 0340  NA 137   < > 143   < > 141  --  137 138 135 137 137  K 4.1   < > 4.5   < > 2.0*  --  2.3* 3.4* 3.5 3.5 3.4*  CL 117*   < > 127*   < > 105  --  100 104 104 102 102  CO2 <7*   < > <7*   < > 24  --  25 22 20* 25 25  GLUCOSE 244*   < > 189*   < > 131*  --  92 84 92 131* 116*  BUN 122*   < > 120*   < > 67*  --  56* 50* 50* 21* 21*  CREATININE 11.90*   < > 10.75*   < > 7.31*  --  6.63* 6.53* 6.28* 3.53* 4.04*  CALCIUM 7.9*   < > 7.3*   < > 6.6*  --  6.3* 6.9* 6.8* 7.2* 7.5*  MG 1.8  --  1.5*  --   --  1.5*  --   --  1.7 2.2  --   PHOS  --   --   --    < > 2.9  --  2.1*  --  2.6 2.1* 2.6   < > = values in this interval not displayed.    GFR: Estimated Creatinine Clearance: 18 mL/min (A) (by C-G formula based on SCr of 4.04 mg/dL (H)).  Liver Function Tests: Recent Labs  Lab 12/24/18 1804  12/27/18 0252 12/28/18 0329 12/29/18 0342 12/30/18 0256 12/31/18 0340  AST 9*  --   --   --   --   --   --   ALT 12  --   --   --   --   --   --   ALKPHOS 113  --   --   --   --   --   --   BILITOT 0.3  --   --   --   --   --   --   PROT 7.3  --   --   --   --   --   --   ALBUMIN 4.5   < > 2.9* 2.6* 2.5* 2.6* 2.7*   < > = values in this interval not displayed.   Recent Labs  Lab 12/24/18 1804  LIPASE 768*   No results for input(s): AMMONIA in the last 168 hours.  Coagulation Profile: Recent Labs  Lab 12/26/18 0754 12/31/18 0340  INR 1.2 1.1    Cardiac  Enzymes: Recent Labs  Lab 12/24/18 1804 12/25/18 0110 12/25/18 0455  CKTOTAL  --  58  --   TROPONINI <0.03 <0.03 <0.03    BNP (last 3 results) No results for input(s): PROBNP in the last 8760 hours.  HbA1C: No results for input(s): HGBA1C in the last 72 hours.  CBG: Recent Labs  Lab 12/30/18 0748 12/30/18 1209 12/30/18 1658 12/30/18 2122 12/31/18 0757  GLUCAP 88 149* 107* 120* 112*    Lipid Profile: No results for input(s): CHOL, HDL, LDLCALC, TRIG, CHOLHDL, LDLDIRECT in the last 72 hours.  Thyroid Function Tests: No results for input(s): TSH, T4TOTAL, FREET4, T3FREE, THYROIDAB in the last 72 hours.  Anemia Panel: No results for input(s): VITAMINB12, FOLATE, FERRITIN, TIBC, IRON, RETICCTPCT in the last 72 hours.  Urine analysis:    Component Value Date/Time   COLORURINE YELLOW 12/25/2018 0200   APPEARANCEUR CLOUDY (A) 12/25/2018 0200   LABSPEC 1.012 12/25/2018 0200   PHURINE 5.0 12/25/2018 0200   GLUCOSEU NEGATIVE 12/25/2018 0200   HGBUR MODERATE (A) 12/25/2018 0200   BILIRUBINUR NEGATIVE 12/25/2018 0200   KETONESUR NEGATIVE 12/25/2018 0200   PROTEINUR 30 (A) 12/25/2018 0200   UROBILINOGEN 0.2 12/23/2008 1845   NITRITE NEGATIVE 12/25/2018 0200   LEUKOCYTESUR NEGATIVE 12/25/2018 0200    Sepsis Labs: Lactic Acid, Venous    Component Value Date/Time   LATICACIDVEN 2.2 ([place]) 12/24/2018 1804    MICROBIOLOGY: Recent Results (from the past 240 hour(s))  Culture, blood (single)     Status: None   Collection Time: 12/24/18  6:04 PM  Result Value Ref Range Status   Specimen Description BLOOD SITE NOT SPECIFIED  Final   Special Requests   Final    BOTTLES DRAWN AEROBIC AND ANAEROBIC Blood Culture adequate volume   Culture   Final    NO GROWTH 5 DAYS Performed at [facility], [place] [address], [place], [place] [address]    Report Status 12/29/2018 FINAL  Final  SARS Coronavirus 2 (CEPHEID - Performed in [place] hospital lab), Hosp Order     Status:  None   Collection Time: 12/24/18  6:19 PM  Result Value Ref Range Status   SARS Coronavirus 2 NEGATIVE NEGATIVE Final    Comment: (NOTE) If result is NEGATIVE SARS-CoV-2 target nucleic acids are NOT DETECTED. The SARS-CoV-2 RNA is generally detectable in upper and lower  respiratory specimens during the acute phase of infection. The lowest  concentration of SARS-CoV-2 viral copies this assay can detect is 250  copies / mL. A negative result does not preclude SARS-CoV-2 infection  and should not be used as the sole basis for treatment or other  patient management decisions.  A negative result may occur with  improper specimen collection / handling, submission of specimen other  than nasopharyngeal swab, presence of viral mutation(s) within the  areas targeted by this assay, and inadequate number of viral copies  (<250 copies / mL). A negative result must be combined with clinical  observations, patient history, and epidemiological information. If result is POSITIVE SARS-CoV-2 target nucleic acids are DETECTED. The SARS-CoV-2 RNA is generally detectable in upper and lower  respiratory specimens dur ing the acute phase of infection.  Positive  results are indicative of active infection with SARS-CoV-2.  Clinical  correlation with patient history and other diagnostic information is  necessary to determine patient infection status.  Positive results do  not rule out bacterial infection or co-infection with other viruses. If result is PRESUMPTIVE POSTIVE SARS-CoV-2 nucleic  acids MAY BE PRESENT.   A presumptive positive result was obtained on the submitted specimen  and confirmed on repeat testing.  While 2019 novel coronavirus  (SARS-CoV-2) nucleic acids may be present in the submitted sample  additional confirmatory testing may be necessary for epidemiological  and / or clinical management purposes  to differentiate between  SARS-CoV-2 and other Sarbecovirus currently known to infect  humans.  If clinically indicated additional testing with an alternate test  methodology [phone]) is advised. The SARS-CoV-2 RNA is generally  detectable in upper and lower respiratory sp ecimens during the acute  phase of infection. The expected result is Negative. Fact Sheet for Patients:  StrictlyIdeas.no Fact Sheet for Healthcare Providers: BankingDealers.co.za This test is not yet approved or cleared by the [place] FDA and has been authorized for detection and/or diagnosis of SARS-CoV-2 by FDA under an Emergency Use Authorization (EUA).  This EUA will remain in effect (meaning this test can be used) for the duration of the COVID-19 declaration under Section 564(b)(1) of the Act, 21 U.S.C. section 360bbb-3(b)(1), unless the authorization is terminated or revoked sooner. Performed at [facility], [place] [address], [place], [place] [address]   Stool culture (children & immunocomp patients)     Status: None   Collection Time: 12/25/18  7:20 PM  Result Value Ref Range Status   Salmonella/Shigella Screen Final report  Final   Campylobacter Culture Final report  Final   E coli, Shiga toxin Assay Negative Negative Final    Comment: (NOTE) Performed At: [facility] [place], [place] [id number] Rush Farmer MD CL:[id number]   C difficile quick scan w PCR reflex     Status: None   Collection Time: 12/25/18  7:20 PM  Result Value Ref Range Status   C Diff antigen NEGATIVE NEGATIVE Final   C Diff toxin NEGATIVE NEGATIVE Final   C Diff interpretation No C. difficile detected.  Final    Comment: Performed at [facility], [place] [address], [place], [place] [address]  STOOL CULTURE REFLEX - RSASHR     Status: None   Collection Time: 12/25/18  7:20 PM  Result Value Ref Range Status   Stool Culture result 1 (RSASHR) Comment  Final    Comment: (NOTE) No Salmonella or Shigella recovered. Performed At:  [facility] [address] [place], [place] [id number] Rush Farmer MD KZ:[id number]   STOOL CULTURE Reflex - CMPCXR     Status: None   Collection Time: 12/25/18  7:20 PM  Result Value Ref Range Status   Stool Culture result 1 (CMPCXR) Comment  Final    Comment: (NOTE) No Campylobacter species isolated. Performed At: [facility] [place], [place] [id number] Rush Farmer MD RA:[id number]   Gastrointestinal Panel by PCR , Stool     Status: None   Collection Time: 12/28/18 10:06 AM  Result Value Ref Range Status   Campylobacter species NOT DETECTED NOT DETECTED Final   Plesimonas shigelloides NOT DETECTED NOT DETECTED Final   Salmonella species NOT DETECTED NOT DETECTED Final   Yersinia enterocolitica NOT DETECTED NOT DETECTED Final   Vibrio species NOT DETECTED NOT DETECTED Final   Vibrio cholerae NOT DETECTED NOT DETECTED Final   Enteroaggregative E coli (EAEC) NOT DETECTED NOT DETECTED Final   Enteropathogenic E coli (EPEC) NOT DETECTED NOT DETECTED Final   Enterotoxigenic E coli (ETEC) NOT DETECTED NOT DETECTED Final   Shiga like toxin producing E coli (STEC) NOT DETECTED NOT  DETECTED Final   Shigella/Enteroinvasive E coli (EIEC) NOT DETECTED NOT DETECTED Final   Cryptosporidium NOT DETECTED NOT DETECTED Final   Cyclospora cayetanensis NOT DETECTED NOT DETECTED Final   Entamoeba histolytica NOT DETECTED NOT DETECTED Final   Giardia lamblia NOT DETECTED NOT DETECTED Final   Adenovirus F40/41 NOT DETECTED NOT DETECTED Final   Astrovirus NOT DETECTED NOT DETECTED Final   Norovirus GI/GII NOT DETECTED NOT DETECTED Final   Rotavirus A NOT DETECTED NOT DETECTED Final   Sapovirus (I, II, IV, and V) NOT DETECTED NOT DETECTED Final    Comment: Performed at [facility], [address] [place], [place] [address]    RADIOLOGY STUDIES/RESULTS: Ir US Guide Vasc Access Left  Result Date: 12/28/2018 INDICATION: History of end-stage renal  disease with recently failed renal transplant. Patient previously received dialysis via a left forearm graft which was most recently intervened on 12/22/2012. The left forearm dialysis graft has not been in use for the past 6 years however they were able to provide one session of dialysis via the graft earlier this week however unfortunately the graft has subsequently thrombosed. As such, patient presents now for attempted image guided thrombolysis of the chronic left forearm dialysis graft. EXAM: 1. FISTULALYSIS 2. ANGIOPLASTY OF VENOUS LIMB AND VENOUS ANASTOMOSIS 3. ULTRASOUND GUIDANCE FOR VENOUS ACCESS COMPARISON:  Left forearm graft declot-12/22/2012. MEDICATIONS: Heparin 3000 units IV; TPA 4 mg into graft. CONTRAST:  50 cc Isovue-300 ANESTHESIA/SEDATION: Moderate (conscious) sedation was employed during this procedure. A total of Versed 1.5 mg and Fentanyl 75 mcg was administered intravenously. Moderate Sedation Time: 56 minutes. The patient's level of consciousness and vital signs were monitored continuously by radiology nursing throughout the procedure under my direct supervision. FLUOROSCOPY TIME:  10 minutes, 24 seconds (11 mGy) COMPLICATIONS: None immediate. TECHNIQUE: Informed written consent was obtained from the patient after a discussion of the risks, benefits and alternatives to treatment. Questions regarding the procedure were encouraged and answered. A timeout was performed prior to the initiation of the procedure. On physical examination, the existing left forearm arm dialysis graft was negative for palpable pulse or thrill. The skin overlying the graft was prepped and draped in the usual sterile fashion, and a sterile drape was applied covering the operative field. Maximum barrier sterile technique with sterile gowns and gloves were used for the procedure. Under ultrasound guidance, the dialysis graft was accessed directed towards the venous anastomosis with a micropuncture kit after the overlying  soft tissues were anesthetized with 1% lidocaine. An ultrasound image was saved for documentation purposes. The micropuncture sheath was exchange for a 7-French vascular sheath over a guidewire. Over a Benson wire, a Kumpe catheter was advanced centrally and a central venogram was performed. Pull back venogram was performed with the Kumpe catheter. Heparin was administered systemically and TPA was administered via the Kumpe catheter throughout near the entirety of the venous limb. The venous anastomosis and majority of the venous limb was angioplastied with a 6 mm x 4 cm Conquest balloon. An additional access was obtained directed towards the arterial anastomoses with a micropuncture kit after the overlying soft tissues anesthetized with 1% lidocaine. This allowed for placement of a 6-French vascular sheath. The graft was thrombectomized with several rounds of push-pull mechanical thrombectomy with an occlusion balloon. Flow was restored to the graft as evidenced by restored pulsatility of the graft and brisk blood return from the side arm of the vascular sheath. Shuntograms were performed. Images were reviewed and the procedure was terminated.  At this point, the procedure was terminated. All wires, catheters and sheaths were removed from the patient. Hemostasis was achieved at both access sites with deployment of a swizzle sutures which will be removed at the patient's next dialysis session. Dressings were placed. The patient tolerated the procedure without immediate postprocedural complication. FINDINGS: The existing left forearm AV graft is thrombosed. The venous anastomosis and near the entirety of the venous limb was angioplastied to 6 mm diameter. The graft was successfully thrombectomized using mechanical and pharmacologic means as above. Completion shuntogram demonstrates the venous limb and anastomosis are widely patent. Small area of extravasation was noted at recent dialysis graft cannulation site however  this was successfully treated with manual compression following the completion of procedure. The central venous system, arterial limb and arterial anastomosis are widely patent. IMPRESSION: 1. Technically successful left forearm AV graft thrombolysis. 2. Successful angioplasty of the venous anastomosis and majority of the venous limb to 6 mm diameter. Completion shuntogram demonstrates the venous limb and anastomosis are widely patent. 3. The arterial anastomosis and arterial limb are widely patent. 4. The central venous system is widely patent. ACCESS: This graft would be amenable to future percutaneous intervention as clinically indicated. Electronically Signed   By: Sandi Mariscal M.D.   On: 12/28/2018 16:46   Dg Chest Port 1 View  Result Date: 12/24/2018 CLINICAL DATA:  Tachypnea EXAM: PORTABLE CHEST 1 VIEW COMPARISON:  01/18/2018 FINDINGS: The heart size and mediastinal contours are within normal limits. Both lungs are clear. The visualized skeletal structures are unremarkable. IMPRESSION: No acute abnormality of the lungs in AP portable projection. Electronically Signed   By: Eddie Candle M.D.   On: 12/24/2018 18:41   Ir Thrombectomy Av Fistula W/thrombolysis/pta Inc/shunt/img Left  Result Date: 12/28/2018 INDICATION: History of end-stage renal disease with recently failed renal transplant. Patient previously received dialysis via a left forearm graft which was most recently intervened on 12/22/2012. The left forearm dialysis graft has not been in use for the past 6 years however they were able to provide one session of dialysis via the graft earlier this week however unfortunately the graft has subsequently thrombosed. As such, patient presents now for attempted image guided thrombolysis of the chronic left forearm dialysis graft. EXAM: 1. FISTULALYSIS 2. ANGIOPLASTY OF VENOUS LIMB AND VENOUS ANASTOMOSIS 3. ULTRASOUND GUIDANCE FOR VENOUS ACCESS COMPARISON:  Left forearm graft declot-12/22/2012.  MEDICATIONS: Heparin 3000 units IV; TPA 4 mg into graft. CONTRAST:  50 cc Isovue-300 ANESTHESIA/SEDATION: Moderate (conscious) sedation was employed during this procedure. A total of Versed 1.5 mg and Fentanyl 75 mcg was administered intravenously. Moderate Sedation Time: 56 minutes. The patient's level of consciousness and vital signs were monitored continuously by radiology nursing throughout the procedure under my direct supervision. FLUOROSCOPY TIME:  10 minutes, 24 seconds (11 mGy) COMPLICATIONS: None immediate. TECHNIQUE: Informed written consent was obtained from the patient after a discussion of the risks, benefits and alternatives to treatment. Questions regarding the procedure were encouraged and answered. A timeout was performed prior to the initiation of the procedure. On physical examination, the existing left forearm arm dialysis graft was negative for palpable pulse or thrill. The skin overlying the graft was prepped and draped in the usual sterile fashion, and a sterile drape was applied covering the operative field. Maximum barrier sterile technique with sterile gowns and gloves were used for the procedure. Under ultrasound guidance, the dialysis graft was accessed directed towards the venous anastomosis with a micropuncture kit after the overlying soft tissues  were anesthetized with 1% lidocaine. An ultrasound image was saved for documentation purposes. The micropuncture sheath was exchange for a 7-French vascular sheath over a guidewire. Over a Benson wire, a Kumpe catheter was advanced centrally and a central venogram was performed. Pull back venogram was performed with the Kumpe catheter. Heparin was administered systemically and TPA was administered via the Kumpe catheter throughout near the entirety of the venous limb. The venous anastomosis and majority of the venous limb was angioplastied with a 6 mm x 4 cm Conquest balloon. An additional access was obtained directed towards the arterial  anastomoses with a micropuncture kit after the overlying soft tissues anesthetized with 1% lidocaine. This allowed for placement of a 6-French vascular sheath. The graft was thrombectomized with several rounds of push-pull mechanical thrombectomy with an occlusion balloon. Flow was restored to the graft as evidenced by restored pulsatility of the graft and brisk blood return from the side arm of the vascular sheath. Shuntograms were performed. Images were reviewed and the procedure was terminated. At this point, the procedure was terminated. All wires, catheters and sheaths were removed from the patient. Hemostasis was achieved at both access sites with deployment of a swizzle sutures which will be removed at the patient's next dialysis session. Dressings were placed. The patient tolerated the procedure without immediate postprocedural complication. FINDINGS: The existing left forearm AV graft is thrombosed. The venous anastomosis and near the entirety of the venous limb was angioplastied to 6 mm diameter. The graft was successfully thrombectomized using mechanical and pharmacologic means as above. Completion shuntogram demonstrates the venous limb and anastomosis are widely patent. Small area of extravasation was noted at recent dialysis graft cannulation site however this was successfully treated with manual compression following the completion of procedure. The central venous system, arterial limb and arterial anastomosis are widely patent. IMPRESSION: 1. Technically successful left forearm AV graft thrombolysis. 2. Successful angioplasty of the venous anastomosis and majority of the venous limb to 6 mm diameter. Completion shuntogram demonstrates the venous limb and anastomosis are widely patent. 3. The arterial anastomosis and arterial limb are widely patent. 4. The central venous system is widely patent. ACCESS: This graft would be amenable to future percutaneous intervention as clinically indicated.  Electronically Signed   By: Sandi Mariscal M.D.   On: 12/28/2018 16:46   Ct Renal Stone Study  Result Date: 12/25/2018 CLINICAL DATA:  Flank pain, history of renal transplant EXAM: CT ABDOMEN AND PELVIS WITHOUT CONTRAST TECHNIQUE: Multidetector CT imaging of the abdomen and pelvis was performed following the standard protocol without IV contrast. COMPARISON:  None. FINDINGS: Examination of the abdomen pelvis is generally limited by motion artifact throughout. Lower chest: No acute abnormality. Hepatobiliary: No focal liver abnormality is seen. The gallbladder is mildly distended and may contain dependent sludge; evaluation is particularly limited by breath motion artifact through this level. Pancreas: Unremarkable. No pancreatic ductal dilatation or surrounding inflammatory changes. Spleen: Normal in size without focal abnormality. Adrenals/Urinary Tract: Adrenal glands are unremarkable. 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 No hydronephrosis or ureteral calculus. 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 Bladder is unremarkable. Stomach/Bowel: Stomach is within normal limits. Appendix appears normal. The colon is fluid-filled to the rectum. Vascular/Lymphatic: No significant vascular findings are present. No enlarged abdominal or pelvic lymph nodes. Reproductive: No mass or other abnormality. Other: No abdominal wall hernia or abnormality. No abdominopelvic ascites. Musculoskeletal: No acute  or significant osseous findings. IMPRESSION: 1. Examination of the abdomen and pelvis is generally limited by motion artifact throughout. Within this limitation, no definite non-contrast CT findings to explain lower abdominal pain. 2. The gallbladder is mildly distended and may contain dependent sludge; evaluation is particularly limited by breath motion artifact through  this level. 3. 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 No hydronephrosis or ureteral calculus. 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 4. The colon is fluid-filled to the rectum, in keeping with diarrheal illness. Electronically Signed   By: Eddie Candle M.D.   On: 12/25/2018 08:43     LOS: 7 days   Oren Binet, MD  [facility]  If 7PM-7AM, please contact night-coverage  Please page via [email or web address]  Go to [email or web address] and use [place]'s universal password to access. If you do not have the password, please contact the hospital operator.  Locate the [facility] provider you are looking for under [facility] and page to a number that you can be directly reached. If you still have difficulty reaching the provider, please page the [facility] (Director on Call) for the Hospitalists listed on amion for assistance.  12/31/2018, 11:50 AM

## 2018-12-31 NOTE — Progress Notes (Signed)
PT Cancellation Note  Patient Details Name: [name] MRN: [id number] DOB: Sep 15, 1970   Cancelled Treatment:    Reason Eval/Treat Not Completed: Patient declined, no reason specified. Patient declining, "nobody told me I had this appointment with you".  States he would like to be seen on Monday before he goes to HD.  Reinaldo Berber, PT, DPT Acute Rehabilitation Services Pager: [phone] Office: [place] 12/31/2018, 10:00 AM

## 2018-12-31 NOTE — Progress Notes (Signed)
[facility] Progress Note   Subjective:   Feels better, no further diarrhea.  Appetite fair.  UOP 375 documented yesterday. No new syptoms.  Objective Vitals:   12/30/18 0605 12/30/18 2121 12/31/18 0515 12/31/18 0602  BP:  118/74 120/73   Pulse:  81 88   Resp:  18 18   Temp:  98.4 F (36.9 C) 98.8 F (37.1 C)   TempSrc:  Oral Oral   SpO2:  100% 99%   Weight: 58.8 kg   58.2 kg  Height:       Physical Exam General: lying comfortably in bed Heart:  RRR, no rub Lungs: normal WOB, no rales Abdomen: soft, NABS, no TTP Extremities: no edema Neuro: tremor of hands (he notes chronic), no asterixis Dialysis Access: L FA AVG +thrill   Additional Objective Labs: Basic Metabolic Panel: Recent Labs  Lab 12/29/18 0342 12/30/18 0256 12/31/18 0340  NA 135 137 137  K 3.5 3.5 3.4*  CL 104 102 102  CO2 20* 25 25  GLUCOSE 92 131* 116*  BUN 50* 21* 21*  CREATININE 6.28* 3.53* 4.04*  CALCIUM 6.8* 7.2* 7.5*  PHOS 2.6 2.1* 2.6   Liver Function Tests: Recent Labs  Lab 12/24/18 1804  12/29/18 0342 12/30/18 0256 12/31/18 0340  AST 9*  --   --   --   --   ALT 12  --   --   --   --   ALKPHOS 113  --   --   --   --   BILITOT 0.3  --   --   --   --   PROT 7.3  --   --   --   --   ALBUMIN 4.5   < > 2.5* 2.6* 2.7*   < > = values in this interval not displayed.   Recent Labs  Lab 12/24/18 1804  LIPASE 768*   CBC: Recent Labs  Lab 12/28/18 0329 12/29/18 0342 12/29/18 0944 12/30/18 0256 12/31/18 0340  WBC 9.4 7.9 9.3 7.2 7.3  HGB 7.5* 7.4* 7.6* 7.2* 7.4*  HCT 22.7* 22.3* 23.4* 22.5* 23.3*  MCV 78.0* 79.6* 80.4 80.4 82.0  PLT 63* 65* 70* 74* 82*   Blood Culture    Component Value Date/Time   SDES BLOOD SITE NOT SPECIFIED 12/24/2018 1804   SPECREQUEST  12/24/2018 1804    BOTTLES DRAWN AEROBIC AND ANAEROBIC Blood Culture adequate volume   CULT  12/24/2018 1804    NO GROWTH 5 DAYS Performed at [facility], [place] [address], [place], [place]  [address]    REPTSTATUS 12/29/2018 FINAL 12/24/2018 1804    Cardiac Enzymes: Recent Labs  Lab 12/24/18 1804 12/25/18 0110 12/25/18 0455  CKTOTAL  --  58  --   TROPONINI <0.03 <0.03 <0.03   CBG: Recent Labs  Lab 12/29/18 2140 12/30/18 0748 12/30/18 1209 12/30/18 1658 12/30/18 2122  GLUCAP 125* 88 149* 107* 120*   Iron Studies:  No results for input(s): IRON, TIBC, TRANSFERRIN, FERRITIN in the last 72 hours. @lablastinr3 @ Studies/Results: No results found. Medications:  . chlorhexidine  15 mL Mouth Rinse BID  . insulin aspart  0-5 Units Subcutaneous QHS  . insulin aspart  0-9 Units Subcutaneous TID WC  . insulin glargine  6 Units Subcutaneous Daily  . mouth rinse  15 mL Mouth Rinse q12n4p  . mycophenolate  720 mg Oral BID  . tacrolimus  6 mg Oral Daily   And  . tacrolimus  5 mg Oral QHS  Assessment/Plan: The patient is a 48 y.o. year old male patient with a past medical history significant for end-stage renal disease status post renal transplantation, type 2 diabetes, chronic anemia, hyperlipidemia, and hypertension who presented to the emergency department with increasing shortness of breath for approximately 3 weeks with associated nausea and vomiting.  1.  End-stage renal disease status post renal transplantation.  His most recent office visit to [facility] transplant was in 11/21/17.  He received a deceased donor renal transplant on 12/26/2013.  100% PRA, KDPII greater than 85.  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  His baseline creatinine as documented back in 11-21-17 was about 2-3.  He is on maintenance immunosuppression with tacrolimus 6 in the morning and 5 at night, and Myfortic 4 tabs twice daily.  He was previously on prednisone, but he reports that was discontinued.  Tac 5/11 not detected > 5/13 3.0 > 5/15 4.1.  Maintain current dose for  now.    2.  Acute kidney injury.  Unclear if this is acute GI issue leading to hypovolemia and AKI versus progression of underlying disease and uremia.  Cr 13 on admission, has rec'd HD x 2 (5/12 2 hrs, 5/15 3hrs).   Discussed renal biopsy and will plan to proceed tomorrow - discussed what I think are 3 likely results - 1. ATN, 2. Rejection acute or chronic, 3. Scarred kidneys with nothing to do; he's aware this may not change management but may shed light if he will be dialysis dependent long term.    3.  Diarrhea:  c diff and viral pathogen panel negative.  CMV VL still pending. Hold on GI consult given this issue has resolved.   4.  Diabetic ketoacidosis.  Resolved.  A1c is 7.2.  Lantus administered at 10am thus won't cut dose 50% for NPO pMN.  5.  Secondary hyperparathyroidism.  PTH is within goal for CKD stage.  Phosphorus is 5.4.  We will continue to monitor.  6.  Anemia of chronic kidney disease. Iron replete. Hb 12.2 on presentation, down into 6s.  Rec'd 1 u pRBC 5/12 with appropriate increase.  Hb stable in the 7s.  Of note he lost ~283mL blood when access clotted.  7.  Thrombocytopenia:  Chronic issue.  Plt have trended down during admission but in fact are now at his baseline.  I think a lot of the change this admission was hemoconcentration on presentation.     8.  Hypokalemia: KCl 40 po today.   Jannifer Hick MD 12/31/2018, 6:52 AM  [facility] Pager: [phone]

## 2019-01-01 ENCOUNTER — Inpatient Hospital Stay (HOSPITAL_COMMUNITY): Payer: Medicare Other

## 2019-01-01 LAB — RENAL FUNCTION PANEL
Albumin: 2.4 g/dL — ABNORMAL LOW (ref 3.5–5.0)
Anion gap: 9 (ref 5–15)
BUN: 20 mg/dL (ref 6–20)
CO2: 23 mmol/L (ref 22–32)
Calcium: 7.5 mg/dL — ABNORMAL LOW (ref 8.9–10.3)
Chloride: 104 mmol/L (ref 98–111)
Creatinine, Ser: 4.13 mg/dL — ABNORMAL HIGH (ref 0.61–1.24)
GFR calc Af Amer: 18 mL/min — ABNORMAL LOW (ref >60)
GFR calc non Af Amer: 16 mL/min — ABNORMAL LOW (ref >60)
Glucose, Bld: 135 mg/dL — ABNORMAL HIGH (ref 70–99)
Phosphorus: 3.1 mg/dL (ref 2.5–4.6)
Potassium: 3.9 mmol/L (ref 3.5–5.1)
Sodium: 136 mmol/L (ref 135–145)

## 2019-01-01 LAB — CBC
HCT: 22.1 % — ABNORMAL LOW (ref 39.0–52.0)
Hemoglobin: 7 g/dL — ABNORMAL LOW (ref 13.0–17.0)
MCH: 26 pg (ref 26.0–34.0)
MCHC: 31.7 g/dL (ref 30.0–36.0)
MCV: 82.2 fL (ref 80.0–100.0)
Platelets: 82 10*3/uL — ABNORMAL LOW (ref 150–400)
RBC: 2.69 MIL/uL — ABNORMAL LOW (ref 4.22–5.81)
RDW: 15.9 % — ABNORMAL HIGH (ref 11.5–15.5)
WBC: 5.3 10*3/uL (ref 4.0–10.5)
nRBC: 0 % (ref 0.0–0.2)

## 2019-01-01 LAB — GLUCOSE, CAPILLARY
Glucose-Capillary: 89 mg/dL (ref 70–99)
Glucose-Capillary: 80 mg/dL (ref 70–99)
Glucose-Capillary: 91 mg/dL (ref 70–99)
Glucose-Capillary: 79 mg/dL (ref 70–99)

## 2019-01-01 LAB — IRON AND TIBC
Iron: 26 ug/dL — ABNORMAL LOW (ref 45–182)
Saturation Ratios: 17 % — ABNORMAL LOW (ref 17.9–39.5)
TIBC: 155 ug/dL — ABNORMAL LOW (ref 250–450)
UIBC: 129 ug/dL

## 2019-01-01 LAB — PROTIME-INR
INR: 1.1 (ref 0.8–1.2)
Prothrombin Time: 14.3 seconds (ref 11.4–15.2)

## 2019-01-01 LAB — APTT: aPTT: 39 seconds — ABNORMAL HIGH (ref 24–36)

## 2019-01-01 MED ORDER — GELATIN ABSORBABLE 12-7 MM EX MISC
CUTANEOUS | Status: AC
  Filled 2019-01-01: qty 1

## 2019-01-01 MED ORDER — MIDAZOLAM HCL 2 MG/2ML IJ SOLN
INTRAMUSCULAR | Status: AC | PRN
  Administered 2019-01-01 (×2): 1 mg via INTRAVENOUS

## 2019-01-01 MED ORDER — LIDOCAINE HCL (PF) 1 % IJ SOLN
INTRAMUSCULAR | Status: AC
  Filled 2019-01-01: qty 30

## 2019-01-01 MED ORDER — DARBEPOETIN ALFA 200 MCG/0.4ML IJ SOSY
200.0000 ug | PREFILLED_SYRINGE | INTRAMUSCULAR | Status: DC
  Administered 2019-01-01: 200 ug via SUBCUTANEOUS
  Filled 2019-01-01 (×2): qty 0.4

## 2019-01-01 MED ORDER — MIDAZOLAM HCL 2 MG/2ML IJ SOLN
INTRAMUSCULAR | Status: AC
  Filled 2019-01-01: qty 2

## 2019-01-01 MED ORDER — FENTANYL CITRATE (PF) 100 MCG/2ML IJ SOLN
INTRAMUSCULAR | Status: AC | PRN
  Administered 2019-01-01: 50 ug via INTRAVENOUS

## 2019-01-01 MED ORDER — FENTANYL CITRATE (PF) 100 MCG/2ML IJ SOLN
INTRAMUSCULAR | Status: AC
  Filled 2019-01-01: qty 2

## 2019-01-01 MED ORDER — SODIUM CHLORIDE 0.9 % IV SOLN
INTRAVENOUS | Status: AC | PRN
  Administered 2019-01-01: 10 mL/h via INTRAVENOUS

## 2019-01-01 NOTE — Care Management Important Message (Signed)
Important Message  Patient Details  Name: [name] MRN: [id number] Date of Birth: 03-10-1971   Medicare Important Message Given:  Yes    [name] 01/01/2019, 4:07 PM

## 2019-01-01 NOTE — Procedures (Signed)
Interventional Radiology Procedure:   Indications: Transplant kidney with AKI  Procedure: Transplant kidney with AKI  Findings: 2 cores from lower pole.  No hematoma.  Complications: None     EBL: Less than 10 ml  Plan: Bedrest today.    Tacoya Altizer R. Anselm Pancoast, MD  Pager: [phone]

## 2019-01-01 NOTE — Progress Notes (Signed)
[facility] ROUNDING NOTE   Subjective:   48 year old gentleman with history of end-stage renal disease status post renal transplantation 2015.  Type 2 diabetes hyperlipidemia hypertension.  Presented with acute on chronic kidney disease.  He presented with nausea and vomiting x3 weeks.  He also was unable to keep his immunosuppressive medications down at this time.  He has a reported creatinine at baseline of 2 to 3 mg/dL.  In March 2020 his creatinine was 2.7 at [facility].  Blood pressure 132/78 pulse 81 temperature 98.6 O2 sats 90% room air.  Sodium 136 potassium 3.9 chloride 104 CO2 23 BUN 20 creatinine 4.13 calcium 7.5 phosphorus 3.1 albumin 2.4.  WBC 5.3 hemoglobin 7.0 platelets 82  Insulin glargine 6 units daily CellCept 720 mg twice daily tacrolimus 6 mg in the morning 5 mg in the evening  Urine output 500 cc 12/31/2018 weight 58.2 kg unchanged since 12/27/2018  His last dialysis was 12/30/2018.  Net ultrafiltration was 0.  Objective:  Vital signs in last 24 hours:  Temp:  [98.5 F (36.9 C)-98.6 F (37 C)] 98.6 F (37 C) (05/18 0529) Pulse Rate:  [81-85] 81 (05/18 0529) Resp:  [18-19] 18 (05/18 0529) BP: (115-132)/(75-78) 132/78 (05/18 0529) SpO2:  [97 %-100 %] 100 % (05/18 0529) Weight:  [58.1 kg] 58.1 kg (05/18 0603)  Weight change: -0.1 kg Filed Weights   12/30/18 0605 12/31/18 0602 01/01/19 0603  Weight: 58.8 kg 58.2 kg 58.1 kg    Intake/Output: I/O last 3 completed shifts: In: 420 [P.O.:420] Out: 500 [Urine:500]   Intake/Output this shift:  No intake/output data recorded. Nondistressed CVS- RRR no JVP murmurs rubs or gallops RS- CTA ABD-nondistended no organosplenomegaly nontender transplant EXT- no edema   Basic Metabolic Panel: Recent Labs  Lab 12/25/18 1340  12/27/18 0732 12/28/18 0329 12/28/18 1750 12/29/18 0342 12/30/18 0256 12/31/18 0340 01/01/19 0238  NA 143   < >  --  137 138 135 137 137 136  K 4.5   < >   --  2.3* 3.4* 3.5 3.5 3.4* 3.9  CL 127*   < >  --  100 104 104 102 102 104  CO2 <7*   < >  --  25 22 20* 25 25 23   GLUCOSE 189*   < >  --  92 84 92 131* 116* 135*  BUN 120*   < >  --  56* 50* 50* 21* 21* 20  CREATININE 10.75*   < >  --  6.63* 6.53* 6.28* 3.53* 4.04* 4.13*  CALCIUM 7.3*   < >  --  6.3* 6.9* 6.8* 7.2* 7.5* 7.5*  MG 1.5*  --  1.5*  --   --  1.7 2.2  --   --   PHOS  --    < >  --  2.1*  --  2.6 2.1* 2.6 3.1   < > = values in this interval not displayed.    Liver Function Tests: Recent Labs  Lab 12/28/18 0329 12/29/18 0342 12/30/18 0256 12/31/18 0340 01/01/19 0238  ALBUMIN 2.6* 2.5* 2.6* 2.7* 2.4*   No results for input(s): LIPASE, AMYLASE in the last 168 hours. No results for input(s): AMMONIA in the last 168 hours.  CBC: Recent Labs  Lab 12/29/18 0342 12/29/18 0944 12/30/18 0256 12/31/18 0340 01/01/19 0238  WBC 7.9 9.3 7.2 7.3 5.3  HGB 7.4* 7.6* 7.2* 7.4* 7.0*  HCT 22.3* 23.4* 22.5* 23.3* 22.1*  MCV 79.6* 80.4 80.4 82.0 82.2  PLT 65*  70* 74* 82* 82*    Cardiac Enzymes: No results for input(s): CKTOTAL, CKMB, CKMBINDEX, TROPONINI in the last 168 hours.  BNP: Invalid input(s): POCBNP  CBG: Recent Labs  Lab 12/30/18 2122 12/31/18 0757 12/31/18 1203 12/31/18 1652 12/31/18 2132  GLUCAP 120* 112* 138* 103 127*    Microbiology: Results for orders placed or performed during the hospital encounter of 12/24/18  Culture, blood (single)     Status: None   Collection Time: 12/24/18  6:04 PM  Result Value Ref Range Status   Specimen Description BLOOD SITE NOT SPECIFIED  Final   Special Requests   Final    BOTTLES DRAWN AEROBIC AND ANAEROBIC Blood Culture adequate volume   Culture   Final    NO GROWTH 5 DAYS Performed at [facility], [place] [address], [place], [place] [address]    Report Status 12/29/2018 FINAL  Final  SARS Coronavirus 2 (CEPHEID - Performed in [place] hospital lab), Hosp Order     Status: None   Collection Time: 12/24/18   6:19 PM  Result Value Ref Range Status   SARS Coronavirus 2 NEGATIVE NEGATIVE Final    Comment: (NOTE) If result is NEGATIVE SARS-CoV-2 target nucleic acids are NOT DETECTED. The SARS-CoV-2 RNA is generally detectable in upper and lower  respiratory specimens during the acute phase of infection. The lowest  concentration of SARS-CoV-2 viral copies this assay can detect is 250  copies / mL. A negative result does not preclude SARS-CoV-2 infection  and should not be used as the sole basis for treatment or other  patient management decisions.  A negative result may occur with  improper specimen collection / handling, submission of specimen other  than nasopharyngeal swab, presence of viral mutation(s) within the  areas targeted by this assay, and inadequate number of viral copies  (<250 copies / mL). A negative result must be combined with clinical  observations, patient history, and epidemiological information. If result is POSITIVE SARS-CoV-2 target nucleic acids are DETECTED. The SARS-CoV-2 RNA is generally detectable in upper and lower  respiratory specimens dur ing the acute phase of infection.  Positive  results are indicative of active infection with SARS-CoV-2.  Clinical  correlation with patient history and other diagnostic information is  necessary to determine patient infection status.  Positive results do  not rule out bacterial infection or co-infection with other viruses. If result is PRESUMPTIVE POSTIVE SARS-CoV-2 nucleic acids MAY BE PRESENT.   A presumptive positive result was obtained on the submitted specimen  and confirmed on repeat testing.  While 2019 novel coronavirus  (SARS-CoV-2) nucleic acids may be present in the submitted sample  additional confirmatory testing may be necessary for epidemiological  and / or clinical management purposes  to differentiate between  SARS-CoV-2 and other Sarbecovirus currently known to infect humans.  If clinically indicated  additional testing with an alternate test  methodology [phone]) is advised. The SARS-CoV-2 RNA is generally  detectable in upper and lower respiratory sp ecimens during the acute  phase of infection. The expected result is Negative. Fact Sheet for Patients:  StrictlyIdeas.no Fact Sheet for Healthcare Providers: BankingDealers.co.za This test is not yet approved or cleared by the [place] FDA and has been authorized for detection and/or diagnosis of SARS-CoV-2 by FDA under an Emergency Use Authorization (EUA).  This EUA will remain in effect (meaning this test can be used) for the duration of the COVID-19 declaration under Section 564(b)(1) of the Act, 21 U.S.C. section 360bbb-3(b)(1),  unless the authorization is terminated or revoked sooner. Performed at [facility], [place] [address] [place], [place] [address]   Stool culture (children & immunocomp patients)     Status: None   Collection Time: 12/25/18  7:20 PM  Result Value Ref Range Status   Salmonella/Shigella Screen Final report  Final   Campylobacter Culture Final report  Final   E coli, Shiga toxin Assay Negative Negative Final    Comment: (NOTE) Performed At: [facility] [place] [facility] [place], [place] [id number] Rush Farmer MD [id number]   C difficile quick scan w PCR reflex     Status: None   Collection Time: 12/25/18  7:20 PM  Result Value Ref Range Status   C Diff antigen NEGATIVE NEGATIVE Final   C Diff toxin NEGATIVE NEGATIVE Final   C Diff interpretation No C. difficile detected.  Final    Comment: Performed at [facility], [place] [address] [place], [place] [address]  STOOL CULTURE REFLEX - RSASHR     Status: None   Collection Time: 12/25/18  7:20 PM  Result Value Ref Range Status   Stool Culture result 1 (RSASHR) Comment  Final    Comment: (NOTE) No Salmonella or Shigella recovered. Performed At: [facility] [address] [place], [place] [id number] Rush Farmer MD [id number]   STOOL CULTURE Reflex - CMPCXR     Status: None   Collection Time: 12/25/18  7:20 PM  Result Value Ref Range Status   Stool Culture result 1 (CMPCXR) Comment  Final    Comment: (NOTE) No Campylobacter species isolated. Performed At: [facility] [place], [place] [id number] Rush Farmer MD [id number]   Gastrointestinal Panel by PCR , Stool     Status: None   Collection Time: 12/28/18 10:06 AM  Result Value Ref Range Status   Campylobacter species NOT DETECTED NOT DETECTED Final   Plesimonas shigelloides NOT DETECTED NOT DETECTED Final   Salmonella species NOT DETECTED NOT DETECTED Final   Yersinia enterocolitica NOT DETECTED NOT DETECTED Final   Vibrio species NOT DETECTED NOT DETECTED Final   Vibrio cholerae NOT DETECTED NOT DETECTED Final   Enteroaggregative E coli (EAEC) NOT DETECTED NOT DETECTED Final   Enteropathogenic E coli (EPEC) NOT DETECTED NOT DETECTED Final   Enterotoxigenic E coli (ETEC) NOT DETECTED NOT DETECTED Final   Shiga like toxin producing E coli (STEC) NOT DETECTED NOT DETECTED Final   Shigella/Enteroinvasive E coli (EIEC) NOT DETECTED NOT DETECTED Final   Cryptosporidium NOT DETECTED NOT DETECTED Final   Cyclospora cayetanensis NOT DETECTED NOT DETECTED Final   Entamoeba histolytica NOT DETECTED NOT DETECTED Final   Giardia lamblia NOT DETECTED NOT DETECTED Final   Adenovirus F40/41 NOT DETECTED NOT DETECTED Final   Astrovirus NOT DETECTED NOT DETECTED Final   Norovirus GI/GII NOT DETECTED NOT DETECTED Final   Rotavirus A NOT DETECTED NOT DETECTED Final   Sapovirus (I, II, IV, and V) NOT DETECTED NOT DETECTED Final    Comment: Performed at [facility], [place] [place], [place] [address]    Coagulation Studies: Recent Labs    12/31/18 0340 01/01/19 0238  LABPROT 14.0 14.3  INR 1.1 1.1    Urinalysis: No results for input(s): COLORURINE,  LABSPEC, PHURINE, GLUCOSEU, HGBUR, BILIRUBINUR, KETONESUR, PROTEINUR, UROBILINOGEN, NITRITE, LEUKOCYTESUR in the last 72 hours.  Invalid input(s): APPERANCEUR    Imaging: No results found.   Medications:   . sodium chloride 10 mL/hr (01/01/19 0750)   .  chlorhexidine  15 mL Mouth Rinse BID  . insulin aspart  0-5 Units Subcutaneous QHS  . insulin aspart  0-9 Units Subcutaneous TID WC  . insulin glargine  6 Units Subcutaneous Daily  . mouth rinse  15 mL Mouth Rinse q12n4p  . mycophenolate  720 mg Oral BID  . tacrolimus  6 mg Oral Daily   And  . tacrolimus  5 mg Oral QHS   sodium chloride, acetaminophen, iohexol, loperamide, phenol, promethazine  Assessment/ Plan:   Acute kidney injury on chronic kidney disease.  Baseline serum creatinine appears to be 2 to 3 mg/dL he presented with acute kidney injury.  His renal function appears to have stabilized but has not returned to baseline he is scheduled for renal biopsy 01/01/2019.  His last dialysis treatment was 12/30/2018  DKA on admission has resolved with insulin and now was transitioned to subcutaneous insulin  Metabolic acidosis has improved with resolution of DKA and initiation of dialysis.  Diarrhea continues to be problematic CMV PCR pending  Immunosuppressive medications.  Tacrolimus level 4.1  Hypokalemia secondary to diarrhea illness has stabilized  Chronic thrombocytopenia platelet counts remain stable  Anemia may benefit from transfusion.  Will send off some iron studies and initiate   LOS: [place] @TODAY @8 :09 AM

## 2019-01-01 NOTE — Care Management Important Message (Signed)
Important Message  Patient Details  Name: [name] MRN: [id number] Date of Birth: 05/19/1971   Medicare Important Message Given:    yes   Sharin Mons, RN 01/01/2019, 9:30 AM

## 2019-01-01 NOTE — Progress Notes (Signed)
PT Cancellation Note  Patient Details Name: [name] MRN: [id number] DOB: [birth year]   Cancelled Treatment:    Reason Eval/Treat Not Completed: Patient at procedure or test/unavailable. Pt off unit for kidney biopsy per chart review. Will continue to follow and initiate PT evaluation when pt is available and agreeable.    Thelma Comp 01/01/2019, 10:35 AM   Rolinda Roan, PT, DPT Acute Rehabilitation Services Pager: [phone] Office: [phone]

## 2019-01-01 NOTE — Consult Note (Signed)
Chief Complaint: Patient was seen in consultation today for random renal biopsy at the request of Dr Leanora Cover   Supervising Physician: Markus Daft  Patient Status: [facility] - In-pt  History of Present Illness: [name] is a 48 y.o. male   ESRD- Renal Tx 2015 DM; HTN; HLD; anemia Increasing SOB  Renal Tx 2015[facility] Maintenance immunosuppresion AKI:  3 likely results - 1. ATN, 2. Rejection acute or chronic, 3. Scarred kidneys with nothing to do; he's aware this may not change management but may shed light if he will be dialysis dependent long term.    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 and f/u by DR Groat   ESRD (end stage renal disease) on dialysis ([place])    1.Initiated HD via right per cate 12/30/2008 (MWF schedule at [facility]) 2.Left upper extremity A-V fistula by Dr Oneida Alar 12/30/2008 3.Aemia: Received Venofer 12/30/08 Ferritin 179 % sat 12 12/23/08, 4. Secondary Hyperparathyroidism- PTH 188, P 5.4, Ca 9.2 12/2008   Hemodialysis-associated hypotension    coreg d/c'd   Herniated lumbar disc without myelopathy    History of blood transfusion    Hyperparathyroidism due to renal insufficiency (HCC)    Hypertension    resloved after initating HD, now with post HD hypotension   Hypocalcemia    Hypomagnesemia    Kidney transplant as cause of abnormal reaction or later complication 2334   [facility]   Non-ischemic cardiomyopathy ([place])    EF 20-25% 12/23/2008>>EF 40-45% 03/2009>>EF ??? 7/?/2011   Occult blood positive stool    Psoriasis    scalp   Thrombocytopenia (HCC)    Vitamin B 12 deficiency     Past Surgical History:  Procedure  Laterality Date   AV FISTULA PLACEMENT     left   AV FISTULA PLACEMENT  09/12/2012   Procedure: INSERTION OF ARTERIOVENOUS (AV) GORE-TEX GRAFT ARM;  Surgeon: Elam Dutch, MD;  Location: [place];  Service: Vascular;  Laterality: Left;  KIDNEY TRANSPLANT FAILED   CATARACT EXTRACTION, BILATERAL     EYE SURGERY     INSERTION OF DIALYSIS CATHETER  07/14/2012   Procedure: INSERTION OF DIALYSIS CATHETER;  Surgeon: Mal Misty, MD;  [facility];  Service: Vascular;  Laterality: Right;  Ultrasound Guided Insertion of Internal Jugular Dialysis Catheter   IR THROMBECTOMY AV FISTULA W/THROMBOLYSIS/PTA INC/SHUNT/IMG LEFT Left 12/28/2018   IR US GUIDE VASC ACCESS LEFT  12/28/2018   KIDNEY TRANSPLANT  2013   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   KIDNEY TRANSPLANT  12/2013   second kidney transplant   lumbar disc sugery   10/17/2017   LUMBAR [place] TRANSPLANTED ORGAN  08/11/12   PHOTOCOAGULATION     for diabetic retinopathy 08/1999   SHUNTOGRAM N/A 07/11/2012   Procedure: Earney Mallet;  Surgeon: Serafina Mitchell, MD;  Location: [facility] CATH LAB;  Service: Cardiovascular;  Laterality: N/A;    Allergies: Lactose intolerance (gi); Other; Pollen extract; and Tape  Medications: Prior to Admission medications   Medication Sig Start Date End Date Taking? Authorizing Provider  aspirin EC 81 MG tablet Take 81 mg by mouth daily.  Yes [provider]  carvedilol (COREG) 6.25 MG tablet Take 1 tablet (6.25 mg total) by mouth 2 (two) times daily with a meal. 12/20/17  Yes [name], NP  dapsone 25 MG tablet Take 50 mg by mouth daily.   Yes [provider]  folic acid (FOLVITE) 1 MG tablet Take 1 mg by mouth daily.  06/24/15  Yes [provider]  mycophenolate (MYFORTIC) 180 MG EC tablet Take 720 mg by mouth 2 (two) times daily.    Yes [provider]  NOVOLOG FLEXPEN 100 UNIT/ML FlexPen INJECT 12 UNITS INTO THE SKIN 3 TIMES DAILY  BEFORE MEALS. Patient taking differently: Inject 1-10 Units into the skin 3 (three) times daily. Per sliding scale. 10/31/18  Yes [name], NP  omeprazole (PRILOSEC) 20 MG capsule Take 1 capsule (20 mg total) by mouth daily. 12/20/17  Yes [name], NP  tacrolimus (PROGRAF) 1 MG capsule Take 5-6 mg by mouth See admin instructions. 6 mg in the morning and 5 mg in the evening - 1000, 2200   Yes [provider]  TRESIBA FLEXTOUCH 100 UNIT/ML SOPN FlexTouch Pen Inject 5 Units into the skin at bedtime.    Yes [provider]  atorvastatin (LIPITOR) 20 MG tablet TAKE 1 TABLET BY MOUTH AT BEDTIME FOR CHOLESTEROL Patient not taking: Reported on 10/09/2018 08/02/18   [name], NP  Continuous Blood Gluc Receiver ([place] KIT) [place] 1 each by Does not apply route 3 (three) times daily. Use to test blood sugar three times daily. Dx: E11.9 08/03/18   [name], NP  Continuous Blood Gluc Sensor (DEXCOM G6 SENSOR) MISC 1 each by Does not apply route 3 (three) times daily. Use to test blood sugar three times daily. Dx: E11.9 08/03/18   [name], NP  Continuous Blood Gluc Transmit (DEXCOM G6 TRANSMITTER) MISC 1 each by Does not apply route 3 (three) times daily. Use to test blood sugar three times daily. Dx: E11.9 08/03/18   [name], NP  Insulin Pen Needle 31G X 5 MM MISC 5 Units/oz/day by Does not apply route at bedtime. Use 4 times daily to inject insulin to skin 12/07/17   Georgette Shell, MD  ketoconazole (NIZORAL) 2 % shampoo Apply topically 2 (two) times a week. Wash hair and beard.  Leave shampoo on skin for 5 mins before washing off. Patient not taking: Reported on 10/09/2018 04/24/14   Kelby Aline, MD  Magnesium 400 MG CAPS Take 1,600 mg by mouth 2 (two) times daily.     [provider]  sodium bicarbonate 650 MG tablet Take 1,300 mg by mouth 3 (three) times daily.  06/24/15   [provider]     Family  History  Problem Relation Age of Onset   Diabetes Father    Kidney disease Father    Other Father        amputation   Hypertension Mother    Dementia Mother    Diabetes Sister    Hypertension Sister    Diabetes Sister    Kidney disease Sister    Diabetes Sister    Hypertension Brother    Diabetes Brother    CAD Neg Hx    Sudden Cardiac Death Neg Hx    Congestive Heart Failure Neg Hx    Colon cancer Neg Hx    Rectal cancer Neg Hx    Esophageal cancer Neg Hx    Liver cancer Neg Hx  Social History   Socioeconomic History   Marital status: Married    Spouse name: Not on file   Number of children: 2   Years of education: Not on file   Highest education level: Not on file  Occupational History   Occupation: disabled  Social Designer, fashion/clothing strain: Somewhat hard   Food insecurity:    Worry: Never Alexis    Inability: Never Alexis   Transportation needs:    Medical: No    Non-medical: No  Tobacco Use   Smoking status: Never Smoker   Smokeless tobacco: Never Used  Substance and Sexual Activity   Alcohol use: No    Alcohol/week: 0.0 standard drinks   Drug use: No   Sexual activity: Not on file  Lifestyle   Physical activity:    Days per week: 4 days    Minutes per session: 30 min   Stress: Not at all  Relationships   Social connections:    Talks on phone: More than three times a week    Gets together: More than three times a week    Attends religious service: More than 4 times per year    Active member of club or organization: No    Attends meetings of clubs or organizations: Never    Relationship status: Married  Other Topics Concern   Not on file  Social History Narrative   Social History      Diet?       Do you drink/eat things with caffeine? yes      Marital status?            married                        What year were you married?       Do you live in a house, apartment, assisted living, condo,  trailer, etc.? apartment      Is it one or more stories? one      How many persons live in your home? 2      Do you have any pets in your home? (please list) no      Highest level of education completed?       Current or past profession: bus owner      Do you exercise?            yes                          Type & how often? Walking daily      Advanced Directives      Do you have a living will? no      Do you have a DNR form?            no                      If not, do you want to discuss one?      Do you have signed POA/HPOA for forms? no      Functional Status      Do you have difficulty bathing or dressing yourself? yes      Do you have difficulty preparing food or eating? no      Do you have difficulty managing your medications? yes      Do you have difficulty managing your finances? no      Do you have difficulty affording your  medications? yes    Review of Systems: A 12 point ROS discussed and pertinent positives are indicated in the HPI above.  All other systems are negative.  Review of Systems  Constitutional: Positive for activity change and fatigue. Negative for appetite change and fever.  Respiratory: Positive for shortness of breath. Negative for cough.   Cardiovascular: Negative for chest pain.  Gastrointestinal: Positive for nausea.  Neurological: Positive for weakness.  Psychiatric/Behavioral: Negative for behavioral problems and confusion.    Vital Signs: BP 132/78 (BP Location: Right Arm)    Pulse 81    Temp 98.6 F (37 C) (Oral)    Resp 18    Ht 5' 5"  (1.651 m)    Wt 128 lb 1.4 oz (58.1 kg)    SpO2 100%    BMI 21.31 kg/m   Physical Exam Vitals signs reviewed.  Cardiovascular:     Rate and Rhythm: Normal rate and regular rhythm.     Heart sounds: Normal heart sounds.  Pulmonary:     Breath sounds: Normal breath sounds.  Abdominal:     General: Bowel sounds are normal.  Musculoskeletal: Normal range of motion.  Skin:    General: Skin is  warm and dry.  Neurological:     Mental Status: He is alert and oriented to person, place, and time.  Psychiatric:        Mood and Affect: Mood normal.        Behavior: Behavior normal.        Thought Content: Thought content normal.        Judgment: Judgment normal.     Imaging: Ir US Guide Vasc Access Left  Result Date: 12/28/2018 INDICATION: History of end-stage renal disease with recently failed renal transplant. Patient previously received dialysis via a left forearm graft which was most recently intervened on 12/22/2012. The left forearm dialysis graft has not been in use for the past 6 years however they were able to provide one session of dialysis via the graft earlier this week however unfortunately the graft has subsequently thrombosed. As such, patient presents now for attempted image guided thrombolysis of the chronic left forearm dialysis graft. EXAM: 1. FISTULALYSIS 2. ANGIOPLASTY OF VENOUS LIMB AND VENOUS ANASTOMOSIS 3. ULTRASOUND GUIDANCE FOR VENOUS ACCESS COMPARISON:  Left forearm graft declot-12/22/2012. MEDICATIONS: Heparin 3000 units IV; TPA 4 mg into graft. CONTRAST:  50 cc Isovue-300 ANESTHESIA/SEDATION: Moderate (conscious) sedation was employed during this procedure. A total of Versed 1.5 mg and Fentanyl 75 mcg was administered intravenously. Moderate Sedation Time: 56 minutes. The patient's level of consciousness and vital signs were monitored continuously by radiology nursing throughout the procedure under my direct supervision. FLUOROSCOPY TIME:  10 minutes, 24 seconds (11 mGy) COMPLICATIONS: None immediate. TECHNIQUE: Informed written consent was obtained from the patient after a discussion of the risks, benefits and alternatives to treatment. Questions regarding the procedure were encouraged and answered. A timeout was performed prior to the initiation of the procedure. On physical examination, the existing left forearm arm dialysis graft was negative for palpable pulse or  thrill. The skin overlying the graft was prepped and draped in the usual sterile fashion, and a sterile drape was applied covering the operative field. Maximum barrier sterile technique with sterile gowns and gloves were used for the procedure. Under ultrasound guidance, the dialysis graft was accessed directed towards the venous anastomosis with a micropuncture kit after the overlying soft tissues were anesthetized with 1% lidocaine. An ultrasound image was saved for documentation purposes. The  micropuncture sheath was exchange for a 7-French vascular sheath over a guidewire. Over a Benson wire, a Kumpe catheter was advanced centrally and a central venogram was performed. Pull back venogram was performed with the Kumpe catheter. Heparin was administered systemically and TPA was administered via the Kumpe catheter throughout near the entirety of the venous limb. The venous anastomosis and majority of the venous limb was angioplastied with a 6 mm x 4 cm Conquest balloon. An additional access was obtained directed towards the arterial anastomoses with a micropuncture kit after the overlying soft tissues anesthetized with 1% lidocaine. This allowed for placement of a 6-French vascular sheath. The graft was thrombectomized with several rounds of push-pull mechanical thrombectomy with an occlusion balloon. Flow was restored to the graft as evidenced by restored pulsatility of the graft and brisk blood return from the side arm of the vascular sheath. Shuntograms were performed. Images were reviewed and the procedure was terminated. At this point, the procedure was terminated. All wires, catheters and sheaths were removed from the patient. Hemostasis was achieved at both access sites with deployment of a swizzle sutures which will be removed at the patient's next dialysis session. Dressings were placed. The patient tolerated the procedure without immediate postprocedural complication. FINDINGS: The existing left forearm AV  graft is thrombosed. The venous anastomosis and near the entirety of the venous limb was angioplastied to 6 mm diameter. The graft was successfully thrombectomized using mechanical and pharmacologic means as above. Completion shuntogram demonstrates the venous limb and anastomosis are widely patent. Small area of extravasation was noted at recent dialysis graft cannulation site however this was successfully treated with manual compression following the completion of procedure. The central venous system, arterial limb and arterial anastomosis are widely patent. IMPRESSION: 1. Technically successful left forearm AV graft thrombolysis. 2. Successful angioplasty of the venous anastomosis and majority of the venous limb to 6 mm diameter. Completion shuntogram demonstrates the venous limb and anastomosis are widely patent. 3. The arterial anastomosis and arterial limb are widely patent. 4. The central venous system is widely patent. ACCESS: This graft would be amenable to future percutaneous intervention as clinically indicated. Electronically Signed   By: Sandi Mariscal M.D.   On: 12/28/2018 16:46   Dg Chest Port 1 View  Result Date: 12/24/2018 CLINICAL DATA:  Tachypnea EXAM: PORTABLE CHEST 1 VIEW COMPARISON:  01/18/2018 FINDINGS: The heart size and mediastinal contours are within normal limits. Both lungs are clear. The visualized skeletal structures are unremarkable. IMPRESSION: No acute abnormality of the lungs in AP portable projection. Electronically Signed   By: Eddie Candle M.D.   On: 12/24/2018 18:41   Ir Thrombectomy Av Fistula W/thrombolysis/pta Inc/shunt/img Left  Result Date: 12/28/2018 INDICATION: History of end-stage renal disease with recently failed renal transplant. Patient previously received dialysis via a left forearm graft which was most recently intervened on 12/22/2012. The left forearm dialysis graft has not been in use for the past 6 years however they were able to provide one session of  dialysis via the graft earlier this week however unfortunately the graft has subsequently thrombosed. As such, patient presents now for attempted image guided thrombolysis of the chronic left forearm dialysis graft. EXAM: 1. FISTULALYSIS 2. ANGIOPLASTY OF VENOUS LIMB AND VENOUS ANASTOMOSIS 3. ULTRASOUND GUIDANCE FOR VENOUS ACCESS COMPARISON:  Left forearm graft declot-12/22/2012. MEDICATIONS: Heparin 3000 units IV; TPA 4 mg into graft. CONTRAST:  50 cc Isovue-300 ANESTHESIA/SEDATION: Moderate (conscious) sedation was employed during this procedure. A total of Versed 1.5 mg  and Fentanyl 75 mcg was administered intravenously. Moderate Sedation Time: 56 minutes. The patient's level of consciousness and vital signs were monitored continuously by radiology nursing throughout the procedure under my direct supervision. FLUOROSCOPY TIME:  10 minutes, 24 seconds (11 mGy) COMPLICATIONS: None immediate. TECHNIQUE: Informed written consent was obtained from the patient after a discussion of the risks, benefits and alternatives to treatment. Questions regarding the procedure were encouraged and answered. A timeout was performed prior to the initiation of the procedure. On physical examination, the existing left forearm arm dialysis graft was negative for palpable pulse or thrill. The skin overlying the graft was prepped and draped in the usual sterile fashion, and a sterile drape was applied covering the operative field. Maximum barrier sterile technique with sterile gowns and gloves were used for the procedure. Under ultrasound guidance, the dialysis graft was accessed directed towards the venous anastomosis with a micropuncture kit after the overlying soft tissues were anesthetized with 1% lidocaine. An ultrasound image was saved for documentation purposes. The micropuncture sheath was exchange for a 7-French vascular sheath over a guidewire. Over a Benson wire, a Kumpe catheter was advanced centrally and a central venogram  was performed. Pull back venogram was performed with the Kumpe catheter. Heparin was administered systemically and TPA was administered via the Kumpe catheter throughout near the entirety of the venous limb. The venous anastomosis and majority of the venous limb was angioplastied with a 6 mm x 4 cm Conquest balloon. An additional access was obtained directed towards the arterial anastomoses with a micropuncture kit after the overlying soft tissues anesthetized with 1% lidocaine. This allowed for placement of a 6-French vascular sheath. The graft was thrombectomized with several rounds of push-pull mechanical thrombectomy with an occlusion balloon. Flow was restored to the graft as evidenced by restored pulsatility of the graft and brisk blood return from the side arm of the vascular sheath. Shuntograms were performed. Images were reviewed and the procedure was terminated. At this point, the procedure was terminated. All wires, catheters and sheaths were removed from the patient. Hemostasis was achieved at both access sites with deployment of a swizzle sutures which will be removed at the patient's next dialysis session. Dressings were placed. The patient tolerated the procedure without immediate postprocedural complication. FINDINGS: The existing left forearm AV graft is thrombosed. The venous anastomosis and near the entirety of the venous limb was angioplastied to 6 mm diameter. The graft was successfully thrombectomized using mechanical and pharmacologic means as above. Completion shuntogram demonstrates the venous limb and anastomosis are widely patent. Small area of extravasation was noted at recent dialysis graft cannulation site however this was successfully treated with manual compression following the completion of procedure. The central venous system, arterial limb and arterial anastomosis are widely patent. IMPRESSION: 1. Technically successful left forearm AV graft thrombolysis. 2. Successful angioplasty  of the venous anastomosis and majority of the venous limb to 6 mm diameter. Completion shuntogram demonstrates the venous limb and anastomosis are widely patent. 3. The arterial anastomosis and arterial limb are widely patent. 4. The central venous system is widely patent. ACCESS: This graft would be amenable to future percutaneous intervention as clinically indicated. Electronically Signed   By: Sandi Mariscal M.D.   On: 12/28/2018 16:46   Ct Renal Stone Study  Result Date: 12/25/2018 CLINICAL DATA:  Flank pain, history of renal transplant EXAM: CT ABDOMEN AND PELVIS WITHOUT CONTRAST TECHNIQUE: Multidetector CT imaging of the abdomen and pelvis was performed following the standard protocol without  IV contrast. COMPARISON:  None. FINDINGS: Examination of the abdomen pelvis is generally limited by motion artifact throughout. Lower chest: No acute abnormality. Hepatobiliary: No focal liver abnormality is seen. The gallbladder is mildly distended and may contain dependent sludge; evaluation is particularly limited by breath motion artifact through this level. Pancreas: Unremarkable. No pancreatic ductal dilatation or surrounding inflammatory changes. Spleen: Normal in size without focal abnormality. Adrenals/Urinary Tract: Adrenal glands are unremarkable. 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 No hydronephrosis or ureteral calculus. 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 Bladder is unremarkable. Stomach/Bowel: Stomach is within normal limits. Appendix appears normal. The colon is fluid-filled to the rectum. Vascular/Lymphatic: No significant vascular findings are present. No enlarged abdominal or pelvic lymph nodes. Reproductive: No mass or other abnormality. Other: No abdominal wall hernia or abnormality. No abdominopelvic ascites. Musculoskeletal: No acute  or significant osseous findings. IMPRESSION: 1. Examination of the abdomen and pelvis is generally limited by motion artifact throughout. Within this limitation, no definite non-contrast CT findings to explain lower abdominal pain. 2. The gallbladder is mildly distended and may contain dependent sludge; evaluation is particularly limited by breath motion artifact through this level. 3. 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 No hydronephrosis or ureteral calculus. 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 4. The colon is fluid-filled to the rectum, in keeping with diarrheal illness. Electronically Signed   By: Eddie Candle M.D.   On: 12/25/2018 08:43    Labs:  CBC: Recent Labs    12/29/18 0944 12/30/18 0256 12/31/18 0340 01/01/19 0238  WBC 9.3 7.2 7.3 5.3  HGB 7.6* 7.2* 7.4* 7.0*  HCT 23.4* 22.5* 23.3* 22.1*  PLT 70* 74* 82* 82*    COAGS: Recent Labs    12/26/18 0754 12/31/18 0340 01/01/19 0238  INR 1.2 1.1 1.1  APTT 25 40* 39*    BMP: Recent Labs    12/29/18 0342 12/30/18 0256 12/31/18 0340 01/01/19 0238  NA 135 137 137 136  K 3.5 3.5 3.4* 3.9  CL 104 102 102 104  CO2 20* 25 25 23   GLUCOSE 92 131* 116* 135*  BUN 50* 21* 21* 20  CALCIUM 6.8* 7.2* 7.5* 7.5*  CREATININE 6.28* 3.53* 4.04* 4.13*  GFRNONAA 9* 19* 16* 16*  GFRAA 11* 22* 19* 18*    LIVER FUNCTION TESTS: Recent Labs    01/18/18 1134 02/06/18 1434 08/01/18 1620 12/24/18 1804  12/29/18 0342 12/30/18 0256 12/31/18 0340 01/01/19 0238  BILITOT 0.4 0.4 0.5 0.3  --   --   --   --   --   AST 11* 8* 10 9*  --   --   --   --   --   ALT 7* 4* 4* 12  --   --   --   --   --   ALKPHOS 54  --   --  113  --   --   --   --   --   PROT 6.7 6.6 6.4 7.3  --   --   --   --   --   ALBUMIN 4.4  --   --  4.5   < > 2.5* 2.6* 2.7* 2.4*   < > = values in this  interval not displayed.    TUMOR MARKERS: No results for input(s):  AFPTM, CEA, CA199, CHROMGRNA in the last 8760 hours.  Assessment and Plan:  ESRD Renal Tx 2015 HD- Left forearm graft declot 12/28/18: successful HD 5/12 and 5/15 Scheduled for left transplanted kidney biopsy Risks and benefits of random renal biopsy was discussed with the patient and/or patient's family including, but not limited to bleeding, infection, damage to adjacent structures or low yield requiring additional tests.  All of the questions were answered and there is agreement to proceed Consent signed and in chart  Thank you for this interesting consult.  I greatly enjoyed meeting [name] and look forward to participating in their care.  A copy of this report was sent to the requesting provider on this date.  Electronically Signed: Lavonia Drafts, PA-C 01/01/2019, 8:08 AM   I spent a total of 20 Minutes    in face to face in clinical consultation, greater than 50% of which was counseling/coordinating care for random renal biopsy

## 2019-01-01 NOTE — Progress Notes (Signed)
PROGRESS NOTE    [name]  FXT:[id number] DOB: [birth year] DOA: 12/24/2018 PCP: [name], NP   Brief Narrative:  Per HPI: Patient is a 48 y.o. male with history of renal transplant, DM-2, HTN, dyslipidemia-who presented with DKA, AKI on CKD stage III.  He was noted to have shortness of breath with nausea, vomiting, and diarrhea on presentation.   Assessment & Plan:   Principal Problem:   DKA (diabetic ketoacidoses) ([place]) Active Problems:   Anemia in chronic kidney disease   Essential hypertension, benign   Congestive dilated cardiomyopathy (HCC)   Deceased-donor kidney transplant   ARF (acute renal failure) ([place])   Renal transplant recipient   Acute renal failure superimposed on stage 3 chronic kidney disease (HCC)   Prolonged QT interval   Acute diarrhea   DKA: Resolved-on admission required insulin infusion-has been transitioned to subcutaneous insulin.  Blood glucose is currently well controlled.  Severe metabolic acidosis: Felt to be a combination of DKA, AKI.  Improved after resolution of DKA and HD.  AKI on CKD stage IV (history of renal transplant on immunosuppressive's): Worsening creatinine felt to be either secondary to diarrhea/hypovolemia or from progression of underlying CKD. Has undergone HD x2 during this hospital stay, nephrology following-with plans for renal biopsy today.   Diarrhea: 2 days-GI pathogen panel, C. difficile PCR negative.  CMV PCR pending.   History of renal transplantation: Remains on maintenance immunosuppression with tacrolimus and Myfortic.  Tacrolimus level is 4.1 and noted to be therapeutic.  Hypokalemia: Better-likely secondary to diarrhea.    This has been repleted yesterday.  Will recheck tomorrow.   Thrombocytopenia: Appears to be a chronic issue-has had thrombocytopenia going back to 2019.  No evidence of bleeding-follow for now.  Platelet count remains to be relatively stable over the past few days.  Likely  secondary to some hemoconcentration initially.  Anemia: Likely secondary to chronic kidney disease-no evidence of blood loss-hemoglobin has remained stable over the past few days, continue to follow CBC-and transfuse if less than 7.    He may require some transfusion in a.m. if counts continued to diminish.  Insulin-dependent DM-2: CBGs stable-continue Lantus 6 units and SSI.  Prolonged QTc interval: Resolved-probably secondary to hypokalemia   DVT prophylaxis: SCDs Code Status: Full Family Communication: None at bedside Disposition Plan: Plan for renal biopsy today and PT evaluation   Consultants:   Nephrology  Procedures:   None  Antimicrobials:   None   Subjective: Patient seen and evaluated today with no new acute complaints or concerns. No acute concerns or events noted overnight.  He is overall starting to feel much better and denies any nausea, vomiting, or diarrhea.  He has been tolerating his diet and he has been n.p.o. past midnight in anticipation for renal biopsy this a.m.  He is agreeable to PT evaluation today.  Objective: Vitals:   12/31/18 1817 12/31/18 2124 01/01/19 0529 01/01/19 0603  BP: 116/75 115/75 132/78   Pulse: 81 85 81   Resp: 19 18 18    Temp: 98.6 F (37 C) 98.5 F (36.9 C) 98.6 F (37 C)   TempSrc: Oral Oral Oral   SpO2: 97% 99% 100%   Weight:    58.1 kg  Height:        Intake/Output Summary (Last 24 hours) at 01/01/2019 0731 Last data filed at 12/31/2018 1356 Gross per 24 hour  Intake 420 ml  Output 500 ml  Net -80 ml   Filed Weights   12/30/18 0605 12/31/18  0602 01/01/19 0603  Weight: 58.8 kg 58.2 kg 58.1 kg    Examination:  General exam: Appears calm and comfortable  Respiratory system: Clear to auscultation. Respiratory effort normal. Cardiovascular system: S1 & S2 heard, RRR. No JVD, murmurs, rubs, gallops or clicks. No pedal edema. Gastrointestinal system: Abdomen is nondistended, soft and nontender. No organomegaly or  masses felt. Normal bowel sounds heard. Central nervous system: Alert and oriented. No focal neurological deficits. Extremities: Symmetric 5 x 5 power. Skin: No rashes, lesions or ulcers Psychiatry: Judgement and insight appear normal. Mood & affect appropriate.     Data Reviewed: I have personally reviewed following labs and imaging studies  CBC: Recent Labs  Lab 12/29/18 0342 12/29/18 0944 12/30/18 0256 12/31/18 0340 01/01/19 0238  WBC 7.9 9.3 7.2 7.3 5.3  HGB 7.4* 7.6* 7.2* 7.4* 7.0*  HCT 22.3* 23.4* 22.5* 23.3* 22.1*  MCV 79.6* 80.4 80.4 82.0 82.2  PLT 65* 70* 74* 82* 82*   Basic Metabolic Panel: Recent Labs  Lab 12/25/18 1340  12/27/18 0732 12/28/18 0329 12/28/18 1750 12/29/18 0342 12/30/18 0256 12/31/18 0340 01/01/19 0238  NA 143   < >  --  137 138 135 137 137 136  K 4.5   < >  --  2.3* 3.4* 3.5 3.5 3.4* 3.9  CL 127*   < >  --  100 104 104 102 102 104  CO2 <7*   < >  --  25 22 20* 25 25 23   GLUCOSE 189*   < >  --  92 84 92 131* 116* 135*  BUN 120*   < >  --  56* 50* 50* 21* 21* 20  CREATININE 10.75*   < >  --  6.63* 6.53* 6.28* 3.53* 4.04* 4.13*  CALCIUM 7.3*   < >  --  6.3* 6.9* 6.8* 7.2* 7.5* 7.5*  MG 1.5*  --  1.5*  --   --  1.7 2.2  --   --   PHOS  --    < >  --  2.1*  --  2.6 2.1* 2.6 3.1   < > = values in this interval not displayed.   GFR: Estimated Creatinine Clearance: 17.6 mL/min (A) (by C-G formula based on SCr of 4.13 mg/dL (H)). Liver Function Tests: Recent Labs  Lab 12/28/18 0329 12/29/18 0342 12/30/18 0256 12/31/18 0340 01/01/19 0238  ALBUMIN 2.6* 2.5* 2.6* 2.7* 2.4*   No results for input(s): LIPASE, AMYLASE in the last 168 hours. No results for input(s): AMMONIA in the last 168 hours. Coagulation Profile: Recent Labs  Lab 12/26/18 0754 12/31/18 0340 01/01/19 0238  INR 1.2 1.1 1.1   Cardiac Enzymes: No results for input(s): CKTOTAL, CKMB, CKMBINDEX, TROPONINI in the last 168 hours. BNP (last 3 results) No results for  input(s): PROBNP in the last 8760 hours. HbA1C: No results for input(s): HGBA1C in the last 72 hours. CBG: Recent Labs  Lab 12/30/18 2122 12/31/18 0757 12/31/18 1203 12/31/18 1652 12/31/18 2132  GLUCAP 120* 112* 138* 88 127*   Lipid Profile: No results for input(s): CHOL, HDL, LDLCALC, TRIG, CHOLHDL, LDLDIRECT in the last 72 hours. Thyroid Function Tests: No results for input(s): TSH, T4TOTAL, FREET4, T3FREE, THYROIDAB in the last 72 hours. Anemia Panel: No results for input(s): VITAMINB12, FOLATE, FERRITIN, TIBC, IRON, RETICCTPCT in the last 72 hours. Sepsis Labs: No results for input(s): PROCALCITON, LATICACIDVEN in the last 168 hours.  Recent Results (from the past 240 hour(s))  Culture, blood (single)  Status: None   Collection Time: 12/24/18  6:04 PM  Result Value Ref Range Status   Specimen Description BLOOD SITE NOT SPECIFIED  Final   Special Requests   Final    BOTTLES DRAWN AEROBIC AND ANAEROBIC Blood Culture adequate volume   Culture   Final    NO GROWTH 5 DAYS Performed at [facility], [address], [place], [place] [address]    Report Status 12/29/2018 FINAL  Final  SARS Coronavirus 2 (CEPHEID - Performed in [place] hospital lab), Hosp Order     Status: None   Collection Time: 12/24/18  6:19 PM  Result Value Ref Range Status   SARS Coronavirus 2 NEGATIVE NEGATIVE Final    Comment: (NOTE) If result is NEGATIVE SARS-CoV-2 target nucleic acids are NOT DETECTED. The SARS-CoV-2 RNA is generally detectable in upper and lower  respiratory specimens during the acute phase of infection. The lowest  concentration of SARS-CoV-2 viral copies this assay can detect is 250  copies / mL. A negative result does not preclude SARS-CoV-2 infection  and should not be used as the sole basis for treatment or other  patient management decisions.  A negative result may occur with  improper specimen collection / handling, submission of specimen other  than  nasopharyngeal swab, presence of viral mutation(s) within the  areas targeted by this assay, and inadequate number of viral copies  (<250 copies / mL). A negative result must be combined with clinical  observations, patient history, and epidemiological information. If result is POSITIVE SARS-CoV-2 target nucleic acids are DETECTED. The SARS-CoV-2 RNA is generally detectable in upper and lower  respiratory specimens dur ing the acute phase of infection.  Positive  results are indicative of active infection with SARS-CoV-2.  Clinical  correlation with patient history and other diagnostic information is  necessary to determine patient infection status.  Positive results do  not rule out bacterial infection or co-infection with other viruses. If result is PRESUMPTIVE POSTIVE SARS-CoV-2 nucleic acids MAY BE PRESENT.   A presumptive positive result was obtained on the submitted specimen  and confirmed on repeat testing.  While 2019 novel coronavirus  (SARS-CoV-2) nucleic acids may be present in the submitted sample  additional confirmatory testing may be necessary for epidemiological  and / or clinical management purposes  to differentiate between  SARS-CoV-2 and other Sarbecovirus currently known to infect humans.  If clinically indicated additional testing with an alternate test  methodology [phone]) is advised. The SARS-CoV-2 RNA is generally  detectable in upper and lower respiratory sp ecimens during the acute  phase of infection. The expected result is Negative. Fact Sheet for Patients:  StrictlyIdeas.no Fact Sheet for Healthcare Providers: BankingDealers.co.za This test is not yet approved or cleared by the [place] FDA and has been authorized for detection and/or diagnosis of SARS-CoV-2 by FDA under an Emergency Use Authorization (EUA).  This EUA will remain in effect (meaning this test can be used) for the duration of  the COVID-19 declaration under Section 564(b)(1) of the Act, 21 U.S.C. section 360bbb-3(b)(1), unless the authorization is terminated or revoked sooner. Performed at [facility], [place] [address], [place], [place] [address]   Stool culture (children & immunocomp patients)     Status: None   Collection Time: 12/25/18  7:20 PM  Result Value Ref Range Status   Salmonella/Shigella Screen Final report  Final   Campylobacter Culture Final report  Final   E coli, Shiga toxin Assay Negative Negative  Final    Comment: (NOTE) Performed At: [facility] [place], [place] [id number] Rush Farmer MD BJ:[id number]   C difficile quick scan w PCR reflex     Status: None   Collection Time: 12/25/18  7:20 PM  Result Value Ref Range Status   C Diff antigen NEGATIVE NEGATIVE Final   C Diff toxin NEGATIVE NEGATIVE Final   C Diff interpretation No C. difficile detected.  Final    Comment: Performed at [facility], [place] [address] [place], [place] [address]  STOOL CULTURE REFLEX - RSASHR     Status: None   Collection Time: 12/25/18  7:20 PM  Result Value Ref Range Status   Stool Culture result 1 (RSASHR) Comment  Final    Comment: (NOTE) No Salmonella or Shigella recovered. Performed At: [facility] [address] [place], [place] [id number] Rush Farmer MD IO:[id number]   STOOL CULTURE Reflex - CMPCXR     Status: None   Collection Time: 12/25/18  7:20 PM  Result Value Ref Range Status   Stool Culture result 1 (CMPCXR) Comment  Final    Comment: (NOTE) No Campylobacter species isolated. Performed At: [facility] [place], [place] [id number] Rush Farmer MD RC:[id number]   Gastrointestinal Panel by PCR , Stool     Status: None   Collection Time: 12/28/18 10:06 AM  Result Value Ref Range Status   Campylobacter species NOT DETECTED NOT DETECTED Final   Plesimonas shigelloides NOT DETECTED NOT DETECTED Final   Salmonella  species NOT DETECTED NOT DETECTED Final   Yersinia enterocolitica NOT DETECTED NOT DETECTED Final   Vibrio species NOT DETECTED NOT DETECTED Final   Vibrio cholerae NOT DETECTED NOT DETECTED Final   Enteroaggregative E coli (EAEC) NOT DETECTED NOT DETECTED Final   Enteropathogenic E coli (EPEC) NOT DETECTED NOT DETECTED Final   Enterotoxigenic E coli (ETEC) NOT DETECTED NOT DETECTED Final   Shiga like toxin producing E coli (STEC) NOT DETECTED NOT DETECTED Final   Shigella/Enteroinvasive E coli (EIEC) NOT DETECTED NOT DETECTED Final   Cryptosporidium NOT DETECTED NOT DETECTED Final   Cyclospora cayetanensis NOT DETECTED NOT DETECTED Final   Entamoeba histolytica NOT DETECTED NOT DETECTED Final   Giardia lamblia NOT DETECTED NOT DETECTED Final   Adenovirus F40/41 NOT DETECTED NOT DETECTED Final   Astrovirus NOT DETECTED NOT DETECTED Final   Norovirus GI/GII NOT DETECTED NOT DETECTED Final   Rotavirus A NOT DETECTED NOT DETECTED Final   Sapovirus (I, II, IV, and V) NOT DETECTED NOT DETECTED Final    Comment: Performed at [facility], [address] [place], [place] [address]         Radiology Studies: No results found.      Scheduled Meds:  chlorhexidine  15 mL Mouth Rinse BID   insulin aspart  0-5 Units Subcutaneous QHS   insulin aspart  0-9 Units Subcutaneous TID WC   insulin glargine  6 Units Subcutaneous Daily   mouth rinse  15 mL Mouth Rinse q12n4p   mycophenolate  720 mg Oral BID   tacrolimus  6 mg Oral Daily   And   tacrolimus  5 mg Oral QHS   Continuous Infusions:   LOS: 8 days    Time spent: 30 minutes    Demarrio Menges Darleen Crocker, DO [facility] Pager [phone]  If 7PM-7AM, please contact night-coverage [email or web address] Password [facility] 01/01/2019, 7:31 AM

## 2019-01-02 LAB — CBC
HCT: 20.5 % — ABNORMAL LOW (ref 39.0–52.0)
Hemoglobin: 6.6 g/dL — CL (ref 13.0–17.0)
MCH: 26.6 pg (ref 26.0–34.0)
MCHC: 32.2 g/dL (ref 30.0–36.0)
MCV: 82.7 fL (ref 80.0–100.0)
Platelets: 94 10*3/uL — ABNORMAL LOW (ref 150–400)
RBC: 2.48 MIL/uL — ABNORMAL LOW (ref 4.22–5.81)
RDW: 15.7 % — ABNORMAL HIGH (ref 11.5–15.5)
WBC: 5.2 10*3/uL (ref 4.0–10.5)
nRBC: 0 % (ref 0.0–0.2)

## 2019-01-02 LAB — GLUCOSE, CAPILLARY
Glucose-Capillary: 84 mg/dL (ref 70–99)
Glucose-Capillary: 113 mg/dL — ABNORMAL HIGH (ref 70–99)
Glucose-Capillary: 101 mg/dL — ABNORMAL HIGH (ref 70–99)
Glucose-Capillary: 120 mg/dL — ABNORMAL HIGH (ref 70–99)

## 2019-01-02 LAB — BASIC METABOLIC PANEL
Anion gap: 7 (ref 5–15)
BUN: 20 mg/dL (ref 6–20)
CO2: 23 mmol/L (ref 22–32)
Calcium: 7.9 mg/dL — ABNORMAL LOW (ref 8.9–10.3)
Chloride: 104 mmol/L (ref 98–111)
Creatinine, Ser: 4.11 mg/dL — ABNORMAL HIGH (ref 0.61–1.24)
GFR calc Af Amer: 18 mL/min — ABNORMAL LOW (ref >60)
GFR calc non Af Amer: 16 mL/min — ABNORMAL LOW (ref >60)
Glucose, Bld: 137 mg/dL — ABNORMAL HIGH (ref 70–99)
Potassium: 4.1 mmol/L (ref 3.5–5.1)
Sodium: 134 mmol/L — ABNORMAL LOW (ref 135–145)

## 2019-01-02 LAB — HEMOGLOBIN AND HEMATOCRIT, BLOOD
HCT: 26 % — ABNORMAL LOW (ref 39.0–52.0)
Hemoglobin: 8.5 g/dL — ABNORMAL LOW (ref 13.0–17.0)

## 2019-01-02 LAB — PREPARE RBC (CROSSMATCH)

## 2019-01-02 MED ORDER — SODIUM CHLORIDE 0.9 % IV SOLN
510.0000 mg | Freq: Once | INTRAVENOUS | Status: AC
  Administered 2019-01-02: 510 mg via INTRAVENOUS
  Filled 2019-01-02: qty 17

## 2019-01-02 MED ORDER — INSULIN GLARGINE 100 UNIT/ML ~~LOC~~ SOLN
4.0000 [IU] | Freq: Every day | SUBCUTANEOUS | Status: DC
  Administered 2019-01-03: 4 [IU] via SUBCUTANEOUS
  Filled 2019-01-02: qty 0.04

## 2019-01-02 MED ORDER — SODIUM CHLORIDE 0.9% IV SOLUTION: Freq: Once | INTRAVENOUS | Status: DC

## 2019-01-02 NOTE — Progress Notes (Signed)
[facility] ROUNDING NOTE   Subjective:   48 year old gentleman with history of end-stage renal disease status post renal transplantation 2015.  Type 2 diabetes hyperlipidemia hypertension.  Presented with acute on chronic kidney disease.  He presented with nausea and vomiting x3 weeks.  He also was unable to keep his immunosuppressive medications down at this time.  He has a reported creatinine at baseline of 2 to 3 mg/dL.  In March 2020 his creatinine was 2.7 at [facility].  He underwent renal transplant biopsy 01/01/2019.  He is curious about when he can leave to go home.  Blood pressure 130/80 pulse 83 temperature 98.2 O2 sats 100% room air.  Sodium 134 potassium 4.1 chloride 104 CO2 23 BUN 20 creatinine 4.11 calcium 7.9 glucose 137 hemoglobin 6.6 WBC 5.2 platelets 94  Darbepoetin 200 mcg administered 01/01/2019, insulin sliding scale, glargine 6 units subcu 10 AM, Myfortic 720 mg twice daily, tacrolimus 6 mg 10 AM 5 mg 10 PM   Urine output 475 cc 12/31/2018   weight essentially unchanged 57.8 kg 01/02/2019  His last dialysis was 12/30/2018.  Net ultrafiltration was 0.  Objective:  Vital signs in last 24 hours:  Temp:  [96.8 F (36 C)-98.7 F (37.1 C)] 98.2 F (36.8 C) (05/19 0756) Pulse Rate:  [78-85] 83 (05/19 0756) Resp:  [16-17] 16 (05/19 0756) BP: (117-132)/(69-83) 130/80 (05/19 0756) SpO2:  [100 %] 100 % (05/19 0754) Weight:  [57.8 kg] 57.8 kg (05/19 0527)  Weight change: -0.3 kg Filed Weights   12/31/18 0602 01/01/19 0603 01/02/19 0527  Weight: 58.2 kg 58.1 kg 57.8 kg    Intake/Output: I/O last 3 completed shifts: In: -  Out: 475 [Urine:475]   Intake/Output this shift:  No intake/output data recorded. Nondistressed CVS- RRR no JVP murmurs rubs or gallops RS- CTA ABD-nondistended no organosplenomegaly nontender transplant EXT- no edema   Basic Metabolic Panel: Recent Labs  Lab 12/27/18 0732 12/28/18 0329  12/29/18 0342  12/30/18 0256 12/31/18 0340 01/01/19 0238 01/02/19 0238  NA  --  137   < > 135 137 137 136 134*  K  --  2.3*   < > 3.5 3.5 3.4* 3.9 4.1  CL  --  100   < > 104 102 102 104 104  CO2  --  25   < > 20* 25 25 23 23   GLUCOSE  --  92   < > 92 131* 116* 135* 137*  BUN  --  56*   < > 50* 21* 21* 20 20  CREATININE  --  6.63*   < > 6.28* 3.53* 4.04* 4.13* 4.11*  CALCIUM  --  6.3*   < > 6.8* 7.2* 7.5* 7.5* 7.9*  MG 1.5*  --   --  1.7 2.2  --   --   --   PHOS  --  2.1*  --  2.6 2.1* 2.6 3.1  --    < > = values in this interval not displayed.    Liver Function Tests: Recent Labs  Lab 12/28/18 0329 12/29/18 0342 12/30/18 0256 12/31/18 0340 01/01/19 0238  ALBUMIN 2.6* 2.5* 2.6* 2.7* 2.4*   No results for input(s): LIPASE, AMYLASE in the last 168 hours. No results for input(s): AMMONIA in the last 168 hours.  CBC: Recent Labs  Lab 12/29/18 0944 12/30/18 0256 12/31/18 0340 01/01/19 0238 01/02/19 0238  WBC 9.3 7.2 7.3 5.3 5.2  HGB 7.6* 7.2* 7.4* 7.0* 6.6*  HCT 23.4* 22.5* 23.3* 22.1* 20.5*  MCV 80.4 80.4 82.0 82.2 82.7  PLT 70* 74* 82* 82* 94*    Cardiac Enzymes: No results for input(s): CKTOTAL, CKMB, CKMBINDEX, TROPONINI in the last 168 hours.  BNP: Invalid input(s): POCBNP  CBG: Recent Labs  Lab 01/01/19 0935 01/01/19 1234 01/01/19 1707 01/01/19 2243 01/02/19 0826  GLUCAP 89 80 91 79 84    Microbiology: Results for orders placed or performed during the hospital encounter of 12/24/18  Culture, blood (single)     Status: None   Collection Time: 12/24/18  6:04 PM  Result Value Ref Range Status   Specimen Description BLOOD SITE NOT SPECIFIED  Final   Special Requests   Final    BOTTLES DRAWN AEROBIC AND ANAEROBIC Blood Culture adequate volume   Culture   Final    NO GROWTH 5 DAYS Performed at [facility], [address], [place],  [address]    Report Status 12/29/2018 FINAL  Final  SARS Coronavirus 2 (CEPHEID - Performed in [place] hospital  lab), Hosp Order     Status: None   Collection Time: 12/24/18  6:19 PM  Result Value Ref Range Status   SARS Coronavirus 2 NEGATIVE NEGATIVE Final    Comment: (NOTE) If result is NEGATIVE SARS-CoV-2 target nucleic acids are NOT DETECTED. The SARS-CoV-2 RNA is generally detectable in upper and lower  respiratory specimens during the acute phase of infection. The lowest  concentration of SARS-CoV-2 viral copies this assay can detect is 250  copies / mL. A negative result does not preclude SARS-CoV-2 infection  and should not be used as the sole basis for treatment or other  patient management decisions.  A negative result may occur with  improper specimen collection / handling, submission of specimen other  than nasopharyngeal swab, presence of viral mutation(s) within the  areas targeted by this assay, and inadequate number of viral copies  (<250 copies / mL). A negative result must be combined with clinical  observations, patient history, and epidemiological information. If result is POSITIVE SARS-CoV-2 target nucleic acids are DETECTED. The SARS-CoV-2 RNA is generally detectable in upper and lower  respiratory specimens dur ing the acute phase of infection.  Positive  results are indicative of active infection with SARS-CoV-2.  Clinical  correlation with patient history and other diagnostic information is  necessary to determine patient infection status.  Positive results do  not rule out bacterial infection or co-infection with other viruses. If result is PRESUMPTIVE POSTIVE SARS-CoV-2 nucleic acids MAY BE PRESENT.   A presumptive positive result was obtained on the submitted specimen  and confirmed on repeat testing.  While 2019 novel coronavirus  (SARS-CoV-2) nucleic acids may be present in the submitted sample  additional confirmatory testing may be necessary for epidemiological  and / or clinical management purposes  to differentiate between  SARS-CoV-2 and other Sarbecovirus  currently known to infect humans.  If clinically indicated additional testing with an alternate test  methodology [phone]) is advised. The SARS-CoV-2 RNA is generally  detectable in upper and lower respiratory sp ecimens during the acute  phase of infection. The expected result is Negative. Fact Sheet for Patients:  StrictlyIdeas.no Fact Sheet for Healthcare Providers: BankingDealers.co.za This test is not yet approved or cleared by the [place] FDA and has been authorized for detection and/or diagnosis of SARS-CoV-2 by FDA under an Emergency Use Authorization (EUA).  This EUA will remain in effect (meaning this test can be used) for the duration of the COVID-19 declaration under  Section 564(b)(1) of the Act, 21 U.S.C. section 360bbb-3(b)(1), unless the authorization is terminated or revoked sooner. Performed at [facility], [place] [address], [place], [place] [address]   Stool culture (children & immunocomp patients)     Status: None   Collection Time: 12/25/18  7:20 PM  Result Value Ref Range Status   Salmonella/Shigella Screen Final report  Final   Campylobacter Culture Final report  Final   E coli, Shiga toxin Assay Negative Negative Final    Comment: (NOTE) Performed At: [facility] [place], [place] [id number] Rush Farmer MD [id number]   C difficile quick scan w PCR reflex     Status: None   Collection Time: 12/25/18  7:20 PM  Result Value Ref Range Status   C Diff antigen NEGATIVE NEGATIVE Final   C Diff toxin NEGATIVE NEGATIVE Final   C Diff interpretation No C. difficile detected.  Final    Comment: Performed at [facility], [place] [address], [place], [place] [address]  STOOL CULTURE REFLEX - RSASHR     Status: None   Collection Time: 12/25/18  7:20 PM  Result Value Ref Range Status   Stool Culture result 1 (RSASHR) Comment  Final    Comment: (NOTE) No Salmonella or Shigella  recovered. Performed At: [facility] [address] [place], [place] [id number] Rush Farmer MD [id number]   STOOL CULTURE Reflex - CMPCXR     Status: None   Collection Time: 12/25/18  7:20 PM  Result Value Ref Range Status   Stool Culture result 1 (CMPCXR) Comment  Final    Comment: (NOTE) No Campylobacter species isolated. Performed At: [facility] [place], [place] [id number] Rush Farmer MD GQ:[id number]   Gastrointestinal Panel by PCR , Stool     Status: None   Collection Time: 12/28/18 10:06 AM  Result Value Ref Range Status   Campylobacter species NOT DETECTED NOT DETECTED Final   Plesimonas shigelloides NOT DETECTED NOT DETECTED Final   Salmonella species NOT DETECTED NOT DETECTED Final   Yersinia enterocolitica NOT DETECTED NOT DETECTED Final   Vibrio species NOT DETECTED NOT DETECTED Final   Vibrio cholerae NOT DETECTED NOT DETECTED Final   Enteroaggregative E coli (EAEC) NOT DETECTED NOT DETECTED Final   Enteropathogenic E coli (EPEC) NOT DETECTED NOT DETECTED Final   Enterotoxigenic E coli (ETEC) NOT DETECTED NOT DETECTED Final   Shiga like toxin producing E coli (STEC) NOT DETECTED NOT DETECTED Final   Shigella/Enteroinvasive E coli (EIEC) NOT DETECTED NOT DETECTED Final   Cryptosporidium NOT DETECTED NOT DETECTED Final   Cyclospora cayetanensis NOT DETECTED NOT DETECTED Final   Entamoeba histolytica NOT DETECTED NOT DETECTED Final   Giardia lamblia NOT DETECTED NOT DETECTED Final   Adenovirus F40/41 NOT DETECTED NOT DETECTED Final   Astrovirus NOT DETECTED NOT DETECTED Final   Norovirus GI/GII NOT DETECTED NOT DETECTED Final   Rotavirus A NOT DETECTED NOT DETECTED Final   Sapovirus (I, II, IV, and V) NOT DETECTED NOT DETECTED Final    Comment: Performed at [facility], [address] [place], [place] [address]    Coagulation Studies: Recent Labs    12/31/18 0340 01/01/19 0238  LABPROT 14.0 14.3  INR 1.1  1.1    Urinalysis: No results for input(s): COLORURINE, LABSPEC, PHURINE, GLUCOSEU, HGBUR, BILIRUBINUR, KETONESUR, PROTEINUR, UROBILINOGEN, NITRITE, LEUKOCYTESUR in the last 72 hours.  Invalid input(s): APPERANCEUR    Imaging: US Biopsy (kidney)  Result Date: 01/01/2019  INDICATION: 48 year old with history of renal transplant. Acute kidney injury. Request for renal transplant biopsy. EXAM: ULTRASOUND-GUIDED RENAL TRANSPLANT BIOPSY MEDICATIONS: None. ANESTHESIA/SEDATION: Moderate (conscious) sedation was employed during this procedure. A total of Versed 2.0 mg and Fentanyl 50 mcg was administered intravenously. Moderate Sedation Time: 17 minutes. The patient's level of consciousness and vital signs were monitored continuously by radiology nursing throughout the procedure under my direct supervision. FLUOROSCOPY TIME:  Fluoroscopy Time: None COMPLICATIONS: None immediate. PROCEDURE: Informed written consent was obtained from the patient after a thorough discussion of the procedural risks, benefits and alternatives. All questions were addressed. Maximal Sterile Barrier Technique was utilized including caps, mask, sterile gowns, sterile gloves, sterile drape, hand hygiene and skin antiseptic. A timeout was performed prior to the initiation of the procedure. Left lower quadrant was evaluated with ultrasound. The transplant kidney was easily identified. The left lower abdomen was prepped with chlorhexidine and sterile field was created. Skin and soft tissues were anesthetized with 1% lidocaine. The lower pole of the kidney was imaged in a transverse dimension. 16 gauge core needle was directed into the lower pole from a medial to lateral approach. Total of 2 core biopsies were obtained and specimens placed in saline. Adequate sized core biopsies were obtained. Bandage placed over the puncture site. FINDINGS: Transplant kidney in the left lower quadrant of the abdomen. Two core biopsies obtained from the lower  pole. No evidence for bleeding or hematoma formation. Adequate core biopsies obtained. IMPRESSION: Ultrasound-guided core biopsies of the renal transplant. Electronically Signed   By: Markus Daft M.D.   On: 01/01/2019 11:01     Medications:    . sodium chloride   Intravenous Once  . chlorhexidine  15 mL Mouth Rinse BID  . darbepoetin (ARANESP) injection - NON-DIALYSIS  200 mcg Subcutaneous Q Mon-1800  . insulin aspart  0-5 Units Subcutaneous QHS  . insulin aspart  0-9 Units Subcutaneous TID WC  . insulin glargine  6 Units Subcutaneous Daily  . mouth rinse  15 mL Mouth Rinse q12n4p  . mycophenolate  720 mg Oral BID  . tacrolimus  6 mg Oral Daily   And  . tacrolimus  5 mg Oral QHS   acetaminophen, iohexol, loperamide, phenol, promethazine  Assessment/ Plan:   Acute kidney injury on chronic kidney disease.  Baseline serum creatinine appears to be 2 to 3 mg/dL he presented with acute kidney injury.  His renal function appears to have stabilized but has not returned to baseline  His last dialysis treatment was 12/30/2018.  Status post renal transplant biopsy 01/01/2019  DKA on admission has resolved with insulin and now was transitioned to subcutaneous insulin.  Appears to have resolved  Metabolic acidosis has improved with resolution of DKA and initiation of dialysis.  Diarrhea continues to be problematic CMV PCR pending  Immunosuppressive medications.  Tacrolimus level 4.1.  Appears to be stable on tacrolimus and CellCept   Nausea vomiting and diarrhea appears to have improved  Chronic thrombocytopenia platelet counts remain stable  Anemia may benefit from transfusion.  T sats 17%.  Patient to receive blood transfusion.  Started darbepoetin 01/01/2019   LOS: [place] @TODAY @10 :11 AM

## 2019-01-02 NOTE — Evaluation (Signed)
Physical Therapy Evaluation & Discharge Patient Details Name: [name] MRN: [id number] DOB: 29-Aug-1970 Today's Date: 01/02/2019   History of Present Illness  Pt is a 48 y.o. male admitted5/10/20 with SOB and nausea/vomiting. Worked up for severe DKA; worsening anemia likely related to CKD. S/p renal biopsy 5/18. PMH includes DM2, anemia, HTN, renal transplant (s/p 2015; followed at [facility]).     Clinical Impression  Patient evaluated by Physical Therapy with no further acute PT needs identified. PTA, pt mod indep and lives with wife who assists as needed. Today, pt ambulatory at supervision-level. All education has been completed and the patient has no further questions. Pt declined continued acute PT services while admitted; reinforced importance of continued mobility with nursing staff during hospital admission. Acute PT is signing off. Thank you for this referral.    Follow Up Recommendations No PT follow up;Supervision for mobility/OOB    Equipment Recommendations  None recommended by PT    Recommendations for Other Services       Precautions / Restrictions Precautions Precautions: Fall Restrictions Weight Bearing Restrictions: No      Mobility  Bed Mobility Overal bed mobility: Independent                Transfers Overall transfer level: Modified independent Equipment used: Rolling Bless (2 wheeled)                Ambulation/Gait Ambulation/Gait assistance: Supervision Gait Distance (Feet): 5 Feet Assistive device: Rolling [place] (2 wheeled);None Gait Pattern/deviations: Step-through pattern;Decreased stride length;Trunk flexed Gait velocity: Decreased   General Gait Details: Slow, fatigued gait with RW; declined further distance due to fatigue. Amb additional steps without DME/UE support  Stairs            Wheelchair Mobility    Modified Rankin (Stroke Patients Only)       Balance Overall balance assessment: Needs  assistance   Sitting balance-Leahy Scale: Good       Standing balance-Leahy Scale: Good               High level balance activites: Side stepping;Backward walking;Direction changes;Turns;Head turns High Level Balance Comments: No overt instabiltiy or LOB with higher level balance activities             Pertinent Vitals/Pain Pain Assessment: No/denies pain    Home Living Family/patient expects to be discharged to:: Private residence Living Arrangements: Spouse/significant other Available Help at Discharge: Family;Available 24 hours/day Type of Home: Apartment Home Access: Level entry     Home Layout: One level Home Equipment: Grab bars - tub/shower;Grab bars - toilet;Bedside commode;Cane - quad;[place] - 2 wheels Additional Comments: wife is a retired [employer] manager and acts as caretaker for patient at home; they have a handicapped apartment with full accessibility     Prior Function Level of Independence: Independent with assistive device(s)         Comments: Intermittent use of RW or cane with fatigue. Reports he does a walking program around apartment complex     Hand Dominance        Extremity/Trunk Assessment   Upper Extremity Assessment Upper Extremity Assessment: Overall WFL for tasks assessed    Lower Extremity Assessment Lower Extremity Assessment: Generalized weakness       Communication   Communication: No difficulties  Cognition Arousal/Alertness: Awake/alert Behavior During Therapy: WFL for tasks assessed/performed Overall Cognitive Status: Within Functional Limits for tasks assessed  General Comments      Exercises     Assessment/Plan    PT Assessment Patent does not need any further PT services  PT Problem List         PT Treatment Interventions      PT Goals (Current goals can be found in the Care Plan section)  Acute Rehab PT Goals PT Goal Formulation: All assessment and  education complete, DC therapy    Frequency     Barriers to discharge        Co-evaluation               AM-PAC PT "6 Clicks" Mobility  Outcome Measure Help needed turning from your back to your side while in a flat bed without using bedrails?: None Help needed moving from lying on your back to sitting on the side of a flat bed without using bedrails?: None Help needed moving to and from a bed to a chair (including a wheelchair)?: None Help needed standing up from a chair using your arms (e.g., wheelchair or bedside chair)?: None Help needed to walk in hospital room?: A Little Help needed climbing 3-5 steps with a railing? : A Little 6 Click Score: 22    End of Session   Activity Tolerance: Patient tolerated treatment well;Patient limited by fatigue Patient left: in bed;with call bell/phone within reach Nurse Communication: Mobility status PT Visit Diagnosis: Other abnormalities of gait and mobility (R26.89);Muscle weakness (generalized) (M62.81)    Time: 3254-9826 PT Time Calculation (min) (ACUTE ONLY): 17 min   Charges:   PT Evaluation $PT Eval Moderate Complexity: 1 Mod     Mabeline Caras, PT, DPT Acute Rehabilitation Services  Pager [phone] Office [place] 01/02/2019, 1:01 PM

## 2019-01-02 NOTE — Progress Notes (Signed)
Inpatient Diabetes Program Recommendations  AACE/ADA: New Consensus Statement on Inpatient Glycemic Control (2015)  Target Ranges:  Prepandial:   less than 140 mg/dL      Peak postprandial:   less than 180 mg/dL (1-2 hours)      Critically ill patients:  140 - 180 mg/dL   Lab Results  Component Value Date   GLUCAP 84 01/02/2019   HGBA1C 7.2 (H) 12/25/2018    Review of Glycemic Control Results for TRAYON, KRANTZ (MRN [id number]) as of 01/02/2019 10:17  Ref. Range 01/01/2019 09:35 01/01/2019 12:34 01/01/2019 17:07 01/01/2019 22:43 01/02/2019 08:26  Glucose-Capillary Latest Ref Range: 70 - 99 mg/dL 89 80 91 79 84  Outpatient Diabetes medications:  Tresiba 5 units q HS (verified by diabetes coordinator yesterday), Novolog 1-10 units tid with meals  Current orders for Inpatient glycemic control:  Lantus 6 units q HS Novolog sensitive tid with meals and HS Inpatient Diabetes Program Recommendations:    Blood sugars trending<100 mg/dL. Consider reducing Lantus to 4 units daily.   Thanks,  Adah Perl, RN, BC-ADM Inpatient Diabetes Coordinator Pager [phone] (8a-5p)

## 2019-01-02 NOTE — Progress Notes (Signed)
[place],NP placed order for 1 unit of PRBCs to be transfused. Patient needs a repeat Type & Screen. Called the lab and blood bank to see what time someone would be coming by to draw the type and screen due to morning collection. No one I spoke with in either department was able to provide me with a time. Then the phlebotomist came and drew the type and screen at 0610.  At 0710, the blood bank called and informed this RN that the patient's blood was ready to be picked up. This RN passed this information on to Hazel, Therapist, sports on dayshift.

## 2019-01-02 NOTE — Progress Notes (Signed)
Received notification from NT that the patient had a bloody bowel movement.  I helped the patient to the bedside commode earlier this evening and noticed red streaks in the stool.  NT said this last bowel movement was "nothing but blood."    Paged [facility] provider Jeannette Corpus, who ordered a CBC differential with platelet count.  Will continue to monitor.

## 2019-01-02 NOTE — Progress Notes (Signed)
CRITICAL VALUE ALERT  Critical Value:  Hgb 6.6  Date & Time Notied:  01/02/2019 0355  Provider Notified: Kennon Holter, NP at [phone]  Orders Received/Actions taken: awaiting orders

## 2019-01-02 NOTE — Progress Notes (Signed)
PROGRESS NOTE    [name]  [name]:[id number] DOB: 1970/11/14 DOA: 12/24/2018 PCP: [name], NP   Brief Narrative:  Per HPI: Patient is a48 y.o.malewith history of renal transplant, DM-2, HTN, dyslipidemia-who presented with DKA, AKI on CKD stage III.  He was noted to have shortness of breath with nausea, vomiting, and diarrhea on presentation.  Symptomatology has now improved, but patient is having worsening anemia likely related to chronic kidney disease as well as some iron deficiency for which she will receive iron infusion and PRBC transfusion on 5/19.  He is status post renal biopsy on 5/18.  Assessment & Plan:   Principal Problem:   DKA (diabetic ketoacidoses) ([place]) Active Problems:   Anemia in chronic kidney disease   Essential hypertension, benign   Congestive dilated cardiomyopathy (HCC)   Deceased-donor kidney transplant   ARF (acute renal failure) ([place])   Renal transplant recipient   Acute renal failure superimposed on stage 3 chronic kidney disease (HCC)   Prolonged QT interval   Acute diarrhea  DKA: Resolved-on admission required insulin infusion-has been transitioned to subcutaneous insulin.  Blood glucose is currently well controlled.  Severe metabolic acidosis:Felt to be a combination of DKA, AKI. Improved after resolution of DKA and HD.  AKI on CKD stage IV (history of renal transplant on immunosuppressive's):Worsening creatininefelt to beeither secondary to diarrhea/hypovolemia or from progression of underlying CKD. Has undergone HD x2 during this hospital stay, nephrology following-with renal biopsy performed on 5/18 with results pending.  Likely need for hemodialysis in a.m.  Diarrhea:2 days-GI pathogen panel, C. difficile PCR negative. CMV PCR pending.  History of renal transplantation:Remains on maintenance immunosuppression with tacrolimus and Myfortic.  Tacrolimus level is 4.1 and noted to be therapeutic.   Hypokalemia:Better-likely secondary to diarrhea.   This has been repleted yesterday.  Will recheck tomorrow.  Thrombocytopenia-currently stable: Appears to be a chronic issue-has had thrombocytopenia going back to 2019. No evidence of bleeding-follow for now. Platelet count remains to be relatively stable over the past few days.  Likely secondary to some hemoconcentration initially.  Anemia-downtrending: Likely secondary to chronic kidney disease-no evidence of blood loss-hemoglobin has down trended to 6.6 and will repeat PRBC transfusion of 1 unit today continue to follow CBC-in a.m.    He is noted to have some iron deficiency for which I will transfuse 1 unit of Feraheme as well.  Insulin-dependent DM-2:CBGs stable-continue Lantus now 4 units and SSI.  Prolonged QTc interval:Resolved-probably secondary to hypokalemia   DVT prophylaxis: SCDs Code Status: Full Family Communication: None at bedside Disposition Plan:  Plan for repeat HD in a.m.  Nephrology following.  PT evaluation still pending.  Likely discharge in the next 24 to 48 hours.   Consultants:   Nephrology  Procedures:   None  Antimicrobials:   None   Subjective: Patient seen and evaluated today with no new acute complaints or concerns. No acute concerns or events noted overnight.  He is still tolerating his diet without any further nausea, vomiting, or diarrhea.  He tolerated renal biopsy well yesterday with no issues or concerns related to this.  Objective: Vitals:   01/02/19 0527 01/02/19 0533 01/02/19 0754 01/02/19 0756  BP:  117/69 130/80 130/80  Pulse:  83 83 83  Resp:   16 16  Temp:  98.6 F (37 C) 98.2 F (36.8 C) 98.2 F (36.8 C)  TempSrc:  Oral Oral Oral  SpO2:  100% 100%   Weight: 57.8 kg     Height:  Intake/Output Summary (Last 24 hours) at 01/02/2019 1054 Last data filed at 01/01/2019 1802 Gross per 24 hour  Intake -  Output 475 ml  Net -475 ml   Filed Weights    12/31/18 0602 01/01/19 0603 01/02/19 0527  Weight: 58.2 kg 58.1 kg 57.8 kg    Examination:  General exam: Appears calm and comfortable  Respiratory system: Clear to auscultation. Respiratory effort normal. Cardiovascular system: S1 & S2 heard, RRR. No JVD, murmurs, rubs, gallops or clicks. No pedal edema. Gastrointestinal system: Abdomen is nondistended, soft and nontender. No organomegaly or masses felt. Normal bowel sounds heard. Central nervous system: Alert and oriented. No focal neurological deficits. Extremities: Symmetric 5 x 5 power. Skin: No rashes, lesions or ulcers Psychiatry: Judgement and insight appear normal. Mood & affect appropriate.     Data Reviewed: I have personally reviewed following labs and imaging studies  CBC: Recent Labs  Lab 12/29/18 0944 12/30/18 0256 12/31/18 0340 01/01/19 0238 01/02/19 0238  WBC 9.3 7.2 7.3 5.3 5.2  HGB 7.6* 7.2* 7.4* 7.0* 6.6*  HCT 23.4* 22.5* 23.3* 22.1* 20.5*  MCV 80.4 80.4 82.0 82.2 82.7  PLT 70* 74* 82* 82* 94*   Basic Metabolic Panel: Recent Labs  Lab 12/27/18 0732 12/28/18 0329  12/29/18 0342 12/30/18 0256 12/31/18 0340 01/01/19 0238 01/02/19 0238  NA  --  137   < > 135 137 137 136 134*  K  --  2.3*   < > 3.5 3.5 3.4* 3.9 4.1  CL  --  100   < > 104 102 102 104 104  CO2  --  25   < > 20* 25 25 23 23   GLUCOSE  --  92   < > 92 131* 116* 135* 137*  BUN  --  56*   < > 50* 21* 21* 20 20  CREATININE  --  6.63*   < > 6.28* 3.53* 4.04* 4.13* 4.11*  CALCIUM  --  6.3*   < > 6.8* 7.2* 7.5* 7.5* 7.9*  MG 1.5*  --   --  1.7 2.2  --   --   --   PHOS  --  2.1*  --  2.6 2.1* 2.6 3.1  --    < > = values in this interval not displayed.   GFR: Estimated Creatinine Clearance: 17.6 mL/min (A) (by C-G formula based on SCr of 4.11 mg/dL (H)). Liver Function Tests: Recent Labs  Lab 12/28/18 0329 12/29/18 0342 12/30/18 0256 12/31/18 0340 01/01/19 0238  ALBUMIN 2.6* 2.5* 2.6* 2.7* 2.4*   No results for input(s): LIPASE,  AMYLASE in the last 168 hours. No results for input(s): AMMONIA in the last 168 hours. Coagulation Profile: Recent Labs  Lab 12/31/18 0340 01/01/19 0238  INR 1.1 1.1   Cardiac Enzymes: No results for input(s): CKTOTAL, CKMB, CKMBINDEX, TROPONINI in the last 168 hours. BNP (last 3 results) No results for input(s): PROBNP in the last 8760 hours. HbA1C: No results for input(s): HGBA1C in the last 72 hours. CBG: Recent Labs  Lab 01/01/19 0935 01/01/19 1234 01/01/19 1707 01/01/19 2243 01/02/19 0826  GLUCAP 89 80 91 79 84   Lipid Profile: No results for input(s): CHOL, HDL, LDLCALC, TRIG, CHOLHDL, LDLDIRECT in the last 72 hours. Thyroid Function Tests: No results for input(s): TSH, T4TOTAL, FREET4, T3FREE, THYROIDAB in the last 72 hours. Anemia Panel: Recent Labs    01/01/19 1004  TIBC 155*  IRON 26*   Sepsis Labs: No results for input(s): PROCALCITON, LATICACIDVEN in  the last 168 hours.  Recent Results (from the past 240 hour(s))  Culture, blood (single)     Status: None   Collection Time: 12/24/18  6:04 PM  Result Value Ref Range Status   Specimen Description BLOOD SITE NOT SPECIFIED  Final   Special Requests   Final    BOTTLES DRAWN AEROBIC AND ANAEROBIC Blood Culture adequate volume   Culture   Final    NO GROWTH 5 DAYS Performed at [facility], [address], [place], [place] [address]    Report Status 12/29/2018 FINAL  Final  SARS Coronavirus 2 (CEPHEID - Performed in [facility] hospital lab), Hosp Order     Status: None   Collection Time: 12/24/18  6:19 PM  Result Value Ref Range Status   SARS Coronavirus 2 NEGATIVE NEGATIVE Final    Comment: (NOTE) If result is NEGATIVE SARS-CoV-2 target nucleic acids are NOT DETECTED. The SARS-CoV-2 RNA is generally detectable in upper and lower  respiratory specimens during the acute phase of infection. The lowest  concentration of SARS-CoV-2 viral copies this assay can detect is 250  copies / mL. A negative  result does not preclude SARS-CoV-2 infection  and should not be used as the sole basis for treatment or other  patient management decisions.  A negative result may occur with  improper specimen collection / handling, submission of specimen other  than nasopharyngeal swab, presence of viral mutation(s) within the  areas targeted by this assay, and inadequate number of viral copies  (<250 copies / mL). A negative result must be combined with clinical  observations, patient history, and epidemiological information. If result is POSITIVE SARS-CoV-2 target nucleic acids are DETECTED. The SARS-CoV-2 RNA is generally detectable in upper and lower  respiratory specimens dur ing the acute phase of infection.  Positive  results are indicative of active infection with SARS-CoV-2.  Clinical  correlation with patient history and other diagnostic information is  necessary to determine patient infection status.  Positive results do  not rule out bacterial infection or co-infection with other viruses. If result is PRESUMPTIVE POSTIVE SARS-CoV-2 nucleic acids MAY BE PRESENT.   A presumptive positive result was obtained on the submitted specimen  and confirmed on repeat testing.  While 2019 novel coronavirus  (SARS-CoV-2) nucleic acids may be present in the submitted sample  additional confirmatory testing may be necessary for epidemiological  and / or clinical management purposes  to differentiate between  SARS-CoV-2 and other Sarbecovirus currently known to infect humans.  If clinically indicated additional testing with an alternate test  methodology [phone]) is advised. The SARS-CoV-2 RNA is generally  detectable in upper and lower respiratory sp ecimens during the acute  phase of infection. The expected result is Negative. Fact Sheet for Patients:  StrictlyIdeas.no Fact Sheet for Healthcare Providers: BankingDealers.co.za This test is not yet  approved or cleared by the [place] FDA and has been authorized for detection and/or diagnosis of SARS-CoV-2 by FDA under an Emergency Use Authorization (EUA).  This EUA will remain in effect (meaning this test can be used) for the duration of the COVID-19 declaration under Section 564(b)(1) of the Act, 21 U.S.C. section 360bbb-3(b)(1), unless the authorization is terminated or revoked sooner. Performed at [facility], [place] [address], [place], [place] [address]   Stool culture (children & immunocomp patients)     Status: None   Collection Time: 12/25/18  7:20 PM  Result Value Ref Range Status   Salmonella/Shigella Screen Final  report  Final   Campylobacter Culture Final report  Final   E coli, Shiga toxin Assay Negative Negative Final    Comment: (NOTE) Performed At: [facility] [place], [place] [id number] Rush Farmer MD WC:[id number]   C difficile quick scan w PCR reflex     Status: None   Collection Time: 12/25/18  7:20 PM  Result Value Ref Range Status   C Diff antigen NEGATIVE NEGATIVE Final   C Diff toxin NEGATIVE NEGATIVE Final   C Diff interpretation No C. difficile detected.  Final    Comment: Performed at [facility], [place] [address], [place], [place] [address]  STOOL CULTURE REFLEX - RSASHR     Status: None   Collection Time: 12/25/18  7:20 PM  Result Value Ref Range Status   Stool Culture result 1 (RSASHR) Comment  Final    Comment: (NOTE) No Salmonella or Shigella recovered. Performed At: [facility] [address] [place], [place] [id number] Rush Farmer MD QP:[id number]   STOOL CULTURE Reflex - CMPCXR     Status: None   Collection Time: 12/25/18  7:20 PM  Result Value Ref Range Status   Stool Culture result 1 (CMPCXR) Comment  Final    Comment: (NOTE) No Campylobacter species isolated. Performed At: [facility] [place], [place] [id number] Rush Farmer MD PJ:[id number]    Gastrointestinal Panel by PCR , Stool     Status: None   Collection Time: 12/28/18 10:06 AM  Result Value Ref Range Status   Campylobacter species NOT DETECTED NOT DETECTED Final   Plesimonas shigelloides NOT DETECTED NOT DETECTED Final   Salmonella species NOT DETECTED NOT DETECTED Final   Yersinia enterocolitica NOT DETECTED NOT DETECTED Final   Vibrio species NOT DETECTED NOT DETECTED Final   Vibrio cholerae NOT DETECTED NOT DETECTED Final   Enteroaggregative E coli (EAEC) NOT DETECTED NOT DETECTED Final   Enteropathogenic E coli (EPEC) NOT DETECTED NOT DETECTED Final   Enterotoxigenic E coli (ETEC) NOT DETECTED NOT DETECTED Final   Shiga like toxin producing E coli (STEC) NOT DETECTED NOT DETECTED Final   Shigella/Enteroinvasive E coli (EIEC) NOT DETECTED NOT DETECTED Final   Cryptosporidium NOT DETECTED NOT DETECTED Final   Cyclospora cayetanensis NOT DETECTED NOT DETECTED Final   Entamoeba histolytica NOT DETECTED NOT DETECTED Final   Giardia lamblia NOT DETECTED NOT DETECTED Final   Adenovirus F40/41 NOT DETECTED NOT DETECTED Final   Astrovirus NOT DETECTED NOT DETECTED Final   Norovirus GI/GII NOT DETECTED NOT DETECTED Final   Rotavirus A NOT DETECTED NOT DETECTED Final   Sapovirus (I, II, IV, and V) NOT DETECTED NOT DETECTED Final    Comment: Performed at [facility], [address] [place], [place] [address]         Radiology Studies: US Biopsy (kidney)  Result Date: 01/01/2019 INDICATION: 48 year old with history of renal transplant. Acute kidney injury. Request for renal transplant biopsy. EXAM: ULTRASOUND-GUIDED RENAL TRANSPLANT BIOPSY MEDICATIONS: None. ANESTHESIA/SEDATION: Moderate (conscious) sedation was employed during this procedure. A total of Versed 2.0 mg and Fentanyl 50 mcg was administered intravenously. Moderate Sedation Time: 17 minutes. The patient's level of consciousness and vital signs were monitored continuously by radiology nursing  throughout the procedure under my direct supervision. FLUOROSCOPY TIME:  Fluoroscopy Time: None COMPLICATIONS: None immediate. PROCEDURE: Informed written consent was obtained from the patient after a thorough discussion of the procedural risks, benefits and alternatives. All questions were addressed. Maximal Sterile Barrier Technique  was utilized including caps, mask, sterile gowns, sterile gloves, sterile drape, hand hygiene and skin antiseptic. A timeout was performed prior to the initiation of the procedure. Left lower quadrant was evaluated with ultrasound. The transplant kidney was easily identified. The left lower abdomen was prepped with chlorhexidine and sterile field was created. Skin and soft tissues were anesthetized with 1% lidocaine. The lower pole of the kidney was imaged in a transverse dimension. 16 gauge core needle was directed into the lower pole from a medial to lateral approach. Total of 2 core biopsies were obtained and specimens placed in saline. Adequate sized core biopsies were obtained. Bandage placed over the puncture site. FINDINGS: Transplant kidney in the left lower quadrant of the abdomen. Two core biopsies obtained from the lower pole. No evidence for bleeding or hematoma formation. Adequate core biopsies obtained. IMPRESSION: Ultrasound-guided core biopsies of the renal transplant. Electronically Signed   By: Markus Daft M.D.   On: 01/01/2019 11:01        Scheduled Meds: . sodium chloride   Intravenous Once  . chlorhexidine  15 mL Mouth Rinse BID  . darbepoetin (ARANESP) injection - NON-DIALYSIS  200 mcg Subcutaneous Q Mon-1800  . insulin aspart  0-5 Units Subcutaneous QHS  . insulin aspart  0-9 Units Subcutaneous TID WC  . [START ON 01/03/2019] insulin glargine  4 Units Subcutaneous Daily  . mouth rinse  15 mL Mouth Rinse q12n4p  . mycophenolate  720 mg Oral BID  . tacrolimus  6 mg Oral Daily   And  . tacrolimus  5 mg Oral QHS   Continuous Infusions: .  ferumoxytol       LOS: 9 days    Time spent: 30 minutes    Keysha Damewood Darleen Crocker, DO [facility] Pager [phone]  If 7PM-7AM, please contact night-coverage [email or web address] Password [facility] 01/02/2019, 10:54 AM

## 2019-01-02 NOTE — Progress Notes (Signed)
Pt refused his MRSA swab.

## 2019-01-03 ENCOUNTER — Telehealth: Payer: Self-pay | Admitting: *Deleted

## 2019-01-03 DIAGNOSIS — N184 Chronic kidney disease, stage 4 (severe): Secondary | ICD-10-CM

## 2019-01-03 LAB — CBC WITH DIFFERENTIAL/PLATELET
Abs Immature Granulocytes: 0.02 10*3/uL (ref 0.00–0.07)
Abs Immature Granulocytes: 0.02 10*3/uL (ref 0.00–0.07)
Abs Immature Granulocytes: 0.05 10*3/uL (ref 0.00–0.07)
Basophils Absolute: 0 10*3/uL (ref 0.0–0.1)
Basophils Absolute: 0 10*3/uL (ref 0.0–0.1)
Basophils Absolute: 0 10*3/uL (ref 0.0–0.1)
Basophils Relative: 0 %
Basophils Relative: 0 %
Basophils Relative: 0 %
Eosinophils Absolute: 0.3 10*3/uL (ref 0.0–0.5)
Eosinophils Absolute: 0.3 10*3/uL (ref 0.0–0.5)
Eosinophils Absolute: 0.3 10*3/uL (ref 0.0–0.5)
Eosinophils Relative: 4 %
Eosinophils Relative: 6 %
Eosinophils Relative: 6 %
HCT: 26.6 % — ABNORMAL LOW (ref 39.0–52.0)
HCT: 26.7 % — ABNORMAL LOW (ref 39.0–52.0)
HCT: 31.3 % — ABNORMAL LOW (ref 39.0–52.0)
Hemoglobin: 10.2 g/dL — ABNORMAL LOW (ref 13.0–17.0)
Hemoglobin: 8.6 g/dL — ABNORMAL LOW (ref 13.0–17.0)
Hemoglobin: 8.7 g/dL — ABNORMAL LOW (ref 13.0–17.0)
Immature Granulocytes: 0 %
Immature Granulocytes: 0 %
Immature Granulocytes: 1 %
Lymphocytes Relative: 16 %
Lymphocytes Relative: 19 %
Lymphocytes Relative: 22 %
Lymphs Abs: 1 10*3/uL (ref 0.7–4.0)
Lymphs Abs: 1 10*3/uL (ref 0.7–4.0)
Lymphs Abs: 1.2 10*3/uL (ref 0.7–4.0)
MCH: 27.1 pg (ref 26.0–34.0)
MCH: 27.1 pg (ref 26.0–34.0)
MCH: 27.3 pg (ref 26.0–34.0)
MCHC: 32.3 g/dL (ref 30.0–36.0)
MCHC: 32.6 g/dL (ref 30.0–36.0)
MCHC: 32.6 g/dL (ref 30.0–36.0)
MCV: 83.2 fL (ref 80.0–100.0)
MCV: 83.9 fL (ref 80.0–100.0)
MCV: 83.9 fL (ref 80.0–100.0)
Monocytes Absolute: 0.5 10*3/uL (ref 0.1–1.0)
Monocytes Absolute: 0.5 10*3/uL (ref 0.1–1.0)
Monocytes Absolute: 0.5 10*3/uL (ref 0.1–1.0)
Monocytes Relative: 9 %
Monocytes Relative: 9 %
Monocytes Relative: 9 %
Neutro Abs: 3.4 10*3/uL (ref 1.7–7.7)
Neutro Abs: 3.6 10*3/uL (ref 1.7–7.7)
Neutro Abs: 4.3 10*3/uL (ref 1.7–7.7)
Neutrophils Relative %: 63 %
Neutrophils Relative %: 66 %
Neutrophils Relative %: 70 %
Platelets: 100 10*3/uL — ABNORMAL LOW (ref 150–400)
Platelets: 101 10*3/uL — ABNORMAL LOW (ref 150–400)
Platelets: 102 10*3/uL — ABNORMAL LOW (ref 150–400)
RBC: 3.17 MIL/uL — ABNORMAL LOW (ref 4.22–5.81)
RBC: 3.21 MIL/uL — ABNORMAL LOW (ref 4.22–5.81)
RBC: 3.73 MIL/uL — ABNORMAL LOW (ref 4.22–5.81)
RDW: 15.1 % (ref 11.5–15.5)
RDW: 15.1 % (ref 11.5–15.5)
RDW: 15.3 % (ref 11.5–15.5)
WBC: 5.4 10*3/uL (ref 4.0–10.5)
WBC: 5.5 10*3/uL (ref 4.0–10.5)
WBC: 6.1 10*3/uL (ref 4.0–10.5)
nRBC: 0 % (ref 0.0–0.2)
nRBC: 0 % (ref 0.0–0.2)
nRBC: 0 % (ref 0.0–0.2)

## 2019-01-03 LAB — BASIC METABOLIC PANEL
Anion gap: 8 (ref 5–15)
BUN: 20 mg/dL (ref 6–20)
CO2: 23 mmol/L (ref 22–32)
Calcium: 8.4 mg/dL — ABNORMAL LOW (ref 8.9–10.3)
Chloride: 105 mmol/L (ref 98–111)
Creatinine, Ser: 4.12 mg/dL — ABNORMAL HIGH (ref 0.61–1.24)
GFR calc Af Amer: 18 mL/min — ABNORMAL LOW (ref >60)
GFR calc non Af Amer: 16 mL/min — ABNORMAL LOW (ref >60)
Glucose, Bld: 94 mg/dL (ref 70–99)
Potassium: 4.4 mmol/L (ref 3.5–5.1)
Sodium: 136 mmol/L (ref 135–145)

## 2019-01-03 LAB — TYPE AND SCREEN
ABO/RH(D): O POS
Antibody Screen: NEGATIVE
Unit division: 0

## 2019-01-03 LAB — GLUCOSE, CAPILLARY
Glucose-Capillary: 70 mg/dL (ref 70–99)
Glucose-Capillary: 130 mg/dL — ABNORMAL HIGH (ref 70–99)

## 2019-01-03 LAB — BPAM RBC
Blood Product Expiration Date: 202006202359
ISSUE DATE / TIME: 202005190745
Unit Type and Rh: 5100

## 2019-01-03 NOTE — Telephone Encounter (Signed)
I have made the 1st attempt to contact the patient or family member in charge, in order to follow up from recently being discharged from the hospital. I left a message on voicemail but I will make another attempt at a different time.  

## 2019-01-03 NOTE — Progress Notes (Signed)
Patient discharge instruction received and reviewed with patient. Patient Verbalized understanding. IV removed and skin is intact.Patient was MASD to buttock scattered throughout with open pink/red areas. No new prescriptions given.

## 2019-01-03 NOTE — TOC Transition Note (Addendum)
Transition of Care [facility]) - CM/SW Discharge Note   Patient Details  Name: [name] MRN: [id number] Date of Birth: 1971/01/02  [facility] of [facility] CM/SW Contact:  Sharin Mons, RN Phone Number: 01/03/2019, 10:26 AM   Clinical Narrative:  Admitted with DKA/ acute on chronic kidney disease,  hx of ESRD renal disease s/p renal transplantation 2015, diabetes, hyperlipidemia hypertension. From home with with . PTA independent with ADL'S , no DME usage.  Pt to transition to home today,  f/u with PCP within a week. Pt with Rx med concerns . Pt has transportation to home.   Final next level of care: Home/Self Care Barriers to Discharge: Barriers Resolved   Patient Goals and CMS Choice Patient states their goals for this hospitalization and ongoing recovery are:: to go home    Choice offered to / list presented to : NA  Discharge Placement                       Discharge Plan and Services   Discharge Planning Services: NA Post Acute Care Choice: NA          DME Arranged: N/A DME Agency: NA       HH Arranged: NA HH Agency: NA        Social Determinants of Health (SDOH) Interventions     Readmission Risk Interventions No flowsheet data found.

## 2019-01-03 NOTE — Discharge Summary (Signed)
Physician Discharge Summary  [name] KDX:[id number] DOB: 04-Jun-1971 DOA: 12/24/2018  PCP: [name], NP  Admit date: 12/24/2018 Discharge date: 01/03/2019  Admitted From: Home Disposition: Home  Recommendations for Outpatient Follow-up:  1. Follow up with PCP in 1 week with repeat CBC/BMP 2. Outpatient follow-up with nephrology/transplant nephrology 3. Follow up in ED if symptoms worsen or new appear 4. Please refer patient to gastroenterology as an outpatient   Home Health: No Equipment/Devices: None  Discharge Condition: Stable CODE STATUS: Full Diet recommendation: Heart healthy/carb modified/renal diet  Brief/Interim Summary: 48 year old male with history of renal transplant, diabetes mellitus type 2, hypertension, dyslipidemia presented with shortness of breath along with nausea, vomiting and diarrhea and was found to have DKA along with AKI on CKD stage IV.  Nephrology was consulted.  His condition has improved.  His DKA has resolved.  He was found to have worsening anemia for which he received packed red cell transfusion.  He also underwent renal biopsy on 01/01/2019.  His creatinine has remained stable.  He required hemodialysis x2 during the hospitalization.  He feels better.  Nephrology has cleared the patient for discharge.  Outpatient follow-up with nephrology.  Discharge Diagnoses:  Principal Problem:   DKA (diabetic ketoacidoses) ([place]) Active Problems:   Anemia in chronic kidney disease   Essential hypertension, benign   Congestive dilated cardiomyopathy (HCC)   Deceased-donor kidney transplant   ARF (acute renal failure) ([place])   Renal transplant recipient   Acute renal failure superimposed on stage 3 chronic kidney disease (HCC)   Prolonged QT interval   Acute diarrhea  DKA in a patient with diabetes mellitus type 2 -Present on admission.  Required insulin drip. -Subsequently transitioned to long-acting insulin.  Blood sugars on the lower side.   Outpatient follow-up with PCP.  Continue carb modified diet.  Continue home regimen  Acute kidney injury on chronic kidney disease stage IV in a patient with history of renal transplant -Nephrology evaluation and follow-up appreciated. -Patient underwent hemodialysis x2 during this hospital stay. -He underwent renal biopsy on 01/01/2019 and as per Dr. Crisoforo Oxford, pathology showed chronic inflammatory changes and no signs of rejection.  Creatinine 4.12 today.  No need for any more dialysis at this time.  Spoke to Dr. Justin Mend on phone and he states that patient can be discharged home from nephrology point of view and he will arrange for outpatient follow-up.  Patient will need repeat BMP as an outpatient. -Continue tacrolimus and Myfortic.  Outpatient follow-up with transplant nephrology  Diarrhea -Questionable cause.  Improving.  GI panel and C. difficile PCR negative  Chronic anemia likely secondary to chronic kidney disease -Patient received 1 unit of packed red cells transfusion during the hospitalization along with IV iron.  Hemoglobin is 8.6 today.  He had very minimal rectal bleeding overnight but since then it has stopped.  He is aware that he needs to have outpatient GI evaluation.  This can be done by his PCP.  Thrombocytopenia -Questionable cause.  Platelets stable.  Outpatient follow-up   Discharge Instructions  Discharge Instructions    Call MD for:  difficulty breathing, headache or visual disturbances   Complete by:  As directed    Call MD for:  extreme fatigue   Complete by:  As directed    Call MD for:  persistant dizziness or light-headedness   Complete by:  As directed    Diet - low sodium heart healthy   Complete by:  As directed    Diet Carb  Modified   Complete by:  As directed    Increase activity slowly   Complete by:  As directed      Allergies as of 01/03/2019      Reactions   Lactose Intolerance (gi) Diarrhea   No whole milk!!   Other Other (See  Comments)   Soy milk burns his tongue   Pollen Extract Other (See Comments)   Itching, watery eyes and sneezing.   Tape Rash   PLEASE USE COBAN WRAP OR PAPER TAPE       Medication List    STOP taking these medications   ketoconazole 2 % shampoo Commonly known as:  NIZORAL   sodium bicarbonate 650 MG tablet     TAKE these medications   aspirin EC 81 MG tablet Take 81 mg by mouth daily.   atorvastatin 20 MG tablet Commonly known as:  LIPITOR TAKE 1 TABLET BY MOUTH AT BEDTIME FOR CHOLESTEROL   carvedilol 6.25 MG tablet Commonly known as:  COREG Take 1 tablet (6.25 mg total) by mouth 2 (two) times daily with a meal.   dapsone 25 MG tablet Take 50 mg by mouth daily.   Dexcom G6 Sensor Misc 1 each by Does not apply route 3 (three) times daily. Use to test blood sugar three times daily. Dx: E11.9   Dexcom G6 Transmitter Misc 1 each by Does not apply route 3 (three) times daily. Use to test blood sugar three times daily. Dx: E11.9   Dexcom Receiver Kit Devi 1 each by Does not apply route 3 (three) times daily. Use to test blood sugar three times daily. Dx: R44.3   folic acid 1 MG tablet Commonly known as:  FOLVITE Take 1 mg by mouth daily.   Insulin Pen Needle 31G X 5 MM Misc 5 Units/oz/day by Does not apply route at bedtime. Use 4 times daily to inject insulin to skin   Magnesium 400 MG Caps Take 1,600 mg by mouth 2 (two) times daily.   mycophenolate 180 MG EC tablet Commonly known as:  MYFORTIC Take 720 mg by mouth 2 (two) times daily.   NovoLOG FlexPen 100 UNIT/ML FlexPen Generic drug:  insulin aspart INJECT 12 UNITS INTO THE SKIN 3 TIMES DAILY BEFORE MEALS. What changed:  See the new instructions.   omeprazole 20 MG capsule Commonly known as:  PRILOSEC Take 1 capsule (20 mg total) by mouth daily.   tacrolimus 1 MG capsule Commonly known as:  PROGRAF Take 5-6 mg by mouth See admin instructions. 6 mg in the morning and 5 mg in the evening - 1000, 2200    Tresiba FlexTouch 100 UNIT/ML Sopn FlexTouch Pen Generic drug:  insulin degludec Inject 5 Units into the skin at bedtime.      Follow-up Information    [name], NP. Schedule an appointment as soon as possible for a visit in 1 week(s).   Specialty:  Geriatric Medicine Why:  With repeat CBC/BMP Contact information: [place]. [place] [address] [phone]        [name], MD. Schedule an appointment as soon as possible for a visit in 1 week(s).   Specialty:  Nephrology Contact information: [place] [address] [phone]          Allergies  Allergen Reactions  . Lactose Intolerance (Gi) Diarrhea    No whole milk!!  . Other Other (See Comments)    Soy milk burns his tongue  . Pollen Extract Other (See Comments)  Itching, watery eyes and sneezing.  . Tape Rash    PLEASE USE COBAN WRAP OR PAPER TAPE     Consultations:  Nephrology   Procedures/Studies: Ir US Guide Vasc Access Left  Result Date: 12/28/2018 INDICATION: History of end-stage renal disease with recently failed renal transplant. Patient previously received dialysis via a left forearm graft which was most recently intervened on 12/22/2012. The left forearm dialysis graft has not been in use for the past 6 years however they were able to provide one session of dialysis via the graft earlier this week however unfortunately the graft has subsequently thrombosed. As such, patient presents now for attempted image guided thrombolysis of the chronic left forearm dialysis graft. EXAM: 1. FISTULALYSIS 2. ANGIOPLASTY OF VENOUS LIMB AND VENOUS ANASTOMOSIS 3. ULTRASOUND GUIDANCE FOR VENOUS ACCESS COMPARISON:  Left forearm graft declot-12/22/2012. MEDICATIONS: Heparin 3000 units IV; TPA 4 mg into graft. CONTRAST:  50 cc Isovue-300 ANESTHESIA/SEDATION: Moderate (conscious) sedation was employed during this procedure. A total of Versed 1.5 mg and Fentanyl 75 mcg was administered  intravenously. Moderate Sedation Time: 56 minutes. The patient's level of consciousness and vital signs were monitored continuously by radiology nursing throughout the procedure under my direct supervision. FLUOROSCOPY TIME:  10 minutes, 24 seconds (11 mGy) COMPLICATIONS: None immediate. TECHNIQUE: Informed written consent was obtained from the patient after a discussion of the risks, benefits and alternatives to treatment. Questions regarding the procedure were encouraged and answered. A timeout was performed prior to the initiation of the procedure. On physical examination, the existing left forearm arm dialysis graft was negative for palpable pulse or thrill. The skin overlying the graft was prepped and draped in the usual sterile fashion, and a sterile drape was applied covering the operative field. Maximum barrier sterile technique with sterile gowns and gloves were used for the procedure. Under ultrasound guidance, the dialysis graft was accessed directed towards the venous anastomosis with a micropuncture kit after the overlying soft tissues were anesthetized with 1% lidocaine. An ultrasound image was saved for documentation purposes. The micropuncture sheath was exchange for a 7-French vascular sheath over a guidewire. Over a Benson wire, a Kumpe catheter was advanced centrally and a central venogram was performed. Pull back venogram was performed with the Kumpe catheter. Heparin was administered systemically and TPA was administered via the Kumpe catheter throughout near the entirety of the venous limb. The venous anastomosis and majority of the venous limb was angioplastied with a 6 mm x 4 cm Conquest balloon. An additional access was obtained directed towards the arterial anastomoses with a micropuncture kit after the overlying soft tissues anesthetized with 1% lidocaine. This allowed for placement of a 6-French vascular sheath. The graft was thrombectomized with several rounds of push-pull mechanical  thrombectomy with an occlusion balloon. Flow was restored to the graft as evidenced by restored pulsatility of the graft and brisk blood return from the side arm of the vascular sheath. Shuntograms were performed. Images were reviewed and the procedure was terminated. At this point, the procedure was terminated. All wires, catheters and sheaths were removed from the patient. Hemostasis was achieved at both access sites with deployment of a swizzle sutures which will be removed at the patient's next dialysis session. Dressings were placed. The patient tolerated the procedure without immediate postprocedural complication. FINDINGS: The existing left forearm AV graft is thrombosed. The venous anastomosis and near the entirety of the venous limb was angioplastied to 6 mm diameter. The graft was successfully thrombectomized using mechanical and pharmacologic means  as above. Completion shuntogram demonstrates the venous limb and anastomosis are widely patent. Small area of extravasation was noted at recent dialysis graft cannulation site however this was successfully treated with manual compression following the completion of procedure. The central venous system, arterial limb and arterial anastomosis are widely patent. IMPRESSION: 1. Technically successful left forearm AV graft thrombolysis. 2. Successful angioplasty of the venous anastomosis and majority of the venous limb to 6 mm diameter. Completion shuntogram demonstrates the venous limb and anastomosis are widely patent. 3. The arterial anastomosis and arterial limb are widely patent. 4. The central venous system is widely patent. ACCESS: This graft would be amenable to future percutaneous intervention as clinically indicated. Electronically Signed   By: Sandi Mariscal M.D.   On: 12/28/2018 16:46   Dg Chest Port 1 View  Result Date: 12/24/2018 CLINICAL DATA:  Tachypnea EXAM: PORTABLE CHEST 1 VIEW COMPARISON:  01/18/2018 FINDINGS: The heart size and mediastinal  contours are within normal limits. Both lungs are clear. The visualized skeletal structures are unremarkable. IMPRESSION: No acute abnormality of the lungs in AP portable projection. Electronically Signed   By: Eddie Candle M.D.   On: 12/24/2018 18:41   Ir Thrombectomy Av Fistula W/thrombolysis/pta Inc/shunt/img Left  Result Date: 12/28/2018 INDICATION: History of end-stage renal disease with recently failed renal transplant. Patient previously received dialysis via a left forearm graft which was most recently intervened on 12/22/2012. The left forearm dialysis graft has not been in use for the past 6 years however they were able to provide one session of dialysis via the graft earlier this week however unfortunately the graft has subsequently thrombosed. As such, patient presents now for attempted image guided thrombolysis of the chronic left forearm dialysis graft. EXAM: 1. FISTULALYSIS 2. ANGIOPLASTY OF VENOUS LIMB AND VENOUS ANASTOMOSIS 3. ULTRASOUND GUIDANCE FOR VENOUS ACCESS COMPARISON:  Left forearm graft declot-12/22/2012. MEDICATIONS: Heparin 3000 units IV; TPA 4 mg into graft. CONTRAST:  50 cc Isovue-300 ANESTHESIA/SEDATION: Moderate (conscious) sedation was employed during this procedure. A total of Versed 1.5 mg and Fentanyl 75 mcg was administered intravenously. Moderate Sedation Time: 56 minutes. The patient's level of consciousness and vital signs were monitored continuously by radiology nursing throughout the procedure under my direct supervision. FLUOROSCOPY TIME:  10 minutes, 24 seconds (11 mGy) COMPLICATIONS: None immediate. TECHNIQUE: Informed written consent was obtained from the patient after a discussion of the risks, benefits and alternatives to treatment. Questions regarding the procedure were encouraged and answered. A timeout was performed prior to the initiation of the procedure. On physical examination, the existing left forearm arm dialysis graft was negative for palpable pulse or  thrill. The skin overlying the graft was prepped and draped in the usual sterile fashion, and a sterile drape was applied covering the operative field. Maximum barrier sterile technique with sterile gowns and gloves were used for the procedure. Under ultrasound guidance, the dialysis graft was accessed directed towards the venous anastomosis with a micropuncture kit after the overlying soft tissues were anesthetized with 1% lidocaine. An ultrasound image was saved for documentation purposes. The micropuncture sheath was exchange for a 7-French vascular sheath over a guidewire. Over a Benson wire, a Kumpe catheter was advanced centrally and a central venogram was performed. Pull back venogram was performed with the Kumpe catheter. Heparin was administered systemically and TPA was administered via the Kumpe catheter throughout near the entirety of the venous limb. The venous anastomosis and majority of the venous limb was angioplastied with a 6 mm x 4  cm Conquest balloon. An additional access was obtained directed towards the arterial anastomoses with a micropuncture kit after the overlying soft tissues anesthetized with 1% lidocaine. This allowed for placement of a 6-French vascular sheath. The graft was thrombectomized with several rounds of push-pull mechanical thrombectomy with an occlusion balloon. Flow was restored to the graft as evidenced by restored pulsatility of the graft and brisk blood return from the side arm of the vascular sheath. Shuntograms were performed. Images were reviewed and the procedure was terminated. At this point, the procedure was terminated. All wires, catheters and sheaths were removed from the patient. Hemostasis was achieved at both access sites with deployment of a swizzle sutures which will be removed at the patient's next dialysis session. Dressings were placed. The patient tolerated the procedure without immediate postprocedural complication. FINDINGS: The existing left forearm AV  graft is thrombosed. The venous anastomosis and near the entirety of the venous limb was angioplastied to 6 mm diameter. The graft was successfully thrombectomized using mechanical and pharmacologic means as above. Completion shuntogram demonstrates the venous limb and anastomosis are widely patent. Small area of extravasation was noted at recent dialysis graft cannulation site however this was successfully treated with manual compression following the completion of procedure. The central venous system, arterial limb and arterial anastomosis are widely patent. IMPRESSION: 1. Technically successful left forearm AV graft thrombolysis. 2. Successful angioplasty of the venous anastomosis and majority of the venous limb to 6 mm diameter. Completion shuntogram demonstrates the venous limb and anastomosis are widely patent. 3. The arterial anastomosis and arterial limb are widely patent. 4. The central venous system is widely patent. ACCESS: This graft would be amenable to future percutaneous intervention as clinically indicated. Electronically Signed   By: Sandi Mariscal M.D.   On: 12/28/2018 16:46   Ct Renal Stone Study  Result Date: 12/25/2018 CLINICAL DATA:  Flank pain, history of renal transplant EXAM: CT ABDOMEN AND PELVIS WITHOUT CONTRAST TECHNIQUE: Multidetector CT imaging of the abdomen and pelvis was performed following the standard protocol without IV contrast. COMPARISON:  None. FINDINGS: Examination of the abdomen pelvis is generally limited by motion artifact throughout. Lower chest: No acute abnormality. Hepatobiliary: No focal liver abnormality is seen. The gallbladder is mildly distended and may contain dependent sludge; evaluation is particularly limited by breath motion artifact through this level. Pancreas: Unremarkable. No pancreatic ductal dilatation or surrounding inflammatory changes. Spleen: Normal in size without focal abnormality. Adrenals/Urinary Tract: Adrenal glands are unremarkable. Atrophic  native kidneys are present bilaterally with small bilateral nonobstructive calculi and/or medullary calcifications. No hydronephrosis or ureteral calculus. 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 Bladder is unremarkable. Stomach/Bowel: Stomach is within normal limits. Appendix appears normal. The colon is fluid-filled to the rectum. Vascular/Lymphatic: No significant vascular findings are present. No enlarged abdominal or pelvic lymph nodes. Reproductive: No mass or other abnormality. Other: No abdominal wall hernia or abnormality. No abdominopelvic ascites. Musculoskeletal: No acute or significant osseous findings. IMPRESSION: 1. Examination of the abdomen and pelvis is generally limited by motion artifact throughout. Within this limitation, no definite non-contrast CT findings to explain lower abdominal pain. 2. The gallbladder is mildly distended and may contain dependent sludge; evaluation is particularly limited by breath motion artifact through this level. 3. 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 No hydronephrosis or ureteral calculus. There is a left lower quadrant transplant allograft without evidence of urinary tract calculus  or hydronephrosis. 4. The colon is fluid-filled to the rectum, in keeping with diarrheal illness. Electronically Signed   By: Eddie Candle M.D.   On: 12/25/2018 08:43   US Biopsy (kidney)  Result Date: 01/01/2019 INDICATION: 48 year old with history of renal transplant. Acute kidney injury. Request for renal transplant biopsy. EXAM: ULTRASOUND-GUIDED RENAL TRANSPLANT BIOPSY MEDICATIONS: None. ANESTHESIA/SEDATION: Moderate (conscious) sedation was employed during this procedure. A total of Versed 2.0 mg and Fentanyl 50 mcg was administered intravenously. Moderate Sedation Time: 17 minutes. The patient's level of  consciousness and vital signs were monitored continuously by radiology nursing throughout the procedure under my direct supervision. FLUOROSCOPY TIME:  Fluoroscopy Time: None COMPLICATIONS: None immediate. PROCEDURE: Informed written consent was obtained from the patient after a thorough discussion of the procedural risks, benefits and alternatives. All questions were addressed. Maximal Sterile Barrier Technique was utilized including caps, mask, sterile gowns, sterile gloves, sterile drape, hand hygiene and skin antiseptic. A timeout was performed prior to the initiation of the procedure. Left lower quadrant was evaluated with ultrasound. The transplant kidney was easily identified. The left lower abdomen was prepped with chlorhexidine and sterile field was created. Skin and soft tissues were anesthetized with 1% lidocaine. The lower pole of the kidney was imaged in a transverse dimension. 16 gauge core needle was directed into the lower pole from a medial to lateral approach. Total of 2 core biopsies were obtained and specimens placed in saline. Adequate sized core biopsies were obtained. Bandage placed over the puncture site. FINDINGS: Transplant kidney in the left lower quadrant of the abdomen. Two core biopsies obtained from the lower pole. No evidence for bleeding or hematoma formation. Adequate core biopsies obtained. IMPRESSION: Ultrasound-guided core biopsies of the renal transplant. Electronically Signed   By: Markus Daft M.D.   On: 01/01/2019 11:01       Subjective: Patient seen and examined at bedside.  He had very minimal rectal bleeding overnight.  None this morning.  He feels better and wants to go home.  No overnight fever, vomiting or abdominal pain.  Discharge Exam: Vitals:   01/02/19 2139 01/03/19 0544  BP: 121/64 135/81  Pulse: 80 82  Resp: 16 16  Temp: 98.1 F (36.7 C) 98.7 F (37.1 C)  SpO2: 100% 100%    General: Pt is alert, awake, not in acute distress Cardiovascular:  rate controlled, S1/S2 + Respiratory: bilateral decreased breath sounds at bases Abdominal: Soft, NT, ND, bowel sounds + Extremities: Trace lower extremity edema, no cyanosis    The results of significant diagnostics from this hospitalization (including imaging, microbiology, ancillary and laboratory) are listed below for reference.     Microbiology: Recent Results (from the past 240 hour(s))  Culture, blood (single)     Status: None   Collection Time: 12/24/18  6:04 PM  Result Value Ref Range Status   Specimen Description BLOOD SITE NOT SPECIFIED  Final   Special Requests   Final    BOTTLES DRAWN AEROBIC AND ANAEROBIC Blood Culture adequate volume   Culture   Final    NO GROWTH 5 DAYS Performed at [facility], [address], [place], [place] [address]    Report Status 12/29/2018 FINAL  Final  SARS Coronavirus 2 (CEPHEID - Performed in [facility] lab), Hosp Order     Status: None   Collection Time: 12/24/18  6:19 PM  Result Value Ref Range Status   SARS Coronavirus 2 NEGATIVE NEGATIVE Final    Comment: (NOTE) If result  is NEGATIVE SARS-CoV-2 target nucleic acids are NOT DETECTED. The SARS-CoV-2 RNA is generally detectable in upper and lower  respiratory specimens during the acute phase of infection. The lowest  concentration of SARS-CoV-2 viral copies this assay can detect is 250  copies / mL. A negative result does not preclude SARS-CoV-2 infection  and should not be used as the sole basis for treatment or other  patient management decisions.  A negative result may occur with  improper specimen collection / handling, submission of specimen other  than nasopharyngeal swab, presence of viral mutation(s) within the  areas targeted by this assay, and inadequate number of viral copies  (<250 copies / mL). A negative result must be combined with clinical  observations, patient history, and epidemiological information. If result is POSITIVE SARS-CoV-2 target  nucleic acids are DETECTED. The SARS-CoV-2 RNA is generally detectable in upper and lower  respiratory specimens dur ing the acute phase of infection.  Positive  results are indicative of active infection with SARS-CoV-2.  Clinical  correlation with patient history and other diagnostic information is  necessary to determine patient infection status.  Positive results do  not rule out bacterial infection or co-infection with other viruses. If result is PRESUMPTIVE POSTIVE SARS-CoV-2 nucleic acids MAY BE PRESENT.   A presumptive positive result was obtained on the submitted specimen  and confirmed on repeat testing.  While 2019 novel coronavirus  (SARS-CoV-2) nucleic acids may be present in the submitted sample  additional confirmatory testing may be necessary for epidemiological  and / or clinical management purposes  to differentiate between  SARS-CoV-2 and other Sarbecovirus currently known to infect humans.  If clinically indicated additional testing with an alternate test  methodology [phone] is advised. The SARS-CoV-2 RNA is generally  detectable in upper and lower respiratory sp ecimens during the acute  phase of infection. The expected result is Negative. Fact Sheet for Patients:  StrictlyIdeas.no Fact Sheet for Healthcare Providers: BankingDealers.co.za This test is not yet approved or cleared by the [place] FDA and has been authorized for detection and/or diagnosis of SARS-CoV-2 by FDA under an Emergency Use Authorization (EUA).  This EUA will remain in effect (meaning this test can be used) for the duration of the COVID-19 declaration under Section 564(b)(1) of the Act, 21 U.S.C. section 360bbb-3(b)(1), unless the authorization is terminated or revoked sooner. Performed at [facility], [place] [address] [place], [place] [address]   Stool culture (children & immunocomp patients)     Status: None   Collection Time:  12/25/18  7:20 PM  Result Value Ref Range Status   Salmonella/Shigella Screen Final report  Final   Campylobacter Culture Final report  Final   E coli, Shiga toxin Assay Negative Negative Final    Comment: (NOTE) Performed At: [facility] [place], [place] [id number] Rush Farmer MD GE:[id number]   C difficile quick scan w PCR reflex     Status: None   Collection Time: 12/25/18  7:20 PM  Result Value Ref Range Status   C Diff antigen NEGATIVE NEGATIVE Final   C Diff toxin NEGATIVE NEGATIVE Final   C Diff interpretation No C. difficile detected.  Final    Comment: Performed at [facility], [place] [address] [place], [place] [address]  STOOL CULTURE REFLEX - RSASHR     Status: None   Collection Time: 12/25/18  7:20 PM  Result Value Ref Range Status   Stool Culture result 1 (RSASHR) Comment  Final    Comment: (NOTE) No Salmonella or Shigella recovered. Performed At: [facility] [address] [place], [place] [id number] Rush Farmer MD WF:[id number]   STOOL CULTURE Reflex - CMPCXR     Status: None   Collection Time: 12/25/18  7:20 PM  Result Value Ref Range Status   Stool Culture result 1 (CMPCXR) Comment  Final    Comment: (NOTE) No Campylobacter species isolated. Performed At: [facility] [place], [place] [id number] Rush Farmer MD CB:[id number]   Gastrointestinal Panel by PCR , Stool     Status: None   Collection Time: 12/28/18 10:06 AM  Result Value Ref Range Status   Campylobacter species NOT DETECTED NOT DETECTED Final   Plesimonas shigelloides NOT DETECTED NOT DETECTED Final   Salmonella species NOT DETECTED NOT DETECTED Final   Yersinia enterocolitica NOT DETECTED NOT DETECTED Final   Vibrio species NOT DETECTED NOT DETECTED Final   Vibrio cholerae NOT DETECTED NOT DETECTED Final   Enteroaggregative E coli (EAEC) NOT DETECTED NOT DETECTED Final   Enteropathogenic E coli (EPEC) NOT DETECTED NOT  DETECTED Final   Enterotoxigenic E coli (ETEC) NOT DETECTED NOT DETECTED Final   Shiga like toxin producing E coli (STEC) NOT DETECTED NOT DETECTED Final   Shigella/Enteroinvasive E coli (EIEC) NOT DETECTED NOT DETECTED Final   Cryptosporidium NOT DETECTED NOT DETECTED Final   Cyclospora cayetanensis NOT DETECTED NOT DETECTED Final   Entamoeba histolytica NOT DETECTED NOT DETECTED Final   Giardia lamblia NOT DETECTED NOT DETECTED Final   Adenovirus F40/41 NOT DETECTED NOT DETECTED Final   Astrovirus NOT DETECTED NOT DETECTED Final   Norovirus GI/GII NOT DETECTED NOT DETECTED Final   Rotavirus A NOT DETECTED NOT DETECTED Final   Sapovirus (I, II, IV, and V) NOT DETECTED NOT DETECTED Final    Comment: Performed at [facility], [place] [place], [place] [address]     Labs: BNP (last 3 results) No results for input(s): BNP in the last 8760 hours. Basic Metabolic Panel: Recent Labs  Lab 12/28/18 0329  12/29/18 0342 12/30/18 0256 12/31/18 0340 01/01/19 0238 01/02/19 0238 01/03/19 0533  NA 137   < > 135 137 137 136 134* 136  K 2.3*   < > 3.5 3.5 3.4* 3.9 4.1 4.4  CL 100   < > 104 102 102 104 104 105  CO2 25   < > 20* _0 GLUCOSE 92   < > 92 131* 116* 135* 137* 94  BUN 56*   < > 50* 21* 21* _1 CREATININE 6.63*   < > 6.28* 3.53* 4.04* 4.13* 4.11* 4.12*  CALCIUM 6.3*   < > 6.8* 7.2* 7.5* 7.5* 7.9* 8.4*  MG  --   --  1.7 2.2  --   --   --   --   PHOS 2.1*  --  2.6 2.1* 2.6 3.1  --   --    < > = values in this interval not displayed.   Liver Function Tests: Recent Labs  Lab 12/28/18 0329 12/29/18 0342 12/30/18 0256 12/31/18 0340 01/01/19 0238  ALBUMIN 2.6* 2.5* 2.6* 2.7* 2.4*   No results for input(s): LIPASE, AMYLASE in the last 168 hours. No results for input(s): AMMONIA in the last 168 hours. CBC: Recent Labs  Lab 12/31/18 0340 01/01/19 0238 01/02/19 0238 01/02/19 1342 01/02/19 2345 01/03/19 0533  WBC 7.3 5.3 5.2  --  6.1 5.4   NEUTROABS  --   --   --   --  4.3 3.4  HGB 7.4* 7.0* 6.6* 8.5* 10.2* 8.6*  HCT 23.3* 22.1* 20.5* 26.0* 31.3* 26.6*  MCV 82.0 82.2 82.7  --  83.9 83.9  PLT 82* 82* 94*  --  101* 100*   Cardiac Enzymes: No results for input(s): CKTOTAL, CKMB, CKMBINDEX, TROPONINI in the last 168 hours. BNP: Invalid input(s): POCBNP CBG: Recent Labs  Lab 01/02/19 0826 01/02/19 1206 01/02/19 1808 01/02/19 2136 01/03/19 0808  GLUCAP 84 113* 101* 120* 70   D-Dimer No results for input(s): DDIMER in the last 72 hours. Hgb A1c No results for input(s): HGBA1C in the last 72 hours. Lipid Profile No results for input(s): CHOL, HDL, LDLCALC, TRIG, CHOLHDL, LDLDIRECT in the last 72 hours. Thyroid function studies No results for input(s): TSH, T4TOTAL, T3FREE, THYROIDAB in the last 72 hours.  Invalid input(s): FREET3 Anemia work up Recent Labs    01/01/19 1004  TIBC 155*  IRON 26*   Urinalysis    Component Value Date/Time   COLORURINE YELLOW 12/25/2018 0200   APPEARANCEUR CLOUDY (A) 12/25/2018 0200   LABSPEC 1.012 12/25/2018 0200   PHURINE 5.0 12/25/2018 0200   GLUCOSEU NEGATIVE 12/25/2018 0200   HGBUR MODERATE (A) 12/25/2018 0200   BILIRUBINUR NEGATIVE 12/25/2018 0200   KETONESUR NEGATIVE 12/25/2018 0200   PROTEINUR 30 (A) 12/25/2018 0200   UROBILINOGEN 0.2 12/23/2008 1845   NITRITE NEGATIVE 12/25/2018 0200   LEUKOCYTESUR NEGATIVE 12/25/2018 0200   Sepsis Labs Invalid input(s): PROCALCITONIN,  WBC,  LACTICIDVEN Microbiology Recent Results (from the past 240 hour(s))  Culture, blood (single)     Status: None   Collection Time: 12/24/18  6:04 PM  Result Value Ref Range Status   Specimen Description BLOOD SITE NOT SPECIFIED  Final   Special Requests   Final    BOTTLES DRAWN AEROBIC AND ANAEROBIC Blood Culture adequate volume   Culture   Final    NO GROWTH 5 DAYS Performed at [facility], [address], [place], [place] [address]    Report Status 12/29/2018 FINAL  Final   SARS Coronavirus 2 (CEPHEID - Performed in [place] hospital lab), Hosp Order     Status: None   Collection Time: 12/24/18  6:19 PM  Result Value Ref Range Status   SARS Coronavirus 2 NEGATIVE NEGATIVE Final    Comment: (NOTE) If result is NEGATIVE SARS-CoV-2 target nucleic acids are NOT DETECTED. The SARS-CoV-2 RNA is generally detectable in upper and lower  respiratory specimens during the acute phase of infection. The lowest  concentration of SARS-CoV-2 viral copies this assay can detect is 250  copies / mL. A negative result does not preclude SARS-CoV-2 infection  and should not be used as the sole basis for treatment or other  patient management decisions.  A negative result may occur with  improper specimen collection / handling, submission of specimen other  than nasopharyngeal swab, presence of viral mutation(s) within the  areas targeted by this assay, and inadequate number of viral copies  (<250 copies / mL). A negative result must be combined with clinical  observations, patient history, and epidemiological information. If result is POSITIVE SARS-CoV-2 target nucleic acids are DETECTED. The SARS-CoV-2 RNA is generally detectable in upper and lower  respiratory specimens dur ing the acute phase of infection.  Positive  results are indicative of active infection with SARS-CoV-2.  Clinical  correlation with patient history and other diagnostic information is  necessary to determine patient infection status.  Positive results do  not rule out bacterial  infection or co-infection with other viruses. If result is PRESUMPTIVE POSTIVE SARS-CoV-2 nucleic acids MAY BE PRESENT.   A presumptive positive result was obtained on the submitted specimen  and confirmed on repeat testing.  While 2019 novel coronavirus  (SARS-CoV-2) nucleic acids may be present in the submitted sample  additional confirmatory testing may be necessary for epidemiological  and / or clinical management  purposes  to differentiate between  SARS-CoV-2 and other Sarbecovirus currently known to infect humans.  If clinically indicated additional testing with an alternate test  methodology [phone]) is advised. The SARS-CoV-2 RNA is generally  detectable in upper and lower respiratory sp ecimens during the acute  phase of infection. The expected result is Negative. Fact Sheet for Patients:  StrictlyIdeas.no Fact Sheet for Healthcare Providers: BankingDealers.co.za This test is not yet approved or cleared by the [place] FDA and has been authorized for detection and/or diagnosis of SARS-CoV-2 by FDA under an Emergency Use Authorization (EUA).  This EUA will remain in effect (meaning this test can be used) for the duration of the COVID-19 declaration under Section 564(b)(1) of the Act, 21 U.S.C. section 360bbb-3(b)(1), unless the authorization is terminated or revoked sooner. Performed at [facility], [place] [address], [place], [place] [address]   Stool culture (children & immunocomp patients)     Status: None   Collection Time: 12/25/18  7:20 PM  Result Value Ref Range Status   Salmonella/Shigella Screen Final report  Final   Campylobacter Culture Final report  Final   E coli, Shiga toxin Assay Negative Negative Final    Comment: (NOTE) Performed At: [facility] [place], [place] [id number] Rush Farmer MD UD:[id number]   C difficile quick scan w PCR reflex     Status: None   Collection Time: 12/25/18  7:20 PM  Result Value Ref Range Status   C Diff antigen NEGATIVE NEGATIVE Final   C Diff toxin NEGATIVE NEGATIVE Final   C Diff interpretation No C. difficile detected.  Final    Comment: Performed at [facility], [place] [address] [place], [place] [address]  STOOL CULTURE REFLEX - RSASHR     Status: None   Collection Time: 12/25/18  7:20 PM  Result Value Ref Range Status   Stool Culture result 1  (RSASHR) Comment  Final    Comment: (NOTE) No Salmonella or Shigella recovered. Performed At: [facility] [address] [place], [place] [id number] Rush Farmer MD PH:[id number]   STOOL CULTURE Reflex - CMPCXR     Status: None   Collection Time: 12/25/18  7:20 PM  Result Value Ref Range Status   Stool Culture result 1 (CMPCXR) Comment  Final    Comment: (NOTE) No Campylobacter species isolated. Performed At: [facility] [place], [place] [id number] Rush Farmer MD ZJ:[id number]   Gastrointestinal Panel by PCR , Stool     Status: None   Collection Time: 12/28/18 10:06 AM  Result Value Ref Range Status   Campylobacter species NOT DETECTED NOT DETECTED Final   Plesimonas shigelloides NOT DETECTED NOT DETECTED Final   Salmonella species NOT DETECTED NOT DETECTED Final   Yersinia enterocolitica NOT DETECTED NOT DETECTED Final   Vibrio species NOT DETECTED NOT DETECTED Final   Vibrio cholerae NOT DETECTED NOT DETECTED Final   Enteroaggregative E coli (EAEC) NOT DETECTED NOT DETECTED Final   Enteropathogenic E coli (EPEC) NOT DETECTED NOT DETECTED Final   Enterotoxigenic E coli (ETEC) NOT DETECTED NOT DETECTED  Final   Shiga like toxin producing E coli (STEC) NOT DETECTED NOT DETECTED Final   Shigella/Enteroinvasive E coli (EIEC) NOT DETECTED NOT DETECTED Final   Cryptosporidium NOT DETECTED NOT DETECTED Final   Cyclospora cayetanensis NOT DETECTED NOT DETECTED Final   Entamoeba histolytica NOT DETECTED NOT DETECTED Final   Giardia lamblia NOT DETECTED NOT DETECTED Final   Adenovirus F40/41 NOT DETECTED NOT DETECTED Final   Astrovirus NOT DETECTED NOT DETECTED Final   Norovirus GI/GII NOT DETECTED NOT DETECTED Final   Rotavirus A NOT DETECTED NOT DETECTED Final   Sapovirus (I, II, IV, and V) NOT DETECTED NOT DETECTED Final    Comment: Performed at [facility], [address], [place], [place] [address]     Time coordinating  discharge: 35 minutes  SIGNED:   Aline August, MD  [facility] 01/03/2019, 9:43 AM

## 2019-01-03 NOTE — Progress Notes (Signed)
[facility] ROUNDING NOTE   Subjective:   48 year old gentleman with history of end-stage renal disease status post renal transplantation 2015.  Type 2 diabetes hyperlipidemia hypertension.  Presented with acute on chronic kidney disease.  He presented with nausea and vomiting x3 weeks.  He also was unable to keep his immunosuppressive medications down at this time.  He has a reported creatinine at baseline of 2 to 3 mg/dL.  In March 2020 his creatinine was 2.7 at [facility].  Renal biopsy 01/01/2019 did not show any evidence of acute rejection.  Blood pressure 135/81 pulse 82 temperature 98.7 O2 sats 90% room air  Sodium 136 potassium 4.4 chloride 105 CO2 23 BUN 20 creatinine 4.12 glucose 94 calcium 8.4 WBC 5.4 hemoglobin 8.6 platelets 100.  Darbepoetin 200 mcg administered 01/01/2019, insulin sliding scale, glargine 6 units subcu 10 AM, Myfortic 720 mg twice daily, tacrolimus 6 mg 10 AM 5 mg 10 PM   There is no recorded urine output 01/02/2019.  He did receive blood.  Weight 56.4 unchanged  His last dialysis was 12/30/2018.  Net ultrafiltration was 0.  Objective:  Vital signs in last 24 hours:  Temp:  [98 F (36.7 C)-98.7 F (37.1 C)] 98.7 F (37.1 C) (05/20 0544) Pulse Rate:  [74-82] 82 (05/20 0544) Resp:  [16] 16 (05/20 0544) BP: (121-135)/(64-81) 135/81 (05/20 0544) SpO2:  [100 %] 100 % (05/20 0544) Weight:  [56.4 kg] 56.4 kg (05/20 0532)  Weight change: -1.4 kg Filed Weights   01/01/19 0603 01/02/19 0527 01/03/19 0532  Weight: 58.1 kg 57.8 kg 56.4 kg    Intake/Output: I/O last 3 completed shifts: In: 315 [Blood:315] Out: -    Intake/Output this shift:  Total I/O In: 120 [P.O.:120] Out: -  Nondistressed CVS- RRR no JVP murmurs rubs or gallops RS- CTA ABD-nondistended no organosplenomegaly nontender transplant EXT- no edema   Basic Metabolic Panel: Recent Labs  Lab 12/28/18 0329  12/29/18 0342 12/30/18 0256 12/31/18 0340  01/01/19 0238 01/02/19 0238 01/03/19 0533  NA 137   < > 135 137 137 136 134* 136  K 2.3*   < > 3.5 3.5 3.4* 3.9 4.1 4.4  CL 100   < > 104 102 102 104 104 105  CO2 25   < > 20* 25 25 23 23 23   GLUCOSE 92   < > 92 131* 116* 135* 137* 94  BUN 56*   < > 50* 21* 21* 20 20 20   CREATININE 6.63*   < > 6.28* 3.53* 4.04* 4.13* 4.11* 4.12*  CALCIUM 6.3*   < > 6.8* 7.2* 7.5* 7.5* 7.9* 8.4*  MG  --   --  1.7 2.2  --   --   --   --   PHOS 2.1*  --  2.6 2.1* 2.6 3.1  --   --    < > = values in this interval not displayed.    Liver Function Tests: Recent Labs  Lab 12/28/18 0329 12/29/18 0342 12/30/18 0256 12/31/18 0340 01/01/19 0238  ALBUMIN 2.6* 2.5* 2.6* 2.7* 2.4*   No results for input(s): LIPASE, AMYLASE in the last 168 hours. No results for input(s): AMMONIA in the last 168 hours.  CBC: Recent Labs  Lab 12/31/18 0340 01/01/19 0238 01/02/19 0238 01/02/19 1342 01/02/19 2345 01/03/19 0533  WBC 7.3 5.3 5.2  --  6.1 5.4  NEUTROABS  --   --   --   --  4.3 3.4  HGB 7.4* 7.0* 6.6* 8.5* 10.2*  8.6*  HCT 23.3* 22.1* 20.5* 26.0* 31.3* 26.6*  MCV 82.0 82.2 82.7  --  83.9 83.9  PLT 82* 82* 94*  --  101* 100*    Cardiac Enzymes: No results for input(s): CKTOTAL, CKMB, CKMBINDEX, TROPONINI in the last 168 hours.  BNP: Invalid input(s): POCBNP  CBG: Recent Labs  Lab 01/02/19 0826 01/02/19 1206 01/02/19 1808 01/02/19 2136 01/03/19 0808  GLUCAP 84 113* 101* 120* 24    Microbiology: Results for orders placed or performed during the hospital encounter of 12/24/18  Culture, blood (single)     Status: None   Collection Time: 12/24/18  6:04 PM  Result Value Ref Range Status   Specimen Description BLOOD SITE NOT SPECIFIED  Final   Special Requests   Final    BOTTLES DRAWN AEROBIC AND ANAEROBIC Blood Culture adequate volume   Culture   Final    NO GROWTH 5 DAYS Performed at [facility], [address] [place], [place] [address]    Report Status 12/29/2018 FINAL  Final   SARS Coronavirus 2 (CEPHEID - Performed in [place] hospital lab), Hosp Order     Status: None   Collection Time: 12/24/18  6:19 PM  Result Value Ref Range Status   SARS Coronavirus 2 NEGATIVE NEGATIVE Final    Comment: (NOTE) If result is NEGATIVE SARS-CoV-2 target nucleic acids are NOT DETECTED. The SARS-CoV-2 RNA is generally detectable in upper and lower  respiratory specimens during the acute phase of infection. The lowest  concentration of SARS-CoV-2 viral copies this assay can detect is 250  copies / mL. A negative result does not preclude SARS-CoV-2 infection  and should not be used as the sole basis for treatment or other  patient management decisions.  A negative result may occur with  improper specimen collection / handling, submission of specimen other  than nasopharyngeal swab, presence of viral mutation(s) within the  areas targeted by this assay, and inadequate number of viral copies  (<250 copies / mL). A negative result must be combined with clinical  observations, patient history, and epidemiological information. If result is POSITIVE SARS-CoV-2 target nucleic acids are DETECTED. The SARS-CoV-2 RNA is generally detectable in upper and lower  respiratory specimens dur ing the acute phase of infection.  Positive  results are indicative of active infection with SARS-CoV-2.  Clinical  correlation with patient history and other diagnostic information is  necessary to determine patient infection status.  Positive results do  not rule out bacterial infection or co-infection with other viruses. If result is PRESUMPTIVE POSTIVE SARS-CoV-2 nucleic acids MAY BE PRESENT.   A presumptive positive result was obtained on the submitted specimen  and confirmed on repeat testing.  While 2019 novel coronavirus  (SARS-CoV-2) nucleic acids may be present in the submitted sample  additional confirmatory testing may be necessary for epidemiological  and / or clinical management  purposes  to differentiate between  SARS-CoV-2 and other Sarbecovirus currently known to infect humans.  If clinically indicated additional testing with an alternate test  methodology [phone]) is advised. The SARS-CoV-2 RNA is generally  detectable in upper and lower respiratory sp ecimens during the acute  phase of infection. The expected result is Negative. Fact Sheet for Patients:  StrictlyIdeas.no Fact Sheet for Healthcare Providers: BankingDealers.co.za This test is not yet approved or cleared by the [place] FDA and has been authorized for detection and/or diagnosis of SARS-CoV-2 by FDA under an Emergency Use Authorization (EUA).  This EUA will remain  in effect (meaning this test can be used) for the duration of the COVID-19 declaration under Section 564(b)(1) of the Act, 21 U.S.C. section 360bbb-3(b)(1), unless the authorization is terminated or revoked sooner. Performed at [facility], [place] [address], [place], [place] [address]   Stool culture (children & immunocomp patients)     Status: None   Collection Time: 12/25/18  7:20 PM  Result Value Ref Range Status   Salmonella/Shigella Screen Final report  Final   Campylobacter Culture Final report  Final   E coli, Shiga toxin Assay Negative Negative Final    Comment: (NOTE) Performed At: [facility] [place], [place] [id number] Rush Farmer MD EU:[id number]   C difficile quick scan w PCR reflex     Status: None   Collection Time: 12/25/18  7:20 PM  Result Value Ref Range Status   C Diff antigen NEGATIVE NEGATIVE Final   C Diff toxin NEGATIVE NEGATIVE Final   C Diff interpretation No C. difficile detected.  Final    Comment: Performed at [facility], [place] [address], [place], [place] [address]  STOOL CULTURE REFLEX - RSASHR     Status: None   Collection Time: 12/25/18  7:20 PM  Result Value Ref Range Status   Stool Culture result 1  (RSASHR) Comment  Final    Comment: (NOTE) No Salmonella or Shigella recovered. Performed At: [facility] [address] [place], [place] [id number] Rush Farmer MD OI:[id number]   STOOL CULTURE Reflex - CMPCXR     Status: None   Collection Time: 12/25/18  7:20 PM  Result Value Ref Range Status   Stool Culture result 1 (CMPCXR) Comment  Final    Comment: (NOTE) No Campylobacter species isolated. Performed At: [facility], [place] [id number] Rush Farmer MD JQ:[id number]   Gastrointestinal Panel by PCR , Stool     Status: None   Collection Time: 12/28/18 10:06 AM  Result Value Ref Range Status   Campylobacter species NOT DETECTED NOT DETECTED Final   Plesimonas shigelloides NOT DETECTED NOT DETECTED Final   Salmonella species NOT DETECTED NOT DETECTED Final   Yersinia enterocolitica NOT DETECTED NOT DETECTED Final   Vibrio species NOT DETECTED NOT DETECTED Final   Vibrio cholerae NOT DETECTED NOT DETECTED Final   Enteroaggregative E coli (EAEC) NOT DETECTED NOT DETECTED Final   Enteropathogenic E coli (EPEC) NOT DETECTED NOT DETECTED Final   Enterotoxigenic E coli (ETEC) NOT DETECTED NOT DETECTED Final   Shiga like toxin producing E coli (STEC) NOT DETECTED NOT DETECTED Final   Shigella/Enteroinvasive E coli (EIEC) NOT DETECTED NOT DETECTED Final   Cryptosporidium NOT DETECTED NOT DETECTED Final   Cyclospora cayetanensis NOT DETECTED NOT DETECTED Final   Entamoeba histolytica NOT DETECTED NOT DETECTED Final   Giardia lamblia NOT DETECTED NOT DETECTED Final   Adenovirus F40/41 NOT DETECTED NOT DETECTED Final   Astrovirus NOT DETECTED NOT DETECTED Final   Norovirus GI/GII NOT DETECTED NOT DETECTED Final   Rotavirus A NOT DETECTED NOT DETECTED Final   Sapovirus (I, II, IV, and V) NOT DETECTED NOT DETECTED Final    Comment: Performed at [facility] [address] [place], [place] [address]    Coagulation Studies: Recent  Labs    01/01/19 0238  LABPROT 14.3  INR 1.1    Urinalysis: No results for input(s): COLORURINE, LABSPEC, PHURINE, GLUCOSEU, HGBUR, BILIRUBINUR, KETONESUR, PROTEINUR, UROBILINOGEN, NITRITE, LEUKOCYTESUR in the last 72 hours.  Invalid input(s):  APPERANCEUR    Imaging: No results found.   Medications:    . sodium chloride   Intravenous Once  . chlorhexidine  15 mL Mouth Rinse BID  . darbepoetin (ARANESP) injection - NON-DIALYSIS  200 mcg Subcutaneous Q Mon-1800  . insulin aspart  0-5 Units Subcutaneous QHS  . insulin aspart  0-9 Units Subcutaneous TID WC  . insulin glargine  4 Units Subcutaneous Daily  . mouth rinse  15 mL Mouth Rinse q12n4p  . mycophenolate  720 mg Oral BID  . tacrolimus  6 mg Oral Daily   And  . tacrolimus  5 mg Oral QHS   acetaminophen, iohexol, loperamide, phenol, promethazine  Assessment/ Plan:   Acute kidney injury on chronic kidney disease.  Baseline serum creatinine appears to be 2 to 3 mg/dL he presented with acute kidney injury.  His renal function appears to have stabilized but has not returned to baseline  His last dialysis treatment was 12/30/2018.  Status post renal transplant biopsy 01/01/2019.  Chronic changes with no evidence of any acute rejection  DKA on admission has resolved with insulin and now was transitioned to subcutaneous insulin.  Appears to have resolved  Metabolic acidosis has improved with resolution of DKA and initiation of dialysis.  Diarrhea continues to be problematic CMV PCR pending  Immunosuppressive medications.  Tacrolimus level 4.1.  Appears to be stable on tacrolimus and CellCept   Nausea vomiting and diarrhea appears to have improved  Chronic thrombocytopenia platelet counts remain stable  Anemia may benefit from transfusion.  Status post transfusion 01/02/2019.  Started darbepoetin 01/01/2019  Will sign off follow-up [facility] 1 week.   LOS: Mondamin @TODAY @10 :06 AM

## 2019-01-03 NOTE — Progress Notes (Signed)
When I gave the patient his 2200 meds (myfortic and prograf), he took the pills and sat them on his bedside table, stated that his stomach felt uneasy and that he would take them later.  I asked the patient if he would like to take the PRN phenergan that he had ordered to help with his stomach and he agreed.    At approximately 0355, the NT Atesha notified me that the patient's myfortic and prograf were still sitting on his bedside table.  I went the room and noticed that none of the pills I had given him earlier had been taken.  I discarded the pills and edited the administration in the [facility] to not given.    When I initially gave the medications to the patient I explained to him that I needed him to take the pills when I gave them to him.

## 2019-01-04 NOTE — Telephone Encounter (Signed)
I have made the 2nd attempt to contact the patient or family member in charge, in order to follow up from recently being discharged from the hospital. I left a message on voicemail but I will make another attempt at a different time.  

## 2019-01-09 NOTE — Telephone Encounter (Signed)
I have made the 3rd attempt to contact the patient or family member in charge, in order to follow up from recently being discharged from the hospital. I left a message on voicemail.

## 2019-01-10 ENCOUNTER — Encounter (HOSPITAL_COMMUNITY): Payer: Self-pay | Admitting: Nephrology

## 2019-01-10 LAB — CMV DNA, QUANTITATIVE, PCR
CMV DNA Quant: POSITIVE IU/mL
Log10 CMV Qn DNA Pl: UNDETERMINED log10 IU/mL

## 2019-01-11 ENCOUNTER — Other Ambulatory Visit: Payer: Self-pay | Admitting: *Deleted

## 2019-01-11 MED ORDER — ATORVASTATIN CALCIUM 20 MG PO TABS: ORAL_TABLET | ORAL | 0 refills | Status: DC

## 2019-01-11 NOTE — Telephone Encounter (Signed)
[name]  Patient needs appointment before anymore future refills. Noted.

## 2019-01-29 ENCOUNTER — Other Ambulatory Visit: Payer: Self-pay

## 2019-01-29 ENCOUNTER — Ambulatory Visit: Payer: Self-pay

## 2019-01-29 ENCOUNTER — Inpatient Hospital Stay (HOSPITAL_COMMUNITY)
Admission: EM | Admit: 2019-01-29 | Discharge: 2019-02-05 | DRG: 698 | Disposition: A | Discharge status: Home / Self Care | Payer: Medicare Other | Attending: Internal Medicine | Admitting: Internal Medicine

## 2019-01-29 ENCOUNTER — Inpatient Hospital Stay (HOSPITAL_COMMUNITY): Payer: Medicare Other

## 2019-01-29 ENCOUNTER — Encounter: Payer: Medicare Other | Admitting: Nurse Practitioner

## 2019-01-29 ENCOUNTER — Emergency Department (HOSPITAL_COMMUNITY): Payer: Medicare Other

## 2019-01-29 ENCOUNTER — Encounter: Payer: Self-pay | Admitting: Family

## 2019-01-29 DIAGNOSIS — N2581 Secondary hyperparathyroidism of renal origin: Secondary | ICD-10-CM | POA: Diagnosis present

## 2019-01-29 DIAGNOSIS — E739 Lactose intolerance, unspecified: Secondary | ICD-10-CM | POA: Diagnosis present

## 2019-01-29 DIAGNOSIS — T8612 Kidney transplant failure: Secondary | ICD-10-CM | POA: Diagnosis present

## 2019-01-29 DIAGNOSIS — E872 Acidosis, unspecified: Secondary | ICD-10-CM

## 2019-01-29 DIAGNOSIS — N186 End stage renal disease: Secondary | ICD-10-CM | POA: Diagnosis present

## 2019-01-29 DIAGNOSIS — N179 Acute kidney failure, unspecified: Secondary | ICD-10-CM | POA: Diagnosis present

## 2019-01-29 DIAGNOSIS — E1122 Type 2 diabetes mellitus with diabetic chronic kidney disease: Secondary | ICD-10-CM | POA: Diagnosis present

## 2019-01-29 DIAGNOSIS — Z681 Body mass index (BMI) 19 or less, adult: Secondary | ICD-10-CM

## 2019-01-29 DIAGNOSIS — E785 Hyperlipidemia, unspecified: Secondary | ICD-10-CM | POA: Diagnosis present

## 2019-01-29 DIAGNOSIS — I420 Dilated cardiomyopathy: Secondary | ICD-10-CM | POA: Diagnosis present

## 2019-01-29 DIAGNOSIS — Z7982 Long term (current) use of aspirin: Secondary | ICD-10-CM

## 2019-01-29 DIAGNOSIS — Z992 Dependence on renal dialysis: Secondary | ICD-10-CM | POA: Diagnosis not present

## 2019-01-29 DIAGNOSIS — L8990 Pressure ulcer of unspecified site, unspecified stage: Secondary | ICD-10-CM | POA: Diagnosis present

## 2019-01-29 DIAGNOSIS — N189 Chronic kidney disease, unspecified: Secondary | ICD-10-CM | POA: Diagnosis not present

## 2019-01-29 DIAGNOSIS — Z79899 Other long term (current) drug therapy: Secondary | ICD-10-CM | POA: Diagnosis not present

## 2019-01-29 DIAGNOSIS — Z9119 Patient's noncompliance with other medical treatment and regimen: Secondary | ICD-10-CM

## 2019-01-29 DIAGNOSIS — Z818 Family history of other mental and behavioral disorders: Secondary | ICD-10-CM

## 2019-01-29 DIAGNOSIS — Z794 Long term (current) use of insulin: Secondary | ICD-10-CM | POA: Diagnosis not present

## 2019-01-29 DIAGNOSIS — Z20828 Contact with and (suspected) exposure to other viral communicable diseases: Secondary | ICD-10-CM | POA: Diagnosis present

## 2019-01-29 DIAGNOSIS — D6959 Other secondary thrombocytopenia: Secondary | ICD-10-CM | POA: Diagnosis present

## 2019-01-29 DIAGNOSIS — D638 Anemia in other chronic diseases classified elsewhere: Secondary | ICD-10-CM | POA: Diagnosis not present

## 2019-01-29 DIAGNOSIS — D62 Acute posthemorrhagic anemia: Secondary | ICD-10-CM | POA: Diagnosis present

## 2019-01-29 DIAGNOSIS — Z833 Family history of diabetes mellitus: Secondary | ICD-10-CM

## 2019-01-29 DIAGNOSIS — I959 Hypotension, unspecified: Secondary | ICD-10-CM | POA: Diagnosis not present

## 2019-01-29 DIAGNOSIS — D631 Anemia in chronic kidney disease: Secondary | ICD-10-CM | POA: Diagnosis present

## 2019-01-29 DIAGNOSIS — Z91048 Other nonmedicinal substance allergy status: Secondary | ICD-10-CM

## 2019-01-29 DIAGNOSIS — E43 Unspecified severe protein-calorie malnutrition: Secondary | ICD-10-CM | POA: Diagnosis present

## 2019-01-29 DIAGNOSIS — M5126 Other intervertebral disc displacement, lumbar region: Secondary | ICD-10-CM | POA: Diagnosis present

## 2019-01-29 DIAGNOSIS — T8619 Other complication of kidney transplant: Secondary | ICD-10-CM | POA: Diagnosis present

## 2019-01-29 DIAGNOSIS — I132 Hypertensive heart and chronic kidney disease with heart failure and with stage 5 chronic kidney disease, or end stage renal disease: Secondary | ICD-10-CM | POA: Diagnosis present

## 2019-01-29 DIAGNOSIS — E11311 Type 2 diabetes mellitus with unspecified diabetic retinopathy with macular edema: Secondary | ICD-10-CM | POA: Diagnosis present

## 2019-01-29 DIAGNOSIS — K529 Noninfective gastroenteritis and colitis, unspecified: Secondary | ICD-10-CM | POA: Diagnosis present

## 2019-01-29 DIAGNOSIS — E1110 Type 2 diabetes mellitus with ketoacidosis without coma: Secondary | ICD-10-CM | POA: Diagnosis present

## 2019-01-29 DIAGNOSIS — E861 Hypovolemia: Secondary | ICD-10-CM | POA: Diagnosis present

## 2019-01-29 DIAGNOSIS — N170 Acute kidney failure with tubular necrosis: Secondary | ICD-10-CM

## 2019-01-29 DIAGNOSIS — E860 Dehydration: Secondary | ICD-10-CM | POA: Diagnosis present

## 2019-01-29 DIAGNOSIS — I5030 Unspecified diastolic (congestive) heart failure: Secondary | ICD-10-CM | POA: Diagnosis present

## 2019-01-29 DIAGNOSIS — E11319 Type 2 diabetes mellitus with unspecified diabetic retinopathy without macular edema: Secondary | ICD-10-CM | POA: Diagnosis present

## 2019-01-29 DIAGNOSIS — R0602 Shortness of breath: Secondary | ICD-10-CM | POA: Diagnosis present

## 2019-01-29 DIAGNOSIS — Z841 Family history of disorders of kidney and ureter: Secondary | ICD-10-CM

## 2019-01-29 DIAGNOSIS — R197 Diarrhea, unspecified: Secondary | ICD-10-CM | POA: Diagnosis not present

## 2019-01-29 DIAGNOSIS — E875 Hyperkalemia: Secondary | ICD-10-CM | POA: Diagnosis present

## 2019-01-29 DIAGNOSIS — E1143 Type 2 diabetes mellitus with diabetic autonomic (poly)neuropathy: Secondary | ICD-10-CM | POA: Diagnosis present

## 2019-01-29 DIAGNOSIS — R64 Cachexia: Secondary | ICD-10-CM | POA: Diagnosis present

## 2019-01-29 DIAGNOSIS — I361 Nonrheumatic tricuspid (valve) insufficiency: Secondary | ICD-10-CM | POA: Diagnosis not present

## 2019-01-29 DIAGNOSIS — Z8249 Family history of ischemic heart disease and other diseases of the circulatory system: Secondary | ICD-10-CM

## 2019-01-29 LAB — POCT I-STAT EG7
Acid-base deficit: 27 mmol/L — ABNORMAL HIGH (ref 0.0–2.0)
Bicarbonate: 4 mmol/L — ABNORMAL LOW (ref 20.0–28.0)
Calcium, Ion: 1.32 mmol/L (ref 1.15–1.40)
HCT: 32 % — ABNORMAL LOW (ref 39.0–52.0)
Hemoglobin: 10.9 g/dL — ABNORMAL LOW (ref 13.0–17.0)
O2 Saturation: 80 %
Potassium: 6.6 mmol/L (ref 3.5–5.1)
Sodium: 136 mmol/L (ref 135–145)
TCO2: <5 mmol/L — ABNORMAL LOW (ref 22–32)
pCO2, Ven: 19.7 mmHg — CL (ref 44.0–60.0)
pH, Ven: 6.921 — CL (ref 7.250–7.430)
pO2, Ven: 71 mmHg — ABNORMAL HIGH (ref 32.0–45.0)

## 2019-01-29 LAB — BLOOD GAS, ARTERIAL
Acid-base deficit: 26.7 mmol/L — ABNORMAL HIGH (ref 0.0–2.0)
Bicarbonate: 2.3 mmol/L — ABNORMAL LOW (ref 20.0–28.0)
Drawn by: 39899
O2 Content: 2 L/min
O2 Saturation: 97.4 %
Patient temperature: 98.6
pH, Arterial: 7.084 — CL (ref 7.350–7.450)
pO2, Arterial: 147 mmHg — ABNORMAL HIGH (ref 83.0–108.0)

## 2019-01-29 LAB — COMPREHENSIVE METABOLIC PANEL
ALT: 13 U/L (ref 0–44)
AST: 8 U/L — ABNORMAL LOW (ref 15–41)
Albumin: 4.4 g/dL (ref 3.5–5.0)
Alkaline Phosphatase: 118 U/L (ref 38–126)
BUN: 100 mg/dL — ABNORMAL HIGH (ref 6–20)
CO2: <7 mmol/L — ABNORMAL LOW (ref 22–32)
Calcium: 9 mg/dL (ref 8.9–10.3)
Chloride: 117 mmol/L — ABNORMAL HIGH (ref 98–111)
Creatinine, Ser: 11.82 mg/dL — ABNORMAL HIGH (ref 0.61–1.24)
GFR calc Af Amer: 5 mL/min — ABNORMAL LOW (ref >60)
GFR calc non Af Amer: 4 mL/min — ABNORMAL LOW (ref >60)
Glucose, Bld: 349 mg/dL — ABNORMAL HIGH (ref 70–99)
Potassium: 6.6 mmol/L (ref 3.5–5.1)
Sodium: 135 mmol/L (ref 135–145)
Total Bilirubin: 0.5 mg/dL (ref 0.3–1.2)
Total Protein: 7 g/dL (ref 6.5–8.1)

## 2019-01-29 LAB — BASIC METABOLIC PANEL
Anion gap: 13 (ref 5–15)
BUN: 99 mg/dL — ABNORMAL HIGH (ref 6–20)
BUN: 95 mg/dL — ABNORMAL HIGH (ref 6–20)
CO2: <7 mmol/L — ABNORMAL LOW (ref 22–32)
CO2: 11 mmol/L — ABNORMAL LOW (ref 22–32)
Calcium: 10 mg/dL (ref 8.9–10.3)
Calcium: 8.8 mg/dL — ABNORMAL LOW (ref 8.9–10.3)
Chloride: 120 mmol/L — ABNORMAL HIGH (ref 98–111)
Chloride: 115 mmol/L — ABNORMAL HIGH (ref 98–111)
Creatinine, Ser: 11.29 mg/dL — ABNORMAL HIGH (ref 0.61–1.24)
Creatinine, Ser: 10.69 mg/dL — ABNORMAL HIGH (ref 0.61–1.24)
GFR calc Af Amer: 5 mL/min — ABNORMAL LOW (ref >60)
GFR calc Af Amer: 6 mL/min — ABNORMAL LOW (ref >60)
GFR calc non Af Amer: 5 mL/min — ABNORMAL LOW (ref >60)
GFR calc non Af Amer: 5 mL/min — ABNORMAL LOW (ref >60)
Glucose, Bld: 269 mg/dL — ABNORMAL HIGH (ref 70–99)
Glucose, Bld: 147 mg/dL — ABNORMAL HIGH (ref 70–99)
Potassium: 6.2 mmol/L — ABNORMAL HIGH (ref 3.5–5.1)
Potassium: 5.3 mmol/L — ABNORMAL HIGH (ref 3.5–5.1)
Sodium: 138 mmol/L (ref 135–145)
Sodium: 139 mmol/L (ref 135–145)

## 2019-01-29 LAB — CBC WITH DIFFERENTIAL/PLATELET
Abs Immature Granulocytes: 0 10*3/uL (ref 0.00–0.07)
Basophils Absolute: 0.3 10*3/uL — ABNORMAL HIGH (ref 0.0–0.1)
Basophils Relative: 1 %
Eosinophils Absolute: 0 10*3/uL (ref 0.0–0.5)
Eosinophils Relative: 0 %
HCT: 30.2 % — ABNORMAL LOW (ref 39.0–52.0)
Hemoglobin: 9.2 g/dL — ABNORMAL LOW (ref 13.0–17.0)
Lymphocytes Relative: 4 %
Lymphs Abs: 1 10*3/uL (ref 0.7–4.0)
MCH: 27.5 pg (ref 26.0–34.0)
MCHC: 30.5 g/dL (ref 30.0–36.0)
MCV: 90.1 fL (ref 80.0–100.0)
Monocytes Absolute: 0.8 10*3/uL (ref 0.1–1.0)
Monocytes Relative: 3 %
Neutro Abs: 24.1 10*3/uL — ABNORMAL HIGH (ref 1.7–7.7)
Neutrophils Relative %: 92 %
Platelets: 127 10*3/uL — ABNORMAL LOW (ref 150–400)
RBC: 3.35 MIL/uL — ABNORMAL LOW (ref 4.22–5.81)
RDW: 17.2 % — ABNORMAL HIGH (ref 11.5–15.5)
WBC: 26.2 10*3/uL — ABNORMAL HIGH (ref 4.0–10.5)
nRBC: 0 % (ref 0.0–0.2)
nRBC: 0 /100 WBC

## 2019-01-29 LAB — C DIFFICILE QUICK SCREEN W PCR REFLEX
C Diff antigen: NEGATIVE
C Diff interpretation: NOT DETECTED
C Diff toxin: NEGATIVE

## 2019-01-29 LAB — MAGNESIUM: Magnesium: 1.5 mg/dL — ABNORMAL LOW (ref 1.7–2.4)

## 2019-01-29 LAB — GLUCOSE, CAPILLARY
Glucose-Capillary: 197 mg/dL — ABNORMAL HIGH (ref 70–99)
Glucose-Capillary: 130 mg/dL — ABNORMAL HIGH (ref 70–99)

## 2019-01-29 LAB — LACTIC ACID, PLASMA: Lactic Acid, Venous: 1.1 mmol/L (ref 0.5–1.9)

## 2019-01-29 LAB — BETA-HYDROXYBUTYRIC ACID: Beta-Hydroxybutyric Acid: 0.33 mmol/L — ABNORMAL HIGH (ref 0.05–0.27)

## 2019-01-29 LAB — IRON AND TIBC
Iron: 187 ug/dL — ABNORMAL HIGH (ref 45–182)
Saturation Ratios: 79 % — ABNORMAL HIGH (ref 17.9–39.5)
TIBC: 237 ug/dL — ABNORMAL LOW (ref 250–450)
UIBC: 50 ug/dL

## 2019-01-29 LAB — CBG MONITORING, ED: Glucose-Capillary: 208 mg/dL — ABNORMAL HIGH (ref 70–99)

## 2019-01-29 LAB — PROCALCITONIN: Procalcitonin: 3.07 ng/mL

## 2019-01-29 LAB — SARS CORONAVIRUS 2: SARS Coronavirus 2: NOT DETECTED

## 2019-01-29 LAB — LIPASE, BLOOD: Lipase: 244 U/L — ABNORMAL HIGH (ref 11–51)

## 2019-01-29 LAB — TROPONIN I: Troponin I: <0.03 ng/mL (ref <0.03)

## 2019-01-29 MED ORDER — PIPERACILLIN-TAZOBACTAM IN DEX 2-0.25 GM/50ML IV SOLN
2.2500 g | Freq: Four times a day (QID) | INTRAVENOUS | Status: DC
  Administered 2019-01-29 – 2019-01-31 (×6): 2.25 g via INTRAVENOUS
  Filled 2019-01-29 (×8): qty 50

## 2019-01-29 MED ORDER — TACROLIMUS 1 MG PO CAPS
5.0000 mg | ORAL_CAPSULE | Freq: Every day | ORAL | Status: DC
  Filled 2019-01-29: qty 5

## 2019-01-29 MED ORDER — STERILE WATER FOR INJECTION IV SOLN
INTRAVENOUS | Status: DC
  Administered 2019-01-29 – 2019-01-30 (×2): via INTRAVENOUS
  Filled 2019-01-29 (×3): qty 850

## 2019-01-29 MED ORDER — DEXTROSE-NACL 5-0.45 % IV SOLN: INTRAVENOUS | Status: DC

## 2019-01-29 MED ORDER — SODIUM BICARBONATE 8.4 % IV SOLN
INTRAVENOUS | Status: DC
  Administered 2019-01-29: 18:00:00 via INTRAVENOUS
  Filled 2019-01-29: qty 150

## 2019-01-29 MED ORDER — SODIUM CHLORIDE 0.45 % IV SOLN: INTRAVENOUS | Status: DC

## 2019-01-29 MED ORDER — FOLIC ACID 1 MG PO TABS: 1.0000 mg | ORAL_TABLET | Freq: Every day | ORAL | Status: DC

## 2019-01-29 MED ORDER — SODIUM CHLORIDE 0.9 % FOR CRRT
INTRAVENOUS_CENTRAL | Status: DC | PRN
  Filled 2019-01-29 (×2): qty 1000

## 2019-01-29 MED ORDER — ORAL CARE MOUTH RINSE
15.0000 mL | Freq: Two times a day (BID) | OROMUCOSAL | Status: DC
  Administered 2019-01-29 – 2019-02-02 (×5): 15 mL via OROMUCOSAL

## 2019-01-29 MED ORDER — TACROLIMUS 1 MG PO CAPS: 5.0000 mg | ORAL_CAPSULE | ORAL | Status: DC

## 2019-01-29 MED ORDER — SODIUM CHLORIDE 0.9 % IV SOLN
250.0000 [IU]/h | INTRAVENOUS | Status: DC
  Filled 2019-01-29: qty 2

## 2019-01-29 MED ORDER — TACROLIMUS 1 MG PO CAPS: 6.0000 mg | ORAL_CAPSULE | Freq: Every day | ORAL | Status: DC

## 2019-01-29 MED ORDER — VANCOMYCIN HCL 10 G IV SOLR
1250.0000 mg | Freq: Once | INTRAVENOUS | Status: DC
  Filled 2019-01-29: qty 1250

## 2019-01-29 MED ORDER — STERILE WATER FOR INJECTION IV SOLN
INTRAVENOUS | Status: DC
  Filled 2019-01-29 (×10): qty 150

## 2019-01-29 MED ORDER — TACROLIMUS 1 MG PO CAPS
3.0000 mg | ORAL_CAPSULE | Freq: Two times a day (BID) | ORAL | Status: DC
  Administered 2019-01-29 – 2019-02-05 (×12): 3 mg via ORAL
  Filled 2019-01-29 (×14): qty 3

## 2019-01-29 MED ORDER — PRISMASOL BGK 0/2.5 32-2.5 MEQ/L IV SOLN
INTRAVENOUS | Status: DC
  Filled 2019-01-29 (×12): qty 5000

## 2019-01-29 MED ORDER — HEPARIN BOLUS VIA INFUSION (CRRT)
1000.0000 [IU] | INTRAVENOUS | Status: DC | PRN
  Filled 2019-01-29: qty 1000

## 2019-01-29 MED ORDER — SODIUM CHLORIDE 0.9 % IV SOLN
1.0000 g | Freq: Once | INTRAVENOUS | Status: AC
  Administered 2019-01-29: 1 g via INTRAVENOUS
  Filled 2019-01-29: qty 10

## 2019-01-29 MED ORDER — SODIUM CHLORIDE 0.9 % IV BOLUS
1000.0000 mL | Freq: Once | INTRAVENOUS | Status: AC
  Administered 2019-01-29: 1000 mL via INTRAVENOUS

## 2019-01-29 MED ORDER — HEPARIN SODIUM (PORCINE) 1000 UNIT/ML DIALYSIS: 1000.0000 [IU] | INTRAMUSCULAR | Status: DC | PRN

## 2019-01-29 MED ORDER — ALBUTEROL SULFATE (2.5 MG/3ML) 0.083% IN NEBU: 10.0000 mg | INHALATION_SOLUTION | Freq: Once | RESPIRATORY_TRACT | Status: DC

## 2019-01-29 MED ORDER — HEPARIN SODIUM (PORCINE) 1000 UNIT/ML DIALYSIS
1000.0000 [IU] | INTRAMUSCULAR | Status: DC | PRN
  Filled 2019-01-29: qty 6

## 2019-01-29 MED ORDER — MYCOPHENOLATE SODIUM 180 MG PO TBEC: 720.0000 mg | DELAYED_RELEASE_TABLET | Freq: Two times a day (BID) | ORAL | Status: DC

## 2019-01-29 MED ORDER — DAPSONE 25 MG PO TABS
50.0000 mg | ORAL_TABLET | Freq: Every day | ORAL | Status: DC
  Administered 2019-01-30 – 2019-02-05 (×6): 50 mg via ORAL
  Filled 2019-01-29 (×7): qty 2

## 2019-01-29 MED ORDER — INSULIN REGULAR(HUMAN) IN NACL 100-0.9 UT/100ML-% IV SOLN
INTRAVENOUS | Status: DC
  Administered 2019-01-29: 5 [IU]/h via INTRAVENOUS
  Filled 2019-01-29: qty 100

## 2019-01-29 MED ORDER — PIPERACILLIN-TAZOBACTAM 3.375 G IVPB 30 MIN: 3.3750 g | Freq: Once | INTRAVENOUS | Status: DC

## 2019-01-29 MED ORDER — CHLORHEXIDINE GLUCONATE CLOTH 2 % EX PADS
6.0000 | MEDICATED_PAD | Freq: Every day | CUTANEOUS | Status: DC
  Administered 2019-01-31: 6 via TOPICAL

## 2019-01-29 NOTE — Progress Notes (Addendum)
PCCM UPDATE     I, Dr Seward Carol have personally reviewed patient's available data, including medical history, events of note, physical examination and test results as part of my evaluation. I have discussed with NP and daytime PCCM providers and Elink.  In addition,  I personally evaluated patient  48 yo M w/ PMHx sig for renal transplant DDRT in 2019 currently in severe metabolic acidosis secondary to ESRD requiring renal replacement (CVVHDF) however the patient is continuing to refuse placement of vascular catheter for access.  He states he just had work done on his fistula and he has been through a lot of procedures and trauma today and wants to wait until morning. I explained in detail the possible outcomes if we wait to place the catheter and initiate dialysis and he understands these consequences including the possibility of death, his response was " been there done that." I have communicated with the NP and RN.    Dr. Seward Carol Pulmonary Critical Care Medicine  01/29/2019 7:21 PM

## 2019-01-29 NOTE — ED Notes (Signed)
ED TO INPATIENT HANDOFF REPORT  ED Nurse Name and Phone #:   S Name/Age/Gender [name] 48 y.o. male Room/Bed: 016C/016C  Code Status   Code Status: Full Code  Home/SNF/Other Home Patient oriented to: self, place, time and situation Is this baseline? Yes   Triage Complete: Triage complete  Chief Complaint sob/diarrhea  Triage Note Pt here with c/o SHOB and diarrhea. Pt reports having a kidney transplant 6 years ago though occasionally has to receive hemodialysis.    Allergies Allergies  Allergen Reactions  . Lactose Intolerance (Gi) Diarrhea    No whole milk!!  . Other Other (See Comments)    Soy milk burns his tongue  . Pollen Extract Other (See Comments)    Itching, watery eyes and sneezing.  . Tape Rash    PLEASE USE COBAN WRAP Pt can't have paper tape    Level of Care/Admitting Diagnosis ED Disposition    ED Disposition Condition [place]: [place] [100100]  Level of Care: ICU [6]  Covid Evaluation: Person Under Investigation (PUI)  Isolation Risk Level: Low Risk/Droplet (Less than 4L [place] supplementation)  Diagnosis: Acute on chronic renal failure [facility]) [031594]  Admitting Physician: Dorcas Carrow  Attending Physician: Margaretha Seeds [phone]  Estimated length of stay: 3 - 4 days  Certification:: I certify this patient will need inpatient services for at least 2 midnights  PT Class (Do Not Modify): Inpatient [101]  PT Acc Code (Do Not Modify): Private [1]       B Medical/Surgery History Past Medical History:  Diagnosis Date  . Anemia   . CKD (chronic kidney disease), stage III ([place])   . Diabetes mellitus 12/2008   A1C 8.3 12/23/08, 24hr urine protein 2793 mg/day, SPEP neg, proliferative BDR s/p photocoagulation 08/2009 done at [facility] and f/u by DR Groat  . ESRD (end stage renal disease) on dialysis ([place])    1.Initiated HD via right per cate 12/30/2008 (MWF schedule at [facility])  2.Left upper extremity A-V fistula by Dr Oneida Alar 12/30/2008 3.Aemia: Received Venofer 12/30/08 Ferritin 179 % sat 12 12/23/08, 4. Secondary Hyperparathyroidism- PTH 188, P 5.4, Ca 9.2 12/2008  . Hemodialysis-associated hypotension    coreg d/c'd  . Herniated lumbar disc without myelopathy   . History of blood transfusion   . Hyperparathyroidism due to renal insufficiency ([place])   . Hypertension    resloved after initating HD, now with post HD hypotension  . Hypocalcemia   . Hypomagnesemia   . Kidney transplant as cause of abnormal reaction or later complication 4462   [facility]  . Non-ischemic cardiomyopathy ([place])    EF 20-25% 12/23/2008>>EF 40-45% 03/2009>>EF ??? 7/?/2011  . Occult blood positive stool   . Psoriasis    scalp  . Thrombocytopenia ([place])   . Vitamin B 12 deficiency    Past Surgical History:  Procedure Laterality Date  . AV FISTULA PLACEMENT     left  . AV FISTULA PLACEMENT  09/12/2012   Procedure: INSERTION OF ARTERIOVENOUS (AV) GORE-TEX GRAFT ARM;  Surgeon: Elam Dutch, MD;  Location: [place];  Service: Vascular;  Laterality: Left;  KIDNEY TRANSPLANT FAILED  . CATARACT EXTRACTION, BILATERAL    . EYE SURGERY    . INSERTION OF DIALYSIS CATHETER  07/14/2012   Procedure: INSERTION OF DIALYSIS CATHETER;  Surgeon: Mal Misty, MD;  Location: [place];  Service: Vascular;  Laterality: Right;  Ultrasound Guided Insertion of Internal Jugular Dialysis Catheter  .  IR THROMBECTOMY AV FISTULA W/THROMBOLYSIS/PTA INC/SHUNT/IMG LEFT Left 12/28/2018  . IR US GUIDE VASC ACCESS LEFT  12/28/2018  . 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 KIDNEY TRANSPLANT  12/2013   second kidney transplant  . lumbar disc sugery   10/17/2017  . LUMBAR DISC SURGERY    . NEPHRECTOMY TRANSPLANTED ORGAN  08/11/12  . PHOTOCOAGULATION     for diabetic retinopathy 08/1999  . SHUNTOGRAM N/A 07/11/2012   Procedure: Earney Mallet;  Surgeon: Serafina Mitchell, MD;  Location[facility] C[facility] LAB;   Service: Cardiovascular;  Laterality: N/A;     A IV Location/Drains/Wounds Patient Lines/Drains/Airways Status   Active Line/Drains/Airways    Name:   Placement date:   Placement time:   Site:   Days:   Peripheral IV 01/29/19 Right Hand   01/29/19    1321    Hand   less than 1   Fistula / Graft Left Forearm Arteriovenous fistula   -    -    Forearm      Incision Arm Left   -    -        Wound / Incision (Open or Dehisced) 12/25/18 Non-pressure wound Buttocks Bilateral   12/25/18    0100    Buttocks   35          Intake/Output Last 24 hours  Intake/Output Summary (Last 24 hours) at 01/29/2019 1557 Last data filed at 01/29/2019 1548 Gross per 24 hour  Intake 1000 ml  Output -  Net 1000 ml    Labs/Imaging Results for orders placed or performed during the hospital encounter of 01/29/19 (from the past 48 hour(s))  CBG monitoring, ED     Status: Abnormal   Collection Time: 01/29/19 12:24 PM  Result Value Ref Range   Glucose-Capillary 208 (H) 70 - 99 mg/dL  CBC with Differential     Status: Abnormal   Collection Time: 01/29/19 12:45 PM  Result Value Ref Range   WBC 26.2 (H) 4.0 - 10.5 K/uL   RBC 3.35 (L) 4.22 - 5.81 MIL/uL   Hemoglobin 9.2 (L) 13.0 - 17.0 g/dL   HCT 30.2 (L) 39.0 - 52.0 %   MCV 90.1 80.0 - 100.0 fL   MCH 27.5 26.0 - 34.0 pg   MCHC 30.5 30.0 - 36.0 g/dL   RDW 17.2 (H) 11.5 - 15.5 %   Platelets 127 (L) 150 - 400 K/uL   nRBC 0.0 0.0 - 0.2 %   Neutrophils Relative % 92 %   Neutro Abs 24.1 (H) 1.7 - 7.7 K/uL   Lymphocytes Relative 4 %   Lymphs Abs 1.0 0.7 - 4.0 K/uL   Monocytes Relative 3 %   Monocytes Absolute 0.8 0.1 - 1.0 K/uL   Eosinophils Relative 0 %   Eosinophils Absolute 0.0 0.0 - 0.5 K/uL   Basophils Relative 1 %   Basophils Absolute 0.3 (H) 0.0 - 0.1 K/uL   nRBC 0 0 /100 WBC   Abs Immature Granulocytes 0.00 0.00 - 0.07 K/uL   Burr Cells PRESENT     Comment: Performed at [facility], [address], [place], [place] [address]   Comprehensive metabolic panel     Status: Abnormal   Collection Time: 01/29/19 12:45 PM  Result Value Ref Range   Sodium 135 135 - 145 mmol/L   Potassium 6.6 (HH) 3.5 - 5.1 mmol/L    Comment: CRITICAL RESULT CALLED TO, READ BACK BY AND VERIFIED  WITH: [facility] RN @ 3818 01/29/19 LEONARD,A    Chloride 117 (H) 98 - 111 mmol/L   CO2 <7 (L) 22 - 32 mmol/L   Glucose, Bld 349 (H) 70 - 99 mg/dL   BUN 100 (H) 6 - 20 mg/dL   Creatinine, Ser 11.82 (H) 0.61 - 1.24 mg/dL   Calcium 9.0 8.9 - 10.3 mg/dL   Total Protein 7.0 6.5 - 8.1 g/dL   Albumin 4.4 3.5 - 5.0 g/dL   AST 8 (L) 15 - 41 U/L   ALT 13 0 - 44 U/L   Alkaline Phosphatase 118 38 - 126 U/L   Total Bilirubin 0.5 0.3 - 1.2 mg/dL   GFR calc non Af Amer 4 (L) >60 mL/min   GFR calc Af Amer 5 (L) >60 mL/min   Anion gap NOT CALCULATED 5 - 15    Comment: Performed at [facility], [place] [address] [place], [place] [address]  Troponin I - ONCE - STAT     Status: None   Collection Time: 01/29/19 12:45 PM  Result Value Ref Range   Troponin I <0.03 <0.03 ng/mL    Comment: Performed at [facility], [place] [address] [place], [place] [address]  Magnesium     Status: Abnormal   Collection Time: 01/29/19 12:45 PM  Result Value Ref Range   Magnesium 1.5 (L) 1.7 - 2.4 mg/dL    Comment: Performed at [facility] [address] [place], [place] [address]  POCT I-Stat EG7     Status: Abnormal   Collection Time: 01/29/19  2:05 PM  Result Value Ref Range   pH, Ven 6.921 (LL) 7.250 - 7.430   pCO2, Ven 19.7 (LL) 44.0 - 60.0 mmHg   pO2, Ven 71.0 (H) 32.0 - 45.0 mmHg   Bicarbonate 4.0 (L) 20.0 - 28.0 mmol/L   TCO2 <5 (L) 22 - 32 mmol/L   O2 Saturation 80.0 %   Acid-base deficit 27.0 (H) 0.0 - 2.0 mmol/L   Sodium 136 135 - 145 mmol/L   Potassium 6.6 (HH) 3.5 - 5.1 mmol/L   Calcium, Ion 1.32 1.15 - 1.40 mmol/L   HCT 32.0 (L) 39.0 - 52.0 %   Hemoglobin 10.9 (L) 13.0 - 17.0 g/dL   Patient temperature HIDE    Sample type VENOUS     Comment NOTIFIED PHYSICIAN    Dg Chest Port 1 View  Result Date: 01/29/2019 CLINICAL DATA:  Shortness of breath for 1 day. EXAM: PORTABLE CHEST 1 VIEW COMPARISON:  None. FINDINGS: Cardiomediastinal silhouette is normal. Mediastinal contours appear intact. There is no evidence of focal airspace consolidation, pleural effusion or pneumothorax. Osseous structures are without acute abnormality. Soft tissues are grossly normal. IMPRESSION: No active disease. Electronically Signed   By: Fidela Salisbury M.D.   On: 01/29/2019 13:43    Pending Labs Unresulted Labs (From admission, onward)    Start     Ordered   01/30/19 0175  Basic metabolic panel  Tomorrow morning,   R     01/29/19 1544   01/30/19 0500  Blood gas, arterial  Tomorrow morning,   R     01/29/19 1544   01/29/19 1025  Basic metabolic panel  Now then every 4 hours,   STAT     01/29/19 1544   01/29/19 1544  Tacrolimus level  Once,   STAT     01/29/19 1544   01/29/19 1541  Lipase, blood  Once,   STAT  01/29/19 1544   01/29/19 1541  Procalcitonin  Once,   STAT     01/29/19 1544   01/29/19 1541  Urine culture  Once,   STAT     01/29/19 1544   01/29/19 1541  Urinalysis, Routine w reflex microscopic  Once,   STAT     01/29/19 1544   01/29/19 1538  Beta-hydroxybutyric acid  Once,   STAT     01/29/19 1537   01/29/19 1457  SARS Coronavirus 2 (CEPHEID - Performed in [place] hospital lab), Hosp Order  (Asymptomatic Patients Labs)  Once,   STAT    Question:  Rule Out  Answer:  Yes   01/29/19 1456   01/29/19 1456  C difficile quick scan w PCR reflex  (C Difficile quick screen w PCR reflex panel)  Once, for 24 hours,   STAT     01/29/19 1456   01/29/19 1456  Gastrointestinal Panel by PCR , Stool  (Gastrointestinal Panel by PCR, Stool)  ONCE - STAT,   STAT     01/29/19 1456   01/29/19 1414  Blood culture (routine x 2)  BLOOD CULTURE X 2,   STAT     01/29/19 1414   01/29/19 1401  Lactic acid, plasma  Now then every 2 hours,   STAT      01/29/19 1400          Vitals/Pain Today's Vitals   01/29/19 1445 01/29/19 1500 01/29/19 1515 01/29/19 1530  BP: 118/65 121/79 (!) 131/106 (!) 154/88  Pulse:      Resp: (!) 22 (!) 25 19 (!) 23  Temp:      TempSrc:      SpO2:    97%  Weight:      Height:      PainSc:        Isolation Precautions Enteric precautions (UV disinfection)  Medications Medications  insulin regular, human (MYXREDLIN) 100 units/ 100 mL infusion (5 Units/hr Intravenous New Bag/Given 01/29/19 1553)  sodium bicarbonate 150 mEq in dextrose 5 % 1,000 mL infusion (has no administration in time range)  dapsone tablet 50 mg (has no administration in time range)  folic acid (FOLVITE) tablet 1 mg (has no administration in time range)  mycophenolate (MYFORTIC) EC tablet 720 mg (has no administration in time range)  tacrolimus (PROGRAF) capsule 5-6 mg (has no administration in time range)  sodium chloride 0.9 % bolus 1,000 mL (0 mLs Intravenous Stopped 01/29/19 1548)  calcium chloride 1 g in sodium chloride 0.9 % 100 mL IVPB (0 g Intravenous Stopped 01/29/19 1548)    Mobility walks Moderate fall risk   Focused Assessments Pulmonary Assessment Handoff:  Lung sounds: L Breath Sounds: Clear, Diminished R Breath Sounds: Diminished, Clear O2 Device: Nasal Cannula O2 Flow Rate (L/min): 2 L/min      R Recommendations: See Admitting Provider Note  Report given to:   Additional Notes:

## 2019-01-29 NOTE — ED Provider Notes (Signed)
[facility] EMERGENCY DEPARTMENT Provider Note   CSN: [id number] Arrival date & time: 01/29/19  1204    History   Chief Complaint No chief complaint on file.   HPI [name] is a 48 y.o. male.     48 yo M with a chief complaint of shortness of breath and diarrhea.  This been going on for about a week now.  Patient has some mild diffuse abdominal cramping then has a bowel movement and then usually feels that his abdomen is sore.  He does think that his stool is somewhat darker than normal.  Denies nausea or vomiting.  Having some shortness of breath with this.  Denies cough denies fever.  Denies lower extremity edema denies history of PE or DVT.  The history is provided by the patient.  Illness Severity:  Moderate Onset quality:  Gradual Duration:  4 days Timing:  Constant Progression:  Worsening Chronicity:  New Associated symptoms: abdominal pain, diarrhea and shortness of breath   Associated symptoms: no chest pain, no congestion, no fever, no headaches, no myalgias, no rash and no vomiting     Past Medical History:  Diagnosis Date  . Anemia   . CKD (chronic kidney disease), stage III ([place])   . Diabetes mellitus 12/2008   A1C 8.3 12/23/08, 24hr urine protein 2793 mg/day, SPEP neg, proliferative BDR s/p photocoagulation 08/2009 done at [facility] and f/u by DR Groat  . ESRD (end stage renal disease) on dialysis ([place])    1.Initiated HD via right per cate 12/30/2008 (MWF schedule at [facility]) 2.Left upper extremity A-V fistula by Dr Oneida Alar 12/30/2008 3.Aemia: Received Venofer 12/30/08 Ferritin 179 % sat 12 12/23/08, 4. Secondary Hyperparathyroidism- PTH 188, P 5.4, Ca 9.2 12/2008  . Hemodialysis-associated hypotension    coreg d/c'd  . Herniated lumbar disc without myelopathy   . History of blood transfusion   . Hyperparathyroidism due to renal insufficiency ([place])   . Hypertension    resloved after initating HD, now with post HD hypotension  .  Hypocalcemia   . Hypomagnesemia   . Kidney transplant as cause of abnormal reaction or later complication 6712   [facility]  . Non-ischemic cardiomyopathy ([place])    EF 20-25% 12/23/2008>>EF 40-45% 03/2009>>EF ??? 7/?/2011  . Occult blood positive stool   . Psoriasis    scalp  . Thrombocytopenia ([place])   . Vitamin B 12 deficiency     Patient Active Problem List   Diagnosis Date Noted  . Renal transplant recipient 12/25/2018  . Acute renal failure superimposed on stage 3 chronic kidney disease ([place]) 12/25/2018  . Prolonged QT interval 12/25/2018  . Acute diarrhea 12/25/2018  . DKA (diabetic ketoacidoses) ([place]) 12/24/2018  . ARF (acute renal failure) ([place]) 12/24/2018  . AKI (acute kidney injury) ([place])   . Acute renal failure superimposed on stage 5 chronic kidney disease, not on chronic dialysis ([place]) 11/29/2017  . Hyponatremia 11/29/2017  . Diarrhea 11/29/2017  . Metabolic acidosis, NAG, failure of bicarbonate regeneration 11/29/2017  . Normocytic anemia 03/18/2017  . Heme + stool 03/18/2017  . Hyperglycemia without ketosis 26-Feb-2017  . Deceased-donor kidney transplant February 26, 2017  . Congestive dilated cardiomyopathy ([place]) 07/16/2015  . Abnormal cardiovascular function study 07/16/2015  . Preventative health care 04/24/2014  . Tinea capitis 04/24/2014  . Dental infection 10/16/2013  . Hyperlipidemia with target low density lipoprotein (LDL) cholesterol less than 100 mg/dL 08/29/2013  . Proliferative diabetic retinopathy(362.02) 05/24/2013  . 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 DM2 (  diabetes mellitus, type 2) ([place]) 01/15/2009  . Anemia in chronic kidney disease 01/15/2009  . Essential hypertension, benign 01/15/2009  . Dilated cardiomyopathy ([place]) 01/15/2009  . End stage renal disease ([place]) 01/15/2009  . SECONDARY HYPERPARATHYROIDISM 01/15/2009    Past Surgical History:  Procedure Laterality Date  . AV FISTULA PLACEMENT     left  . AV FISTULA PLACEMENT  09/12/2012    Procedure: INSERTION OF ARTERIOVENOUS (AV) GORE-TEX GRAFT ARM;  Surgeon: Elam Dutch, MD;  Location: [facility];  Service: Vascular;  Laterality: Left;  KIDNEY TRANSPLANT FAILED  . CATARACT EXTRACTION, BILATERAL    . EYE SURGERY    . INSERTION OF DIALYSIS CATHETER  07/14/2012   Procedure: INSERTION OF DIALYSIS CATHETER;  Surgeon: Mal Misty, MD;  Location: [facility];  Service: Vascular;  Laterality: Right;  Ultrasound Guided Insertion of Internal Jugular Dialysis Catheter  . IR THROMBECTOMY AV FISTULA W/THROMBOLYSIS/PTA INC/SHUNT/IMG LEFT Left 12/28/2018  . IR US GUIDE VASC ACCESS LEFT  12/28/2018  . 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 KIDNEY TRANSPLANT  12/2013   second kidney transplant  . lumbar disc sugery   10/17/2017  . LUMBAR DISC SURGERY    . NEPHRECTOMY TRANSPLANTED ORGAN  08/11/12  . PHOTOCOAGULATION     for diabetic retinopathy 08/1999  . SHUNTOGRAM N/A 07/11/2012   Procedure: Earney Mallet;  Surgeon: Serafina Mitchell, MD;  Location: [facility] CATH LAB;  Service: Cardiovascular;  Laterality: N/A;        Home Medications    Prior to Admission medications   Medication Sig Start Date End Date Taking? Authorizing Provider  aspirin EC 81 MG tablet Take 81 mg by mouth daily.   Yes [provider]  atorvastatin (LIPITOR) 20 MG tablet Take one tablet by mouth at bedtime for cholesterol. NEEDS AN APPOINTMENT BEFORE ANYMORE FUTURE REFILLS. 01/11/19  Yes [name], NP  carvedilol (COREG) 6.25 MG tablet Take 1 tablet (6.25 mg total) by mouth 2 (two) times daily with a meal. 12/20/17  Yes [name], NP  Continuous Blood Gluc Receiver ([place]) [place] 1 each by Does not apply route 3 (three) times daily. Use to test blood sugar three times daily. Dx: E11.9 08/03/18  Yes [name], NP  Continuous Blood Gluc Sensor (DEXCOM G6 SENSOR) MISC 1 each by Does not apply route 3 (three) times daily. Use to test blood sugar three times  daily. Dx: E11.9 08/03/18  Yes [name], NP  Continuous Blood Gluc Transmit (DEXCOM G6 TRANSMITTER) MISC 1 each by Does not apply route 3 (three) times daily. Use to test blood sugar three times daily. Dx: E11.9 08/03/18  Yes [name], NP  dapsone 25 MG tablet Take 50 mg by mouth daily.   Yes [provider]  folic acid (FOLVITE) 1 MG tablet Take 1 mg by mouth daily.  06/24/15  Yes [provider]  Insulin Pen Needle 31G X 5 MM MISC 5 Units/oz/day by Does not apply route at bedtime. Use 4 times daily to inject insulin to skin 12/07/17  Yes Georgette Shell, MD  mycophenolate (MYFORTIC) 180 MG EC tablet Take 720 mg by mouth 2 (two) times daily.    Yes [provider]  NOVOLOG FLEXPEN 100 UNIT/ML FlexPen INJECT 12 UNITS INTO THE SKIN 3 TIMES DAILY BEFORE MEALS. Patient taking differently: Inject 1-10 Units into the skin 3 (three) times daily. Per sliding scale. 10/31/18  Yes Eubanks,  [name], NP  omeprazole (PRILOSEC) 20 MG capsule Take 1 capsule (20 mg total) by mouth daily. 12/20/17  Yes [name], NP  tacrolimus (PROGRAF) 1 MG capsule Take 5-6 mg by mouth See admin instructions. 6 mg in the morning and 5 mg in the evening - 1000, 2200   Yes [provider]  TRESIBA FLEXTOUCH 100 UNIT/ML SOPN FlexTouch Pen Inject 5 Units into the skin at bedtime.    Yes [provider]    Family History Family History  Problem Relation Age of Onset  . Diabetes Father   . Kidney disease Father   . Other Father        amputation  . Hypertension Mother   . Dementia Mother   . Diabetes Sister   . Hypertension Sister   . Diabetes Sister   . Kidney disease Sister   . Diabetes Sister   . Hypertension Brother   . Diabetes Brother   . CAD Neg Hx   . Sudden Cardiac Death Neg Hx   . Congestive Heart Failure Neg Hx   . Colon cancer Neg Hx   . Rectal cancer Neg Hx   . Esophageal cancer Neg Hx   . Liver cancer Neg Hx     Social History  Social History   Tobacco Use  . Smoking status: Never Smoker  . Smokeless tobacco: Never Used  Substance Use Topics  . Alcohol use: No    Alcohol/week: 0.0 standard drinks  . Drug use: No     Allergies   Lactose intolerance (gi), Other, Pollen extract, and Tape   Review of Systems Review of Systems  Constitutional: Negative for chills and fever.  HENT: Negative for congestion and facial swelling.   Eyes: Negative for discharge and visual disturbance.  Respiratory: Positive for shortness of breath.   Cardiovascular: Negative for chest pain and palpitations.  Gastrointestinal: Positive for abdominal pain and diarrhea. Negative for vomiting.  Musculoskeletal: Negative for arthralgias and myalgias.  Skin: Negative for color change and rash.  Neurological: Negative for tremors, syncope and headaches.  Psychiatric/Behavioral: Negative for confusion and dysphoric mood.     Physical Exam Updated Vital Signs BP 118/65   Pulse 98   Temp 97.6 F (36.4 C) (Oral)   Resp (!) 22   Ht 5' 5"  (1.651 m)   Wt 62.1 kg   SpO2 99%   BMI 22.80 kg/m   Physical Exam Vitals signs and nursing note reviewed.  Constitutional:      Appearance: He is well-developed.  HENT:     Head: Normocephalic and atraumatic.  Eyes:     Pupils: Pupils are equal, round, and reactive to light.  Neck:     Musculoskeletal: Normal range of motion and neck supple.     Vascular: No JVD.  Cardiovascular:     Rate and Rhythm: Normal rate and regular rhythm.     Heart sounds: No murmur. No friction rub. No gallop.   Pulmonary:     Effort: No respiratory distress.     Breath sounds: No wheezing.     Comments: Tachypnea Abdominal:     General: There is no distension.     Tenderness: There is no abdominal tenderness. There is no guarding or rebound.  Musculoskeletal: Normal range of motion.  Skin:    Coloration: Skin is not pale.     Findings: No rash.  Neurological:     Mental Status: He is alert and  oriented to person, place, and time.  Psychiatric:        Behavior: Behavior normal.      ED Treatments / Results  Labs (all labs ordered are listed, but only abnormal results are displayed) Labs Reviewed  CBC WITH DIFFERENTIAL/PLATELET - Abnormal; Notable for the following components:      Result Value   WBC 26.2 (*)    RBC 3.35 (*)    Hemoglobin 9.2 (*)    HCT 30.2 (*)    RDW 17.2 (*)    Platelets 127 (*)    Neutro Abs 24.1 (*)    Basophils Absolute 0.3 (*)    All other components within normal limits  COMPREHENSIVE METABOLIC PANEL - Abnormal; Notable for the following components:   Potassium 6.6 (*)    Chloride 117 (*)    CO2 <7 (*)    Glucose, Bld 349 (*)    BUN 100 (*)    Creatinine, Ser 11.82 (*)    AST 8 (*)    GFR calc non Af Amer 4 (*)    GFR calc Af Amer 5 (*)    All other components within normal limits  MAGNESIUM - Abnormal; Notable for the following components:   Magnesium 1.5 (*)    All other components within normal limits  CBG MONITORING, ED - Abnormal; Notable for the following components:   Glucose-Capillary 208 (*)    All other components within normal limits  POCT I-STAT EG7 - Abnormal; Notable for the following components:   pH, Ven 6.921 (*)    pCO2, Ven 19.7 (*)    pO2, Ven 71.0 (*)    Bicarbonate 4.0 (*)    TCO2 <5 (*)    Acid-base deficit 27.0 (*)    Potassium 6.6 (*)    HCT 32.0 (*)    Hemoglobin 10.9 (*)    All other components within normal limits  CULTURE, BLOOD (ROUTINE X 2)  CULTURE, BLOOD (ROUTINE X 2)  C DIFFICILE QUICK SCREEN W PCR REFLEX  GASTROINTESTINAL PANEL BY PCR, STOOL (REPLACES STOOL CULTURE)  SARS CORONAVIRUS 2 (HOSPITAL ORDER, [facility])  TROPONIN I  LACTIC ACID, PLASMA  LACTIC ACID, PLASMA    EKG EKG Interpretation  Date/Time:  Monday January 29 2019 12:13:55 EDT Ventricular Rate:  97 PR Interval:    QRS Duration: 108 QT Interval:  354 QTC Calculation: 450 R Axis:   118 Text  Interpretation:  Sinus rhythm Short PR interval Low voltage with right axis deviation ST elev, probable normal early repol pattern No significant change since last tracing Confirmed by Deno Etienne [phone]) on 01/29/2019 12:21:03 PM   Radiology Dg Chest Port 1 View  Result Date: 01/29/2019 CLINICAL DATA:  Shortness of breath for 1 day. EXAM: PORTABLE CHEST 1 VIEW COMPARISON:  None. FINDINGS: Cardiomediastinal silhouette is normal. Mediastinal contours appear intact. There is no evidence of focal airspace consolidation, pleural effusion or pneumothorax. Osseous structures are without acute abnormality. Soft tissues are grossly normal. IMPRESSION: No active disease. Electronically Signed   By: Fidela Salisbury M.D.   On: 01/29/2019 13:43    Procedures Procedures (including critical care time)  Medications Ordered in ED Medications  0.45 % sodium chloride infusion (has no administration in time range)  dextrose 5 %-0.45 % sodium chloride infusion (has no administration in time range)  insulin regular, human (MYXREDLIN) 100 units/ 100 mL infusion (has no administration in time range)  albuterol (PROVENTIL) (2.5 MG/3ML) 0.083% nebulizer solution 10 mg (has no administration in time range)  vancomycin (  VANCOCIN) 1,250 mg in sodium chloride 0.9 % 250 mL IVPB (has no administration in time range)  piperacillin-tazobactam (ZOSYN) IVPB 3.375 g (has no administration in time range)  sodium bicarbonate 150 mEq in dextrose 5 % 1,000 mL infusion (has no administration in time range)  sodium chloride 0.9 % bolus 1,000 mL (1,000 mLs Intravenous New Bag/Given 01/29/19 1322)  calcium chloride 1 g in sodium chloride 0.9 % 100 mL IVPB (1 g Intravenous New Bag/Given 01/29/19 1440)     Initial Impression / Assessment and Plan / ED Course  I have reviewed the triage vital signs and the nursing notes.  Pertinent labs & imaging results that were available during my care of the patient were reviewed by me and  considered in my medical decision making (see chart for details).        48 yo M with a chief complaint of shortness of breath and diarrhea.  Patient has been admitted for symptomatic anemia before, does appear somewhat pale on my exam.  He also was recently admitted about a month ago for diabetic ketoacidosis.  This also could be a possibility as he had diarrhea with that presentation as well.  Laboratory evaluation chest x-ray fluid bolus and reassess.  Patient found to have a profound acidosis.  pH of 6.9.  He also has an elevated blood sugar, this could be a diabetic ketoacidosis.  Started on insulin drip.  I discussed the case with the ICU physician.  Recommended I consult nephrology.  Discussed the case with Dr. Detterding, will come and eval patient.   CRITICAL CARE Performed by: Cecilio Asper   Total critical care time: 35 minutes  Critical care time was exclusive of separately billable procedures and treating other patients.  Critical care was necessary to treat or prevent imminent or life-threatening deterioration.  Critical care was time spent personally by me on the following activities: development of treatment plan with patient and/or surrogate as well as nursing, discussions with consultants, evaluation of patient's response to treatment, examination of patient, obtaining history from patient or surrogate, ordering and performing treatments and interventions, ordering and review of laboratory studies, ordering and review of radiographic studies, pulse oximetry and re-evaluation of patient's condition.  The patients results and plan were reviewed and discussed.   Any x-rays performed were independently reviewed by myself.   Differential diagnosis were considered with the presenting HPI.  Medications  0.45 % sodium chloride infusion (has no administration in time range)  dextrose 5 %-0.45 % sodium chloride infusion (has no administration in time range)  insulin  regular, human (MYXREDLIN) 100 units/ 100 mL infusion (has no administration in time range)  albuterol (PROVENTIL) (2.5 MG/3ML) 0.083% nebulizer solution 10 mg (has no administration in time range)  vancomycin (VANCOCIN) 1,250 mg in sodium chloride 0.9 % 250 mL IVPB (has no administration in time range)  piperacillin-tazobactam (ZOSYN) IVPB 3.375 g (has no administration in time range)  sodium bicarbonate 150 mEq in dextrose 5 % 1,000 mL infusion (has no administration in time range)  sodium chloride 0.9 % bolus 1,000 mL (1,000 mLs Intravenous New Bag/Given 01/29/19 1322)  calcium chloride 1 g in sodium chloride 0.9 % 100 mL IVPB (1 g Intravenous New Bag/Given 01/29/19 1440)    Vitals:   01/29/19 1400 01/29/19 1415 01/29/19 1430 01/29/19 1445  BP: (!) 142/69 (!) 149/115 (!) 149/138 118/65  Pulse:      Resp: (!) 26 (!) 27 (!) 27 (!) 22  Temp:  TempSrc:      SpO2:      Weight:      Height:        Final diagnoses:  Hypovolemia  Metabolic acidosis    Admission/ observation were discussed with the admitting physician, patient and/or family and they are comfortable with the plan.    Final Clinical Impressions(s) / ED Diagnoses   Final diagnoses:  Hypovolemia  Metabolic acidosis    ED Discharge Orders    None       Deno Etienne, DO 01/29/19 1520

## 2019-01-29 NOTE — Progress Notes (Signed)
Pt adamantly refusing care. Will not allow NP to place trialysis cath & CVC. Will not allow another IV to be placed. He is allowing this RN to perform assessment.

## 2019-01-29 NOTE — Progress Notes (Signed)
Pharmacy Antibiotic Note  [name] is a 48 y.o. male admitted on 01/29/2019 with shortness of breath x24hrs and diarrhea. PMH significant for ESRD s/p second kidney transplant in 2015. Pharmacy has been consulted for zosyn dosing for possible intra-abdominal infection. Tmax 97.6, tachycardic, WBC 26.2. CrCl estimated 6.5 mL/min.   Plan: Start Zosyn IV 2.25g q6hrs F/u potential plans for HD Monitor renal function, culture data, LOT  Height: 5\' 5"  (165.1 cm) Weight: 137 lb (62.1 kg) IBW/kg (Calculated) : 61.5  Temp (24hrs), Avg:97.6 F (36.4 C), Min:97.6 F (36.4 C), Max:97.6 F (36.4 C)  Recent Labs  Lab 01/29/19 1245  WBC 26.2*  CREATININE 11.82*    Estimated Creatinine Clearance: 6.5 mL/min (A) (by C-G formula based on SCr of 11.82 mg/dL (H)).    Allergies  Allergen Reactions  . Lactose Intolerance (Gi) Diarrhea    No whole milk!!  . Other Other (See Comments)    Soy milk burns his tongue  . Pollen Extract Other (See Comments)    Itching, watery eyes and sneezing.  . Tape Rash    PLEASE USE COBAN WRAP Pt can't have paper tape    Antimicrobials this admission: Zosyn 6/15 >>  Thank you for involving pharmacy in this patient's care.  Janae Bridgeman, PharmD PGY1 Pharmacy Resident Phone: [phone] 01/29/2019 4:06 PM

## 2019-01-29 NOTE — H&P (Signed)
NAME:  [name], MRN:  [id number], DOB:  [birth year], LOS: 0 ADMISSION DATE:  01/29/2019, CONSULTATION DATE:  6/15 REFERRING MD:  Ruthann Cancer, CHIEF COMPLAINT:  Metabolic disarray    Brief History   48 year old male w/ ESRD s/p renal x-plant 2015. Presents w/ cc: 2-3wk h/o watery diarrhea abd cramping and 24 hrs of worsening SOB. Dx eval in ER w/ acute on chronic renal failure and profound anion gap metabolic acidosis (ph 6.9, bicarb 19.7, K 6.6) . PCCM asked to admit.   History of present illness   48 year old patient with history as mentioned below presents emergency room on 6/15 with chief complaint of diarrhea w/ associated abd cramping (approx 1 month) w/ recurrence of  shortness of breath over last 24 hrs.  No nausea or vomiting.  No cough fever.  No recent sick exposures.  Diagnostic evaluation to date:  White blood cell count 26.2, K 6.6, scr 11.82 (baseline ~4), CO2 <7, anion gap to high to calculate w/ glucose of 349.  Past Medical History  End-stage renal disease, stage III, prior renal transplant (received deceased donor renal transplant 2015 which was his second transplant), diabetes type 2, chronic anemia, hyperlipidemia.  Medical noncompliance.  History of not taking insulin or antirejection medications in the past.  Baseline serum creatinine ranging in the 2-3 range but recently peaked at 10.8 Dec 25 2018. Abg: 6.9/19.7/71/TCO2 <5. PCCM asked to admit   [facility]   6/15: admitted w/ cc: diarrhea,  leukocytosis, acute on chronic renal failure w/ profound anion gap metabolic acidosis and hyperkalemia   Consults:  Renal consulted 6/15  Procedures:    Significant Diagnostic Tests:  CT abd/pelvis 6/15>>>  Micro Data:  Cdiff 6/15>>> Stool culture 6/15>>> BC 6/15>>>  Antimicrobials:  Vanc 6/15 Zosyn 6/15>>>   Interim history/subjective:  Seen with abrupt worsening of SOB Appears more stable than lab findings suggest, is profoundly acidotic  (metabolic)   Objective   Blood pressure 118/65, pulse 98, temperature 97.6 F (36.4 C), temperature source Oral, resp. rate (!) 22, height _0  (1.651 m), weight 62.1 kg, SpO2 99 %.       No intake or output data in the 24 hours ending 01/29/19 1512 Filed Weights   01/29/19 1219  Weight: 62.1 kg   Physical Exam: General: Chronically ill-appearing, older than stated age, mild respiratory distress HENT: [place], AT, dry mucous membrane Eyes: EOMI, no scleral icterus Respiratory: Tachpnea, no accessory muscle use, clear to auscultation bilaterally.  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 De-escalate pending culture  data   Thrombocytopenia ESRD, chronic illness P Monitor CBC Transfusion per unit protocol   Hyperkalemia ESRD, possible DKA P Received Calcium in ED On bicarb gtt   Metabolic Acidosis -Likely in setting of ESRD and ongoing base loss with  diarrhea  P Bicarb gtt   DM II  History of DKA P Insulin gtt Check beta hydroxybuteric acid  CHF HTN Holding home antihypertensives at this time  Continue tele  HLD Holding home medications   Best practice:  Diet: NPO  Pain/Anxiety/Delirium protocol (if indicated): n/a VAP protocol (if indicated): n/a DVT prophylaxis: SCD  GI prophylaxis: na Glucose control: insulin gtt Mobility: BR Code Status: Full  Family Communication: patient updated  Disposition: Admit to ICU   Labs   CBC: Recent Labs  Lab 01/29/19 1245 01/29/19 1405  WBC 26.2*  --   NEUTROABS 24.1*  --   HGB 9.2* 10.9*  HCT 30.2* 32.0*  MCV 90.1  --   PLT 127*  --     Basic Metabolic Panel: Recent Labs  Lab 01/29/19 1245 01/29/19 1405  NA 135 136  K 6.6* 6.6*  CL 117*  --   CO2 <7*  --   GLUCOSE 349*  --   BUN 100*  --   CREATININE 11.82*  --   CALCIUM 9.0  --   MG 1.5*  --    GFR: Estimated Creatinine Clearance: 6.5 mL/min (A) (by C-G formula based on SCr of 11.82 mg/dL (H)). Recent Labs  Lab 01/29/19 1245  WBC 26.2*    Liver Function Tests: Recent Labs  Lab 01/29/19 1245  AST 8*  ALT 13  ALKPHOS 118  BILITOT 0.5  PROT 7.0  ALBUMIN 4.4   No results for input(s): LIPASE, AMYLASE in the last 168 hours. No results for input(s): AMMONIA in the last 168 hours.  ABG    Component Value Date/Time   HCO3 4.0 (L) 01/29/2019 1405   TCO2 <5 (L) 01/29/2019 1405   ACIDBASEDEF 27.0 (H) 01/29/2019 1405   O2SAT 80.0 01/29/2019 1405     Coagulation Profile: No results for input(s): INR, PROTIME in the last 168 hours.  Cardiac Enzymes: Recent Labs  Lab 01/29/19 1245  TROPONINI <0.03    HbA1C: Hemoglobin A1C  Date/Time Value Ref Range Status  09/27/2017 15  Final  05/27/2016 14.1  Final   Hgb A1c MFr Bld  Date/Time Value Ref Range Status  12/25/2018 01:10 AM 7.2 (H) 4.8 - 5.6 % Final    Comment:    (NOTE) Pre diabetes:          5.7%-6.4% Diabetes:              >6.4%  Glycemic control for   <7.0% adults with diabetes   08/01/2018 04:20 PM 6.0 (H) <5.7 % of total Hgb Final    Comment:    For someone without known diabetes, a hemoglobin  A1c value between 5.7% and 6.4% is consistent with prediabetes and should be confirmed with a  follow-up test. . For someone with known diabetes, a value <7% indicates that their diabetes is well controlled. A1c targets should be individualized based on duration of diabetes, age, comorbid conditions, and other considerations. . This assay result is consistent with an increased risk of diabetes. . Currently, no consensus exists regarding use of hemoglobin A1c for diagnosis of diabetes for children. .     CBG: Recent Labs  Lab 01/29/19 1224  GLUCAP 208*    Review of Systems:   As  per HPI   Past Medical History  He,  has a past medical history of Anemia, CKD (chronic kidney disease), stage III ([place]), Diabetes mellitus (12/2008), ESRD (end stage renal disease) on dialysis ([place]), Hemodialysis-associated hypotension, Herniated lumbar disc without myelopathy, History of blood transfusion, Hyperparathyroidism due to renal insufficiency ([place]), Hypertension, Hypocalcemia, Hypomagnesemia, Kidney transplant as cause of abnormal reaction or later complication (1610), Non-ischemic cardiomyopathy ([place]), Occult blood positive stool, Psoriasis, Thrombocytopenia ([place]), and Vitamin B 12 deficiency.   Surgical History    Past Surgical History:  Procedure Laterality Date  . AV FISTULA PLACEMENT     left  . AV FISTULA PLACEMENT  09/12/2012   Procedure: INSERTION OF ARTERIOVENOUS (AV) GORE-TEX GRAFT ARM;  Surgeon: Elam Dutch, MD;  Location: [facility];  Service: Vascular;  Laterality: Left;  KIDNEY TRANSPLANT FAILED  . CATARACT EXTRACTION, BILATERAL    . EYE SURGERY    . INSERTION OF DIALYSIS CATHETER  07/14/2012   Procedure: INSERTION OF DIALYSIS CATHETER;  Surgeon: Mal Misty, MD;  Location: [facility];  Service:  Vascular;  Laterality: Right;  Ultrasound Guided Insertion of Internal Jugular Dialysis Catheter  . IR THROMBECTOMY AV FISTULA W/THROMBOLYSIS/PTA INC/SHUNT/IMG LEFT Left 12/28/2018  . IR US GUIDE VASC ACCESS LEFT  12/28/2018  . 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 KIDNEY TRANSPLANT  12/2013   second kidney transplant  . lumbar disc sugery   10/17/2017  . LUMBAR DISC SURGERY    . NEPHRECTOMY TRANSPLANTED ORGAN  08/11/12  . PHOTOCOAGULATION     for diabetic retinopathy 08/1999  . SHUNTOGRAM N/A 07/11/2012   Procedure: [facility];  Surgeon: Serafina Mitchell, MD;  [facility] LAB;  Service: Cardiovascular;  Laterality: N/A;     Social History   reports that he has never smoked. He has never used smokeless tobacco. He reports that he does not drink alcohol or use drugs.   Family History   His family history includes Dementia in his mother; Diabetes in his brother, father, sister, sister, and sister; Hypertension in his brother, mother, and sister; Kidney disease in his father and sister; Other in his father. There is no history of CAD, Sudden Cardiac Death, Congestive Heart Failure, Colon cancer, Rectal cancer, Esophageal cancer, or Liver cancer.   Allergies Allergies  Allergen Reactions  . Lactose Intolerance (Gi) Diarrhea    No whole milk!!  . Other Other (See Comments)    Soy milk burns his tongue  . Pollen Extract Other (See Comments)    Itching, watery eyes and sneezing.  . Tape Rash    PLEASE USE COBAN WRAP Pt can't have paper tape     Home Medications  Prior to Admission medications   Medication Sig Start Date End Date Taking? Authorizing Provider  aspirin EC 81 MG tablet Take 81 mg by mouth daily.   Yes [provider]  atorvastatin (LIPITOR) 20 MG tablet Take one tablet by mouth at bedtime for cholesterol. NEEDS AN APPOINTMENT BEFORE ANYMORE FUTURE REFILLS. 01/11/19  Yes [name], NP  carvedilol (COREG) 6.25 MG tablet Take  1 tablet (6.25 mg total) by mouth 2 (two) times daily with a meal. 12/20/17  Yes [name], NP  Continuous Blood Gluc Receiver ([place]) [place] 1 each by Does not apply route 3 (three) times daily. Use to test blood sugar three times daily. Dx: E11.9 08/03/18  Yes [name], NP  Continuous Blood Gluc Sensor (DEXCOM G6  SENSOR) MISC 1 each by Does not apply route 3 (three) times daily. Use to test blood sugar three times daily. Dx: E11.9 08/03/18  Yes [name], NP  Continuous Blood Gluc Transmit (DEXCOM G6 TRANSMITTER) MISC 1 each by Does not apply route 3 (three) times daily. Use to test blood sugar three times daily. Dx: E11.9 08/03/18  Yes [name], NP  dapsone 25 MG tablet Take 50 mg by mouth daily.   Yes [provider]  folic acid (FOLVITE) 1 MG tablet Take 1 mg by mouth daily.  06/24/15  Yes [provider]  Insulin Pen Needle 31G X 5 MM MISC 5 Units/oz/day by Does not apply route at bedtime. Use 4 times daily to inject insulin to skin 12/07/17  Yes Georgette Shell, MD  mycophenolate (MYFORTIC) 180 MG EC tablet Take 720 mg by mouth 2 (two) times daily.    Yes [provider]  NOVOLOG FLEXPEN 100 UNIT/ML FlexPen INJECT 12 UNITS INTO THE SKIN 3 TIMES DAILY BEFORE MEALS. Patient taking differently: Inject 1-10 Units into the skin 3 (three) times daily. Per sliding scale. 10/31/18  Yes [name], NP  omeprazole (PRILOSEC) 20 MG capsule Take 1 capsule (20 mg total) by mouth daily. 12/20/17  Yes [name], NP  tacrolimus (PROGRAF) 1 MG capsule Take 5-6 mg by mouth See admin instructions. 6 mg in the morning and 5 mg in the evening - 1000, 2200   Yes [provider]  TRESIBA FLEXTOUCH 100 UNIT/ML SOPN FlexTouch Pen Inject 5 Units into the skin at bedtime.    Yes [provider]     Critical care time: 50 min  The patient is critically ill with multiple organ systems failure and requires high  complexity decision making for assessment and support, frequent evaluation and titration of therapies, application of advanced monitoring technologies and extensive interpretation of multiple databases.   Critical Care Time devoted to patient care services described in this note is 60 Minutes. This time reflects time of care of this signee Dr. Rodman Pickle. This critical care time does not reflect procedure time, or teaching time or supervisory time of PA/NP/Med student/Med Resident etc but could involve care discussion time.  Rodman Pickle, M.D. [facility] Pulmonary/Critical Care Medicine Pager: [phone] After hours pager: [phone]

## 2019-01-29 NOTE — Progress Notes (Signed)
Assisted tele visit to patient with wife.  Mariely Mahr R, RN  

## 2019-01-29 NOTE — Progress Notes (Signed)
PCCM Brief Communication Note   2 attempts have been made to reach spouse via phone, unable to connect.     Eliseo Gum MSN, AGACNP-BC [place] [phone] If no answer, [phone] 01/29/2019, 4:55 PM

## 2019-01-29 NOTE — Consult Note (Signed)
Reason for Consult:CKD 5, acidemia, uremia Referring Physician: Dr. [name] is an 48 y.o. male.  HPI: 48 yr male with DDRT 2019 that has been failing. Here with D induces MA with worsened kidney function 5/10-5/20, and received HD. Cr on D/C was in mid 4s and rising. Did not come to office the next week as instructed.  Has D x 3 wk, now with 3 loose BM/d. Progressive weakness, SOB, anorexia, chills.  Not coughing or vomiting.  Other illnesses, HTN, Type 2 DM,  DDD,  Constitutional: as above Eyes: DM retinopathy Ears, nose, mouth, throat, and face: Dry mouth Respiratory: SOB Cardiovascular: no swelling or CP Gastrointestinal: as above Genitourinary:very little urine Integument/breast: dry itching,  Musculoskeletal:negative Endocrine: DM Allergic/Immunologic: Lactose, pollens, tape    Primary Nephrologist [name]. .   Past Medical History:  Diagnosis Date  . Anemia   . CKD (chronic kidney disease), stage III ([name])   . 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 ESRD (end stage renal disease) on dialysis ([name])    1.Initiated HD via right per cate 12/30/2008 (MWF schedule at [name]) 2.Left upper extremity A-V fistula by [name] 12/30/2008 3.Aemia: Received Venofer 12/30/08 Ferritin 179 % sat 12 12/23/08, 4. Secondary Hyperparathyroidism- PTH 188, P 5.4, Ca 9.2 12/2008  . Hemodialysis-associated hypotension    coreg d/c'd  . Herniated lumbar disc without myelopathy   . History of blood transfusion   . Hyperparathyroidism due to renal insufficiency ([name])   . Hypertension    resloved after initating HD, now with post HD hypotension  . Hypocalcemia   . Hypomagnesemia   . Kidney transplant as cause of abnormal reaction or later complication 2595   [facility]  . Non-ischemic cardiomyopathy ([name])    EF 20-25% 12/23/2008>>EF 40-45%  03/2009>>EF ??? 7/?/2011  . Occult blood positive stool   . Psoriasis    scalp  . Thrombocytopenia ([name])   . Vitamin B 12 deficiency     Past Surgical History:  Procedure Laterality Date  . AV FISTULA PLACEMENT     left  . AV FISTULA PLACEMENT  09/12/2012   Procedure: INSERTION OF ARTERIOVENOUS (AV) GORE-TEX GRAFT ARM;  Surgeon: Alexis Dutch, MD;  Location: [name];  Service: Vascular;  Laterality: Left;  KIDNEY TRANSPLANT FAILED  . CATARACT EXTRACTION, BILATERAL    . EYE SURGERY    . INSERTION OF DIALYSIS CATHETER  07/14/2012   Procedure: INSERTION OF DIALYSIS CATHETER;  Surgeon: Alexis Misty, MD;  Location: [facility];  Service: Vascular;  Laterality: Right;  Ultrasound Guided Insertion of Internal Jugular Dialysis Catheter  . IR THROMBECTOMY AV FISTULA W/THROMBOLYSIS/PTA INC/SHUNT/IMG LEFT Left 12/28/2018  . IR US GUIDE VASC ACCESS LEFT  12/28/2018  . 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 KIDNEY TRANSPLANT  12/2013   second kidney transplant  . lumbar disc sugery   10/17/2017  . LUMBAR DISC SURGERY    . NEPHRECTOMY TRANSPLANTED ORGAN  08/11/12  . PHOTOCOAGULATION     for diabetic retinopathy 08/1999  . SHUNTOGRAM N/A 07/11/2012   Procedure: [facility];  Surgeon: Alexis Mitchell, MD;  Location: [name] CATH [facility];  Service: Cardiovascular;  Laterality: N/A;    Family History  Problem Relation Age of Onset  . Diabetes Father   . Kidney disease Father   . Other  Father        amputation  . Hypertension Mother   . Dementia Mother   . Diabetes Sister   . Hypertension Sister   . Diabetes Sister   . Kidney disease Sister   . Diabetes Sister   . Hypertension Brother   . Diabetes Brother   . CAD Neg Hx   . Sudden Cardiac Death Neg Hx   . Congestive Heart Failure Neg Hx   . Colon cancer Neg Hx   . Rectal cancer Neg Hx   . Esophageal cancer Neg Hx   . Liver cancer Neg Hx     Social History:  reports that he has never smoked. He has never used smokeless  tobacco. He reports that he does not drink alcohol or use drugs.  Allergies:  Allergies  Allergen Reactions  . Lactose Intolerance (Gi) Diarrhea    No whole milk!!  . Other Other (See Comments)    Soy milk burns his tongue  . Pollen Extract Other (See Comments)    Itching, watery eyes and sneezing.  . Tape Rash    PLEASE USE COBAN WRAP Pt can't have paper tape    Medications: I have reviewed the patient's current medications. Prior to Admission: (Not in a [facility] admission)   Results for orders placed or performed during the Morgan encounter of 01/29/19 (from the past 48 hour(s))  CBG monitoring, ED     Status: Abnormal   Collection Time: 01/29/19 12:24 PM  Result Value Ref Range   Glucose-Capillary 208 (H) 70 - 99 mg/dL  CBC with Differential     Status: Abnormal   Collection Time: 01/29/19 12:45 PM  Result Value Ref Range   WBC 26.2 (H) 4.0 - 10.5 K/uL   RBC 3.35 (L) 4.22 - 5.81 MIL/uL   Hemoglobin 9.2 (L) 13.0 - 17.0 g/dL   HCT 30.2 (L) 39.0 - 52.0 %   MCV 90.1 80.0 - 100.0 fL   MCH 27.5 26.0 - 34.0 pg   MCHC 30.5 30.0 - 36.0 g/dL   RDW 17.2 (H) 11.5 - 15.5 %   Platelets 127 (L) 150 - 400 K/uL   nRBC 0.0 0.0 - 0.2 %   Neutrophils Relative % 92 %   Neutro Abs 24.1 (H) 1.7 - 7.7 K/uL   Lymphocytes Relative 4 %   Lymphs Abs 1.0 0.7 - 4.0 K/uL   Monocytes Relative 3 %   Monocytes Absolute 0.8 0.1 - 1.0 K/uL   Eosinophils Relative 0 %   Eosinophils Absolute 0.0 0.0 - 0.5 K/uL   Basophils Relative 1 %   Basophils Absolute 0.3 (H) 0.0 - 0.1 K/uL   nRBC 0 0 /100 WBC   Abs Immature Granulocytes 0.00 0.00 - 0.07 K/uL   Burr Cells PRESENT     Comment: Performed at [name], [address] [name], [name] [address]  Comprehensive metabolic panel     Status: Abnormal   Collection Time: 01/29/19 12:45 PM  Result Value Ref Range   Sodium 135 135 - 145 mmol/L   Potassium 6.6 (HH) 3.5 - 5.1 mmol/L    Comment: CRITICAL RESULT CALLED TO, READ BACK BY AND  VERIFIED WITH: MARSHALL,C RN @ 7341 01/29/19 [name]    Chloride 117 (H) 98 - 111 mmol/L   CO2 <7 (L) 22 - 32 mmol/L   Glucose, Bld 349 (H) 70 - 99 mg/dL   BUN 100 (H) 6 - 20 mg/dL   Creatinine, Ser 11.82 (H) 0.61 -  1.24 mg/dL   Calcium 9.0 8.9 - 10.3 mg/dL   Total Protein 7.0 6.5 - 8.1 g/dL   Albumin 4.4 3.5 - 5.0 g/dL   AST 8 (L) 15 - 41 U/L   ALT 13 0 - 44 U/L   Alkaline Phosphatase 118 38 - 126 U/L   Total Bilirubin 0.5 0.3 - 1.2 mg/dL   GFR calc non Af Amer 4 (L) >60 mL/min   GFR calc Af Amer 5 (L) >60 mL/min   Anion gap NOT CALCULATED 5 - 15    Comment: Performed at [facility], [place] [address] [place], [place] [address]  Troponin I - ONCE - STAT     Status: None   Collection Time: 01/29/19 12:45 PM  Result Value Ref Range   Troponin I <0.03 <0.03 ng/mL    Comment: Performed at [facility], [place] [address] [place], [place] [address]  Magnesium     Status: Abnormal   Collection Time: 01/29/19 12:45 PM  Result Value Ref Range   Magnesium 1.5 (L) 1.7 - 2.4 mg/dL    Comment: Performed at [facility] [address], [place],  [address]  POCT I-Stat EG7     Status: Abnormal   Collection Time: 01/29/19  2:05 PM  Result Value Ref Range   pH, Ven 6.921 (LL) 7.250 - 7.430   pCO2, Ven 19.7 (LL) 44.0 - 60.0 mmHg   pO2, Ven 71.0 (H) 32.0 - 45.0 mmHg   Bicarbonate 4.0 (L) 20.0 - 28.0 mmol/L   TCO2 <5 (L) 22 - 32 mmol/L   O2 Saturation 80.0 %   Acid-base deficit 27.0 (H) 0.0 - 2.0 mmol/L   Sodium 136 135 - 145 mmol/L   Potassium 6.6 (HH) 3.5 - 5.1 mmol/L   Calcium, Ion 1.32 1.15 - 1.40 mmol/L   HCT 32.0 (L) 39.0 - 52.0 %   Hemoglobin 10.9 (L) 13.0 - 17.0 g/dL   Patient temperature HIDE    Sample type VENOUS    Comment NOTIFIED PHYSICIAN     Dg Chest Port 1 View  Result Date: 01/29/2019 CLINICAL DATA:  Shortness of breath for 1 day. EXAM: PORTABLE CHEST 1 VIEW COMPARISON:  None. FINDINGS: Cardiomediastinal silhouette is normal. Mediastinal  contours appear intact. There is no evidence of focal airspace consolidation, pleural effusion or pneumothorax. Osseous structures are without acute abnormality. Soft tissues are grossly normal. IMPRESSION: No active disease. Electronically Signed   By: Fidela Salisbury M.D.   On: 01/29/2019 13:43    ROS Blood pressure (!) 154/88, pulse 98, temperature 97.6 F (36.4 C), temperature source Oral, resp. rate (!) 23, height 5\' 5"  (1.651 m), weight 62.1 kg, SpO2 97 %. Physical Exam Physical Examination: General appearance - chronically ill appearing and cachectic Mental status - alert, oriented to person, place, and time, anxious Eyes - DM retinopathy Mouth - reddened, dry Neck - adenopathy noted PCL Lymphatics - posterior cervical nodes Chest - rales in bases Heart - S1 and S2 normal, systolic murmur XJ8/8 at 2nd left intercostal space Abdomen - soft, nontender,pos bs Extremities - peripheral pulses normal, no pedal edema, no clubbing or cyanosis, AVG L FA Skin - very dry  Assessment/Plan: 1 ESRD  Low GFR at D/c. Worsened by D, vol depletion NAGA from D and renal failure.  Needs dialysis.  Not sure why was left on full dose immunosuppressive which may worsen his D.  Need COVID first. 2 Diarrhea  ? Infectious, hx Enterotox E. Coli. ?  Related to Mycophenolat 3 Hypertension: not an issue 4. Anemia of ESRD:  eval 5. Metabolic Bone Disease: check PTH,  6 Failed REnal tx. Will taper meds 7 NAGA D and Renal failure 8 DM needs control P bicarb, HD, esa, check Fe, PTH, COVID, Hep studies. CLIP when stable. Control DM  [name] 01/29/2019, 4:07 PM

## 2019-01-29 NOTE — ED Triage Notes (Addendum)
Pt here with c/o SHOB and diarrhea. Pt reports having a kidney transplant 6 years ago though occasionally has to receive hemodialysis.

## 2019-01-29 NOTE — Progress Notes (Signed)
PCCM Interval Note  Patient assessed and determined to have decision making capacity. At this time, he declines to having vas cath placed. He is able to repeat back that declining dialysis for his profound metabolic acidosis may potentially put him at risk for electrolyte abnormalities that can lead to complications including cardiac arrhythmia and even death.   After asking multiple times, patient refuses procedures including dialysis. However he does state he would want CPR and intubation if warranted.  Plan was discussed with PCCM-NP, Nephrology. Wife also updated via phone. She wishes to speak to patient via E-link  Rodman Pickle, M.D. [facility] Pulmonary/Critical Care Medicine Pager: [phone] After hours pager: [phone]

## 2019-01-30 ENCOUNTER — Inpatient Hospital Stay (HOSPITAL_COMMUNITY): Payer: Medicare Other

## 2019-01-30 ENCOUNTER — Encounter: Payer: Self-pay | Admitting: Family

## 2019-01-30 DIAGNOSIS — E43 Unspecified severe protein-calorie malnutrition: Secondary | ICD-10-CM | POA: Insufficient documentation

## 2019-01-30 DIAGNOSIS — R197 Diarrhea, unspecified: Secondary | ICD-10-CM

## 2019-01-30 LAB — BLOOD GAS, ARTERIAL
Acid-base deficit: 19.4 mmol/L — ABNORMAL HIGH (ref 0.0–2.0)
Bicarbonate: 6.3 mmol/L — ABNORMAL LOW (ref 20.0–28.0)
Drawn by: 345601
O2 Content: 2 L/min
O2 Saturation: 98.5 %
Patient temperature: 98.6
pH, Arterial: 7.305 — ABNORMAL LOW (ref 7.350–7.450)
pO2, Arterial: 156 mmHg — ABNORMAL HIGH (ref 83.0–108.0)

## 2019-01-30 LAB — RENAL FUNCTION PANEL
Albumin: 3 g/dL — ABNORMAL LOW (ref 3.5–5.0)
Anion gap: 16 — ABNORMAL HIGH (ref 5–15)
BUN: 93 mg/dL — ABNORMAL HIGH (ref 6–20)
CO2: 17 mmol/L — ABNORMAL LOW (ref 22–32)
Calcium: 7.7 mg/dL — ABNORMAL LOW (ref 8.9–10.3)
Chloride: 107 mmol/L (ref 98–111)
Creatinine, Ser: 9.81 mg/dL — ABNORMAL HIGH (ref 0.61–1.24)
GFR calc Af Amer: 6 mL/min — ABNORMAL LOW (ref >60)
GFR calc non Af Amer: 6 mL/min — ABNORMAL LOW (ref >60)
Glucose, Bld: 146 mg/dL — ABNORMAL HIGH (ref 70–99)
Phosphorus: 6.1 mg/dL — ABNORMAL HIGH (ref 2.5–4.6)
Potassium: 3.8 mmol/L (ref 3.5–5.1)
Sodium: 140 mmol/L (ref 135–145)

## 2019-01-30 LAB — HEPATITIS B SURFACE ANTIGEN: Hepatitis B Surface Ag: NEGATIVE

## 2019-01-30 LAB — GASTROINTESTINAL PANEL BY PCR, STOOL (REPLACES STOOL CULTURE)

## 2019-01-30 LAB — CBC
HCT: 18.5 % — ABNORMAL LOW (ref 39.0–52.0)
HCT: 18.3 % — ABNORMAL LOW (ref 39.0–52.0)
Hemoglobin: 5.9 g/dL — CL (ref 13.0–17.0)
Hemoglobin: 5.9 g/dL — CL (ref 13.0–17.0)
MCH: 26.8 pg (ref 26.0–34.0)
MCH: 26.9 pg (ref 26.0–34.0)
MCHC: 31.9 g/dL (ref 30.0–36.0)
MCHC: 32.2 g/dL (ref 30.0–36.0)
MCV: 84.1 fL (ref 80.0–100.0)
MCV: 83.6 fL (ref 80.0–100.0)
Platelets: 78 10*3/uL — ABNORMAL LOW (ref 150–400)
Platelets: 81 10*3/uL — ABNORMAL LOW (ref 150–400)
RBC: 2.2 MIL/uL — ABNORMAL LOW (ref 4.22–5.81)
RBC: 2.19 MIL/uL — ABNORMAL LOW (ref 4.22–5.81)
RDW: 16.9 % — ABNORMAL HIGH (ref 11.5–15.5)
RDW: 16.6 % — ABNORMAL HIGH (ref 11.5–15.5)
WBC: 9.5 10*3/uL (ref 4.0–10.5)
WBC: 9.5 10*3/uL (ref 4.0–10.5)
nRBC: 0 % (ref 0.0–0.2)
nRBC: 0 % (ref 0.0–0.2)

## 2019-01-30 LAB — GLUCOSE, CAPILLARY
Glucose-Capillary: 139 mg/dL — ABNORMAL HIGH (ref 70–99)
Glucose-Capillary: 141 mg/dL — ABNORMAL HIGH (ref 70–99)
Glucose-Capillary: 143 mg/dL — ABNORMAL HIGH (ref 70–99)
Glucose-Capillary: 146 mg/dL — ABNORMAL HIGH (ref 70–99)
Glucose-Capillary: 190 mg/dL — ABNORMAL HIGH (ref 70–99)
Glucose-Capillary: 89 mg/dL (ref 70–99)

## 2019-01-30 LAB — PARATHYROID HORMONE, INTACT (NO CA): PTH: 17 pg/mL (ref 15–65)

## 2019-01-30 LAB — BASIC METABOLIC PANEL
Anion gap: 20 — ABNORMAL HIGH (ref 5–15)
BUN: 91 mg/dL — ABNORMAL HIGH (ref 6–20)
CO2: 12 mmol/L — ABNORMAL LOW (ref 22–32)
Calcium: 7.8 mg/dL — ABNORMAL LOW (ref 8.9–10.3)
Chloride: 107 mmol/L (ref 98–111)
Creatinine, Ser: 10.06 mg/dL — ABNORMAL HIGH (ref 0.61–1.24)
GFR calc Af Amer: 6 mL/min — ABNORMAL LOW (ref >60)
GFR calc non Af Amer: 5 mL/min — ABNORMAL LOW (ref >60)
Glucose, Bld: 205 mg/dL — ABNORMAL HIGH (ref 70–99)
Potassium: 4.1 mmol/L (ref 3.5–5.1)
Sodium: 139 mmol/L (ref 135–145)

## 2019-01-30 LAB — HCV COMMENT:

## 2019-01-30 LAB — HEPATITIS B SURFACE ANTIBODY,QUALITATIVE: Hep B S Ab: NONREACTIVE

## 2019-01-30 LAB — APTT: aPTT: 34 seconds (ref 24–36)

## 2019-01-30 LAB — TACROLIMUS LEVEL: Tacrolimus (FK506) - LabCorp: 4.2 ng/mL (ref 2.0–20.0)

## 2019-01-30 LAB — HEPATITIS C ANTIBODY (REFLEX): HCV Ab: <0.1 s/co ratio (ref 0.0–0.9)

## 2019-01-30 LAB — MRSA PCR SCREENING: MRSA by PCR: NEGATIVE

## 2019-01-30 LAB — PREPARE RBC (CROSSMATCH)

## 2019-01-30 LAB — MAGNESIUM: Magnesium: 1.1 mg/dL — ABNORMAL LOW (ref 1.7–2.4)

## 2019-01-30 MED ORDER — RENA-VITE PO TABS
1.0000 | ORAL_TABLET | Freq: Every day | ORAL | Status: DC
  Administered 2019-02-01: 1 via ORAL
  Filled 2019-01-30 (×4): qty 1

## 2019-01-30 MED ORDER — CHLORHEXIDINE GLUCONATE CLOTH 2 % EX PADS
6.0000 | MEDICATED_PAD | Freq: Every day | CUTANEOUS | Status: DC
  Administered 2019-01-31: 6 via TOPICAL

## 2019-01-30 MED ORDER — INSULIN ASPART 100 UNIT/ML ~~LOC~~ SOLN
0.0000 [IU] | Freq: Three times a day (TID) | SUBCUTANEOUS | Status: DC
  Administered 2019-01-30: 2 [IU] via SUBCUTANEOUS
  Administered 2019-01-31 – 2019-02-01 (×4): 3 [IU] via SUBCUTANEOUS
  Administered 2019-02-02: 2 [IU] via SUBCUTANEOUS
  Administered 2019-02-03 – 2019-02-04 (×2): 3 [IU] via SUBCUTANEOUS
  Administered 2019-02-04 – 2019-02-05 (×3): 2 [IU] via SUBCUTANEOUS

## 2019-01-30 MED ORDER — INSULIN ASPART 100 UNIT/ML ~~LOC~~ SOLN: 0.0000 [IU] | Freq: Every day | SUBCUTANEOUS | Status: DC

## 2019-01-30 MED ORDER — SODIUM CHLORIDE 0.9% IV SOLUTION
Freq: Once | INTRAVENOUS | Status: AC
  Administered 2019-01-30: 19:00:00 via INTRAVENOUS

## 2019-01-30 MED ORDER — NEPRO/CARBSTEADY PO LIQD
237.0000 mL | Freq: Three times a day (TID) | ORAL | Status: DC
  Administered 2019-01-31 – 2019-02-02 (×4): 237 mL via ORAL
  Filled 2019-01-30 (×17): qty 237

## 2019-01-30 MED ORDER — HEPARIN SODIUM (PORCINE) 5000 UNIT/ML IJ SOLN
5000.0000 [IU] | Freq: Three times a day (TID) | INTRAMUSCULAR | Status: DC
  Administered 2019-01-30 – 2019-01-31 (×2): 5000 [IU] via SUBCUTANEOUS
  Filled 2019-01-30 (×8): qty 1

## 2019-01-30 MED ORDER — MAGNESIUM SULFATE 4 GM/100ML IV SOLN
4.0000 g | Freq: Once | INTRAVENOUS | Status: AC
  Administered 2019-01-30: 4 g via INTRAVENOUS
  Filled 2019-01-30: qty 100

## 2019-01-30 NOTE — Progress Notes (Signed)
Subjective: Interval History: agrees to HD.  Objective: Vital signs in last 24 hours: Temp:  [97.6 F (36.4 C)-98.8 F (37.1 C)] 98.8 F (37.1 C) (06/16 0300) Pulse Rate:  [98-114] 101 (06/16 0800) Resp:  [16-30] 16 (06/16 0800) BP: (106-154)/(56-138) 108/67 (06/16 0800) SpO2:  [90 %-100 %] 100 % (06/16 0800) Weight:  [51.2 kg-62.1 kg] 51.2 kg (06/16 0500) Weight change:   Intake/Output from previous day: 06/15 0701 - 06/16 0700 In: 2880.2 [I.V.:1802.8; IV Piggyback:1077.4] Out: 0  Intake/Output this shift: Total I/O In: 300.1 [I.V.:300.1] Out: -   General appearance: cooperative, cachectic, pale and slowed mentation Chest wall: no tenderness Cardio: S1, S2 normal and systolic murmur: systolic ejection 2/6, crescendo and decrescendo at 2nd left intercostal space GI: pos bs, TX RLQ,,soft Extremities: AVG LFA  Lab Results: Recent Labs    01/29/19 1245 01/29/19 1405  WBC 26.2*  --   HGB 9.2* 10.9*  HCT 30.2* 32.0*  PLT 127*  --    BMET:  Recent Labs    01/30/19 0008 01/30/19 0351  NA 139 140  K 4.1 3.8  CL 107 107  CO2 12* 17*  GLUCOSE 205* 146*  BUN 91* 93*  CREATININE 10.06* 9.81*  CALCIUM 7.8* 7.7*   No results for input(s): PTH in the last 72 hours. Iron Studies:  Recent Labs    01/29/19 1624  IRON 187*  TIBC 237*    Studies/Results: Ct Abdomen Pelvis Wo Contrast  Result Date: 01/29/2019 CLINICAL DATA:  Shortness of breath and diarrhea. Status post kidney transplant 6 years ago. EXAM: CT ABDOMEN AND PELVIS WITHOUT CONTRAST TECHNIQUE: Multidetector CT imaging of the abdomen and pelvis was performed following the standard protocol without IV contrast. COMPARISON:  12/25/2018 FINDINGS: Lower chest: Examination is severely limited by motion artifact. Given this limitation no definite abnormality was detected involving the lower lobes. Hepatobiliary: The liver is unremarkable. The gallbladder is significantly distended. There appears to be some  gallbladder sludge. Pancreas: No significant abnormality, however evaluation is limited by motion artifact. Spleen: Normal in size without focal abnormality. Adrenals/Urinary Tract: The native kidneys are atrophic with multiple small calcifications. The adrenal glands are not well evaluated secondary to significant motion artifact. There is a transplant kidney in the left pelvis without evidence of hydronephrosis. The bladder is unremarkable. Stomach/Bowel: There is some mild diffuse wall thickening of the sigmoid colon. There is likely liquid stool throughout the colon. The appendix appears unremarkable but is suboptimally evaluated secondary to extensive motion artifact. No evidence of a small-bowel obstruction. The stomach is unremarkable. Vascular/Lymphatic: Aortic atherosclerosis. No enlarged abdominal or pelvic lymph nodes. Reproductive: Prostate is unremarkable. Other: No abdominal wall hernia or abnormality. No abdominopelvic ascites. Musculoskeletal: No fracture is seen. IMPRESSION: 1. Very limited study secondary to extensive motion artifact. 2. Wall thickening of the sigmoid colon with liquid stool throughout the colon. Findings may be secondary to an infectious or inflammatory colitis. 3. Transplant kidney in the left pelvis without evidence of hydronephrosis. 4. Very distended gallbladder, not significantly changed from 12/25/2018. If there is clinical suspicion for acute cholecystitis, follow-up with ultrasound is recommended. There is likely gallbladder sludge. Electronically Signed   By: Constance Holster M.D.   On: 01/29/2019 17:01   Dg Chest Port 1 View  Result Date: 01/30/2019 CLINICAL DATA:  Dyspnea. EXAM: PORTABLE CHEST 1 VIEW COMPARISON:  Single-view of the chest 01/29/2019. PA and lateral chest 01/18/2018. FINDINGS: Lungs clear. Heart size normal. No pneumothorax or pleural fluid. No bony abnormality. IMPRESSION: Normal  chest. Electronically Signed   By: Inge Rise M.D.   On:  01/30/2019 07:59   Dg Chest Port 1 View  Result Date: 01/29/2019 CLINICAL DATA:  Shortness of breath for 1 day. EXAM: PORTABLE CHEST 1 VIEW COMPARISON:  None. FINDINGS: Cardiomediastinal silhouette is normal. Mediastinal contours appear intact. There is no evidence of focal airspace consolidation, pleural effusion or pneumothorax. Osseous structures are without acute abnormality. Soft tissues are grossly normal. IMPRESSION: No active disease. Electronically Signed   By: Fidela Salisbury M.D.   On: 01/29/2019 13:43    I have reviewed the patient's current medications.  Assessment/Plan: 1 ESRD needs to start HD. BP ok, will try conventional HD. Acidemia better. K ok. 2 Failed TX taper meds 3 Anemia esa, Fe ok 4 HPTH ok 5 D needs eval 6 Noncompliance P HD, esa, D w/u.      LOS: 1 day   Jeneen Rinks [place]land Crehan 01/30/2019,8:09 AM

## 2019-01-30 NOTE — Progress Notes (Signed)
Assisted tele visit to patient with wife.  Nishita Isaacks R, RN  

## 2019-01-30 NOTE — Progress Notes (Signed)
Anemia, worse today.  No signs of bleeding.  I wonder if the admission labs were either hemoconcentrated or incorrect.  Regardless, warrants a unit transfusion given borderline pressures, discussed risks/benefits with patient and he is agreeable.

## 2019-01-30 NOTE — Progress Notes (Signed)
CRITICAL VALUE ALERT  Critical Value:  Hgb 5.9  Date & Time Notied:  6/16, 1630  Provider Notified: Charlsie Quest, MD  Orders Received/Actions taken: 1 unit blood transfusion orderd  Donnetta Simpers, RN

## 2019-01-30 NOTE — Progress Notes (Signed)
This encounter was created in error - please disregard.

## 2019-01-30 NOTE — Progress Notes (Signed)
Assisted tele visit to patient with family member.  Jlee Harkless William, RN   

## 2019-01-30 NOTE — Progress Notes (Signed)
Initial Nutrition Assessment  DOCUMENTATION CODES:   Severe malnutrition in context of chronic illness  INTERVENTION:    Nepro Shake po TID, each supplement provides 425 kcal and 19 grams protein   Continue Rena-vit daily  NUTRITION DIAGNOSIS:   Severe Malnutrition related to chronic illness(CKD) as evidenced by severe fat depletion, severe muscle depletion, percent weight loss(9% weight loss within 1 month).  GOAL:   Patient will meet greater than or equal to 90% of their needs  MONITOR:   PO intake, Supplement acceptance, Labs  REASON FOR ASSESSMENT:   Malnutrition Screening Tool    ASSESSMENT:   48 yo male admitted with acute on chronic renal failure, diarrhea, leukocytosis. PMH of DM, HTN, ESRD S/P kidney transplant 2015 now with CKD, medical noncompliance, B-12 deficiency, anemia.  Patient reports good appetite and good intake PTA. He is not a picky eater; he will eat most anything. He usually eats 3 meals per day. He says he has probably lost weight, but unsure how much. He agreed to try Nepro supplements to maximize intake of protein and calories.    Labs reviewed. BUN 93 (H), creatinine 9.81 (H), Phosphorus 6.1 (H), Magnesium 1.1 (L) CBG's: [phone]  Medications reviewed and include Novolog, Rena-vit.  Per review of weight encounters, patient has lost 9% of usual weight within the past month, 56.4 kg (01/03/19) to 51.2 kg (today).  Patient starting conventional HD this admission.   NUTRITION - FOCUSED PHYSICAL EXAM:    Most Recent Value  Orbital Region  Moderate depletion  Upper Arm Region  Severe depletion  Thoracic and Lumbar Region  Severe depletion  Buccal Region  Moderate depletion  Temple Region  Moderate depletion  Clavicle Bone Region  Moderate depletion  Clavicle and Acromion Bone Region  Moderate depletion  Scapular Bone Region  Moderate depletion  Dorsal Hand  Moderate depletion  Patellar Region  Severe depletion  Anterior Thigh Region   Severe depletion  Posterior Calf Region  Severe depletion  Edema (RD Assessment)  None  Hair  Reviewed  Eyes  Reviewed  Mouth  Reviewed  Skin  Reviewed  Nails  Reviewed       Diet Order:   Diet Order            Diet renal/carb modified with fluid restriction Diet-HS Snack? Nothing; Fluid restriction: 1200 mL Fluid; Room service appropriate? Yes; Fluid consistency: Thin  Diet effective now              EDUCATION NEEDS:   Not appropriate for education at this time  Skin:  Skin Assessment: Skin Integrity Issues: Skin Integrity Issues:: Stage II Stage II: buttocks x 3  Last BM:  6/16 (type 7)  Height:   Ht Readings from Last 1 Encounters:  01/29/19 5\' 5"  (1.651 m)    Weight:   Wt Readings from Last 1 Encounters:  01/30/19 51.2 kg    Ideal Body Weight:  61.8 kg  BMI:  Body mass index is 18.78 kg/m.  Estimated Nutritional Needs:   Kcal:  6440-3474  Protein:  75-90 gm  Fluid:  1 L +UOP    Molli Barrows, RD, LDN, [place] Pager [phone] After Hours Pager [phone]

## 2019-01-30 NOTE — H&P (Signed)
NAME:  Greggory Safranek, MRN:  [id number], DOB:  03/12/1971, LOS: 0 ADMISSION DATE:  01/29/2019, CONSULTATION DATE:  6/15 REFERRING MD:  Ruthann Cancer, CHIEF COMPLAINT:  Metabolic disarray    Brief History   48 year old male w/ ESRD s/p renal x-plant 2015. Presents w/ cc: 2-3wk h/o watery diarrhea abd cramping and 24 hrs of worsening SOB. Dx eval in ER w/ acute on chronic renal failure and profound anion gap metabolic acidosis (ph 6.9, bicarb 19.7, K 6.6) . PCCM asked to admit.   History of present illness   48 year old patient with history as mentioned below presents emergency room on 6/15 with chief complaint of diarrhea w/ associated abd cramping (approx 1 month) w/ recurrence of  shortness of breath over last 24 hrs.  No nausea or vomiting.  No cough fever.  No recent sick exposures.  Diagnostic evaluation to date:  White blood cell count 26.2, K 6.6, scr 11.82 (baseline ~4), CO2 <7, anion gap to high to calculate w/ glucose of 349.   Past Medical History  End-stage renal disease, stage III, prior renal transplant (received deceased donor renal transplant 2015 which was his second transplant), diabetes type 2, chronic anemia, hyperlipidemia.  Medical noncompliance.  History of not taking insulin or antirejection medications in the past.  Baseline serum creatinine ranging in the 2-3 range but recently peaked at 10.8 Dec 25 2018. Abg: 6.9/19.7/71/TCO2 <5. PCCM asked to admit   [facility] Events   6/15: admitted w/ cc: diarrhea,  leukocytosis, acute on chronic renal failure w/ profound anion gap metabolic acidosis and hyperkalemia   Consults:  Renal consulted 6/15  Procedures:    Significant Diagnostic Tests:  CT abd/pelvis 6/15 IMPRESSION: 1. Very limited study secondary to extensive motion artifact. 2. Wall thickening of the sigmoid colon with liquid stool throughout the colon. Findings may be secondary to an infectious or inflammatory colitis. 3. Transplant kidney in the  left pelvis without evidence of hydronephrosis. 4. Very distended gallbladder, not significantly changed from 12/25/2018. If there is clinical suspicion for acute cholecystitis, follow-up with ultrasound is recommended. There is likely gallbladder sludge.  Micro Data:  Cdiff 6/15 neg Stool culture 6/15 pending, prior stool cultures 5/11 benign BC 6/15 NGTD MRSA Neg  Antimicrobials:  Vanc 6/15 Zosyn 6/15>>>   Interim history/subjective:  Looks pretty comfortable today, wants some food.  Still loose BM, C diff neg.  Objective   Blood pressure (!) 107/58, pulse 100, temperature 98.8 F (37.1 C), temperature source Oral, resp. rate 17, height 5\' 5"  (1.651 m), weight 51.2 kg, SpO2 93 %.        Intake/Output Summary (Last 24 hours) at 01/30/2019 0804 Last data filed at 01/30/2019 0600 Gross per 24 hour  Intake 2880.17 ml  Output 0 ml  Net 2880.17 ml   Filed Weights   01/29/19 1219 01/30/19 0500  Weight: 62.1 kg 51.2 kg   Physical Exam: GEN: chronically ill appearing man in NAD HEENT: MMM, trachea midline CV: RRR, ext warm PULM: CTAB, no accessory muscle use GI: Soft, +BS EXT: + muscle wasting, no edema NEURO: Moves all 4 ext to command PSYCH: AOx3, fair insight SKIN: No rashes, AV graft with good thrill  Resolved Hospital Problem list   Dyspnea related to acidosis Hyperkalemia  Assessment & Plan:  # ESRD post transplant 2015 now with persistent gastroenteritis symptoms leading to acute kidney failure.  Initial etiology related to DM.  Had admission in May with GI symptoms found to be in DKA, Cr  at DC at that time was 4.  No signs of rejection on biopsy in May.  On PTA tacro, cellcept, steroids. # Thrombocytopenia- chronic NTD # HTN- pressures soft, PTA meds on hold # DM- + gap, no ketones in urine, sugars better # Leukocytosis- MRSA swab neg, CXR clear, procal minimally elevated, has some GI symptoms, not sure what we are treating with zosyn # CHF- looks dry to  euvolemic at present  - Plan today to start conventional HD at bedside - Check WBC/Plts, continue zosyn for now, f/u remaining stool studies, ?chronic pancreatitis vs. CMV associated vs. Tacro/cellcept-associated, may need to bring GI on board for studies needed, start with fecal fat and consideration of empiric creon - Switch to SSI - Continue PTA prograf, myfortic, dapsone PPx - If tolerates HD should be fine for floor  Best practice:  Diet: start renal diabetic Pain/Anxiety/Delirium protocol (if indicated): n/a VAP protocol (if indicated): n/a DVT prophylaxis: SCD, heparin  GI prophylaxis: na Glucose control: insulin ACHS Mobility: up to chair after HD Code Status: Full  Family Communication: patient updated  Disposition: ICU, if tolerates HD, transfer to floor  Erskine Emery MD [facility] 01/30/19 8:41 AM Personal pager: [phone] If unanswered, please page CCM On-call: [phone]

## 2019-01-30 NOTE — Progress Notes (Signed)
CRITICAL VALUE ALERT  Critical Value: Hgb 5.9  Date & Time Notied:  01/30/19, 1135  Provider Notified: Ina Homes, MD, [place]  Orders Received/Actions taken: lab re-draw ordered  Donnetta Simpers, RN

## 2019-01-31 DIAGNOSIS — K529 Noninfective gastroenteritis and colitis, unspecified: Secondary | ICD-10-CM

## 2019-01-31 LAB — BPAM RBC
Blood Product Expiration Date: 202007152359
ISSUE DATE / TIME: 202006161818
Unit Type and Rh: 5100

## 2019-01-31 LAB — CBC
HCT: 22.8 % — ABNORMAL LOW (ref 39.0–52.0)
Hemoglobin: 7.7 g/dL — ABNORMAL LOW (ref 13.0–17.0)
MCH: 28 pg (ref 26.0–34.0)
MCHC: 33.8 g/dL (ref 30.0–36.0)
MCV: 82.9 fL (ref 80.0–100.0)
Platelets: 71 10*3/uL — ABNORMAL LOW (ref 150–400)
RBC: 2.75 MIL/uL — ABNORMAL LOW (ref 4.22–5.81)
RDW: 15.3 % (ref 11.5–15.5)
WBC: 8.7 10*3/uL (ref 4.0–10.5)
nRBC: 0 % (ref 0.0–0.2)

## 2019-01-31 LAB — BASIC METABOLIC PANEL
Anion gap: 13 (ref 5–15)
BUN: 51 mg/dL — ABNORMAL HIGH (ref 6–20)
CO2: 22 mmol/L (ref 22–32)
Calcium: 7.3 mg/dL — ABNORMAL LOW (ref 8.9–10.3)
Chloride: 104 mmol/L (ref 98–111)
Creatinine, Ser: 6.99 mg/dL — ABNORMAL HIGH (ref 0.61–1.24)
GFR calc Af Amer: 10 mL/min — ABNORMAL LOW (ref >60)
GFR calc non Af Amer: 8 mL/min — ABNORMAL LOW (ref >60)
Glucose, Bld: 199 mg/dL — ABNORMAL HIGH (ref 70–99)
Potassium: 3.4 mmol/L — ABNORMAL LOW (ref 3.5–5.1)
Sodium: 139 mmol/L (ref 135–145)

## 2019-01-31 LAB — GLUCOSE, CAPILLARY
Glucose-Capillary: 118 mg/dL — ABNORMAL HIGH (ref 70–99)
Glucose-Capillary: 139 mg/dL — ABNORMAL HIGH (ref 70–99)
Glucose-Capillary: 144 mg/dL — ABNORMAL HIGH (ref 70–99)
Glucose-Capillary: 151 mg/dL — ABNORMAL HIGH (ref 70–99)
Glucose-Capillary: 160 mg/dL — ABNORMAL HIGH (ref 70–99)
Glucose-Capillary: 168 mg/dL — ABNORMAL HIGH (ref 70–99)

## 2019-01-31 LAB — TYPE AND SCREEN
ABO/RH(D): O POS
Antibody Screen: NEGATIVE
Unit division: 0

## 2019-01-31 MED ORDER — ONDANSETRON HCL 4 MG/2ML IJ SOLN
4.0000 mg | Freq: Four times a day (QID) | INTRAMUSCULAR | Status: DC
  Administered 2019-01-31: 4 mg via INTRAVENOUS

## 2019-01-31 MED ORDER — PEG 3350-KCL-NA BICARB-NACL 420 G PO SOLR
4000.0000 mL | Freq: Once | ORAL | Status: AC
  Administered 2019-01-31: 10:00:00 4000 mL via ORAL
  Filled 2019-01-31: qty 4000

## 2019-01-31 MED ORDER — CHLORHEXIDINE GLUCONATE CLOTH 2 % EX PADS: 6.0000 | MEDICATED_PAD | Freq: Every day | CUTANEOUS | Status: DC

## 2019-01-31 MED ORDER — SODIUM CHLORIDE 0.9 % IV SOLN
INTRAVENOUS | Status: DC
  Administered 2019-02-01: 11:00:00 via INTRAVENOUS

## 2019-01-31 MED ORDER — MIDODRINE HCL 5 MG PO TABS
5.0000 mg | ORAL_TABLET | Freq: Three times a day (TID) | ORAL | Status: DC
  Administered 2019-01-31 – 2019-02-05 (×14): 5 mg via ORAL
  Filled 2019-01-31 (×14): qty 1

## 2019-01-31 MED ORDER — SACCHAROMYCES BOULARDII 250 MG PO CAPS
250.0000 mg | ORAL_CAPSULE | Freq: Two times a day (BID) | ORAL | Status: DC
  Administered 2019-02-01 – 2019-02-05 (×3): 250 mg via ORAL
  Filled 2019-01-31 (×7): qty 1

## 2019-01-31 MED ORDER — DIPHENOXYLATE-ATROPINE 2.5-0.025 MG PO TABS
1.0000 | ORAL_TABLET | Freq: Four times a day (QID) | ORAL | Status: DC
  Administered 2019-01-31 – 2019-02-05 (×11): 1 via ORAL
  Filled 2019-01-31 (×12): qty 1

## 2019-01-31 MED ORDER — ONDANSETRON HCL 4 MG/2ML IJ SOLN
4.0000 mg | Freq: Once | INTRAMUSCULAR | Status: AC
  Administered 2019-01-31: 4 mg via INTRAVENOUS
  Filled 2019-01-31: qty 2

## 2019-01-31 NOTE — Progress Notes (Signed)
NEW ADMISSION NOTE New Admission Note:   Arrival Method: Patient arrived in w/c. Mental Orientation: Alert and oriented x 4. Telemetry:  7M-17 NSR. Assessment: Completed Skin:  MASD and stage 2 on the sacrum. IV: Right Hand saline lock. Pain: Denies any pain. Tubes: N/A Safety Measures: Safety Fall Prevention Plan has been given, discussed and signed Admission: Completed [facility] Orientation: Patient has been orientated to the room, unit and staff.  Family:  Orders have been reviewed and implemented. Will continue to monitor the patient. Call light has been placed within reach and bed alarm has been activated.   Amaryllis Dyke, RN

## 2019-01-31 NOTE — Progress Notes (Addendum)
NAME:  [name], MRN:  [id number], DOB:  1971-02-14, LOS: 0 ADMISSION DATE:  01/29/2019, CONSULTATION DATE:  6/15 REFERRING MD:  Ruthann Cancer, CHIEF COMPLAINT:  Metabolic disarray    Brief History   48 year old male w/ ESRD s/p renal x-plant 2015. Presents w/ cc: 2-3wk h/o watery diarrhea abd cramping and 24 hrs of worsening SOB. Dx eval in ER w/ acute on chronic renal failure and profound anion gap metabolic acidosis (ph 6.9, bicarb 19.7, K 6.6) . PCCM asked to admit.   History of present illness   48 year old patient with history as mentioned below presents emergency room on 6/15 with chief complaint of diarrhea w/ associated abd cramping (approx 1 month) w/ recurrence of  shortness of breath over last 24 hrs.  No nausea or vomiting.  No cough fever.  No recent sick exposures.  Diagnostic evaluation to date:  White blood cell count 26.2, K 6.6, scr 11.82 (baseline ~4), CO2 <7, anion gap to high to calculate w/ glucose of 349.   Past Medical History  End-stage renal disease, stage III, prior renal transplant (received deceased donor renal transplant 2015 which was his second transplant), diabetes type 2, chronic anemia, hyperlipidemia.  Medical noncompliance.  History of not taking insulin or antirejection medications in the past.  Baseline serum creatinine ranging in the 2-3 range but recently peaked at 10.8 Dec 25 2018. Abg: 6.9/19.7/71/TCO2 <5. PCCM asked to admit   [facility] Events   6/15: admitted w/ cc: diarrhea,  leukocytosis, acute on chronic renal failure w/ profound anion gap metabolic acidosis and hyperkalemia  6/16 had HD tolerated well, H/H low, transfused 1 unit  Consults:  Renal consulted 6/15 PT/OT 6/16  Procedures:    Significant Diagnostic Tests:  CT abd/pelvis 6/15 IMPRESSION: 1. Very limited study secondary to extensive motion artifact. 2. Wall thickening of the sigmoid colon with liquid stool throughout the colon. Findings may be secondary to an  infectious or inflammatory colitis. 3. Transplant kidney in the left pelvis without evidence of hydronephrosis. 4. Very distended gallbladder, not significantly changed from 12/25/2018. If there is clinical suspicion for acute cholecystitis, follow-up with ultrasound is recommended. There is likely gallbladder sludge.  Micro Data:  Cdiff 6/15 neg Stool culture 6/15 pending, prior stool cultures 5/11 benign BC 6/15 NGTD MRSA Neg  Antimicrobials:  Vanc 6/15 Zosyn 6/15>>> 6/17  Interim history/subjective:  Looks and feels good today.  Tolerating diet.  Had CRRT yesterday.  Refused labs but now willing to have them drawn.  Ongoing loose stools which is frustrating for him.  Objective   Blood pressure (!) 89/54, pulse 92, temperature 98.5 F (36.9 C), temperature source Oral, resp. rate 15, height 5\' 5"  (1.651 m), weight 51.3 kg, SpO2 100 %.        Intake/Output Summary (Last 24 hours) at 01/31/2019 0804 Last data filed at 01/30/2019 2120 Gross per 24 hour  Intake 330.18 ml  Output -150 ml  Net 480.18 ml   Filed Weights   01/30/19 1427 01/30/19 1737 01/31/19 0500  Weight: 51.2 kg 51.2 kg 51.3 kg   Physical Exam: GEN: chronically ill appearing man in NAD HEENT: MMM, trachea midline CV: RRR, ext warm PULM: CTAB, no accessory muscle use GI: Soft, +BS EXT: + muscle wasting, no edema NEURO: Moves all 4 ext to command PSYCH: AOx3, fair insight SKIN: No rashes, AV graft with good thrill  Resolved Hospital Problem list   Dyspnea related to acidosis Hyperkalemia  Assessment & Plan:  # ESRD  post transplant 2015 now with persistent gastroenteritis symptoms leading to acute kidney failure.  Initial etiology related to DM.  Had admission in May with GI symptoms found to be in DKA, Cr at DC at that time was 4.  No signs of rejection on biopsy in May.  On PTA tacro, cellcept, steroids. # Thrombocytopenia- chronic NTD # HTN- pressures soft, PTA meds on hold # DM- + gap, no ketones  in urine, sugars better # Leukocytosis- I do not think he has an infectious colitis, stool labs to date benign # CHF- looks dry to euvolemic at present # Diarrhea- ongoing issue for 1.5 months now, ?chronic pancreatitis vs. CMV associated vs. Abnormal recolonization in setting of abx use vs. Tacro/cellcept-associated.   # Soft Bps- chronic, suspicion for nephrogenic vasoplegia # Anemia, acute on chronic likely related to renal disease +/- slow GI losses   -Stop abx - iHD per nephrology - f/u fecal fat, question if trial of creon would be helpful - I am going to try  florastor plus lomotil for now and ask GI to weigh in to see what other workup needs to be considered, this is the second time he has been admitted related to dehyration/acidosis from this ongoing diarrhea - Continue PTA prograf, myfortic, dapsone PPx, tapering per nephrology - Floor today if H/H okay, will ask hospitalist to take over care, appreciate help  Best practice:  Diet: renal diabetic Pain/Anxiety/Delirium protocol (if indicated): n/a VAP protocol (if indicated): n/a DVT prophylaxis: SCD, heparin  GI prophylaxis: na Glucose control: insulin ACHS Mobility: up to chair if willing Code Status: Full  Family Communication: patient updated  Disposition: floor today if H/H stable  Erskine Emery MD [facility] 01/30/19 8:04 AM Personal pager: #[phone] If unanswered, please page CCM On-call: [phone]

## 2019-01-31 NOTE — Consult Note (Signed)
[facility] Consult  Referring Provider: Candee Furbish, MD/Critical care Primary Care Physician:  [name], NP Primary Gastroenterologist: Althia Forts  Reason for Consultation: Diarrhea  HPI: [name] is a 48 y.o. male was admitted on 01/29/2019 with diarrhea associated with shortness of breath. This is a pleasant African-American gentleman with history of renal transplant 6 years ago maintained on mycophenolate 720 mg BD and tacrolimus 6 mg, restarted on hemodialysis due to worsened renal function in 12/2018, along with history of diabetes mellitus type 2, hypertension, dyslipidemia, history of DKA, anemia and chronic kidney disease, congestive dilated cardiomyopathy, prolonged QT interval. He was admitted between 12/23/2020 01/03/2019 due to severe metabolic acidosis worsening diarrhea. Patient states that diarrhea has worsened in the last 3 weeks, described as almost 3-4 loose to watery stools during the day and up to 3 episodes of nocturnal diarrhea on a regular basis.  He has noted bright red blood in stool intermittently and often has mild generalized abdominal pain.  No prior colonoscopy. He also has multiple episodes of nausea and vomiting of bilious fluid.  He denies difficulty swallowing or pain on swallowing.  Patient reports trying over-the-counter Imodium for a few days which helps, but the diarrhea recurs when he stops Imodium. On this admission he was found to have profound metabolic acidosis with a pH of 6.9 and a blood glucose of 349. Stool studies are negative for C. difficile as well as GI pathogen panel. CT abdomen performed without contrast showed mild diffuse wall thickening of sigmoid with liquid stool throughout colon which may be infectious or inflammatory.  Incidentally a very distended gallbladder was also noted.   Past Medical History:  Diagnosis Date  . Anemia   . CKD (chronic kidney disease), stage III ([place])   . Diabetes mellitus 12/2008    A1C 8.3 12/23/08, 24hr urine protein 2793 mg/day, SPEP neg, proliferative BDR s/p photocoagulation 08/2009 done at [facility] and f/u by DR Groat  . ESRD (end stage renal disease) on dialysis ([place])    1.Initiated HD via right per cate 12/30/2008 (MWF schedule at [facility]) 2.Left upper extremity A-V fistula by Dr Oneida Alar 12/30/2008 3.Aemia: Received Venofer 12/30/08 Ferritin 179 % sat 12 12/23/08, 4. Secondary Hyperparathyroidism- PTH 188, P 5.4, Ca 9.2 12/2008  . Hemodialysis-associated hypotension    coreg d/c'd  . Herniated lumbar disc without myelopathy   . History of blood transfusion   . Hyperparathyroidism due to renal insufficiency ([place])   . Hypertension    resloved after initating HD, now with post HD hypotension  . Hypocalcemia   . Hypomagnesemia   . Kidney transplant as cause of abnormal reaction or later complication 6213   [facility]  . Non-ischemic cardiomyopathy ([place])    EF 20-25% 12/23/2008>>EF 40-45% 03/2009>>EF ??? 7/?/2011  . Occult blood positive stool   . Psoriasis    scalp  . Thrombocytopenia ([place])   . Vitamin B 12 deficiency     Past Surgical History:  Procedure Laterality Date  . AV FISTULA PLACEMENT     left  . AV FISTULA PLACEMENT  09/12/2012   Procedure: INSERTION OF ARTERIOVENOUS (AV) GORE-TEX GRAFT ARM;  Surgeon: Elam Dutch, MD;  Location: [place];  Service: Vascular;  Laterality: Left;  KIDNEY TRANSPLANT FAILED  . CATARACT EXTRACTION, BILATERAL    . EYE SURGERY    . INSERTION OF DIALYSIS CATHETER  07/14/2012   Procedure: INSERTION OF DIALYSIS CATHETER;  Surgeon: Mal Misty, MD;  Location: [place];  Service:  Vascular;  Laterality: Right;  Ultrasound Guided Insertion of Internal Jugular Dialysis Catheter  . IR THROMBECTOMY AV FISTULA W/THROMBOLYSIS/PTA INC/SHUNT/IMG LEFT Left 12/28/2018  . IR US GUIDE VASC ACCESS LEFT  12/28/2018  . 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 KIDNEY TRANSPLANT  12/2013   second kidney transplant   . lumbar disc sugery   10/17/2017  . LUMBAR DISC SURGERY    . NEPHRECTOMY TRANSPLANTED ORGAN  08/11/12  . PHOTOCOAGULATION     for diabetic retinopathy 08/1999  . SHUNTOGRAM N/A 07/11/2012   Procedure: SHUNTOGRAM;  Surgeon: Vance W Brabham, MD;  Location: [facility];  Service: Cardiovascular;  Laterality: N/A;    Prior to Admission medications   Medication Sig Start Date End Date Taking? Authorizing Provider  aspirin EC 81 MG tablet Take 81 mg by mouth daily.   Yes [provider]  atorvastatin (LIPITOR) 20 MG tablet Take one tablet by mouth at bedtime for cholesterol. NEEDS AN APPOINTMENT BEFORE ANYMORE FUTURE REFILLS. 01/11/19  Yes Eubanks, Jessica K, NP  carvedilol (COREG) 6.25 MG tablet Take 1 tablet (6.25 mg total) by mouth 2 (two) times daily with a meal. 12/20/17  Yes Eubanks, Jessica K, NP  Continuous Blood Gluc Receiver (DEXCOM RECEIVER KIT) DEVI 1 each by Does not apply route 3 (three) times daily. Use to test blood sugar three times daily. Dx: E11.9 08/03/18  Yes Eubanks, Jessica K, NP  Continuous Blood Gluc Sensor (DEXCOM G6 SENSOR) MISC 1 each by Does not apply route 3 (three) times daily. Use to test blood sugar three times daily. Dx: E11.9 08/03/18  Yes Eubanks, Jessica K, NP  Continuous Blood Gluc Transmit (DEXCOM G6 TRANSMITTER) MISC 1 each by Does not apply route 3 (three) times daily. Use to test blood sugar three times daily. Dx: E11.9 08/03/18  Yes Eubanks, Jessica K, NP  dapsone 25 MG tablet Take 50 mg by mouth daily.   Yes [provider]  folic acid (FOLVITE) 1 MG tablet Take 1 mg by mouth daily.  06/24/15  Yes [provider]  Insulin Pen Needle 31G X 5 MM MISC 5 Units/oz/day by Does not apply route at bedtime. Use 4 times daily to inject insulin to skin 12/07/17  Yes Mathews, Elizabeth G, MD  mycophenolate (MYFORTIC) 180 MG EC tablet Take 720 mg by mouth 2 (two) times daily.    Yes [provider]  NOVOLOG FLEXPEN 100 UNIT/ML FlexPen  INJECT 12 UNITS INTO THE SKIN 3 TIMES DAILY BEFORE MEALS. Patient taking differently: Inject 1-10 Units into the skin 3 (three) times daily. Per sliding scale. 10/31/18  Yes Eubanks, Jessica K, NP  omeprazole (PRILOSEC) 20 MG capsule Take 1 capsule (20 mg total) by mouth daily. 12/20/17  Yes Eubanks, Jessica K, NP  tacrolimus (PROGRAF) 1 MG capsule Take 5-6 mg by mouth See admin instructions. 6 mg in the morning and 5 mg in the evening - 1000, 2200   Yes [provider]  TRESIBA FLEXTOUCH 100 UNIT/ML SOPN FlexTouch Pen Inject 5 Units into the skin at bedtime.    Yes [provider]    Current Facility-Administered Medications  Medication Dose Route Frequency Provider Last Rate Last Dose  . Chlorhexidine Gluconate Cloth 2 % PADS 6 each  6 each Topical Q0600 Deterding, James, MD   6 each at 01/31/19 0552  . Chlorhexidine Gluconate Cloth 2 % PADS 6 each  6 each Topical Q0600 Deterding, James, MD   6 each   at 01/31/19 0552  . dapsone tablet 50 mg  50 mg Oral Daily Erick Colace, NP   50 mg at 01/30/19 1009  . diphenoxylate-atropine (LOMOTIL) 2.5-0.025 MG per tablet 1 tablet  1 tablet Oral QID Candee Furbish, MD      . feeding supplement (NEPRO CARB STEADY) liquid 237 mL  237 mL Oral TID BM Deterding, Jeneen Rinks, MD      . heparin 10,000 units/ 20 mL infusion syringe  250-3,000 Units/hr CRRT Continuous Mauricia Area, MD   Stopped at 01/29/19 1813  . heparin bolus via infusion syringe 1,000 Units  1,000 Units CRRT PRN Deterding, Jeneen Rinks, MD      . heparin injection 1,000-6,000 Units  1,000-6,000 Units CRRT PRN Deterding, Jeneen Rinks, MD      . heparin injection 5,000 Units  5,000 Units Subcutaneous Q8H Candee Furbish, MD   5,000 Units at 01/30/19 1009  . insulin aspart (novoLOG) injection 0-15 Units  0-15 Units Subcutaneous TID WC Candee Furbish, MD   3 Units at 01/31/19 [phone]  . insulin aspart (novoLOG) injection 0-5 Units  0-5 Units Subcutaneous QHS Candee Furbish, MD      . MEDLINE mouth  rinse  15 mL Mouth Rinse BID Margaretha Seeds, MD   15 mL at 01/30/19 2251  . midodrine (PROAMATINE) tablet 5 mg  5 mg Oral TID WC Candee Furbish, MD      . multivitamin (RENA-VIT) tablet 1 tablet  1 tablet Oral QHS Mauricia Area, MD      . ondansetron [facility]) injection 4 mg  4 mg Intravenous Once Ronnette Juniper, MD      . polyethylene glycol-electrolytes (NuLYTELY/GoLYTELY) solution 4,000 mL  4,000 mL Oral Once Ronnette Juniper, MD      . saccharomyces boulardii (FLORASTOR) capsule 250 mg  250 mg Oral BID Candee Furbish, MD      . sodium chloride 0.9 % primer fluid for CRRT   CRRT PRN Deterding, Jeneen Rinks, MD      . tacrolimus (PROGRAF) capsule 3 mg  3 mg Oral BID Mauricia Area, MD   3 mg at 01/30/19 2250    Allergies as of 01/29/2019 - Review Complete 01/29/2019  Allergen Reaction Noted  . Lactose intolerance (gi) Diarrhea 02/07/2017  . Other Other (See Comments) 07/16/2015  . Pollen extract Other (See Comments) 07/16/2015  . Tape Rash 07/07/2012    Family History  Problem Relation Age of Onset  . Diabetes Father   . Kidney disease Father   . Other Father        amputation  . Hypertension Mother   . Dementia Mother   . Diabetes Sister   . Hypertension Sister   . Diabetes Sister   . Kidney disease Sister   . Diabetes Sister   . Hypertension Brother   . Diabetes Brother   . CAD Neg Hx   . Sudden Cardiac Death Neg Hx   . Congestive Heart Failure Neg Hx   . Colon cancer Neg Hx   . Rectal cancer Neg Hx   . Esophageal cancer Neg Hx   . Liver cancer Neg Hx     Social History   Socioeconomic History  . Marital status: Married    Spouse name: Not on file  . Number of children: 2  . Years of education: Not on file  . Highest education level: Not on file  Occupational History  . Occupation: disabled  Social Needs  . Financial resource strain: Somewhat hard  .  Food insecurity    Worry: Never true    Inability: Never true  . Transportation needs    Medical: No     Non-medical: No  Tobacco Use  . Smoking status: Never Smoker  . Smokeless tobacco: Never Used  Substance and Sexual Activity  . Alcohol use: No    Alcohol/week: 0.0 standard drinks  . Drug use: No  . Sexual activity: Not on file  Lifestyle  . Physical activity    Days per week: 4 days    Minutes per session: 30 min  . Stress: Not at all  Relationships  . Social connections    Talks on phone: More than three times a week    Gets together: More than three times a week    Attends religious service: More than 4 times per year    Active member of club or organization: No    Attends meetings of clubs or organizations: Never    Relationship status: Married  . Intimate partner violence    Fear of current or ex partner: No    Emotionally abused: No    Physically abused: No    Forced sexual activity: No  Other Topics Concern  . Not on file  Social History Narrative   Social History      Diet?       Do you drink/eat things with caffeine? yes      Marital status?            married                        What year were you married?       Do you live in a house, apartment, assisted living, condo, trailer, etc.? apartment      Is it one or more stories? one      How many persons live in your home? 2      Do you have any pets in your home? (please list) no      Highest level of education completed?       Current or past profession: bus owner      Do you exercise?            yes                          Type & how often? Walking daily      Advanced Directives      Do you have a living will? no      Do you have a DNR form?            no                      If not, do you want to discuss one?      Do you have signed POA/HPOA for forms? no      Functional Status      Do you have difficulty bathing or dressing yourself? yes      Do you have difficulty preparing food or eating? no      Do you have difficulty managing your medications? yes      Do you have difficulty  managing your finances? no      Do you have difficulty affording your medications? yes    Review of Systems: Positive for: GI: Described in detail in HPI.    Gen: fatigue, weakness,Denies any fever, chills,  rigors, night sweats, anorexia,  malaise, involuntary weight loss, and sleep disorder CV: Denies chest pain, angina, palpitations, syncope, orthopnea, PND, peripheral edema, and claudication. Resp: dyspnea, Denies cough, sputum, wheezing, coughing up blood. GU : Decreased urine output, denies urinary burning, blood in urine, urinary frequency, urinary hesitancy, nocturnal urination, and urinary incontinence. MS: Denies joint pain or swelling.  Denies muscle weakness, cramps, atrophy.  Derm: Denies rash, itching, oral ulcerations, hives, unhealing ulcers.  Psych: Denies depression, anxiety, memory loss, suicidal ideation, hallucinations,  and confusion. Heme: Denies bruising, bleeding, and enlarged lymph nodes. Neuro:  Denies any headaches, dizziness, paresthesias. Endo:  DM,Denies any problems with  thyroid, adrenal function.  Physical Exam: Vital signs in last 24 hours: Temp:  [97.7 F (36.5 C)-99.2 F (37.3 C)] 98.6 F (37 C) (06/17 0700) Pulse Rate:  [87-115] 92 (06/17 0600) Resp:  [14-30] 15 (06/17 0700) BP: (81-118)/(27-69) 89/54 (06/17 0700) SpO2:  [98 %-100 %] 100 % (06/17 0600) Weight:  [51.2 kg-51.3 kg] 51.3 kg (06/17 0500) Last BM Date: 01/30/19  General:   Thinly built, pleasant, appears older than stated age Head:  Normocephalic and atraumatic. Eyes:  Sclera clear, no icterus.   Prominent pallor Ears:  Normal auditory acuity. Nose:  No deformity, discharge,  or lesions. Mouth:  No deformity or lesions.  Oropharynx pink & moist. Neck:  Supple; no masses or thyromegaly. Lungs:  Clear throughout to auscultation.   No wheezes, crackles, or rhonchi. No acute distress. Heart:  Regular rate and rhythm; no murmurs, clicks, rubs,  or gallops. Extremities:  Without  clubbing or edema. Neurologic:  Alert and  oriented x4;  grossly normal neurologically. Skin:  Intact without significant lesions or rashes. Psych:  Alert and cooperative. Normal mood and affect. Abdomen:  Soft, nontender and nondistended. No masses, hepatosplenomegaly or hernias noted. Normal bowel sounds, without guarding, and without rebound.         Lab Results: Recent Labs    01/30/19 1054 01/30/19 1624 01/31/19 0804  WBC 9.5 9.5 8.7  HGB 5.9* 5.9* 7.7*  HCT 18.5* 18.3* 22.8*  PLT 78* 81* 71*   BMET Recent Labs    01/30/19 0008 01/30/19 0351 01/31/19 0804  NA 139 140 139  K 4.1 3.8 3.4*  CL 107 107 104  CO2 12* 17* 22  GLUCOSE 205* 146* 199*  BUN 91* 93* 51*  CREATININE 10.06* 9.81* 6.99*  CALCIUM 7.8* 7.7* 7.3*   LFT Recent Labs    01/29/19 1245 01/30/19 0351  PROT 7.0  --   ALBUMIN 4.4 3.0*  AST 8*  --   ALT 13  --   ALKPHOS 118  --   BILITOT 0.5  --    PT/INR No results for input(s): LABPROT, INR in the last 72 hours.  Studies/Results: Ct Abdomen Pelvis Wo Contrast  Result Date: 01/29/2019 CLINICAL DATA:  Shortness of breath and diarrhea. Status post kidney transplant 6 years ago. EXAM: CT ABDOMEN AND PELVIS WITHOUT CONTRAST TECHNIQUE: Multidetector CT imaging of the abdomen and pelvis was performed following the standard protocol without IV contrast. COMPARISON:  12/25/2018 FINDINGS: Lower chest: Examination is severely limited by motion artifact. Given this limitation no definite abnormality was detected involving the lower lobes. Hepatobiliary: The liver is unremarkable. The gallbladder is significantly distended. There appears to be some gallbladder sludge. Pancreas: No significant abnormality, however evaluation is limited by motion artifact. Spleen: Normal in size without focal abnormality. Adrenals/Urinary Tract: The native kidneys are atrophic with multiple small calcifications. The adrenal glands   are not well evaluated secondary to significant  motion artifact. There is a transplant kidney in the left pelvis without evidence of hydronephrosis. The bladder is unremarkable. Stomach/Bowel: There is some mild diffuse wall thickening of the sigmoid colon. There is likely liquid stool throughout the colon. The appendix appears unremarkable but is suboptimally evaluated secondary to extensive motion artifact. No evidence of a small-bowel obstruction. The stomach is unremarkable. Vascular/Lymphatic: Aortic atherosclerosis. No enlarged abdominal or pelvic lymph nodes. Reproductive: Prostate is unremarkable. Other: No abdominal wall hernia or abnormality. No abdominopelvic ascites. Musculoskeletal: No fracture is seen. IMPRESSION: 1. Very limited study secondary to extensive motion artifact. 2. Wall thickening of the sigmoid colon with liquid stool throughout the colon. Findings may be secondary to an infectious or inflammatory colitis. 3. Transplant kidney in the left pelvis without evidence of hydronephrosis. 4. Very distended gallbladder, not significantly changed from 12/25/2018. If there is clinical suspicion for acute cholecystitis, follow-up with ultrasound is recommended. There is likely gallbladder sludge. Electronically Signed   By: Christopher  Green M.D.   On: 01/29/2019 17:01   Dg Chest Port 1 View  Result Date: 01/30/2019 CLINICAL DATA:  Dyspnea. EXAM: PORTABLE CHEST 1 VIEW COMPARISON:  Single-view of the chest 01/29/2019. PA and lateral chest 01/18/2018. FINDINGS: Lungs clear. Heart size normal. No pneumothorax or pleural fluid. No bony abnormality. IMPRESSION: Normal chest. Electronically Signed   By: Thomas  Dalessio M.D.   On: 01/30/2019 07:59   Dg Chest Port 1 View  Result Date: 01/29/2019 CLINICAL DATA:  Shortness of breath for 1 day. EXAM: PORTABLE CHEST 1 VIEW COMPARISON:  None. FINDINGS: Cardiomediastinal silhouette is normal. Mediastinal contours appear intact. There is no evidence of focal airspace consolidation, pleural effusion or  pneumothorax. Osseous structures are without acute abnormality. Soft tissues are grossly normal. IMPRESSION: No active disease. Electronically Signed   By: Dobrinka  Dimitrova M.D.   On: 01/29/2019 13:43    Impression: Chronic diarrhea causing worsening acidosis and dehydration Infectious work-up unremarkable Colitis noted on CAT scan, no prior colonoscopy  Likely multifactorial-side effects of tacrolimus/mycophenolate, autonomic neuropathy from diabetes, possible microscopic colitis. Currently on Lomotil 1 tablet up to 4 times a day. Started on Florastor 250 mg twice a day.  Anemia and thrombocytopenia likely related to immunosuppressive therapy Chronic kidney disease, is post renal transplant in 2015, restarted on hemodialysis Hemoglobin 5.9, unit PRBC transfusion given, 7.7 posttransfusion  Recurrent intermittent nausea and vomiting, possibly related to diabetic gastroparesis  Plan: Diagnostic colonoscopy in a.m. Although patient has diarrhea, he was on renal diet, and the prep for colonoscopy may not be adequate. Plan to perform diagnostic colonoscopy with patient on clear liquid diet and with colonic prep if able to take it. The risks and the benefits of the procedure were discussed with the patient in details, he verbalized understanding and consents. May benefit from use of colestipol/cholestyramine if colonoscopy and biopsies are unremarkable.    LOS: 2 days   Arya Karki, MD  01/31/2019, 9:33 AM  Pager [phone] If no answer or after 5 PM call [phone] 

## 2019-01-31 NOTE — H&P (View-Only) (Signed)
[facility] Consult  Referring Provider: Smith, Daniel C, MD/Critical care Primary Care Physician:  Eubanks, Jessica K, NP Primary Gastroenterologist: Unassigned  Reason for Consultation: Diarrhea  HPI: [name] is a 48 y.o. male was admitted on 01/29/2019 with diarrhea associated with shortness of breath. This is a pleasant African-American gentleman with history of renal transplant 6 years ago maintained on mycophenolate 720 mg BD and tacrolimus 6 mg, restarted on hemodialysis due to worsened renal function in 12/2018, along with history of diabetes mellitus type 2, hypertension, dyslipidemia, history of DKA, anemia and chronic kidney disease, congestive dilated cardiomyopathy, prolonged QT interval. He was admitted between 12/23/2020 01/03/2019 due to severe metabolic acidosis worsening diarrhea. Patient states that diarrhea has worsened in the last 3 weeks, described as almost 3-4 loose to watery stools during the day and up to 3 episodes of nocturnal diarrhea on a regular basis.  He has noted bright red blood in stool intermittently and often has mild generalized abdominal pain.  No prior colonoscopy. He also has multiple episodes of nausea and vomiting of bilious fluid.  He denies difficulty swallowing or pain on swallowing.  Patient reports trying over-the-counter Imodium for a few days which helps, but the diarrhea recurs when he stops Imodium. On this admission he was found to have profound metabolic acidosis with a pH of 6.9 and a blood glucose of 349. Stool studies are negative for C. difficile as well as GI pathogen panel. CT abdomen performed without contrast showed mild diffuse wall thickening of sigmoid with liquid stool throughout colon which may be infectious or inflammatory.  Incidentally a very distended gallbladder was also noted.   Past Medical History:  Diagnosis Date  . Anemia   . CKD (chronic kidney disease), stage III (HCC)   . 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 ESRD (end stage renal disease) on dialysis (HCC)    1.Initiated HD via right per cate 12/30/2008 (MWF schedule at [facility]) 2.Left upper extremity A-V fistula by Dr Fields 12/30/2008 3.Aemia: Received Venofer 12/30/08 Ferritin 179 % sat 12 12/23/08, 4. Secondary Hyperparathyroidism- PTH 188, P 5.4, Ca 9.2 12/2008  . Hemodialysis-associated hypotension    coreg d/c'd  . Herniated lumbar disc without myelopathy   . History of blood transfusion   . Hyperparathyroidism due to renal insufficiency (HCC)   . Hypertension    resloved after initating HD, now with post HD hypotension  . Hypocalcemia   . Hypomagnesemia   . Kidney transplant as cause of abnormal reaction or later complication 2015   [facility]  . Non-ischemic cardiomyopathy (HCC)    EF 20-25% 12/23/2008>>EF 40-45% 03/2009>>EF ??? 7/?/2011  . Occult blood positive stool   . Psoriasis    scalp  . Thrombocytopenia (HCC)   . Vitamin B 12 deficiency     Past Surgical History:  Procedure Laterality Date  . AV FISTULA PLACEMENT     left  . AV FISTULA PLACEMENT  09/12/2012   Procedure: INSERTION OF ARTERIOVENOUS (AV) GORE-TEX GRAFT ARM;  Surgeon: Charles E Fields, MD;  Location: [facility] OR;  Service: Vascular;  Laterality: Left;  KIDNEY TRANSPLANT FAILED  . CATARACT EXTRACTION, BILATERAL    . EYE SURGERY    . INSERTION OF DIALYSIS CATHETER  07/14/2012   Procedure: INSERTION OF DIALYSIS CATHETER;  Surgeon: James D Lawson, MD;  Location: [facility] OR;  Service:   Vascular;  Laterality: Right;  Ultrasound Guided Insertion of Internal Jugular Dialysis Catheter  . IR THROMBECTOMY AV FISTULA W/THROMBOLYSIS/PTA INC/SHUNT/IMG LEFT Left 12/28/2018  . IR US GUIDE VASC ACCESS LEFT  12/28/2018  . 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 KIDNEY TRANSPLANT  12/2013   second kidney transplant   . lumbar disc sugery   10/17/2017  . LUMBAR DISC SURGERY    . NEPHRECTOMY TRANSPLANTED ORGAN  08/11/12  . PHOTOCOAGULATION     for diabetic retinopathy 08/1999  . SHUNTOGRAM N/A 07/11/2012   Procedure: SHUNTOGRAM;  Surgeon: Vance W Brabham, MD;  Location: [facility] CATH LAB;  Service: Cardiovascular;  Laterality: N/A;    Prior to Admission medications   Medication Sig Start Date End Date Taking? Authorizing Provider  aspirin EC 81 MG tablet Take 81 mg by mouth daily.   Yes [provider]  atorvastatin (LIPITOR) 20 MG tablet Take one tablet by mouth at bedtime for cholesterol. NEEDS AN APPOINTMENT BEFORE ANYMORE FUTURE REFILLS. 01/11/19  Yes Eubanks, Jessica K, NP  carvedilol (COREG) 6.25 MG tablet Take 1 tablet (6.25 mg total) by mouth 2 (two) times daily with a meal. 12/20/17  Yes Eubanks, Jessica K, NP  Continuous Blood Gluc Receiver (DEXCOM RECEIVER KIT) DEVI 1 each by Does not apply route 3 (three) times daily. Use to test blood sugar three times daily. Dx: E11.9 08/03/18  Yes Eubanks, Jessica K, NP  Continuous Blood Gluc Sensor (DEXCOM G6 SENSOR) MISC 1 each by Does not apply route 3 (three) times daily. Use to test blood sugar three times daily. Dx: E11.9 08/03/18  Yes Eubanks, Jessica K, NP  Continuous Blood Gluc Transmit (DEXCOM G6 TRANSMITTER) MISC 1 each by Does not apply route 3 (three) times daily. Use to test blood sugar three times daily. Dx: E11.9 08/03/18  Yes Eubanks, Jessica K, NP  dapsone 25 MG tablet Take 50 mg by mouth daily.   Yes [provider]  folic acid (FOLVITE) 1 MG tablet Take 1 mg by mouth daily.  06/24/15  Yes [provider]  Insulin Pen Needle 31G X 5 MM MISC 5 Units/oz/day by Does not apply route at bedtime. Use 4 times daily to inject insulin to skin 12/07/17  Yes Mathews, Elizabeth G, MD  mycophenolate (MYFORTIC) 180 MG EC tablet Take 720 mg by mouth 2 (two) times daily.    Yes [provider]  NOVOLOG FLEXPEN 100 UNIT/ML FlexPen  INJECT 12 UNITS INTO THE SKIN 3 TIMES DAILY BEFORE MEALS. Patient taking differently: Inject 1-10 Units into the skin 3 (three) times daily. Per sliding scale. 10/31/18  Yes Eubanks, Jessica K, NP  omeprazole (PRILOSEC) 20 MG capsule Take 1 capsule (20 mg total) by mouth daily. 12/20/17  Yes Eubanks, Jessica K, NP  tacrolimus (PROGRAF) 1 MG capsule Take 5-6 mg by mouth See admin instructions. 6 mg in the morning and 5 mg in the evening - 1000, 2200   Yes [provider]  TRESIBA FLEXTOUCH 100 UNIT/ML SOPN FlexTouch Pen Inject 5 Units into the skin at bedtime.    Yes [provider]    Current Facility-Administered Medications  Medication Dose Route Frequency Provider Last Rate Last Dose  . Chlorhexidine Gluconate Cloth 2 % PADS 6 each  6 each Topical Q0600 Deterding, James, MD   6 each at 01/31/19 0552  . Chlorhexidine Gluconate Cloth 2 % PADS 6 each  6 each Topical Q0600 Deterding, James, MD   6 each   at 01/31/19 0552  . dapsone tablet 50 mg  50 mg Oral Daily Babcock, Peter E, NP   50 mg at 01/30/19 1009  . diphenoxylate-atropine (LOMOTIL) 2.5-0.025 MG per tablet 1 tablet  1 tablet Oral QID Smith, Daniel C, MD      . feeding supplement (NEPRO CARB STEADY) liquid 237 mL  237 mL Oral TID BM Deterding, James, MD      . heparin 10,000 units/ 20 mL infusion syringe  250-3,000 Units/hr CRRT Continuous Deterding, James, MD   Stopped at 01/29/19 1813  . heparin bolus via infusion syringe 1,000 Units  1,000 Units CRRT PRN Deterding, James, MD      . heparin injection 1,000-6,000 Units  1,000-6,000 Units CRRT PRN Deterding, James, MD      . heparin injection 5,000 Units  5,000 Units Subcutaneous Q8H Smith, Daniel C, MD   5,000 Units at 01/30/19 1009  . insulin aspart (novoLOG) injection 0-15 Units  0-15 Units Subcutaneous TID WC Smith, Daniel C, MD   3 Units at 01/31/19 0853  . insulin aspart (novoLOG) injection 0-5 Units  0-5 Units Subcutaneous QHS Smith, Daniel C, MD      . MEDLINE mouth  rinse  15 mL Mouth Rinse BID Ellison, Chi Jane, MD   15 mL at 01/30/19 2251  . midodrine (PROAMATINE) tablet 5 mg  5 mg Oral TID WC Smith, Daniel C, MD      . multivitamin (RENA-VIT) tablet 1 tablet  1 tablet Oral QHS Deterding, James, MD      . ondansetron (ZOFRAN) injection 4 mg  4 mg Intravenous Once Vivian Okelley, MD      . polyethylene glycol-electrolytes (NuLYTELY/GoLYTELY) solution 4,000 mL  4,000 mL Oral Once Keandrea Tapley, MD      . saccharomyces boulardii (FLORASTOR) capsule 250 mg  250 mg Oral BID Smith, Daniel C, MD      . sodium chloride 0.9 % primer fluid for CRRT   CRRT PRN Deterding, James, MD      . tacrolimus (PROGRAF) capsule 3 mg  3 mg Oral BID Deterding, James, MD   3 mg at 01/30/19 2250    Allergies as of 01/29/2019 - Review Complete 01/29/2019  Allergen Reaction Noted  . Lactose intolerance (gi) Diarrhea 02/07/2017  . Other Other (See Comments) 07/16/2015  . Pollen extract Other (See Comments) 07/16/2015  . Tape Rash 07/07/2012    Family History  Problem Relation Age of Onset  . Diabetes Father   . Kidney disease Father   . Other Father        amputation  . Hypertension Mother   . Dementia Mother   . Diabetes Sister   . Hypertension Sister   . Diabetes Sister   . Kidney disease Sister   . Diabetes Sister   . Hypertension Brother   . Diabetes Brother   . CAD Neg Hx   . Sudden Cardiac Death Neg Hx   . Congestive Heart Failure Neg Hx   . Colon cancer Neg Hx   . Rectal cancer Neg Hx   . Esophageal cancer Neg Hx   . Liver cancer Neg Hx     Social History   Socioeconomic History  . Marital status: Married    Spouse name: Not on file  . Number of children: 2  . Years of education: Not on file  . Highest education level: Not on file  Occupational History  . Occupation: disabled  Social Needs  . Financial resource strain: Somewhat hard  .   Food insecurity    Worry: Never true    Inability: Never true  . Transportation needs    Medical: No     Non-medical: No  Tobacco Use  . Smoking status: Never Smoker  . Smokeless tobacco: Never Used  Substance and Sexual Activity  . Alcohol use: No    Alcohol/week: 0.0 standard drinks  . Drug use: No  . Sexual activity: Not on file  Lifestyle  . Physical activity    Days per week: 4 days    Minutes per session: 30 min  . Stress: Not at all  Relationships  . Social connections    Talks on phone: More than three times a week    Gets together: More than three times a week    Attends religious service: More than 4 times per year    Active member of club or organization: No    Attends meetings of clubs or organizations: Never    Relationship status: Married  . Intimate partner violence    Fear of current or ex partner: No    Emotionally abused: No    Physically abused: No    Forced sexual activity: No  Other Topics Concern  . Not on file  Social History Narrative   Social History      Diet?       Do you drink/eat things with caffeine? yes      Marital status?            married                        What year were you married?       Do you live in a house, apartment, assisted living, condo, trailer, etc.? apartment      Is it one or more stories? one      How many persons live in your home? 2      Do you have any pets in your home? (please list) no      Highest level of education completed?       Current or past profession: bus owner      Do you exercise?            yes                          Type & how often? Walking daily      Advanced Directives      Do you have a living will? no      Do you have a DNR form?            no                      If not, do you want to discuss one?      Do you have signed POA/HPOA for forms? no      Functional Status      Do you have difficulty bathing or dressing yourself? yes      Do you have difficulty preparing food or eating? no      Do you have difficulty managing your medications? yes      Do you have difficulty  managing your finances? no      Do you have difficulty affording your medications? yes    Review of Systems: Positive for: GI: Described in detail in HPI.    Gen: fatigue, weakness,Denies any fever, chills,   rigors, night sweats, anorexia,  malaise, involuntary weight loss, and sleep disorder CV: Denies chest pain, angina, palpitations, syncope, orthopnea, PND, peripheral edema, and claudication. Resp: dyspnea, Denies cough, sputum, wheezing, coughing up blood. GU : Decreased urine output, denies urinary burning, blood in urine, urinary frequency, urinary hesitancy, nocturnal urination, and urinary incontinence. MS: Denies joint pain or swelling.  Denies muscle weakness, cramps, atrophy.  Derm: Denies rash, itching, oral ulcerations, hives, unhealing ulcers.  Psych: Denies depression, anxiety, memory loss, suicidal ideation, hallucinations,  and confusion. Heme: Denies bruising, bleeding, and enlarged lymph nodes. Neuro:  Denies any headaches, dizziness, paresthesias. Endo:  DM,Denies any problems with  thyroid, adrenal function.  Physical Exam: Vital signs in last 24 hours: Temp:  [97.7 F (36.5 C)-99.2 F (37.3 C)] 98.6 F (37 C) (06/17 0700) Pulse Rate:  [87-115] 92 (06/17 0600) Resp:  [14-30] 15 (06/17 0700) BP: (81-118)/(27-69) 89/54 (06/17 0700) SpO2:  [98 %-100 %] 100 % (06/17 0600) Weight:  [51.2 kg-51.3 kg] 51.3 kg (06/17 0500) Last BM Date: 01/30/19  General:   Thinly built, pleasant, appears older than stated age Head:  Normocephalic and atraumatic. Eyes:  Sclera clear, no icterus.   Prominent pallor Ears:  Normal auditory acuity. Nose:  No deformity, discharge,  or lesions. Mouth:  No deformity or lesions.  Oropharynx pink & moist. Neck:  Supple; no masses or thyromegaly. Lungs:  Clear throughout to auscultation.   No wheezes, crackles, or rhonchi. No acute distress. Heart:  Regular rate and rhythm; no murmurs, clicks, rubs,  or gallops. Extremities:  Without  clubbing or edema. Neurologic:  Alert and  oriented x4;  grossly normal neurologically. Skin:  Intact without significant lesions or rashes. Psych:  Alert and cooperative. Normal mood and affect. Abdomen:  Soft, nontender and nondistended. No masses, hepatosplenomegaly or hernias noted. Normal bowel sounds, without guarding, and without rebound.         Lab Results: Recent Labs    01/30/19 1054 01/30/19 1624 01/31/19 0804  WBC 9.5 9.5 8.7  HGB 5.9* 5.9* 7.7*  HCT 18.5* 18.3* 22.8*  PLT 78* 81* 71*   BMET Recent Labs    01/30/19 0008 01/30/19 0351 01/31/19 0804  NA 139 140 139  K 4.1 3.8 3.4*  CL 107 107 104  CO2 12* 17* 22  GLUCOSE 205* 146* 199*  BUN 91* 93* 51*  CREATININE 10.06* 9.81* 6.99*  CALCIUM 7.8* 7.7* 7.3*   LFT Recent Labs    01/29/19 1245 01/30/19 0351  PROT 7.0  --   ALBUMIN 4.4 3.0*  AST 8*  --   ALT 13  --   ALKPHOS 118  --   BILITOT 0.5  --    PT/INR No results for input(s): LABPROT, INR in the last 72 hours.  Studies/Results: Ct Abdomen Pelvis Wo Contrast  Result Date: 01/29/2019 CLINICAL DATA:  Shortness of breath and diarrhea. Status post kidney transplant 6 years ago. EXAM: CT ABDOMEN AND PELVIS WITHOUT CONTRAST TECHNIQUE: Multidetector CT imaging of the abdomen and pelvis was performed following the standard protocol without IV contrast. COMPARISON:  12/25/2018 FINDINGS: Lower chest: Examination is severely limited by motion artifact. Given this limitation no definite abnormality was detected involving the lower lobes. Hepatobiliary: The liver is unremarkable. The gallbladder is significantly distended. There appears to be some gallbladder sludge. Pancreas: No significant abnormality, however evaluation is limited by motion artifact. Spleen: Normal in size without focal abnormality. Adrenals/Urinary Tract: The native kidneys are atrophic with multiple small calcifications. The adrenal glands   are not well evaluated secondary to significant  motion artifact. There is a transplant kidney in the left pelvis without evidence of hydronephrosis. The bladder is unremarkable. Stomach/Bowel: There is some mild diffuse wall thickening of the sigmoid colon. There is likely liquid stool throughout the colon. The appendix appears unremarkable but is suboptimally evaluated secondary to extensive motion artifact. No evidence of a small-bowel obstruction. The stomach is unremarkable. Vascular/Lymphatic: Aortic atherosclerosis. No enlarged abdominal or pelvic lymph nodes. Reproductive: Prostate is unremarkable. Other: No abdominal wall hernia or abnormality. No abdominopelvic ascites. Musculoskeletal: No fracture is seen. IMPRESSION: 1. Very limited study secondary to extensive motion artifact. 2. Wall thickening of the sigmoid colon with liquid stool throughout the colon. Findings may be secondary to an infectious or inflammatory colitis. 3. Transplant kidney in the left pelvis without evidence of hydronephrosis. 4. Very distended gallbladder, not significantly changed from 12/25/2018. If there is clinical suspicion for acute cholecystitis, follow-up with ultrasound is recommended. There is likely gallbladder sludge. Electronically Signed   By: Christopher  Green M.D.   On: 01/29/2019 17:01   Dg Chest Port 1 View  Result Date: 01/30/2019 CLINICAL DATA:  Dyspnea. EXAM: PORTABLE CHEST 1 VIEW COMPARISON:  Single-view of the chest 01/29/2019. PA and lateral chest 01/18/2018. FINDINGS: Lungs clear. Heart size normal. No pneumothorax or pleural fluid. No bony abnormality. IMPRESSION: Normal chest. Electronically Signed   By: Thomas  Dalessio M.D.   On: 01/30/2019 07:59   Dg Chest Port 1 View  Result Date: 01/29/2019 CLINICAL DATA:  Shortness of breath for 1 day. EXAM: PORTABLE CHEST 1 VIEW COMPARISON:  None. FINDINGS: Cardiomediastinal silhouette is normal. Mediastinal contours appear intact. There is no evidence of focal airspace consolidation, pleural effusion or  pneumothorax. Osseous structures are without acute abnormality. Soft tissues are grossly normal. IMPRESSION: No active disease. Electronically Signed   By: Dobrinka  Dimitrova M.D.   On: 01/29/2019 13:43    Impression: Chronic diarrhea causing worsening acidosis and dehydration Infectious work-up unremarkable Colitis noted on CAT scan, no prior colonoscopy  Likely multifactorial-side effects of tacrolimus/mycophenolate, autonomic neuropathy from diabetes, possible microscopic colitis. Currently on Lomotil 1 tablet up to 4 times a day. Started on Florastor 250 mg twice a day.  Anemia and thrombocytopenia likely related to immunosuppressive therapy Chronic kidney disease, is post renal transplant in 2015, restarted on hemodialysis Hemoglobin 5.9, unit PRBC transfusion given, 7.7 posttransfusion  Recurrent intermittent nausea and vomiting, possibly related to diabetic gastroparesis  Plan: Diagnostic colonoscopy in a.m. Although patient has diarrhea, he was on renal diet, and the prep for colonoscopy may not be adequate. Plan to perform diagnostic colonoscopy with patient on clear liquid diet and with colonic prep if able to take it. The risks and the benefits of the procedure were discussed with the patient in details, he verbalized understanding and consents. May benefit from use of colestipol/cholestyramine if colonoscopy and biopsies are unremarkable.    LOS: 2 days   Berta Denson, MD  01/31/2019, 9:33 AM  Pager [phone] If no answer or after 5 PM call [phone] 

## 2019-01-31 NOTE — Progress Notes (Signed)
Subjective: Interval History: has complaints concerned about his D.  Refused his labs earlier,agrees now.  Objective: Vital signs in last 24 hours: Temp:  [97.7 F (36.5 C)-99.2 F (37.3 C)] 98.5 F (36.9 C) (06/17 0432) Pulse Rate:  [87-115] 92 (06/17 0600) Resp:  [14-30] 15 (06/17 0700) BP: (81-118)/(27-69) 89/54 (06/17 0700) SpO2:  [98 %-100 %] 100 % (06/17 0600) Weight:  [51.2 kg-51.3 kg] 51.3 kg (06/17 0500) Weight change: -10.9 kg  Intake/Output from previous day: 06/16 0701 - 06/17 0700 In: 630.3 [I.V.:431.4; IV Piggyback:198.9] Out: -150  Intake/Output this shift: No intake/output data recorded.  General appearance: alert, cooperative, cachectic, no distress and pale Resp: clear to auscultation bilaterally Cardio: S1, S2 normal and systolic murmur: systolic ejection 2/6, crescendo and decrescendo at 2nd left intercostal space GI: soft, pos bs, ^ Extremities: AVG L FA  Lab Results: Recent Labs    01/30/19 1054 01/30/19 1624  WBC 9.5 9.5  HGB 5.9* 5.9*  HCT 18.5* 18.3*  PLT 78* 81*   BMET:  Recent Labs    01/30/19 0008 01/30/19 0351  NA 139 140  K 4.1 3.8  CL 107 107  CO2 12* 17*  GLUCOSE 205* 146*  BUN 91* 93*  CREATININE 10.06* 9.81*  CALCIUM 7.8* 7.7*   Recent Labs    01/29/19 1626  PTH 17   Iron Studies:  Recent Labs    01/29/19 1624  IRON 187*  TIBC 237*    Studies/Results: Ct Abdomen Pelvis Wo Contrast  Result Date: 01/29/2019 CLINICAL DATA:  Shortness of breath and diarrhea. Status post kidney transplant 6 years ago. EXAM: CT ABDOMEN AND PELVIS WITHOUT CONTRAST TECHNIQUE: Multidetector CT imaging of the abdomen and pelvis was performed following the standard protocol without IV contrast. COMPARISON:  12/25/2018 FINDINGS: Lower chest: Examination is severely limited by motion artifact. Given this limitation no definite abnormality was detected involving the lower lobes. Hepatobiliary: The liver is unremarkable. The gallbladder is  significantly distended. There appears to be some gallbladder sludge. Pancreas: No significant abnormality, however evaluation is limited by motion artifact. Spleen: Normal in size without focal abnormality. Adrenals/Urinary Tract: The native kidneys are atrophic with multiple small calcifications. The adrenal glands are not well evaluated secondary to significant motion artifact. There is a transplant kidney in the left pelvis without evidence of hydronephrosis. The bladder is unremarkable. Stomach/Bowel: There is some mild diffuse wall thickening of the sigmoid colon. There is likely liquid stool throughout the colon. The appendix appears unremarkable but is suboptimally evaluated secondary to extensive motion artifact. No evidence of a small-bowel obstruction. The stomach is unremarkable. Vascular/Lymphatic: Aortic atherosclerosis. No enlarged abdominal or pelvic lymph nodes. Reproductive: Prostate is unremarkable. Other: No abdominal wall hernia or abnormality. No abdominopelvic ascites. Musculoskeletal: No fracture is seen. IMPRESSION: 1. Very limited study secondary to extensive motion artifact. 2. Wall thickening of the sigmoid colon with liquid stool throughout the colon. Findings may be secondary to an infectious or inflammatory colitis. 3. Transplant kidney in the left pelvis without evidence of hydronephrosis. 4. Very distended gallbladder, not significantly changed from 12/25/2018. If there is clinical suspicion for acute cholecystitis, follow-up with ultrasound is recommended. There is likely gallbladder sludge. Electronically Signed   By: Constance Holster M.D.   On: 01/29/2019 17:01   Dg Chest Port 1 View  Result Date: 01/30/2019 CLINICAL DATA:  Dyspnea. EXAM: PORTABLE CHEST 1 VIEW COMPARISON:  Single-view of the chest 01/29/2019. PA and lateral chest 01/18/2018. FINDINGS: Lungs clear. Heart size normal. No pneumothorax or  pleural fluid. No bony abnormality. IMPRESSION: Normal chest.  Electronically Signed   By: Inge Rise M.D.   On: 01/30/2019 07:59   Dg Chest Port 1 View  Result Date: 01/29/2019 CLINICAL DATA:  Shortness of breath for 1 day. EXAM: PORTABLE CHEST 1 VIEW COMPARISON:  None. FINDINGS: Cardiomediastinal silhouette is normal. Mediastinal contours appear intact. There is no evidence of focal airspace consolidation, pleural effusion or pneumothorax. Osseous structures are without acute abnormality. Soft tissues are grossly normal. IMPRESSION: No active disease. Electronically Signed   By: Fidela Salisbury M.D.   On: 01/29/2019 13:43    I have reviewed the patient's current medications.  Assessment/Plan: 1 ESRD  Had HD yest, went well. Will eval chem and decide about HD today. 2 Anemia esa, 3 HPTH hold off Vit D 4 D per primary 5 Failed REnal TX, tapering meds P check chem, ? HD, D w/u   LOS: 2 days   Jeneen Rinks Tanae Petrosky 01/31/2019,7:30 AM

## 2019-01-31 NOTE — Progress Notes (Signed)
Patient refused for H&H to be drawn and refused morning labs. Patient was educated about the importance of finding out the results to treat him accurately and accordingly. Patient stated that "I am fine, you don't need to draw my labs." Will continue to monitor throughout shift.

## 2019-02-01 ENCOUNTER — Inpatient Hospital Stay (HOSPITAL_COMMUNITY): Payer: Medicare Other | Admitting: Certified Registered Nurse Anesthetist

## 2019-02-01 ENCOUNTER — Encounter (HOSPITAL_COMMUNITY): Payer: Self-pay | Admitting: *Deleted

## 2019-02-01 ENCOUNTER — Encounter (HOSPITAL_COMMUNITY)
Admission: EM | Disposition: A | Discharge status: Home / Self Care | Payer: Self-pay | Source: Home / Self Care | Attending: Internal Medicine

## 2019-02-01 DIAGNOSIS — D638 Anemia in other chronic diseases classified elsewhere: Secondary | ICD-10-CM

## 2019-02-01 DIAGNOSIS — E43 Unspecified severe protein-calorie malnutrition: Secondary | ICD-10-CM

## 2019-02-01 HISTORY — PX: BIOPSY: SHX5522

## 2019-02-01 HISTORY — PX: COLONOSCOPY WITH PROPOFOL: SHX5780

## 2019-02-01 LAB — CBC
HCT: 24.4 % — ABNORMAL LOW (ref 39.0–52.0)
Hemoglobin: 8.2 g/dL — ABNORMAL LOW (ref 13.0–17.0)
MCH: 28.3 pg (ref 26.0–34.0)
MCHC: 33.6 g/dL (ref 30.0–36.0)
MCV: 84.1 fL (ref 80.0–100.0)
Platelets: 70 10*3/uL — ABNORMAL LOW (ref 150–400)
RBC: 2.9 MIL/uL — ABNORMAL LOW (ref 4.22–5.81)
RDW: 15.4 % (ref 11.5–15.5)
WBC: 8.7 10*3/uL (ref 4.0–10.5)
nRBC: 0 % (ref 0.0–0.2)

## 2019-02-01 LAB — GLUCOSE, CAPILLARY
Glucose-Capillary: 125 mg/dL — ABNORMAL HIGH (ref 70–99)
Glucose-Capillary: 115 mg/dL — ABNORMAL HIGH (ref 70–99)
Glucose-Capillary: 108 mg/dL — ABNORMAL HIGH (ref 70–99)
Glucose-Capillary: 155 mg/dL — ABNORMAL HIGH (ref 70–99)
Glucose-Capillary: 118 mg/dL — ABNORMAL HIGH (ref 70–99)

## 2019-02-01 LAB — RENAL FUNCTION PANEL
Albumin: 3.3 g/dL — ABNORMAL LOW (ref 3.5–5.0)
Anion gap: 13 (ref 5–15)
BUN: 58 mg/dL — ABNORMAL HIGH (ref 6–20)
CO2: 24 mmol/L (ref 22–32)
Calcium: 7.4 mg/dL — ABNORMAL LOW (ref 8.9–10.3)
Chloride: 104 mmol/L (ref 98–111)
Creatinine, Ser: 8.87 mg/dL — ABNORMAL HIGH (ref 0.61–1.24)
GFR calc Af Amer: 7 mL/min — ABNORMAL LOW (ref >60)
GFR calc non Af Amer: 6 mL/min — ABNORMAL LOW (ref >60)
Glucose, Bld: 121 mg/dL — ABNORMAL HIGH (ref 70–99)
Phosphorus: 5.3 mg/dL — ABNORMAL HIGH (ref 2.5–4.6)
Potassium: 3.4 mmol/L — ABNORMAL LOW (ref 3.5–5.1)
Sodium: 141 mmol/L (ref 135–145)

## 2019-02-01 LAB — LIPASE, BLOOD: Lipase: 71 U/L — ABNORMAL HIGH (ref 11–51)

## 2019-02-01 LAB — APTT: aPTT: 34 seconds (ref 24–36)

## 2019-02-01 LAB — MAGNESIUM: Magnesium: 2.1 mg/dL (ref 1.7–2.4)

## 2019-02-01 SURGERY — COLONOSCOPY WITH PROPOFOL
Anesthesia: Monitor Anesthesia Care

## 2019-02-01 MED ORDER — PROPOFOL 10 MG/ML IV BOLUS
INTRAVENOUS | Status: DC | PRN
  Administered 2019-02-01: 10 mg via INTRAVENOUS

## 2019-02-01 MED ORDER — PROPOFOL 500 MG/50ML IV EMUL
INTRAVENOUS | Status: DC | PRN
  Administered 2019-02-01: 50 ug/kg/min via INTRAVENOUS

## 2019-02-01 MED ORDER — COLESTIPOL HCL 1 G PO TABS
2.0000 g | ORAL_TABLET | Freq: Two times a day (BID) | ORAL | Status: DC
  Administered 2019-02-01 – 2019-02-05 (×4): 2 g via ORAL
  Filled 2019-02-01 (×10): qty 2

## 2019-02-01 MED ORDER — ONDANSETRON HCL 4 MG/2ML IJ SOLN: 4.0000 mg | Freq: Four times a day (QID) | INTRAMUSCULAR | Status: DC | PRN

## 2019-02-01 SURGICAL SUPPLY — 22 items

## 2019-02-01 NOTE — Care Management Important Message (Signed)
Important Message  Patient Details  Name: [name] MRN: [id number] Date of Birth: [birth year]   Medicare Important Message Given:  Yes    Colter Magowan [address] 02/01/2019, 11:55 AM

## 2019-02-01 NOTE — Progress Notes (Signed)
Subjective: Interval History: has no complaint , had colonoscopy.  Objective: Vital signs in last 24 hours: Temp:  [98.2 F (36.8 C)-99.1 F (37.3 C)] 98.6 F (37 C) (06/18 1316) Pulse Rate:  [67-84] 75 (06/18 1316) Resp:  [14-19] 18 (06/18 1316) BP: (93-116)/(48-74) 116/74 (06/18 1316) SpO2:  [97 %-100 %] 100 % (06/18 1316) Weight:  [51.5 kg] 51.5 kg (06/18 1035) Weight change: 0.3 kg  Intake/Output from previous day: 06/17 0701 - 06/18 0700 In: 240 [P.O.:240] Out: 0  Intake/Output this shift: No intake/output data recorded.  General appearance: alert, cooperative and no distress Resp: clear to auscultation bilaterally Cardio: S1, S2 normal and systolic murmur: systolic ejection 2/6, crescendo and decrescendo at 2nd left intercostal space GI: soft,pos bs, renal TX RLQ Extremities: extremities normal, atraumatic, no cyanosis or edema  AVG LLA  Lab Results: Recent Labs    01/31/19 0804 02/01/19 0520  WBC 8.7 8.7  HGB 7.7* 8.2*  HCT 22.8* 24.4*  PLT 71* 70*   BMET:  Recent Labs    01/31/19 0804 02/01/19 0520  NA 139 141  K 3.4* 3.4*  CL 104 104  CO2 22 24  GLUCOSE 199* 121*  BUN 51* 58*  CREATININE 6.99* 8.87*  CALCIUM 7.3* 7.4*   Recent Labs    01/29/19 1626  PTH 17   Iron Studies:  Recent Labs    01/29/19 1624  IRON 187*  TIBC 237*    Studies/Results: No results found.  I have reviewed the patient's current medications.  Assessment/Plan: 1 ESRD HD today 2 Anemai esa/Fe 3 HPTH vit D 4 Failing tx, taper meds 5 D per GI P HD, esa, taper meds.    LOS: 3 days   [name] 02/01/2019,2:01 PM

## 2019-02-01 NOTE — Progress Notes (Signed)
PROGRESS NOTE  [name] STM:[id number] DOB: February 22, 1971 DOA: 01/29/2019 PCP: [name], NP   LOS: 3 days   Patient is from: Home  Brief Narrative / Interim history: 48 year old male with history of ESRD status post second renal transplant in 2015, DM-2, anemia of chronic disease, hyperlipidemia and noncompliance admitted to ICU with diarrhea, leukocytosis, acute on chronic renal failure with profound anion gap metabolic acidosis and hyperkalemia on 01/29/2019.  This is a second presentation with dehydration/acidosis and ongoing diarrhea.  In ED, WBC 26.2.  Potassium 6.6.  Serum creatinine 11.82 (baseline about 4).  CO2 less than 7.  Anion gap too high to calculate with a glucose of 349.  VBG 6.9/19.7/71/< 5.  COVID-19 negative.  Patient was started on vancomycin and Zosyn and admitted to ICU.  Nephrology and GI consulted.  Patient was continued on Zosyn from 6/15 to 6/17 which was later discontinued after negative cultures.  GI consulted for his ongoing diarrhea.  C. difficile and GI panel negative.  Nephrology consulted for ESRD.  Subjective: No major events overnight of this morning.  He says he has not had a bowel movement since about 8 PM last night.  Reports some emesis after bowel prep.  Denies abdominal pain or fever.  Denies chest pain or dyspnea.   Assessment & Plan: ESRD status post second transplant in 2015-no signs of rejection on biopsy in May 2020.  On tacrolimus, Myfortic and dapsone at home. -Nephrology following -Received HD on 6/16 -On Prograf and dapsone prophylaxis per nephrology  Gastroenteritis/diarrhea: Diarrhea is an ongoing issue for about 1.5 months now.  Unclear etiology of this.  CT abdomen on admission showed sigmoid colitis.  Lipase elevated to 244 on admission. -GI following-plan for colonoscopy 6/18 -Continue Lomotil and Florastor-started in ICU. -Recheck lipase  DKA/IDDM-2 with ESRD and retinopathy: DKA likely due to diarrhea/dehydration.   DKA resolved. -Continue CBG monitoring and sliding scale insulin.  Anion gap metabolic acidosis: Combination of daily and ESRD.  Resolved.  Leukocytosis: Resolved.  Diastolic CHF: Echo in 8921 with EF of 55 to 60% but no other significant finding.  Not on diuretics at home.  Euvolemic. -Fluid management by dialysis  Hypotension: Resolved. -Home Coreg on hold. -Midodrine  Anemia of chronic disease: Hemoglobin 10.9 on admission and dropped to 5.9 which is likely dilutional after initial hemoconcentration.  Received 1 unit with appropriate response.  H&H stable now. -Nephrology on board  Thrombocytopenia: Stable after initial drop likely from dilution. -Continue monitoring  Moderate protein calorie malnutrition: Likely due to acute on chronic illness.  BMI 18.9. -Consult dietitian  Scheduled Meds: . Chlorhexidine Gluconate Cloth  6 each Topical Q0600  . Chlorhexidine Gluconate Cloth  6 each Topical Q0600  . dapsone  50 mg Oral Daily  . diphenoxylate-atropine  1 tablet Oral QID  . feeding supplement (NEPRO CARB STEADY)  237 mL Oral TID BM  . heparin injection (subcutaneous)  5,000 Units Subcutaneous Q8H  . insulin aspart  0-15 Units Subcutaneous TID WC  . insulin aspart  0-5 Units Subcutaneous QHS  . mouth rinse  15 mL Mouth Rinse BID  . midodrine  5 mg Oral TID WC  . multivitamin  1 tablet Oral QHS  . saccharomyces boulardii  250 mg Oral BID  . tacrolimus  3 mg Oral BID   Continuous Infusions: . sodium chloride     PRN Meds:.ondansetron (ZOFRAN) IV   DVT prophylaxis: Subcu heparin Code Status: Full code Family Communication: Patient update family and let me  know if any question. Disposition Plan: Remains inpatient  Consultants:   GI  Nephrology  Procedures:   HD  Microbiology: . Blood cultures negative . C. difficile and GI panel negative . COVID-19 negative x2.  Antimicrobials: Anti-infectives (From admission, onward)   Start     Dose/Rate Route  Frequency Ordered Stop   01/30/19 1000  dapsone tablet 50 mg     50 mg Oral Daily 01/29/19 1544     01/29/19 1615  piperacillin-tazobactam (ZOSYN) IVPB 2.25 g  Status:  Discontinued     2.25 g 100 mL/hr over 30 Minutes Intravenous Every 6 hours 01/29/19 1603 01/31/19 0816   01/29/19 1415  vancomycin (VANCOCIN) 1,250 mg in sodium chloride 0.9 % 250 mL IVPB  Status:  Discontinued     1,250 mg 166.7 mL/hr over 90 Minutes Intravenous  Once 01/29/19 1414 01/29/19 1544   01/29/19 1415  piperacillin-tazobactam (ZOSYN) IVPB 3.375 g  Status:  Discontinued     3.375 g 100 mL/hr over 30 Minutes Intravenous  Once 01/29/19 1414 01/29/19 1544       Objective: Vitals:   01/31/19 1317 01/31/19 1641 01/31/19 2108 02/01/19 0356  BP: 126/89 100/60 100/60 98/65  Pulse: 72 72 79 72  Resp: 18 18 14 16   Temp:   98.4 F (36.9 C) 98.2 F (36.8 C)  TempSrc:   Oral Oral  SpO2: 98%  97% 98%  Weight:    51.5 kg  Height:        Intake/Output Summary (Last 24 hours) at 02/01/2019 0710 Last data filed at 02/01/2019 0132 Gross per 24 hour  Intake 240 ml  Output 0 ml  Net 240 ml   Filed Weights   01/30/19 1737 01/31/19 0500 02/01/19 0356  Weight: 51.2 kg 51.3 kg 51.5 kg    Examination:  GENERAL: No acute distress.  Appears well.  HEENT: MMM.  Vision and hearing grossly intact.  NECK: Supple.  No JVD.  LUNGS:  No IWOB. Good air movement bilaterally. HEART:  RRR. Heart sounds normal.  2/6 SEM over RUSB. ABD: Bowel sounds present. Soft. Non tender.  MSK/EXT:  Moves all extremities. No apparent deformity. No edema bilaterally.  AVF over left upper extremity with good bruits. SKIN: no apparent skin lesion or wound NEURO: Awake, alert and oriented appropriately.  No gross deficit.  PSYCH: Calm. Normal affect.    Data Reviewed: I have independently reviewed following labs and imaging studies  CBC: Recent Labs  Lab 01/29/19 1245 01/29/19 1405 01/30/19 1054 01/30/19 1624 01/31/19 0804 02/01/19  0520  WBC 26.2*  --  9.5 9.5 8.7 8.7  NEUTROABS 24.1*  --   --   --   --   --   HGB 9.2* 10.9* 5.9* 5.9* 7.7* 8.2*  HCT 30.2* 32.0* 18.5* 18.3* 22.8* 24.4*  MCV 90.1  --  84.1 83.6 82.9 84.1  PLT 127*  --  78* 81* 71* 70*   Basic Metabolic Panel: Recent Labs  Lab 01/29/19 1245  01/29/19 1915 01/30/19 0008 01/30/19 0351 01/31/19 0804 02/01/19 0520  NA 135   < > 139 139 140 139 141  K 6.6*   < > 5.3* 4.1 3.8 3.4* 3.4*  CL 117*   < > 115* 107 107 104 104  CO2 <7*   < > 11* 12* 17* 22 24  GLUCOSE 349*   < > 147* 205* 146* 199* 121*  BUN 100*   < > 95* 91* 93* 51* 58*  CREATININE 11.82*   < >  10.69* 10.06* 9.81* 6.99* 8.87*  CALCIUM 9.0   < > 8.8* 7.8* 7.7* 7.3* 7.4*  MG 1.5*  --   --   --  1.1*  --  2.1  PHOS  --   --   --   --  6.1*  --  5.3*   < > = values in this interval not displayed.   GFR: Estimated Creatinine Clearance: 7.3 mL/min (A) (by C-G formula based on SCr of 8.87 mg/dL (H)). Liver Function Tests: Recent Labs  Lab 01/29/19 1245 01/30/19 0351 02/01/19 0520  AST 8*  --   --   ALT 13  --   --   ALKPHOS 118  --   --   BILITOT 0.5  --   --   PROT 7.0  --   --   ALBUMIN 4.4 3.0* 3.3*   Recent Labs  Lab 01/29/19 1626  LIPASE 244*   No results for input(s): AMMONIA in the last 168 hours. Coagulation Profile: No results for input(s): INR, PROTIME in the last 168 hours. Cardiac Enzymes: Recent Labs  Lab 01/29/19 1245  TROPONINI <0.03   BNP (last 3 results) No results for input(s): PROBNP in the last 8760 hours. HbA1C: No results for input(s): HGBA1C in the last 72 hours. CBG: Recent Labs  Lab 01/31/19 0808 01/31/19 1147 01/31/19 1642 01/31/19 2106 02/01/19 0706  GLUCAP 160* 151* 168* 139* 125*   Lipid Profile: No results for input(s): CHOL, HDL, LDLCALC, TRIG, CHOLHDL, LDLDIRECT in the last 72 hours. Thyroid Function Tests: No results for input(s): TSH, T4TOTAL, FREET4, T3FREE, THYROIDAB in the last 72 hours. Anemia Panel: Recent Labs     01/29/19 1624  TIBC 237*  IRON 187*   Urine analysis:    Component Value Date/Time   COLORURINE YELLOW 12/25/2018 0200   APPEARANCEUR CLOUDY (A) 12/25/2018 0200   LABSPEC 1.012 12/25/2018 0200   PHURINE 5.0 12/25/2018 0200   GLUCOSEU NEGATIVE 12/25/2018 0200   HGBUR MODERATE (A) 12/25/2018 0200   BILIRUBINUR NEGATIVE 12/25/2018 0200   KETONESUR NEGATIVE 12/25/2018 0200   PROTEINUR 30 (A) 12/25/2018 0200   UROBILINOGEN 0.2 12/23/2008 1845   NITRITE NEGATIVE 12/25/2018 0200   LEUKOCYTESUR NEGATIVE 12/25/2018 0200   Sepsis Labs: Invalid input(s): PROCALCITONIN, LACTICIDVEN  Recent Results (from the past 240 hour(s))  SARS Coronavirus 2     Status: None   Collection Time: 01/29/19  3:30 PM  Result Value Ref Range Status   SARS Coronavirus 2 NOT DETECTED NOT DETECTED Final    Comment: (NOTE) SARS-CoV-2 target nucleic acids are NOT DETECTED. The SARS-CoV-2 RNA is generally detectable in upper and lower respiratory specimens during the acute phase of infection.  Negative  results do not preclude SARS-CoV-2 infection, do not rule out co-infections with other pathogens, and should not be used as the sole basis for treatment or other patient management decisions.  Negative results must be combined with clinical observations, patient history, and epidemiological information. The expected result is Not Detected. Fact Sheet for Patients: http://www.biofiredefense.com/wp-content/uploads/2020/03/BIOFIRE-COVID -19-patients.pdf Fact Sheet for Healthcare Providers: http://www.biofiredefense.com/wp-content/uploads/2020/03/BIOFIRE-COVID -19-hcp.pdf This test is not yet approved or cleared by the [place] and  has been authorized for detection and/or diagnosis of SARS-CoV-2 by FDA under an Emergency Use Authorization (EUA).  This EUA will remain in effec t (meaning this test can be used) for the duration of  the COVID-19 declaration under Section 564(b)(1) of the Act, 21 U.S.C.  section 360bbb-3(b)(1), unless the authorization is terminated or revoked sooner. Performed at  [facility], [place] [address] [place], [place] [address]   Blood culture (routine x 2)     Status: None (Preliminary result)   Collection Time: 01/29/19  4:26 PM   Specimen: BLOOD  Result Value Ref Range Status   Specimen Description BLOOD RIGHT ANTECUBITAL  Final   Special Requests   Final    BOTTLES DRAWN AEROBIC AND ANAEROBIC Blood Culture adequate volume   Culture   Final    NO GROWTH 2 DAYS Performed at [facility], [place] [address] [place], [place] [address]    Report Status PENDING  Incomplete  Blood culture (routine x 2)     Status: None (Preliminary result)   Collection Time: 01/29/19  6:55 PM   Specimen: BLOOD  Result Value Ref Range Status   Specimen Description BLOOD RIGHT ANTECUBITAL  Final   Special Requests   Final    BOTTLES DRAWN AEROBIC ONLY Blood Culture adequate volume   Culture   Final    NO GROWTH 2 DAYS Performed at [facility], [place] [address], [place], [place] [address]    Report Status PENDING  Incomplete  C difficile quick scan w PCR reflex     Status: None   Collection Time: 01/29/19  8:00 PM   Specimen: STOOL  Result Value Ref Range Status   C Diff antigen NEGATIVE NEGATIVE Final   C Diff toxin NEGATIVE NEGATIVE Final   C Diff interpretation No C. difficile detected.  Final    Comment: Performed at [facility], [place] [address] [place], [place] [address]  Gastrointestinal Panel by PCR , Stool     Status: None   Collection Time: 01/29/19  8:00 PM   Specimen: Stool  Result Value Ref Range Status   Campylobacter species NOT DETECTED NOT DETECTED Final   Plesimonas shigelloides NOT DETECTED NOT DETECTED Final   Salmonella species NOT DETECTED NOT DETECTED Final   Yersinia enterocolitica NOT DETECTED NOT DETECTED Final   Vibrio species NOT DETECTED NOT DETECTED Final   Vibrio cholerae NOT DETECTED NOT DETECTED Final    Enteroaggregative E coli (EAEC) NOT DETECTED NOT DETECTED Final   Enteropathogenic E coli (EPEC) NOT DETECTED NOT DETECTED Final   Enterotoxigenic E coli (ETEC) NOT DETECTED NOT DETECTED Final   Shiga like toxin producing E coli (STEC) NOT DETECTED NOT DETECTED Final   Shigella/Enteroinvasive E coli (EIEC) NOT DETECTED NOT DETECTED Final   Cryptosporidium NOT DETECTED NOT DETECTED Final   Cyclospora cayetanensis NOT DETECTED NOT DETECTED Final   Entamoeba histolytica NOT DETECTED NOT DETECTED Final   Giardia lamblia NOT DETECTED NOT DETECTED Final   Adenovirus F40/41 NOT DETECTED NOT DETECTED Final   Astrovirus NOT DETECTED NOT DETECTED Final   Norovirus GI/GII NOT DETECTED NOT DETECTED Final   Rotavirus A NOT DETECTED NOT DETECTED Final   Sapovirus (I, II, IV, and V) NOT DETECTED NOT DETECTED Final    Comment: Performed at [facility], [address] [place], [place] [address]  MRSA PCR Screening     Status: None   Collection Time: 01/29/19 10:50 PM   Specimen: Nasal Mucosa; Nasopharyngeal  Result Value Ref Range Status   MRSA by PCR NEGATIVE NEGATIVE Final    Comment:        The GeneXpert MRSA Assay (FDA approved for NASAL specimens only), is one component of a comprehensive MRSA colonization surveillance program. It is not intended to diagnose MRSA infection nor to guide or monitor treatment  for MRSA infections. Performed at [facility], [place] [address], [place], [place] [address]       Radiology Studies: No results found.  35 minutes with more than 50% spent in reviewing records, counseling patient and coordinating care.  [facility] Pager [phone]  If 7PM-7AM, please contact night-coverage [email or web address] Password [email or web address] 02/01/2019, 7:10 AM

## 2019-02-01 NOTE — Brief Op Note (Signed)
01/29/2019 - 02/01/2019  11:33 AM  PATIENT:  [name]  48 y.o. male  PRE-OPERATIVE DIAGNOSIS:  Diarrhea  POST-OPERATIVE DIAGNOSIS:  pan colitis  PROCEDURE:  Procedure(s): COLONOSCOPY WITH PROPOFOL (N/A) BIOPSY  SURGEON:  Surgeon(s) and Role:    Ronnette Juniper, MD - Primary  PHYSICIAN ASSISTANT:   ASSISTANTS: Burtis Junes, RN, Ladona Ridgel, Tech  ANESTHESIA:   MAC  EBL:  0 mL   BLOOD ADMINISTERED:none  DRAINS: none   LOCAL MEDICATIONS USED:  NONE  SPECIMEN:  Biopsy / Limited Resection  DISPOSITION OF SPECIMEN:  PATHOLOGY  COUNTS:  YES  TOURNIQUET:  * No tourniquets in log *  DICTATION: .Dragon Dictation  PLAN OF CARE: Admit to inpatient   PATIENT DISPOSITION:  PACU - hemodynamically stable.   Delay start of Pharmacological VTE agent (>24hrs) due to surgical blood loss or risk of bleeding: not applicable

## 2019-02-01 NOTE — Interval H&P Note (Signed)
History and Physical Interval Note: 48/male with chronic diarrhea, renal transplant, on immunosuppressives, thrombocytopenia for a diagnostic colonoscopy. 02/01/2019 11:02 AM  [name]  has presented today for colonoscopy, with the diagnosis of Diarrhea.  The various methods of treatment have been discussed with the patient and family. After consideration of risks, benefits and other options for treatment, the patient has consented to  Procedure(s): COLONOSCOPY WITH PROPOFOL (N/A) as a surgical intervention.  The patient's history has been reviewed, patient examined, no change in status, stable for surgery.  I have reviewed the patient's chart and labs.  Questions were answered to the patient's satisfaction.     Ronnette Juniper

## 2019-02-01 NOTE — Progress Notes (Signed)
Patient refused to go to HD.

## 2019-02-01 NOTE — Op Note (Signed)
Colonoscopy showed Diffuse erythematous, inflamed, friable mucosa and thickened folds compatible with pancolitis ?etiology. Biopsies taken from rectosigmoid, left and right colon and sent to pathology.  Recommendations: Start renal, low fiber diet. Colestipol 1 gm 2 pills BID.  Ronnette Juniper, MD

## 2019-02-01 NOTE — Anesthesia Postprocedure Evaluation (Signed)
Anesthesia Post Note  Patient: [name]  Procedure(s) Performed: COLONOSCOPY WITH PROPOFOL (N/A ) BIOPSY     Patient location during evaluation: Endoscopy Anesthesia Type: MAC Level of consciousness: awake and alert Pain management: pain level controlled Vital Signs Assessment: post-procedure vital signs reviewed and stable Respiratory status: spontaneous breathing, nonlabored ventilation and respiratory function stable Cardiovascular status: stable and blood pressure returned to baseline Postop Assessment: no apparent nausea or vomiting Anesthetic complications: no    Last Vitals:  Vitals:   02/01/19 0950 02/01/19 1035  BP: 113/73 102/70  Pulse: 84   Resp: 18 19  Temp: 36.8 C 37.3 C  SpO2: 100% 100%    Last Pain:  Vitals:   02/01/19 1035  TempSrc: Oral  PainSc: 0-No pain                 Lynda Rainwater

## 2019-02-01 NOTE — Transfer of Care (Signed)
Immediate Anesthesia Transfer of Care Note  Patient: [name]  Procedure(s) Performed: COLONOSCOPY WITH PROPOFOL (N/A ) BIOPSY  Patient Location: Endoscopy Unit  Anesthesia Type:MAC  Level of Consciousness: drowsy and patient cooperative  Airway & Oxygen Therapy: Patient Spontanous Breathing and Patient connected to nasal cannula oxygen  Post-op Assessment: Report given to RN, Post -op Vital signs reviewed and stable and Patient moving all extremities X 4  Post vital signs: Reviewed and stable  Last Vitals:  Vitals Value Taken Time  BP    Temp    Pulse    Resp    SpO2      Last Pain:  Vitals:   02/01/19 1035  TempSrc: Oral  PainSc: 0-No pain      Patients Stated Pain Goal: 0 (44/81/85 6314)  Complications: No apparent anesthesia complications

## 2019-02-01 NOTE — Progress Notes (Signed)
Patient refused to update his wife and said he is going to do later in the evening.

## 2019-02-01 NOTE — Op Note (Signed)
[facility] Patient Name: [name] Procedure Date : 02/01/2019 MRN: [id number] Attending MD: Ronnette Juniper , MD Date of Birth: 1971-02-21 CSN: [id number] Age: 48 Admit Type: Inpatient Procedure:                Colonoscopy Indications:              This is the patient's first colonoscopy, Abnormal                            CT of the GI tract, Diarrhea Providers:                Ronnette Juniper, MD, Jeanella Cara, RN, Ladona Ridgel, Technician Referring MD:              Medicines:                Monitored Anesthesia Care Complications:            No immediate complications. Estimated blood loss:                            Minimal. Estimated Blood Loss:     Estimated blood loss was minimal. Procedure:                Pre-Anesthesia Assessment:                           - Prior to the procedure, a History and Physical                            was performed, and patient medications and                            allergies were reviewed. The patient's tolerance of                            previous anesthesia was also reviewed. The risks                            and benefits of the procedure and the sedation                            options and risks were discussed with the patient.                            All questions were answered, and informed consent                            was obtained. Prior Anticoagulants: The patient has                            taken no previous anticoagulant or antiplatelet                            agents. ASA  Grade Assessment: III - A patient with                            severe systemic disease. After reviewing the risks                            and benefits, the patient was deemed in                            satisfactory condition to undergo the procedure.                           After obtaining informed consent, the colonoscope                            was passed under direct vision.  Throughout the                            procedure, the patient's blood pressure, pulse, and                            oxygen saturations were monitored continuously. The                            PCF-H190DL ([id number] Olympus pediatric colonscope                            was introduced through the anus and advanced to the                            the cecum, identified by appendiceal orifice and                            ileocecal valve. The colonoscopy was performed                            without difficulty. The patient tolerated the                            procedure well. The quality of the bowel                            preparation was adequate to identify polyps 6 mm                            and larger in size. Scope In: 11:13:15 AM Scope Out: 11:24:52 AM Scope Withdrawal Time: 0 hours 8 minutes 17 seconds  Total Procedure Duration: 0 hours 11 minutes 37 seconds  Findings:      The perianal and digital rectal examinations were normal.      A diffuse area of severely erythematous, granular, friable,inflamed,       nodular and thickened folds of the mucosa was found in the rectum, in       the recto-sigmoid colon and in the sigmoid colon. Biopsies were  taken       with a cold forceps for histology and sent to pathology as biopsies from       rectosigmoid.      A diffuse area of mildly erythematous, friable, inflamed and thickened       folds of the mucosa was found in the descending colon, at the splenic       flexure and in the transverse colon. Biopsies were taken with a cold       forceps for histology and sent to pathology as biopsies from left colon.      A patchy area of mildly erythematous, friable, inflamed and thickened       folds of the mucosa was found at the hepatic flexure, in the ascending       colon and in the cecum. Biopsies were taken with a cold forceps for       histology and sent to pathology as biopsies from right colon.      The retroflexed  view of the distal rectum and anal verge was normal and       showed no anal or rectal abnormalities. Impression:               - Erythematous, granular, inflamed, nodular and                            thickened folds of the mucosa in the rectum, in the                            recto-sigmoid colon and in the sigmoid colon.                            Biopsied.                           - Erythematous, inflamed and thickened folds of the                            mucosa in the descending colon, at the splenic                            flexure and in the transverse colon. Biopsied.                           - Erythematous, inflamed and thickened folds of the                            mucosa at the hepatic flexure, in the ascending                            colon and in the cecum. Biopsied.                           - The distal rectum and anal verge are normal on                            retroflexion view. Moderate Sedation:      Patient did not receive moderate sedation  for this procedure, but       instead received monitored anesthesia care. Recommendation:           - Renal, low fiber diet today.                           - Use Questran at 1 scoop (4 grams) PO BID today.                           - Await pathology results. Procedure Code(s):        --- Professional ---                           [phone], Colonoscopy, flexible; with biopsy, single                            or multiple Diagnosis Code(s):        --- Professional ---                           K62.89, Other specified diseases of anus and rectum                           K52.9, Noninfective gastroenteritis and colitis,                            unspecified                           K63.89, Other specified diseases of intestine                           R19.7, Diarrhea, unspecified                           R93.3, Abnormal findings on diagnostic imaging of                            other parts of digestive tract CPT  copyright 2019 American Medical Association. All rights reserved. The codes documented in this report are preliminary and upon coder review may  be revised to meet current compliance requirements. Ronnette Juniper, MD 02/01/2019 11:32:38 AM This report has been signed electronically. Number of Addenda: 0

## 2019-02-01 NOTE — Anesthesia Preprocedure Evaluation (Addendum)
Anesthesia Evaluation  Patient identified by MRN, date of birth, ID band Patient awake    Reviewed: Allergy & Precautions, H&P , NPO status , Patient's Chart, lab work & pertinent test results, reviewed documented beta blocker date and time   History of Anesthesia Complications (+) PONV  Airway Mallampati: II  TM Distance: >3 FB Neck ROM: Full    Dental no notable dental hx. (+) Edentulous Upper, Partial Upper, Dental Advisory Given   Pulmonary    Pulmonary exam normal breath sounds clear to auscultation       Cardiovascular hypertension, Pt. on medications and Pt. on home beta blockers +CHF  Normal cardiovascular exam Rhythm:Regular Rate:Normal  Hx CHF when presented in ARF 4-5 years ago   Neuro/Psych    GI/Hepatic   Endo/Other  diabetes, Well Controlled, Type 2, Oral Hypoglycemic Agents  Renal/GU Dialysis and [facility].  Diatek in place.     Musculoskeletal   Abdominal   Peds  Hematology   Anesthesia Other Findings   Reproductive/Obstetrics                             Anesthesia Physical  Anesthesia Plan  ASA: III  Anesthesia Plan: MAC   Post-op Pain Management:    Induction: Intravenous  PONV Risk Score and Plan: 2 and Ondansetron, Midazolam and Treatment may vary due to age or medical condition  Airway Management Planned: Simple Face Mask  Additional Equipment:   Intra-op Plan:   Post-operative Plan:   Informed Consent: I have reviewed the patients History and Physical, chart, labs and discussed the procedure including the risks, benefits and alternatives for the proposed anesthesia with the patient or authorized representative who has indicated his/her understanding and acceptance.       Plan Discussed with: CRNA and Surgeon  Anesthesia Plan Comments:        Anesthesia Quick Evaluation

## 2019-02-02 ENCOUNTER — Inpatient Hospital Stay (HOSPITAL_COMMUNITY): Payer: Medicare Other

## 2019-02-02 DIAGNOSIS — I361 Nonrheumatic tricuspid (valve) insufficiency: Secondary | ICD-10-CM

## 2019-02-02 LAB — RENAL FUNCTION PANEL
Albumin: 3.3 g/dL — ABNORMAL LOW (ref 3.5–5.0)
Anion gap: 13 (ref 5–15)
BUN: 62 mg/dL — ABNORMAL HIGH (ref 6–20)
CO2: 23 mmol/L (ref 22–32)
Calcium: 7.5 mg/dL — ABNORMAL LOW (ref 8.9–10.3)
Chloride: 101 mmol/L (ref 98–111)
Creatinine, Ser: 9.79 mg/dL — ABNORMAL HIGH (ref 0.61–1.24)
GFR calc Af Amer: 6 mL/min — ABNORMAL LOW (ref >60)
GFR calc non Af Amer: 6 mL/min — ABNORMAL LOW (ref >60)
Glucose, Bld: 114 mg/dL — ABNORMAL HIGH (ref 70–99)
Phosphorus: 5 mg/dL — ABNORMAL HIGH (ref 2.5–4.6)
Potassium: 3.1 mmol/L — ABNORMAL LOW (ref 3.5–5.1)
Sodium: 137 mmol/L (ref 135–145)

## 2019-02-02 LAB — CBC
HCT: 23.6 % — ABNORMAL LOW (ref 39.0–52.0)
Hemoglobin: 7.9 g/dL — ABNORMAL LOW (ref 13.0–17.0)
MCH: 28.2 pg (ref 26.0–34.0)
MCHC: 33.5 g/dL (ref 30.0–36.0)
MCV: 84.3 fL (ref 80.0–100.0)
Platelets: 83 10*3/uL — ABNORMAL LOW (ref 150–400)
RBC: 2.8 MIL/uL — ABNORMAL LOW (ref 4.22–5.81)
RDW: 15.4 % (ref 11.5–15.5)
WBC: 10 10*3/uL (ref 4.0–10.5)
nRBC: 0 % (ref 0.0–0.2)

## 2019-02-02 LAB — ECHOCARDIOGRAM COMPLETE
Height: 65 in
Weight: 1788.37 oz

## 2019-02-02 LAB — APTT: aPTT: 31 seconds (ref 24–36)

## 2019-02-02 LAB — GLUCOSE, CAPILLARY
Glucose-Capillary: 120 mg/dL — ABNORMAL HIGH (ref 70–99)
Glucose-Capillary: 140 mg/dL — ABNORMAL HIGH (ref 70–99)
Glucose-Capillary: 116 mg/dL — ABNORMAL HIGH (ref 70–99)
Glucose-Capillary: 147 mg/dL — ABNORMAL HIGH (ref 70–99)

## 2019-02-02 LAB — MAGNESIUM: Magnesium: 2 mg/dL (ref 1.7–2.4)

## 2019-02-02 MED ORDER — DARBEPOETIN ALFA 40 MCG/0.4ML IJ SOSY
40.0000 ug | PREFILLED_SYRINGE | Freq: Once | INTRAMUSCULAR | Status: DC
  Filled 2019-02-02: qty 0.4

## 2019-02-02 MED ORDER — DARBEPOETIN ALFA 40 MCG/0.4ML IJ SOSY
40.0000 ug | PREFILLED_SYRINGE | INTRAMUSCULAR | Status: AC
  Administered 2019-02-03: 40 ug via INTRAVENOUS
  Filled 2019-02-02: qty 0.4

## 2019-02-02 NOTE — Progress Notes (Signed)
Nutrition Follow-up  DOCUMENTATION CODES:   Severe malnutrition in context of chronic illness  INTERVENTION:   Modify diet to SOFT (easy to digest, low fiber) with 1200 mL fluid restriction  Recommend checking Vitamin C and Zinc level and supplementing if deficient  Snacks TID between meals  Continue Nepro Shake po TID, each supplement provides 425 kcal and 19 grams protein   NUTRITION DIAGNOSIS:   Severe Malnutrition related to chronic illness(CKD) as evidenced by severe fat depletion, severe muscle depletion, percent weight loss(9% weight loss within 1 month).  Being addressed via snacks, supplements, diet liberalization  GOAL:   Patient will meet greater than or equal to 90% of their needs  Progressing  MONITOR:   PO intake, Supplement acceptance, Labs  REASON FOR ASSESSMENT:   Malnutrition Screening Tool    ASSESSMENT:   48 yo male admitted with acute on chronic renal failure, diarrhea, leukocytosis. PMH of DM, HTN, ESRD S/P kidney transplant 2015 now with CKD, medical noncompliance, B-12 deficiency, anemia.  6/18 Colonoscopy: diffuse erythematous, inflamed, friable mucosa and thickened folds compatible with pan colitis, biopsies taken, etiology unknown  Vomited large amount of colonoscopy prep yesterday  Pt reports appetite is ok, not eating well cause the food has no seasoning/no taste. Pt reports he is eating >50% of meals but<100%. Pt reports he has not tried the Nepro shakes so will continue for now. Pt is agreeable to snacks TID between meals to promote po intake  GI recommending low fiber diet; potassium low, phosphorus acceptable for HD. Recommend liberalizing diet to Soft only with 1200 mL fluid restriction  Labs: potassium 3.1 (L), phosphorus 5.0 (acceptable for HD) Meds: Rena-Vit, Prograf, florastor, zofran prn, midodrine, colestid    Diet Order:   Diet Order            DIET SOFT Room service appropriate? Yes; Fluid consistency: Thin; Fluid  restriction: 1200 mL Fluid  Diet effective now              EDUCATION NEEDS:   Not appropriate for education at this time  Skin:  Skin Assessment: Skin Integrity Issues: Skin Integrity Issues:: Other (Comment) Stage II: buttocks x 3 Other: MASD: groin, sacrum, perineum  Last BM:  6/17 diarrhea  Height:   Ht Readings from Last 1 Encounters:  02/01/19 5\' 5"  (1.651 m)    Weight:   Wt Readings from Last 1 Encounters:  02/01/19 50.7 kg    Ideal Body Weight:  61.8 kg  BMI:  Body mass index is 18.6 kg/m.  Estimated Nutritional Needs:   Kcal:  7408-1448  Protein:  75-90 gm  Fluid:  1 L +UOP  Cate Leonor Darnell MS, RDN, LDN, CNSC [phone] Pager  [phone] Weekend/On-Call Pager

## 2019-02-02 NOTE — Progress Notes (Signed)
Subjective: Reorts having 3 loose bowel movements after colonoscopy yesterday.  Objective: Vital signs in last 24 hours: Temp:  [98.1 F (36.7 C)-98.9 F (37.2 C)] 98.6 F (37 C) (06/19 0924) Pulse Rate:  [62-76] 76 (06/19 0924) Resp:  [18] 18 (06/19 0924) BP: (98-115)/(63-65) 115/63 (06/19 0924) SpO2:  [95 %-97 %] 97 % (06/19 0924) Weight:  [50.7 kg] 50.7 kg (06/18 2038) Weight change: 0 kg Last BM Date: 01/31/19  PE: Pleasant but chronically ill-appearing, malnourished GENERAL: Mild pallor, no icterus ABDOMEN:non Distended EXTREMITIES: no Deformity  Lab Results: Results for orders placed or performed during the hospital encounter of 01/29/19 (from the past 48 hour(s))  Glucose, capillary     Status: Abnormal   Collection Time: 01/31/19  4:42 PM  Result Value Ref Range   Glucose-Capillary 168 (H) 70 - 99 mg/dL  Glucose, capillary     Status: Abnormal   Collection Time: 01/31/19  9:06 PM  Result Value Ref Range   Glucose-Capillary 139 (H) 70 - 99 mg/dL  Renal function panel     Status: Abnormal   Collection Time: 02/01/19  5:20 AM  Result Value Ref Range   Sodium 141 135 - 145 mmol/L   Potassium 3.4 (L) 3.5 - 5.1 mmol/L   Chloride 104 98 - 111 mmol/L   CO2 24 22 - 32 mmol/L   Glucose, Bld 121 (H) 70 - 99 mg/dL   BUN 58 (H) 6 - 20 mg/dL   Creatinine, Ser 8.87 (H) 0.61 - 1.24 mg/dL   Calcium 7.4 (L) 8.9 - 10.3 mg/dL   Phosphorus 5.3 (H) 2.5 - 4.6 mg/dL   Albumin 3.3 (L) 3.5 - 5.0 g/dL   GFR calc non Af Amer 6 (L) >60 mL/min   GFR calc Af Amer 7 (L) >60 mL/min   Anion gap 13 5 - 15    Comment: Performed at [facility], [address] [place], [place] [address]  Magnesium     Status: None   Collection Time: 02/01/19  5:20 AM  Result Value Ref Range   Magnesium 2.1 1.7 - 2.4 mg/dL    Comment: Performed at [facility] [address], [place], [place] [address]  APTT     Status: None   Collection Time: 02/01/19  5:20 AM  Result Value Ref Range   aPTT  34 24 - 36 seconds    Comment: Performed at [facility] [address], [place], [place] [address]  CBC     Status: Abnormal   Collection Time: 02/01/19  5:20 AM  Result Value Ref Range   WBC 8.7 4.0 - 10.5 K/uL   RBC 2.90 (L) 4.22 - 5.81 MIL/uL   Hemoglobin 8.2 (L) 13.0 - 17.0 g/dL   HCT 24.4 (L) 39.0 - 52.0 %   MCV 84.1 80.0 - 100.0 fL   MCH 28.3 26.0 - 34.0 pg   MCHC 33.6 30.0 - 36.0 g/dL   RDW 15.4 11.5 - 15.5 %   Platelets 70 (L) 150 - 400 K/uL    Comment: REPEATED TO VERIFY Immature Platelet Fraction may be clinically indicated, consider ordering this additional test [id number] CONSISTENT WITH PREVIOUS RESULT    nRBC 0.0 0.0 - 0.2 %    Comment: Performed at [facility], [place] [address] [place], [place] [address]  Lipase, blood     Status: Abnormal   Collection Time: 02/01/19  5:20 AM  Result Value Ref Range   Lipase 71 (H)  11 - 51 U/L    Comment: Performed at [facility], [place] [address] [place], [place] [address]  Glucose, capillary     Status: Abnormal   Collection Time: 02/01/19  7:06 AM  Result Value Ref Range   Glucose-Capillary 125 (H) 70 - 99 mg/dL  Glucose, capillary     Status: Abnormal   Collection Time: 02/01/19 10:32 AM  Result Value Ref Range   Glucose-Capillary 115 (H) 70 - 99 mg/dL  Glucose, capillary     Status: Abnormal   Collection Time: 02/01/19  1:18 PM  Result Value Ref Range   Glucose-Capillary 108 (H) 70 - 99 mg/dL  Glucose, capillary     Status: Abnormal   Collection Time: 02/01/19  5:09 PM  Result Value Ref Range   Glucose-Capillary 155 (H) 70 - 99 mg/dL  Glucose, capillary     Status: Abnormal   Collection Time: 02/01/19  9:08 PM  Result Value Ref Range   Glucose-Capillary 118 (H) 70 - 99 mg/dL  Renal function panel (daily at 0500)     Status: Abnormal   Collection Time: 02/02/19  5:43 AM  Result Value Ref Range   Sodium 137 135 - 145 mmol/L   Potassium 3.1 (L) 3.5 - 5.1 mmol/L   Chloride 101 98 - 111 mmol/L    CO2 23 22 - 32 mmol/L   Glucose, Bld 114 (H) 70 - 99 mg/dL   BUN 62 (H) 6 - 20 mg/dL   Creatinine, Ser 9.79 (H) 0.61 - 1.24 mg/dL   Calcium 7.5 (L) 8.9 - 10.3 mg/dL   Phosphorus 5.0 (H) 2.5 - 4.6 mg/dL   Albumin 3.3 (L) 3.5 - 5.0 g/dL   GFR calc non Af Amer 6 (L) >60 mL/min   GFR calc Af Amer 6 (L) >60 mL/min   Anion gap 13 5 - 15    Comment: Performed at [facility], [address] [place], [place] [address]  Magnesium     Status: None   Collection Time: 02/02/19  5:43 AM  Result Value Ref Range   Magnesium 2.0 1.7 - 2.4 mg/dL    Comment: Performed at [facility] [address], [place], [place] [address]  APTT     Status: None   Collection Time: 02/02/19  5:43 AM  Result Value Ref Range   aPTT 31 24 - 36 seconds    Comment: Performed at [facility] [address], [place], [place] [address]  CBC     Status: Abnormal   Collection Time: 02/02/19  5:43 AM  Result Value Ref Range   WBC 10.0 4.0 - 10.5 K/uL   RBC 2.80 (L) 4.22 - 5.81 MIL/uL   Hemoglobin 7.9 (L) 13.0 - 17.0 g/dL   HCT 23.6 (L) 39.0 - 52.0 %   MCV 84.3 80.0 - 100.0 fL   MCH 28.2 26.0 - 34.0 pg   MCHC 33.5 30.0 - 36.0 g/dL   RDW 15.4 11.5 - 15.5 %   Platelets 83 (L) 150 - 400 K/uL    Comment: REPEATED TO VERIFY Immature Platelet Fraction may be clinically indicated, consider ordering this additional test [id number] CONSISTENT WITH PREVIOUS RESULT    nRBC 0.0 0.0 - 0.2 %    Comment: Performed at [facility], [place] [address], [place], [place] [address]  Glucose, capillary     Status: Abnormal   Collection Time: 02/02/19  7:00 AM  Result Value Ref Range  Glucose-Capillary 120 (H) 70 - 99 mg/dL  Glucose, capillary     Status: Abnormal   Collection Time: 02/02/19 11:28 AM  Result Value Ref Range   Glucose-Capillary 140 (H) 70 - 99 mg/dL    Studies/Results: No results found.  Medications: I have reviewed the patient's current medications.  Assessment: 48 year old  African-American gentleman with end-stage renal disease on hemodialysis, history of renal transplant 5 years ago on mycophenolate and tacrolimus, with chronic severe diarrhea causing frequent dehydration and acidosis.   Unremarkable stool studies for infectious work-up from 01/29/2019. Colonoscopy yesterday showed mild diffuse colitis, biopsies taken, results pending.  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  Etiology?ischemic Recommend low fiber diet and continue colestipol 2 g twice a day, dose to be titrated according to bowel movements. Continue Lomotil 1 tablet 4 times a day for now.   Ronnette Juniper 02/02/2019, 1:23 PM   Pager [phone] If no answer or after 5 PM call [phone]

## 2019-02-02 NOTE — Plan of Care (Signed)
  Problem: Education: Goal: Knowledge of General Education information will improve Description: Including pain rating scale, medication(s)/side effects and non-pharmacologic comfort measures Outcome: Progressing   Problem: Clinical Measurements: Goal: Ability to maintain clinical measurements within normal limits will improve Outcome: Progressing   Problem: Clinical Measurements: Goal: Diagnostic test results will improve Outcome: Progressing   Problem: Nutrition: Goal: Adequate nutrition will be maintained Outcome: Progressing   Problem: Coping: Goal: Level of anxiety will decrease Outcome: Progressing   Problem: Safety: Goal: Ability to remain free from injury will improve Outcome: Progressing

## 2019-02-02 NOTE — Progress Notes (Signed)
[facility]    NEPHROLOGY PROGRESS NOTE  SUBJECTIVE: Overall feeling well.  Denies any acute complaints.  Denies chest pain, shortness of breath, nausea, vomiting, diarrhea or dysuria.  All other review of systems are negative.  Has been refusing dialysis today.    OBJECTIVE:  Vitals:   02/02/19 0511 02/02/19 0924  BP: 104/64 115/63  Pulse: 65 76  Resp: 18 18  Temp: 98.9 F (37.2 C) 98.6 F (37 C)  SpO2: 95% 97%    Intake/Output Summary (Last 24 hours) at 02/02/2019 1621 Last data filed at 02/02/2019 1300 Gross per 24 hour  Intake 480 ml  Output 550 ml  Net -70 ml      General:  AAOx3 NAD HEENT: MMM [place] AT anicteric sclera Neck:  No JVD, no adenopathy CV:  Heart RRR  Lungs:  L/S CTA bilaterally Abd:  abd SNT/ND with normal BS GU:  Bladder non-palpable Extremities:  No LE edema. Skin:  No skin rash  MEDICATIONS:  . Chlorhexidine Gluconate Cloth  6 each Topical Q0600  . Chlorhexidine Gluconate Cloth  6 each Topical Q0600  . colestipol  2 g Oral BID  . dapsone  50 mg Oral Daily  . diphenoxylate-atropine  1 tablet Oral QID  . feeding supplement (NEPRO CARB STEADY)  237 mL Oral TID BM  . heparin injection (subcutaneous)  5,000 Units Subcutaneous Q8H  . insulin aspart  0-15 Units Subcutaneous TID WC  . insulin aspart  0-5 Units Subcutaneous QHS  . mouth rinse  15 mL Mouth Rinse BID  . midodrine  5 mg Oral TID WC  . multivitamin  1 tablet Oral QHS  . saccharomyces boulardii  250 mg Oral BID  . tacrolimus  3 mg Oral BID       LABS:   CBC Latest Ref Rng & Units 02/02/2019 02/01/2019 01/31/2019  WBC 4.0 - 10.5 K/uL 10.0 8.7 8.7  Hemoglobin 13.0 - 17.0 g/dL 7.9(L) 8.2(L) 7.7(L)  Hematocrit 39.0 - 52.0 % 23.6(L) 24.4(L) 22.8(L)  Platelets 150 - 400 K/uL 83(L) 70(L) 71(L)    CMP Latest Ref Rng & Units 02/02/2019 02/01/2019 01/31/2019  Glucose 70 - 99 mg/dL 114(H) 121(H) 199(H)  BUN 6 - 20 mg/dL 62(H) 58(H) 51(H)  Creatinine 0.61 - 1.24 mg/dL 9.79(H)  8.87(H) 6.99(H)  Sodium 135 - 145 mmol/L 137 141 139  Potassium 3.5 - 5.1 mmol/L 3.1(L) 3.4(L) 3.4(L)  Chloride 98 - 111 mmol/L 101 104 104  CO2 22 - 32 mmol/L 23 24 22   Calcium 8.9 - 10.3 mg/dL 7.5(L) 7.4(L) 7.3(L)  Total Protein 6.5 - 8.1 g/dL - - -  Total Bilirubin 0.3 - 1.2 mg/dL - - -  Alkaline Phos 38 - 126 U/L - - -  AST 15 - 41 U/L - - -  ALT 0 - 44 U/L - - -    Lab Results  Component Value Date   PTH 17 01/29/2019   CALCIUM 7.5 (L) 02/02/2019   CAION 1.32 01/29/2019   PHOS 5.0 (H) 02/02/2019       Component Value Date/Time   COLORURINE YELLOW 12/25/2018 0200   APPEARANCEUR CLOUDY (A) 12/25/2018 0200   LABSPEC 1.012 12/25/2018 0200   PHURINE 5.0 12/25/2018 0200   GLUCOSEU NEGATIVE 12/25/2018 0200   HGBUR MODERATE (A) 12/25/2018 0200   BILIRUBINUR NEGATIVE 12/25/2018 0200   KETONESUR NEGATIVE 12/25/2018 0200   PROTEINUR 30 (A) 12/25/2018 0200   UROBILINOGEN 0.2 12/23/2008 1845   NITRITE NEGATIVE 12/25/2018 0200   LEUKOCYTESUR NEGATIVE  12/25/2018 0200      Component Value Date/Time   PHART 7.305 (L) 01/30/2019 0345   PCO2ART  Below reportable range 01/30/2019 0345   PO2ART 156 (H) 01/30/2019 0345   HCO3 6.3 (L) 01/30/2019 0345   TCO2 <5 (L) 01/29/2019 1405   ACIDBASEDEF 19.4 (H) 01/30/2019 0345   O2SAT 98.5% 01/30/2019 0345       Component Value Date/Time   IRON 187 (H) 01/29/2019 1624   IRON 91 08/26/2016   TIBC 237 (L) 01/29/2019 1624   TIBC 183 08/26/2016   FERRITIN 701 (H) 12/26/2018 0046   FERRITIN 3,610 08/26/2016   IRONPCTSAT 79 (H) 01/29/2019 1624       ASSESSMENT/PLAN:    48 year old male patient with a past medical history significant for end-stage renal disease status post second renal transplantation in 2015, type 2 diabetes, anemia of chronic kidney disease, and hyperlipidemia who presented with diarrhea, leukocytosis, and pancolitis.  He was found to have acute kidney injury as well.  1.  End-stage renal disease.  He was scheduled for  dialysis today, but he has been refusing.  The patient does not yet want to start dialysis.  Will discuss again tomorrow.  We will continue to wean immunosuppressants.  Is off of Myfortic.  2.  Anemia.  Likely secondary to chronic kidney disease.  Continue Aranesp and iron supplementation as needed.  3.  Metabolic bone disease.  PTH is low.  We will hold off on treating phosphorus for now.     [place], DO, [place]

## 2019-02-02 NOTE — Plan of Care (Signed)
  Problem: Clinical Measurements: Goal: Ability to maintain clinical measurements within normal limits will improve Outcome: Progressing Goal: Diagnostic test results will improve Outcome: Progressing   Problem: Nutrition: Goal: Adequate nutrition will be maintained Outcome: Progressing   Problem: Coping: Goal: Level of anxiety will decrease Outcome: Progressing   Problem: Education: Goal: Knowledge of General Education information will improve Description: Including pain rating scale, medication(s)/side effects and non-pharmacologic comfort measures Outcome: Completed/Met   Problem: Safety: Goal: Ability to remain free from injury will improve Outcome: Completed/Met

## 2019-02-02 NOTE — Progress Notes (Signed)
Patient unable to give the Aranesp SQ right after I had asked the patient if the MD had come by and talked to him. I opened the package and was about to administer medication, patient refused to have any medication he is not taking currently as verbalized. Tried to explain about the benefits of Aranesp, to no avail. Had to waste Aranesp, since it was unwrapped.

## 2019-02-02 NOTE — Progress Notes (Signed)
PROGRESS NOTE  [name] BMW:[id number] DOB: 1971-04-27 DOA: 01/29/2019 PCP: [name], NP   LOS: 4 days   Brief narrative: 48 year old male with history of ESRD status post second renal transplant in 2015, DM-2, anemia of chronic disease, hyperlipidemia and noncompliance admitted to ICU with diarrhea, leukocytosis, acute on chronic renal failure with profound anion gap metabolic acidosis and hyperkalemia on 01/29/2019.  This is a second presentation with dehydration/acidosis and ongoing diarrhea.  In ED, WBC 26.2.  Potassium 6.6.  Serum creatinine 11.82 (baseline about 4).  CO2 less than 7.  Anion gap too high to calculate with a glucose of 349.  VBG 6.9/19.7/71/< 5.  COVID-19 negative.  Patient was started on vancomycin and Zosyn and admitted to ICU.  Nephrology and GI consulted.  Patient was continued on Zosyn from 6/15 to 6/17 which was later discontinued after negative cultures.  GI consulted for his ongoing diarrhea.  C. difficile and GI panel negative.  Nephrology consulted for ESRD.  Subjective: Patient was seen and examined .  Lying in bed.  Not in distress.  No new symptoms. Chart reviewed. T-max in 24 hours 99.1 Blood pressure in 90s Potassium low at 3.1 Creatinine elevated to 9.79 Hemoglobin low at 7.9 Colonoscopy on 6/18 showed erythematous, inflamed and thickened fold mucosa throughout the colon and rectum. GI pathogen panel negative  Assessment/Plan:  Active Problems:   Acute on chronic renal failure (HCC)   Pressure injury of skin   Protein-calorie malnutrition, severe  ESRD status post second transplant in 2015-no signs of rejection on biopsy in May 2020.  On tacrolimus, Myfortic and dapsone at home. -Nephrology following -Received HD on 6/16 -On Prograf and dapsone prophylaxis per nephrology  Gastroenteritis/diarrhea: Diarrhea is an ongoing issue for about 1.5 months now.  Unclear etiology of this.  CT abdomen on admission showed sigmoid colitis.   Lipase elevated to 244 on admission. - Colonoscopy on 6/18 showed erythematous, inflamed and thickened fold mucosa throughout the colon and rectum.  -GI recommended to low fiber diet, and colestipol 1 gm 2 pills BID. -Recheck lipase  DKA/IDDM-2 with ESRD and retinopathy: DKA likely due to diarrhea/dehydration.  DKA resolved. -Continue CBG monitoring and sliding scale insulin.  Anion gap metabolic acidosis: Combination of daily and ESRD.  Resolved.  Leukocytosis: Resolved.  Diastolic CHF: Echo in 2725 with EF of 55 to 60% but no other significant finding.  Not on diuretics at home.  Euvolemic. -Fluid management by dialysis  Hypotension: Resolved. -Home Coreg on hold. -Midodrine  Anemia of chronic disease: Hemoglobin 10.9 on admission and dropped to 5.9 which is likely dilutional after initial hemoconcentration.  Received 1 unit with appropriate response.  H&H stable now. -Nephrology on board  Thrombocytopenia: Stable after initial drop likely from dilution. -Continue monitoring  Moderate protein calorie malnutrition: Likely due to acute on chronic illness.  BMI 18.9. -Consult dietitian  Mobility: Encourage ambulation Diet: Soft diet DVT prophylaxis:  Subcu heparin Code Status:   Code Status: Full Code  Family Communication:  Expected Discharge:  Remains inpatient.  Consultants:   GI  Nephrology  Procedures:   HD  Microbiology:  Blood cultures negative  C. difficile and GI panel negative  COVID-19 negative x2.  Antimicrobials:  Anti-infectives (From admission, onward)   Start     Dose/Rate Route Frequency Ordered Stop   01/30/19 1000  dapsone tablet 50 mg     50 mg Oral Daily 01/29/19 1544     01/29/19 1615  piperacillin-tazobactam (ZOSYN) IVPB 2.25 g  Status:  Discontinued     2.25 g 100 mL/hr over 30 Minutes Intravenous Every 6 hours 01/29/19 1603 01/31/19 0816   01/29/19 1415  vancomycin (VANCOCIN) 1,250 mg in sodium chloride 0.9 % 250 mL IVPB   Status:  Discontinued     1,250 mg 166.7 mL/hr over 90 Minutes Intravenous  Once 01/29/19 1414 01/29/19 1544   01/29/19 1415  piperacillin-tazobactam (ZOSYN) IVPB 3.375 g  Status:  Discontinued     3.375 g 100 mL/hr over 30 Minutes Intravenous  Once 01/29/19 1414 01/29/19 1544      Infusions:    Scheduled Meds: . Chlorhexidine Gluconate Cloth  6 each Topical Q0600  . Chlorhexidine Gluconate Cloth  6 each Topical Q0600  . colestipol  2 g Oral BID  . dapsone  50 mg Oral Daily  . diphenoxylate-atropine  1 tablet Oral QID  . feeding supplement (NEPRO CARB STEADY)  237 mL Oral TID BM  . heparin injection (subcutaneous)  5,000 Units Subcutaneous Q8H  . insulin aspart  0-15 Units Subcutaneous TID WC  . insulin aspart  0-5 Units Subcutaneous QHS  . mouth rinse  15 mL Mouth Rinse BID  . midodrine  5 mg Oral TID WC  . multivitamin  1 tablet Oral QHS  . saccharomyces boulardii  250 mg Oral BID  . tacrolimus  3 mg Oral BID    PRN meds: ondansetron (ZOFRAN) IV   Objective: Vitals:   02/01/19 2038 02/02/19 0511  BP: 98/65 104/64  Pulse: 62 65  Resp: 18 18  Temp: 98.1 F (36.7 C) 98.9 F (37.2 C)  SpO2: 96% 95%    Intake/Output Summary (Last 24 hours) at 02/02/2019 0831 Last data filed at 02/02/2019 0511 Gross per 24 hour  Intake 0 ml  Output 550 ml  Net -550 ml   Filed Weights   02/01/19 0356 02/01/19 1035 02/01/19 2038  Weight: 51.5 kg 51.5 kg 50.7 kg   Weight change: 0 kg Body mass index is 18.6 kg/m.   Physical Exam: General exam: Appears calm and comfortable.  Skin: No rashes, lesions or ulcers. HEENT: Atraumatic, normocephalic, supple neck, no obvious bleeding Lungs: Clear to auscultation bilaterally CVS: Regular rate and rhythm, systolic ejection murmur GI/Abd -soft, nontender, nondistended, bowel sounds present CNS: Alert, awake and oriented x3 Psychiatry: Mood appropriate Extremities: No pedal edema, no calf tenderness  Data Review: I have personally  reviewed the laboratory data and studies available.  Recent Labs  Lab 01/29/19 1245  01/30/19 1054 01/30/19 1624 01/31/19 0804 02/01/19 0520 02/02/19 0543  WBC 26.2*  --  9.5 9.5 8.7 8.7 10.0  NEUTROABS 24.1*  --   --   --   --   --   --   HGB 9.2*   < > 5.9* 5.9* 7.7* 8.2* 7.9*  HCT 30.2*   < > 18.5* 18.3* 22.8* 24.4* 23.6*  MCV 90.1  --  84.1 83.6 82.9 84.1 84.3  PLT 127*  --  78* 81* 71* 70* 83*   < > = values in this interval not displayed.    Recent Labs  Lab 01/29/19 1245  01/30/19 0008 01/30/19 0351 01/31/19 0804 02/01/19 0520 02/02/19 0543  NA 135   < > 139 140 139 141 137  K 6.6*   < > 4.1 3.8 3.4* 3.4* 3.1*  CL 117*   < > 107 107 104 104 101  CO2 <7*   < > 12* 17* 22 24 23   GLUCOSE 349*   < >  205* 146* 199* 121* 114*  BUN 100*   < > 91* 93* 51* 58* 62*  CREATININE 11.82*   < > 10.06* 9.81* 6.99* 8.87* 9.79*  CALCIUM 9.0   < > 7.8* 7.7* 7.3* 7.4* 7.5*  MG 1.5*  --   --  1.1*  --  2.1 2.0  PHOS  --   --   --  6.1*  --  5.3* 5.0*   < > = values in this interval not displayed.    Terrilee Croak, MD  [facility] 02/02/2019

## 2019-02-03 LAB — CBC
HCT: 24.5 % — ABNORMAL LOW (ref 39.0–52.0)
Hemoglobin: 8.1 g/dL — ABNORMAL LOW (ref 13.0–17.0)
MCH: 27.9 pg (ref 26.0–34.0)
MCHC: 33.1 g/dL (ref 30.0–36.0)
MCV: 84.5 fL (ref 80.0–100.0)
Platelets: 72 10*3/uL — ABNORMAL LOW (ref 150–400)
RBC: 2.9 MIL/uL — ABNORMAL LOW (ref 4.22–5.81)
RDW: 15.1 % (ref 11.5–15.5)
WBC: 8.7 10*3/uL (ref 4.0–10.5)
nRBC: 0 % (ref 0.0–0.2)

## 2019-02-03 LAB — RENAL FUNCTION PANEL
Albumin: 3.3 g/dL — ABNORMAL LOW (ref 3.5–5.0)
Anion gap: 11 (ref 5–15)
BUN: 18 mg/dL (ref 6–20)
CO2: 25 mmol/L (ref 22–32)
Calcium: 7.6 mg/dL — ABNORMAL LOW (ref 8.9–10.3)
Chloride: 100 mmol/L (ref 98–111)
Creatinine, Ser: 4.39 mg/dL — ABNORMAL HIGH (ref 0.61–1.24)
GFR calc Af Amer: 17 mL/min — ABNORMAL LOW (ref >60)
GFR calc non Af Amer: 15 mL/min — ABNORMAL LOW (ref >60)
Glucose, Bld: 125 mg/dL — ABNORMAL HIGH (ref 70–99)
Phosphorus: 2.4 mg/dL — ABNORMAL LOW (ref 2.5–4.6)
Potassium: 3.2 mmol/L — ABNORMAL LOW (ref 3.5–5.1)
Sodium: 136 mmol/L (ref 135–145)

## 2019-02-03 LAB — APTT: aPTT: 33 seconds (ref 24–36)

## 2019-02-03 LAB — CULTURE, BLOOD (ROUTINE X 2)
Culture: NO GROWTH
Culture: NO GROWTH
Special Requests: ADEQUATE
Special Requests: ADEQUATE

## 2019-02-03 LAB — GLUCOSE, CAPILLARY
Glucose-Capillary: 119 mg/dL — ABNORMAL HIGH (ref 70–99)
Glucose-Capillary: 199 mg/dL — ABNORMAL HIGH (ref 70–99)
Glucose-Capillary: 116 mg/dL — ABNORMAL HIGH (ref 70–99)
Glucose-Capillary: 132 mg/dL — ABNORMAL HIGH (ref 70–99)

## 2019-02-03 LAB — MAGNESIUM: Magnesium: 1.8 mg/dL (ref 1.7–2.4)

## 2019-02-03 MED ORDER — DARBEPOETIN ALFA 40 MCG/0.4ML IJ SOSY
PREFILLED_SYRINGE | INTRAMUSCULAR | Status: AC
  Administered 2019-02-03: 40 ug via INTRAVENOUS
  Filled 2019-02-03: qty 0.4

## 2019-02-03 NOTE — Progress Notes (Signed)
[facility]    NEPHROLOGY PROGRESS NOTE  SUBJECTIVE: Overall feeling well.  Denies any acute complaints.  Denies chest pain, shortness of breath, nausea, vomiting, diarrhea or dysuria.  All other review of systems are negative.  Ultimately received dialysis late yesterday.    OBJECTIVE:  Vitals:   02/03/19 0437 02/03/19 0848  BP: 99/67 114/74  Pulse: 77 85  Resp: 17 18  Temp: 98.7 F (37.1 C) 99.2 F (37.3 C)  SpO2: 97% 100%    Intake/Output Summary (Last 24 hours) at 02/03/2019 1227 Last data filed at 02/03/2019 8315 Gross per 24 hour  Intake 780 ml  Output 300 ml  Net 480 ml      General:  AAOx3 NAD HEENT: MMM [place] AT anicteric sclera Neck:  No JVD, no adenopathy CV:  Heart RRR  Lungs:  L/S CTA bilaterally Abd:  abd SNT/ND with normal BS GU:  Bladder non-palpable Extremities:  No LE edema. Skin:  No skin rash  MEDICATIONS:  . colestipol  2 g Oral BID  . dapsone  50 mg Oral Daily  . diphenoxylate-atropine  1 tablet Oral QID  . feeding supplement (NEPRO CARB STEADY)  237 mL Oral TID BM  . heparin injection (subcutaneous)  5,000 Units Subcutaneous Q8H  . insulin aspart  0-15 Units Subcutaneous TID WC  . insulin aspart  0-5 Units Subcutaneous QHS  . mouth rinse  15 mL Mouth Rinse BID  . midodrine  5 mg Oral TID WC  . multivitamin  1 tablet Oral QHS  . saccharomyces boulardii  250 mg Oral BID  . tacrolimus  3 mg Oral BID       LABS:   CBC Latest Ref Rng & Units 02/03/2019 02/02/2019 02/01/2019  WBC 4.0 - 10.5 K/uL 8.7 10.0 8.7  Hemoglobin 13.0 - 17.0 g/dL 8.1(L) 7.9(L) 8.2(L)  Hematocrit 39.0 - 52.0 % 24.5(L) 23.6(L) 24.4(L)  Platelets 150 - 400 K/uL 72(L) 83(L) 70(L)    CMP Latest Ref Rng & Units 02/03/2019 02/02/2019 02/01/2019  Glucose 70 - 99 mg/dL 125(H) 114(H) 121(H)  BUN 6 - 20 mg/dL 18 62(H) 58(H)  Creatinine 0.61 - 1.24 mg/dL 4.39(H) 9.79(H) 8.87(H)  Sodium 135 - 145 mmol/L 136 137 141  Potassium 3.5 - 5.1 mmol/L 3.2(L) 3.1(L) 3.4(L)   Chloride 98 - 111 mmol/L 100 101 104  CO2 22 - 32 mmol/L 25 23 24   Calcium 8.9 - 10.3 mg/dL 7.6(L) 7.5(L) 7.4(L)  Total Protein 6.5 - 8.1 g/dL - - -  Total Bilirubin 0.3 - 1.2 mg/dL - - -  Alkaline Phos 38 - 126 U/L - - -  AST 15 - 41 U/L - - -  ALT 0 - 44 U/L - - -    Lab Results  Component Value Date   PTH 17 01/29/2019   CALCIUM 7.6 (L) 02/03/2019   CAION 1.32 01/29/2019   PHOS 2.4 (L) 02/03/2019       Component Value Date/Time   COLORURINE YELLOW 12/25/2018 0200   APPEARANCEUR CLOUDY (A) 12/25/2018 0200   LABSPEC 1.012 12/25/2018 0200   PHURINE 5.0 12/25/2018 0200   GLUCOSEU NEGATIVE 12/25/2018 0200   HGBUR MODERATE (A) 12/25/2018 0200   BILIRUBINUR NEGATIVE 12/25/2018 0200   KETONESUR NEGATIVE 12/25/2018 0200   PROTEINUR 30 (A) 12/25/2018 0200   UROBILINOGEN 0.2 12/23/2008 1845   NITRITE NEGATIVE 12/25/2018 0200   LEUKOCYTESUR NEGATIVE 12/25/2018 0200      Component Value Date/Time   PHART 7.305 (L) 01/30/2019 0345   PCO2ART  Below reportable range 01/30/2019 0345   PO2ART 156 (H) 01/30/2019 0345   HCO3 6.3 (L) 01/30/2019 0345   TCO2 <5 (L) 01/29/2019 1405   ACIDBASEDEF 19.4 (H) 01/30/2019 0345   O2SAT 98.5% 01/30/2019 0345       Component Value Date/Time   IRON 187 (H) 01/29/2019 1624   IRON 91 08/26/2016   TIBC 237 (L) 01/29/2019 1624   TIBC 183 08/26/2016   FERRITIN 701 (H) 12/26/2018 0046   FERRITIN 3,610 08/26/2016   IRONPCTSAT 79 (H) 01/29/2019 1624       ASSESSMENT/PLAN:    48 year old male patient with a past medical history significant for end-stage renal disease status post second renal transplantation in 2015, type 2 diabetes, anemia of chronic kidney disease, and hyperlipidemia who presented with diarrhea, leukocytosis, and pancolitis.  He was found to have acute kidney injury as well.  1.  End-stage renal disease.  Status post dialysis yesterday.  Will plan dialysis on a Monday, Wednesday, Friday schedule.  We will continue to wean  immunosuppressants.  Is off of Myfortic.  We will need to see [facility] for outpatient dialysis.  Has a left upper extremity AV graft.  2.  Anemia.  Likely secondary to chronic kidney disease.  Continue Aranesp and iron supplementation as needed.  3.  Metabolic bone disease.  PTH is low.  We will hold off on treating phosphorus for now.     [place], DO, [place]

## 2019-02-03 NOTE — Progress Notes (Signed)
PROGRESS NOTE  [name] HER:[id number] DOB: 22-Jun-1971 DOA: 01/29/2019 PCP: [name], NP   LOS: 5 days   Brief narrative: Patient is a 48 year old male with history of ESRD status post second renal transplant in 2015, DM-2, anemia of chronic disease, hyperlipidemiaandnoncompliance admitted to ICU with diarrhea, leukocytosis, acute on chronic renal failure with profound anion gap metabolic acidosis and hyperkalemia on 01/29/2019. This is a second presentation with dehydration/acidosis and ongoing diarrhea.  In ED,WBC 26.2. Potassium 6.6. Serum creatinine 11.82 (baseline about 4). CO2 less than 7. Anion gap too high to calculate with a glucose of 349. VBG 6.9/19.7/71/<5.COVID-19 negative. Patient was started on vancomycin and Zosyn and admitted to ICU. Nephrology and GI consulted.  Patient was continued on Zosyn from 6/15 to 6/17 which was later discontinued after negative cultures. GI consulted for his ongoing diarrhea. C. difficile and GI panel negative. Nephrology consulted for ESRD.  Subjective: Patient was seen and examined this morning.  Sitting up in bed.  Not in distress.  Underwent dialysis late last night.  Feels better.  On WelChol.  Diarrhea improving.  Only one episode of of soft stool last night.  Assessment/Plan:  Active Problems:   Acute on chronic renal failure (HCC)   Pressure injury of skin   Protein-calorie malnutrition, severe  ESRD s/p second transplant in 2015-no signs of rejection on biopsy in May 2020.On tacrolimus, Myfortic and dapsone at home. -Presented with AKI secondary to diarrhea.  AKI progressed to ESRD again.  Restarted on dialysis.  Being weaned off immunosuppressants gradually. -Nephrology following.  Gastroenteritis/diarrhea -Presented with diarrhea for about 1.5 months prior to presentation. Unclear etiology of this. CT abdomen on admission showed sigmoid colitis.Lipase elevated to 244 on admission. - Colonoscopy  on 6/18 showed erythematous, inflamed and thickened fold mucosa throughout the colon and rectum.  -GI recommended to low fiber diet, and colestipol 1 gm 2 pills BID. -Patient was also started on WelChol.  Diarrhea improving now.  Only one episode last night.  DKA/IDDM-2 with ESRD and retinopathy: DKA likely due to diarrhea/dehydration. DKA resolved. -Continue CBG monitoring and sliding scale insulin.  Anion gap metabolic acidosis: Combination of daily and ESRD.Resolved.  Leukocytosis: Resolved.  Diastolic CHF: Echo in 8563 with EF of 55 to 60% but no other significant finding.Not on diuretics at home. Euvolemic. -Fluid management by dialysis  Hypotension: Resolved. -Home Coreg on hold. -Midodrine  Anemia of chronic disease: Hemoglobin 10.9 on admission and dropped to 5.9 which is likely dilutionalafter initial hemoconcentration. Received 1 unit with appropriate response. H&H stable now. -Nephrology on board  Thrombocytopenia:Stable after initial drop likely from dilution. -Continue monitoring  Moderate protein calorie malnutrition: Likely due to acute on chronic illness. BMI 18.9. -Consult dietitian  Mobility: Encourage ambulation Diet: Soft diet DVT prophylaxis: Subcu heparin Code Status:  Code Status: Full Code  Family Communication: Expected Discharge: Remains inpatient.  Consultants:  GI  Nephrology  Procedures:  HD  Microbiology:  Blood cultures negative  C. difficile and GI panel negative  COVID-19 negative x2.   Antimicrobials:  Anti-infectives (From admission, onward)   Start     Dose/Rate Route Frequency Ordered Stop   01/30/19 1000  dapsone tablet 50 mg     50 mg Oral Daily 01/29/19 1544     01/29/19 1615  piperacillin-tazobactam (ZOSYN) IVPB 2.25 g  Status:  Discontinued     2.25 g 100 mL/hr over 30 Minutes Intravenous Every 6 hours 01/29/19 1603 01/31/19 0816   01/29/19 1415  vancomycin (VANCOCIN) 1,250  mg in sodium  chloride 0.9 % 250 mL IVPB  Status:  Discontinued     1,250 mg 166.7 mL/hr over 90 Minutes Intravenous  Once 01/29/19 1414 01/29/19 1544   01/29/19 1415  piperacillin-tazobactam (ZOSYN) IVPB 3.375 g  Status:  Discontinued     3.375 g 100 mL/hr over 30 Minutes Intravenous  Once 01/29/19 1414 01/29/19 1544      Infusions:    Scheduled Meds: . colestipol  2 g Oral BID  . dapsone  50 mg Oral Daily  . diphenoxylate-atropine  1 tablet Oral QID  . feeding supplement (NEPRO CARB STEADY)  237 mL Oral TID BM  . heparin injection (subcutaneous)  5,000 Units Subcutaneous Q8H  . insulin aspart  0-15 Units Subcutaneous TID WC  . insulin aspart  0-5 Units Subcutaneous QHS  . mouth rinse  15 mL Mouth Rinse BID  . midodrine  5 mg Oral TID WC  . multivitamin  1 tablet Oral QHS  . saccharomyces boulardii  250 mg Oral BID  . tacrolimus  3 mg Oral BID    PRN meds: ondansetron (ZOFRAN) IV   Objective: Vitals:   02/03/19 0437 02/03/19 0848  BP: 99/67 114/74  Pulse: 77 85  Resp: 17 18  Temp: 98.7 F (37.1 C) 99.2 F (37.3 C)  SpO2: 97% 100%    Intake/Output Summary (Last 24 hours) at 02/03/2019 1453 Last data filed at 02/03/2019 0927 Gross per 24 hour  Intake 540 ml  Output 300 ml  Net 240 ml   Filed Weights   02/02/19 2054 02/02/19 2225 02/03/19 0232  Weight: 51.5 kg 51 kg 50.7 kg   Weight change: 0 kg Body mass index is 18.6 kg/m.   Physical Exam: General exam: Appears calm and comfortable.  Skin: No rashes, lesions or ulcers. HEENT: Atraumatic, normocephalic, supple neck, no obvious bleeding Lungs: Clear to auscultation bilaterally CVS: Regular rate and rhythm, no murmur GI/Abd soft, nontender, nondistended, bowel sound present CNS: Alert, awake, oriented x3 Psychiatry: Appropriate mood Extremities: No pedal edema, no calf tenderness  Data Review: I have personally reviewed the laboratory data and studies available. Recent Labs  Lab 01/29/19 1245  01/30/19 1624  01/31/19 0804 02/01/19 0520 02/02/19 0543 02/03/19 0539  WBC 26.2*   < > 9.5 8.7 8.7 10.0 8.7  NEUTROABS 24.1*  --   --   --   --   --   --   HGB 9.2*   < > 5.9* 7.7* 8.2* 7.9* 8.1*  HCT 30.2*   < > 18.3* 22.8* 24.4* 23.6* 24.5*  MCV 90.1   < > 83.6 82.9 84.1 84.3 84.5  PLT 127*   < > 81* 71* 70* 83* 72*   < > = values in this interval not displayed.   Recent Labs  Lab 01/29/19 1245  01/30/19 0351 01/31/19 0804 02/01/19 0520 02/02/19 0543 02/03/19 0539  NA 135   < > 140 139 141 137 136  K 6.6*   < > 3.8 3.4* 3.4* 3.1* 3.2*  CL 117*   < > 107 104 104 101 100  CO2 <7*   < > 17* 22 24 23 25   GLUCOSE 349*   < > 146* 199* 121* 114* 125*  BUN 100*   < > 93* 51* 58* 62* 18  CREATININE 11.82*   < > 9.81* 6.99* 8.87* 9.79* 4.39*  CALCIUM 9.0   < > 7.7* 7.3* 7.4* 7.5* 7.6*  MG 1.5*  --  1.1*  --  2.1 2.0 1.8  PHOS  --   --  6.1*  --  5.3* 5.0* 2.4*   < > = values in this interval not displayed.    Terrilee Croak, MD  [facility] 02/03/2019

## 2019-02-03 NOTE — Progress Notes (Signed)
Pt refused all meds except Prograf.   Eleanora Neighbor, RN

## 2019-02-03 NOTE — Progress Notes (Signed)
Labs stable.  Colonoscopic biopsies still pending, as expected.  Now on Welchol; the patient indicates that he is doing substantially better with respect to his diarrhea, he estimates about 75% better.  He has had only one bowel movement so far today, and it was semi-formed in character, by his report.    It appears that he has not had any Lomotil since yesterday evening, from review of the medication record.  Impression: Improving diarrhea of unclear etiology  Recommendation: Continue observation on current management while awaiting biopsy results.  We will follow at a distance, so please feel free to call us at any time if earlier input is desired.  Cleotis Nipper, M.D. Pager [phone] If no answer or after 5 PM call [phone]

## 2019-02-04 LAB — RENAL FUNCTION PANEL
Albumin: 3.3 g/dL — ABNORMAL LOW (ref 3.5–5.0)
Anion gap: 11 (ref 5–15)
BUN: 21 mg/dL — ABNORMAL HIGH (ref 6–20)
CO2: 23 mmol/L (ref 22–32)
Calcium: 8.2 mg/dL — ABNORMAL LOW (ref 8.9–10.3)
Chloride: 101 mmol/L (ref 98–111)
Creatinine, Ser: 6.11 mg/dL — ABNORMAL HIGH (ref 0.61–1.24)
GFR calc Af Amer: 11 mL/min — ABNORMAL LOW (ref >60)
GFR calc non Af Amer: 10 mL/min — ABNORMAL LOW (ref >60)
Glucose, Bld: 159 mg/dL — ABNORMAL HIGH (ref 70–99)
Phosphorus: 3.1 mg/dL (ref 2.5–4.6)
Potassium: 3.4 mmol/L — ABNORMAL LOW (ref 3.5–5.1)
Sodium: 135 mmol/L (ref 135–145)

## 2019-02-04 LAB — CBC
HCT: 26.7 % — ABNORMAL LOW (ref 39.0–52.0)
Hemoglobin: 8.5 g/dL — ABNORMAL LOW (ref 13.0–17.0)
MCH: 27.9 pg (ref 26.0–34.0)
MCHC: 31.8 g/dL (ref 30.0–36.0)
MCV: 87.5 fL (ref 80.0–100.0)
Platelets: 71 10*3/uL — ABNORMAL LOW (ref 150–400)
RBC: 3.05 MIL/uL — ABNORMAL LOW (ref 4.22–5.81)
RDW: 15.3 % (ref 11.5–15.5)
WBC: 10.8 10*3/uL — ABNORMAL HIGH (ref 4.0–10.5)
nRBC: 0 % (ref 0.0–0.2)

## 2019-02-04 LAB — APTT: aPTT: 30 seconds (ref 24–36)

## 2019-02-04 LAB — GLUCOSE, CAPILLARY
Glucose-Capillary: 115 mg/dL — ABNORMAL HIGH (ref 70–99)
Glucose-Capillary: 189 mg/dL — ABNORMAL HIGH (ref 70–99)
Glucose-Capillary: 137 mg/dL — ABNORMAL HIGH (ref 70–99)
Glucose-Capillary: 118 mg/dL — ABNORMAL HIGH (ref 70–99)

## 2019-02-04 LAB — MAGNESIUM: Magnesium: 1.7 mg/dL (ref 1.7–2.4)

## 2019-02-04 MED ORDER — CHLORHEXIDINE GLUCONATE CLOTH 2 % EX PADS: 6.0000 | MEDICATED_PAD | Freq: Every day | CUTANEOUS | Status: DC

## 2019-02-04 NOTE — Progress Notes (Signed)
[facility]    NEPHROLOGY PROGRESS NOTE  SUBJECTIVE: Overall feeling well.  Denies any acute complaints.  Denies chest pain, shortness of breath, nausea, vomiting, diarrhea or dysuria.  All other review of systems are negative.     OBJECTIVE:  Vitals:   02/03/19 2030 02/04/19 0907  BP: 105/70 101/64  Pulse: 83 88  Resp: 18 18  Temp: 98.9 F (37.2 C) 99 F (37.2 C)  SpO2: 96% 97%    Intake/Output Summary (Last 24 hours) at 02/04/2019 1446 Last data filed at 02/04/2019 1300 Gross per 24 hour  Intake 720 ml  Output 0 ml  Net 720 ml      General:  AAOx3 NAD HEENT: MMM [place] AT anicteric sclera Neck:  No JVD, no adenopathy CV:  Heart RRR  Lungs:  L/S CTA bilaterally Abd:  abd SNT/ND with normal BS GU:  Bladder non-palpable Extremities:  No LE edema. Skin:  No skin rash  MEDICATIONS:  . colestipol  2 g Oral BID  . dapsone  50 mg Oral Daily  . diphenoxylate-atropine  1 tablet Oral QID  . feeding supplement (NEPRO CARB STEADY)  237 mL Oral TID BM  . heparin injection (subcutaneous)  5,000 Units Subcutaneous Q8H  . insulin aspart  0-15 Units Subcutaneous TID WC  . insulin aspart  0-5 Units Subcutaneous QHS  . mouth rinse  15 mL Mouth Rinse BID  . midodrine  5 mg Oral TID WC  . multivitamin  1 tablet Oral QHS  . saccharomyces boulardii  250 mg Oral BID  . tacrolimus  3 mg Oral BID       LABS:   CBC Latest Ref Rng & Units 02/04/2019 02/03/2019 02/02/2019  WBC 4.0 - 10.5 K/uL 10.8(H) 8.7 10.0  Hemoglobin 13.0 - 17.0 g/dL 8.5(L) 8.1(L) 7.9(L)  Hematocrit 39.0 - 52.0 % 26.7(L) 24.5(L) 23.6(L)  Platelets 150 - 400 K/uL 71(L) 72(L) 83(L)    CMP Latest Ref Rng & Units 02/04/2019 02/03/2019 02/02/2019  Glucose 70 - 99 mg/dL 159(H) 125(H) 114(H)  BUN 6 - 20 mg/dL 21(H) 18 62(H)  Creatinine 0.61 - 1.24 mg/dL 6.11(H) 4.39(H) 9.79(H)  Sodium 135 - 145 mmol/L 135 136 137  Potassium 3.5 - 5.1 mmol/L 3.4(L) 3.2(L) 3.1(L)  Chloride 98 - 111 mmol/L 101 100 101  CO2  22 - 32 mmol/L 23 25 23   Calcium 8.9 - 10.3 mg/dL 8.2(L) 7.6(L) 7.5(L)  Total Protein 6.5 - 8.1 g/dL - - -  Total Bilirubin 0.3 - 1.2 mg/dL - - -  Alkaline Phos 38 - 126 U/L - - -  AST 15 - 41 U/L - - -  ALT 0 - 44 U/L - - -    Lab Results  Component Value Date   PTH 17 01/29/2019   CALCIUM 8.2 (L) 02/04/2019   CAION 1.32 01/29/2019   PHOS 3.1 02/04/2019       Component Value Date/Time   COLORURINE YELLOW 12/25/2018 0200   APPEARANCEUR CLOUDY (A) 12/25/2018 0200   LABSPEC 1.012 12/25/2018 0200   PHURINE 5.0 12/25/2018 0200   GLUCOSEU NEGATIVE 12/25/2018 0200   HGBUR MODERATE (A) 12/25/2018 0200   BILIRUBINUR NEGATIVE 12/25/2018 0200   KETONESUR NEGATIVE 12/25/2018 0200   PROTEINUR 30 (A) 12/25/2018 0200   UROBILINOGEN 0.2 12/23/2008 1845   NITRITE NEGATIVE 12/25/2018 0200   LEUKOCYTESUR NEGATIVE 12/25/2018 0200      Component Value Date/Time   PHART 7.305 (L) 01/30/2019 0345   PCO2ART  Below reportable range 01/30/2019 0345  PO2ART 156 (H) 01/30/2019 0345   HCO3 6.3 (L) 01/30/2019 0345   TCO2 <5 (L) 01/29/2019 1405   ACIDBASEDEF 19.4 (H) 01/30/2019 0345   O2SAT 98.5% 01/30/2019 0345       Component Value Date/Time   IRON 187 (H) 01/29/2019 1624   IRON 91 08/26/2016   TIBC 237 (L) 01/29/2019 1624   TIBC 183 08/26/2016   FERRITIN 701 (H) 12/26/2018 0046   FERRITIN 3,610 08/26/2016   IRONPCTSAT 79 (H) 01/29/2019 1624       ASSESSMENT/PLAN:    48 year old male patient with a past medical history significant for end-stage renal disease status post second renal transplantation in 2015, type 2 diabetes, anemia of chronic kidney disease, and hyperlipidemia who presented with diarrhea, leukocytosis, and pancolitis.  He was found to have acute kidney injury as well.  1.  End-stage renal disease.  Status post dialysis yesterday.  Will plan dialysis on a Monday, Wednesday, Friday schedule.  We will continue to wean immunosuppressants.  Is off of Myfortic.  We will need to  set up for outpatient dialysis.  Has a left upper extremity AV graft.  2.  Anemia.  Likely secondary to chronic kidney disease.  Continue Aranesp and iron supplementation as needed.  3.  Metabolic bone disease.  PTH is low.  We will hold off on treating phosphorus for now.  3.  Pancolitis.  Appears to be resolving.  Biopsies are pending.     [place], DO, [place]

## 2019-02-04 NOTE — Progress Notes (Signed)
Patient reports continued improvement in diarrhea, no bowel movements since yesterday afternoon, on colestipol.  Colonoscopic biopsies pending, hopefully ready tomorrow.  Cleotis Nipper, M.D. Pager [phone] If no answer or after 5 PM call [phone]

## 2019-02-04 NOTE — Progress Notes (Signed)
PROGRESS NOTE  Bartley Vuolo DEY:[id number] DOB: 04/16/1971 DOA: 01/29/2019 PCP: [name], NP   LOS: 6 days   Brief narrative: Patient is a 48 year old male with history of ESRD status post second renal transplant in 2015, DM-2, anemia of chronic disease, hyperlipidemiaandnoncompliance admitted to ICU with diarrhea, leukocytosis, acute on chronic renal failure with profound anion gap metabolic acidosis and hyperkalemia on 01/29/2019. This is a second presentation with dehydration/acidosis and ongoing diarrhea.  In ED,WBC 26.2. Potassium 6.6. Serum creatinine 11.82 (baseline about 4). CO2 less than 7. Anion gap too high to calculate with a glucose of 349. VBG 6.9/19.7/71/<5.COVID-19 negative. Patient was started on vancomycin and Zosyn and admitted to ICU. Nephrology and GI consulted.  Patient was continued on Zosyn from 6/15 to 6/17 which was later discontinued after negative cultures. GI consulted for his ongoing diarrhea. C. difficile and GI panel negative. Nephrology consulted for ESRD.  Subjective: Patient was seen and examined this morning.  Patient middle-aged African-American male.  Not in distress.  Diarrhea stopped.  Last pulmonary yesterday.  Assessment/Plan:  Active Problems:   Acute on chronic renal failure (HCC)   Pressure injury of skin   Protein-calorie malnutrition, severe  ESRD s/p second transplant in 2015-no signs of rejection on biopsy in May 2020.On tacrolimus, Myfortic and dapsone at home. -Presented with AKI secondary to diarrhea.  AKI progressed to ESRD again.  Restarted on dialysis.  Being weaned off immunosuppressants gradually. -Nephrology following.  Gastroenteritis/diarrhea -Presented with diarrhea for about 1.5 months prior to presentation. Unclear etiology of this. CT abdomen on admission showed sigmoid colitis.Lipase elevated to 244 on admission. -Colonoscopy on 6/18 showed erythematous, inflamed and thickened fold  mucosa throughout the colon and rectum. -GI recommendedto low fiber diet, and colestipol 1 gm 2 pills BID. -Patient was also started on WelChol.  Diarrhea stopped.  Last bowel movement yesterday.    DKA/IDDM-2 with ESRD and retinopathy: DKA likely due to diarrhea/dehydration. DKA resolved. -Continue CBG monitoring and sliding scale insulin.  Anion gap metabolic acidosis: Combination of daily and ESRD.Resolved.  Leukocytosis: Resolved.  Diastolic CHF: Echo in 3149 with EF of 55 to 60% but no other significant finding.Not on diuretics at home. Euvolemic. -Fluid management by dialysis  Hypotension: Resolved. -Home Coreg on hold. -Midodrine  Anemia of chronic disease: Hemoglobin 10.9 on admission and dropped to 5.9 which is likely dilutionalafter initial hemoconcentration. Received 1 unit with appropriate response. H&H stable now. -Nephrology on board  Thrombocytopenia:Stable after initial drop likely from dilution. -Continue monitoring  Moderate protein calorie malnutrition: Likely due to acute on chronic illness. BMI 18.9. -Consult dietitian  Mobility:Encourage ambulation Diet:Soft diet DVT prophylaxis:Subcu heparin Code Status:Code Status: Full Code Family Communication: Expected Discharge:Remains inpatient.  Consultants:  GI  Nephrology  Procedures:  HD  Microbiology:  Blood cultures negative  C. difficile and GI panel negative  COVID-19 negative x2.  Antimicrobials:  Anti-infectives (From admission, onward)   Start     Dose/Rate Route Frequency Ordered Stop   01/30/19 1000  dapsone tablet 50 mg     50 mg Oral Daily 01/29/19 1544     01/29/19 1615  piperacillin-tazobactam (ZOSYN) IVPB 2.25 g  Status:  Discontinued     2.25 g 100 mL/hr over 30 Minutes Intravenous Every 6 hours 01/29/19 1603 01/31/19 0816   01/29/19 1415  vancomycin (VANCOCIN) 1,250 mg in sodium chloride 0.9 % 250 mL IVPB  Status:  Discontinued     1,250  mg 166.7 mL/hr over 90 Minutes Intravenous  Once 01/29/19  1414 01/29/19 1544   01/29/19 1415  piperacillin-tazobactam (ZOSYN) IVPB 3.375 g  Status:  Discontinued     3.375 g 100 mL/hr over 30 Minutes Intravenous  Once 01/29/19 1414 01/29/19 1544      Infusions:    Scheduled Meds: . colestipol  2 g Oral BID  . dapsone  50 mg Oral Daily  . diphenoxylate-atropine  1 tablet Oral QID  . feeding supplement (NEPRO CARB STEADY)  237 mL Oral TID BM  . heparin injection (subcutaneous)  5,000 Units Subcutaneous Q8H  . insulin aspart  0-15 Units Subcutaneous TID WC  . insulin aspart  0-5 Units Subcutaneous QHS  . mouth rinse  15 mL Mouth Rinse BID  . midodrine  5 mg Oral TID WC  . multivitamin  1 tablet Oral QHS  . saccharomyces boulardii  250 mg Oral BID  . tacrolimus  3 mg Oral BID    PRN meds: ondansetron (ZOFRAN) IV   Objective: Vitals:   02/03/19 2030 02/04/19 0907  BP: 105/70 101/64  Pulse: 83 88  Resp: 18 18  Temp: 98.9 F (37.2 C) 99 F (37.2 C)  SpO2: 96% 97%    Intake/Output Summary (Last 24 hours) at 02/04/2019 1423 Last data filed at 02/04/2019 0903 Gross per 24 hour  Intake 420 ml  Output 0 ml  Net 420 ml   Filed Weights   02/02/19 2225 02/03/19 0232 02/04/19 0500  Weight: 51 kg 50.7 kg 50.7 kg   Weight change: -0.8 kg Body mass index is 18.6 kg/m.   Physical Exam: General exam: Appears calm and comfortable.  Skin: No rashes, lesions or ulcers. HEENT: Atraumatic, normocephalic, supple neck, no obvious bleeding Lungs: Clear to auscultation bilaterally CVS: Regular rate and rhythm, no murmur GI/Abd soft, nontender, nondistended, bowel sound present CNS: Alert, awake, oriented x3 Psychiatry: Mood appropriate Extremities: No pedal edema, no calf tenderness  Data Review: I have personally reviewed the laboratory data and studies available.  Recent Labs  Lab 01/29/19 1245  01/31/19 0804 02/01/19 0520 02/02/19 0543 02/03/19 0539 02/04/19 0808  WBC  26.2*   < > 8.7 8.7 10.0 8.7 10.8*  NEUTROABS 24.1*  --   --   --   --   --   --   HGB 9.2*   < > 7.7* 8.2* 7.9* 8.1* 8.5*  HCT 30.2*   < > 22.8* 24.4* 23.6* 24.5* 26.7*  MCV 90.1   < > 82.9 84.1 84.3 84.5 87.5  PLT 127*   < > 71* 70* 83* 72* 71*   < > = values in this interval not displayed.   Recent Labs  Lab 01/30/19 0351 01/31/19 0804 02/01/19 0520 02/02/19 0543 02/03/19 0539 02/04/19 0808  NA 140 139 141 137 136 135  K 3.8 3.4* 3.4* 3.1* 3.2* 3.4*  CL 107 104 104 101 100 101  CO2 17* 22 24 23 25 23   GLUCOSE 146* 199* 121* 114* 125* 159*  BUN 93* 51* 58* 62* 18 21*  CREATININE 9.81* 6.99* 8.87* 9.79* 4.39* 6.11*  CALCIUM 7.7* 7.3* 7.4* 7.5* 7.6* 8.2*  MG 1.1*  --  2.1 2.0 1.8 1.7  PHOS 6.1*  --  5.3* 5.0* 2.4* 3.1    Terrilee Croak, MD  [facility] 02/04/2019

## 2019-02-05 LAB — RENAL FUNCTION PANEL
Albumin: 3.4 g/dL — ABNORMAL LOW (ref 3.5–5.0)
Anion gap: 10 (ref 5–15)
BUN: 22 mg/dL — ABNORMAL HIGH (ref 6–20)
CO2: 25 mmol/L (ref 22–32)
Calcium: 8.7 mg/dL — ABNORMAL LOW (ref 8.9–10.3)
Chloride: 99 mmol/L (ref 98–111)
Creatinine, Ser: 6.34 mg/dL — ABNORMAL HIGH (ref 0.61–1.24)
GFR calc Af Amer: 11 mL/min — ABNORMAL LOW (ref >60)
GFR calc non Af Amer: 9 mL/min — ABNORMAL LOW (ref >60)
Glucose, Bld: 114 mg/dL — ABNORMAL HIGH (ref 70–99)
Phosphorus: 4.1 mg/dL (ref 2.5–4.6)
Potassium: 3.7 mmol/L (ref 3.5–5.1)
Sodium: 134 mmol/L — ABNORMAL LOW (ref 135–145)

## 2019-02-05 LAB — GLUCOSE, CAPILLARY
Glucose-Capillary: 110 mg/dL — ABNORMAL HIGH (ref 70–99)
Glucose-Capillary: 138 mg/dL — ABNORMAL HIGH (ref 70–99)
Glucose-Capillary: 126 mg/dL — ABNORMAL HIGH (ref 70–99)

## 2019-02-05 LAB — MAGNESIUM: Magnesium: 1.7 mg/dL (ref 1.7–2.4)

## 2019-02-05 LAB — APTT: aPTT: 34 seconds (ref 24–36)

## 2019-02-05 MED ORDER — DIPHENOXYLATE-ATROPINE 2.5-0.025 MG PO TABS: 1.0000 | ORAL_TABLET | Freq: Two times a day (BID) | ORAL | Status: DC | PRN

## 2019-02-05 MED ORDER — RENA-VITE PO TABS: 1.0000 | ORAL_TABLET | Freq: Every day | ORAL | 0 refills | Status: AC

## 2019-02-05 MED ORDER — TACROLIMUS 1 MG PO CAPS: 3.0000 mg | ORAL_CAPSULE | Freq: Two times a day (BID) | ORAL | Status: DC

## 2019-02-05 MED ORDER — MIDODRINE HCL 5 MG PO TABS: 5.0000 mg | ORAL_TABLET | Freq: Three times a day (TID) | ORAL | 0 refills | Status: AC

## 2019-02-05 MED ORDER — DIPHENOXYLATE-ATROPINE 2.5-0.025 MG PO TABS: 1.0000 | ORAL_TABLET | Freq: Two times a day (BID) | ORAL | Status: DC

## 2019-02-05 MED ORDER — DIPHENOXYLATE-ATROPINE 2.5-0.025 MG PO TABS: 1.0000 | ORAL_TABLET | Freq: Two times a day (BID) | ORAL | 0 refills | Status: AC | PRN

## 2019-02-05 MED ORDER — COLESTIPOL HCL 1 G PO TABS: 2.0000 g | ORAL_TABLET | Freq: Two times a day (BID) | ORAL | 0 refills | Status: DC

## 2019-02-05 MED ORDER — INSULIN ASPART 100 UNIT/ML ~~LOC~~ SOLN: 0.0000 [IU] | Freq: Three times a day (TID) | SUBCUTANEOUS | 11 refills | Status: DC

## 2019-02-05 NOTE — Progress Notes (Signed)
[name] to be discharged Home per MD order. Discussed prescriptions and follow up appointments with the patient. Prescriptions explained to patient; medication list explained in detail. Patient verbalized understanding.  Skin clean, dry and intact without evidence of skin break down, no evidence of skin tears noted. IV catheter discontinued intact. Site without signs and symptoms of complications. Dressing and pressure applied. Pt denies pain at the site currently. No complaints noted.  Patient free of lines, drains, and wounds.   An After Visit Summary (AVS) was printed and given to the patient. Patient escorted via wheelchair, and discharged home via private auto.  Amaryllis Dyke, RN

## 2019-02-05 NOTE — Progress Notes (Signed)
Renal Navigator received call from Attending/Dr. Pietro Cassis who states patient is ready for discharge today and asked if he is set up for OP HD treatment. Renal Navigator sent in referral less than an hour ago, but called SW Clinic Manager/A. Hunt who states he can start tomorrow on a TTS schedule with a seat time of 11:45am. Patient needs to arrive at 11:00am tomorrow to complete paperwork before his first treatment.  Renal Navigator notified Nephrologist/Dr. Grayland Ormond, who states patient is cleared for discharge today from renal standpoint, Attending/Dr. Pietro Cassis, and patient with information on his schedule. Renal Navigator spoke with Renal PA/M. Bergman to request that HD orders be sent to clinic. Clinic is aware that patient will start OP HD treatment tomorrow, 02/06/19.  [facility] [place], [place] [address] [phone]  Alphonzo Cruise, [place] Renal Navigator [phone]

## 2019-02-05 NOTE — Progress Notes (Signed)
Renal Navigator received notification from Dr. Finnegan/Nephrologist to initiate OP HD referral for patient. Renal Navigator completed assessment with patient and submitted referral to [facility] Admissions Coordinator for treatment at [facility] clinic as patient states he has been a patient at this clinic in the past and would like to return to this clinic for treatment now.  Renal Navigator will follow up with Nephrologist and patient once seat schedule has been obtained.  [name], [place] Renal Navigator [phone]

## 2019-02-05 NOTE — Progress Notes (Signed)
[facility]    NEPHROLOGY PROGRESS NOTE  SUBJECTIVE: Overall feeling well.  Denies any acute complaints.  Denies chest pain, shortness of breath, nausea, vomiting, diarrhea or dysuria.  All other review of systems are negative.     OBJECTIVE:  Vitals:   02/05/19 0539 02/05/19 0944  BP: 111/74 106/75  Pulse: 98 98  Resp: 18 18  Temp: 99.7 F (37.6 C) 99.1 F (37.3 C)  SpO2: 99% 95%    Intake/Output Summary (Last 24 hours) at 02/05/2019 1543 Last data filed at 02/05/2019 0900 Gross per 24 hour  Intake 360 ml  Output 0 ml  Net 360 ml      General:  AAOx3 NAD HEENT: MMM [place] AT anicteric sclera Neck:  No JVD, no adenopathy CV:  Heart RRR  Lungs:  L/S CTA bilaterally Abd:  abd SNT/ND with normal BS GU:  Bladder non-palpable Extremities:  No LE edema.  Left forearm loop graft with good thrill and bruit. Skin:  No skin rash  MEDICATIONS:  . Chlorhexidine Gluconate Cloth  6 each Topical Q0600  . colestipol  2 g Oral BID  . dapsone  50 mg Oral Daily  . feeding supplement (NEPRO CARB STEADY)  237 mL Oral TID BM  . heparin injection (subcutaneous)  5,000 Units Subcutaneous Q8H  . insulin aspart  0-15 Units Subcutaneous TID WC  . insulin aspart  0-5 Units Subcutaneous QHS  . mouth rinse  15 mL Mouth Rinse BID  . midodrine  5 mg Oral TID WC  . multivitamin  1 tablet Oral QHS  . tacrolimus  3 mg Oral BID       LABS:   CBC Latest Ref Rng & Units 02/04/2019 02/03/2019 02/02/2019  WBC 4.0 - 10.5 K/uL 10.8(H) 8.7 10.0  Hemoglobin 13.0 - 17.0 g/dL 8.5(L) 8.1(L) 7.9(L)  Hematocrit 39.0 - 52.0 % 26.7(L) 24.5(L) 23.6(L)  Platelets 150 - 400 K/uL 71(L) 72(L) 83(L)    CMP Latest Ref Rng & Units 02/05/2019 02/04/2019 02/03/2019  Glucose 70 - 99 mg/dL 114(H) 159(H) 125(H)  BUN 6 - 20 mg/dL 22(H) 21(H) 18  Creatinine 0.61 - 1.24 mg/dL 6.34(H) 6.11(H) 4.39(H)  Sodium 135 - 145 mmol/L 134(L) 135 136  Potassium 3.5 - 5.1 mmol/L 3.7 3.4(L) 3.2(L)  Chloride 98 - 111 mmol/L  99 101 100  CO2 22 - 32 mmol/L 25 23 25   Calcium 8.9 - 10.3 mg/dL 8.7(L) 8.2(L) 7.6(L)  Total Protein 6.5 - 8.1 g/dL - - -  Total Bilirubin 0.3 - 1.2 mg/dL - - -  Alkaline Phos 38 - 126 U/L - - -  AST 15 - 41 U/L - - -  ALT 0 - 44 U/L - - -    Lab Results  Component Value Date   PTH 17 01/29/2019   CALCIUM 8.7 (L) 02/05/2019   CAION 1.32 01/29/2019   PHOS 4.1 02/05/2019       Component Value Date/Time   COLORURINE YELLOW 12/25/2018 0200   APPEARANCEUR CLOUDY (A) 12/25/2018 0200   LABSPEC 1.012 12/25/2018 0200   PHURINE 5.0 12/25/2018 0200   GLUCOSEU NEGATIVE 12/25/2018 0200   HGBUR MODERATE (A) 12/25/2018 0200   BILIRUBINUR NEGATIVE 12/25/2018 0200   KETONESUR NEGATIVE 12/25/2018 0200   PROTEINUR 30 (A) 12/25/2018 0200   UROBILINOGEN 0.2 12/23/2008 1845   NITRITE NEGATIVE 12/25/2018 0200   LEUKOCYTESUR NEGATIVE 12/25/2018 0200      Component Value Date/Time   PHART 7.305 (L) 01/30/2019 0345   PCO2ART  Below reportable  range 01/30/2019 0345   PO2ART 156 (H) 01/30/2019 0345   HCO3 6.3 (L) 01/30/2019 0345   TCO2 <5 (L) 01/29/2019 1405   ACIDBASEDEF 19.4 (H) 01/30/2019 0345   O2SAT 98.5% 01/30/2019 0345       Component Value Date/Time   IRON 187 (H) 01/29/2019 1624   IRON 91 08/26/2016   TIBC 237 (L) 01/29/2019 1624   TIBC 183 08/26/2016   FERRITIN 701 (H) 12/26/2018 0046   FERRITIN 3,610 08/26/2016   IRONPCTSAT 79 (H) 01/29/2019 1624       ASSESSMENT/PLAN:    48 year old male patient with a past medical history significant for end-stage renal disease status post second renal transplantation in 2015, type 2 diabetes, anemia of chronic kidney disease, and hyperlipidemia who presented with diarrhea, leukocytosis, and pancolitis.  He was found to have acute kidney injury as well.  1.  End-stage renal disease.  Status post dialysis yesterday.   We will continue to wean immunosuppressants.  Is off of Myfortic.  Has an outpatient seat for Tuesday, Thursday, Saturday.   Will plan for dialysis tomorrow.  Has a left upper extremity AV graft.  2.  Anemia.  Likely secondary to chronic kidney disease.  Continue Aranesp and iron supplementation as needed.  3.  Metabolic bone disease.  PTH is low.  We will hold off on treating phosphorus for now.  3.  Pancolitis.  Appears to be resolving.  GI following.     [place], DO, [place]

## 2019-02-05 NOTE — Care Management Important Message (Signed)
Important Message  Patient Details  Name: [name] MRN: [id number] Date of Birth: 1971-05-03   Medicare Important Message Given:  Yes     Aarush Stukey Mont[address] Circle 02/05/2019, 2:33 PM

## 2019-02-05 NOTE — Discharge Summary (Signed)
Physician Discharge Summary  [name] EVO:[id number] DOB: 12/15/1970 DOA: 01/29/2019  PCP: [name], NP  Admit date: 01/29/2019 Discharge date: 02/05/2019  Admitted From: Home Discharge disposition: Home   Code Status: Full Code   Recommendations for Outpatient Follow-Up:   1. Follow-up with nephrology as an outpatient  Discharge Diagnosis:   Active Problems:   Acute on chronic renal failure (HCC)   Pressure injury of skin   Protein-calorie malnutrition, severe   History of Present Illness / Brief narrative:  Patient is a14 year old male with history of ESRD status post second renal transplant in 2015, DM-2, anemia of chronic disease, hyperlipidemiaandnoncompliance admitted to ICU with diarrhea, leukocytosis, acute on chronic renal failure with profound anion gap metabolic acidosis and hyperkalemia on 01/29/2019. This is a second presentation with dehydration/acidosis and ongoing diarrhea.  In ED,WBC 26.2. Potassium 6.6. Serum creatinine 11.82 (baseline about 4). CO2 less than 7. Anion gap too high to calculate with a glucose of 349. VBG 6.9/19.7/71/<5.COVID-19 negative. Patient was started on vancomycin and Zosyn and admitted to ICU. Nephrology and GI consulted.  Patient was continued on Zosyn from 6/15 to 6/17 which was later discontinued after negative cultures. GI consulted for his ongoing diarrhea. C. difficile and GI panel negative. Nephrology consulted for ESRD.  Hospital Course:  ESRDs/psecond transplant in 2015 -no signs of rejection on biopsy in May 2020. -Presented with AKI secondary to diarrhea. AKI progressed to ESRD again. Restarted on dialysis, underwent for status in 6/20.  He was planned for dialysis today.  However, he is also scheduled for initiation of TTS dialysis from tomorrow as an outpatient.  Per nephrology, patient is not symptomatic today, can skip inpatient dialysis today.  He will started from tomorrow as an  outpatient. -Prior to admission, patient was on tacrolimus, Myfortic and dapsone at home. -Being weaned off immunosuppressants gradually.  Per nephrology recommendation, Myfortic was stopped.  Tacrolimus dose reduced, dapsone to continue. -Follow-up with nephrology as an outpatient.  Gastroenteritis/diarrhea -Presented with diarrheafor about 1.5 months prior to presentation. Unclear etiology of this. CT abdomen on admission showed sigmoid colitis.Lipase elevated to 244 on admission. -Colonoscopy on 6/18 showed erythematous, inflamed and thickened fold mucosa throughout the colon and rectum. - GI recommendedto low fiber diet, and colestipol 2 g BID.  Lomotil twice daily as needed. - Diarrhea has stopped.  Last bowel movement yesterday.    DKA/IDDM-2 with ESRD and retinopathy:  -DKA likely due to diarrhea/dehydration. DKA resolved. -Prior to admission, patient was on Tresiba 5 units at bedtime and NovoLog 12 units 3 times daily -Currently on sliding scale insulin only.  Blood sugar has remained controlled.  Continue sliding scale insulin only at home.  Resume previous regimen if blood pressure trends up.  Diastolic CHF: Echo in 2993 with EF of 55 to 60% but no other significant finding.Not on diuretics at home. Euvolemic. Was on Coreg at home.  It was held because of hypotension.  Midodrine was started.  Blood pressures currently stable on that.  Discharged on [place]..  Anemia of chronic disease: Hemoglobin 10.9 on admission and dropped to 5.9 which is likely dilutionalafter initial hemoconcentration. Received 1 unit with appropriate response. H&H stable now. -Nephrology on board  Moderate protein calorie malnutrition: Likely due to acute on chronic illness. BMI 18.9.  Nutrition consult appreciated.  Stable for discharge to home today.  Subjective:  Seen and examined this morning.  Pleasant middle-aged African-American male.  Not in distress.  No new  symptoms.  Discharge Exam:  Vitals:   02/04/19 2143 02/05/19 0500 02/05/19 0539 02/05/19 0944  BP: 101/74  111/74 106/75  Pulse: 93  98 98  Resp: 18  18 18   Temp: 99 F (37.2 C)  99.7 F (37.6 C) 99.1 F (37.3 C)  TempSrc: Oral  Oral Oral  SpO2: 98%  99% 95%  Weight:  50.7 kg    Height:        Body mass index is 18.6 kg/m.  General exam: Appears calm and comfortable.  Lying in bed.  Not in distress Skin: No rashes, lesions or ulcers. HEENT: Atraumatic, normocephalic, supple neck, no obvious bleeding Lungs: Clear to auscultation bilaterally CVS: Regular rate and rhythm, no murmur GI/Abd soft, nontender, nondistended, bowel sound present CNS: Alert, awake, oriented x3 Psychiatry: Mood appropriate Extremities: No pedal edema, no calf tenderness  Discharge Instructions:  Wound care: None Discharge Instructions    Diet - low sodium heart healthy   Complete by: As directed    Increase activity slowly   Complete by: As directed       Allergies as of 02/05/2019      Reactions   Lactose Intolerance (gi) Diarrhea   No whole milk!!   Other Other (See Comments)   Soy milk burns his tongue   Pollen Extract Other (See Comments)   Itching, watery eyes and sneezing.   Tape Rash   PLEASE USE COBAN WRAP Pt can't have paper tape      Medication List    STOP taking these medications   carvedilol 6.25 MG tablet Commonly known as: COREG   Dexcom G6 Sensor Misc   Dexcom G6 Transmitter Misc   Dexcom Receiver Kit Devi   Insulin Pen Needle 31G X 5 MM Misc   mycophenolate 180 MG EC tablet Commonly known as: MYFORTIC   NovoLOG FlexPen 100 UNIT/ML FlexPen Generic drug: insulin aspart Replaced by: insulin aspart 100 UNIT/ML injection   Tresiba FlexTouch 100 UNIT/ML Sopn FlexTouch Pen Generic drug: insulin degludec     TAKE these medications   aspirin EC 81 MG tablet Take 81 mg by mouth daily.   atorvastatin 20 MG tablet Commonly known as: LIPITOR Take one tablet  by mouth at bedtime for cholesterol. NEEDS AN APPOINTMENT BEFORE ANYMORE FUTURE REFILLS.   colestipol 1 g tablet Commonly known as: COLESTID Take 2 tablets (2 g total) by mouth 2 (two) times daily for 7 days.   dapsone 25 MG tablet Take 50 mg by mouth daily.   diphenoxylate-atropine 2.5-0.025 MG tablet Commonly known as: LOMOTIL Take 1 tablet by mouth 2 (two) times daily as needed for up to 7 days for diarrhea or loose stools.   folic acid 1 MG tablet Commonly known as: FOLVITE Take 1 mg by mouth daily.   insulin aspart 100 UNIT/ML injection Commonly known as: novoLOG Inject 0-15 Units into the skin 3 (three) times daily with meals. Replaces: NovoLOG FlexPen 100 UNIT/ML FlexPen   midodrine 5 MG tablet Commonly known as: PROAMATINE Take 1 tablet (5 mg total) by mouth 3 (three) times daily with meals for 30 days.   multivitamin Tabs tablet Take 1 tablet by mouth at bedtime for 30 days.   omeprazole 20 MG capsule Commonly known as: PRILOSEC Take 1 capsule (20 mg total) by mouth daily.   tacrolimus 1 MG capsule Commonly known as: PROGRAF Take 3 capsules (3 mg total) by mouth 2 (two) times daily. What changed:   how much to take  when to take this  additional instructions  Time coordinating discharge: 35 minutes  The results of significant diagnostics from this hospitalization (including imaging, microbiology, ancillary and laboratory) are listed below for reference.    Procedures and Diagnostic Studies:   Ct Abdomen Pelvis Wo Contrast  Result Date: 01/29/2019 CLINICAL DATA:  Shortness of breath and diarrhea. Status post kidney transplant 6 years ago. EXAM: CT ABDOMEN AND PELVIS WITHOUT CONTRAST TECHNIQUE: Multidetector CT imaging of the abdomen and pelvis was performed following the standard protocol without IV contrast. COMPARISON:  12/25/2018 FINDINGS: Lower chest: Examination is severely limited by motion artifact. Given this limitation no definite  abnormality was detected involving the lower lobes. Hepatobiliary: The liver is unremarkable. The gallbladder is significantly distended. There appears to be some gallbladder sludge. Pancreas: No significant abnormality, however evaluation is limited by motion artifact. Spleen: Normal in size without focal abnormality. Adrenals/Urinary Tract: The native kidneys are atrophic with multiple small calcifications. The adrenal glands are not well evaluated secondary to significant motion artifact. There is a transplant kidney in the left pelvis without evidence of hydronephrosis. The bladder is unremarkable. Stomach/Bowel: There is some mild diffuse wall thickening of the sigmoid colon. There is likely liquid stool throughout the colon. The appendix appears unremarkable but is suboptimally evaluated secondary to extensive motion artifact. No evidence of a small-bowel obstruction. The stomach is unremarkable. Vascular/Lymphatic: Aortic atherosclerosis. No enlarged abdominal or pelvic lymph nodes. Reproductive: Prostate is unremarkable. Other: No abdominal wall hernia or abnormality. No abdominopelvic ascites. Musculoskeletal: No fracture is seen. IMPRESSION: 1. Very limited study secondary to extensive motion artifact. 2. Wall thickening of the sigmoid colon with liquid stool throughout the colon. Findings may be secondary to an infectious or inflammatory colitis. 3. Transplant kidney in the left pelvis without evidence of hydronephrosis. 4. Very distended gallbladder, not significantly changed from 12/25/2018. If there is clinical suspicion for acute cholecystitis, follow-up with ultrasound is recommended. There is likely gallbladder sludge. Electronically Signed   By: Constance Holster M.D.   On: 01/29/2019 17:01   Dg Chest Port 1 View  Result Date: 01/30/2019 CLINICAL DATA:  Dyspnea. EXAM: PORTABLE CHEST 1 VIEW COMPARISON:  Single-view of the chest 01/29/2019. PA and lateral chest 01/18/2018. FINDINGS: Lungs clear.  Heart size normal. No pneumothorax or pleural fluid. No bony abnormality. IMPRESSION: Normal chest. Electronically Signed   By: Inge Rise M.D.   On: 01/30/2019 07:59   Dg Chest Port 1 View  Result Date: 01/29/2019 CLINICAL DATA:  Shortness of breath for 1 day. EXAM: PORTABLE CHEST 1 VIEW COMPARISON:  None. FINDINGS: Cardiomediastinal silhouette is normal. Mediastinal contours appear intact. There is no evidence of focal airspace consolidation, pleural effusion or pneumothorax. Osseous structures are without acute abnormality. Soft tissues are grossly normal. IMPRESSION: No active disease. Electronically Signed   By: Fidela Salisbury M.D.   On: 01/29/2019 13:43     Labs:   Basic Metabolic Panel: Recent Labs  Lab 02/01/19 0520 02/02/19 0543 02/03/19 0539 02/04/19 0808 02/05/19 0531  NA 141 137 136 135 134*  K 3.4* 3.1* 3.2* 3.4* 3.7  CL 104 101 100 101 99  CO2 24 23 25 23 25   GLUCOSE 121* 114* 125* 159* 114*  BUN 58* 62* 18 21* 22*  CREATININE 8.87* 9.79* 4.39* 6.11* 6.34*  CALCIUM 7.4* 7.5* 7.6* 8.2* 8.7*  MG 2.1 2.0 1.8 1.7 1.7  PHOS 5.3* 5.0* 2.4* 3.1 4.1   GFR Estimated Creatinine Clearance: 10 mL/min (A) (by C-G formula based on SCr of 6.34 mg/dL (H)). Liver Function Tests: Recent  Labs  Lab 02/01/19 0520 02/02/19 0543 02/03/19 0539 02/04/19 0808 02/05/19 0531  ALBUMIN 3.3* 3.3* 3.3* 3.3* 3.4*   Recent Labs  Lab 01/29/19 1626 02/01/19 0520  LIPASE 244* 71*   No results for input(s): AMMONIA in the last 168 hours. Coagulation profile No results for input(s): INR, PROTIME in the last 168 hours.  CBC: Recent Labs  Lab 01/31/19 0804 02/01/19 0520 02/02/19 0543 02/03/19 0539 02/04/19 0808  WBC 8.7 8.7 10.0 8.7 10.8*  HGB 7.7* 8.2* 7.9* 8.1* 8.5*  HCT 22.8* 24.4* 23.6* 24.5* 26.7*  MCV 82.9 84.1 84.3 84.5 87.5  PLT 71* 70* 83* 72* 71*   Cardiac Enzymes: No results for input(s): CKTOTAL, CKMB, CKMBINDEX, TROPONINI in the last 168  hours. BNP: Invalid input(s): POCBNP CBG: Recent Labs  Lab 02/04/19 1123 02/04/19 1609 02/04/19 2143 02/05/19 0717 02/05/19 1145  GLUCAP 189* 137* 118* 110* 138*   D-Dimer No results for input(s): DDIMER in the last 72 hours. Hgb A1c No results for input(s): HGBA1C in the last 72 hours. Lipid Profile No results for input(s): CHOL, HDL, LDLCALC, TRIG, CHOLHDL, LDLDIRECT in the last 72 hours. Thyroid function studies No results for input(s): TSH, T4TOTAL, T3FREE, THYROIDAB in the last 72 hours.  Invalid input(s): FREET3 Anemia work up No results for input(s): VITAMINB12, FOLATE, FERRITIN, TIBC, IRON, RETICCTPCT in the last 72 hours. Microbiology Recent Results (from the past 240 hour(s))  SARS Coronavirus 2     Status: None   Collection Time: 01/29/19  3:30 PM  Result Value Ref Range Status   SARS Coronavirus 2 NOT DETECTED NOT DETECTED Final    Comment: (NOTE) SARS-CoV-2 target nucleic acids are NOT DETECTED. The SARS-CoV-2 RNA is generally detectable in upper and lower respiratory specimens during the acute phase of infection.  Negative  results do not preclude SARS-CoV-2 infection, do not rule out co-infections with other pathogens, and should not be used as the sole basis for treatment or other patient management decisions.  Negative results must be combined with clinical observations, patient history, and epidemiological information. The expected result is Not Detected. Fact Sheet for Patients: http://www.biofiredefense.com/wp-content/uploads/2020/03/BIOFIRE-COVID -19-patients.pdf Fact Sheet for Healthcare Providers: http://www.biofiredefense.com/wp-content/uploads/2020/03/BIOFIRE-COVID -19-hcp.pdf This test is not yet approved or cleared by the [place] and  has been authorized for detection and/or diagnosis of SARS-CoV-2 by FDA under an Emergency Use Authorization (EUA).  This EUA will remain in effec t (meaning this test can be used) for the duration  of  the COVID-19 declaration under Section 564(b)(1) of the Act, 21 U.S.C. section 360bbb-3(b)(1), unless the authorization is terminated or revoked sooner. Performed at [facility], [place] [address] [place], [place] [address]   Blood culture (routine x 2)     Status: None   Collection Time: 01/29/19  4:26 PM   Specimen: BLOOD  Result Value Ref Range Status   Specimen Description BLOOD RIGHT ANTECUBITAL  Final   Special Requests   Final    BOTTLES DRAWN AEROBIC AND ANAEROBIC Blood Culture adequate volume   Culture   Final    NO GROWTH 5 DAYS Performed at [facility], [place] [address] [place], [place] [address]    Report Status 02/03/2019 FINAL  Final  Blood culture (routine x 2)     Status: None   Collection Time: 01/29/19  6:55 PM   Specimen: BLOOD  Result Value Ref Range Status   Specimen Description BLOOD RIGHT ANTECUBITAL  Final   Special Requests   Final  BOTTLES DRAWN AEROBIC ONLY Blood Culture adequate volume   Culture   Final    NO GROWTH 5 DAYS Performed at [facility], [place] [address] [place], [place] [address]    Report Status 02/03/2019 FINAL  Final  C difficile quick scan w PCR reflex     Status: None   Collection Time: 01/29/19  8:00 PM   Specimen: STOOL  Result Value Ref Range Status   C Diff antigen NEGATIVE NEGATIVE Final   C Diff toxin NEGATIVE NEGATIVE Final   C Diff interpretation No C. difficile detected.  Final    Comment: Performed at [facility], [place] [address] [place], [place] [address]  Gastrointestinal Panel by PCR , Stool     Status: None   Collection Time: 01/29/19  8:00 PM   Specimen: Stool  Result Value Ref Range Status   Campylobacter species NOT DETECTED NOT DETECTED Final   Plesimonas shigelloides NOT DETECTED NOT DETECTED Final   Salmonella species NOT DETECTED NOT DETECTED Final   Yersinia enterocolitica NOT DETECTED NOT DETECTED Final   Vibrio species NOT DETECTED NOT DETECTED Final   Vibrio cholerae NOT  DETECTED NOT DETECTED Final   Enteroaggregative E coli (EAEC) NOT DETECTED NOT DETECTED Final   Enteropathogenic E coli (EPEC) NOT DETECTED NOT DETECTED Final   Enterotoxigenic E coli (ETEC) NOT DETECTED NOT DETECTED Final   Shiga like toxin producing E coli (STEC) NOT DETECTED NOT DETECTED Final   Shigella/Enteroinvasive E coli (EIEC) NOT DETECTED NOT DETECTED Final   Cryptosporidium NOT DETECTED NOT DETECTED Final   Cyclospora cayetanensis NOT DETECTED NOT DETECTED Final   Entamoeba histolytica NOT DETECTED NOT DETECTED Final   Giardia lamblia NOT DETECTED NOT DETECTED Final   Adenovirus F40/41 NOT DETECTED NOT DETECTED Final   Astrovirus NOT DETECTED NOT DETECTED Final   Norovirus GI/GII NOT DETECTED NOT DETECTED Final   Rotavirus A NOT DETECTED NOT DETECTED Final   Sapovirus (I, II, IV, and V) NOT DETECTED NOT DETECTED Final    Comment: Performed at [facility], [address] [place], [place] [address]  MRSA PCR Screening     Status: None   Collection Time: 01/29/19 10:50 PM   Specimen: Nasal Mucosa; Nasopharyngeal  Result Value Ref Range Status   MRSA by PCR NEGATIVE NEGATIVE Final    Comment:        The GeneXpert MRSA Assay (FDA approved for NASAL specimens only), is one component of a comprehensive MRSA colonization surveillance program. It is not intended to diagnose MRSA infection nor to guide or monitor treatment for MRSA infections. Performed at [facility], [place] [address] [place], [place] [address]     Signed: Terrilee Croak  [facility] 02/05/2019, 3:02 PM

## 2019-02-05 NOTE — Progress Notes (Signed)
Subjective: The patient reports improvement in diarrhea.  He has had 1 bowel movement today, states had only 2 bowel movements yesterday.  He received hemodialysis yesterday and is under the impression that he will be discharged today or tomorrow.  Objective: Vital signs in last 24 hours: Temp:  [98.9 F (37.2 C)-99.7 F (37.6 C)] 99.1 F (37.3 C) (06/22 0944) Pulse Rate:  [87-98] 98 (06/22 0944) Resp:  [18] 18 (06/22 0944) BP: (101-112)/(70-75) 106/75 (06/22 0944) SpO2:  [95 %-99 %] 95 % (06/22 0944) Weight:  [50.7 kg] 50.7 kg (06/22 0500) Weight change: 0 kg Last BM Date: 02/04/19  PE: Chronically ill-appearing GENERAL: Not in distress ABDOMEN: Nondistended EXTREMITIES: no deformity  Lab Results: Results for orders placed or performed during the hospital encounter of 01/29/19 (from the past 48 hour(s))  Glucose, capillary     Status: Abnormal   Collection Time: 02/03/19  4:25 PM  Result Value Ref Range   Glucose-Capillary 116 (H) 70 - 99 mg/dL  Glucose, capillary     Status: Abnormal   Collection Time: 02/03/19  8:29 PM  Result Value Ref Range   Glucose-Capillary 132 (H) 70 - 99 mg/dL  Glucose, capillary     Status: Abnormal   Collection Time: 02/04/19  7:06 AM  Result Value Ref Range   Glucose-Capillary 115 (H) 70 - 99 mg/dL  Renal function panel (daily at 0500)     Status: Abnormal   Collection Time: 02/04/19  8:08 AM  Result Value Ref Range   Sodium 135 135 - 145 mmol/L   Potassium 3.4 (L) 3.5 - 5.1 mmol/L   Chloride 101 98 - 111 mmol/L   CO2 23 22 - 32 mmol/L   Glucose, Bld 159 (H) 70 - 99 mg/dL   BUN 21 (H) 6 - 20 mg/dL   Creatinine, Ser 6.11 (H) 0.61 - 1.24 mg/dL   Calcium 8.2 (L) 8.9 - 10.3 mg/dL   Phosphorus 3.1 2.5 - 4.6 mg/dL   Albumin 3.3 (L) 3.5 - 5.0 g/dL   GFR calc non Af Amer 10 (L) >60 mL/min   GFR calc Af Amer 11 (L) >60 mL/min   Anion gap 11 5 - 15    Comment: Performed at [facility], [address] [place], [place] [address]   Magnesium     Status: None   Collection Time: 02/04/19  8:08 AM  Result Value Ref Range   Magnesium 1.7 1.7 - 2.4 mg/dL    Comment: Performed at [facility], [place] [address], [place], [place] [address]  APTT     Status: None   Collection Time: 02/04/19  8:08 AM  Result Value Ref Range   aPTT 30 24 - 36 seconds    Comment: Performed at [facility] [address], [place], [place] [address]  CBC     Status: Abnormal   Collection Time: 02/04/19  8:08 AM  Result Value Ref Range   WBC 10.8 (H) 4.0 - 10.5 K/uL   RBC 3.05 (L) 4.22 - 5.81 MIL/uL   Hemoglobin 8.5 (L) 13.0 - 17.0 g/dL   HCT 26.7 (L) 39.0 - 52.0 %   MCV 87.5 80.0 - 100.0 fL   MCH 27.9 26.0 - 34.0 pg   MCHC 31.8 30.0 - 36.0 g/dL   RDW 15.3 11.5 - 15.5 %   Platelets 71 (L) 150 - 400 K/uL    Comment: REPEATED TO VERIFY SPECIMEN CHECKED FOR CLOTS Immature Platelet Fraction may be clinically  indicated, consider ordering this additional test [id number] CONSISTENT WITH PREVIOUS RESULT    nRBC 0.0 0.0 - 0.2 %    Comment: Performed at [facility], [place] [address], [place], [place] [address]  Glucose, capillary     Status: Abnormal   Collection Time: 02/04/19 11:23 AM  Result Value Ref Range   Glucose-Capillary 189 (H) 70 - 99 mg/dL  Glucose, capillary     Status: Abnormal   Collection Time: 02/04/19  4:09 PM  Result Value Ref Range   Glucose-Capillary 137 (H) 70 - 99 mg/dL  Glucose, capillary     Status: Abnormal   Collection Time: 02/04/19  9:43 PM  Result Value Ref Range   Glucose-Capillary 118 (H) 70 - 99 mg/dL  Renal function panel (daily at 0500)     Status: Abnormal   Collection Time: 02/05/19  5:31 AM  Result Value Ref Range   Sodium 134 (L) 135 - 145 mmol/L   Potassium 3.7 3.5 - 5.1 mmol/L   Chloride 99 98 - 111 mmol/L   CO2 25 22 - 32 mmol/L   Glucose, Bld 114 (H) 70 - 99 mg/dL   BUN 22 (H) 6 - 20 mg/dL   Creatinine, Ser 6.34 (H) 0.61 - 1.24 mg/dL   Calcium 8.7 (L) 8.9 - 10.3 mg/dL    Phosphorus 4.1 2.5 - 4.6 mg/dL   Albumin 3.4 (L) 3.5 - 5.0 g/dL   GFR calc non Af Amer 9 (L) >60 mL/min   GFR calc Af Amer 11 (L) >60 mL/min   Anion gap 10 5 - 15    Comment: Performed at [facility], [place] [address] [place], [place] [address]  Magnesium     Status: None   Collection Time: 02/05/19  5:31 AM  Result Value Ref Range   Magnesium 1.7 1.7 - 2.4 mg/dL    Comment: Performed at [facility] [address], [place], [place] [address]  APTT     Status: None   Collection Time: 02/05/19  5:31 AM  Result Value Ref Range   aPTT 34 24 - 36 seconds    Comment: Performed at [facility] [address] [place], [place] [address]  Glucose, capillary     Status: Abnormal   Collection Time: 02/05/19  7:17 AM  Result Value Ref Range   Glucose-Capillary 110 (H) 70 - 99 mg/dL  Glucose, capillary     Status: Abnormal   Collection Time: 02/05/19 11:45 AM  Result Value Ref Range   Glucose-Capillary 138 (H) 70 - 99 mg/dL    Studies/Results: No results found.  Medications: I have reviewed the patient's current medications.  Assessment: Chronic diarrhea, status post biopsy of mild diffuse colitis noted on colonoscopy, pathology pending. Diarrhea improved on Lomotil and colestipol  End-stage renal disease on hemodialysis History of renal transplant, off mycophenolate, continued on tacrolimus  Plan: Okay to DC from GI standpoint. Biopies will be followed as an outpatient. Continue colestipol 2 g twice a day, Lomotil may be used twice daily as needed. Will DC [place]. We will sign off, please recall if needed.    Ronnette Juniper 02/05/2019, 1:28 PM   Pager [phone] If no answer or after 5 PM call [phone]

## 2019-02-05 NOTE — Progress Notes (Signed)
Patient refused to update his wife and said he is going to do later in the evening.

## 2019-02-06 ENCOUNTER — Telehealth: Payer: Self-pay | Admitting: *Deleted

## 2019-02-06 LAB — VITAMIN C: Vitamin C: 0.1 mg/dL — ABNORMAL LOW (ref 0.4–2.0)

## 2019-02-06 NOTE — Telephone Encounter (Signed)
Transition Care Management Follow-up Telephone Call  Date of discharge and from where:02/05/2019 [place]   How have you been since you were released from the hospital? Better, going to Dialysis Tues,Thurs,Sat   Any questions or concerns? No   Items Reviewed:  Did the pt receive and understand the discharge instructions provided? Yes   Medications obtained and verified? Yes   Any new allergies since your discharge? No   Dietary orders reviewed? Yes  Do you have support at home? Yes   Other (ie: DME, Home Health, etc) No  Functional Questionnaire: (I = Independent and D = Dependent) ADL's: I  Bathing/Dressing- I   Meal Prep- I  Eating- I  Maintaining continence- I  Transferring/Ambulation- I  Managing Meds- I   Follow up appointments reviewed:    PCP Hospital f/u appt confirmed? Yes  Patient stated that he will call back later in the week to make an appointment. Stated that he has Dialysis.  [facility] f/u appt confirmed? No  Patient stated that he will call back  Are transportation arrangements needed? No   If their condition worsens, is the pt aware to call  their PCP or go to the ED? Yes  Was the patient provided with contact information for the PCP's office or ED? Yes  Was the pt encouraged to call back with questions or concerns? Yes

## 2019-02-07 LAB — ZINC: Zinc: 72 ug/dL (ref 56–134)

## 2019-02-09 ENCOUNTER — Encounter: Payer: Self-pay | Admitting: Nurse Practitioner

## 2019-02-09 ENCOUNTER — Other Ambulatory Visit: Payer: Self-pay

## 2019-02-09 ENCOUNTER — Ambulatory Visit (INDEPENDENT_AMBULATORY_CARE_PROVIDER_SITE_OTHER): Payer: Medicare Other | Admitting: Nurse Practitioner

## 2019-02-09 VITALS — BP 90/56 | HR 103 | Temp 98.6°F | Ht 65.0 in | Wt 113.0 lb

## 2019-02-09 DIAGNOSIS — N186 End stage renal disease: Secondary | ICD-10-CM | POA: Diagnosis not present

## 2019-02-09 DIAGNOSIS — I951 Orthostatic hypotension: Secondary | ICD-10-CM

## 2019-02-09 DIAGNOSIS — E785 Hyperlipidemia, unspecified: Secondary | ICD-10-CM | POA: Diagnosis not present

## 2019-02-09 DIAGNOSIS — N185 Chronic kidney disease, stage 5: Secondary | ICD-10-CM

## 2019-02-09 DIAGNOSIS — R197 Diarrhea, unspecified: Secondary | ICD-10-CM

## 2019-02-09 DIAGNOSIS — R112 Nausea with vomiting, unspecified: Secondary | ICD-10-CM | POA: Diagnosis not present

## 2019-02-09 DIAGNOSIS — E43 Unspecified severe protein-calorie malnutrition: Secondary | ICD-10-CM

## 2019-02-09 DIAGNOSIS — D649 Anemia, unspecified: Secondary | ICD-10-CM

## 2019-02-09 DIAGNOSIS — E1122 Type 2 diabetes mellitus with diabetic chronic kidney disease: Secondary | ICD-10-CM | POA: Diagnosis not present

## 2019-02-09 DIAGNOSIS — Z794 Long term (current) use of insulin: Secondary | ICD-10-CM

## 2019-02-09 MED ORDER — "PEN NEEDLES 3/16"" 31G X 5 MM MISC": 11 refills | Status: DC

## 2019-02-09 MED ORDER — NOVOLOG FLEXPEN 100 UNIT/ML ~~LOC~~ SOPN: PEN_INJECTOR | SUBCUTANEOUS | 11 refills | Status: DC

## 2019-02-09 NOTE — Patient Instructions (Addendum)
Referral placed to gastroenterology for ongoing follow up on nausea/vomiting and to follow up results from hospital  To make appt with nephrologist.   To make appt with nutritionist - we have put in for televisit

## 2019-02-09 NOTE — Progress Notes (Signed)
Careteam: Patient Care Team: Alexis Chandler, NP as PCP - General (Geriatric Medicine) Alexis Iba., MD as Referring Physician (Ophthalmology) Alexis Perla, MD as Consulting Physician (Cardiology) Alexis Oh, MD as Consulting Physician (Nephrology) Alexis Minks, MD as Referring Physician (Surgery)  Advanced Directive information    Allergies  Allergen Reactions   Lactose Intolerance (Gi) Diarrhea    No whole milk!!   Other Other (See Comments)    Soy milk burns his tongue   Pollen Extract Other (See Comments)    Itching, watery eyes and sneezing.   Tape Rash    PLEASE USE COBAN WRAP Pt can't have paper tape    Chief Complaint  Patient presents with   [facility] follow-up from 01/29/2019-02/05/2019 for hypovolemia. Moderate fall risk. Patient with nausea evey am,which prevents him from eating breakfast at times. Here with wife.   Medication Management    Discuss Novolog, kidney specialist changed and patient would like it changed back      HPI: Patient is a 48 y.o. male seen in the office today for hospital follow up. Pt with history of ESRD status post second renal transplant in 2015, DM-2, anemia of chronic disease, hyperlipidemiaandnoncompliance.  He was admitted to ICU with diarrhea, leukocytosis, acute on chronic renal failure with profound anion gap metabolic acidosis and hyperkalemia on 01/29/2019. This is a second presentation with dehydration/acidosis and ongoing diarrhea. He was previously hospitalized in May and never followed up with specialist or our office.   He was restarted on dialysis after last hospitalization. Being weaned off immunosuppressants gradually.  Per nephrology recommendation, Myfortic was stopped.  Tacrolimus dose reduced, dapsone to continue. He has not followed up with nephrology.   Diarrhea/gastroenteritis- Colonoscopy on 6/18 showed erythematous, inflamed and thickened fold mucosa throughout the  colon and rectum.GI recommendedto low fiber diet, and colestipol 2 g BID.  Lomotil twice daily as needed. Diarrhea hadstopped prior to leaving the hospital but now having nausea. Can not keep down food. Not able to eat a lot due to sickness.   DKA/IDDM-2 with ESRD and retinopathy- he was maintained in hospital on tresiba 5 units but discharged with SSI only. A1c 7.2 in May.  Only needing 2-3 units. Needing to change the Rx to pen vs vial- vail not covered by insurance.   Moderate protein calorie malnutrition, ongoing weight loss,  weight down to 111 lbs on 6/22, nutrition was consulted during hospitalization.   Anemia- hgb dropped to 5.9 in hospital and 1 units PRBC was given. Lab work gotten at dialysis  Orthostatic hypotension- blood pressure dropped in hospital therefore coreg was stopped, started on midodrine** Review of Systems:  Review of Systems  Constitutional: Positive for malaise/fatigue. Negative for chills, fever and weight loss.  HENT: Negative for tinnitus.   Respiratory: Negative for cough, sputum production and shortness of breath.   Cardiovascular: Negative for chest pain, palpitations and leg swelling.  Gastrointestinal: Positive for nausea and vomiting. Negative for abdominal pain, constipation, diarrhea and heartburn.  Genitourinary: Negative for dysuria, frequency and urgency.       Some urination  Musculoskeletal: Negative for back pain, falls, joint pain and myalgias.  Skin: Negative.   Neurological: Positive for weakness. Negative for dizziness and headaches.  Psychiatric/Behavioral: Positive for memory loss. Negative for depression. The patient does not have insomnia.     Past Medical History:  Diagnosis Date   Anemia    CKD (chronic kidney disease), stage III ([place])  Diabetes mellitus 12/2008   A1C 8.3 12/23/08, 24hr urine protein 2793 mg/day, SPEP neg, proliferative BDR s/p photocoagulation 08/2009 done at [facility] and f/u by DR Groat   ESRD (end stage  renal disease) on dialysis ([place])    1.Initiated HD via right per cate 12/30/2008 (MWF schedule at [facility]) 2.Left upper extremity A-V fistula by Dr Oneida Alar 12/30/2008 3.Aemia: Received Venofer 12/30/08 Ferritin 179 % sat 12 12/23/08, 4. Secondary Hyperparathyroidism- PTH 188, P 5.4, Ca 9.2 12/2008   Hemodialysis-associated hypotension    coreg d/c'd   Herniated lumbar disc without myelopathy    History of blood transfusion    Hyperparathyroidism due to renal insufficiency (HCC)    Hypertension    resloved after initating HD, now with post HD hypotension   Hypocalcemia    Hypomagnesemia    Kidney transplant as cause of abnormal reaction or later complication [id number]   [facility]   Non-ischemic cardiomyopathy ([place])    EF 20-25% 12/23/2008>>EF 40-45% 03/2009>>EF ??? 7/?/2011   Occult blood positive stool    Psoriasis    scalp   Thrombocytopenia (HCC)    Vitamin B 12 deficiency    Past Surgical History:  Procedure Laterality Date   AV FISTULA PLACEMENT     left   AV FISTULA PLACEMENT  09/12/2012   Procedure: INSERTION OF ARTERIOVENOUS (AV) GORE-TEX GRAFT ARM;  Surgeon: Elam Dutch, MD;  Location: [facility] OR;  Service: Vascular;  Laterality: Left;  KIDNEY TRANSPLANT FAILED   BIOPSY  02/01/2019   Procedure: BIOPSY;  Surgeon: Ronnette Juniper, MD;  Location: [facility] ENDOSCOPY;  Service: Gastroenterology;;   CATARACT EXTRACTION, BILATERAL     COLONOSCOPY WITH PROPOFOL N/A 02/01/2019   Procedure: COLONOSCOPY WITH PROPOFOL;  Surgeon: Ronnette Juniper, MD;  Location: [place];  Service: Gastroenterology;  Laterality: N/A;   EYE SURGERY     INSERTION OF DIALYSIS CATHETER  07/14/2012   Procedure: INSERTION OF DIALYSIS CATHETER;  Surgeon: Mal Misty, MD;  Location: [facility] OR;  Service: Vascular;  Laterality: Right;  Ultrasound Guided Insertion of Internal Jugular Dialysis Catheter   IR THROMBECTOMY AV FISTULA W/THROMBOLYSIS/PTA INC/SHUNT/IMG LEFT Left 12/28/2018   IR US GUIDE VASC  [place] LEFT  12/28/2018   KIDNEY TRANSPLANT  2013   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   KIDNEY TRANSPLANT  12/2013   second kidney transplant   lumbar disc sugery   10/17/2017   LUMBAR [place] TRANSPLANTED ORGAN  08/11/12   PHOTOCOAGULATION     for diabetic retinopathy 08/1999   SHUNTOGRAM N/A 07/11/2012   Procedure: Earney Mallet;  Surgeon: Serafina Mitchell, MD;  Location: [facility] CATH LAB;  Service: Cardiovascular;  Laterality: N/A;   Social History:   reports that he has never smoked. He has never used smokeless tobacco. He reports that he does not drink alcohol or use drugs.  Family History  Problem Relation Age of Onset   Diabetes Father    Kidney disease Father    Other Father        amputation   Hypertension Mother    Dementia Mother    Diabetes Sister    Hypertension Sister    Diabetes Sister    Kidney disease Sister    Diabetes Sister    Hypertension Brother    Diabetes Brother    CAD Neg Hx    Sudden Cardiac Death Neg Hx    Congestive Heart Failure Neg Hx    Colon cancer Neg Hx  Rectal cancer Neg Hx    Esophageal cancer Neg Hx    Liver cancer Neg Hx     Medications: Patient's Medications  New Prescriptions   No medications on file  Previous Medications   ASPIRIN EC 81 MG TABLET    Take 81 mg by mouth daily.   ATORVASTATIN (LIPITOR) 20 MG TABLET    Take one tablet by mouth at bedtime for cholesterol. NEEDS AN APPOINTMENT BEFORE ANYMORE FUTURE REFILLS.   COLESTIPOL (COLESTID) 1 G TABLET    Take 2 tablets (2 g total) by mouth 2 (two) times daily for 7 days.   DAPSONE 25 MG TABLET    Take 50 mg by mouth daily.   DIPHENOXYLATE-ATROPINE (LOMOTIL) 2.5-0.025 MG TABLET    Take 1 tablet by mouth 2 (two) times daily as needed for up to 7 days for diarrhea or loose stools.   FOLIC ACID (FOLVITE) 1 MG TABLET    Take 1 mg by mouth daily.    INSULIN ASPART (NOVOLOG) 100 UNIT/ML INJECTION    Inject 0-15 Units into the skin 3  (three) times daily with meals.   MIDODRINE (PROAMATINE) 5 MG TABLET    Take 1 tablet (5 mg total) by mouth 3 (three) times daily with meals for 30 days.   MULTIVITAMIN (RENA-VIT) TABS TABLET    Take 1 tablet by mouth at bedtime for 30 days.   OMEPRAZOLE (PRILOSEC) 20 MG CAPSULE    Take 1 capsule (20 mg total) by mouth daily.   TACROLIMUS (PROGRAF) 1 MG CAPSULE    Take 3 capsules (3 mg total) by mouth 2 (two) times daily.  Modified Medications   No medications on file  Discontinued Medications   No medications on file    Physical Exam:  Vitals:   02/09/19 1516  BP: (!) 90/56  Pulse: (!) 103  Temp: 98.6 F (37 C)  TempSrc: Oral  SpO2: 98%  Weight: 113 lb (51.3 kg)  Height: 5\' 5"  (1.651 m)   Body mass index is 18.8 kg/m. Wt Readings from Last 3 Encounters:  02/09/19 113 lb (51.3 kg)  02/05/19 111 lb 12.4 oz (50.7 kg)  01/03/19 124 lb 5.4 oz (56.4 kg)    Physical Exam Constitutional:      General: He is not in acute distress.    Appearance: He is well-developed. He is not diaphoretic.     Comments: Thin male  HENT:     Head: Normocephalic and atraumatic.     Mouth/Throat:     Pharynx: No oropharyngeal exudate.  Eyes:     Conjunctiva/sclera: Conjunctivae normal.     Pupils: Pupils are equal, round, and reactive to light.  Neck:     Musculoskeletal: Normal range of motion and neck supple.  Cardiovascular:     Rate and Rhythm: Normal rate and regular rhythm.     Heart sounds: Normal heart sounds.  Pulmonary:     Effort: Pulmonary effort is normal.     Breath sounds: Normal breath sounds.  Abdominal:     General: Bowel sounds are normal. There is no distension.     Palpations: Abdomen is soft.     Tenderness: There is no abdominal tenderness.  Musculoskeletal:        General: No tenderness.  Skin:    General: Skin is warm and dry.     Coloration: Skin is pale.  Neurological:     Mental Status: He is alert and oriented to person, place, and time.  Labs  reviewed: Basic Metabolic Panel: Recent Labs    02/03/19 0539 02/04/19 0808 02/05/19 0531  NA 136 135 134*  K 3.2* 3.4* 3.7  CL 100 101 99  CO2 25 23 25   GLUCOSE 125* 159* 114*  BUN 18 21* 22*  CREATININE 4.39* 6.11* 6.34*  CALCIUM 7.6* 8.2* 8.7*  MG 1.8 1.7 1.7  PHOS 2.4* 3.1 4.1   Liver Function Tests: Recent Labs    08/01/18 1620 12/24/18 1804  01/29/19 1245  02/03/19 0539 02/04/19 0808 02/05/19 0531  AST 10 9*  --  8*  --   --   --   --   ALT 4* 12  --  13  --   --   --   --   ALKPHOS  --  113  --  118  --   --   --   --   BILITOT 0.5 0.3  --  0.5  --   --   --   --   PROT 6.4 7.3  --  7.0  --   --   --   --   ALBUMIN  --  4.5   < > 4.4   < > 3.3* 3.3* 3.4*   < > = values in this interval not displayed.   Recent Labs    12/24/18 1804 01/29/19 1626 02/01/19 0520  LIPASE 768* 244* 71*   No results for input(s): AMMONIA in the last 8760 hours. CBC: Recent Labs    01/03/19 0533 01/03/19 1102 01/29/19 1245  02/02/19 0543 02/03/19 0539 02/04/19 0808  WBC 5.4 5.5 26.2*   < > 10.0 8.7 10.8*  NEUTROABS 3.4 3.6 24.1*  --   --   --   --   HGB 8.6* 8.7* 9.2*   < > 7.9* 8.1* 8.5*  HCT 26.6* 26.7* 30.2*   < > 23.6* 24.5* 26.7*  MCV 83.9 83.2 90.1   < > 84.3 84.5 87.5  PLT 100* 102* 127*   < > 83* 72* 71*   < > = values in this interval not displayed.   Lipid Panel: Recent Labs    08/01/18 1620 12/25/18 0841  CHOL 194 72  HDL 31* 22*  LDLCALC 135* 34  TRIG 151* 80  CHOLHDL 6.3* 3.3   TSH: No results for input(s): TSH in the last 8760 hours. A1C: Lab Results  Component Value Date   HGBA1C 7.2 (H) 12/25/2018     Assessment/Plan 1. Nausea and vomiting, intractability of vomiting not specified, unspecified vomiting type -ongoing since hospitalization.  - Ambulatory referral to Gastroenterology - Amb ref to Medical Nutrition Therapy-MNT  2. Type 2 diabetes mellitus with stage 5 chronic kidney disease not on chronic dialysis, with long-term current  use of insulin (HCC) -blood sugars continue to be controlled using novolog only. Flex pen sent to pharmacy as they had a vial sent to pharmacy and they request pen.  - insulin aspart (NOVOLOG FLEXPEN) 100 UNIT/ML FlexPen; Inject 0-15 Units into the skin 3 (three) times daily with meals  Dispense: 15 mL; Refill: 11 - Insulin Pen Needle (PEN NEEDLES 3/16") 31G X 5 MM MISC; Use as directed Dx:E11.22  Dispense: 100 each; Refill: 11 - Amb ref to Medical Nutrition Therapy-MNT  3. Hyperlipidemia with target low density lipoprotein (LDL) cholesterol less than 100 mg/dL -LDL at goal on last labs, continues on lipitor.   4. End stage renal disease ([place]) Back on dialysis at this time. Encouraged follow up with nephrology due  to progressive worsening of renal disease with transplant.  - Amb ref to Medical Nutrition Therapy-MNT  5. Anemia, unspecified type Followed at dialysis, declines follow up blood work today.    6. Diarrhea, unspecified type Improved at this time, continues on colestipol 2 tablets BID for 7 days. GI referral sent for further evaluation.  7. Severe malnutrition ([place]) -ongoing, weight has improved since hospitalization but overall has lost significant weight - pt with chronic nausea, vomiting and diarrhea -limited on supplements due to ESRD with dialysis and DM.  - Amb ref to Medical Nutrition Therapy-MNT to help with nutrition with co-morbidies.   8. Orthostatic hypotension -continues on midodrine due to hypotension. He is able to keep down fluid despite nausea.  Next appt: 3 months, sooner if needed  [name] K. [place], [facility] [phone]

## 2019-02-13 ENCOUNTER — Other Ambulatory Visit: Payer: Self-pay | Admitting: *Deleted

## 2019-02-13 MED ORDER — ACCU-CHEK AVIVA PLUS VI STRP: 1.0000 | ORAL_STRIP | Freq: Four times a day (QID) | 6 refills | Status: AC

## 2019-02-13 MED ORDER — ACCU-CHEK FASTCLIX LANCETS MISC: 1.0000 | Freq: Four times a day (QID) | 6 refills | Status: AC

## 2019-02-27 ENCOUNTER — Telehealth: Payer: Self-pay

## 2019-02-27 NOTE — Telephone Encounter (Signed)
Lm on vm 

## 2019-02-28 ENCOUNTER — Ambulatory Visit (INDEPENDENT_AMBULATORY_CARE_PROVIDER_SITE_OTHER): Payer: Medicare Other | Admitting: Internal Medicine

## 2019-02-28 DIAGNOSIS — Z5329 Procedure and treatment not carried out because of patient's decision for other reasons: Secondary | ICD-10-CM

## 2019-02-28 DIAGNOSIS — Z91199 Patient's noncompliance with other medical treatment and regimen due to unspecified reason: Secondary | ICD-10-CM

## 2019-02-28 NOTE — Telephone Encounter (Signed)
No show

## 2019-02-28 NOTE — Progress Notes (Signed)
The patient was contacted via WebEx for his appointment at the appropriate time.  No response.  Also, prior to this appointment multiple messages were left on his only phone voicemail by the previsit screening nurse.  Finally, I left a message on his voicemail as well putting him to reschedule his appointment if he wished.  I will send a copy to his PCP.

## 2019-04-19 NOTE — Progress Notes (Deleted)
Rhina Brackett, NP Reason for referral-cardiomyopathy  HPI: 48 year old male for evaluation of cardiomyopathy at request of Sherrie Mustache, NP.  Patient seen previously but not since November 2016. Previous cath in 2010 revealed normal coronary arteries. A TSH, ferritin, HIV, and ACE level were normal. Rheumatoid factor and ANA were also normal. He was treated medically for nonischemic cardiomyopathy. Echo in July of 2011 revealed an ejection fraction of 30-35%, trace MR and TR. Nuclear study July 2013 showed ejection fraction 58% and small reversible apical defect treated medically.  Most recent echocardiogram 6/20 showed ejection fraction 65%; there was turbulent flow in the subaortic area and there was consideration of supracristal VSD.  However I personally reviewed this and there is no evidence of ventricular septal defect.  Current Outpatient Medications  Medication Sig Dispense Refill  . Accu-Chek FastClix Lancets MISC 1 each by In Vitro route 4 (four) times daily. DX:E11.9 100 each 6  . aspirin EC 81 MG tablet Take 81 mg by mouth daily.    Marland Kitchen atorvastatin (LIPITOR) 20 MG tablet Take one tablet by mouth at bedtime for cholesterol. NEEDS AN APPOINTMENT BEFORE ANYMORE FUTURE REFILLS. 30 tablet 0  . colestipol (COLESTID) 1 g tablet Take 2 tablets (2 g total) by mouth 2 (two) times daily for 7 days. 28 tablet 0  . dapsone 25 MG tablet Take 50 mg by mouth daily.    . folic acid (FOLVITE) 1 MG tablet Take 1 mg by mouth daily.   6  . glucose blood (ACCU-CHEK AVIVA PLUS) test strip 1 each by Other route 4 (four) times daily. DX:E11.9 100 each 6  . insulin aspart (NOVOLOG FLEXPEN) 100 UNIT/ML FlexPen Inject 0-15 Units into the skin 3 (three) times daily with meals 15 mL 11  . Insulin Pen Needle (PEN NEEDLES 3/16") 31G X 5 MM MISC Use as directed Dx:E11.22 100 each 11  . omeprazole (PRILOSEC) 20 MG capsule Take 1 capsule (20 mg total) by mouth daily. 30 capsule 3  . tacrolimus  (PROGRAF) 1 MG capsule Take 3 capsules (3 mg total) by mouth 2 (two) times daily.     No current facility-administered medications for this visit.     Allergies  Allergen Reactions  . Lactose Intolerance (Gi) Diarrhea    No whole milk!!  . Other Other (See Comments)    Soy milk burns his tongue  . Pollen Extract Other (See Comments)    Itching, watery eyes and sneezing.  . Tape Rash    PLEASE USE COBAN WRAP Pt can't have paper tape    Past Medical History:  Diagnosis Date  . Anemia   . CKD (chronic kidney disease), stage III ([place])   . Diabetes mellitus 12/2008   A1C 8.3 12/23/08, 24hr urine protein 2793 mg/day, SPEP neg, proliferative BDR s/p photocoagulation 08/2009 done at [facility] and f/u by DR Groat  . ESRD (end stage renal disease) on dialysis ([place])    1.Initiated HD via right per cate 12/30/2008 (MWF schedule at [facility]) 2.Left upper extremity A-V fistula by Dr Oneida Alar 12/30/2008 3.Aemia: Received Venofer 12/30/08 Ferritin 179 % sat 12 12/23/08, 4. Secondary Hyperparathyroidism- PTH 188, P 5.4, Ca 9.2 12/2008  . Hemodialysis-associated hypotension    coreg d/c'd  . Herniated lumbar disc without myelopathy   . History of blood transfusion   . Hyperparathyroidism due to renal insufficiency ([place])   . Hypertension    resloved after initating HD, now with post HD hypotension  . Hypocalcemia   .  Hypomagnesemia   . Kidney transplant as cause of abnormal reaction or later complication [id number]   [facility]  . Non-ischemic cardiomyopathy ([place])    EF 20-25% 12/23/2008>>EF 40-45% 03/2009>>EF ??? 7/?/2011  . Occult blood positive stool   . Psoriasis    scalp  . Thrombocytopenia ([place])   . Vitamin B 12 deficiency     Past Surgical History:  Procedure Laterality Date  . AV FISTULA PLACEMENT     left  . AV FISTULA PLACEMENT  09/12/2012   Procedure: INSERTION OF ARTERIOVENOUS (AV) GORE-TEX GRAFT ARM;  Surgeon: Elam Dutch, MD;  Location: [place];  Service: Vascular;   Laterality: Left;  KIDNEY TRANSPLANT FAILED  . BIOPSY  02/01/2019   Procedure: BIOPSY;  Surgeon: Ronnette Juniper, MD;  Location: [place];  Service: Gastroenterology;;  . CATARACT EXTRACTION, BILATERAL    . COLONOSCOPY WITH PROPOFOL N/A 02/01/2019   Procedure: COLONOSCOPY WITH PROPOFOL;  Surgeon: Ronnette Juniper, MD;  Location: [place];  Service: Gastroenterology;  Laterality: N/A;  . EYE SURGERY    . INSERTION OF DIALYSIS CATHETER  07/14/2012   Procedure: INSERTION OF DIALYSIS CATHETER;  Surgeon: Mal Misty, MD;  Location: [place];  Service: Vascular;  Laterality: Right;  Ultrasound Guided Insertion of Internal Jugular Dialysis Catheter  . IR THROMBECTOMY AV FISTULA W/THROMBOLYSIS/PTA INC/SHUNT/IMG LEFT Left 12/28/2018  . IR US GUIDE VASC ACCESS LEFT  12/28/2018  . 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 KIDNEY TRANSPLANT  12/2013   second kidney transplant  . lumbar disc sugery   10/17/2017  . LUMBAR DISC SURGERY    . NEPHRECTOMY TRANSPLANTED ORGAN  08/11/12  . PHOTOCOAGULATION     for diabetic retinopathy 08/1999  . SHUNTOGRAM N/A 07/11/2012   Procedure: Earney Mallet;  Surgeon: Serafina Mitchell, MD;  Location[facility] CATH LAB;  Service: Cardiovascular;  Laterality: N/A;    Social History   Socioeconomic History  . Marital status: Married    Spouse name: Not on file  . Number of children: 2  . Years of education: Not on file  . Highest education level: Not on file  Occupational History  . Occupation: disabled  Social Needs  . Financial resource strain: Somewhat hard  . Food insecurity    Worry: Never true    Inability: Never true  . Transportation needs    Medical: No    Non-medical: No  Tobacco Use  . Smoking status: Never Smoker  . Smokeless tobacco: Never Used  Substance and Sexual Activity  . Alcohol use: No    Alcohol/week: 0.0 standard drinks  . Drug use: No  . Sexual activity: Not on file  Lifestyle  . Physical activity    Days per week:  4 days    Minutes per session: 30 min  . Stress: Not at all  Relationships  . Social connections    Talks on phone: More than three times a week    Gets together: More than three times a week    Attends religious service: More than 4 times per year    Active member of club or organization: No    Attends meetings of clubs or organizations: Never    Relationship status: Married  . Intimate partner violence    Fear of current or ex partner: No    Emotionally abused: No    Physically abused: No    Forced sexual activity: No  Other Topics Concern  . Not on file  Social History Narrative   Social History      Diet?       Do you drink/eat things with caffeine? yes      Marital status?            married                        What year were you married?       Do you live in a house, apartment, assisted living, condo, trailer, etc.? apartment      Is it one or more stories? one      How many persons live in your home? 2      Do you have any pets in your home? (please list) no      Highest level of education completed?       Current or past profession: bus owner      Do you exercise?            yes                          Type & how often? Walking daily      Advanced Directives      Do you have a living will? no      Do you have a DNR form?            no                      If not, do you want to discuss one?      Do you have signed POA/HPOA for forms? no      Functional Status      Do you have difficulty bathing or dressing yourself? yes      Do you have difficulty preparing food or eating? no      Do you have difficulty managing your medications? yes      Do you have difficulty managing your finances? no      Do you have difficulty affording your medications? yes    Family History  Problem Relation Age of Onset  . Diabetes Father   . Kidney disease Father   . Other Father        amputation  . Hypertension Mother   . Dementia Mother   . Diabetes Sister    . Hypertension Sister   . Diabetes Sister   . Kidney disease Sister   . Diabetes Sister   . Hypertension Brother   . Diabetes Brother   . CAD Neg Hx   . Sudden Cardiac Death Neg Hx   . Congestive Heart Failure Neg Hx   . Colon cancer Neg Hx   . Rectal cancer Neg Hx   . Esophageal cancer Neg Hx   . Liver cancer Neg Hx     ROS: no fevers or chills, productive cough, hemoptysis, dysphasia, odynophagia, melena, hematochezia, dysuria, hematuria, rash, seizure activity, orthopnea, PND, pedal edema, claudication. Remaining systems are negative.  Physical Exam:   There were no vitals taken for this visit.  General:  Well developed/well nourished in NAD Skin warm/dry Patient not depressed No peripheral clubbing Back-normal HEENT-normal/normal eyelids Neck supple/normal carotid upstroke bilaterally; no bruits; no JVD; no thyromegaly chest - CTA/ normal expansion CV - RRR/normal S1 and S2; no murmurs, rubs or gallops;  PMI nondisplaced Abdomen -NT/ND, no HSM, no mass, + bowel sounds, no bruit 2+ femoral pulses,  no bruits Ext-no edema, chords, 2+ DP Neuro-grossly nonfocal  ECG - personally reviewed  A/P  1 cardiomyopathy-improved on most recent echocardiogram.  2 history of mildly abnormal nuclear study-no chest pain.  Previous catheterization revealed no coronary disease.  We will not pursue further evaluation.  3 hypertension-  4 end-stage renal disease-per nephrology.  Kirk Ruths, MD

## 2019-04-24 ENCOUNTER — Ambulatory Visit: Payer: Medicare Other | Admitting: Cardiology

## 2019-05-14 ENCOUNTER — Ambulatory Visit: Payer: Medicare Other | Admitting: Nurse Practitioner

## 2019-06-07 IMAGING — DX PORTABLE CHEST - 1 VIEW
1 series · 1 of 1 positions shown · non-contrast
Comparison: 01/18/2018

CLINICAL DATA: Tachypnea

EXAM:
PORTABLE CHEST 1 VIEW

[chest]
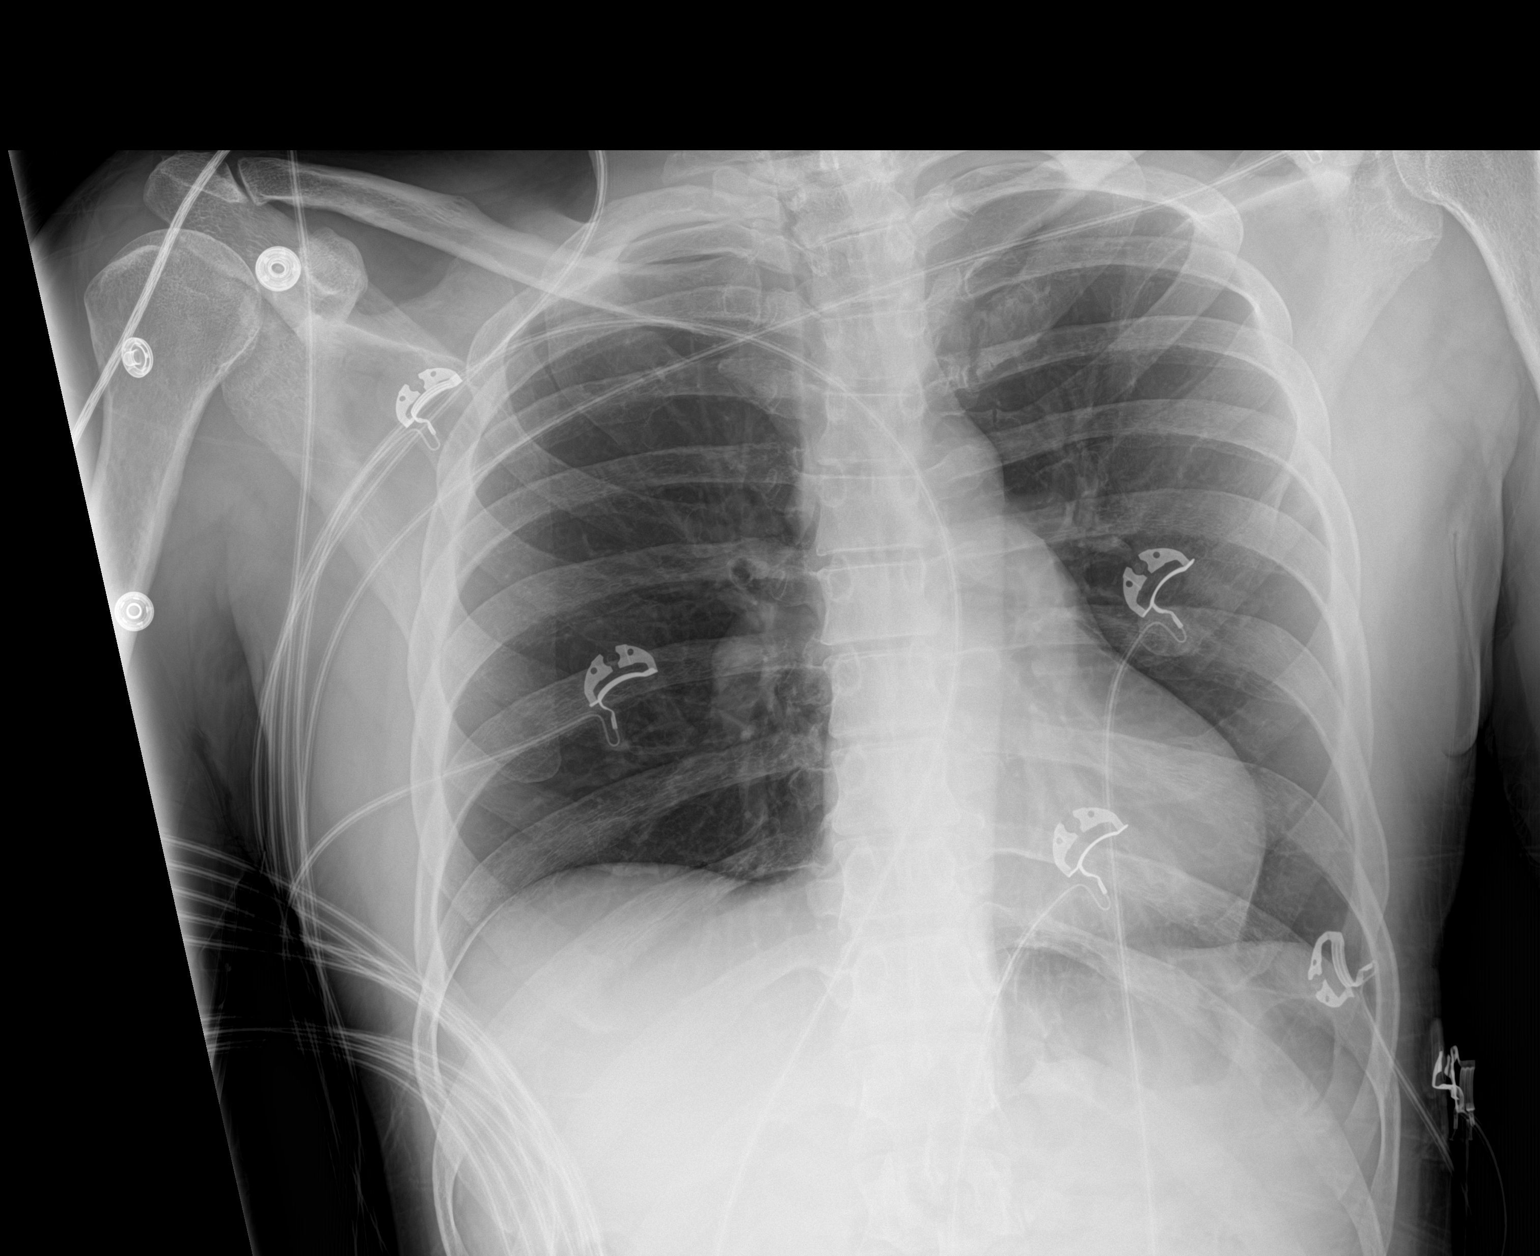

[1 of 1 positions shown; findings below may reference images not displayed]

FINDINGS: The heart size and mediastinal contours are within normal limits.
Both lungs are clear. The visualized skeletal structures are
unremarkable.
IMPRESSION: No acute abnormality of the lungs in AP portable projection.

## 2019-06-11 IMAGING — US IR THROMBECTOMY AV FISTULA W/THROMBOLYSIS/PTA INC/SHUNT/IMG LEFT
1 series · 1 of 1 positions shown · non-contrast
Comparison: Left forearm graft declot-12/22/2012.

INDICATION: History of end-stage renal disease with recently failed renal
transplant.
TECHNIQUE: Informed written consent was obtained from the patient after a
discussion of the risks, benefits and alternatives to treatment.
Questions regarding the procedure were encouraged and answered. A
timeout was performed prior to the initiation of the procedure.

[Series 1: ir thrombectomy av fistula w/thrombolysis/pta inc/ · 1 of 1 slices shown]
[im 1/1]
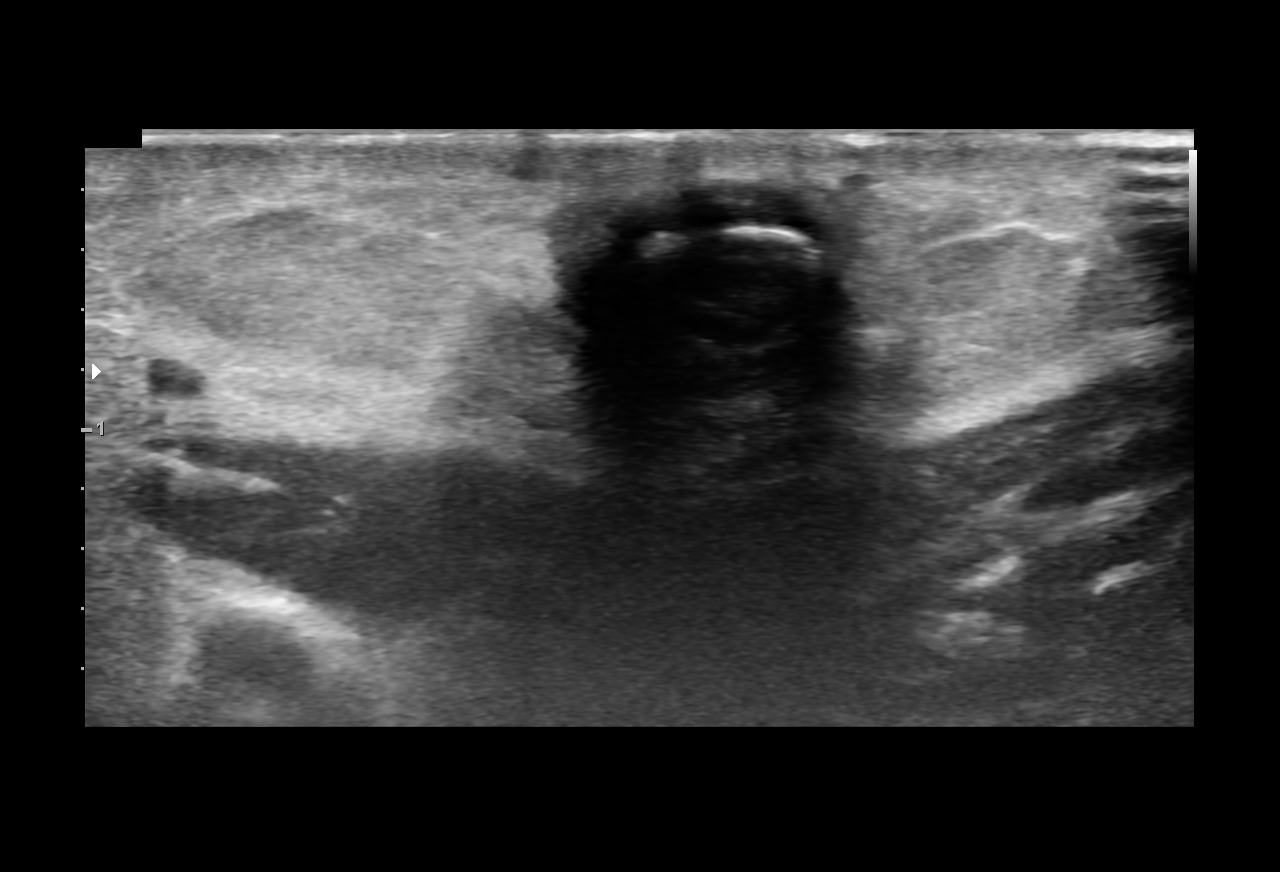

[1 of 1 positions shown; findings below may reference images not displayed]

Patient previously received dialysis via a left forearm graft which
was most recently intervened on 12/22/2012. The left forearm
dialysis graft has not been in use for the past 6 years however they
were able to provide one session of dialysis via the graft earlier
this week however unfortunately the graft has subsequently
thrombosed.

As such, patient presents now for attempted image guided
thrombolysis of the chronic left forearm dialysis graft.

EXAM:
1. FISTULALYSIS
2. ANGIOPLASTY OF VENOUS LIMB AND VENOUS ANASTOMOSIS
3. ULTRASOUND GUIDANCE FOR VENOUS ACCESS
MEDICATIONS:
Heparin 9000 units IV; TPA 4 mg into graft.

CONTRAST:  50 cc [id number]

ANESTHESIA/SEDATION:
Moderate (conscious) sedation was employed during this procedure. A
total of Versed 1.5 mg and Fentanyl 75 mcg was administered
intravenously.

Moderate Sedation Time: 56 minutes. The patient's level of
consciousness and vital signs were monitored continuously by
radiology nursing throughout the procedure under my direct
supervision.

FLUOROSCOPY TIME:  10 minutes, 24 seconds (11 mGy)

COMPLICATIONS:
None immediate.
On physical examination, the existing left forearm arm dialysis
graft was negative for palpable pulse or thrill. The skin overlying
the graft was prepped and draped in the usual sterile fashion, and a
sterile drape was applied covering the operative field. Maximum
barrier sterile technique with sterile gowns and gloves were used
for the procedure.

Under ultrasound guidance, the dialysis graft was accessed directed
towards the venous anastomosis with a micropuncture kit after the
overlying soft tissues were anesthetized with 1% lidocaine. An
ultrasound image was saved for documentation purposes. The
micropuncture sheath was exchange for a 7-French vascular sheath
over a guidewire. Over a Benson wire, a Kumpe catheter was advanced
centrally and a central venogram was performed. Pull back venogram
was performed with the Kumpe catheter. Heparin was administered
systemically and TPA was administered via the Kumpe catheter
throughout near the entirety of the venous limb.

The venous anastomosis and majority of the venous limb was
angioplastied with a 6 mm x 4 cm Conquest balloon. An additional
access was obtained directed towards the arterial anastomoses with a
micropuncture kit after the overlying soft tissues anesthetized with
1% lidocaine. This allowed for placement of a 6-French vascular
sheath. The graft was thrombectomized with several rounds of
push-pull mechanical thrombectomy with an occlusion balloon. Flow
was restored to the graft as evidenced by restored pulsatility of
the graft and brisk blood return from the side arm of the vascular
sheath. Shuntograms were performed.

Images were reviewed and the procedure was terminated.

At this point, the procedure was terminated. All wires, catheters
and sheaths were removed from the patient. Hemostasis was achieved
at both access sites with deployment of a swizzle sutures which will
be removed at the patient's next dialysis session. Dressings were
placed. The patient tolerated the procedure without immediate
postprocedural complication.
FINDINGS: The existing left forearm AV graft is thrombosed. The venous
anastomosis and near the entirety of the venous limb was
angioplastied to 6 mm diameter. The graft was successfully
thrombectomized using mechanical and pharmacologic means as above.
Completion shuntogram demonstrates the venous limb and anastomosis
are widely patent.

Small area of extravasation was noted at recent dialysis graft
cannulation site however this was successfully treated with manual
compression following the completion of procedure.

The central venous system, arterial limb and arterial anastomosis
are widely patent.
IMPRESSION: 1. Technically successful left forearm AV graft thrombolysis.
2. Successful angioplasty of the venous anastomosis and majority of
the venous limb to 6 mm diameter. Completion shuntogram demonstrates
the venous limb and anastomosis are widely patent.
3. The arterial anastomosis and arterial limb are widely patent.
4. The central venous system is widely patent.

ACCESS:
This graft would be amenable to future percutaneous intervention as
clinically indicated.

## 2019-06-12 ENCOUNTER — Other Ambulatory Visit: Payer: Self-pay

## 2019-06-12 ENCOUNTER — Emergency Department (HOSPITAL_COMMUNITY)
Admission: EM | Admit: 2019-06-12 | Discharge: 2019-06-12 | Disposition: A | Discharge status: Home / Self Care | Payer: Medicare Other | Attending: Emergency Medicine | Admitting: Emergency Medicine

## 2019-06-12 DIAGNOSIS — Y69 Unspecified misadventure during surgical and medical care: Secondary | ICD-10-CM | POA: Insufficient documentation

## 2019-06-12 DIAGNOSIS — Z5321 Procedure and treatment not carried out due to patient leaving prior to being seen by health care provider: Secondary | ICD-10-CM | POA: Diagnosis not present

## 2019-06-12 DIAGNOSIS — T82590A Other mechanical complication of surgically created arteriovenous fistula, initial encounter: Secondary | ICD-10-CM | POA: Diagnosis not present

## 2019-06-12 NOTE — ED Triage Notes (Signed)
Patient states he was at dialysis and his fistula has possibly been clotted. The staff were unable to access so he was sent to the ER.

## 2019-06-12 NOTE — ED Notes (Signed)
Pt walked up to the registration desk and states he was leaving.

## 2019-06-13 ENCOUNTER — Emergency Department (HOSPITAL_COMMUNITY)
Admission: EM | Admit: 2019-06-13 | Discharge: 2019-06-14 | Discharge status: Against Medical Advice | Payer: Medicare Other | Attending: Emergency Medicine | Admitting: Emergency Medicine

## 2019-06-13 ENCOUNTER — Other Ambulatory Visit: Payer: Self-pay

## 2019-06-13 DIAGNOSIS — Z4931 Encounter for adequacy testing for hemodialysis: Secondary | ICD-10-CM | POA: Diagnosis present

## 2019-06-13 DIAGNOSIS — N186 End stage renal disease: Secondary | ICD-10-CM | POA: Diagnosis not present

## 2019-06-13 DIAGNOSIS — Z5321 Procedure and treatment not carried out due to patient leaving prior to being seen by health care provider: Secondary | ICD-10-CM | POA: Insufficient documentation

## 2019-06-13 DIAGNOSIS — Z992 Dependence on renal dialysis: Secondary | ICD-10-CM | POA: Diagnosis not present

## 2019-06-13 LAB — COMPREHENSIVE METABOLIC PANEL
ALT: 14 U/L (ref 0–44)
AST: 19 U/L (ref 15–41)
Albumin: 3.9 g/dL (ref 3.5–5.0)
Alkaline Phosphatase: 68 U/L (ref 38–126)
Anion gap: 12 (ref 5–15)
BUN: 91 mg/dL — ABNORMAL HIGH (ref 6–20)
CO2: 21 mmol/L — ABNORMAL LOW (ref 22–32)
Calcium: 6.9 mg/dL — ABNORMAL LOW (ref 8.9–10.3)
Chloride: 109 mmol/L (ref 98–111)
Creatinine, Ser: 10.28 mg/dL — ABNORMAL HIGH (ref 0.61–1.24)
GFR calc Af Amer: 6 mL/min — ABNORMAL LOW (ref >60)
GFR calc non Af Amer: 5 mL/min — ABNORMAL LOW (ref >60)
Glucose, Bld: 133 mg/dL — ABNORMAL HIGH (ref 70–99)
Potassium: 4.8 mmol/L (ref 3.5–5.1)
Sodium: 142 mmol/L (ref 135–145)
Total Bilirubin: 0.5 mg/dL (ref 0.3–1.2)
Total Protein: 6.6 g/dL (ref 6.5–8.1)

## 2019-06-13 LAB — CBC WITH DIFFERENTIAL/PLATELET
Abs Immature Granulocytes: 0.01 10*3/uL (ref 0.00–0.07)
Basophils Absolute: 0 10*3/uL (ref 0.0–0.1)
Basophils Relative: 1 %
Eosinophils Absolute: 0.3 10*3/uL (ref 0.0–0.5)
Eosinophils Relative: 5 %
HCT: 33.8 % — ABNORMAL LOW (ref 39.0–52.0)
Hemoglobin: 10.5 g/dL — ABNORMAL LOW (ref 13.0–17.0)
Immature Granulocytes: 0 %
Lymphocytes Relative: 31 %
Lymphs Abs: 1.7 10*3/uL (ref 0.7–4.0)
MCH: 30.3 pg (ref 26.0–34.0)
MCHC: 31.1 g/dL (ref 30.0–36.0)
MCV: 97.4 fL (ref 80.0–100.0)
Monocytes Absolute: 0.5 10*3/uL (ref 0.1–1.0)
Monocytes Relative: 9 %
Neutro Abs: 3 10*3/uL (ref 1.7–7.7)
Neutrophils Relative %: 54 %
Platelets: 68 10*3/uL — ABNORMAL LOW (ref 150–400)
RBC: 3.47 MIL/uL — ABNORMAL LOW (ref 4.22–5.81)
RDW: 14.8 % (ref 11.5–15.5)
WBC: 5.5 10*3/uL (ref 4.0–10.5)
nRBC: 0 % (ref 0.0–0.2)

## 2019-06-13 NOTE — ED Triage Notes (Signed)
Pt endorses that his fistula in the left arm has clotted off. Unable to get into see vascular due to the office being closed for 2 weeks. Has not had dialysis since last Thursday. Has no other complaints and "feels fine" VSS.

## 2019-06-13 NOTE — ED Notes (Signed)
Pt stated that he was tired of waiting and that he wanted to leave. This NT encouraged the pt to stay and be seen by one of our ED Providers however pt insisted on leaving and stated he'll come back tomorrow.

## 2019-09-11 ENCOUNTER — Telehealth: Payer: Self-pay | Admitting: Nurse Practitioner

## 2019-09-11 NOTE — Telephone Encounter (Signed)
Left message that we need to schedule a follow up - has not been seen since he missed appt. 05/14/2019

## 2019-09-20 LAB — BASIC METABOLIC PANEL
BUN: 52 — AB (ref 4–21)
CO2: 16 (ref 13–22)
Chloride: 109 — AB (ref 99–108)
Creatinine: 5 — AB (ref 0.6–1.3)
Glucose: 116
Potassium: 4.4 (ref 3.4–5.3)
Sodium: 142 (ref 137–147)

## 2019-09-20 LAB — CBC: RBC: 3.11 — AB (ref 3.87–5.11)

## 2019-09-20 LAB — CBC AND DIFFERENTIAL
HCT: 28 — AB (ref 41–53)
Hemoglobin: 9.5 — AB (ref 13.5–17.5)
Platelets: 74 — AB (ref 150–399)
WBC: 6.9

## 2019-09-20 LAB — COMPREHENSIVE METABOLIC PANEL
Albumin: 4.6 (ref 3.5–5.0)
Calcium: 7.2 — AB (ref 8.7–10.7)
GFR calc Af Amer: 14
GFR calc non Af Amer: 12
Globulin: 2.7

## 2019-09-20 LAB — HEPATIC FUNCTION PANEL
ALT: 21 (ref 10–40)
AST: 28 (ref 14–40)
Alkaline Phosphatase: 95 (ref 25–125)
Bilirubin, Total: 0.3

## 2019-10-05 LAB — COMPREHENSIVE METABOLIC PANEL
Albumin: 4.8 (ref 3.5–5.0)
Calcium: 7.7 — AB (ref 8.7–10.7)
GFR calc Af Amer: 14
GFR calc non Af Amer: 12

## 2019-10-05 LAB — BASIC METABOLIC PANEL
BUN: 56 — AB (ref 4–21)
CO2: 22 (ref 13–22)
Chloride: 108 (ref 99–108)
Creatinine: 5.1 — AB (ref 0.6–1.3)
Glucose: 207
Potassium: 4.3 (ref 3.4–5.3)
Sodium: 137 (ref 137–147)

## 2019-10-05 LAB — CBC AND DIFFERENTIAL: Hemoglobin: 9.8 — AB (ref 13.5–17.5)

## 2019-10-05 LAB — IRON,TIBC AND FERRITIN PANEL: Iron: 80

## 2019-12-20 ENCOUNTER — Encounter: Payer: Self-pay | Admitting: Nurse Practitioner

## 2019-12-21 ENCOUNTER — Telehealth: Payer: Self-pay | Admitting: *Deleted

## 2019-12-21 NOTE — Telephone Encounter (Signed)
Yes we need to follow up or does he have another PCP?  Thank you.

## 2019-12-21 NOTE — Telephone Encounter (Signed)
Monica with [employer] calls called and stated that she saw patient and needed to report abnormal findings of :  PAD Screening Left and Right Foot showed Moderate.    Patient has not been seen sin 02/09/19 and missed his last appointment.  I called patient and spoke with him regarding scheduling a follow up appointment. He stated that he needed to check with his wife's schedule first and then would call us back.

## 2020-02-26 DIAGNOSIS — I120 Hypertensive chronic kidney disease with stage 5 chronic kidney disease or end stage renal disease: Secondary | ICD-10-CM | POA: Diagnosis not present

## 2020-02-26 DIAGNOSIS — E1122 Type 2 diabetes mellitus with diabetic chronic kidney disease: Secondary | ICD-10-CM | POA: Diagnosis not present

## 2020-02-26 DIAGNOSIS — N189 Chronic kidney disease, unspecified: Secondary | ICD-10-CM | POA: Diagnosis not present

## 2020-02-26 DIAGNOSIS — I129 Hypertensive chronic kidney disease with stage 1 through stage 4 chronic kidney disease, or unspecified chronic kidney disease: Secondary | ICD-10-CM | POA: Diagnosis not present

## 2020-02-26 DIAGNOSIS — N185 Chronic kidney disease, stage 5: Secondary | ICD-10-CM | POA: Diagnosis not present

## 2020-02-26 DIAGNOSIS — D631 Anemia in chronic kidney disease: Secondary | ICD-10-CM | POA: Diagnosis not present

## 2020-02-26 DIAGNOSIS — Z940 Kidney transplant status: Secondary | ICD-10-CM | POA: Diagnosis not present

## 2020-03-31 ENCOUNTER — Ambulatory Visit: Payer: Medicare Other | Admitting: Nurse Practitioner

## 2020-04-01 ENCOUNTER — Ambulatory Visit (INDEPENDENT_AMBULATORY_CARE_PROVIDER_SITE_OTHER): Payer: Medicare Other | Admitting: Nurse Practitioner

## 2020-04-01 ENCOUNTER — Telehealth: Payer: Self-pay

## 2020-04-01 ENCOUNTER — Other Ambulatory Visit: Payer: Self-pay

## 2020-04-01 ENCOUNTER — Encounter: Payer: Self-pay | Admitting: Nurse Practitioner

## 2020-04-01 DIAGNOSIS — Z0000 Encounter for general adult medical examination without abnormal findings: Secondary | ICD-10-CM | POA: Diagnosis not present

## 2020-04-01 NOTE — Patient Instructions (Signed)
[name] , Thank you for taking time to come for your Medicare Wellness Visit. I appreciate your ongoing commitment to your health goals. Please review the following plan we discussed and let me know if I can assist you in the future.   Screening recommendations/referrals: Colonoscopy up to date Recommended yearly ophthalmology/optometry visit for glaucoma screening and checkup Recommended yearly dental visit for hygiene and checkup  Vaccinations: Influenza vaccine DUE at this time. Pneumococcal vaccine up to date Tdap vaccine RECOMMENDED Shingles vaccine to ask your transplant doctor about this medication.    Advanced directives: recommended to place on file.   Conditions/risks identified: at risk for weight loss  Next appointment: 1 year   Preventive Care 40-64 Years, Male Preventive care refers to lifestyle choices and visits with your health care provider that can promote health and wellness. What does preventive care include?  A yearly physical exam. This is also called an annual well check.  Dental exams once or twice a year.  Routine eye exams. Ask your health care provider how often you should have your eyes checked.  Personal lifestyle choices, including:  Daily care of your teeth and gums.  Regular physical activity.  Eating a healthy diet.  Avoiding tobacco and drug use.  Limiting alcohol use.  Practicing safe sex.  Taking low-dose aspirin every day starting at age 74. What happens during an annual well check? The services and screenings done by your health care provider during your annual well check will depend on your age, overall health, lifestyle risk factors, and family history of disease. Counseling  Your health care provider may ask you questions about your:  Alcohol use.  Tobacco use.  Drug use.  Emotional well-being.  Home and relationship well-being.  Sexual activity.  Eating habits.  Work and work Statistician. Screening  You  may have the following tests or measurements:  Height, weight, and BMI.  Blood pressure.  Lipid and cholesterol levels. These may be checked every 5 years, or more frequently if you are over 68 years old.  Skin check.  Lung cancer screening. You may have this screening every year starting at age 21 if you have a 30-pack-year history of smoking and currently smoke or have quit within the past 15 years.  Fecal occult blood test (FOBT) of the stool. You may have this test every year starting at age 37.  Flexible sigmoidoscopy or colonoscopy. You may have a sigmoidoscopy every 5 years or a colonoscopy every 10 years starting at age 71.  Prostate cancer screening. Recommendations will vary depending on your family history and other risks.  Hepatitis C blood test.  Hepatitis B blood test.  Sexually transmitted disease (STD) testing.  Diabetes screening. This is done by checking your blood sugar (glucose) after you have not eaten for a while (fasting). You may have this done every 1-3 years. Discuss your test results, treatment options, and if necessary, the need for more tests with your health care provider. Vaccines  Your health care provider may recommend certain vaccines, such as:  Influenza vaccine. This is recommended every year.  Tetanus, diphtheria, and acellular pertussis (Tdap, Td) vaccine. You may need a Td booster every 10 years.  Zoster vaccine. You may need this after age 8.  Pneumococcal 13-valent conjugate (PCV13) vaccine. You may need this if you have certain conditions and have not been vaccinated.  Pneumococcal polysaccharide (PPSV23) vaccine. You may need one or two doses if you smoke cigarettes or if you have certain conditions.  Talk to your health care provider about which screenings and vaccines you need and how often you need them. This information is not intended to replace advice given to you by your health care provider. Make sure you discuss any questions  you have with your health care provider. Document Released: 08/29/2015 Document Revised: 04/21/2016 Document Reviewed: 06/03/2015 Elsevier Interactive Patient Education  2017 [place] Prevention in the Home Falls can cause injuries. They can happen to people of all ages. There are many things you can do to make your home safe and to help prevent falls. What can I do on the outside of my home?  Regularly fix the edges of walkways and driveways and fix any cracks.  Remove anything that might make you trip as you walk through a door, such as a raised step or threshold.  Trim any bushes or trees on the path to your home.  Use bright outdoor lighting.  Clear any walking paths of anything that might make someone trip, such as rocks or tools.  Regularly check to see if handrails are loose or broken. Make sure that both sides of any steps have handrails.  Any raised decks and porches should have guardrails on the edges.  Have any leaves, snow, or ice cleared regularly.  Use sand or salt on walking paths during winter.  Clean up any spills in your garage right away. This includes oil or grease spills. What can I do in the bathroom?  Use night lights.  Install grab bars by the toilet and in the tub and shower. Do not use towel bars as grab bars.  Use non-skid mats or decals in the tub or shower.  If you need to sit down in the shower, use a plastic, non-slip stool.  Keep the floor dry. Clean up any water that spills on the floor as soon as it happens.  Remove soap buildup in the tub or shower regularly.  Attach bath mats securely with double-sided non-slip rug tape.  Do not have throw rugs and other things on the floor that can make you trip. What can I do in the bedroom?  Use night lights.  Make sure that you have a light by your bed that is easy to reach.  Do not use any sheets or blankets that are too big for your bed. They should not hang down onto the  floor.  Have a firm chair that has side arms. You can use this for support while you get dressed.  Do not have throw rugs and other things on the floor that can make you trip. What can I do in the kitchen?  Clean up any spills right away.  Avoid walking on wet floors.  Keep items that you use a lot in easy-to-reach places.  If you need to reach something above you, use a strong step stool that has a grab bar.  Keep electrical cords out of the way.  Do not use floor polish or wax that makes floors slippery. If you must use wax, use non-skid floor wax.  Do not have throw rugs and other things on the floor that can make you trip. What can I do with my stairs?  Do not leave any items on the stairs.  Make sure that there are handrails on both sides of the stairs and use them. Fix handrails that are broken or loose. Make sure that handrails are as long as the stairways.  Check any carpeting to make sure  that it is firmly attached to the stairs. Fix any carpet that is loose or worn.  Avoid having throw rugs at the top or bottom of the stairs. If you do have throw rugs, attach them to the floor with carpet tape.  Make sure that you have a light switch at the top of the stairs and the bottom of the stairs. If you do not have them, ask someone to add them for you. What else can I do to help prevent falls?  Wear shoes that:  Do not have high heels.  Have rubber bottoms.  Are comfortable and fit you well.  Are closed at the toe. Do not wear sandals.  If you use a stepladder:  Make sure that it is fully opened. Do not climb a closed stepladder.  Make sure that both sides of the stepladder are locked into place.  Ask someone to hold it for you, if possible.  Clearly mark and make sure that you can see:  Any grab bars or handrails.  First and last steps.  Where the edge of each step is.  Use tools that help you move around (mobility aids) if they are needed. These  include:  Canes.  Walkers.  Scooters.  Crutches.  Turn on the lights when you go into a dark area. Replace any light bulbs as soon as they burn out.  Set up your furniture so you have a clear path. Avoid moving your furniture around.  If any of your floors are uneven, fix them.  If there are any pets around you, be aware of where they are.  Review your medicines with your doctor. Some medicines can make you feel dizzy. This can increase your chance of falling. Ask your doctor what other things that you can do to help prevent falls. This information is not intended to replace advice given to you by your health care provider. Make sure you discuss any questions you have with your health care provider. Document Released: 05/29/2009 Document Revised: 01/08/2016 Document Reviewed: 09/06/2014 Elsevier Interactive Patient Education  2017 [employer].

## 2020-04-01 NOTE — Progress Notes (Signed)
Subjective:   [name] is a 49 y.o. male who presents for Medicare Annual/Subsequent preventive examination.  Review of Systems     Cardiac Risk Factors include: male gender;diabetes mellitus;hypertension     Objective:    There were no vitals filed for this visit. There is no height or weight on file to calculate BMI.  Advanced Directives 04/01/2020 06/12/2019 01/29/2019 01/29/2019 12/24/2018 08/01/2018 02/06/2018  Does Patient Have a Medical Advance Directive? Yes No Yes No Yes No No  Type of Paramedic of [place];Living will - Living will - [place];Living will - -  Does patient want to make changes to medical advance directive? No - Patient declined - No - Patient declined - Yes (Inpatient - patient defers changing a medical advance directive and declines information at this time) - -  Copy of [place] in Chart? No - copy requested - - - No - copy requested - -  Would patient like information on creating a medical advance directive? - No - Patient declined No - Patient declined - - - -  Pre-existing out of facility DNR order (yellow form or pink MOST form) - - - - - - -    Current Medications (verified) Outpatient Encounter Medications as of 04/01/2020  Medication Sig  . Accu-Chek FastClix Lancets MISC 1 each by In Vitro route 4 (four) times daily. DX:E11.9  . aspirin EC 81 MG tablet Take 81 mg by mouth daily.  Marland Kitchen atorvastatin (LIPITOR) 20 MG tablet Take one tablet by mouth at bedtime for cholesterol. NEEDS AN APPOINTMENT BEFORE ANYMORE FUTURE REFILLS.  Marland Kitchen b complex-vitamin c-folic acid (NEPHRO-VITE) 0.8 MG TABS tablet Take 1 tablet by mouth daily.  . calcium carbonate (TUMS SMOOTHIES) 750 MG chewable tablet Chew by mouth.  . dapsone 25 MG tablet Take 50 mg by mouth daily.  . diphenoxylate-atropine (LOMOTIL) 2.5-0.025 MG/5ML liquid Take by mouth.  . folic acid (FOLVITE) 1 MG tablet Take 1 mg by mouth daily.   Marland Kitchen  glucose blood (ACCU-CHEK AVIVA PLUS) test strip 1 each by Other route 4 (four) times daily. DX:E11.9  . insulin aspart (NOVOLOG FLEXPEN) 100 UNIT/ML FlexPen Inject 0-15 Units into the skin 3 (three) times daily with meals  . Insulin Pen Needle (PEN NEEDLES 3/16") 31G X 5 MM MISC Use as directed Dx:E11.22  . omeprazole (PRILOSEC) 20 MG capsule Take 1 capsule (20 mg total) by mouth daily.  . sitaGLIPtin (JANUVIA) 25 MG tablet Take by mouth.  . sodium bicarbonate 650 MG tablet Take 650 mg by mouth 2 (two) times daily.  . tacrolimus (PROGRAF) 1 MG capsule Take 3 capsules (3 mg total) by mouth 2 (two) times daily.  . [DISCONTINUED] aspirin 81 MG EC tablet Take by mouth.  . [DISCONTINUED] atorvastatin (LIPITOR) 20 MG tablet Take 1 tablet by mouth daily.  . [DISCONTINUED] calcium carbonate (OS-CAL - DOSED IN MG OF ELEMENTAL CALCIUM) 1250 (500 Ca) MG tablet Take by mouth.  . [DISCONTINUED] carvedilol (COREG) 6.25 MG tablet Take by mouth.  . [DISCONTINUED] colestipol (COLESTID) 1 g tablet Take by mouth.  . [DISCONTINUED] dapsone 25 MG tablet Take 2 tablets by mouth daily.  . [DISCONTINUED] folic acid (FOLVITE) 1 MG tablet Take 1 tablet by mouth daily.  . [DISCONTINUED] glipiZIDE (GLUCOTROL XL) 5 MG 24 hr tablet Take by mouth.  . [DISCONTINUED] midodrine (PROAMATINE) 5 MG tablet Take by mouth.  . [DISCONTINUED] multivitamin (RENA-VIT) TABS tablet Take by mouth.  . [DISCONTINUED] omeprazole (  PRILOSEC) 20 MG capsule Take 1 tablet by mouth daily.  . [DISCONTINUED] sodium bicarbonate 650 MG tablet Take 1 tablet by mouth 2 (two) times daily.  . [DISCONTINUED] tacrolimus (PROGRAF) 1 MG capsule Take by mouth.  . colestipol (COLESTID) 1 g tablet Take 2 tablets (2 g total) by mouth 2 (two) times daily for 7 days.  . [DISCONTINUED] multivitamin (RENA-VIT) TABS tablet Take 1 tablet by mouth daily.   No facility-administered encounter medications on file as of 04/01/2020.    Allergies (verified) Lactose  intolerance (gi), Other, Pollen extract, and Tape   History: Past Medical History:  Diagnosis Date  . Anemia   . CKD (chronic kidney disease), stage III   . Diabetes mellitus 12/2008   A1C 8.3 12/23/08, 24hr urine protein 2793 mg/day, SPEP neg, proliferative BDR s/p photocoagulation 08/2009 done at [facility] and f/u by DR Groat  . ESRD (end stage renal disease) on dialysis ([facility])    1.Initiated HD via right per cate 12/30/2008 (MWF schedule at [facility]) 2.Left upper extremity A-V fistula by Dr Oneida Alar 12/30/2008 3.Aemia: Received Venofer 12/30/08 Ferritin 179 % sat 12 12/23/08, 4. Secondary Hyperparathyroidism- PTH 188, P 5.4, Ca 9.2 12/2008  . Hemodialysis-associated hypotension    coreg d/c'd  . Herniated lumbar disc without myelopathy   . History of blood transfusion   . Hyperparathyroidism due to renal insufficiency ([place])   . Hypertension    resloved after initating HD, now with post HD hypotension  . Hypocalcemia   . Hypomagnesemia   . 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 Non-ischemic cardiomyopathy ([place])    EF 20-25% 12/23/2008>>EF 40-45% 03/2009>>EF ??? 7/?/2011  . Occult blood positive stool   . Psoriasis    scalp  . Thrombocytopenia ([place])   . Vitamin B 12 deficiency    Past Surgical History:  Procedure Laterality Date  . AV FISTULA PLACEMENT     left  . AV FISTULA PLACEMENT  09/12/2012   Procedure: INSERTION OF ARTERIOVENOUS (AV) GORE-TEX GRAFT ARM;  Surgeon: Elam Dutch, MD;  Location: [place];  Service: Vascular;  Laterality: Left;  KIDNEY TRANSPLANT FAILED  . BIOPSY  02/01/2019   Procedure: BIOPSY;  Surgeon: Ronnette Juniper, MD;  Location: [place];  Service: Gastroenterology;;  . CATARACT EXTRACTION, BILATERAL    . COLONOSCOPY WITH PROPOFOL N/A 02/01/2019   Procedure: COLONOSCOPY WITH PROPOFOL;  Surgeon: Ronnette Juniper, MD;  Location: [place];  Service: Gastroenterology;  Laterality: N/A;  . EYE SURGERY    .  INSERTION OF DIALYSIS CATHETER  07/14/2012   Procedure: INSERTION OF DIALYSIS CATHETER;  Surgeon: Mal Misty, MD;  Location: [place];  Service: Vascular;  Laterality: Right;  Ultrasound Guided Insertion of Internal Jugular Dialysis Catheter  . IR THROMBECTOMY AV FISTULA W/THROMBOLYSIS/PTA INC/SHUNT/IMG LEFT Left 12/28/2018  . IR US GUIDE VASC ACCESS LEFT  12/28/2018  . 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 KIDNEY TRANSPLANT  12/2013   second kidney transplant  . lumbar disc sugery   10/17/2017  . LUMBAR DISC SURGERY    . NEPHRECTOMY TRANSPLANTED ORGAN  08/11/12  . PHOTOCOAGULATION     for diabetic retinopathy 08/1999  . SHUNTOGRAM N/A 07/11/2012   Procedure: Earney Mallet;  Surgeon: Serafina Mitchell, MD;  Location: [facility] CATH LAB;  Service: Cardiovascular;  Laterality: N/A;   Family History  Problem Relation Age of Onset  . Diabetes Father   .  Kidney disease Father   . Other Father        amputation  . Hypertension Mother   . Dementia Mother   . Diabetes Sister   . Hypertension Sister   . Diabetes Sister   . Kidney disease Sister   . Diabetes Sister   . Hypertension Brother   . Diabetes Brother   . CAD Neg Hx   . Sudden Cardiac Death Neg Hx   . Congestive Heart Failure Neg Hx   . Colon cancer Neg Hx   . Rectal cancer Neg Hx   . Esophageal cancer Neg Hx   . Liver cancer Neg Hx    Social History   Socioeconomic History  . Marital status: Married    Spouse name: Not on file  . Number of children: 2  . Years of education: Not on file  . Highest education level: Not on file  Occupational History  . Occupation: disabled  Tobacco Use  . Smoking status: Never Smoker  . Smokeless tobacco: Never Used  Vaping Use  . Vaping Use: Never used  Substance and Sexual Activity  . Alcohol use: No    Alcohol/week: 0.0 standard drinks  . Drug use: No  . Sexual activity: Not on file  Other Topics Concern  . Not on file  Social History Narrative   Social  History      Diet?       Do you drink/eat things with caffeine? yes      Marital status?            married                        What year were you married?       Do you live in a house, apartment, assisted living, condo, trailer, etc.? apartment      Is it one or more stories? one      How many persons live in your home? 2      Do you have any pets in your home? (please list) no      Highest level of education completed?       Current or past profession: bus owner      Do you exercise?            yes                          Type & how often? Walking daily      Advanced Directives      Do you have a living will? no      Do you have a DNR form?            no                      If not, do you want to discuss one?      Do you have signed POA/HPOA for forms? no      Functional Status      Do you have difficulty bathing or dressing yourself? yes      Do you have difficulty preparing food or eating? no      Do you have difficulty managing your medications? yes      Do you have difficulty managing your finances? no      Do you have difficulty affording your medications? yes   Social Determinants of Radio broadcast assistant  Strain:   . Difficulty of Paying Living Expenses:   Food Insecurity:   . Worried About Charity fundraiser in the Last Year:   . Arboriculturist in the Last Year:   Transportation Needs:   . Film/video editor (Medical):   Marland Kitchen Lack of Transportation (Non-Medical):   Physical Activity:   . Days of Exercise per Week:   . Minutes of Exercise per Session:   Stress:   . Feeling of Stress :   Social Connections:   . Frequency of Communication with Friends and Family:   . Frequency of Social Gatherings with Friends and Family:   . Attends Religious Services:   . Active Member of Clubs or Organizations:   . Attends Archivist Meetings:   Marland Kitchen Marital Status:     Tobacco Counseling Counseling given: Not Answered   Clinical  Intake:  Pre-visit preparation completed: Yes  Pain : No/denies pain     BMI - recorded: 23 Nutritional Status: BMI of 19-24  Normal Nutritional Risks: None Diabetes: Yes  How often do you need to have someone help you when you read instructions, pamphlets, or other written materials from your doctor or pharmacy?: 1 - Never  Diabetic?yes         Activities of Daily Living In your present state of health, do you have any difficulty performing the following activities: 04/01/2020  Hearing? N  Vision? N  Difficulty concentrating or making decisions? N  Walking or climbing stairs? N  Dressing or bathing? N  Doing errands, shopping? N  Preparing Food and eating ? N  Using the Toilet? N  In the past six months, have you accidently leaked urine? N  Do you have problems with loss of bowel control? N  Managing your Medications? N  Managing your Finances? N  Housekeeping or managing your Housekeeping? N  Some recent data might be hidden    Patient Care Team: [name], NP as PCP - General (Geriatric Medicine) [name], MD as Referring Physician (Ophthalmology) [name], MD as Consulting Physician (Cardiology) [name], MD as Consulting Physician (Nephrology) [name], MD as Referring Physician (Surgery)  Indicate any recent Medical Services you may have received from other than [facility] providers in the past year (date may be approximate).     Assessment:   This is a routine wellness examination for [name].  Hearing/Vision screen  Hearing Screening   125Hz  250Hz  500Hz  1000Hz  2000Hz  3000Hz  4000Hz  6000Hz  8000Hz   Right ear:           Left ear:           Comments: Patient has no problems with hearing  Vision Screening Comments: Patient uses reading glasses. Patient has had recent eye exam within past year.[facility]  Dietary issues and exercise activities discussed: Current Exercise Habits: Home exercise routine, Type of  exercise: walking, Time (Minutes): 45  Goals   None    Depression Screen PHQ 2/9 Scores 04/01/2020 01/27/2018 12/20/2017 01/29/2015 01/16/2015 01/08/2015 01/01/2015  PHQ - 2 Score 0 0 0 0 0 0 0    Fall Risk Fall Risk  04/01/2020 02/09/2019 02/06/2018 01/27/2018 01/16/2018  Falls in the past year? 0 1 No Yes Yes  Number falls in past yr: 0 1 - 2 or more -  Comment - - - - -  Injury with Fall? 0 0 - No -  Risk for fall due to : - History of fall(s) - - -  Any stairs in or around the home? No  If so, are there any without handrails? No  Home free of loose throw rugs in walkways, pet beds, electrical cords, etc? Yes  Adequate lighting in your home to reduce risk of falls? Yes   ASSISTIVE DEVICES UTILIZED TO PREVENT FALLS:  Life alert? No  Use of a cane, [place] or w/c? No  Grab bars in the bathroom? Yes  Shower chair or bench in shower? No  Elevated toilet seat or a handicapped toilet? No   TIMED UP AND GO:  na  Cognitive Function: MMSE - Mini Mental State Exam 01/27/2018  Not completed: (No Data)        Immunizations Immunization History  Administered Date(s) Administered  . Hepatitis B, adult 03/01/2019, 05/03/2019  . Influenza,inj,Quad PF,6+ Mos 04/24/2014  . Influenza-Unspecified 05/03/2012, 06/11/2016  . Moderna SARS-COVID-2 Vaccination 02/06/2020, 03/05/2020  . Pneumococcal Conjugate-13 07/17/2019  . Pneumococcal Polysaccharide-23 12/23/2008, 04/24/2014  . Tdap 04/24/2014    TDAP status: Up to date Flu Vaccine status: Up to date Pneumococcal vaccine status: Up to date Covid-19 vaccine status: Completed vaccines  Qualifies for Shingles Vaccine? Yes   Zostavax completed No   Shingrix Completed?: No.    Education has been provided regarding the importance of this vaccine. Patient has been advised to call insurance company to determine out of pocket expense if they have not yet received this vaccine. Advised may also receive vaccine at local pharmacy or Health Dept.  Verbalized acceptance and understanding.  Screening Tests Health Maintenance  Topic Date Due  . OPHTHALMOLOGY EXAM  11/06/2015  . FOOT EXAM  11/11/2015  . URINE MICROALBUMIN  06/11/2017  . HEMOGLOBIN A1C  03/27/2019  . LIPID PANEL  12/25/2019  . INFLUENZA VACCINE  03/16/2020  . TETANUS/TDAP  04/24/2024  . COLONOSCOPY  01/31/2029  . PNEUMOCOCCAL POLYSACCHARIDE VACCINE AGE 51-64 HIGH RISK  Completed  . COVID-19 Vaccine  Completed  . Hepatitis C Screening  Completed  . HIV Screening  Completed    Health Maintenance  Health Maintenance Due  Topic Date Due  . OPHTHALMOLOGY EXAM  11/06/2015  . FOOT EXAM  11/11/2015  . URINE MICROALBUMIN  06/11/2017  . HEMOGLOBIN A1C  03/27/2019  . LIPID PANEL  12/25/2019  . INFLUENZA VACCINE  03/16/2020    Colorectal cancer screening: Completed 2021. Repeat every 5 years  Lung Cancer Screening: (Low Dose CT Chest recommended if Age 23-80 years, 30 pack-year currently smoking OR have quit w/in 15years.) does not qualify.   Lung Cancer Screening Referral: na  Additional Screening:  Hepatitis C Screening: does qualify; Completed 01/29/2019   Vision Screening: Recommended annual ophthalmology exams for early detection of glaucoma and other disorders of the eye. Is the patient up to date with their annual eye exam?  Yes  Who is the provider or what is the name of the office in which the patient attends annual eye exams? [facility] If pt is not established with a provider, would they like to be referred to a provider to establish care? No .   Dental Screening: Recommended annual dental exams for proper oral hygiene  Community Resource Referral / Chronic Care Management: CRR required this visit?  No   CCM required this visit?  No      Plan:     I have personally reviewed and noted the following in the patient's chart:   . Medical and social history . Use of alcohol, tobacco or illicit drugs  . Current medications  and  supplements . Functional ability and status . Nutritional status . Physical activity . Advanced directives . List of other physicians . Hospitalizations, surgeries, and ER visits in previous 12 months . Vitals . Screenings to include cognitive, depression, and falls . Referrals and appointments  In addition, I have reviewed and discussed with patient certain preventive protocols, quality metrics, and best practice recommendations. A written personalized care plan for preventive services as well as general preventive health recommendations were provided to patient.     [name], NP   04/01/2020

## 2020-04-01 NOTE — Progress Notes (Signed)
This service is provided via telemedicine  No vital signs collected/recorded due to the encounter was a telemedicine visit.   Location of patient (ex: home, work):  Home  Patient consents to a telephone visit:  Yes, see encounter dated 04/01/2020  Location of the provider (ex: office, home):  Home  Name of any referring provider:  N/A  Names of all persons participating in the telemedicine service and their role in the encounter:  Sherrie Mustache, Nurse Practitioner, Carroll Kinds, CMA, and patient.   Time spent on call:  9 minutes with medical assitant

## 2020-04-01 NOTE — Telephone Encounter (Signed)
[name], asato are scheduled for a virtual visit with your provider today.    Just as we do with appointments in the office, we must obtain your consent to participate.  Your consent will be active for this visit and any virtual visit you may have with one of our providers in the next 365 days.    If you have a MyChart account, I can also send a copy of this consent to you electronically.  All virtual visits are billed to your insurance company just like a traditional visit in the office.  As this is a virtual visit, video technology does not allow for your provider to perform a traditional examination.  This may limit your provider's ability to fully assess your condition.  If your provider identifies any concerns that need to be evaluated in person or the need to arrange testing such as labs, EKG, etc, we will make arrangements to do so.    Although advances in technology are sophisticated, we cannot ensure that it will always work on either your end or our end.  If the connection with a video visit is poor, we may have to switch to a telephone visit.  With either a video or telephone visit, we are not always able to ensure that we have a secure connection.   I need to obtain your verbal consent now.   Are you willing to proceed with your visit today?   [name] has provided verbal consent on 04/01/2020 for a virtual visit (video or telephone).   Carroll Kinds, CMA 04/01/2020  9:15 AM

## 2020-04-02 ENCOUNTER — Encounter: Payer: Self-pay | Admitting: Nurse Practitioner

## 2020-04-02 ENCOUNTER — Ambulatory Visit (INDEPENDENT_AMBULATORY_CARE_PROVIDER_SITE_OTHER): Payer: Medicare Other | Admitting: Nurse Practitioner

## 2020-04-02 ENCOUNTER — Other Ambulatory Visit: Payer: Self-pay

## 2020-04-02 VITALS — BP 154/96 | HR 80 | Temp 96.8°F | Ht 65.0 in | Wt 155.6 lb

## 2020-04-02 DIAGNOSIS — Z794 Long term (current) use of insulin: Secondary | ICD-10-CM

## 2020-04-02 DIAGNOSIS — N186 End stage renal disease: Secondary | ICD-10-CM

## 2020-04-02 DIAGNOSIS — E785 Hyperlipidemia, unspecified: Secondary | ICD-10-CM

## 2020-04-02 DIAGNOSIS — D631 Anemia in chronic kidney disease: Secondary | ICD-10-CM | POA: Diagnosis not present

## 2020-04-02 DIAGNOSIS — D649 Anemia, unspecified: Secondary | ICD-10-CM

## 2020-04-02 DIAGNOSIS — Z9119 Patient's noncompliance with other medical treatment and regimen: Secondary | ICD-10-CM

## 2020-04-02 DIAGNOSIS — Z91199 Patient's noncompliance with other medical treatment and regimen due to unspecified reason: Secondary | ICD-10-CM

## 2020-04-02 DIAGNOSIS — N185 Chronic kidney disease, stage 5: Secondary | ICD-10-CM | POA: Diagnosis not present

## 2020-04-02 DIAGNOSIS — E1122 Type 2 diabetes mellitus with diabetic chronic kidney disease: Secondary | ICD-10-CM

## 2020-04-02 DIAGNOSIS — K219 Gastro-esophageal reflux disease without esophagitis: Secondary | ICD-10-CM

## 2020-04-02 DIAGNOSIS — I120 Hypertensive chronic kidney disease with stage 5 chronic kidney disease or end stage renal disease: Secondary | ICD-10-CM | POA: Diagnosis not present

## 2020-04-02 DIAGNOSIS — I10 Essential (primary) hypertension: Secondary | ICD-10-CM

## 2020-04-02 DIAGNOSIS — Z940 Kidney transplant status: Secondary | ICD-10-CM | POA: Diagnosis not present

## 2020-04-02 NOTE — Progress Notes (Signed)
Careteam: Patient Care Team: [name], NP as PCP - General (Geriatric Medicine) [name], MD as Referring Physician (Ophthalmology) [name], MD as Consulting Physician (Cardiology) [name], MD as Consulting Physician (Nephrology) [name], MD as Referring Physician (Surgery) Corliss Parish, MD as Consulting Physician (Nephrology)  PLACE OF SERVICE:  [place] Directive information Does Patient Have a Medical Advance Directive?: No, Does patient want to make changes to medical advance directive?: Yes (MAU/Ambulatory/Procedural Areas - Information given) (Waiting to get notarized)  Allergies  Allergen Reactions   Lactose Intolerance (Gi) Diarrhea    No whole milk!!   Other Other (See Comments)    Soy milk burns his tongue   Pollen Extract Other (See Comments)    Itching, watery eyes and sneezing.   Tape Rash    PLEASE USE COBAN WRAP Pt can't have paper tape    Chief Complaint  Patient presents with   Medical Management of Chronic Issues    1 year routine follow-up    Quality Metric Gaps    Per patient foot exam completed by endcrinologist who works at [employer] Discuss need for MALB, A1c, and Lipid (per patient already done by specialist). ROI signed by patient to have records sent to [place] from specialist.    Advanced Directive    Discuss MOST form      HPI: Patient is a 49 y.o. male for routine follow up  CKD, s/p transplant- seeing [place] at [place] [facility], saw her this morning and they have completed blood work at that office. Not on any HD at this time. Reports renal function has been stable.  He had to adjust his anti rejection medication due to increase in nausea- doing better at this time.  Reports Moshe Cipro is adjusting insulin and managing diabetes    DM- pt reports he was seeing nephrologist for management of diabetes- states he is on SSI with meals and bedtime novolog only.  Also using janvuia 25 mg daily  Reports A1c is low- reports he used to drop between 3 and 4 am but not doing this. Also seeing a diabetic counselor (unsure name)  No ongoing diarrhea. Not requiring medications at all  GERD- on prilosec for acid reflex, diet controlled.   Hyperlipidemia- on lipitor- reports this was prescribed by [facility]     Review of Systems:  Review of Systems  Constitutional: Negative for chills, fever and weight loss.  HENT: Negative for tinnitus.   Respiratory: Negative for cough, sputum production and shortness of breath.   Cardiovascular: Negative for chest pain, palpitations and leg swelling.  Gastrointestinal: Negative for abdominal pain, constipation, diarrhea and heartburn.  Genitourinary: Negative for dysuria, frequency and urgency.  Musculoskeletal: Negative for back pain, falls, joint pain and myalgias.  Skin: Negative.   Neurological: Negative for dizziness, tingling, focal weakness, weakness and headaches.  Psychiatric/Behavioral: Negative for depression and memory loss. The patient does not have insomnia.     Past Medical History:  Diagnosis Date   Anemia    CKD (chronic kidney disease), stage III    Diabetes mellitus 12/2008   A1C 8.3 12/23/08, 24hr urine protein 2793 mg/day, SPEP neg, proliferative BDR s/p photocoagulation 08/2009 done at [facility] and f/u by DR Groat   ESRD (end stage renal disease) on dialysis ([place])    1.Initiated HD via right per cate 12/30/2008 (MWF schedule at [facility]) 2.Left upper extremity A-V fistula by Dr Oneida Alar 12/30/2008 3.Aemia: Received Venofer 12/30/08 Ferritin  179 % sat 12 12/23/08, 4. Secondary Hyperparathyroidism- PTH 188, P 5.4, Ca 9.2 12/2008   Hemodialysis-associated hypotension    coreg d/c'd   Herniated lumbar disc without myelopathy    History of blood transfusion    Hyperparathyroidism due to renal insufficiency (HCC)    Hypertension    resloved after initating HD, now with post HD  hypotension   Hypocalcemia    Hypomagnesemia    Kidney transplant as cause of abnormal reaction or later complication 5053   [facility]   Non-ischemic cardiomyopathy ([place])    EF 20-25% 12/23/2008>>EF 40-45% 03/2009>>EF ??? 7/?/2011   Occult blood positive stool    Psoriasis    scalp   Thrombocytopenia (HCC)    Vitamin B 12 deficiency    Past Surgical History:  Procedure Laterality Date   AV FISTULA PLACEMENT     left   AV FISTULA PLACEMENT  09/12/2012   Procedure: INSERTION OF ARTERIOVENOUS (AV) GORE-TEX GRAFT ARM;  Surgeon: Elam Dutch, MD;  Location: [facility] OR;  Service: Vascular;  Laterality: Left;  KIDNEY TRANSPLANT FAILED   BIOPSY  02/01/2019   Procedure: BIOPSY;  Surgeon: Ronnette Juniper, MD;  Location: [facility] ENDOSCOPY;  Service: Gastroenterology;;   CATARACT EXTRACTION, BILATERAL     COLONOSCOPY WITH PROPOFOL N/A 02/01/2019   Procedure: COLONOSCOPY WITH PROPOFOL;  Surgeon: Ronnette Juniper, MD;  Location: [facility];  Service: Gastroenterology;  Laterality: N/A;   EYE SURGERY     INSERTION OF DIALYSIS CATHETER  07/14/2012   Procedure: INSERTION OF DIALYSIS CATHETER;  Surgeon: Mal Misty, MD;  Location: [facility] OR;  Service: Vascular;  Laterality: Right;  Ultrasound Guided Insertion of Internal Jugular Dialysis Catheter   IR THROMBECTOMY AV FISTULA W/THROMBOLYSIS/PTA INC/SHUNT/IMG LEFT Left 12/28/2018   IR US GUIDE VASC [place] LEFT  12/28/2018   KIDNEY TRANSPLANT  2013   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 He has never used smokeless tobacco. He  reports that he does not drink alcohol and does not use drugs.  Family History  Problem Relation Age of Onset   Diabetes Father    Kidney disease Father    Other Father        amputation   Hypertension Mother    Dementia Mother    Diabetes Sister    Hypertension Sister    Diabetes Sister    Kidney disease Sister    Diabetes Sister    Hypertension Brother    Diabetes Brother    CAD Neg Hx    Sudden Cardiac Death Neg Hx    Congestive Heart Failure Neg Hx    Colon cancer Neg Hx    Rectal cancer Neg Hx    Esophageal cancer Neg Hx    Liver cancer Neg Hx     Medications: Patient's Medications  New Prescriptions   No medications on file  Previous Medications   ACCU-CHEK FASTCLIX LANCETS MISC    1 each by In Vitro route 4 (four) times daily. DX:E11.9   ASPIRIN EC 81 MG TABLET  Take 81 mg by mouth daily.   ATORVASTATIN (LIPITOR) 20 MG TABLET    Take one tablet by mouth at bedtime for cholesterol. NEEDS AN APPOINTMENT BEFORE ANYMORE FUTURE REFILLS.   B COMPLEX-VITAMIN C-FOLIC ACID (NEPHRO-VITE) 0.8 MG TABS TABLET    Take 1 tablet by mouth daily.   CALCIUM CARBONATE (TUMS SMOOTHIES) 750 MG CHEWABLE TABLET    Chew by mouth.   DAPSONE 25 MG TABLET    Take 50 mg by mouth daily.   DIPHENOXYLATE-ATROPINE (LOMOTIL) 2.5-0.025 MG/5ML LIQUID    Take by mouth.   FOLIC ACID (FOLVITE) 1 MG TABLET    Take 1 mg by mouth daily.    GLUCOSE BLOOD (ACCU-CHEK AVIVA PLUS) TEST STRIP    1 each by Other route 4 (four) times daily. DX:E11.9   INSULIN ASPART (NOVOLOG FLEXPEN) 100 UNIT/ML FLEXPEN    Inject 0-15 Units into the skin 3 (three) times daily with meals   INSULIN PEN NEEDLE (PEN NEEDLES 3/16") 31G X 5 MM MISC    Use as directed Dx:E11.22   OMEPRAZOLE (PRILOSEC) 20 MG CAPSULE    Take 1 capsule (20 mg total) by mouth daily.   SODIUM BICARBONATE 650 MG TABLET    Take 650 mg by mouth 2 (two) times daily.   TACROLIMUS (PROGRAF) 1 MG CAPSULE    Take 3 capsules (3 mg total) by mouth 2  (two) times daily.  Modified Medications   No medications on file  Discontinued Medications   COLESTIPOL (COLESTID) 1 G TABLET    Take 2 tablets (2 g total) by mouth 2 (two) times daily for 7 days.   SITAGLIPTIN (JANUVIA) 25 MG TABLET    Take by mouth.    Physical Exam:  Vitals:   04/02/20 1416  BP: (!) 154/96  Pulse: 80  Temp: (!) 96.8 F (36 C)  TempSrc: Temporal  SpO2: 98%  Weight: 155 lb 9.6 oz (70.6 kg)  Height: 5\' 5"  (1.651 m)   Body mass index is 25.89 kg/m. Wt Readings from Last 3 Encounters:  04/02/20 155 lb 9.6 oz (70.6 kg)  06/12/19 112 lb 7 oz (51 kg)  02/09/19 113 lb (51.3 kg)    Physical Exam Constitutional:      General: He is not in acute distress.    Appearance: He is well-developed. He is not diaphoretic.  HENT:     Head: Normocephalic and atraumatic.     Mouth/Throat:     Pharynx: No oropharyngeal exudate.  Eyes:     Conjunctiva/sclera: Conjunctivae normal.     Pupils: Pupils are equal, round, and reactive to light.  Cardiovascular:     Rate and Rhythm: Normal rate and regular rhythm.     Pulses:          Dorsalis pedis pulses are 0 on the right side and 0 on the left side.       Posterior tibial pulses are 0 on the right side and 0 on the left side.     Heart sounds: Normal heart sounds.  Pulmonary:     Effort: Pulmonary effort is normal.     Breath sounds: Normal breath sounds.  Abdominal:     General: Bowel sounds are normal.     Palpations: Abdomen is soft.  Musculoskeletal:        General: No tenderness.     Cervical back: Normal range of motion and neck supple.  Feet:     Right foot:     Protective Sensation: 3 sites tested.  3 sites sensed.     Skin integrity: Skin integrity normal.     Toenail Condition: Right toenails are abnormally thick. Fungal disease present.    Left foot:     Skin integrity: Skin integrity normal.     Toenail Condition: Left toenails are abnormally thick. Fungal disease present. Skin:    General: Skin is  warm and dry.  Neurological:     Mental Status: He is alert and oriented to person, place, and time.  Psychiatric:        Mood and Affect: Mood normal.        Behavior: Behavior normal.     Labs reviewed: Basic Metabolic Panel: Recent Labs    06/13/19 1406  NA 142  K 4.8  CL 109  CO2 21*  GLUCOSE 133*  BUN 91*  CREATININE 10.28*  CALCIUM 6.9*   Liver Function Tests: Recent Labs    06/13/19 1406  AST 19  ALT 14  ALKPHOS 68  BILITOT 0.5  PROT 6.6  ALBUMIN 3.9   No results for input(s): LIPASE, AMYLASE in the last 8760 hours. No results for input(s): AMMONIA in the last 8760 hours. CBC: Recent Labs    06/13/19 1406  WBC 5.5  NEUTROABS 3.0  HGB 10.5*  HCT 33.8*  MCV 97.4  PLT 68*   Lipid Panel: No results for input(s): CHOL, HDL, LDLCALC, TRIG, CHOLHDL, LDLDIRECT in the last 8760 hours. TSH: No results for input(s): TSH in the last 8760 hours. A1C: Lab Results  Component Value Date   HGBA1C 7.2 (H) 12/25/2018     Assessment/Plan 1. Type 2 diabetes mellitus with stage 5 chronic kidney disease not on chronic dialysis, with long-term current use of insulin (HCC) -pt has not been followed with A1c, educated importance on follow up and proper control on blood sugars. He does not have readings today. Reports his blood sugars have been well controlled. Encouraged dietary compliance, routine foot care/monitoring and to keep up with diabetic eye exams through ophthalmology  - Hemoglobin A1c  2. Hyperlipidemia with target low density lipoprotein (LDL) cholesterol less than 100 mg/dL - nephrologist sent in last refill for statin however has not followed lipid. Will check this today. Educated importance on follow up, and compliance with medication and diet. - Lipid Panel - Hepatic Function Panel  3. Noncompliance Pt has not been seen since 02/09/19;, he has missed follow up and rescheduled  or unable to reach via telephone. Educated him that it is important to keep  scheduled follow up to provide proper medical care and to be seen routinely.   4. End stage renal disease ([place]) -followed by nephrology s/p transplant.   5. Anemia, unspecified type Followed by nephrology, will get updated labs, record release signed  6. Essential hypertension, benign Slightly elevated today, to monitor at home, goal bp <140/90.   7. Gastroesophageal reflux disease without esophagitis Stable on omeprazole. Also with dietary modifications.   Next appt: 4 months.  [name]. [place], [facility] [phone]

## 2020-04-02 NOTE — Patient Instructions (Signed)
°  Dear [name]:  Please be advised that in order for our practice to provide you with proper medical care, it is important that you keep your scheduled appointments, follow up on a routine basis and to be on time for these appointments.   It is our office policy that patients who have missed three (3) scheduled appointments be dismissed from our practice.  If you are unable to keep a scheduled appointment, please call at least 24 hours in advance to reschedule and avoid having this count as a missed appointment.  Thank you for your consideration  Sincerely,    [name], NP

## 2020-04-03 LAB — HEPATIC FUNCTION PANEL
AG Ratio: 1.6 (calc) (ref 1.0–2.5)
ALT: 18 U/L (ref 9–46)
AST: 22 U/L (ref 10–35)
Albumin: 4.4 g/dL (ref 3.6–5.1)
Alkaline phosphatase (APISO): 98 U/L (ref 35–144)
Bilirubin, Direct: 0.1 mg/dL (ref 0.0–0.2)
Globulin: 2.7 g/dL (calc) (ref 1.9–3.7)
Indirect Bilirubin: 0.2 mg/dL (calc) (ref 0.2–1.2)
Total Bilirubin: 0.3 mg/dL (ref 0.2–1.2)
Total Protein: 7.1 g/dL (ref 6.1–8.1)

## 2020-04-03 LAB — HEMOGLOBIN A1C
Hgb A1c MFr Bld: 7.6 % of total Hgb — ABNORMAL HIGH (ref <5.7)
Mean Plasma Glucose: 171 (calc)
eAG (mmol/L): 9.5 (calc)

## 2020-04-03 LAB — LIPID PANEL
Cholesterol: 178 mg/dL (ref <200)
HDL: 29 mg/dL — ABNORMAL LOW (ref >=40)
LDL Cholesterol (Calc): 112 mg/dL (calc) — ABNORMAL HIGH
Non-HDL Cholesterol (Calc): 149 mg/dL (calc) — ABNORMAL HIGH (ref <130)
Total CHOL/HDL Ratio: 6.1 (calc) — ABNORMAL HIGH (ref <5.0)
Triglycerides: 241 mg/dL — ABNORMAL HIGH (ref <150)

## 2020-04-08 ENCOUNTER — Other Ambulatory Visit: Payer: Self-pay

## 2020-04-08 DIAGNOSIS — E785 Hyperlipidemia, unspecified: Secondary | ICD-10-CM

## 2020-04-08 DIAGNOSIS — D649 Anemia, unspecified: Secondary | ICD-10-CM

## 2020-04-08 DIAGNOSIS — N186 End stage renal disease: Secondary | ICD-10-CM

## 2020-04-08 DIAGNOSIS — I10 Essential (primary) hypertension: Secondary | ICD-10-CM

## 2020-04-08 DIAGNOSIS — N185 Chronic kidney disease, stage 5: Secondary | ICD-10-CM

## 2020-04-08 MED ORDER — ATORVASTATIN CALCIUM 40 MG PO TABS
40.0000 mg | ORAL_TABLET | Freq: Every day | ORAL | 1 refills | Status: DC
Start: 2020-04-08 — End: 2023-06-09

## 2020-04-11 ENCOUNTER — Telehealth: Payer: Self-pay

## 2020-04-11 NOTE — Telephone Encounter (Signed)
Noted thank you

## 2020-04-11 NOTE — Telephone Encounter (Signed)
Incoming mail from [facility] indicated patient has a significant amount of decreased blood flow in the right foot.  Per [name], NP call patient  1.) Ask if any leg pain? No 2.) Ask if patient in agreement with a referral to vein and vascular for further evaluation? Not at this time  Possible risk associated with decrease blood flow include: Non-healing wounds, infections, and worse case scenario amputations.   Patient aware of the above and still refuses referral to Vein and Vascular for further evaluation

## 2020-07-07 DIAGNOSIS — E1122 Type 2 diabetes mellitus with diabetic chronic kidney disease: Secondary | ICD-10-CM | POA: Diagnosis not present

## 2020-07-07 DIAGNOSIS — Z940 Kidney transplant status: Secondary | ICD-10-CM | POA: Diagnosis not present

## 2020-07-07 DIAGNOSIS — Z23 Encounter for immunization: Secondary | ICD-10-CM | POA: Diagnosis not present

## 2020-07-07 DIAGNOSIS — N185 Chronic kidney disease, stage 5: Secondary | ICD-10-CM | POA: Diagnosis not present

## 2020-07-07 DIAGNOSIS — I120 Hypertensive chronic kidney disease with stage 5 chronic kidney disease or end stage renal disease: Secondary | ICD-10-CM | POA: Diagnosis not present

## 2020-07-07 LAB — COMPREHENSIVE METABOLIC PANEL WITH GFR
Albumin: 4.4 (ref 3.5–5.0)
Calcium: 7.2 — AB (ref 8.7–10.7)
GFR calc Af Amer: 15
GFR calc non Af Amer: 13
Globulin: 2.5

## 2020-07-07 LAB — HEPATIC FUNCTION PANEL
ALT: 12 (ref 10–40)
AST: 16 (ref 14–40)
Alkaline Phosphatase: 95 (ref 25–125)
Bilirubin, Total: 0.3

## 2020-07-07 LAB — CBC AND DIFFERENTIAL
HCT: 30 — AB (ref 41–53)
Hemoglobin: 9.8 — AB (ref 13.5–17.5)
Neutrophils Absolute: 62
Platelets: 77 — AB (ref 150–399)
WBC: 10.5

## 2020-07-07 LAB — CBC: RBC: 3.37 — AB (ref 3.87–5.11)

## 2020-07-07 LAB — BASIC METABOLIC PANEL
BUN: 47 — AB (ref 4–21)
CO2: 20 (ref 13–22)
Chloride: 108 (ref 99–108)
Creatinine: 4.8 — AB (ref 0.6–1.3)
Glucose: 201
Potassium: 4.2 (ref 3.4–5.3)
Sodium: 139 (ref 137–147)

## 2020-08-01 ENCOUNTER — Ambulatory Visit: Payer: Medicare Other | Admitting: Nurse Practitioner

## 2020-08-04 ENCOUNTER — Encounter: Payer: Self-pay | Admitting: Nurse Practitioner

## 2020-08-04 ENCOUNTER — Ambulatory Visit: Payer: Medicare Other | Admitting: Nurse Practitioner

## 2020-10-27 DIAGNOSIS — Z940 Kidney transplant status: Secondary | ICD-10-CM | POA: Diagnosis not present

## 2020-10-27 DIAGNOSIS — N189 Chronic kidney disease, unspecified: Secondary | ICD-10-CM | POA: Diagnosis not present

## 2020-10-27 DIAGNOSIS — N185 Chronic kidney disease, stage 5: Secondary | ICD-10-CM | POA: Diagnosis not present

## 2020-10-27 DIAGNOSIS — E1122 Type 2 diabetes mellitus with diabetic chronic kidney disease: Secondary | ICD-10-CM | POA: Diagnosis not present

## 2020-10-27 DIAGNOSIS — D696 Thrombocytopenia, unspecified: Secondary | ICD-10-CM | POA: Diagnosis not present

## 2020-10-27 DIAGNOSIS — N2581 Secondary hyperparathyroidism of renal origin: Secondary | ICD-10-CM | POA: Diagnosis not present

## 2020-10-27 DIAGNOSIS — I120 Hypertensive chronic kidney disease with stage 5 chronic kidney disease or end stage renal disease: Secondary | ICD-10-CM | POA: Diagnosis not present

## 2020-10-27 DIAGNOSIS — D631 Anemia in chronic kidney disease: Secondary | ICD-10-CM | POA: Diagnosis not present

## 2021-07-03 DIAGNOSIS — I770 Arteriovenous fistula, acquired: Secondary | ICD-10-CM | POA: Diagnosis not present

## 2021-07-03 DIAGNOSIS — I132 Hypertensive heart and chronic kidney disease with heart failure and with stage 5 chronic kidney disease, or end stage renal disease: Secondary | ICD-10-CM | POA: Diagnosis not present

## 2021-07-03 DIAGNOSIS — E559 Vitamin D deficiency, unspecified: Secondary | ICD-10-CM | POA: Diagnosis not present

## 2021-07-03 DIAGNOSIS — E1122 Type 2 diabetes mellitus with diabetic chronic kidney disease: Secondary | ICD-10-CM | POA: Diagnosis not present

## 2021-07-03 DIAGNOSIS — D649 Anemia, unspecified: Secondary | ICD-10-CM | POA: Diagnosis not present

## 2021-07-03 DIAGNOSIS — D84821 Immunodeficiency due to drugs: Secondary | ICD-10-CM | POA: Diagnosis not present

## 2021-07-03 DIAGNOSIS — E113519 Type 2 diabetes mellitus with proliferative diabetic retinopathy with macular edema, unspecified eye: Secondary | ICD-10-CM | POA: Diagnosis not present

## 2021-07-03 DIAGNOSIS — Z79899 Other long term (current) drug therapy: Secondary | ICD-10-CM | POA: Diagnosis not present

## 2021-07-03 DIAGNOSIS — I10 Essential (primary) hypertension: Secondary | ICD-10-CM | POA: Diagnosis not present

## 2021-07-03 DIAGNOSIS — Z1159 Encounter for screening for other viral diseases: Secondary | ICD-10-CM | POA: Diagnosis not present

## 2021-07-03 DIAGNOSIS — E1169 Type 2 diabetes mellitus with other specified complication: Secondary | ICD-10-CM | POA: Diagnosis not present

## 2021-07-15 DIAGNOSIS — D631 Anemia in chronic kidney disease: Secondary | ICD-10-CM | POA: Diagnosis not present

## 2021-07-15 DIAGNOSIS — N185 Chronic kidney disease, stage 5: Secondary | ICD-10-CM | POA: Diagnosis not present

## 2021-07-15 DIAGNOSIS — N2581 Secondary hyperparathyroidism of renal origin: Secondary | ICD-10-CM | POA: Diagnosis not present

## 2021-07-15 DIAGNOSIS — I120 Hypertensive chronic kidney disease with stage 5 chronic kidney disease or end stage renal disease: Secondary | ICD-10-CM | POA: Diagnosis not present

## 2021-07-15 DIAGNOSIS — Z940 Kidney transplant status: Secondary | ICD-10-CM | POA: Diagnosis not present

## 2021-07-15 DIAGNOSIS — D696 Thrombocytopenia, unspecified: Secondary | ICD-10-CM | POA: Diagnosis not present

## 2021-07-15 DIAGNOSIS — E1122 Type 2 diabetes mellitus with diabetic chronic kidney disease: Secondary | ICD-10-CM | POA: Diagnosis not present

## 2021-07-23 ENCOUNTER — Telehealth: Payer: Self-pay | Admitting: Physician Assistant

## 2021-07-23 NOTE — Telephone Encounter (Signed)
Scheduled appt per 12/5 referral. Pt is aware of appt date and time.

## 2021-08-03 NOTE — Progress Notes (Deleted)
[place] [phone]   [phone]  CONSULT NOTE  REFERRING PHYSICIAN: Dr. Louie Boston   REASON FOR CONSULTATION:  Thrombocytopenia  HPI [name] is a 50 y.o. male chronic kidney disease stage V status post dialysis from 01/2019-07/2019, hypertension, diabetes, cardiomyopathy, GERD, B12 deficiency, thrombocytopenia is referred to the clinic for thrombocytopenia.  The patient is followed by nephrologist Dr. Moshe Cipro.  The patient had a follow-up appointment with their office on 07/20/2021.  The patient has some chronic anemia secondary to renal insufficiency.  He also was noted to have thrombocytopenia and was referred to the clinic for further evaluation regarding these findings.  He has some chronic anemia due to his chronic kidney disease. He is on Tacrolimus which can cause thrombocytopenia. He is also on dapsone which other blood dyscrasias? But has indication for immune thrombocytopenia.   Per chart review, the oldest records I have date back to 2010.  The patient had evidence of thrombocytopenia at that time with a platelet count of 87K.  He also had some intermittent anemia as well.  The lowest platelet count I have is 63,000.  The patient denies any history of any liver disease or cirrhosis.  Fatigue?  He denies any herbal supplement use.  Denies any viral infection such as HIV or hepatitis.  Denies any fever, chills, night sweats, or unexplained weight loss.  Abdominal pain?  He denies any early satiety or bloating.  Denies any history of autoimmune disorders.  Denies any history of bariatric surgery.  Denies any vitamin deficiencies.  Denies any abnormal bleeding or bruising except for?  Surgery and bleeding complications?  Alcohol abuse? 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 Secondary Hyperparathyroidism- PTH 188, P 5.4, Ca 9.2 12/2008   Has poorly balanced diet    Hemodialysis-associated hypotension    coreg d/c'd   Herniated lumbar disc without myelopathy    History of blood transfusion    Hyperlipemia    Hyperparathyroidism due to renal insufficiency (HCC)    Hypertension    resloved after initating HD, now with post HD hypotension   Hypocalcemia    Hypomagnesemia    Kidney transplant as cause of abnormal reaction or later complication 0539   [facility]   Non-ischemic cardiomyopathy ([place])    EF 20-25% 12/23/2008>>EF 40-45% 03/2009>>EF ??? 7/?/2011   Obese    Occult blood positive stool    Psoriasis    scalp   Thrombocytopenia (HCC)    Vitamin B 12 deficiency     Past Surgical History:  Procedure Laterality Date   AV FISTULA PLACEMENT     left   AV FISTULA PLACEMENT  09/12/2012   Procedure: INSERTION OF ARTERIOVENOUS (AV) GORE-TEX GRAFT ARM;  Surgeon: Elam Dutch, MD;  Location: [place];  Service: Vascular;  Laterality: Left;  KIDNEY TRANSPLANT FAILED   BIOPSY  02/01/2019   Procedure: BIOPSY;  Surgeon: Ronnette Juniper, MD;  Location: [facility]  ENDOSCOPY;  Service: Gastroenterology;;   CATARACT EXTRACTION, BILATERAL     COLONOSCOPY WITH PROPOFOL N/A 02/01/2019   Procedure: COLONOSCOPY WITH PROPOFOL;  Surgeon: Ronnette Juniper, MD;  Location: [place];  Service: Gastroenterology;  Laterality: N/A;   EYE SURGERY     INSERTION OF DIALYSIS CATHETER  07/14/2012   Procedure: INSERTION OF DIALYSIS CATHETER;  Surgeon: Mal Misty, MD;  [facility] OR;  Service: Vascular;  Laterality: Right;  Ultrasound  Guided Insertion of Internal Jugular Dialysis Catheter   IR THROMBECTOMY AV FISTULA W/THROMBOLYSIS/PTA INC/SHUNT/IMG LEFT Left 12/28/2018   IR US GUIDE VASC [place] LEFT  12/28/2018   KIDNEY TRANSPLANT  2013   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   KIDNEY TRANSPLANT  12/2013   second kidney transplant   lumbar disc sugery   10/17/2017   LUMBAR [place] TRANSPLANTED ORGAN  08/11/12   PHOTOCOAGULATION     for diabetic retinopathy 08/1999   SHUNTOGRAM N/A 07/11/2012   Procedure: Earney Mallet;  Surgeon: Serafina Mitchell, MD;  Location: [facility] CATH LAB;  Service: Cardiovascular;  Laterality: N/A;    Family History  Problem Relation Age of Onset   Diabetes Father    Kidney disease Father    Other Father        amputation   Hypertension Mother    Dementia Mother    Diabetes Sister    Hypertension Sister    Diabetes Sister    Kidney disease Sister    Diabetes Sister    Hypertension Brother    Diabetes Brother    CAD Neg Hx    Sudden Cardiac Death Neg Hx    Congestive Heart Failure Neg Hx    Colon cancer Neg Hx    Rectal cancer Neg Hx    Esophageal cancer Neg Hx    Liver cancer Neg Hx     Social History Social History   Tobacco Use   Smoking status: Never   Smokeless tobacco: Never  Vaping Use   Vaping Use: Never used  Substance Use Topics   Alcohol use: No    Alcohol/week: 0.0 standard drinks   Drug use: No    Allergies  Allergen Reactions   Lactose Intolerance (Gi) Diarrhea    No whole milk!!   Other Other (See Comments)    Soy milk burns his tongue   Pollen Extract Other (See Comments)    Itching, watery eyes and sneezing.   Tape Rash    PLEASE USE COBAN WRAP Pt can't have paper tape    Current Outpatient Medications  Medication Sig Dispense Refill   Accu-Chek FastClix Lancets MISC 1 each by In Vitro route 4 (four) times daily. DX:E11.9 100 each 6   aspirin EC 81 MG tablet Take 81 mg by mouth daily.     atorvastatin (LIPITOR) 40 MG tablet  Take 1 tablet (40 mg total) by mouth daily. 90 tablet 1   calcium carbonate (TUMS SMOOTHIES) 750 MG chewable tablet Chew by mouth.     dapsone 25 MG tablet Take 50 mg by mouth daily.     diphenoxylate-atropine (LOMOTIL) 2.5-0.025 MG/5ML liquid Take by mouth.     folic acid (FOLVITE) 1 MG tablet Take 1 mg by mouth daily.   6   glucose blood (ACCU-CHEK AVIVA PLUS) test strip 1 each by Other route 4 (four) times daily. DX:E11.9 100 each 6   insulin aspart (NOVOLOG FLEXPEN) 100 UNIT/ML FlexPen Inject 0-15 Units into the skin 3 (three) times  daily with meals 15 mL 11   Insulin Pen Needle (PEN NEEDLES 3/16") 31G X 5 MM MISC Use as directed Dx:E11.22 100 each 11   omeprazole (PRILOSEC) 20 MG capsule Take 1 capsule (20 mg total) by mouth daily. 30 capsule 3   sodium bicarbonate 650 MG tablet Take 650 mg by mouth 2 (two) times daily.     tacrolimus (PROGRAF) 1 MG capsule Take 3 capsules (3 mg total) by mouth 2 (two) times daily.     No current facility-administered medications for this visit.    REVIEW OF SYSTEMS:   Review of Systems  Constitutional: Negative for appetite change, chills, fatigue, fever and unexpected weight change.  HENT:   Negative for mouth sores, nosebleeds, sore throat and trouble swallowing.   Eyes: Negative for eye problems and icterus.  Respiratory: Negative for cough, hemoptysis, shortness of breath and wheezing.   Cardiovascular: Negative for chest pain and leg swelling.  Gastrointestinal: Negative for abdominal pain, constipation, diarrhea, nausea and vomiting.  Genitourinary: Negative for bladder incontinence, difficulty urinating, dysuria, frequency and hematuria.   Musculoskeletal: Negative for back pain, gait problem, neck pain and neck stiffness.  Skin: Negative for itching and rash.  Neurological: Negative for dizziness, extremity weakness, gait problem, headaches, light-headedness and seizures.  Hematological: Negative for adenopathy. Does not bruise/bleed easily.   Psychiatric/Behavioral: Negative for confusion, depression and sleep disturbance. The patient is not nervous/anxious.     PHYSICAL EXAMINATION:  There were no vitals taken for this visit.  ECOG PERFORMANCE STATUS: {CHL ONC ECOG [id number]  Physical Exam  Constitutional: Oriented to person, place, and time and well-developed, well-nourished, and in no distress. No distress.  HENT:  Head: Normocephalic and atraumatic.  Mouth/Throat: Oropharynx is clear and moist. No oropharyngeal exudate.  Eyes: Conjunctivae are normal. Right eye exhibits no discharge. Left eye exhibits no discharge. No scleral icterus.  Neck: Normal range of motion. Neck supple.  Cardiovascular: Normal rate, regular rhythm, normal heart sounds and intact distal pulses.   Pulmonary/Chest: Effort normal and breath sounds normal. No respiratory distress. No wheezes. No rales.  Abdominal: Soft. Bowel sounds are normal. Exhibits no distension and no mass. There is no tenderness.  Musculoskeletal: Normal range of motion. Exhibits no edema.  Lymphadenopathy:    No cervical adenopathy.  Neurological: Alert and oriented to person, place, and time. Exhibits normal muscle tone. Gait normal. Coordination normal.  Skin: Skin is warm and dry. No rash noted. Not diaphoretic. No erythema. No pallor.  Psychiatric: Mood, memory and judgment normal.  Vitals reviewed.  LABORATORY DATA: Lab Results  Component Value Date   WBC 10.5 07/07/2020   HGB 9.8 (A) 07/07/2020   HCT 30 (A) 07/07/2020   MCV 97.4 06/13/2019   PLT 77 (A) 07/07/2020      Chemistry      Component Value Date/Time   NA 139 07/07/2020 0000   K 4.2 07/07/2020 0000   CL 108 07/07/2020 0000   CL 102 05/27/2016 0000   CO2 20 07/07/2020 0000   CO2 25 05/27/2016 0000   BUN 47 (A) 07/07/2020 0000   CREATININE 4.8 (A) 07/07/2020 0000   CREATININE 10.28 (H) 06/13/2019 1406   CREATININE 6.25 (H) 02/06/2018 1434   GLU 201 07/07/2020 0000      Component Value  Date/Time   CALCIUM 7.2 (A) 07/07/2020 0000   CALCIUM 9.1 05/27/2016 0000   CALCIUM 5.6 Result repeated and verified. (LL) 12/23/2008 1630   ALKPHOS 95 07/07/2020 0000   AST 16 07/07/2020  0000   ALT 12 07/07/2020 0000   BILITOT 0.3 04/02/2020 1506       RADIOGRAPHIC STUDIES: No results found.  ASSESSMENT: This is a very pleasant 50 year old male referred to the clinic for thrombocytopenia   PLAN: The patient was seen with Dr. Julien Nordmann today.  The patient several lab studies performed including CBC, CMP, iron studies, ferritin, folate, B12, ANA with immunofixation, rheumatoid factor, hepatitis panel, and HIV testing performed.  The patient's labs today show:    Follow-up  No specific treatment unless platelet count is <***and/or the patient develops any abnormal bleeding or bruising.   The patient voices understanding of current disease status and treatment options and is in agreement with the current care plan.  All questions were answered. The patient knows to call the clinic with any problems, questions or concerns. We can certainly see the patient much sooner if necessary.  Thank you so much for allowing me to participate in the care of [name]. I will continue to follow up the patient with you and assist in his care.  I spent {CHL ONC TIME VISIT - IRJJO:[id number]} counseling the patient face to face. The total time spent in the appointment was {CHL ONC TIME VISIT - SWFUX:[id number]}.  Disclaimer: This note was dictated with voice recognition software. Similar sounding words can inadvertently be transcribed and may not be corrected upon review.   [name] August 03, 2021, 3:40 PM

## 2021-08-05 ENCOUNTER — Inpatient Hospital Stay: Payer: Medicare Other | Attending: Physician Assistant | Admitting: Physician Assistant

## 2021-08-05 ENCOUNTER — Other Ambulatory Visit: Payer: Self-pay | Admitting: Physician Assistant

## 2021-08-05 ENCOUNTER — Inpatient Hospital Stay: Payer: Medicare Other

## 2021-08-05 DIAGNOSIS — D696 Thrombocytopenia, unspecified: Secondary | ICD-10-CM

## 2021-10-20 ENCOUNTER — Other Ambulatory Visit: Payer: Self-pay

## 2021-10-20 ENCOUNTER — Ambulatory Visit (INDEPENDENT_AMBULATORY_CARE_PROVIDER_SITE_OTHER): Payer: Medicare Other | Admitting: Podiatry

## 2021-10-20 DIAGNOSIS — M2041 Other hammer toe(s) (acquired), right foot: Secondary | ICD-10-CM

## 2021-10-20 DIAGNOSIS — M79674 Pain in right toe(s): Secondary | ICD-10-CM

## 2021-10-20 DIAGNOSIS — M79675 Pain in left toe(s): Secondary | ICD-10-CM | POA: Diagnosis not present

## 2021-10-20 DIAGNOSIS — B351 Tinea unguium: Secondary | ICD-10-CM

## 2021-10-20 DIAGNOSIS — E1151 Type 2 diabetes mellitus with diabetic peripheral angiopathy without gangrene: Secondary | ICD-10-CM | POA: Diagnosis not present

## 2021-10-20 DIAGNOSIS — M2141 Flat foot [pes planus] (acquired), right foot: Secondary | ICD-10-CM

## 2021-10-20 DIAGNOSIS — M2142 Flat foot [pes planus] (acquired), left foot: Secondary | ICD-10-CM

## 2021-10-20 DIAGNOSIS — M2042 Other hammer toe(s) (acquired), left foot: Secondary | ICD-10-CM

## 2021-10-20 NOTE — Patient Instructions (Signed)

## 2021-10-24 NOTE — Progress Notes (Signed)
Subjective:  ? ?Patient ID: [name], male   DOB: 51 y.o.   MRN: [id number]  ? ?HPI ?51 year old male presents the office today requesting orthotics.  He states he has flatfeet and hammertoes and typically gets diabetic shoes, inserts but not able to get this at the place where he was going is closed.  He denies any open sores.  He is diabetic and his last A1c was 7.  Denies any ulcerations.  Nails are also thickened elongated he has difficulty trimming them.  No drainage or pus come from the toenail sites.  Has no other concerns. ? ? ?Review of Systems  ?All other systems reviewed and are negative. ? ?Past Medical History:  ?Diagnosis Date  ? Adequate hearing function   ? Anemia   ? CKD (chronic kidney disease), stage III   ? Diabetes mellitus 12/2008  ? 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 ESRD (end stage renal disease) on dialysis [facility])   ? 1.Initiated HD via right per cate 12/30/2008 (MWF schedule at [facility]) 2.Left upper extremity A-V fistula by Dr Oneida Alar 12/30/2008 3.Aemia: Received Venofer 12/30/08 Ferritin 179 % sat 12 12/23/08, 4. Secondary Hyperparathyroidism- PTH 188, P 5.4, Ca 9.2 12/2008  ? Has poorly balanced diet   ? Hemodialysis-associated hypotension   ? coreg d/c'd  ? Herniated lumbar disc without myelopathy   ? History of blood transfusion   ? Hyperlipemia   ? Hyperparathyroidism due to renal insufficiency ([place])   ? Hypertension   ? resloved after initating HD, now with post HD hypotension  ? Hypocalcemia   ? Hypomagnesemia   ? Kidney transplant as cause of abnormal reaction or later complication 0539  ? [place]  ? Non-ischemic cardiomyopathy [facility])   ? EF 20-25% 12/23/2008>>EF 40-45% 03/2009>>EF ??? 7/?/2011  ? Obese   ? Occult blood positive stool   ? Psoriasis   ? scalp  ? Thrombocytopenia ([place])   ? Vitamin B 12 deficiency   ? ? ?Past Surgical History:  ?Procedure Laterality  Date  ? AV FISTULA PLACEMENT    ? left  ? AV FISTULA PLACEMENT  09/12/2012  ? Procedure: INSERTION OF ARTERIOVENOUS (AV) GORE-TEX GRAFT ARM;  Surgeon: Elam Dutch, MD;  Location: [place];  Service: Vascular;  Laterality: Left;  KIDNEY TRANSPLANT FAILED  ? BIOPSY  02/01/2019  ? Procedure: BIOPSY;  Surgeon: Ronnette Juniper, MD;  Location: [place];  Service: Gastroenterology;;  ? CATARACT EXTRACTION, BILATERAL    ? COLONOSCOPY WITH PROPOFOL N/A 02/01/2019  ? Procedure: COLONOSCOPY WITH PROPOFOL;  Surgeon: Ronnette Juniper, MD;  Location: [place];  Service: Gastroenterology;  Laterality: N/A;  ? EYE SURGERY    ? INSERTION OF DIALYSIS CATHETER  07/14/2012  ? Procedure: INSERTION OF DIALYSIS CATHETER;  Surgeon: Mal Misty, MD;  Location: [place];  Service: Vascular;  Laterality: Right;  Ultrasound Guided Insertion of Internal Jugular Dialysis Catheter  ? IR THROMBECTOMY AV FISTULA W/THROMBOLYSIS/PTA INC/SHUNT/IMG LEFT Left 12/28/2018  ? IR US GUIDE VASC ACCESS LEFT  12/28/2018  ? KIDNEY TRANSPLANT  2013  ? 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  ? KIDNEY TRANSPLANT  12/2013  ? second kidney transplant  ? lumbar disc sugery   10/17/2017  ? LUMBAR DISC SURGERY    ? NEPHRECTOMY TRANSPLANTED ORGAN  08/11/12  ? PHOTOCOAGULATION    ? for diabetic retinopathy 08/1999  ? SHUNTOGRAM N/A 07/11/2012  ?  Procedure: SHUNTOGRAM;  Surgeon: Serafina Mitchell, MD;  Location: [facility] CATH LAB;  Service: Cardiovascular;  Laterality: N/A;  ? ? ? ?Current Outpatient Medications:  ?  Accu-Chek FastClix Lancets MISC, 1 each by In Vitro route 4 (four) times daily. DX:E11.9, Disp: 100 each, Rfl: 6 ?  aspirin EC 81 MG tablet, Take 81 mg by mouth daily., Disp: , Rfl:  ?  atorvastatin (LIPITOR) 40 MG tablet, Take 1 tablet (40 mg total) by mouth daily., Disp: 90 tablet, Rfl: 1 ?  calcium carbonate (TUMS SMOOTHIES) 750 MG chewable tablet, Chew by mouth., Disp: , Rfl:  ?  dapsone 25 MG tablet, Take 50 mg by mouth daily., Disp: , Rfl:  ?  diphenoxylate-atropine  (LOMOTIL) 2.5-0.025 MG/5ML liquid, Take by mouth., Disp: , Rfl:  ?  folic acid (FOLVITE) 1 MG tablet, Take 1 mg by mouth daily. , Disp: , Rfl: 6 ?  glucose blood (ACCU-CHEK AVIVA PLUS) test strip, 1 each by Other route 4 (four) times daily. DX:E11.9, Disp: 100 each, Rfl: 6 ?  insulin aspart (NOVOLOG FLEXPEN) 100 UNIT/ML FlexPen, Inject 0-15 Units into the skin 3 (three) times daily with meals, Disp: 15 mL, Rfl: 11 ?  Insulin Pen Needle (PEN NEEDLES 3/16") 31G X 5 MM MISC, Use as directed Dx:E11.22, Disp: 100 each, Rfl: 11 ?  omeprazole (PRILOSEC) 20 MG capsule, Take 1 capsule (20 mg total) by mouth daily., Disp: 30 capsule, Rfl: 3 ?  sodium bicarbonate 650 MG tablet, Take 650 mg by mouth 2 (two) times daily., Disp: , Rfl:  ?  tacrolimus (PROGRAF) 1 MG capsule, Take 3 capsules (3 mg total) by mouth 2 (two) times daily., Disp:  , Rfl:  ? ?Allergies  ?Allergen Reactions  ? Lactose Intolerance (Gi) Diarrhea  ?  No whole milk!!  ? Other Other (See Comments)  ?  Soy milk burns his tongue  ? Pollen Extract Other (See Comments)  ?  Itching, watery eyes and sneezing.  ? Tape Rash  ?  PLEASE USE COBAN WRAP ?Pt can't have paper tape  ? ? ? ? ?   ?Objective:  ?Physical Exam  ?General: AAO x3, NAD ? ?Dermatological: Nails are hypertrophic, dystrophic, brittle, discolored, elongated ?10. No surrounding redness or drainage. Tenderness nails 1-5 bilaterally. No open lesions or pre-ulcerative lesions are identified today. ? ?Vascular: Dorsalis Pedis artery and Posterior Tibial artery pedal pulses are decreased bilateral with immedate capillary fill time. There is no pain with calf compression, swelling, warmth, erythema.  ? ?Neruologic: Grossly intact via light touch bilateral.  ? ?Musculoskeletal: Crepitus present as well as hammertoes.  Muscular strength 5/5 in all groups tested bilateral. ? ?Gait: Unassisted, Nonantalgic.  ? ? ?   ?Assessment:  ? ?Type 2 diabetes with decreased pulses (without claudication symptoms or ulcers),  symptomatic onychomycosis ? ?   ?Plan:  ?-Treatment options discussed including all alternatives, risks, and complications ?-Etiology of symptoms were discussed ?-Sharp debrided nails x10 without any complications or bleeding. ?-We will follow-up with Aaron Edelman our orthotist for diabetic shoes, inserts ?-Discussed daily foot inspection ? ?Trula Slade DPM ? ?   ? ?

## 2021-11-30 ENCOUNTER — Telehealth: Payer: Self-pay | Admitting: Oncology

## 2021-11-30 NOTE — Telephone Encounter (Signed)
Scheduled appt per 4/17 referral. Pt is aware of appt date and time. Pt is aware to arrive 15 mins prior to appt time and to bring and updated insurance card. Pt is aware of appt location.   ?

## 2021-12-16 ENCOUNTER — Inpatient Hospital Stay: Payer: Medicare Other | Attending: Oncology | Admitting: Oncology

## 2021-12-16 ENCOUNTER — Other Ambulatory Visit: Payer: Self-pay

## 2021-12-16 VITALS — BP 151/75 | HR 97 | Temp 97.8°F | Resp 18 | Ht 65.0 in | Wt 153.5 lb

## 2021-12-16 DIAGNOSIS — Z833 Family history of diabetes mellitus: Secondary | ICD-10-CM | POA: Diagnosis not present

## 2021-12-16 DIAGNOSIS — Z888 Allergy status to other drugs, medicaments and biological substances status: Secondary | ICD-10-CM | POA: Insufficient documentation

## 2021-12-16 DIAGNOSIS — D693 Immune thrombocytopenic purpura: Secondary | ICD-10-CM | POA: Diagnosis present

## 2021-12-16 DIAGNOSIS — Z841 Family history of disorders of kidney and ureter: Secondary | ICD-10-CM | POA: Insufficient documentation

## 2021-12-16 DIAGNOSIS — Z8249 Family history of ischemic heart disease and other diseases of the circulatory system: Secondary | ICD-10-CM | POA: Insufficient documentation

## 2021-12-16 DIAGNOSIS — D696 Thrombocytopenia, unspecified: Secondary | ICD-10-CM

## 2021-12-16 DIAGNOSIS — Z818 Family history of other mental and behavioral disorders: Secondary | ICD-10-CM | POA: Insufficient documentation

## 2021-12-16 DIAGNOSIS — Z79899 Other long term (current) drug therapy: Secondary | ICD-10-CM | POA: Diagnosis not present

## 2021-12-16 DIAGNOSIS — Z940 Kidney transplant status: Secondary | ICD-10-CM | POA: Diagnosis not present

## 2021-12-16 NOTE — Progress Notes (Signed)
?Reason for the request:    Thrombocytopenia ? ?HPI: I was asked by Dr. Moshe Cipro to evaluate [name] for the evaluation of thrombocytopenia.  He is a 51 year old male with history of chronic renal failure and status post kidney transplant.  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  He subsequently received another DDRT in May 2015.  He had a complicated course at that time with thrombocytopenia and coagulopathy.  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  He did require hemodialysis subsequently although he has not required it for over 2 years now. ? ?His thrombocytopenia histories dating back to 2010 with a platelet count that have ranged from 72 in 20 10-842 in 2018.  It was up to normal range in 2020 up to 2012.  CBC in November 2021 showed a white cell count of 10.5, hemoglobin of 9.8 and a platelet count of 77.  His most recent CBC obtained on October 12, 2021 showed a white cell count of 8.3, hemoglobin of 10.9 and a platelet count of 98. ? ?Clinically, he reports ? ?He does not report any headaches, blurry vision, syncope or seizures. Does not report any fevers, chills or sweats.  Does not report any cough, wheezing or hemoptysis.  Does not report any chest pain, palpitation, orthopnea or leg edema.  Does not report any nausea, vomiting or abdominal pain.  Does not report any constipation or diarrhea.  Does not report any skeletal complaints.    Does not report frequency, urgency or hematuria.  Does not report any skin rashes or lesions. Does not report any heat or cold intolerance.  Does not report any lymphadenopathy or petechiae.  Does not report any anxiety or depression.  Remaining review of systems is negative.  ? ? ? ?Past Medical History:  ?Diagnosis Date  ? Adequate hearing function   ? Anemia   ? CKD (chronic kidney disease), stage III   ? Diabetes mellitus  12/2008  ? A1C 8.3 12/23/08, 24hr urine protein 2793 mg/day, SPEP neg, proliferative BDR s/p photocoagulation 08/2009 done at [facility] and f/u by DR Katy Fitch  ? ESRD (end stage renal disease) on dialysis [facility])   ? 1.Initiated HD via right per cate 12/30/2008 (MWF schedule at [facility]) 2.Left upper extremity A-V fistula by Dr Oneida Alar 12/30/2008 3.Aemia: Received Venofer 12/30/08 Ferritin 179 % sat 12 12/23/08, 4. Secondary Hyperparathyroidism- PTH 188, P 5.4, Ca 9.2 12/2008  ? Has poorly balanced diet   ? Hemodialysis-associated hypotension   ? coreg d/c'd  ? Herniated lumbar disc without myelopathy   ? History of blood transfusion   ? Hyperlipemia   ? Hyperparathyroidism due to renal insufficiency ([place])   ? Hypertension   ? resloved after initating HD, now with post HD hypotension  ? Hypocalcemia   ? Hypomagnesemia   ? Kidney transplant as cause of abnormal reaction or later complication 1856  ? [place]  ? Non-ischemic cardiomyopathy [facility])   ? EF 20-25% 12/23/2008>>EF 40-45% 03/2009>>EF ??? 7/?/2011  ? Obese   ? Occult blood positive stool   ? Psoriasis   ? scalp  ? Thrombocytopenia ([place])   ? Vitamin B 12 deficiency   ?: ? ? ?Past Surgical History:  ?Procedure Laterality Date  ? AV FISTULA PLACEMENT    ? left  ? AV FISTULA PLACEMENT  09/12/2012  ? Procedure: INSERTION OF ARTERIOVENOUS (AV) GORE-TEX GRAFT ARM;  Surgeon: Elam Dutch, MD;  Location: [place];  Service: Vascular;  Laterality: Left;  KIDNEY TRANSPLANT FAILED  ? BIOPSY  02/01/2019  ? Procedure: BIOPSY;  Surgeon: Ronnette Juniper, MD;  Location: [place];  Service: Gastroenterology;;  ? CATARACT EXTRACTION, BILATERAL    ? COLONOSCOPY WITH PROPOFOL N/A 02/01/2019  ? Procedure: COLONOSCOPY WITH PROPOFOL;  Surgeon: Ronnette Juniper, MD;  Location: [place];  Service: Gastroenterology;  Laterality: N/A;  ? EYE SURGERY    ? INSERTION OF DIALYSIS CATHETER  07/14/2012  ? Procedure: INSERTION OF DIALYSIS CATHETER;  Surgeon: Mal Misty, MD;  Location: [place];   Service: Vascular;  Laterality: Right;  Ultrasound Guided Insertion of Internal Jugular Dialysis Catheter  ? IR THROMBECTOMY AV FISTULA W/THROMBOLYSIS/PTA INC/SHUNT/IMG LEFT Left 12/28/2018  ? IR US GUIDE VASC ACCESS LEFT  12/28/2018  ? KIDNEY TRANSPLANT  2013  ? 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  ? KIDNEY TRANSPLANT  12/2013  ? second kidney transplant  ? lumbar disc sugery   10/17/2017  ? LUMBAR DISC SURGERY    ? NEPHRECTOMY TRANSPLANTED ORGAN  08/11/12  ? PHOTOCOAGULATION    ? for diabetic retinopathy 08/1999  ? SHUNTOGRAM N/A 07/11/2012  ? Procedure: SHUNTOGRAM;  Surgeon: Serafina Mitchell, MD;  Location: [facility] CATH LAB;  Service: Cardiovascular;  Laterality: N/A;  ?: ? ? ?Current Outpatient Medications:  ?  Accu-Chek FastClix Lancets MISC, 1 each by In Vitro route 4 (four) times daily. DX:E11.9, Disp: 100 each, Rfl: 6 ?  aspirin EC 81 MG tablet, Take 81 mg by mouth daily., Disp: , Rfl:  ?  atorvastatin (LIPITOR) 40 MG tablet, Take 1 tablet (40 mg total) by mouth daily., Disp: 90 tablet, Rfl: 1 ?  calcium carbonate (TUMS SMOOTHIES) 750 MG chewable tablet, Chew by mouth., Disp: , Rfl:  ?  dapsone 25 MG tablet, Take 50 mg by mouth daily., Disp: , Rfl:  ?  diphenoxylate-atropine (LOMOTIL) 2.5-0.025 MG/5ML liquid, Take by mouth., Disp: , Rfl:  ?  folic acid (FOLVITE) 1 MG tablet, Take 1 mg by mouth daily. , Disp: , Rfl: 6 ?  glucose blood (ACCU-CHEK AVIVA PLUS) test strip, 1 each by Other route 4 (four) times daily. DX:E11.9, Disp: 100 each, Rfl: 6 ?  insulin aspart (NOVOLOG FLEXPEN) 100 UNIT/ML FlexPen, Inject 0-15 Units into the skin 3 (three) times daily with meals, Disp: 15 mL, Rfl: 11 ?  Insulin Pen Needle (PEN NEEDLES 3/16") 31G X 5 MM MISC, Use as directed Dx:E11.22, Disp: 100 each, Rfl: 11 ?  omeprazole (PRILOSEC) 20 MG capsule, Take 1 capsule (20 mg total) by mouth daily., Disp: 30 capsule, Rfl: 3 ?  sodium bicarbonate 650 MG tablet, Take 650 mg by mouth 2 (two) times daily., Disp: , Rfl:  ?  tacrolimus  (PROGRAF) 1 MG capsule, Take 3 capsules (3 mg total) by mouth 2 (two) times daily., Disp:  , Rfl: : ? ? ?Allergies  ?Allergen Reactions  ? Lactose Intolerance (Gi) Diarrhea  ?  No whole milk!!  ? Other Other (See Comments)  ?  Soy milk burns his tongue  ? Pollen Extract Other (See Comments)  ?  Itching, watery eyes and sneezing.  ? Tape Rash  ?  PLEASE USE COBAN WRAP ?Pt can't have paper tape  ?: ? ? ?Family History  ?Problem Relation Age of Onset  ? Diabetes Father   ? Kidney disease Father   ? Other Father   ?     amputation  ? Hypertension  Mother   ? Dementia Mother   ? Diabetes Sister   ? Hypertension Sister   ? Diabetes Sister   ? Kidney disease Sister   ? Diabetes Sister   ? Hypertension Brother   ? Diabetes Brother   ? CAD Neg Hx   ? Sudden Cardiac Death Neg Hx   ? Congestive Heart Failure Neg Hx   ? Colon cancer Neg Hx   ? Rectal cancer Neg Hx   ? Esophageal cancer Neg Hx   ? Liver cancer Neg Hx   ?: ? ? ?Social History  ? ?Socioeconomic History  ? Marital status: Married  ?  Spouse name: Not on file  ? Number of children: 2  ? Years of education: Not on file  ? Highest education level: Not on file  ?Occupational History  ? Occupation: disabled  ?Tobacco Use  ? Smoking status: Never  ? Smokeless tobacco: Never  ?Vaping Use  ? Vaping Use: Never used  ?Substance and Sexual Activity  ? Alcohol use: No  ?  Alcohol/week: 0.0 standard drinks  ? Drug use: No  ? Sexual activity: Not on file  ?Other Topics Concern  ? Not on file  ?Social History Narrative  ? Social History  ?   ? Diet?   ?   ? Do you drink/eat things with caffeine? yes  ?   ? Marital status?            married                        What year were you married?   ?   ? Do you live in a house, apartment, assisted living, condo, trailer, etc.? apartment  ?   ? Is it one or more stories? one  ?   ? How many persons live in your home? 2  ?   ? Do you have any pets in your home? (please list) no  ?   ? Highest level of education completed?   ?   ? Current  or past profession: bus owner  ?   ? Do you exercise?            yes                          Type & how often? Walking daily  ?   ? Advanced Directives  ?   ? Do you have a living will? no  ?   ? Do you have a D

## 2022-01-21 ENCOUNTER — Ambulatory Visit: Payer: Medicare Other | Admitting: Podiatry

## 2023-06-09 ENCOUNTER — Inpatient Hospital Stay (HOSPITAL_COMMUNITY)
Admission: EM | Admit: 2023-06-09 | Discharge: 2023-06-12 | DRG: 981 | Disposition: A | Discharge status: Home / Self Care | Payer: Medicare Other | Attending: Internal Medicine | Admitting: Internal Medicine

## 2023-06-09 ENCOUNTER — Other Ambulatory Visit: Payer: Self-pay

## 2023-06-09 ENCOUNTER — Encounter (HOSPITAL_COMMUNITY): Payer: Self-pay

## 2023-06-09 DIAGNOSIS — E8779 Other fluid overload: Principal | ICD-10-CM | POA: Diagnosis present

## 2023-06-09 DIAGNOSIS — I132 Hypertensive heart and chronic kidney disease with heart failure and with stage 5 chronic kidney disease, or end stage renal disease: Secondary | ICD-10-CM | POA: Diagnosis present

## 2023-06-09 DIAGNOSIS — N2581 Secondary hyperparathyroidism of renal origin: Secondary | ICD-10-CM | POA: Diagnosis present

## 2023-06-09 DIAGNOSIS — E1122 Type 2 diabetes mellitus with diabetic chronic kidney disease: Secondary | ICD-10-CM | POA: Diagnosis not present

## 2023-06-09 DIAGNOSIS — Z8249 Family history of ischemic heart disease and other diseases of the circulatory system: Secondary | ICD-10-CM

## 2023-06-09 DIAGNOSIS — N186 End stage renal disease: Principal | ICD-10-CM | POA: Diagnosis present

## 2023-06-09 DIAGNOSIS — E11319 Type 2 diabetes mellitus with unspecified diabetic retinopathy without macular edema: Secondary | ICD-10-CM | POA: Diagnosis present

## 2023-06-09 DIAGNOSIS — N179 Acute kidney failure, unspecified: Secondary | ICD-10-CM | POA: Diagnosis present

## 2023-06-09 DIAGNOSIS — I871 Compression of vein: Secondary | ICD-10-CM | POA: Diagnosis present

## 2023-06-09 DIAGNOSIS — I509 Heart failure, unspecified: Secondary | ICD-10-CM

## 2023-06-09 DIAGNOSIS — T8612 Kidney transplant failure: Secondary | ICD-10-CM | POA: Diagnosis present

## 2023-06-09 DIAGNOSIS — Z833 Family history of diabetes mellitus: Secondary | ICD-10-CM

## 2023-06-09 DIAGNOSIS — E872 Acidosis, unspecified: Secondary | ICD-10-CM | POA: Diagnosis present

## 2023-06-09 DIAGNOSIS — R112 Nausea with vomiting, unspecified: Secondary | ICD-10-CM | POA: Diagnosis not present

## 2023-06-09 DIAGNOSIS — E785 Hyperlipidemia, unspecified: Secondary | ICD-10-CM | POA: Diagnosis present

## 2023-06-09 DIAGNOSIS — M898X9 Other specified disorders of bone, unspecified site: Secondary | ICD-10-CM | POA: Diagnosis present

## 2023-06-09 DIAGNOSIS — Z23 Encounter for immunization: Secondary | ICD-10-CM

## 2023-06-09 DIAGNOSIS — E119 Type 2 diabetes mellitus without complications: Secondary | ICD-10-CM

## 2023-06-09 DIAGNOSIS — Y830 Surgical operation with transplant of whole organ as the cause of abnormal reaction of the patient, or of later complication, without mention of misadventure at the time of the procedure: Secondary | ICD-10-CM | POA: Diagnosis present

## 2023-06-09 DIAGNOSIS — D638 Anemia in other chronic diseases classified elsewhere: Secondary | ICD-10-CM | POA: Diagnosis present

## 2023-06-09 DIAGNOSIS — Z841 Family history of disorders of kidney and ureter: Secondary | ICD-10-CM

## 2023-06-09 DIAGNOSIS — N189 Chronic kidney disease, unspecified: Secondary | ICD-10-CM | POA: Diagnosis present

## 2023-06-09 DIAGNOSIS — Z794 Long term (current) use of insulin: Secondary | ICD-10-CM

## 2023-06-09 DIAGNOSIS — R251 Tremor, unspecified: Secondary | ICD-10-CM | POA: Diagnosis present

## 2023-06-09 DIAGNOSIS — E8729 Other acidosis: Secondary | ICD-10-CM | POA: Insufficient documentation

## 2023-06-09 DIAGNOSIS — Z7982 Long term (current) use of aspirin: Secondary | ICD-10-CM

## 2023-06-09 DIAGNOSIS — I10 Essential (primary) hypertension: Secondary | ICD-10-CM | POA: Diagnosis present

## 2023-06-09 DIAGNOSIS — E739 Lactose intolerance, unspecified: Secondary | ICD-10-CM | POA: Diagnosis present

## 2023-06-09 DIAGNOSIS — Z992 Dependence on renal dialysis: Principal | ICD-10-CM

## 2023-06-09 DIAGNOSIS — Z91158 Patient's noncompliance with renal dialysis for other reason: Secondary | ICD-10-CM

## 2023-06-09 DIAGNOSIS — N19 Unspecified kidney failure: Secondary | ICD-10-CM | POA: Diagnosis present

## 2023-06-09 DIAGNOSIS — Y712 Prosthetic and other implants, materials and accessory cardiovascular devices associated with adverse incidents: Secondary | ICD-10-CM | POA: Diagnosis present

## 2023-06-09 DIAGNOSIS — D631 Anemia in chronic kidney disease: Secondary | ICD-10-CM | POA: Diagnosis present

## 2023-06-09 DIAGNOSIS — T82858A Stenosis of vascular prosthetic devices, implants and grafts, initial encounter: Secondary | ICD-10-CM | POA: Diagnosis present

## 2023-06-09 DIAGNOSIS — Z79621 Long term (current) use of calcineurin inhibitor: Secondary | ICD-10-CM

## 2023-06-09 DIAGNOSIS — I428 Other cardiomyopathies: Secondary | ICD-10-CM | POA: Diagnosis present

## 2023-06-09 DIAGNOSIS — N289 Disorder of kidney and ureter, unspecified: Secondary | ICD-10-CM

## 2023-06-09 DIAGNOSIS — I5032 Chronic diastolic (congestive) heart failure: Secondary | ICD-10-CM | POA: Diagnosis present

## 2023-06-09 LAB — CBC WITH DIFFERENTIAL/PLATELET
Abs Immature Granulocytes: 0.04 10*3/uL (ref 0.00–0.07)
Basophils Absolute: 0 10*3/uL (ref 0.0–0.1)
Basophils Relative: 0 %
Eosinophils Absolute: 0.2 10*3/uL (ref 0.0–0.5)
Eosinophils Relative: 2 %
HCT: 32.6 % — ABNORMAL LOW (ref 39.0–52.0)
Hemoglobin: 10.7 g/dL — ABNORMAL LOW (ref 13.0–17.0)
Immature Granulocytes: 0 %
Lymphocytes Relative: 17 %
Lymphs Abs: 1.7 10*3/uL (ref 0.7–4.0)
MCH: 29.3 pg (ref 26.0–34.0)
MCHC: 32.8 g/dL (ref 30.0–36.0)
MCV: 89.3 fL (ref 80.0–100.0)
Monocytes Absolute: 0.5 10*3/uL (ref 0.1–1.0)
Monocytes Relative: 5 %
Neutro Abs: 7.3 10*3/uL (ref 1.7–7.7)
Neutrophils Relative %: 76 %
Platelets: 79 10*3/uL — ABNORMAL LOW (ref 150–400)
RBC: 3.65 MIL/uL — ABNORMAL LOW (ref 4.22–5.81)
RDW: 14.6 % (ref 11.5–15.5)
WBC: 9.7 10*3/uL (ref 4.0–10.5)
nRBC: 0 % (ref 0.0–0.2)

## 2023-06-09 LAB — IRON AND TIBC
Iron: 158 ug/dL (ref 45–182)
Saturation Ratios: 77 % — ABNORMAL HIGH (ref 17.9–39.5)
TIBC: 206 ug/dL — ABNORMAL LOW (ref 250–450)
UIBC: 48 ug/dL

## 2023-06-09 LAB — FERRITIN: Ferritin: 1196 ng/mL — ABNORMAL HIGH (ref 24–336)

## 2023-06-09 LAB — URINALYSIS, ROUTINE W REFLEX MICROSCOPIC
Bacteria, UA: NONE SEEN
Bilirubin Urine: NEGATIVE
Glucose, UA: 50 mg/dL — AB
Ketones, ur: NEGATIVE mg/dL
Nitrite: NEGATIVE
Protein, ur: 100 mg/dL — AB
Specific Gravity, Urine: 1.012 (ref 1.005–1.030)
pH: 5 (ref 5.0–8.0)

## 2023-06-09 LAB — LIPASE, BLOOD: Lipase: 70 U/L — ABNORMAL HIGH (ref 11–51)

## 2023-06-09 LAB — COMPREHENSIVE METABOLIC PANEL
ALT: 12 U/L (ref 0–44)
AST: 16 U/L (ref 15–41)
Albumin: 4.3 g/dL (ref 3.5–5.0)
Alkaline Phosphatase: 87 U/L (ref 38–126)
Anion gap: 20 — ABNORMAL HIGH (ref 5–15)
BUN: 172 mg/dL — ABNORMAL HIGH (ref 6–20)
CO2: 10 mmol/L — ABNORMAL LOW (ref 22–32)
Calcium: 6.7 mg/dL — ABNORMAL LOW (ref 8.9–10.3)
Chloride: 110 mmol/L (ref 98–111)
Creatinine, Ser: 22.06 mg/dL — ABNORMAL HIGH (ref 0.61–1.24)
GFR, Estimated: 2 mL/min — ABNORMAL LOW (ref >60)
Glucose, Bld: 206 mg/dL — ABNORMAL HIGH (ref 70–99)
Potassium: 5 mmol/L (ref 3.5–5.1)
Sodium: 140 mmol/L (ref 135–145)
Total Bilirubin: 0.7 mg/dL (ref 0.3–1.2)
Total Protein: 8.1 g/dL (ref 6.5–8.1)

## 2023-06-09 LAB — HEPATITIS B SURFACE ANTIGEN: Hepatitis B Surface Ag: NONREACTIVE

## 2023-06-09 LAB — HIV ANTIBODY (ROUTINE TESTING W REFLEX): HIV Screen 4th Generation wRfx: NONREACTIVE

## 2023-06-09 MED ORDER — PROCHLORPERAZINE EDISYLATE 10 MG/2ML IJ SOLN
10.0000 mg | INTRAMUSCULAR | Status: AC
  Administered 2023-06-09: 10 mg via INTRAVENOUS
  Filled 2023-06-09: qty 2

## 2023-06-09 MED ORDER — CALCIUM CARBONATE 1250 (500 CA) MG PO TABS
1250.0000 mg | ORAL_TABLET | Freq: Two times a day (BID) | ORAL | Status: DC
  Administered 2023-06-10 – 2023-06-12 (×4): 1250 mg via ORAL
  Filled 2023-06-09 (×4): qty 1

## 2023-06-09 MED ORDER — ACETAMINOPHEN 325 MG PO TABS: 650.0000 mg | ORAL_TABLET | Freq: Four times a day (QID) | ORAL | Status: DC | PRN

## 2023-06-09 MED ORDER — INFLUENZA VIRUS VACC SPLIT PF (FLUZONE) 0.5 ML IM SUSY
0.5000 mL | PREFILLED_SYRINGE | INTRAMUSCULAR | Status: AC
  Administered 2023-06-10: 0.5 mL via INTRAMUSCULAR
  Filled 2023-06-09: qty 0.5

## 2023-06-09 MED ORDER — ACETAMINOPHEN 650 MG RE SUPP: 650.0000 mg | Freq: Four times a day (QID) | RECTAL | Status: DC | PRN

## 2023-06-09 MED ORDER — DAPSONE 25 MG PO TABS
50.0000 mg | ORAL_TABLET | Freq: Every day | ORAL | Status: DC
  Administered 2023-06-10 – 2023-06-12 (×3): 50 mg via ORAL
  Filled 2023-06-09 (×4): qty 2

## 2023-06-09 MED ORDER — CHLORHEXIDINE GLUCONATE CLOTH 2 % EX PADS
6.0000 | MEDICATED_PAD | Freq: Every day | CUTANEOUS | Status: DC
  Administered 2023-06-10 – 2023-06-12 (×3): 6 via TOPICAL

## 2023-06-09 MED ORDER — RENA-VITE PO TABS
1.0000 | ORAL_TABLET | Freq: Every day | ORAL | Status: DC
  Administered 2023-06-09 – 2023-06-11 (×3): 1 via ORAL
  Filled 2023-06-09 (×3): qty 1

## 2023-06-09 MED ORDER — HEPARIN SODIUM (PORCINE) 5000 UNIT/ML IJ SOLN
5000.0000 [IU] | Freq: Three times a day (TID) | INTRAMUSCULAR | Status: DC
Start: 2023-06-09 — End: 2023-06-12
  Administered 2023-06-09 – 2023-06-12 (×8): 5000 [IU] via SUBCUTANEOUS
  Filled 2023-06-09 (×8): qty 1

## 2023-06-09 MED ORDER — TACROLIMUS 1 MG PO CAPS
3.0000 mg | ORAL_CAPSULE | Freq: Two times a day (BID) | ORAL | Status: DC
  Administered 2023-06-09 – 2023-06-12 (×5): 3 mg via ORAL
  Filled 2023-06-09 (×7): qty 3

## 2023-06-09 MED ORDER — PANTOPRAZOLE SODIUM 40 MG PO TBEC
40.0000 mg | DELAYED_RELEASE_TABLET | Freq: Every day | ORAL | Status: DC
  Administered 2023-06-12: 40 mg via ORAL
  Filled 2023-06-09 (×2): qty 1

## 2023-06-09 MED ORDER — RENA-VITE RX 1 MG PO TABS: 1.0000 | ORAL_TABLET | Freq: Every day | ORAL | Status: DC

## 2023-06-09 MED ORDER — SODIUM CHLORIDE 0.9% FLUSH
3.0000 mL | Freq: Two times a day (BID) | INTRAVENOUS | Status: DC
Start: 2023-06-09 — End: 2023-06-12
  Administered 2023-06-09 – 2023-06-12 (×5): 3 mL via INTRAVENOUS

## 2023-06-09 MED ORDER — POLYETHYLENE GLYCOL 3350 17 G PO PACK
17.0000 g | PACK | Freq: Every day | ORAL | Status: DC | PRN
Start: 2023-06-09 — End: 2023-06-12

## 2023-06-09 NOTE — H&P (Signed)
History and Physical   [name] YNW:[id number] DOB: [birth year] DOA: 06/09/2023  PCP: Pcp, No   Patient coming from: Home  Chief Complaint: Nausea, vomiting, missed HD  HPI: [name] is a 52 y.o. male with medical history significant of hypertension, hyperlipidemia, diabetes, ESRD with history of failed renal transplant x 2, anemia, CHF, QT prolongation presenting with nausea vomiting in the setting of missed HD sessions.  Patient has had a week or so of nausea vomiting.  Per chart and family patient missed dialysis since 921.  Had been refusing to go due to being "too busy".  Was restarted back on dialysis at the beginning of this year.  History of ESRD s/p transplant 07/08/12 which failed due to thrombus and repeat deceased donor transplant in 07-08-14 now back on dialysis.  Denies fever, chills, chest pain or shortness of breath, abdominal pain, constipation, diarrhea.  ED Course: Vital signs in the ED notable for blood pressure in the 160s to 170 systolic.  Lab workup included BMP with bicarb 10, gap 20, BUN 172, creatinine 22, glucose 206, calcium 6.7.  CBC with hemoglobin stable at 10.7 and platelets stable at 79.  Lipase stable at 70.  Tacro level pending.  Urinalysis with glucose, hemoglobin, protein, small leukocytes.  No imaging in the ED.  Received a dose of Compazine.  Nephrology consulted for dialysis during admission and stated he may need multiple sessions prior to discharge.  Review of Systems: As per HPI otherwise all other systems reviewed and are negative.  Past Medical History:  Diagnosis Date   Acute renal failure superimposed on stage 5 chronic kidney disease, not on chronic dialysis (HCC) 11/29/2017   Adequate hearing function    Anemia    CKD (chronic kidney disease), stage III (HCC)    Diabetes mellitus 12/2008   A1C 8.3 12/23/08, 24hr urine protein 07-08-92 mg/day, SPEP neg, proliferative BDR s/p photocoagulation 08/2009 done at [facility] and f/u by DR Groat   DKA  (diabetic ketoacidosis) (HCC) 12/24/2018   IMO SNOMED Dx Update Oct 2024     ESRD (end stage renal disease) on dialysis (HCC)    1.Initiated HD via right per cate 12/30/2008 (MWF schedule at [facility]) 2.Left upper extremity A-V fistula by Dr Darrick Penna 12/30/2008 3.Aemia: Received Venofer 12/30/08 Ferritin 179 % sat 12 12/23/08, 4. Secondary Hyperparathyroidism- PTH 188, P 5.4, Ca 9.2 12/2008   Has poorly balanced diet    Hemodialysis-associated hypotension    coreg d/c'd   Herniated lumbar disc without myelopathy    History of blood transfusion    Hyperlipemia    Hyperparathyroidism due to renal insufficiency (HCC)    Hypertension    resloved after initating HD, now with post HD hypotension   Hypocalcemia    Hypomagnesemia    Kidney transplant as cause of abnormal reaction or later complication 2014/07/08   [facility]   Non-ischemic cardiomyopathy (HCC)    EF 20-25% 12/23/2008>>EF 40-45% 03/2009>>EF ??? 7/?/2011   Obese    Occult blood positive stool    Psoriasis    scalp   Thrombocytopenia (HCC)    Vitamin B 12 deficiency     Past Surgical History:  Procedure Laterality Date   AV FISTULA PLACEMENT     left   AV FISTULA PLACEMENT  09/12/2012   Procedure: INSERTION OF ARTERIOVENOUS (AV) GORE-TEX GRAFT ARM;  Surgeon: Sherren Kerns, MD;  Location: [facility] OR;  Service: Vascular;  Laterality: Left;  KIDNEY TRANSPLANT FAILED   BIOPSY  02/01/2019  Procedure: BIOPSY;  Surgeon: Kerin Salen, MD;  Location: [facility] ENDOSCOPY;  Service: Gastroenterology;;   CATARACT EXTRACTION, BILATERAL     COLONOSCOPY WITH PROPOFOL N/A 02/01/2019   Procedure: COLONOSCOPY WITH PROPOFOL;  Surgeon: Kerin Salen, MD;  Location: [facility] ENDOSCOPY;  Service: Gastroenterology;  Laterality: N/A;   EYE SURGERY     INSERTION OF DIALYSIS CATHETER  07/14/2012   Procedure: INSERTION OF DIALYSIS CATHETER;  Surgeon: Pryor Ochoa, MD;  Location: [facility] OR;  Service: Vascular;  Laterality: Right;  Ultrasound Guided Insertion of Internal  Jugular Dialysis Catheter   IR THROMBECTOMY AV FISTULA W/THROMBOLYSIS/PTA INC/SHUNT/IMG LEFT Left 12/28/2018   IR US GUIDE VASC ACCESS LEFT  12/28/2018   KIDNEY TRANSPLANT  2013   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   KIDNEY TRANSPLANT  12/2013   second kidney transplant   lumbar disc sugery   10/17/2017   LUMBAR DISC SURGERY     NEPHRECTOMY TRANSPLANTED ORGAN  08/11/12   PHOTOCOAGULATION     for diabetic retinopathy 08/1999   SHUNTOGRAM N/A 07/11/2012   Procedure: Betsey Amen;  Surgeon: Nada Libman, MD;  Location: [facility] CATH LAB;  Service: Cardiovascular;  Laterality: N/A;    Social History  reports that he has never smoked. He has never used smokeless tobacco. He reports that he does not drink alcohol and does not use drugs.  Allergies  Allergen Reactions   Lactose Intolerance (Gi) Diarrhea    No whole milk!!   Other Other (See Comments)    Soy milk burns his tongue   Pollen Extract Other (See Comments)    Itching, watery eyes and sneezing.   Tape Rash    PLEASE USE COBAN WRAP Pt can't have paper tape    Family History  Problem Relation Age of Onset   Diabetes Father    Kidney disease Father    Other Father        amputation   Hypertension Mother    Dementia Mother    Diabetes Sister    Hypertension Sister    Diabetes Sister    Kidney disease Sister    Diabetes Sister    Hypertension Brother    Diabetes Brother    CAD Neg Hx    Sudden Cardiac Death Neg Hx    Congestive Heart Failure Neg Hx    Colon cancer Neg Hx    Rectal cancer Neg Hx    Esophageal cancer Neg Hx    Liver cancer Neg Hx   Reviewed on admission  Prior to Admission medications   Medication Sig Start Date End Date Taking? Authorizing Provider  acetaminophen (TYLENOL) 500 MG tablet Take 500 mg by mouth every 6 (six) hours as needed for mild pain (pain score 1-3).   Yes [provider]  aspirin EC 81 MG tablet Take 81 mg by mouth daily.   Yes [provider]  B Complex-C-Folic  Acid (RENA-VITE RX) 1 MG TABS Take 1 tablet by mouth daily. 06/12/21  Yes [provider]  calcium carbonate (OSCAL) 1500 (600 Ca) MG TABS tablet Take 1,500 mg by mouth 2 (two) times daily with a meal.   Yes [provider]  calcium carbonate (TUMS SMOOTHIES) 750 MG chewable tablet Chew 1 tablet by mouth as needed (heartburn, dialysis). 04/17/19  Yes [provider]  dapsone 25 MG tablet Take 50 mg by mouth daily.   Yes [provider]  omeprazole (PRILOSEC) 20 MG capsule Take 1 capsule (20 mg total) by mouth daily. 12/20/17  Yes  Sharon Seller, NP  tacrolimus (PROGRAF) 1 MG capsule Take 3 capsules (3 mg total) by mouth 2 (two) times daily. 02/05/19  Yes Dahal, Melina Schools, MD  Accu-Chek FastClix Lancets MISC 1 each by In Vitro route 4 (four) times daily. DX:E11.9 02/13/19   Sharon Seller, NP  glucose blood (ACCU-CHEK AVIVA PLUS) test strip 1 each by Other route 4 (four) times daily. DX:E11.9 02/13/19   Sharon Seller, NP    Physical Exam: Vitals:   06/09/23 1306 06/09/23 1600 06/09/23 1630 06/09/23 1645  BP: (!) 174/90 (!) 166/90 (!) 162/93 (!) 162/80  Pulse: 97 86 87 84  Resp: 18 12 16 16   Temp: 98 F (36.7 C)     SpO2: 100% 100% 100% 100%  Weight:      Height:        Physical Exam Constitutional:      General: He is not in acute distress.    Appearance: Normal appearance.  HENT:     Head: Normocephalic and atraumatic.     Mouth/Throat:     Mouth: Mucous membranes are moist.     Pharynx: Oropharynx is clear.  Eyes:     Extraocular Movements: Extraocular movements intact.     Pupils: Pupils are equal, round, and reactive to light.  Cardiovascular:     Rate and Rhythm: Normal rate and regular rhythm.     Pulses: Normal pulses.     Heart sounds: Normal heart sounds.  Pulmonary:     Effort: Pulmonary effort is normal. No respiratory distress.     Breath sounds: Normal breath sounds.  Abdominal:     General: Bowel sounds are normal. There  is no distension.     Palpations: Abdomen is soft.     Tenderness: There is no abdominal tenderness.  Musculoskeletal:        General: No swelling or deformity.  Skin:    General: Skin is warm and dry.  Neurological:     General: No focal deficit present.     Mental Status: Mental status is at baseline.     Comments: Tremor (chronic)    Labs on Admission: I have personally reviewed following labs and imaging studies  CBC: Recent Labs  Lab 06/09/23 1330  WBC 9.7  NEUTROABS 7.3  HGB 10.7*  HCT 32.6*  MCV 89.3  PLT 79*    Basic Metabolic Panel: Recent Labs  Lab 06/09/23 1330  NA 140  K 5.0  CL 110  CO2 10*  GLUCOSE 206*  BUN 172*  CREATININE 22.06*  CALCIUM 6.7*    GFR: Estimated Creatinine Clearance: 3.3 mL/min (A) (by C-G formula based on SCr of 22.06 mg/dL (H)).  Liver Function Tests: Recent Labs  Lab 06/09/23 1330  AST 16  ALT 12  ALKPHOS 87  BILITOT 0.7  PROT 8.1  ALBUMIN 4.3    Urine analysis:    Component Value Date/Time   COLORURINE YELLOW 06/09/2023 1325   APPEARANCEUR HAZY (A) 06/09/2023 1325   LABSPEC 1.012 06/09/2023 1325   PHURINE 5.0 06/09/2023 1325   GLUCOSEU 50 (A) 06/09/2023 1325   HGBUR MODERATE (A) 06/09/2023 1325   BILIRUBINUR NEGATIVE 06/09/2023 1325   KETONESUR NEGATIVE 06/09/2023 1325   PROTEINUR 100 (A) 06/09/2023 1325   UROBILINOGEN 0.2 12/23/2008 1845   NITRITE NEGATIVE 06/09/2023 1325   LEUKOCYTESUR SMALL (A) 06/09/2023 1325    Radiological Exams on Admission: No results found.  EKG: Independently reviewed.  Normal sinus rhythm 9 7 bpm.  QTc 495.  Nonspecific T wave flattening in aVL.  Assessment/Plan Principal Problem:   Uremia Active Problems:   DM2 (diabetes mellitus, type 2) (HCC)   Anemia in chronic kidney disease   Essential hypertension, benign   End stage renal disease (HCC)   Hyperlipidemia with target low density lipoprotein (LDL) cholesterol less than 100 mg/dL   Acute on chronic renal failure  (HCC)   CHF (congestive heart failure) (HCC)   High anion gap metabolic acidosis   Uremia High anion gap metabolic acidosis  ESRD on HD > Patient presented with nausea and vomiting in the setting of missed HD for the past month. > Started back on HD earlier this year after second failed renal transplant. In ED> electrolytes relatively stable but bicarb 10, gap 20, BUN 172, creatinine 22. > Nausea vomiting likely secondary to uremia.  Nephrology consulted and patient will be dialyzed while admitted.  May need serial dialysis prior to discharge. - Monitor on telemetry overnight - Appreciate nephrology recommendations and assistance - Continue with dialysis per nephrology - Trend renal function and electrolytes - Continue home tacrolimus and prophylactic dapsone  Hypertension Hyperlipidemia Diabetes - Not currently taking any medications for these  CHF > History of diastolic CHF in chart.  Last echo in 2020 with EF greater than 65%, normal diastolic function, normal RV function.  Did show possible VSD. - Noted - Volume is managed by HD  DVT prophylaxis: Heparin Code Status:   Full Family Communication:  Updated at bedside  Disposition Plan:   Patient is from:  Home  Anticipated DC to:  Home  Anticipated DC date:  1 to 3 days  Anticipated DC barriers: None  Consults called:  Nephrology Admission status:  Observation, telemetry  Severity of Illness: The appropriate patient status for this patient is OBSERVATION. Observation status is judged to be reasonable and necessary in order to provide the required intensity of service to ensure the patient's safety. The patient's presenting symptoms, physical exam findings, and initial radiographic and laboratory data in the context of their medical condition is felt to place them at decreased risk for further clinical deterioration. Furthermore, it is anticipated that the patient will be medically stable for discharge from the hospital within  2 midnights of admission.    Synetta Fail MD [facility]  How to contact the [facility] Attending or Consulting provider 7A - 7P or covering provider during after hours 7P -7A, for this patient?   Check the care team in [facility] and look for a) attending/consulting [facility] provider listed and b) the [facility] team listed Log into [email or web address] and use [place]'s universal password to access. If you do not have the password, please contact the hospital operator. Locate the [facility] provider you are looking for under [facility] and page to a number that you can be directly reached. If you still have difficulty reaching the provider, please page the [facility] (Director on Call) for the Hospitalists listed on amion for assistance.  06/09/2023, 5:36 PM

## 2023-06-09 NOTE — ED Provider Notes (Signed)
[facility] Provider Note   CSN: [id number] Arrival date & time: 06/09/23  1235     History  Chief Complaint  Patient presents with   Emesis    [name] is a 52 y.o. male.  52 year old male with a history of ESRD status post renal transplant in 2015 that failed and is now on TTS IHD who presents to the emergency department with weakness and nausea and vomiting.  Patient last went to dialysis on 05/07/2023.  Gets dialysis at [facility]  Has not been going because he has been busy.  For the past 2 weeks has been starting to have fatigue and nausea and vomiting.  Nonbloody nonbilious.  Has been having approximately 3/day.  No significant abdominal pain.  No diarrhea recently.  Having regular bowel movements.  Still passing gas.  Spouse states that he is also been having some delayed speech but no other confusion.  No bleeding.  No chest pain.  Still takes tacrolimus.       Home Medications Prior to Admission medications   Medication Sig Start Date End Date Taking? Authorizing Provider  acetaminophen (TYLENOL) 500 MG tablet Take 500 mg by mouth every 6 (six) hours as needed for mild pain (pain score 1-3).   Yes [provider]  aspirin EC 81 MG tablet Take 81 mg by mouth daily.   Yes [provider]  B Complex-C-Folic Acid (RENA-VITE RX) 1 MG TABS Take 1 tablet by mouth daily. 06/12/21  Yes [provider]  calcium carbonate (OSCAL) 1500 (600 Ca) MG TABS tablet Take 1,500 mg by mouth 2 (two) times daily with a meal.   Yes [provider]  calcium carbonate (TUMS SMOOTHIES) 750 MG chewable tablet Chew 1 tablet by mouth as needed (heartburn, dialysis). 04/17/19  Yes [provider]  dapsone 25 MG tablet Take 50 mg by mouth daily.   Yes [provider]  omeprazole (PRILOSEC) 20 MG capsule Take 1 capsule (20 mg total) by mouth daily. 12/20/17  Yes Sharon Seller, NP  tacrolimus (PROGRAF) 1  MG capsule Take 3 capsules (3 mg total) by mouth 2 (two) times daily. 02/05/19  Yes Dahal, Melina Schools, MD  Accu-Chek FastClix Lancets MISC 1 each by In Vitro route 4 (four) times daily. DX:E11.9 02/13/19   Sharon Seller, NP  glucose blood (ACCU-CHEK AVIVA PLUS) test strip 1 each by Other route 4 (four) times daily. DX:E11.9 02/13/19   Sharon Seller, NP      Allergies    Lactose intolerance (gi), Other, Pollen extract, and Tape    Review of Systems   Review of Systems  Physical Exam Updated Vital Signs BP (!) 171/84 (BP Location: Right Arm)   Pulse 91   Temp 97.6 F (36.4 C) (Oral)   Resp 16   Ht 5\' 5"  (1.651 m)   Wt 69.6 kg   SpO2 100%   BMI 25.53 kg/m  Physical Exam Vitals and nursing note reviewed.  Constitutional:      General: He is not in acute distress.    Appearance: He is well-developed.  HENT:     Head: Normocephalic and atraumatic.     Right Ear: External ear normal.     Left Ear: External ear normal.     Nose: Nose normal.  Eyes:     Extraocular Movements: Extraocular movements intact.     Conjunctiva/sclera: Conjunctivae normal.     Pupils: Pupils are equal, round, and reactive to  light.  Cardiovascular:     Rate and Rhythm: Normal rate and regular rhythm.     Heart sounds: Normal heart sounds.  Pulmonary:     Effort: Pulmonary effort is normal. No respiratory distress.     Breath sounds: Normal breath sounds.  Abdominal:     General: There is no distension.     Palpations: Abdomen is soft. There is no mass.     Tenderness: There is no abdominal tenderness. There is no guarding.  Musculoskeletal:     Cervical back: Normal range of motion and neck supple.     Right lower leg: No edema.     Left lower leg: No edema.     Comments: Fistula in left upper extremity with bruit and thrill  Skin:    General: Skin is warm and dry.  Neurological:     Mental Status: He is alert. Mental status is at baseline.  Psychiatric:        Mood and Affect: Mood  normal.        Behavior: Behavior normal.     ED Results / Procedures / Treatments   Labs (all labs ordered are listed, but only abnormal results are displayed) Labs Reviewed  CBC WITH DIFFERENTIAL/PLATELET - Abnormal; Notable for the following components:      Result Value   RBC 3.65 (*)    Hemoglobin 10.7 (*)    HCT 32.6 (*)    Platelets 79 (*)    All other components within normal limits  COMPREHENSIVE METABOLIC PANEL - Abnormal; Notable for the following components:   CO2 10 (*)    Glucose, Bld 206 (*)    BUN 172 (*)    Creatinine, Ser 22.06 (*)    Calcium 6.7 (*)    GFR, Estimated 2 (*)    Anion gap 20 (*)    All other components within normal limits  LIPASE, BLOOD - Abnormal; Notable for the following components:   Lipase 70 (*)    All other components within normal limits  URINALYSIS, ROUTINE W REFLEX MICROSCOPIC - Abnormal; Notable for the following components:   APPearance HAZY (*)    Glucose, UA 50 (*)    Hgb urine dipstick MODERATE (*)    Protein, ur 100 (*)    Leukocytes,Ua SMALL (*)    All other components within normal limits  IRON AND TIBC - Abnormal; Notable for the following components:   TIBC 206 (*)    Saturation Ratios 77 (*)    All other components within normal limits  FERRITIN - Abnormal; Notable for the following components:   Ferritin 1,196 (*)    All other components within normal limits  TACROLIMUS LEVEL  HIV ANTIBODY (ROUTINE TESTING W REFLEX)  CBC  RENAL FUNCTION PANEL  PTH, INTACT AND CALCIUM  HEPATITIS B SURFACE ANTIGEN  HEPATITIS B SURFACE ANTIBODY, QUANTITATIVE    EKG EKG Interpretation Date/Time:  Thursday June 09 2023 13:09:54 EDT Ventricular Rate:  97 PR Interval:  144 QRS Duration:  88 QT Interval:  390 QTC Calculation: 495 R Axis:   -20  Text Interpretation: Normal sinus rhythm Prolonged QT Abnormal ECG Confirmed by Vonita Moss [phone]) on 06/09/2023 2:15:17 PM  Radiology No results  found.  Procedures Procedures    Medications Ordered in ED Medications  dapsone tablet 50 mg (has no administration in time range)  pantoprazole (PROTONIX) EC tablet 40 mg (has no administration in time range)  tacrolimus (PROGRAF) capsule 3 mg (has no administration in time range)  calcium carbonate (OS-CAL - dosed in mg of elemental calcium) tablet 1,250 mg (has no administration in time range)  heparin injection 5,000 Units (has no administration in time range)  sodium chloride flush (NS) 0.9 % injection 3 mL (has no administration in time range)  acetaminophen (TYLENOL) tablet 650 mg (has no administration in time range)    Or  acetaminophen (TYLENOL) suppository 650 mg (has no administration in time range)  polyethylene glycol (MIRALAX / GLYCOLAX) packet 17 g (has no administration in time range)  multivitamin (RENA-VIT) tablet 1 tablet (has no administration in time range)  influenza vac split trivalent PF (FLULAVAL) injection 0.5 mL (has no administration in time range)  Chlorhexidine Gluconate Cloth 2 % PADS 6 each (has no administration in time range)  prochlorperazine (COMPAZINE) injection 10 mg (10 mg Intravenous Given 06/09/23 1706)    ED Course/ Medical Decision Making/ A&P Clinical Course as of 06/09/23 2139  Thu Jun 09, 2023  1626 Dr Signe Colt from nephrology consulted.  Recommends admission for dialysis reinitiation. [RP]  1732 Dr Alinda Money from hospitalist to admit the patient [RP]    Clinical Course User Index [RP] Rondel Baton, MD                                 Medical Decision Making Amount and/or Complexity of Data Reviewed Labs: ordered.  Risk Prescription drug management. Decision regarding hospitalization.   Keveon Niven is a 52 y.o. male with comorbidities that complicate the patient evaluation including ESRD status post renal transplant in 2015 that failed and is now on TTS IHD who presents to the emergency department with weakness and nausea and  vomiting.    Initial Ddx:  Uremia, hyperkalemia, volume overload, gastroenteritis, intra-abdominal abscess/infection  MDM/Course:  Patient presents to the emergency department with nausea and vomiting, generalized fatigue, and some delayed responses.  This in the setting of missing dialysis for a month.  On exam does not have signs of volume overload or other acute findings.  No significant abdominal tenderness to palpation to suggest intra-abdominal abscess or bowel obstruction that would warrant imaging.  EKG without signs of hyperkalemia.  Labs were obtained which showed that he does have very elevated BUN.  Potassium was WNL.  Did send tacrolimus level as well which is pending at this time.  Upon re-evaluation patient remained stable.  Nephrology was consulted and feels the patient is high risk for dialysis disequilibrium and may need several sessions so we will admit him to hospitalist.  This patient presents to the ED for concern of complaints listed in HPI, this involves an extensive number of treatment options, and is a complaint that carries with it a high risk of complications and morbidity. Disposition including potential need for admission considered.   Dispo: Admit to Floor  Additional history obtained from spouse Records reviewed Outpatient Clinic Notes The following labs were independently interpreted: Chemistry and show AKI I personally reviewed and interpreted cardiac monitoring: normal sinus rhythm  I personally reviewed and interpreted the pt's EKG: see above for interpretation  I have reviewed the patients home medications and made adjustments as needed Consults: Hospitalist and Nephrology  Portions of this note were generated with Scientist, clinical (histocompatibility and immunogenetics). Dictation errors may occur despite best attempts at proofreading.           Final Clinical Impression(s) / ED Diagnoses Final diagnoses:  ESRD on hemodialysis (HCC)  Uremia  Nausea and vomiting,  unspecified  vomiting type    Rx / DC Orders ED Discharge Orders     None         Rondel Baton, MD 06/09/23 2140

## 2023-06-09 NOTE — ED Notes (Signed)
ED TO INPATIENT HANDOFF REPORT  ED Nurse Name and Phone #: Alphonzo Lemmings, RN   S Name/Age/Gender [name] 52 y.o. male Room/Bed: 001C/001C  Code Status   Code Status: Full Code  Home/SNF/Other Home Patient oriented to: self, place, time, and situation Is this baseline? Yes   Triage Complete: Triage complete  Chief Complaint Uremia [N19]  Triage Note Pt c/o N/Vx1wk. Pt dialysis Tues, Thurs, Sat. Per pt family member, pt hasn't been to dialysis since 05/07/23. Pt has been refusing to go, because he was too busy.   Allergies Allergies  Allergen Reactions   Lactose Intolerance (Gi) Diarrhea    No whole milk!!   Other Other (See Comments)    Soy milk burns his tongue   Pollen Extract Other (See Comments)    Itching, watery eyes and sneezing.   Tape Rash    PLEASE USE COBAN WRAP Pt can't have paper tape    Level of Care/Admitting Diagnosis ED Disposition     ED Disposition  Admit   Condition  --   Comment  Hospital Area: [facility] [[id number]]  Level of Care: Telemetry Medical [104]  May place patient in observation at [facility] or [facility] if equivalent level of care is available:: No  Covid Evaluation: Asymptomatic - no recent exposure (last 10 days) testing not required  Diagnosis: Uremia [223110]  Admitting Physician: Synetta Fail [[id number]]  Attending Physician: Synetta Fail [[id number]]          B Medical/Surgery History Past Medical History:  Diagnosis Date   Acute renal failure superimposed on stage 5 chronic kidney disease, not on chronic dialysis (HCC) 11/29/2017   Adequate hearing function    Anemia    CKD (chronic kidney disease), stage III (HCC)    Diabetes mellitus 12/2008   A1C 8.3 12/23/08, 24hr urine protein 2793 mg/day, SPEP neg, proliferative BDR s/p photocoagulation 08/2009 done at [facility] and f/u by DR Groat   DKA (diabetic ketoacidosis) (HCC) 12/24/2018   IMO SNOMED Dx Update Oct 2024     ESRD (end stage  renal disease) on dialysis (HCC)    1.Initiated HD via right per cate 12/30/2008 (MWF schedule at [facility]) 2.Left upper extremity A-V fistula by Dr Darrick Penna 12/30/2008 3.Aemia: Received Venofer 12/30/08 Ferritin 179 % sat 12 12/23/08, 4. Secondary Hyperparathyroidism- PTH 188, P 5.4, Ca 9.2 12/2008   Has poorly balanced diet    Hemodialysis-associated hypotension    coreg d/c'd   Herniated lumbar disc without myelopathy    History of blood transfusion    Hyperlipemia    Hyperparathyroidism due to renal insufficiency (HCC)    Hypertension    resloved after initating HD, now with post HD hypotension   Hypocalcemia    Hypomagnesemia    Kidney transplant as cause of abnormal reaction or later complication 2015   [facility]   Non-ischemic cardiomyopathy (HCC)    EF 20-25% 12/23/2008>>EF 40-45% 03/2009>>EF ??? 7/?/2011   Obese    Occult blood positive stool    Psoriasis    scalp   Thrombocytopenia (HCC)    Vitamin B 12 deficiency    Past Surgical History:  Procedure Laterality Date   AV FISTULA PLACEMENT     left   AV FISTULA PLACEMENT  09/12/2012   Procedure: INSERTION OF ARTERIOVENOUS (AV) GORE-TEX GRAFT ARM;  Surgeon: Sherren Kerns, MD;  Location: [facility] OR;  Service: Vascular;  Laterality: Left;  KIDNEY TRANSPLANT FAILED   BIOPSY  02/01/2019  Procedure: BIOPSY;  Surgeon: Kerin Salen, MD;  Location: [facility] ENDOSCOPY;  Service: Gastroenterology;;   CATARACT EXTRACTION, BILATERAL     COLONOSCOPY WITH PROPOFOL N/A 02/01/2019   Procedure: COLONOSCOPY WITH PROPOFOL;  Surgeon: Kerin Salen, MD;  Location: [facility] ENDOSCOPY;  Service: Gastroenterology;  Laterality: N/A;   EYE SURGERY     INSERTION OF DIALYSIS CATHETER  07/14/2012   Procedure: INSERTION OF DIALYSIS CATHETER;  Surgeon: Pryor Ochoa, MD;  Location: [facility] OR;  Service: Vascular;  Laterality: Right;  Ultrasound Guided Insertion of Internal Jugular Dialysis Catheter   IR THROMBECTOMY AV FISTULA W/THROMBOLYSIS/PTA INC/SHUNT/IMG LEFT Left  12/28/2018   IR US GUIDE VASC ACCESS LEFT  12/28/2018   KIDNEY TRANSPLANT  2013   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   KIDNEY TRANSPLANT  12/2013   second kidney transplant   lumbar disc sugery   10/17/2017   LUMBAR DISC SURGERY     NEPHRECTOMY TRANSPLANTED ORGAN  08/11/12   PHOTOCOAGULATION     for diabetic retinopathy 08/1999   SHUNTOGRAM N/A 07/11/2012   Procedure: Betsey Amen;  Surgeon: Nada Libman, MD;  Location: [facility] CATH LAB;  Service: Cardiovascular;  Laterality: N/A;     A IV Location/Drains/Wounds Patient Lines/Drains/Airways Status     Active Line/Drains/Airways     Name Placement date Placement time Site Days   Peripheral IV 06/09/23 20 G 1" Right Antecubital 06/09/23  1705  Antecubital  less than 1   Fistula / Graft Left Forearm Arteriovenous fistula --  --  Forearm  --   Incision Arm Left --  --  -- --   Pressure Injury 01/29/19 Buttocks Right Stage II -  Partial thickness loss of dermis presenting as a shallow open ulcer with a red, pink wound bed without slough. small, eraser-sized pink circles 01/29/19  1808  -- 1592   Pressure Injury 01/29/19 Buttocks Right Stage II -  Partial thickness loss of dermis presenting as a shallow open ulcer with a red, pink wound bed without slough. small, eraser-sized pink circle 01/29/19  1809  -- 1592   Pressure Injury 01/29/19 Buttocks Right Stage II -  Partial thickness loss of dermis presenting as a shallow open ulcer with a red, pink wound bed without slough. small, eraser-sized pink circle 01/29/19  1810  -- 1592   Wound / Incision (Open or Dehisced) 12/25/18 Non-pressure wound Buttocks Bilateral 12/25/18  0100  Buttocks  1627            Intake/Output Last 24 hours No intake or output data in the 24 hours ending 06/09/23 1741  Labs/Imaging Results for orders placed or performed during the hospital encounter of 06/09/23 (from the past 48 hour(s))  Urinalysis, Routine w reflex microscopic -Urine, Clean Catch     Status:  Abnormal   Collection Time: 06/09/23  1:25 PM  Result Value Ref Range   Color, Urine YELLOW YELLOW   APPearance HAZY (A) CLEAR   Specific Gravity, Urine 1.012 1.005 - 1.030   pH 5.0 5.0 - 8.0   Glucose, UA 50 (A) NEGATIVE mg/dL   Hgb urine dipstick MODERATE (A) NEGATIVE   Bilirubin Urine NEGATIVE NEGATIVE   Ketones, ur NEGATIVE NEGATIVE mg/dL   Protein, ur 161 (A) NEGATIVE mg/dL   Nitrite NEGATIVE NEGATIVE   Leukocytes,Ua SMALL (A) NEGATIVE   RBC / HPF 0-5 0 - 5 RBC/hpf   WBC, UA 0-5 0 - 5 WBC/hpf   Bacteria, UA NONE SEEN NONE SEEN   Squamous Epithelial / HPF 0-5 0 -  5 /HPF    Comment: Performed at [facility], [address] [place], [place] [address]  CBC with Differential     Status: Abnormal   Collection Time: 06/09/23  1:30 PM  Result Value Ref Range   WBC 9.7 4.0 - 10.5 K/uL   RBC 3.65 (L) 4.22 - 5.81 MIL/uL   Hemoglobin 10.7 (L) 13.0 - 17.0 g/dL   HCT 91.4 (L) 78.2 - 95.6 %   MCV 89.3 80.0 - 100.0 fL   MCH 29.3 26.0 - 34.0 pg   MCHC 32.8 30.0 - 36.0 g/dL   RDW 21.3 08.6 - 57.8 %   Platelets 79 (L) 150 - 400 K/uL    Comment: SPECIMEN CHECKED FOR CLOTS Immature Platelet Fraction may be clinically indicated, consider ordering this additional test [id number] REPEATED TO VERIFY PLATELET COUNT CONFIRMED BY SMEAR    nRBC 0.0 0.0 - 0.2 %   Neutrophils Relative % 76 %   Neutro Abs 7.3 1.7 - 7.7 K/uL   Lymphocytes Relative 17 %   Lymphs Abs 1.7 0.7 - 4.0 K/uL   Monocytes Relative 5 %   Monocytes Absolute 0.5 0.1 - 1.0 K/uL   Eosinophils Relative 2 %   Eosinophils Absolute 0.2 0.0 - 0.5 K/uL   Basophils Relative 0 %   Basophils Absolute 0.0 0.0 - 0.1 K/uL   Immature Granulocytes 0 %   Abs Immature Granulocytes 0.04 0.00 - 0.07 K/uL    Comment: Performed at [facility], [address], [place], [place] [address]  Comprehensive metabolic panel     Status: Abnormal   Collection Time: 06/09/23  1:30 PM  Result Value Ref Range   Sodium 140 135 - 145 mmol/L    Potassium 5.0 3.5 - 5.1 mmol/L   Chloride 110 98 - 111 mmol/L   CO2 10 (L) 22 - 32 mmol/L   Glucose, Bld 206 (H) 70 - 99 mg/dL    Comment: Glucose reference range applies only to samples taken after fasting for at least 8 hours.   BUN 172 (H) 6 - 20 mg/dL   Creatinine, Ser 44.01 (H) 0.61 - 1.24 mg/dL   Calcium 6.7 (L) 8.9 - 10.3 mg/dL   Total Protein 8.1 6.5 - 8.1 g/dL   Albumin 4.3 3.5 - 5.0 g/dL   AST 16 15 - 41 U/L   ALT 12 0 - 44 U/L   Alkaline Phosphatase 87 38 - 126 U/L   Total Bilirubin 0.7 0.3 - 1.2 mg/dL   GFR, Estimated 2 (L) >60 mL/min    Comment: (NOTE) Calculated using the CKD-EPI Creatinine Equation (2021)    Anion gap 20 (H) 5 - 15    Comment: Performed at [facility], [address] [place], [place] [address]  Lipase, blood     Status: Abnormal   Collection Time: 06/09/23  1:30 PM  Result Value Ref Range   Lipase 70 (H) 11 - 51 U/L    Comment: Performed at [facility], [address] [place], [place] [address]   No results found.  Pending Labs Unresulted Labs (From admission, onward)     Start     Ordered   06/10/23 0500  CBC  Tomorrow morning,   R        06/09/23 1735   06/10/23 0500  Renal function panel  Tomorrow morning,   R        06/09/23 1735   06/09/23 1731  HIV Antibody (routine  testing w rflx)  (HIV Antibody (Routine testing w reflex) panel)  Once,   R        06/09/23 1735   06/09/23 1606  Tacrolimus level  Once,   URGENT        06/09/23 1605            Vitals/Pain Today's Vitals   06/09/23 1306 06/09/23 1600 06/09/23 1630 06/09/23 1645  BP: (!) 174/90 (!) 166/90 (!) 162/93 (!) 162/80  Pulse: 97 86 87 84  Resp: 18 12 16 16   Temp: 98 F (36.7 C)     SpO2: 100% 100% 100% 100%  Weight:      Height:      PainSc:        Isolation Precautions No active isolations  Medications Medications  dapsone tablet 50 mg (has no administration in time range)  pantoprazole (PROTONIX) EC tablet 40 mg (has no administration in  time range)  tacrolimus (PROGRAF) capsule 3 mg (has no administration in time range)  Rena-Vite Rx TABS 1 tablet (has no administration in time range)  calcium carbonate (OSCAL) tablet 1,500 mg (has no administration in time range)  heparin injection 5,000 Units (has no administration in time range)  sodium chloride flush (NS) 0.9 % injection 3 mL (has no administration in time range)  acetaminophen (TYLENOL) tablet 650 mg (has no administration in time range)    Or  acetaminophen (TYLENOL) suppository 650 mg (has no administration in time range)  polyethylene glycol (MIRALAX / GLYCOLAX) packet 17 g (has no administration in time range)  prochlorperazine (COMPAZINE) injection 10 mg (10 mg Intravenous Given 06/09/23 1706)    Mobility walks     Focused Assessments    R Recommendations: See Admitting Provider Note  Report given to:   Additional Notes:  Pt a&ox4. Pt HD pt is suppose to go on THS. Pt has had renal transplant.

## 2023-06-09 NOTE — Consult Note (Signed)
Reason for Consult: To manage dialysis and dialysis related needs Referring Physician: Dr Samuel Jester is an 52 y.o. male.  HPI: Pt is a 79M with PMH of ESRd on HD MWF, failed transplant, HTN, DM II.  Supposed to go to dialysis at [facility].  Last rx was 9/21, has not had rx since.  He presented to HD unit today and was told to come to ED for labs and re-initiation of treatment.  He doesn't really provide a reason for not going.  He is willing to re-initiate dialysis.  BUN 172, Cr 22.06, CO2 10, CA 6.7  Notes n/v, shakiness, weight loss, and feeling very poorly.    Dialyzes at [facility]  Past Medical History:  Diagnosis Date   Acute renal failure superimposed on stage 5 chronic kidney disease, not on chronic dialysis (HCC) 11/29/2017   Adequate hearing function    Anemia    CKD (chronic kidney disease), stage III (HCC)    Diabetes mellitus 12/2008   A1C 8.3 12/23/08, 24hr urine protein 2793 mg/day, SPEP neg, proliferative BDR s/p photocoagulation 08/2009 done at [facility] and f/u by DR Groat   DKA (diabetic ketoacidosis) (HCC) 12/24/2018   IMO SNOMED Dx Update Oct 2024     ESRD (end stage renal disease) on dialysis (HCC)    1.Initiated HD via right per cate 12/30/2008 (MWF schedule at [employer] [facility]) 2.Left upper extremity A-V fistula by Dr Darrick Penna 12/30/2008 3.Aemia: Received Venofer 12/30/08 Ferritin 179 % sat 12 12/23/08, 4. Secondary Hyperparathyroidism- PTH 188, P 5.4, Ca 9.2 12/2008   Has poorly balanced diet    Hemodialysis-associated hypotension    coreg d/c'd   Herniated lumbar disc without myelopathy    History of blood transfusion    Hyperlipemia    Hyperparathyroidism due to renal insufficiency (HCC)    Hypertension    resloved after initating HD, now with post HD hypotension   Hypocalcemia    Hypomagnesemia    Kidney transplant as cause of abnormal reaction or later complication 2015   [facility]   Non-ischemic cardiomyopathy (HCC)    EF 20-25% 12/23/2008>>EF 40-45%  03/2009>>EF ??? 7/?/2011   Obese    Occult blood positive stool    Psoriasis    scalp   Thrombocytopenia (HCC)    Vitamin B 12 deficiency     Past Surgical History:  Procedure Laterality Date   AV FISTULA PLACEMENT     left   AV FISTULA PLACEMENT  09/12/2012   Procedure: INSERTION OF ARTERIOVENOUS (AV) GORE-TEX GRAFT ARM;  Surgeon: Sherren Kerns, MD;  Location: [facility] OR;  Service: Vascular;  Laterality: Left;  KIDNEY TRANSPLANT FAILED   BIOPSY  02/01/2019   Procedure: BIOPSY;  Surgeon: Kerin Salen, MD;  Location: [facility] ENDOSCOPY;  Service: Gastroenterology;;   CATARACT EXTRACTION, BILATERAL     COLONOSCOPY WITH PROPOFOL N/A 02/01/2019   Procedure: COLONOSCOPY WITH PROPOFOL;  Surgeon: Kerin Salen, MD;  Location: [facility] ENDOSCOPY;  Service: Gastroenterology;  Laterality: N/A;   EYE SURGERY     INSERTION OF DIALYSIS CATHETER  07/14/2012   Procedure: INSERTION OF DIALYSIS CATHETER;  Surgeon: Pryor Ochoa, MD;  Location: [facility] OR;  Service: Vascular;  Laterality: Right;  Ultrasound Guided Insertion of Internal Jugular Dialysis Catheter   IR THROMBECTOMY AV FISTULA W/THROMBOLYSIS/PTA INC/SHUNT/IMG LEFT Left 12/28/2018   IR US GUIDE VASC ACCESS LEFT  12/28/2018   KIDNEY TRANSPLANT  2013   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   KIDNEY TRANSPLANT  12/2013  second kidney transplant   lumbar disc sugery   10/17/2017   LUMBAR DISC SURGERY     NEPHRECTOMY TRANSPLANTED ORGAN  08/11/12   PHOTOCOAGULATION     for diabetic retinopathy 08/1999   SHUNTOGRAM N/A 07/11/2012   Procedure: Betsey Amen;  Surgeon: Nada Libman, MD;  Location: [facility] CATH LAB;  Service: Cardiovascular;  Laterality: N/A;    Family History  Problem Relation Age of Onset   Diabetes Father    Kidney disease Father    Other Father        amputation   Hypertension Mother    Dementia Mother    Diabetes Sister    Hypertension Sister    Diabetes Sister    Kidney disease Sister    Diabetes Sister    Hypertension Brother    Diabetes  Brother    CAD Neg Hx    Sudden Cardiac Death Neg Hx    Congestive Heart Failure Neg Hx    Colon cancer Neg Hx    Rectal cancer Neg Hx    Esophageal cancer Neg Hx    Liver cancer Neg Hx     Social History:  reports that he has never smoked. He has never used smokeless tobacco. He reports that he does not drink alcohol and does not use drugs.  Allergies:  Allergies  Allergen Reactions   Lactose Intolerance (Gi) Diarrhea    No whole milk!!   Other Other (See Comments)    Soy milk burns his tongue   Pollen Extract Other (See Comments)    Itching, watery eyes and sneezing.   Tape Rash    PLEASE USE COBAN WRAP Pt can't have paper tape    Medications: Scheduled:  calcium carbonate  1,250 mg Oral BID WC   dapsone  50 mg Oral Daily   heparin  5,000 Units Subcutaneous Q8H   [START ON 06/10/2023] influenza vac split trivalent PF  0.5 mL Intramuscular Tomorrow-1000   multivitamin  1 tablet Oral QHS   pantoprazole  40 mg Oral Daily   sodium chloride flush  3 mL Intravenous Q12H   tacrolimus  3 mg Oral BID    Results for orders placed or performed during the hospital encounter of 06/09/23 (from the past 48 hour(s))  Urinalysis, Routine w reflex microscopic -Urine, Clean Catch     Status: Abnormal   Collection Time: 06/09/23  1:25 PM  Result Value Ref Range   Color, Urine YELLOW YELLOW   APPearance HAZY (A) CLEAR   Specific Gravity, Urine 1.012 1.005 - 1.030   pH 5.0 5.0 - 8.0   Glucose, UA 50 (A) NEGATIVE mg/dL   Hgb urine dipstick MODERATE (A) NEGATIVE   Bilirubin Urine NEGATIVE NEGATIVE   Ketones, ur NEGATIVE NEGATIVE mg/dL   Protein, ur 829 (A) NEGATIVE mg/dL   Nitrite NEGATIVE NEGATIVE   Leukocytes,Ua SMALL (A) NEGATIVE   RBC / HPF 0-5 0 - 5 RBC/hpf   WBC, UA 0-5 0 - 5 WBC/hpf   Bacteria, UA NONE SEEN NONE SEEN   Squamous Epithelial / HPF 0-5 0 - 5 /HPF    Comment: Performed at [facility], [address], [place], [place] [address]  CBC with Differential      Status: Abnormal   Collection Time: 06/09/23  1:30 PM  Result Value Ref Range   WBC 9.7 4.0 - 10.5 K/uL   RBC 3.65 (L) 4.22 - 5.81 MIL/uL   Hemoglobin 10.7 (L) 13.0 - 17.0 g/dL   HCT 08.6 (  L) 39.0 - 52.0 %   MCV 89.3 80.0 - 100.0 fL   MCH 29.3 26.0 - 34.0 pg   MCHC 32.8 30.0 - 36.0 g/dL   RDW 42.5 95.6 - 38.7 %   Platelets 79 (L) 150 - 400 K/uL    Comment: SPECIMEN CHECKED FOR CLOTS Immature Platelet Fraction may be clinically indicated, consider ordering this additional test [id number] REPEATED TO VERIFY PLATELET COUNT CONFIRMED BY SMEAR    nRBC 0.0 0.0 - 0.2 %   Neutrophils Relative % 76 %   Neutro Abs 7.3 1.7 - 7.7 K/uL   Lymphocytes Relative 17 %   Lymphs Abs 1.7 0.7 - 4.0 K/uL   Monocytes Relative 5 %   Monocytes Absolute 0.5 0.1 - 1.0 K/uL   Eosinophils Relative 2 %   Eosinophils Absolute 0.2 0.0 - 0.5 K/uL   Basophils Relative 0 %   Basophils Absolute 0.0 0.0 - 0.1 K/uL   Immature Granulocytes 0 %   Abs Immature Granulocytes 0.04 0.00 - 0.07 K/uL    Comment: Performed at [facility], [address], [place], [place] [address]  Comprehensive metabolic panel     Status: Abnormal   Collection Time: 06/09/23  1:30 PM  Result Value Ref Range   Sodium 140 135 - 145 mmol/L   Potassium 5.0 3.5 - 5.1 mmol/L   Chloride 110 98 - 111 mmol/L   CO2 10 (L) 22 - 32 mmol/L   Glucose, Bld 206 (H) 70 - 99 mg/dL    Comment: Glucose reference range applies only to samples taken after fasting for at least 8 hours.   BUN 172 (H) 6 - 20 mg/dL   Creatinine, Ser 66.06 (H) 0.61 - 1.24 mg/dL   Calcium 6.7 (L) 8.9 - 10.3 mg/dL   Total Protein 8.1 6.5 - 8.1 g/dL   Albumin 4.3 3.5 - 5.0 g/dL   AST 16 15 - 41 U/L   ALT 12 0 - 44 U/L   Alkaline Phosphatase 87 38 - 126 U/L   Total Bilirubin 0.7 0.3 - 1.2 mg/dL   GFR, Estimated 2 (L) >60 mL/min    Comment: (NOTE) Calculated using the CKD-EPI Creatinine Equation (2021)    Anion gap 20 (H) 5 - 15    Comment: Performed at [facility], [address] [place], [place] [address]  Lipase, blood     Status: Abnormal   Collection Time: 06/09/23  1:30 PM  Result Value Ref Range   Lipase 70 (H) 11 - 51 U/L    Comment: Performed at [facility], [address] [place], [place] [address]    No results found.  ROS: all other systems reviewed and are negative except as per HPI  Blood pressure (!) 171/84, pulse 91, temperature 97.6 F (36.4 C), temperature source Oral, resp. rate 16, height 5\' 5"  (1.651 m), weight 69.6 kg, SpO2 100%. GEN  appears chronically ill HEENT EOMI PERRL NECK no JVD PULM clear CV RRR ABD  soft EXT no LE edema NEURO + asterixis SKIN dry and flaky ACCESS:  L forearm AVG + T/B  Assessment/Plan: 1 Uremia: hasn't been to dialysis in > 1 month.  Needs to re-initiate like a new start.  It sounds like he hasn't been discharged from his dialysis unit and can return when he's medically ready- will confirm in AM to be sure 2 ESRD: no dialysis in 1 month.  Will re-initiate like a new start 3 Hypertension:  UF  as tolerated 4. Anemia of ESRD:  Hgb 10.7, will check iron stores 5. Metabolic Bone Disease: Ca a little low, start TUMS at night and Vit D during dialysis 6.  Dispo: admitted  Bufford Buttner 06/09/2023, 8:01 PM

## 2023-06-09 NOTE — ED Provider Triage Note (Signed)
Emergency Medicine Provider Triage Evaluation Note  [name] , a 52 y.o. male  was evaluated in triage.  Pt complains of weakness.  Pt has kidney transplant but it's failing and currently on dialysis.  Has not gone to Dialysis since Sept 21st because he reportely felt unwell.  Having fatigue, nausea, vomiting.  No cp, sob.  Does make urine.  PCP encourage pt to come to the ER  Review of Systems  Positive: As above Negative: As above  Physical Exam  BP (!) 174/90 (BP Location: Right Arm)   Pulse 97   Temp 98 F (36.7 C)   Resp 18   Ht 5\' 5"  (1.651 m)   Wt 69.6 kg   SpO2 100%   BMI 25.53 kg/m  Gen:   Awake, no distress  tremulous Resp:  Normal effort  MSK:   Moves extremities without difficulty  Other:    Medical Decision Making  Medically screening exam initiated at 1:21 PM.  Appropriate orders placed.  [name] was informed that the remainder of the evaluation will be completed by another provider, this initial triage assessment does not replace that evaluation, and the importance of remaining in the ED until their evaluation is complete.     Fayrene Helper, PA-C 06/09/23 1327

## 2023-06-09 NOTE — ED Triage Notes (Signed)
Pt c/o N/Vx1wk. Pt dialysis Tues, Thurs, Sat. Per pt family member, pt hasn't been to dialysis since 05/07/23. Pt has been refusing to go, because he was too busy.

## 2023-06-10 ENCOUNTER — Observation Stay (HOSPITAL_COMMUNITY): Payer: Medicare Other

## 2023-06-10 DIAGNOSIS — Z833 Family history of diabetes mellitus: Secondary | ICD-10-CM | POA: Diagnosis not present

## 2023-06-10 DIAGNOSIS — Y830 Surgical operation with transplant of whole organ as the cause of abnormal reaction of the patient, or of later complication, without mention of misadventure at the time of the procedure: Secondary | ICD-10-CM | POA: Diagnosis present

## 2023-06-10 DIAGNOSIS — E739 Lactose intolerance, unspecified: Secondary | ICD-10-CM | POA: Diagnosis present

## 2023-06-10 DIAGNOSIS — Y712 Prosthetic and other implants, materials and accessory cardiovascular devices associated with adverse incidents: Secondary | ICD-10-CM | POA: Diagnosis present

## 2023-06-10 DIAGNOSIS — E785 Hyperlipidemia, unspecified: Secondary | ICD-10-CM | POA: Diagnosis present

## 2023-06-10 DIAGNOSIS — N179 Acute kidney failure, unspecified: Secondary | ICD-10-CM | POA: Diagnosis present

## 2023-06-10 DIAGNOSIS — Z7982 Long term (current) use of aspirin: Secondary | ICD-10-CM | POA: Diagnosis not present

## 2023-06-10 DIAGNOSIS — N186 End stage renal disease: Secondary | ICD-10-CM | POA: Diagnosis present

## 2023-06-10 DIAGNOSIS — E872 Acidosis, unspecified: Secondary | ICD-10-CM | POA: Diagnosis present

## 2023-06-10 DIAGNOSIS — E1122 Type 2 diabetes mellitus with diabetic chronic kidney disease: Secondary | ICD-10-CM | POA: Diagnosis present

## 2023-06-10 DIAGNOSIS — M898X9 Other specified disorders of bone, unspecified site: Secondary | ICD-10-CM | POA: Diagnosis present

## 2023-06-10 DIAGNOSIS — I871 Compression of vein: Secondary | ICD-10-CM | POA: Diagnosis present

## 2023-06-10 DIAGNOSIS — E8779 Other fluid overload: Secondary | ICD-10-CM | POA: Diagnosis present

## 2023-06-10 DIAGNOSIS — Z841 Family history of disorders of kidney and ureter: Secondary | ICD-10-CM | POA: Diagnosis not present

## 2023-06-10 DIAGNOSIS — I132 Hypertensive heart and chronic kidney disease with heart failure and with stage 5 chronic kidney disease, or end stage renal disease: Secondary | ICD-10-CM | POA: Diagnosis present

## 2023-06-10 DIAGNOSIS — Z992 Dependence on renal dialysis: Secondary | ICD-10-CM | POA: Diagnosis not present

## 2023-06-10 DIAGNOSIS — Z8249 Family history of ischemic heart disease and other diseases of the circulatory system: Secondary | ICD-10-CM | POA: Diagnosis not present

## 2023-06-10 DIAGNOSIS — T82858A Stenosis of vascular prosthetic devices, implants and grafts, initial encounter: Secondary | ICD-10-CM | POA: Diagnosis present

## 2023-06-10 DIAGNOSIS — T8612 Kidney transplant failure: Secondary | ICD-10-CM | POA: Diagnosis present

## 2023-06-10 DIAGNOSIS — Z91158 Patient's noncompliance with renal dialysis for other reason: Secondary | ICD-10-CM

## 2023-06-10 DIAGNOSIS — I428 Other cardiomyopathies: Secondary | ICD-10-CM | POA: Diagnosis present

## 2023-06-10 DIAGNOSIS — N19 Unspecified kidney failure: Secondary | ICD-10-CM

## 2023-06-10 DIAGNOSIS — Z23 Encounter for immunization: Secondary | ICD-10-CM | POA: Diagnosis present

## 2023-06-10 DIAGNOSIS — I5032 Chronic diastolic (congestive) heart failure: Secondary | ICD-10-CM | POA: Diagnosis present

## 2023-06-10 DIAGNOSIS — D631 Anemia in chronic kidney disease: Secondary | ICD-10-CM | POA: Diagnosis present

## 2023-06-10 DIAGNOSIS — R112 Nausea with vomiting, unspecified: Secondary | ICD-10-CM | POA: Diagnosis present

## 2023-06-10 DIAGNOSIS — N2581 Secondary hyperparathyroidism of renal origin: Secondary | ICD-10-CM | POA: Diagnosis present

## 2023-06-10 DIAGNOSIS — E11319 Type 2 diabetes mellitus with unspecified diabetic retinopathy without macular edema: Secondary | ICD-10-CM | POA: Diagnosis present

## 2023-06-10 HISTORY — PX: IR THROMBECTOMY AV FISTULA W/THROMBOLYSIS INC/SHUNT/IMG LEFT: IMG6105

## 2023-06-10 HISTORY — PX: IR THROMBECTOMY AV FISTULA W/THROMBOLYSIS/PTA INC/SHUNT/IMG LEFT: IMG6106

## 2023-06-10 LAB — CBC
HCT: 27.4 % — ABNORMAL LOW (ref 39.0–52.0)
HCT: 26.8 % — ABNORMAL LOW (ref 39.0–52.0)
Hemoglobin: 8.9 g/dL — ABNORMAL LOW (ref 13.0–17.0)
Hemoglobin: 9.1 g/dL — ABNORMAL LOW (ref 13.0–17.0)
MCH: 28.9 pg (ref 26.0–34.0)
MCH: 28.6 pg (ref 26.0–34.0)
MCHC: 32.5 g/dL (ref 30.0–36.0)
MCHC: 34 g/dL (ref 30.0–36.0)
MCV: 89 fL (ref 80.0–100.0)
MCV: 84.3 fL (ref 80.0–100.0)
Platelets: 68 10*3/uL — ABNORMAL LOW (ref 150–400)
Platelets: 61 10*3/uL — ABNORMAL LOW (ref 150–400)
RBC: 3.08 MIL/uL — ABNORMAL LOW (ref 4.22–5.81)
RBC: 3.18 MIL/uL — ABNORMAL LOW (ref 4.22–5.81)
RDW: 14.6 % (ref 11.5–15.5)
RDW: 14.3 % (ref 11.5–15.5)
WBC: 8.1 10*3/uL (ref 4.0–10.5)
WBC: 5.5 10*3/uL (ref 4.0–10.5)
nRBC: 0 % (ref 0.0–0.2)
nRBC: 0 % (ref 0.0–0.2)

## 2023-06-10 LAB — RENAL FUNCTION PANEL
Albumin: 3.6 g/dL (ref 3.5–5.0)
Anion gap: 17 — ABNORMAL HIGH (ref 5–15)
BUN: 107 mg/dL — ABNORMAL HIGH (ref 6–20)
CO2: 16 mmol/L — ABNORMAL LOW (ref 22–32)
Calcium: 6.8 mg/dL — ABNORMAL LOW (ref 8.9–10.3)
Chloride: 102 mmol/L (ref 98–111)
Creatinine, Ser: 14.62 mg/dL — ABNORMAL HIGH (ref 0.61–1.24)
GFR, Estimated: 4 mL/min — ABNORMAL LOW (ref >60)
Glucose, Bld: 168 mg/dL — ABNORMAL HIGH (ref 70–99)
Phosphorus: 5.2 mg/dL — ABNORMAL HIGH (ref 2.5–4.6)
Potassium: 3 mmol/L — ABNORMAL LOW (ref 3.5–5.1)
Sodium: 135 mmol/L (ref 135–145)

## 2023-06-10 LAB — GLUCOSE, CAPILLARY
Glucose-Capillary: 147 mg/dL — ABNORMAL HIGH (ref 70–99)
Glucose-Capillary: 165 mg/dL — ABNORMAL HIGH (ref 70–99)

## 2023-06-10 LAB — HEMOGLOBIN A1C
Hgb A1c MFr Bld: 6.9 % — ABNORMAL HIGH (ref 4.8–5.6)
Mean Plasma Glucose: 151.33 mg/dL

## 2023-06-10 MED ORDER — LIDOCAINE-PRILOCAINE 2.5-2.5 % EX CREA: 1.0000 | TOPICAL_CREAM | CUTANEOUS | Status: DC | PRN

## 2023-06-10 MED ORDER — LIDOCAINE HCL 1 % IJ SOLN
INTRAMUSCULAR | Status: AC
  Filled 2023-06-10: qty 20

## 2023-06-10 MED ORDER — IOHEXOL 300 MG/ML  SOLN: 150.0000 mL | Freq: Once | INTRAMUSCULAR | Status: DC | PRN

## 2023-06-10 MED ORDER — ANTICOAGULANT SODIUM CITRATE 4% (200MG/5ML) IV SOLN: 5.0000 mL | Status: DC | PRN

## 2023-06-10 MED ORDER — MIDAZOLAM HCL 2 MG/2ML IJ SOLN
INTRAMUSCULAR | Status: AC | PRN
Start: 2023-06-10 — End: 2023-06-10
  Administered 2023-06-10: 1 mg via INTRAVENOUS

## 2023-06-10 MED ORDER — IOHEXOL 300 MG/ML  SOLN: 100.0000 mL | Freq: Once | INTRAMUSCULAR | Status: DC | PRN

## 2023-06-10 MED ORDER — LIDOCAINE HCL (PF) 1 % IJ SOLN: 5.0000 mL | INTRAMUSCULAR | Status: DC | PRN

## 2023-06-10 MED ORDER — ALTEPLASE 2 MG IJ SOLR: 2.0000 mg | Freq: Once | INTRAMUSCULAR | Status: DC | PRN

## 2023-06-10 MED ORDER — PENTAFLUOROPROP-TETRAFLUOROETH EX AERO: 1.0000 | INHALATION_SPRAY | CUTANEOUS | Status: DC | PRN

## 2023-06-10 MED ORDER — HEPARIN SODIUM (PORCINE) 1000 UNIT/ML DIALYSIS
1000.0000 [IU] | INTRAMUSCULAR | Status: DC | PRN
  Administered 2023-06-10: 2000 [IU]

## 2023-06-10 MED ORDER — LIDOCAINE HCL 1 % IJ SOLN
20.0000 mL | Freq: Once | INTRAMUSCULAR | Status: AC
  Administered 2023-06-10: 5 mL
  Filled 2023-06-10: qty 20

## 2023-06-10 MED ORDER — IOHEXOL 300 MG/ML  SOLN
100.0000 mL | Freq: Once | INTRAMUSCULAR | Status: AC | PRN
  Administered 2023-06-10: 50 mL via INTRA_ARTERIAL

## 2023-06-10 MED ORDER — INSULIN ASPART 100 UNIT/ML IJ SOLN
0.0000 [IU] | Freq: Three times a day (TID) | INTRAMUSCULAR | Status: DC
  Administered 2023-06-11 – 2023-06-12 (×2): 2 [IU] via SUBCUTANEOUS
  Administered 2023-06-12: 1 [IU] via SUBCUTANEOUS

## 2023-06-10 MED ORDER — HYDRALAZINE HCL 20 MG/ML IJ SOLN
5.0000 mg | INTRAMUSCULAR | Status: DC | PRN
Start: 2023-06-10 — End: 2023-06-12

## 2023-06-10 MED ORDER — MIDAZOLAM HCL 2 MG/2ML IJ SOLN
INTRAMUSCULAR | Status: AC
  Filled 2023-06-10: qty 2

## 2023-06-10 MED ORDER — HEPARIN SODIUM (PORCINE) 1000 UNIT/ML DIALYSIS: 1000.0000 [IU] | INTRAMUSCULAR | Status: DC | PRN

## 2023-06-10 NOTE — Consult Note (Signed)
Chief Complaint: High pressures in graft during dialysis  Referring Provider(s): Anthony Sar  Supervising Physician: Mir, Mauri Reading  Patient Status: [facility] - In-pt  History of Present Illness: [name] is a 52 y.o. male with medical issues including hypertension, hyperlipidemia, diabetes, ESRD with history of failed renal transplant x 2, anemia, CHF, QT prolongation.  He presented to the ED yesterday with nausea vomiting in the setting of missed HD sessions.   Per chart, it appears he has not gone to dialysis since 05/07/2023.  In the ED his Creatinine is 22 and BUN was 172.   He was admitted for uremia and   The last intervention on this AV graft was back in May of 2020 by Dr. Grace Isaac. His op note reads = 1. Technically successful left forearm AV graft thrombolysis. 2. Successful angioplasty of the venous anastomosis and majority of the venous limb to 6 mm diameter. Completion shuntogram demonstrates the venous limb and anastomosis are widely patent. 3. The arterial anastomosis and arterial limb are widely patent. 4. The central venous system is widely patent.   Patient reports there is prolonged bleeding after diaysis.  He is Not NPO so will use only one medication.  Patient is Full Code  Past Medical History:  Diagnosis Date   Acute renal failure superimposed on stage 5 chronic kidney disease, not on chronic dialysis (HCC) 11/29/2017   Adequate hearing function    Anemia    CKD (chronic kidney disease), stage III (HCC)    Diabetes mellitus 12/2008   A1C 8.3 12/23/08, 24hr urine protein 2793 mg/day, SPEP neg, proliferative BDR s/p photocoagulation 08/2009 done at [facility] and f/u by DR Groat   DKA (diabetic ketoacidosis) (HCC) 12/24/2018   IMO SNOMED Dx Update Oct 2024     ESRD (end stage renal disease) on dialysis (HCC)    1.Initiated HD via right per cate 12/30/2008 (MWF schedule at [facility]) 2.Left upper extremity A-V fistula by Dr Darrick Penna 12/30/2008  3.Aemia: Received Venofer 12/30/08 Ferritin 179 % sat 12 12/23/08, 4. Secondary Hyperparathyroidism- PTH 188, P 5.4, Ca 9.2 12/2008   Has poorly balanced diet    Hemodialysis-associated hypotension    coreg d/c'd   Herniated lumbar disc without myelopathy    History of blood transfusion    Hyperlipemia    Hyperparathyroidism due to renal insufficiency (HCC)    Hypertension    resloved after initating HD, now with post HD hypotension   Hypocalcemia    Hypomagnesemia    Kidney transplant as cause of abnormal reaction or later complication 2015   [facility]   Non-ischemic cardiomyopathy (HCC)    EF 20-25% 12/23/2008>>EF 40-45% 03/2009>>EF ??? 7/?/2011   Obese    Occult blood positive stool    Psoriasis    scalp   Thrombocytopenia (HCC)    Vitamin B 12 deficiency     Past Surgical History:  Procedure Laterality Date   AV FISTULA PLACEMENT     left   AV FISTULA PLACEMENT  09/12/2012   Procedure: INSERTION OF ARTERIOVENOUS (AV) GORE-TEX GRAFT ARM;  Surgeon: Sherren Kerns, MD;  Location: [facility] OR;  Service: Vascular;  Laterality: Left;  KIDNEY TRANSPLANT FAILED   BIOPSY  02/01/2019   Procedure: BIOPSY;  Surgeon: Kerin Salen, MD;  Location: [facility] ENDOSCOPY;  Service: Gastroenterology;;   CATARACT EXTRACTION, BILATERAL     COLONOSCOPY WITH PROPOFOL N/A 02/01/2019   Procedure: COLONOSCOPY WITH PROPOFOL;  Surgeon: Kerin Salen, MD;  Location: [facility] ENDOSCOPY;  Service: Gastroenterology;  Laterality: N/A;   EYE SURGERY     INSERTION OF DIALYSIS CATHETER  07/14/2012   Procedure: INSERTION OF DIALYSIS CATHETER;  Surgeon: Pryor Ochoa, MD;  Location: [facility] OR;  Service: Vascular;  Laterality: Right;  Ultrasound Guided Insertion of Internal Jugular Dialysis Catheter   IR THROMBECTOMY AV FISTULA W/THROMBOLYSIS/PTA INC/SHUNT/IMG LEFT Left 12/28/2018   IR US GUIDE VASC ACCESS LEFT  12/28/2018   KIDNEY TRANSPLANT  2013   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   KIDNEY TRANSPLANT  12/2013   second kidney  transplant   lumbar disc sugery   10/17/2017   LUMBAR DISC SURGERY     NEPHRECTOMY TRANSPLANTED ORGAN  08/11/12   PHOTOCOAGULATION     for diabetic retinopathy 08/1999   SHUNTOGRAM N/A 07/11/2012   Procedure: Betsey Amen;  Surgeon: Nada Libman, MD;  Location: [facility] CATH LAB;  Service: Cardiovascular;  Laterality: N/A;    Allergies: Lactose intolerance (gi), Other, Pollen extract, and Tape  Medications: Prior to Admission medications   Medication Sig Start Date End Date Taking? Authorizing Provider  acetaminophen (TYLENOL) 500 MG tablet Take 500 mg by mouth every 6 (six) hours as needed for mild pain (pain score 1-3).   Yes [provider]  aspirin EC 81 MG tablet Take 81 mg by mouth daily.   Yes [provider]  B Complex-C-Folic Acid (RENA-VITE RX) 1 MG TABS Take 1 tablet by mouth daily. 06/12/21  Yes [provider]  calcium carbonate (OSCAL) 1500 (600 Ca) MG TABS tablet Take 1,500 mg by mouth 2 (two) times daily with a meal.   Yes [provider]  calcium carbonate (TUMS SMOOTHIES) 750 MG chewable tablet Chew 1 tablet by mouth as needed (heartburn, dialysis). 04/17/19  Yes [provider]  dapsone 25 MG tablet Take 50 mg by mouth daily.   Yes [provider]  omeprazole (PRILOSEC) 20 MG capsule Take 1 capsule (20 mg total) by mouth daily. 12/20/17  Yes Sharon Seller, NP  tacrolimus (PROGRAF) 1 MG capsule Take 3 capsules (3 mg total) by mouth 2 (two) times daily. 02/05/19  Yes Dahal, Melina Schools, MD  Accu-Chek FastClix Lancets MISC 1 each by In Vitro route 4 (four) times daily. DX:E11.9 02/13/19   Sharon Seller, NP  glucose blood (ACCU-CHEK AVIVA PLUS) test strip 1 each by Other route 4 (four) times daily. DX:E11.9 02/13/19   Sharon Seller, NP     Family History  Problem Relation Age of Onset   Diabetes Father    Kidney disease Father    Other Father        amputation   Hypertension Mother    Dementia Mother    Diabetes  Sister    Hypertension Sister    Diabetes Sister    Kidney disease Sister    Diabetes Sister    Hypertension Brother    Diabetes Brother    CAD Neg Hx    Sudden Cardiac Death Neg Hx    Congestive Heart Failure Neg Hx    Colon cancer Neg Hx    Rectal cancer Neg Hx    Esophageal cancer Neg Hx    Liver cancer Neg Hx     Social History   Socioeconomic History   Marital status: Married    Spouse name: Not on file   Number of children: 2   Years of education: Not on file   Highest education level: Not on file  Occupational History   Occupation: disabled  Tobacco Use  Smoking status: Never   Smokeless tobacco: Never  Vaping Use   Vaping status: Never Used  Substance and Sexual Activity   Alcohol use: No    Alcohol/week: 0.0 standard drinks of alcohol   Drug use: No   Sexual activity: Not on file  Other Topics Concern   Not on file  Social History Narrative   Social History      Diet?       Do you drink/eat things with caffeine? yes      Marital status?            married                        What year were you married?       Do you live in a house, apartment, assisted living, condo, trailer, etc.? apartment      Is it one or more stories? one      How many persons live in your home? 2      Do you have any pets in your home? (please list) no      Highest level of education completed?       Current or past profession: bus owner      Do you exercise?            yes                          Type & how often? Walking daily      Advanced Directives      Do you have a living will? no      Do you have a DNR form?            no                      If not, do you want to discuss one?      Do you have signed POA/HPOA for forms? no      Functional Status      Do you have difficulty bathing or dressing yourself? yes      Do you have difficulty preparing food or eating? no      Do you have difficulty managing your medications? yes      Do you have  difficulty managing your finances? no      Do you have difficulty affording your medications? yes   Social Determinants of Health   Financial Resource Strain: Medium Risk (01/27/2018)   Overall Financial Resource Strain (CARDIA)    Difficulty of Paying Living Expenses: Somewhat hard  Food Insecurity: No Food Insecurity (06/09/2023)   Hunger Vital Sign    Worried About Running Out of Food in the Last Year: Never true    Ran Out of Food in the Last Year: Never true  Transportation Needs: No Transportation Needs (06/09/2023)   PRAPARE - Administrator, Civil Service (Medical): No    Lack of Transportation (Non-Medical): No  Physical Activity: Insufficiently Active (01/27/2018)   Exercise Vital Sign    Days of Exercise per Week: 4 days    Minutes of Exercise per Session: 30 min  Stress: No Stress Concern Present (01/27/2018)   [employer] of Occupational Health - Occupational Stress Questionnaire    Feeling of Stress : Not at all  Social Connections: Moderately Integrated (01/27/2018)   Social Connection and Isolation Panel [NHANES]    Frequency of  Communication with Friends and Family: More than three times a week    Frequency of Social Gatherings with Friends and Family: More than three times a week    Attends Religious Services: More than 4 times per year    Active Member of [employer] or Organizations: No    Attends Banker Meetings: Never    Marital Status: Married     Review of Systems: A 12 point ROS discussed and pertinent positives are indicated in the HPI above.  All other systems are negative.   Vital Signs: BP (!) 169/103   Pulse 94   Temp 98.2 F (36.8 C) (Oral) Comment: PRE TREATMENT  Resp 14   Ht 5\' 5"  (1.651 m)   Wt 145 lb 1 oz (65.8 kg)   SpO2 99%   BMI 24.14 kg/m   Advance Care Plan: The advanced care place/surrogate decision maker was discussed at the time of visit and the patient did not wish to discuss or was not able to name a  surrogate decision maker or provide an advance care plan.  Physical Exam Constitutional:      Appearance: Normal appearance.  HENT:     Head: Normocephalic and atraumatic.  Cardiovascular:     Rate and Rhythm: Normal rate and regular rhythm.  Pulmonary:     Effort: Pulmonary effort is normal. No respiratory distress.     Breath sounds: Normal breath sounds.  Musculoskeletal:     Comments: Left forearm AV graft with good audible bruit. No change in pitch.  Skin:    General: Skin is warm and dry.  Neurological:     General: No focal deficit present.     Mental Status: He is alert and oriented to person, place, and time.  Psychiatric:        Mood and Affect: Mood normal.        Behavior: Behavior normal.        Thought Content: Thought content normal.        Judgment: Judgment normal.     Imaging: No results found.  Labs:  CBC: Recent Labs    06/09/23 1330 06/10/23 0855  WBC 9.7 8.1  HGB 10.7* 8.9*  HCT 32.6* 27.4*  PLT 79* 68*    COAGS: No results for input(s): "INR", "APTT" in the last 8760 hours.  BMP: Recent Labs    06/09/23 1330  NA 140  K 5.0  CL 110  CO2 10*  GLUCOSE 206*  BUN 172*  CALCIUM 6.7*  CREATININE 22.06*  GFRNONAA 2*    LIVER FUNCTION TESTS: Recent Labs    06/09/23 1330  BILITOT 0.7  AST 16  ALT 12  ALKPHOS 87  PROT 8.1  ALBUMIN 4.3    TUMOR MARKERS: No results for input(s): "AFPTM", "CEA", "CA199", "CHROMGRNA" in the last 8760 hours.  Assessment and Plan:   ESRD via left forearm AV graft.  Prolonged bleeding and high venous pressures during dialysis.  Will proceed with shuntogram and possible angioplasty today by Dr. Bryn Gulling.  Risks and benefits of the dialysis circuit study were discussed with the patient including, but not limited to bleeding, infection, vascular injury, and inability to improve the function of the fistula/graft which would require placement of a tunneled dialysis catheter.  All of the patient's  questions were answered, patient is agreeable to proceed. Consent signed and in chart.  Thank you for allowing our service to participate in Shamier Dombeck 's care.  Electronically Signed: Gwynneth Macleod, PA-C  06/10/2023, 10:57 AM      I spent a total of 20 Minutes  in face to face in clinical consultation, greater than 50% of which was counseling/coordinating care for shuntogram.

## 2023-06-10 NOTE — Procedures (Signed)
Interventional Radiology Procedure Note  Procedure: Fistulagram with venoplasty  Indication: Thrombosed fistula  Findings: Please refer to procedural dictation for full description.  Complications: None  EBL: < 10 mL  Acquanetta Belling, MD [phone]

## 2023-06-10 NOTE — Progress Notes (Addendum)
Received patient in bed to unit.  Alert and oriented.  Informed consent signed and in chart.   TX duration:2  Patient tolerated well.  Transported back to the room  Alert, without acute distress.  Hand-off given to patient's nurse.   Access used: AVG Access issues: Staff had issues accessing the graft. Multiple lumps throughout the site. Difficulty getting access to keep blood flowing. MD Thedore Mins notified.   Total UF removed: 0 Medication(s) given: n/a Post HD weight: 65.8kg     [facility]

## 2023-06-10 NOTE — Progress Notes (Signed)
  [facility] Progress Note    Assessment/ Plan:   1 Uremia: hasn't been to dialysis in > 1 month.  re-initiated HD--slow start protocol. HD#1 today, #2 tomorrow. Hasn't been discharged from his outpatient HD unit.  2 ESRD: outpatient HD [facility]---no dialysis in 1 month.  Will re-initiate like a new start, HD#1 today, #2 tomorrow 3 Hypertension:  UF as tolerated 4. Anemia of ESRD:  Hgb 10.7, iron replete on panel. ESA once hgb <10 5. Metabolic Bone Disease: Ca a little low, started on TUMS at night and Vit D during dialysis. Monitor po4 6. Access: issues with cannulation and arterial pressures. Will consult IR for fgram  Dispo: admitted  Subjective:   Patient seen and examined on HD. Tolerating treatment, no complaints. Per staff, took 2 staff members to cannulate. Having arterial pressures alarm.   Objective:   BP (!) 169/72   Pulse 93   Temp 98.2 F (36.8 C) (Oral) Comment: PRE TREATMENT  Resp 14   Ht 5\' 5"  (1.651 m)   Wt 65.1 kg   SpO2 100%   BMI 23.88 kg/m  No intake or output data in the 24 hours ending 06/10/23 0957 Weight change:   Physical Exam: Gen: NAD, laying flat in bed CVS: RRR Resp: cta bl Abd: soft, nt/nd Ext: no sig edema b/l Les Neuro: awake, alert Dialysis access: LUE AVG in use  Imaging: No results found.  Labs: BMET Recent Labs  Lab 06/09/23 1330  NA 140  K 5.0  CL 110  CO2 10*  GLUCOSE 206*  BUN 172*  CREATININE 22.06*  CALCIUM 6.7*   CBC Recent Labs  Lab 06/09/23 1330  WBC 9.7  NEUTROABS 7.3  HGB 10.7*  HCT 32.6*  MCV 89.3  PLT 79*    Medications:     calcium carbonate  1,250 mg Oral BID WC   Chlorhexidine Gluconate Cloth  6 each Topical Q0600   dapsone  50 mg Oral Daily   heparin  5,000 Units Subcutaneous Q8H   influenza vac split trivalent PF  0.5 mL Intramuscular Tomorrow-1000   multivitamin  1 tablet Oral QHS   pantoprazole  40 mg Oral Daily   sodium chloride flush  3 mL Intravenous Q12H   tacrolimus   3 mg Oral BID      Anthony Sar, MD [facility] 06/10/2023, 9:57 AM

## 2023-06-10 NOTE — Hospital Course (Signed)
52yo with h/o ESRD s/p failed renal transplant x 2 and no HD in > 1 month, HTN, HLD, and DM who presented on 10/24 due to missed HD.  He restarted HD at the beginning of 2024 but has been "too busy" to go.  Nephrology consulted and he will need re-initiation of HD.

## 2023-06-10 NOTE — Plan of Care (Signed)

## 2023-06-10 NOTE — Progress Notes (Signed)
Contacted [facility] and spoke to Tax adviser. Pt can resume at clinic at d/c. Pt's schedule will remain the same- TTS 12:00 chair time. Will add arrangements to AVS. Update provided to nephrologist and renal PA. Will assist as needed.   Olivia Canter Renal Navigator [phone]

## 2023-06-10 NOTE — Plan of Care (Signed)

## 2023-06-10 NOTE — Progress Notes (Signed)
Progress Note   Patient: [name] QVZ:[id number] DOB: 08/22/1970 DOA: 06/09/2023     0 DOS: the patient was seen and examined on 06/10/2023   Brief hospital course: 52yo with h/o ESRD s/p failed renal transplant x 2 and no HD in > 1 month, HTN, HLD, and DM who presented on 10/24 due to missed HD.  He restarted HD at the beginning of 2024 but has been "too busy" to go.  Nephrology consulted and he will need re-initiation of HD.  Assessment and Plan:  ESRD with volume overload needing HD -Patient on chronic TTS HD -Nephrology prn order set utilized -He has missed HD for a month and is presenting with volume overload including uremia and high anion gap metabolic acidosis -The vast majority of his issues will likely be improved/resolved with HD  -Nephrology is consulting and has ordered HD -He will need serial HD, at least today and tomorrow -There were access issues during today's session and he was able to have ultrafiltration but not volume removal -He is going to IR for fistulogram today -He is able to return to his HD center on TTS at 12pm chair time after discharge -Continue Renavite   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 - it is difficult to keep the patient at an appropriate volume status if he does not go to HD -Ongoing counseling encouraged  S/p failed renal transplant -He has failed transplant x 2 -He remains on tacrolimus and dapsone   HTN -He is not taking home medications for this issue -HTN control is likely to improve post-HD -Will cover with prn IV hydralazine  DM -He does not appear to be taking medications for this issue at this time  -No recent A1c, will check -Glucose 206 on presentation -Will add very sensitive-scale SSI  HLD -He does not appear to be taking medications for this issue at this time     Consultants: Nephrology IR  Procedures: HD 10/25, 10/26 Fistulogram 10/26  Antibiotics: None      Subjective: Reports not feeling  well so he didn't go to HD for the last month.  No specific complaints at this time.  Not SOB, on RA.   Objective: Vitals:   06/10/23 0553 06/10/23 0749  BP: (!) 184/93 (!) 153/85  Pulse: 86 88  Resp: 20 18  Temp: 98 F (36.7 C) 98.3 F (36.8 C)  SpO2: 100% 100%   No intake or output data in the 24 hours ending 06/10/23 0826 Filed Weights   06/09/23 1305 06/10/23 0553  Weight: 69.6 kg 65.1 kg    Exam:  General:  Appears calm and comfortable and is in NAD Eyes:  EOMI, normal lids, iris ENT:  grossly normal hearing, lips & tongue, mmm Neck:  no LAD, masses or thyromegaly Cardiovascular:  RRR, no m/r/g. No LE edema.  Respiratory:   CTA bilaterally with no wheezes/rales/rhonchi.  Normal respiratory effort. Abdomen:  soft, NT, ND Skin:  no rash or induration seen on limited exam Musculoskeletal:  grossly normal tone BUE/BLE, good ROM, no bony abnormality Psychiatric:  grossly normal mood and affect, speech fluent and appropriate, AOx3 Neurologic:  CN 2-12 grossly intact, moves all extremities in coordinated fashion  Data Reviewed: I have reviewed the patient's lab results since admission.  Pertinent labs for today include:   Not back yet     Family Communication: None present; he is capable of communicating with family at this time  Disposition: Status is: Inpatient Admit - It is my clinical  opinion that admission to INPATIENT is reasonable and necessary because of the expectation that this patient will require hospital care that crosses at least 2 midnights to treat this condition based on the medical complexity of the problems presented.  Given the aforementioned information, the predictability of an adverse outcome is felt to be significant.      Time spent: 50 minutes  Unresulted Labs (From admission, onward)     Start     Ordered   06/10/23 0728  Renal function panel  Once,   R        06/10/23 0727   06/10/23 0728  CBC  Once,   R        06/10/23 0727    06/10/23 0500  CBC  Tomorrow morning,   R        06/09/23 1735   06/10/23 0500  Renal function panel  Tomorrow morning,   R        06/09/23 1735   06/09/23 2011  Hepatitis B surface antibody,quantitative  (New Admission Hemo Labs (Hepatitis B))  Once,   R        06/09/23 2011   06/09/23 2010  PTH, intact and calcium  Add-on,   AD        06/09/23 2010   06/09/23 1606  Tacrolimus level  Once,   URGENT        06/09/23 1605             Author: Jonah Blue, MD 06/10/2023 8:26 AM  For on call review [email or web address].

## 2023-06-11 DIAGNOSIS — N19 Unspecified kidney failure: Secondary | ICD-10-CM | POA: Diagnosis not present

## 2023-06-11 LAB — GLUCOSE, CAPILLARY
Glucose-Capillary: 203 mg/dL — ABNORMAL HIGH (ref 70–99)
Glucose-Capillary: 205 mg/dL — ABNORMAL HIGH (ref 70–99)
Glucose-Capillary: 222 mg/dL — ABNORMAL HIGH (ref 70–99)
Glucose-Capillary: 141 mg/dL — ABNORMAL HIGH (ref 70–99)

## 2023-06-11 LAB — CBC WITH DIFFERENTIAL/PLATELET
Abs Immature Granulocytes: 0.03 10*3/uL (ref 0.00–0.07)
Basophils Absolute: 0 10*3/uL (ref 0.0–0.1)
Basophils Relative: 0 %
Eosinophils Absolute: 0.2 10*3/uL (ref 0.0–0.5)
Eosinophils Relative: 3 %
HCT: 28.5 % — ABNORMAL LOW (ref 39.0–52.0)
Hemoglobin: 9.6 g/dL — ABNORMAL LOW (ref 13.0–17.0)
Immature Granulocytes: 0 %
Lymphocytes Relative: 29 %
Lymphs Abs: 2.2 10*3/uL (ref 0.7–4.0)
MCH: 28.8 pg (ref 26.0–34.0)
MCHC: 33.7 g/dL (ref 30.0–36.0)
MCV: 85.6 fL (ref 80.0–100.0)
Monocytes Absolute: 0.6 10*3/uL (ref 0.1–1.0)
Monocytes Relative: 8 %
Neutro Abs: 4.4 10*3/uL (ref 1.7–7.7)
Neutrophils Relative %: 60 %
Platelets: 70 10*3/uL — ABNORMAL LOW (ref 150–400)
RBC: 3.33 MIL/uL — ABNORMAL LOW (ref 4.22–5.81)
RDW: 14.3 % (ref 11.5–15.5)
WBC: 7.4 10*3/uL (ref 4.0–10.5)
nRBC: 0 % (ref 0.0–0.2)

## 2023-06-11 LAB — BASIC METABOLIC PANEL
Anion gap: 15 (ref 5–15)
BUN: 112 mg/dL — ABNORMAL HIGH (ref 6–20)
CO2: 20 mmol/L — ABNORMAL LOW (ref 22–32)
Calcium: 6.9 mg/dL — ABNORMAL LOW (ref 8.9–10.3)
Chloride: 101 mmol/L (ref 98–111)
Creatinine, Ser: 16.07 mg/dL — ABNORMAL HIGH (ref 0.61–1.24)
GFR, Estimated: 3 mL/min — ABNORMAL LOW (ref >60)
Glucose, Bld: 165 mg/dL — ABNORMAL HIGH (ref 70–99)
Potassium: 3.7 mmol/L (ref 3.5–5.1)
Sodium: 136 mmol/L (ref 135–145)

## 2023-06-11 MED ORDER — HEPARIN SODIUM (PORCINE) 1000 UNIT/ML IJ SOLN
INTRAMUSCULAR | Status: AC
  Administered 2023-06-11: 2000 [IU]
  Filled 2023-06-11: qty 2

## 2023-06-11 MED ORDER — DARBEPOETIN ALFA 60 MCG/0.3ML IJ SOSY
60.0000 ug | PREFILLED_SYRINGE | INTRAMUSCULAR | Status: DC
  Administered 2023-06-11: 60 ug via SUBCUTANEOUS
  Filled 2023-06-11: qty 0.3

## 2023-06-11 MED ORDER — LORAZEPAM 2 MG/ML IJ SOLN
1.0000 mg | Freq: Once | INTRAMUSCULAR | Status: AC
  Administered 2023-06-11: 1 mg via INTRAVENOUS
  Filled 2023-06-11: qty 1

## 2023-06-11 MED ORDER — METOCLOPRAMIDE HCL 5 MG/ML IJ SOLN
10.0000 mg | Freq: Once | INTRAMUSCULAR | Status: AC
  Administered 2023-06-11: 10 mg via INTRAVENOUS
  Filled 2023-06-11: qty 2

## 2023-06-11 NOTE — Progress Notes (Addendum)
Received patient in bed to unit.  Alert and oriented x4 Informed consent signed and in chart.   TX duration:3 hours  Patient ha\d episode of hypotension goal was lowered . Had episode of N/Vx2 Transported back to the room  Alert, without acute distress.  Hand-off given to patient's nurse.   Access used: AVG Access issues: none  Total UF removed: Medication(s) given: reglan 10mg , heprarin bolus 2000 units Post HD VS: see table below Post HD weight: 62.1kg    06/11/23 2236  Vitals  Temp 98.4 F (36.9 C)  Temp Source Oral  BP 123/68  MAP (mmHg) 84  BP Location Right Arm  BP Method Automatic  Patient Position (if appropriate) Lying  Pulse Rate (!) 106  Pulse Rate Source Monitor  ECG Heart Rate (!) 105  Resp 19  Oxygen Therapy  SpO2 97 %  O2 Device Room Air  Patient Activity (if Appropriate) In bed  Pulse Oximetry Type Continuous  During Treatment Monitoring  Blood Flow Rate (mL/min) 0 mL/min  Arterial Pressure (mmHg) 0 mmHg  Venous Pressure (mmHg) -0.4 mmHg  TMP (mmHg) -50.91 mmHg  Ultrafiltration Rate (mL/min) 0 mL/min  Dialysate Flow Rate (mL/min) 299 ml/min  Dialysate Potassium Concentration 3  Dialysate Calcium Concentration 2.5  Duration of HD Treatment -hour(s) 3 hour(s)  Cumulative Fluid Removed (mL) per Treatment  682.95  Intra-Hemodialysis Comments Tolerated well (post rinseback)  Post Treatment  Dialyzer Clearance Lightly streaked  Hemodialysis Intake (mL) 0 mL  Liters Processed 63  Fluid Removed (mL) 700 mL  Tolerated HD Treatment No (Comment)  Post-Hemodialysis Comments goal not met  AVG/AVF Arterial Site Held (minutes) 5 minutes  AVG/AVF Venous Site Held (minutes) 5 minutes  Fistula / Graft Left Forearm Arteriovenous fistula  No Placement Date or Time found.   Placed prior to admission: Yes  Orientation: Left  Access Location: Forearm  Access Type: Arteriovenous fistula  Site Condition No complications  Fistula / Graft Assessment  Present;Bruit  Status Deaccessed;Flushed;Patent      [facility]

## 2023-06-11 NOTE — Progress Notes (Signed)
[facility] Progress Note    Assessment/ Plan:   1 Uremia: hasn't been to dialysis in > 1 month.  re-initiated HD--slow start protocol as below. Hasn't been discharged from his outpatient HD unit therefore still has a chair 2 ESRD: outpatient HD [facility]---no dialysis in 1 month.  re-initiated like a new start, HD#1 10/25, #2 today 3 Hypertension:  UF as tolerated 4. Anemia of ESRD:  Hgb 9.6, iron replete on panel. Will dose ESA for today 5. Metabolic Bone Disease: Ca a little low, started on TUMS at night and Vit D during dialysis. Po4 5.2-acceptable 6. Access: issues with cannulation and arterial pressures. 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 Discussed with primary service.  Anthony Sar, MD  [facility]   Subjective:   Patient seen and examined bedside. Tolerated HD yesterday. Underwent fgram yesterday with IR No complaints this am Open for HD today   Objective:   BP (!) 150/76 (BP Location: Left Arm)   Pulse 100   Temp 98 F (36.7 C)   Resp 18   Ht 5\' 5"  (1.651 m)   Wt 64.2 kg   SpO2 97%   BMI 23.55 kg/m   Intake/Output Summary (Last 24 hours) at 06/11/2023 0947 Last data filed at 06/10/2023 2103 Gross per 24 hour  Intake 3 ml  Output 0 ml  Net 3 ml   Weight change: -3.8 kg  Physical Exam: Gen: NAD, laying flat in bed CVS: RRR Resp: cta bl Abd: soft, nt/nd Ext: no sig edema b/l Les Neuro: awake, alert Dialysis access: LUE AVG +b/t  Imaging: IR THROMBECTOMY AV FISTULA W/THROMBOLYSIS INC/SHUNT/IMG LEFT  Result Date: 06/10/2023 INDICATION: 52 year old gentleman with left forearm loop graft returns to IR for fistulogram and possible intervention given  recent poor flow during dialysis. EXAM: Left forearm fistulagram with venoplasty MEDICATIONS: Versed 1 mg IV ANESTHESIA/SEDATION: None FLUOROSCOPY: Radiation Exposure Index (as provided by the fluoroscopic device): 41 mGy Kerma COMPLICATIONS: None immediate. PROCEDURE: Informed written consent was obtained from the patient after a thorough discussion of the procedural risks, benefits and alternatives. All questions were addressed. Maximal Sterile Barrier Technique was utilized including caps, mask, sterile gowns, sterile gloves, sterile drape, hand hygiene and skin antiseptic. A timeout was performed prior to the initiation of the procedure. Sonographic evaluation of the proximal segment of the graft showed a dilated segment with partially thrombosed pseudoaneurysm which measured approximately 1.8 x 1.0 cm. Sonographic image documenting patency of the graft was obtained and placed in permanent medical record. Following local administration, 21 gauge needle was utilized to gain antegrade access into the proximal graft using continuous ultrasound guidance. 21 gauge needle was exchanged for a transitional dilator set over 0.018 inch guidewire. Transitional dilator set was exchanged for 6 French sheath over 0.035 inch guidewire. Fistulagram showed mild narrowing multiple segments of the distal graft. There was severe hemodynamically significant stenosis at the venous anastomosis. No significant narrowing of the left basilic, axillary, subclavian, brachiocephalic veins or superior vena cava. Left brachiocephalic vein stent is widely patent. Areas of stenosis in the distal graft were plasty with 7 mm balloon. The focal stenosis at the venous anastomosis was plasty with a 7 and an 8 mm balloon. There was excellent luminal gain at  the venous anastomosis and mild luminal gain in the distal graft. Balloon occlusion reflux fistulagram showed no significant stenosis of the arterial anastomosis. Access was removed and hemostasis  was achieved with manual compression. IMPRESSION: 1. 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 Distal graft plastied with 7 mm balloon resulting in mild luminal gain. Severe stenosis of the venous anastomosis plasty with 8 mm balloon resulting in excellent luminal gain. 2. 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 Access of the graft near this region should be avoided. ACCESS: This access remains amenable to future percutaneous interventions as clinically indicated. Electronically Signed   By: Acquanetta Belling M.D.   On: 06/10/2023 16:08    Labs: BMET Recent Labs  Lab 06/09/23 1330 06/10/23 1531 06/11/23 0452  NA 140 135 136  K 5.0 3.0* 3.7  CL 110 102 101  CO2 10* 16* 20*  GLUCOSE 206* 168* 165*  BUN 172* 107* 112*  CREATININE 22.06* 14.62* 16.07*  CALCIUM 6.7* 6.8* 6.9*  PHOS  --  5.2*  --    CBC Recent Labs  Lab 06/09/23 1330 06/10/23 0855 06/10/23 1531 06/11/23 0452  WBC 9.7 8.1 5.5 7.4  NEUTROABS 7.3  --   --  4.4  HGB 10.7* 8.9* 9.1* 9.6*  HCT 32.6* 27.4* 26.8* 28.5*  MCV 89.3 89.0 84.3 85.6  PLT 79* 68* 61* 70*    Medications:     calcium carbonate  1,250 mg Oral BID WC   Chlorhexidine Gluconate Cloth  6 each Topical Q0600   dapsone  50 mg Oral Daily   heparin  5,000 Units Subcutaneous Q8H   insulin aspart  0-6 Units Subcutaneous TID WC   multivitamin  1 tablet Oral QHS   pantoprazole  40 mg Oral Daily   sodium chloride flush  3 mL Intravenous Q12H   tacrolimus  3 mg Oral BID      Anthony Sar, MD [facility] 06/11/2023, 9:47 AM

## 2023-06-11 NOTE — Significant Event (Signed)
Pt with N/V this AM per RN.  Unfortunately QTc = 530 per report from tele.  Will order a 1 time of ativan for nausea since can't really use any other nausea meds due to QT prolongation.

## 2023-06-11 NOTE — Plan of Care (Signed)

## 2023-06-11 NOTE — Discharge Summary (Signed)
Physician Discharge Summary   Patient: [name] MRN: [id number] DOB: [birth year]  Admit date:     06/09/2023  Discharge date: 06/11/23  Discharge Physician: Jonah Blue   PCP: Pcp, No   Recommendations at discharge:   Resume dialysis at your center Tuesday - do NOT miss dialysis sessions!  Discharge Diagnoses: Principal Problem:   Uremia Active Problems:   DM2 (diabetes mellitus, type 2) (HCC)   Anemia in chronic kidney disease   Essential hypertension, benign   End stage renal disease (HCC)   Hyperlipidemia with target low density lipoprotein (LDL) cholesterol less than 100 mg/dL   CHF (congestive heart failure) (HCC)   High anion gap metabolic acidosis   Noncompliance with renal dialysis [facility])  Resolved Problems:   Acute on chronic renal failure [facility])  Hospital Course: 52yo with h/o ESRD s/p failed renal transplant x 2 and no HD in > 1 month, HTN, HLD, and DM who presented on 10/24 due to missed HD.  He restarted HD at the beginning of 2024 but has been "too busy" to go.  Nephrology consulted and he will need re-initiation of HD.  Assessment and Plan:  ESRD with volume overload needing HD -Patient on chronic TTS HD -Nephrology prn order set utilized -He has missed HD for a month and is presenting with volume overload including uremia and high anion gap metabolic acidosis -The vast majority of his issues will likely be improved/resolved with HD  -Nephrology is consulting and has ordered HD -He will need serial HD,10/25 and 10/26 -There were access issues during 10/25 session and he was able to have ultrafiltration but not volume removal -Underwent IR fistulogram on 10/25 with mild-severe stenosis s/p balloon intervention x 2 -Repeat HD on 10/26 -He has had 2 episodes of vomiting on 10/26 pre-HD, likely associated with uremia -Anticipate discharge post-HD on 10/26 per discussion with nephrology -He is able to return to his HD center on TTS at 12pm chair time after  discharge -Continue Renavite   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 - it is difficult to keep the patient at an appropriate volume status if he does not go to HD -Ongoing counseling encouraged   S/p failed renal transplant -He has failed transplant x 2 -He remains on tacrolimus and dapsone   HTN -He is not taking home medications for this issue -HTN control is improved post-HD   DM -He does not appear to be taking medications for this issue at this time  -A1c is 6.9 -Glucose 206 on presentation -Continue with diet control for now, follow up outpatient   HLD -He does not appear to be taking medications for this issue at this time        Consultants: Nephrology IR   Procedures: HD 10/25, 10/26 Fistulogram 10/26     30 Day Unplanned Readmission Risk Score    Flowsheet Row ED to Hosp-Admission (Current) from 06/09/2023 in [place] [facility]  30 Day Unplanned Readmission Risk Score (%) 19.6 Filed at 06/11/2023 1200       This score is the patient's risk of an unplanned readmission within 30 days of being discharged (0 -100%). The score is based on dignosis, age, lab data, medications, orders, and past utilization.   Low:  0-14.9   Medium: 15-21.9   High: 22-29.9   Extreme: 30 and above          Pain control - [place] Controlled Substance Reporting System database was reviewed. and patient was instructed,  not to drive, operate heavy machinery, perform activities at heights, swimming or participation in water activities or provide baby-sitting services while on Pain, Sleep and Anxiety Medications; until their outpatient Physician has advised to do so again. Also recommended to not to take more than prescribed Pain, Sleep and Anxiety Medications.    Disposition: Home Diet recommendation:  Renal diet DISCHARGE MEDICATION: Allergies as of 06/11/2023       Reactions   Lactose Intolerance (gi) Diarrhea   No whole milk!!   Other Other (See  Comments)   Soy milk burns his tongue   Pollen Extract Other (See Comments)   Itching, watery eyes and sneezing.   Tape Rash   PLEASE USE COBAN WRAP Pt can't have paper tape        Medication List     TAKE these medications    Accu-Chek Aviva Plus test strip Generic drug: glucose blood 1 each by Other route 4 (four) times daily. DX:E11.9   Accu-Chek FastClix Lancets Misc 1 each by In Vitro route 4 (four) times daily. DX:E11.9   acetaminophen 500 MG tablet Commonly known as: TYLENOL Take 500 mg by mouth every 6 (six) hours as needed for mild pain (pain score 1-3).   aspirin EC 81 MG tablet Take 81 mg by mouth daily.   calcium carbonate 1500 (600 Ca) MG Tabs tablet Commonly known as: OSCAL Take 1,500 mg by mouth 2 (two) times daily with a meal.   dapsone 25 MG tablet Take 50 mg by mouth daily.   omeprazole 20 MG capsule Commonly known as: PRILOSEC Take 1 capsule (20 mg total) by mouth daily.   Rena-Vite Rx 1 MG Tabs Take 1 tablet by mouth daily.   tacrolimus 1 MG capsule Commonly known as: PROGRAF Take 3 capsules (3 mg total) by mouth 2 (two) times daily.   Tums Smoothies 750 MG chewable tablet Generic drug: calcium carbonate Chew 1 tablet by mouth as needed (heartburn, dialysis).        Follow-up Information     Center, [facility] Follow up.   Why: Schedule is Tuesday, Thursday, Saturday with 12:00 chair time. Please resume at d/c. Contact information: [address] [place] [address] [phone]                Discharge Exam:   Subjective: No complaints this AM - but had n/v x 2 before and after our visit.  Anticipate improvement with HD now and on an ongoing basis.   Objective: Vitals:   06/11/23 0802 06/11/23 1123  BP: (!) 150/76 138/83  Pulse: 100 97  Resp: 18   Temp: 98 F (36.7 C) 98.2 F (36.8 C)  SpO2: 97% 100%    Intake/Output Summary (Last 24 hours) at 06/11/2023 1351 Last data filed at 06/11/2023  1255 Gross per 24 hour  Intake 3 ml  Output 1 ml  Net 2 ml   Filed Weights   06/10/23 1244 06/11/23 0500 06/11/23 0617  Weight: 65.8 kg 66.5 kg 64.2 kg    Exam:  General:  Appears calm and comfortable and is in NAD Eyes:  EOMI, normal lids, iris ENT:  grossly normal hearing, lips & tongue, mmm Neck:  no LAD, masses or thyromegaly Cardiovascular:  RRR, no m/r/g. No LE edema.  Respiratory:   CTA bilaterally with no wheezes/rales/rhonchi.  Normal respiratory effort. Abdomen:  soft, NT, ND Skin:  no rash or induration seen on limited exam Musculoskeletal:  grossly normal tone BUE/BLE, good ROM, no bony abnormality  Psychiatric:  grossly normal mood and affect, speech fluent and appropriate, AOx3 Neurologic:  CN 2-12 grossly intact, moves all extremities in coordinated fashion   Data Reviewed: I have reviewed the patient's lab results since admission.  Pertinent labs for today include:   CO2 20 Glucose 165 BUN 112/Creatinine 16.07/GFR 3 Calcium 6.9 WBC 7.4 Hgb 9.6 - stable  Platelets 70 - stable     Condition at discharge: improving  The results of significant diagnostics from this hospitalization (including imaging, microbiology, ancillary and laboratory) are listed below for reference.   Imaging Studies: IR THROMBECTOMY AV FISTULA W/THROMBOLYSIS INC/SHUNT/IMG LEFT  Result Date: 06/10/2023 INDICATION: 52 year old gentleman with left forearm loop graft returns to IR for fistulogram and possible intervention given recent poor flow during dialysis. EXAM: Left forearm fistulagram with venoplasty MEDICATIONS: Versed 1 mg IV ANESTHESIA/SEDATION: None FLUOROSCOPY: Radiation Exposure Index (as provided by the fluoroscopic device): 41 mGy Kerma COMPLICATIONS: None immediate. PROCEDURE: Informed written consent was obtained from the patient after a thorough discussion of the procedural risks, benefits and alternatives. All questions were addressed. Maximal Sterile Barrier Technique was  utilized including caps, mask, sterile gowns, sterile gloves, sterile drape, hand hygiene and skin antiseptic. A timeout was performed prior to the initiation of the procedure. Sonographic evaluation of the proximal segment of the graft showed a dilated segment with partially thrombosed pseudoaneurysm which measured approximately 1.8 x 1.0 cm. Sonographic image documenting patency of the graft was obtained and placed in permanent medical record. Following local administration, 21 gauge needle was utilized to gain antegrade access into the proximal graft using continuous ultrasound guidance. 21 gauge needle was exchanged for a transitional dilator set over 0.018 inch guidewire. Transitional dilator set was exchanged for 6 French sheath over 0.035 inch guidewire. Fistulagram showed mild narrowing multiple segments of the distal graft. There was severe hemodynamically significant stenosis at the venous anastomosis. No significant narrowing of the left basilic, axillary, subclavian, brachiocephalic veins or superior vena cava. Left brachiocephalic vein stent is widely patent. Areas of stenosis in the distal graft were plasty with 7 mm balloon. The focal stenosis at the venous anastomosis was plasty with a 7 and an 8 mm balloon. There was excellent luminal gain at the venous anastomosis and mild luminal gain in the distal graft. Balloon occlusion reflux fistulagram showed no significant stenosis of the arterial anastomosis. Access was removed and hemostasis was achieved with manual compression. IMPRESSION: 1. 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 Distal graft plastied with 7 mm balloon resulting in mild luminal gain. Severe stenosis of the venous anastomosis plasty with 8 mm balloon resulting in excellent luminal gain. 2. 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 Access of the graft near this  region should be avoided. ACCESS: This access remains amenable to future percutaneous interventions as clinically indicated. Electronically Signed   By: Acquanetta Belling M.D.   On: 06/10/2023 16:08    Microbiology: Results for orders placed or performed during the hospital encounter of 01/29/19  SARS Coronavirus 2     Status: None   Collection Time: 01/29/19  3:30 PM  Result Value Ref Range Status   SARS Coronavirus 2 NOT DETECTED NOT DETECTED Final    Comment: (NOTE) SARS-CoV-2 target nucleic acids are NOT DETECTED. The SARS-CoV-2 RNA is generally detectable in upper and lower respiratory specimens during the acute phase of infection.  Negative  results do not preclude SARS-CoV-2 infection, do  not rule out co-infections with other pathogens, and should not be used as the sole basis for treatment or other patient management decisions.  Negative results must be combined with clinical observations, patient history, and epidemiological information. The expected result is Not Detected. Fact Sheet for Patients: http://www.biofiredefense.com/wp-content/uploads/2020/03/BIOFIRE-COVID -19-patients.pdf Fact Sheet for Healthcare Providers: http://www.biofiredefense.com/wp-content/uploads/2020/03/BIOFIRE-COVID -19-hcp.pdf This test is not yet approved or cleared by the [place] and  has been authorized for detection and/or diagnosis of SARS-CoV-2 by FDA under an Emergency Use Authorization (EUA).  This EUA will remain in effec t (meaning this test can be used) for the duration of  the COVID-19 declaration under Section 564(b)(1) of the Act, 21 U.S.C. section 360bbb-3(b)(1), unless the authorization is terminated or revoked sooner. Performed at [facility], [address], [place], [place] [address]   Blood culture (routine x 2)     Status: None   Collection Time: 01/29/19  4:26 PM   Specimen: BLOOD  Result Value Ref Range Status   Specimen Description BLOOD RIGHT ANTECUBITAL   Final   Special Requests   Final    BOTTLES DRAWN AEROBIC AND ANAEROBIC Blood Culture adequate volume   Culture   Final    NO GROWTH 5 DAYS Performed at [facility], [address] [place], [place] [address]    Report Status 02/03/2019 FINAL  Final  Blood culture (routine x 2)     Status: None   Collection Time: 01/29/19  6:55 PM   Specimen: BLOOD  Result Value Ref Range Status   Specimen Description BLOOD RIGHT ANTECUBITAL  Final   Special Requests   Final    BOTTLES DRAWN AEROBIC ONLY Blood Culture adequate volume   Culture   Final    NO GROWTH 5 DAYS Performed at [facility], [address] [place], [place] [address]    Report Status 02/03/2019 FINAL  Final  C difficile quick scan w PCR reflex     Status: None   Collection Time: 01/29/19  8:00 PM   Specimen: STOOL  Result Value Ref Range Status   C Diff antigen NEGATIVE NEGATIVE Final   C Diff toxin NEGATIVE NEGATIVE Final   C Diff interpretation No C. difficile detected.  Final    Comment: Performed at [facility], [address], [place], [place] [address]  Gastrointestinal Panel by PCR , Stool     Status: None   Collection Time: 01/29/19  8:00 PM   Specimen: Stool  Result Value Ref Range Status   Campylobacter species NOT DETECTED NOT DETECTED Final   Plesimonas shigelloides NOT DETECTED NOT DETECTED Final   Salmonella species NOT DETECTED NOT DETECTED Final   Yersinia enterocolitica NOT DETECTED NOT DETECTED Final   Vibrio species NOT DETECTED NOT DETECTED Final   Vibrio cholerae NOT DETECTED NOT DETECTED Final   Enteroaggregative E coli (EAEC) NOT DETECTED NOT DETECTED Final   Enteropathogenic E coli (EPEC) NOT DETECTED NOT DETECTED Final   Enterotoxigenic E coli (ETEC) NOT DETECTED NOT DETECTED Final   Shiga like toxin producing E coli (STEC) NOT DETECTED NOT DETECTED Final   Shigella/Enteroinvasive E coli (EIEC) NOT DETECTED NOT DETECTED Final   Cryptosporidium NOT DETECTED NOT DETECTED  Final   Cyclospora cayetanensis NOT DETECTED NOT DETECTED Final   Entamoeba histolytica NOT DETECTED NOT DETECTED Final   Giardia lamblia NOT DETECTED NOT DETECTED Final   Adenovirus F40/41 NOT DETECTED NOT DETECTED Final   Astrovirus NOT DETECTED NOT DETECTED Final  Norovirus GI/GII NOT DETECTED NOT DETECTED Final   Rotavirus A NOT DETECTED NOT DETECTED Final   Sapovirus (I, II, IV, and V) NOT DETECTED NOT DETECTED Final    Comment: Performed at [facility], [address] [place], [place] [address]  MRSA PCR Screening     Status: None   Collection Time: 01/29/19 10:50 PM   Specimen: Nasal Mucosa; Nasopharyngeal  Result Value Ref Range Status   MRSA by PCR NEGATIVE NEGATIVE Final    Comment:        The GeneXpert MRSA Assay (FDA approved for NASAL specimens only), is one component of a comprehensive MRSA colonization surveillance program. It is not intended to diagnose MRSA infection nor to guide or monitor treatment for MRSA infections. Performed at [facility], [address], [place], [place] [address]     Labs: CBC: Recent Labs  Lab 06/09/23 1330 06/10/23 0855 06/10/23 1531 06/11/23 0452  WBC 9.7 8.1 5.5 7.4  NEUTROABS 7.3  --   --  4.4  HGB 10.7* 8.9* 9.1* 9.6*  HCT 32.6* 27.4* 26.8* 28.5*  MCV 89.3 89.0 84.3 85.6  PLT 79* 68* 61* 70*   Basic Metabolic Panel: Recent Labs  Lab 06/09/23 1330 06/10/23 1531 06/11/23 0452  NA 140 135 136  K 5.0 3.0* 3.7  CL 110 102 101  CO2 10* 16* 20*  GLUCOSE 206* 168* 165*  BUN 172* 107* 112*  CREATININE 22.06* 14.62* 16.07*  CALCIUM 6.7* 6.8* 6.9*  PHOS  --  5.2*  --    Liver Function Tests: Recent Labs  Lab 06/09/23 1330 06/10/23 1531  AST 16  --   ALT 12  --   ALKPHOS 87  --   BILITOT 0.7  --   PROT 8.1  --   ALBUMIN 4.3 3.6   CBG: Recent Labs  Lab 06/10/23 1659 06/10/23 2107 06/11/23 0655 06/11/23 0934 06/11/23 1304  GLUCAP 147* 165* 203* 205* 222*    Discharge time spent:  greater than 30 minutes.  Signed: Jonah Blue, MD [facility] 06/11/2023

## 2023-06-11 NOTE — Progress Notes (Signed)
Patient still has not been to HD.  Family is uncomfortable with plan to dc post-HD.  Will hold discharge until tomorrow.  Georgana Curio, M.D.

## 2023-06-11 NOTE — Progress Notes (Signed)
Patient was taken for dialysis at 1840PM via bed.

## 2023-06-12 DIAGNOSIS — N19 Unspecified kidney failure: Secondary | ICD-10-CM | POA: Diagnosis not present

## 2023-06-12 LAB — GLUCOSE, CAPILLARY
Glucose-Capillary: 212 mg/dL — ABNORMAL HIGH (ref 70–99)
Glucose-Capillary: 190 mg/dL — ABNORMAL HIGH (ref 70–99)

## 2023-06-12 LAB — BASIC METABOLIC PANEL
Anion gap: 24 — ABNORMAL HIGH (ref 5–15)
BUN: 54 mg/dL — ABNORMAL HIGH (ref 6–20)
CO2: 19 mmol/L — ABNORMAL LOW (ref 22–32)
Calcium: 7.6 mg/dL — ABNORMAL LOW (ref 8.9–10.3)
Chloride: 92 mmol/L — ABNORMAL LOW (ref 98–111)
Creatinine, Ser: 10.77 mg/dL — ABNORMAL HIGH (ref 0.61–1.24)
GFR, Estimated: 5 mL/min — ABNORMAL LOW (ref >60)
Glucose, Bld: 167 mg/dL — ABNORMAL HIGH (ref 70–99)
Potassium: 3.6 mmol/L (ref 3.5–5.1)
Sodium: 135 mmol/L (ref 135–145)

## 2023-06-12 LAB — CBC
HCT: 31.6 % — ABNORMAL LOW (ref 39.0–52.0)
Hemoglobin: 10.4 g/dL — ABNORMAL LOW (ref 13.0–17.0)
MCH: 28.4 pg (ref 26.0–34.0)
MCHC: 32.9 g/dL (ref 30.0–36.0)
MCV: 86.3 fL (ref 80.0–100.0)
Platelets: 81 10*3/uL — ABNORMAL LOW (ref 150–400)
RBC: 3.66 MIL/uL — ABNORMAL LOW (ref 4.22–5.81)
RDW: 14.6 % (ref 11.5–15.5)
WBC: 17.6 10*3/uL — ABNORMAL HIGH (ref 4.0–10.5)
nRBC: 0 % (ref 0.0–0.2)

## 2023-06-12 LAB — MAGNESIUM: Magnesium: 1.5 mg/dL — ABNORMAL LOW (ref 1.7–2.4)

## 2023-06-12 MED ORDER — MAGNESIUM OXIDE -MG SUPPLEMENT 400 (240 MG) MG PO TABS
800.0000 mg | ORAL_TABLET | Freq: Once | ORAL | Status: DC
  Filled 2023-06-12: qty 2

## 2023-06-12 MED ORDER — METOCLOPRAMIDE HCL 5 MG PO TABS: 5.0000 mg | ORAL_TABLET | Freq: Four times a day (QID) | ORAL | 0 refills | Status: DC | PRN

## 2023-06-12 MED ORDER — METOCLOPRAMIDE HCL 5 MG PO TABS: 5.0000 mg | ORAL_TABLET | Freq: Four times a day (QID) | ORAL | Status: DC | PRN

## 2023-06-12 MED ORDER — METOCLOPRAMIDE HCL 5 MG PO TABS: 5.0000 mg | ORAL_TABLET | Freq: Four times a day (QID) | ORAL | 0 refills | Status: AC | PRN

## 2023-06-12 NOTE — Plan of Care (Signed)

## 2023-06-12 NOTE — Plan of Care (Signed)

## 2023-06-12 NOTE — Discharge Summary (Addendum)
Physician Discharge Summary   Patient: [name] MRN: [id number] DOB: 30-Nov-1970  Admit date:     06/09/2023  Discharge date: 06/12/23  Discharge Physician: Jonah Blue   PCP: Pcp, No   Recommendations at discharge:   Resume dialysis at your center Tuesday - do NOT miss dialysis sessions!  Take Reglan if needed for nausea/vomiting   Discharge Diagnoses: Principal Problem:   Uremia Active Problems:   DM2 (diabetes mellitus, type 2) (HCC)   Anemia in chronic kidney disease   Essential hypertension, benign   End stage renal disease (HCC)   Hyperlipidemia with target low density lipoprotein (LDL) cholesterol less than 100 mg/dL   CHF (congestive heart failure) (HCC)   High anion gap metabolic acidosis   Noncompliance with renal dialysis [facility])  Resolved Problems:   Acute on chronic renal failure [facility])  Hospital Course: 52yo with h/o ESRD s/p failed renal transplant x 2 and no HD in > 1 month, HTN, HLD, and DM who presented on 10/24 due to missed HD.  He restarted HD at the beginning of 2024 but has been "too busy" to go.  Nephrology consulted and he will need re-initiation of HD.  Assessment and Plan:  ESRD with volume overload needing HD -Patient on chronic TTS HD -Nephrology prn order set utilized -He has missed HD for a month and is presenting with volume overload including uremia and high anion gap metabolic acidosis -The vast majority of his issues will likely be improved/resolved with HD  -Nephrology is consulting and has ordered HD -He will need serial HD,10/25 and 10/26 -There were access issues during 10/25 session and he was able to have ultrafiltration but not volume removal -Underwent IR fistulogram on 10/25 with mild-severe stenosis s/p balloon intervention x 2 -Repeat HD on 10/26 -He has had 2 episodes of vomiting on 10/26 pre-HD, likely associated with uremia -Anticipate discharge post-HD - planned for 10/26 per discussion with nephrology but he did not  complete HD until about 11pm so discharge was held until today -He is able to return to his HD center on TTS at 12pm chair time after discharge -Continue Renavite -Reglan provided for prn use for n/v in the interim   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 - it is difficult to keep the patient at an appropriate volume status if he does not go to HD -Ongoing counseling encouraged   S/p failed renal transplant -He has failed transplant x 2 -He remains on tacrolimus and dapsone   HTN -He is not taking home medications for this issue -HTN control is improved post-HD   DM -He does not appear to be taking medications for this issue at this time  -A1c is 6.9 -Glucose 206 on presentation -Continue with diet control for now, follow up outpatient   HLD -He does not appear to be taking medications for this issue at this time        Consultants: Nephrology IR   Procedures: HD 10/25, 10/26 Fistulogram 10/26   30 Day Unplanned Readmission Risk Score    Flowsheet Row ED to Hosp-Admission (Current) from 06/09/2023 in [facility]  30 Day Unplanned Readmission Risk Score (%) 19.79 Filed at 06/12/2023 0401       This score is the patient's risk of an unplanned readmission within 30 days of being discharged (0 -100%). The score is based on dignosis, age, lab data, medications, orders, and past utilization.   Low:  0-14.9   Medium: 15-21.9  High: 22-29.9   Extreme: 30 and above          Pain control - [employer] Controlled Substance Reporting System database was reviewed. and patient was instructed, not to drive, operate heavy machinery, perform activities at heights, swimming or participation in water activities or provide baby-sitting services while on Pain, Sleep and Anxiety Medications; until their outpatient Physician has advised to do so again. Also recommended to not to take more than prescribed Pain, Sleep and Anxiety Medications.   Disposition:  Home Diet recommendation:  Discharge Diet Orders (From admission, onward)     Start     Ordered   06/11/23 0000  Diet - low sodium heart healthy        06/11/23 1351           Renal diet DISCHARGE MEDICATION: Allergies as of 06/12/2023       Reactions   Lactose Intolerance (gi) Diarrhea   No whole milk!!   Other Other (See Comments)   Soy milk burns his tongue   Pollen Extract Other (See Comments)   Itching, watery eyes and sneezing.   Tape Rash   PLEASE USE COBAN WRAP Pt can't have paper tape        Medication List     TAKE these medications    Accu-Chek Aviva Plus test strip Generic drug: glucose blood 1 each by Other route 4 (four) times daily. DX:E11.9   Accu-Chek FastClix Lancets Misc 1 each by In Vitro route 4 (four) times daily. DX:E11.9   acetaminophen 500 MG tablet Commonly known as: TYLENOL Take 500 mg by mouth every 6 (six) hours as needed for mild pain (pain score 1-3).   aspirin EC 81 MG tablet Take 81 mg by mouth daily.   calcium carbonate 1500 (600 Ca) MG Tabs tablet Commonly known as: OSCAL Take 1,500 mg by mouth 2 (two) times daily with a meal.   dapsone 25 MG tablet Take 50 mg by mouth daily.   metoCLOPramide 5 MG tablet Commonly known as: REGLAN Take 1 tablet (5 mg total) by mouth every 6 (six) hours as needed for nausea or vomiting.   omeprazole 20 MG capsule Commonly known as: PRILOSEC Take 1 capsule (20 mg total) by mouth daily.   Rena-Vite Rx 1 MG Tabs Take 1 tablet by mouth daily.   tacrolimus 1 MG capsule Commonly known as: PROGRAF Take 3 capsules (3 mg total) by mouth 2 (two) times daily.   Tums Smoothies 750 MG chewable tablet Generic drug: calcium carbonate Chew 1 tablet by mouth as needed (heartburn, dialysis).        Follow-up Information     Center, [facility] Follow up.   Why: Schedule is Tuesday, Thursday, Saturday with 12:00 chair time. Please resume at d/c. Contact information: [address] [place] [address] [phone]                Discharge Exam:   Subjective: Feeling much better, ready to go home.  Wife at the bedside, wants him to have a limited number of nausea pills in case he needs them prior to HD on Tuesday.   Objective: Vitals:   06/12/23 0824 06/12/23 1140  BP: 118/66 133/78  Pulse: (!) 121 (!) 115  Resp: 16   Temp: 98 F (36.7 C) 98.8 F (37.1 C)  SpO2: 97% 95%    Intake/Output Summary (Last 24 hours) at 06/12/2023 1339 Last data filed at 06/11/2023 2236 Gross per 24 hour  Intake --  Output 700 ml  Net -700 ml   Filed Weights   06/11/23 1854 06/11/23 2247 06/12/23 0444  Weight: 62.1 kg 62.1 kg 62.6 kg    Exam:  General:  Appears calm and comfortable and is in NAD Eyes:  EOMI, normal lids, iris ENT:  grossly normal hearing, lips & tongue, mmm Neck:  no LAD, masses or thyromegaly Cardiovascular:  RRR, no m/r/g. No LE edema.  Respiratory:   CTA bilaterally with no wheezes/rales/rhonchi.  Normal respiratory effort. Abdomen:  soft, NT, ND Skin:  no rash or induration seen on limited exam Musculoskeletal:  grossly normal tone BUE/BLE, good ROM, no bony abnormality Psychiatric:  grossly normal mood and affect, speech fluent and appropriate, AOx3 Neurologic:  CN 2-12 grossly intact, moves all extremities in coordinated fashion  Data Reviewed: I have reviewed the patient's lab results since admission.  Pertinent labs for today include:  CO2 19 Glucose 167 BUN 54/Creatinine 10.77/GFR 5 Mag++ 1.5 WBC 17.6 Hgb 10.4    Condition at discharge: good  The results of significant diagnostics from this hospitalization (including imaging, microbiology, ancillary and laboratory) are listed below for reference.   Imaging Studies: IR THROMBECTOMY AV FISTULA W/THROMBOLYSIS INC/SHUNT/IMG LEFT  Result Date: 06/10/2023 INDICATION: 52 year old gentleman with left forearm loop graft returns to IR for fistulogram and possible intervention  given recent poor flow during dialysis. EXAM: Left forearm fistulagram with venoplasty MEDICATIONS: Versed 1 mg IV ANESTHESIA/SEDATION: None FLUOROSCOPY: Radiation Exposure Index (as provided by the fluoroscopic device): 41 mGy Kerma COMPLICATIONS: None immediate. PROCEDURE: Informed written consent was obtained from the patient after a thorough discussion of the procedural risks, benefits and alternatives. All questions were addressed. Maximal Sterile Barrier Technique was utilized including caps, mask, sterile gowns, sterile gloves, sterile drape, hand hygiene and skin antiseptic. A timeout was performed prior to the initiation of the procedure. Sonographic evaluation of the proximal segment of the graft showed a dilated segment with partially thrombosed pseudoaneurysm which measured approximately 1.8 x 1.0 cm. Sonographic image documenting patency of the graft was obtained and placed in permanent medical record. Following local administration, 21 gauge needle was utilized to gain antegrade access into the proximal graft using continuous ultrasound guidance. 21 gauge needle was exchanged for a transitional dilator set over 0.018 inch guidewire. Transitional dilator set was exchanged for 6 French sheath over 0.035 inch guidewire. Fistulagram showed mild narrowing multiple segments of the distal graft. There was severe hemodynamically significant stenosis at the venous anastomosis. No significant narrowing of the left basilic, axillary, subclavian, brachiocephalic veins or superior vena cava. Left brachiocephalic vein stent is widely patent. Areas of stenosis in the distal graft were plasty with 7 mm balloon. The focal stenosis at the venous anastomosis was plasty with a 7 and an 8 mm balloon. There was excellent luminal gain at the venous anastomosis and mild luminal gain in the distal graft. Balloon occlusion reflux fistulagram showed no significant stenosis of the arterial anastomosis. Access was removed and  hemostasis was achieved with manual compression. IMPRESSION: 1. 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 Distal graft plastied with 7 mm balloon resulting in mild luminal gain. Severe stenosis of the venous anastomosis plasty with 8 mm balloon resulting in excellent luminal gain. 2. 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 Access of the graft near this region should be avoided. ACCESS: This access remains amenable to future percutaneous interventions as clinically indicated. Electronically Signed  By: Mauri Reading  Mir M.D.   On: 06/10/2023 16:08    Microbiology: Results for orders placed or performed during the hospital encounter of 01/29/19  SARS Coronavirus 2     Status: None   Collection Time: 01/29/19  3:30 PM  Result Value Ref Range Status   SARS Coronavirus 2 NOT DETECTED NOT DETECTED Final    Comment: (NOTE) SARS-CoV-2 target nucleic acids are NOT DETECTED. The SARS-CoV-2 RNA is generally detectable in upper and lower respiratory specimens during the acute phase of infection.  Negative  results do not preclude SARS-CoV-2 infection, do not rule out co-infections with other pathogens, and should not be used as the sole basis for treatment or other patient management decisions.  Negative results must be combined with clinical observations, patient history, and epidemiological information. The expected result is Not Detected. Fact Sheet for Patients: http://www.biofiredefense.com/wp-content/uploads/2020/03/BIOFIRE-COVID -19-patients.pdf Fact Sheet for Healthcare Providers: http://www.biofiredefense.com/wp-content/uploads/2020/03/BIOFIRE-COVID -19-hcp.pdf This test is not yet approved or cleared by the [place] and  has been authorized for detection and/or diagnosis of SARS-CoV-2 by FDA under an Emergency Use Authorization (EUA).  This EUA will remain in  effec t (meaning this test can be used) for the duration of  the COVID-19 declaration under Section 564(b)(1) of the Act, 21 U.S.C. section 360bbb-3(b)(1), unless the authorization is terminated or revoked sooner. Performed at [facility], [address], [place], [place] [address]   Blood culture (routine x 2)     Status: None   Collection Time: 01/29/19  4:26 PM   Specimen: BLOOD  Result Value Ref Range Status   Specimen Description BLOOD RIGHT ANTECUBITAL  Final   Special Requests   Final    BOTTLES DRAWN AEROBIC AND ANAEROBIC Blood Culture adequate volume   Culture   Final    NO GROWTH 5 DAYS Performed at [facility], [address] [place], [place] [address]    Report Status 02/03/2019 FINAL  Final  Blood culture (routine x 2)     Status: None   Collection Time: 01/29/19  6:55 PM   Specimen: BLOOD  Result Value Ref Range Status   Specimen Description BLOOD RIGHT ANTECUBITAL  Final   Special Requests   Final    BOTTLES DRAWN AEROBIC ONLY Blood Culture adequate volume   Culture   Final    NO GROWTH 5 DAYS Performed at [facility], [address] [place], [place] [address]    Report Status 02/03/2019 FINAL  Final  C difficile quick scan w PCR reflex     Status: None   Collection Time: 01/29/19  8:00 PM   Specimen: STOOL  Result Value Ref Range Status   C Diff antigen NEGATIVE NEGATIVE Final   C Diff toxin NEGATIVE NEGATIVE Final   C Diff interpretation No C. difficile detected.  Final    Comment: Performed at [facility], [address], [place], [place] [address]  Gastrointestinal Panel by PCR , Stool     Status: None   Collection Time: 01/29/19  8:00 PM   Specimen: Stool  Result Value Ref Range Status   Campylobacter species NOT DETECTED NOT DETECTED Final   Plesimonas shigelloides NOT DETECTED NOT DETECTED Final   Salmonella species NOT DETECTED NOT DETECTED Final   Yersinia enterocolitica NOT DETECTED NOT DETECTED Final   Vibrio  species NOT DETECTED NOT DETECTED Final   Vibrio cholerae NOT DETECTED NOT DETECTED Final   Enteroaggregative E coli (EAEC) NOT DETECTED NOT DETECTED  Final   Enteropathogenic E coli (EPEC) NOT DETECTED NOT DETECTED Final   Enterotoxigenic E coli (ETEC) NOT DETECTED NOT DETECTED Final   Shiga like toxin producing E coli (STEC) NOT DETECTED NOT DETECTED Final   Shigella/Enteroinvasive E coli (EIEC) NOT DETECTED NOT DETECTED Final   Cryptosporidium NOT DETECTED NOT DETECTED Final   Cyclospora cayetanensis NOT DETECTED NOT DETECTED Final   Entamoeba histolytica NOT DETECTED NOT DETECTED Final   Giardia lamblia NOT DETECTED NOT DETECTED Final   Adenovirus F40/41 NOT DETECTED NOT DETECTED Final   Astrovirus NOT DETECTED NOT DETECTED Final   Norovirus GI/GII NOT DETECTED NOT DETECTED Final   Rotavirus A NOT DETECTED NOT DETECTED Final   Sapovirus (I, II, IV, and V) NOT DETECTED NOT DETECTED Final    Comment: Performed at [facility], [address] [place], [place] [address]  MRSA PCR Screening     Status: None   Collection Time: 01/29/19 10:50 PM   Specimen: Nasal Mucosa; Nasopharyngeal  Result Value Ref Range Status   MRSA by PCR NEGATIVE NEGATIVE Final    Comment:        The GeneXpert MRSA Assay (FDA approved for NASAL specimens only), is one component of a comprehensive MRSA colonization surveillance program. It is not intended to diagnose MRSA infection nor to guide or monitor treatment for MRSA infections. Performed at [facility], [address] [place], [place] [address]     Labs: CBC: Recent Labs  Lab 06/09/23 1330 06/10/23 0855 06/10/23 1531 06/11/23 0452 06/12/23 0538  WBC 9.7 8.1 5.5 7.4 17.6*  NEUTROABS 7.3  --   --  4.4  --   HGB 10.7* 8.9* 9.1* 9.6* 10.4*  HCT 32.6* 27.4* 26.8* 28.5* 31.6*  MCV 89.3 89.0 84.3 85.6 86.3  PLT 79* 68* 61* 70* 81*   Basic Metabolic Panel: Recent Labs  Lab 06/09/23 1330 06/10/23 1531 06/11/23 0452  06/12/23 0538  NA 140 135 136 135  K 5.0 3.0* 3.7 3.6  CL 110 102 101 92*  CO2 10* 16* 20* 19*  GLUCOSE 206* 168* 165* 167*  BUN 172* 107* 112* 54*  CREATININE 22.06* 14.62* 16.07* 10.77*  CALCIUM 6.7* 6.8* 6.9* 7.6*  MG  --   --   --  1.5*  PHOS  --  5.2*  --   --    Liver Function Tests: Recent Labs  Lab 06/09/23 1330 06/10/23 1531  AST 16  --   ALT 12  --   ALKPHOS 87  --   BILITOT 0.7  --   PROT 8.1  --   ALBUMIN 4.3 3.6   CBG: Recent Labs  Lab 06/11/23 0934 06/11/23 1304 06/11/23 1606 06/12/23 0823 06/12/23 1147  GLUCAP 205* 222* 141* 212* 190*    Discharge time spent: less than 30 minutes.  Signed: Jonah Blue, MD [facility] 06/12/2023

## 2023-06-12 NOTE — Discharge Planning (Signed)
[facility] Patient Discharge Orders - [facility] CLINIC: [facility]  Patient's name: Edison Colvard Admit/DC Dates: 06/09/2023 - 06/12/23  DISCHARGE DIAGNOSES: Uremia - in setting of missed HD > 1 mo, BUN 172/Cr 22/CO2 10 on admit  ESRD - resumed HD (#1 on 10/25, then #2 on 10/26) Hypocalcemia, due to #1. Tums added QHS Nausea/vomiting - on Reglan  HD ORDERS: 4 hours 180 dialyzer BFR 400 DFR A1.5 3K/2.5Ca bath AVG heparin 4000 units q HD  ANEMIA MANAGEMENT: Aranesp: Given: yes   Amount/Date of last dose: on 10/26 ESA dose for discharge: mircera 50 mcg IV q 2 weeks, to start on 06/21/23 IV Iron dose at discharge: none to start, then per protocol (tsat 77% on 10/24) Transfusion: Given: no  BONE/MINERAL MEDICATIONS: Hectorol/Calcitriol change: Check PTH, start per protocol  ACCESS INTERVENTION/CHANGE: L forearm loop AVG s/p shuntogram 10/25 --NEED TO AVOID STICKING PARTIALLY THROMBOSED PSEUDOANEURYSM ON PROXIMAL AVG  RECENT LABS: Recent Labs  Lab 06/10/23 1531 06/11/23 0452 06/12/23 0538  HGB 9.1*   < > 10.4*  NA 135   < > 135  K 3.0*   < > 3.6  CALCIUM 6.8*   < > 7.6*  PHOS 5.2*  --   --   ALBUMIN 3.6  --   --    < > = values in this interval not displayed.   IV ANTIBIOTICS: no Details:  OTHER ANTICOAGULATION: On Coumadin?: no  OTHER/APPTS/LAB ORDERS:  - Check full monthly labs on admit, then K/Hgb weekly  - F/u Prograf level -- ordered 10/27, pending at time of discharge. Decide if can wean off this. Still on Dapsone as well - decide if can come off this.  D/C Meds to be reconciled by nurse after every discharge.  Completed By: Ozzie Hoyle, PA-C [facility] Pager [phone]   Reviewed by: MD:______ RN_______

## 2023-06-12 NOTE — Progress Notes (Addendum)
[facility] Progress Note    Assessment/ Plan:   1 Uremia: hasn't been to dialysis in > 1 month.  re-initiated HD--slow start protocol as below. Hasn't been discharged from his outpatient HD unit therefore still has a chair 2 ESRD: outpatient HD [facility]---no dialysis in 1 month.  re-initiated like a new start, HD#1 10/25, #2 10/26. Next HD planned for Tuesday 10/29 which can be done as an outpatient 3 Hypertension:  UF as tolerated 4. Anemia of ESRD:  Hgb up- 10.4, iron replete on panel. dosed ESA for sat 5. Metabolic Bone Disease: Ca a little low, started on TUMS at night and Vit D during dialysis. Po4 5.2-acceptable 6. Access: issues with cannulation and arterial pressures. S/p fgram with IR 10/25: Mild stenosis of distal graft and severe stenosis at the venous anastomosis, ballooned; partially thrombosed pseudoaneurysm of the proximal graft- access of the graft near this region should be avoided 7. Tremors: still on prograf, will check prograf levels before he receives his AM dose, can follow results as an outpatient  Dispo: admitted  Okay for discharge from a nephrology perspective. See subjective. Reinforced compliance with treatments, discussed risks. Will discuss/evaluate further as an outpatient in regards to home therapies candidacy.  Anthony Sar, MD [facility]   Subjective:   Patient seen and examined bedside. Wife at bedside. D/c held due to finishing HD late yesterday. Feels much better in regards to N/V. Eating breakfast without issues, no food aversion now. He reports that he is ready to go home today  We had a lengthy discussion this AM about compliance with HD and risks. I did explain to him that his native kidney function is likely not going to help him sustain OFF dialysis as evident by his presentation to the ER. I did implore him and his wife to reconsider home therapies if that will help him be more compliant/adherent to HD. They will think  about this more and we will revisit this again at his outpatient HD unit.   Objective:   BP 118/66 (BP Location: Right Arm)   Pulse (!) 121   Temp 98 F (36.7 C)   Resp 16   Ht 5\' 5"  (1.651 m)   Wt 62.6 kg   SpO2 97%   BMI 22.96 kg/m   Intake/Output Summary (Last 24 hours) at 06/12/2023 0841 Last data filed at 06/11/2023 2236 Gross per 24 hour  Intake --  Output 701 ml  Net -701 ml   Weight change: -3.7 kg  Physical Exam: Gen: NAD CVS: RRR Resp: cta bl Abd: soft, nt/nd Ext: no sig edema b/l Les Neuro: awake, alert, +tremors Dialysis access: LUE AVG +b/t  Imaging: IR THROMBECTOMY AV FISTULA W/THROMBOLYSIS INC/SHUNT/IMG LEFT  Result Date: 06/10/2023 INDICATION: 52 year old gentleman with left forearm loop graft returns to IR for fistulogram and possible intervention given recent poor flow during dialysis. EXAM: Left forearm fistulagram with venoplasty MEDICATIONS: Versed 1 mg IV ANESTHESIA/SEDATION: None FLUOROSCOPY: Radiation Exposure Index (as provided by the fluoroscopic device): 41 mGy Kerma COMPLICATIONS: None immediate. PROCEDURE: Informed written consent was obtained from the patient after a thorough discussion of the procedural risks, benefits and alternatives. All questions were addressed. Maximal Sterile Barrier Technique was utilized including caps, mask, sterile gowns, sterile gloves, sterile drape, hand hygiene and skin antiseptic. A timeout was performed prior to the initiation of the procedure. Sonographic evaluation of the proximal segment of the graft showed a dilated segment with partially thrombosed pseudoaneurysm which measured approximately 1.8 x 1.0  cm. Sonographic image documenting patency of the graft was obtained and placed in permanent medical record. Following local administration, 21 gauge needle was utilized to gain antegrade access into the proximal graft using continuous ultrasound guidance. 21 gauge needle was exchanged for a transitional dilator set  over 0.018 inch guidewire. Transitional dilator set was exchanged for 6 French sheath over 0.035 inch guidewire. Fistulagram showed mild narrowing multiple segments of the distal graft. There was severe hemodynamically significant stenosis at the venous anastomosis. No significant narrowing of the left basilic, axillary, subclavian, brachiocephalic veins or superior vena cava. Left brachiocephalic vein stent is widely patent. Areas of stenosis in the distal graft were plasty with 7 mm balloon. The focal stenosis at the venous anastomosis was plasty with a 7 and an 8 mm balloon. There was excellent luminal gain at the venous anastomosis and mild luminal gain in the distal graft. Balloon occlusion reflux fistulagram showed no significant stenosis of the arterial anastomosis. Access was removed and hemostasis was achieved with manual compression. IMPRESSION: 1. 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 Distal graft plastied with 7 mm balloon resulting in mild luminal gain. Severe stenosis of the venous anastomosis plasty with 8 mm balloon resulting in excellent luminal gain. 2. 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 Access of the graft near this region should be avoided. ACCESS: This access remains amenable to future percutaneous interventions as clinically indicated. Electronically Signed   By: Acquanetta Belling M.D.   On: 06/10/2023 16:08    Labs: BMET Recent Labs  Lab 06/09/23 1330 06/10/23 1531 06/11/23 0452 06/12/23 0538  NA 140 135 136 135  K 5.0 3.0* 3.7 3.6  CL 110 102 101 92*  CO2 10* 16* 20* 19*  GLUCOSE 206* 168* 165* 167*  BUN 172* 107* 112* 54*  CREATININE 22.06* 14.62* 16.07* 10.77*  CALCIUM 6.7* 6.8* 6.9* 7.6*  PHOS  --  5.2*  --   --    CBC Recent Labs  Lab 06/09/23 1330 06/10/23 0855 06/10/23 1531 06/11/23 0452 06/12/23 0538  WBC 9.7 8.1 5.5 7.4 17.6*   NEUTROABS 7.3  --   --  4.4  --   HGB 10.7* 8.9* 9.1* 9.6* 10.4*  HCT 32.6* 27.4* 26.8* 28.5* 31.6*  MCV 89.3 89.0 84.3 85.6 86.3  PLT 79* 68* 61* 70* 81*    Medications:     calcium carbonate  1,250 mg Oral BID WC   Chlorhexidine Gluconate Cloth  6 each Topical Q0600   dapsone  50 mg Oral Daily   darbepoetin (ARANESP) injection - DIALYSIS  60 mcg Subcutaneous Q Sat-1800   heparin  5,000 Units Subcutaneous Q8H   insulin aspart  0-6 Units Subcutaneous TID WC   multivitamin  1 tablet Oral QHS   pantoprazole  40 mg Oral Daily   sodium chloride flush  3 mL Intravenous Q12H   tacrolimus  3 mg Oral BID      Anthony Sar, MD [facility] 06/12/2023, 8:41 AM

## 2023-06-12 NOTE — Plan of Care (Signed)
Problem: Education: Goal: Knowledge of General Education information will improve Description: Including pain rating scale, medication(s)/side effects and non-pharmacologic comfort measures 06/12/2023 0812 by Waldemar Dickens, Ellyn Hack, RN Outcome: Adequate for Discharge 06/12/2023 [phone] by Clearnce Sorrel, RN Outcome: Adequate for Discharge 06/12/2023 [phone] by Clearnce Sorrel, RN Outcome: Adequate for Discharge   Problem: Health Behavior/Discharge Planning: Goal: Ability to manage health-related needs will improve 06/12/2023 0812 by Clearnce Sorrel, RN Outcome: Adequate for Discharge 06/12/[phone] by Clearnce Sorrel, RN Outcome: Adequate for Discharge 06/12/2023 [phone] by Clearnce Sorrel, RN Outcome: Adequate for Discharge   Problem: Clinical Measurements: Goal: Ability to maintain clinical measurements within normal limits will improve 06/12/2023 0812 by Clearnce Sorrel, RN Outcome: Adequate for Discharge 06/12/2023 [phone] by Clearnce Sorrel, RN Outcome: Adequate for Discharge 06/12/2023 [phone] by Clearnce Sorrel, RN Outcome: Adequate for Discharge Goal: Will remain free from infection 06/12/2023 0812 by Clearnce Sorrel, RN Outcome: Adequate for Discharge 06/12/2023 [phone] by Clearnce Sorrel, RN Outcome: Adequate for Discharge 06/12/2023 [phone] by Clearnce Sorrel, RN Outcome: Adequate for Discharge Goal: Diagnostic test results will improve 06/12/2023 0812 by Clearnce Sorrel, RN Outcome: Adequate for Discharge 06/12/2023 1601 by Clearnce Sorrel, RN Outcome: Adequate for Discharge 06/12/2023 [phone] by Clearnce Sorrel, RN Outcome: Adequate for Discharge Goal: Respiratory complications will improve 06/12/2023 0812 by Clearnce Sorrel, RN Outcome: Adequate for Discharge 06/12/2023 [phone] by Clearnce Sorrel, RN Outcome: Adequate for Discharge 06/12/2023 [phone] by Clearnce Sorrel, RN Outcome: Adequate for Discharge Goal: Cardiovascular complication will be avoided 06/12/[phone] by Clearnce Sorrel, RN Outcome: Adequate for Discharge 06/12/2023 [phone] by Clearnce Sorrel, RN Outcome: Adequate for Discharge 06/12/2023 [phone] by Clearnce Sorrel, RN Outcome: Adequate for Discharge   Problem: Activity: Goal: Risk for activity intolerance will decrease 06/12/2023 0812 by Clearnce Sorrel, RN Outcome: Adequate for Discharge 06/12/2023 [phone] by Clearnce Sorrel, RN Outcome: Adequate for Discharge 06/12/2023 [phone] by Clearnce Sorrel, RN Outcome: Adequate for Discharge   Problem: Nutrition: Goal: Adequate nutrition will be maintained 06/12/2023 0812 by Clearnce Sorrel, RN Outcome: Adequate for Discharge 06/12/2023 [phone] by Clearnce Sorrel, RN Outcome: Adequate for Discharge 06/12/2023 [phone] by Clearnce Sorrel, RN Outcome: Adequate for Discharge   Problem: Coping: Goal: Level of anxiety will decrease 06/12/2023 0812 by Clearnce Sorrel, RN Outcome: Adequate for Discharge 06/12/2023 [phone] by Clearnce Sorrel, RN Outcome: Adequate for Discharge 06/12/2023 [phone] by Clearnce Sorrel, RN Outcome: Adequate for Discharge   Problem: Elimination: Goal: Will not experience complications related to bowel motility 06/12/2023 0812 by Clearnce Sorrel, RN Outcome: Adequate for Discharge 06/12/2023 [phone] by Clearnce Sorrel, RN Outcome: Adequate for Discharge 06/12/2023 [phone] by Clearnce Sorrel, RN Outcome: Adequate for Discharge Goal: Will not experience complications related to urinary retention 06/12/2023 0812 by Clearnce Sorrel, RN Outcome: Adequate for Discharge 06/12/[phone] by Clearnce Sorrel, RN Outcome: Adequate for Discharge 06/12/2023 [phone] by Clearnce Sorrel, RN Outcome: Adequate for Discharge   Problem: Pain Management: Goal: General  experience of comfort will improve 06/12/2023 0812 by Clearnce Sorrel, RN Outcome: Adequate for Discharge 06/12/[phone] by Clearnce Sorrel, RN Outcome: Adequate for Discharge 06/12/2023 [phone] by  Clearnce Sorrel, RN Outcome: Adequate for Discharge   Problem: Safety: Goal: Ability to remain free from injury will improve 06/12/2023 0812 by Clearnce Sorrel, RN Outcome: Adequate for Discharge 06/12/2023 [phone] by Clearnce Sorrel, RN Outcome: Adequate for Discharge 06/12/2023 [phone] by Clearnce Sorrel, RN Outcome: Adequate for Discharge   Problem: Skin Integrity: Goal: Risk for impaired skin integrity will decrease 06/12/2023 0812 by Clearnce Sorrel, RN Outcome: Adequate for Discharge 06/12/2023 [phone] by Clearnce Sorrel, RN Outcome: Adequate for Discharge 06/12/2023 [phone] by Clearnce Sorrel, RN Outcome: Adequate for Discharge   Problem: Education: Goal: Ability to describe self-care measures that may prevent or decrease complications (Diabetes Survival Skills Education) will improve 06/12/2023 0812 by Clearnce Sorrel, RN Outcome: Adequate for Discharge 06/12/2023 [phone] by Clearnce Sorrel, RN Outcome: Adequate for Discharge 06/12/2023 [phone] by Clearnce Sorrel, RN Outcome: Adequate for Discharge Goal: Individualized Educational Video(s) 06/12/2023 0812 by Clearnce Sorrel, RN Outcome: Adequate for Discharge 06/12/2023 [phone] by Clearnce Sorrel, RN Outcome: Adequate for Discharge 06/12/2023 [phone] by Clearnce Sorrel, RN Outcome: Adequate for Discharge   Problem: Coping: Goal: Ability to adjust to condition or change in health will improve 06/12/2023 0812 by Clearnce Sorrel, RN Outcome: Adequate for Discharge 06/12/2023 [phone] by Clearnce Sorrel, RN Outcome: Adequate for Discharge 06/12/2023 [phone] by Clearnce Sorrel, RN Outcome: Adequate for Discharge   Problem:  Fluid Volume: Goal: Ability to maintain a balanced intake and output will improve 06/12/2023 0812 by Clearnce Sorrel, RN Outcome: Adequate for Discharge 06/12/2023 [phone] by Clearnce Sorrel, RN Outcome: Adequate for Discharge 06/12/2023 [phone] by Clearnce Sorrel, RN Outcome: Adequate for Discharge   Problem: Health Behavior/Discharge Planning: Goal: Ability to identify and utilize available resources and services will improve 06/12/2023 0812 by Clearnce Sorrel, RN Outcome: Adequate for Discharge 06/12/2023 0623 by Clearnce Sorrel, RN Outcome: Adequate for Discharge 06/12/[phone] by Clearnce Sorrel, RN Outcome: Adequate for Discharge Goal: Ability to manage health-related needs will improve 06/12/2023 0812 by Clearnce Sorrel, RN Outcome: Adequate for Discharge 06/12/[phone] by Clearnce Sorrel, RN Outcome: Adequate for Discharge 06/12/2023 [phone] by Clearnce Sorrel, RN Outcome: Adequate for Discharge   Problem: Metabolic: Goal: Ability to maintain appropriate glucose levels will improve 06/12/2023 0812 by Clearnce Sorrel, RN Outcome: Adequate for Discharge 06/12/2023 [phone] by Clearnce Sorrel, RN Outcome: Adequate for Discharge 06/12/2023 [phone] by Clearnce Sorrel, RN Outcome: Adequate for Discharge   Problem: Nutritional: Goal: Maintenance of adequate nutrition will improve 06/12/2023 0812 by Clearnce Sorrel, RN Outcome: Adequate for Discharge 06/12/2023 [phone] by Clearnce Sorrel, RN Outcome: Adequate for Discharge 06/12/2023 [phone] by Clearnce Sorrel, RN Outcome: Adequate for Discharge Goal: Progress toward achieving an optimal weight will improve 06/12/2023 0812 by Clearnce Sorrel, RN Outcome: Adequate for Discharge 06/12/2023 [phone] by Clearnce Sorrel, RN Outcome: Adequate for Discharge 06/12/2023 [phone] by Clearnce Sorrel, RN Outcome: Adequate for  Discharge   Problem: Skin Integrity: Goal: Risk for impaired skin integrity will decrease 06/12/2023 0812 by Clearnce Sorrel, RN Outcome: Adequate for Discharge 06/12/2023 [phone] by Clearnce Sorrel, RN Outcome: Adequate for Discharge 06/12/2023 [phone] by Clearnce Sorrel, RN Outcome: Adequate for Discharge  Problem: Tissue Perfusion: Goal: Adequacy of tissue perfusion will improve 06/12/2023 0812 by Clearnce Sorrel, RN Outcome: Adequate for Discharge 06/12/2023 [phone] by Clearnce Sorrel, RN Outcome: Adequate for Discharge 06/12/2023 [phone] by Clearnce Sorrel, RN Outcome: Adequate for Discharge

## 2023-06-13 LAB — PTH, INTACT AND CALCIUM
Calcium, Total (PTH): 6.1 mg/dL — CL (ref 8.7–10.2)
PTH: 104 pg/mL — ABNORMAL HIGH (ref 15–65)

## 2023-06-13 LAB — HEPATITIS B SURFACE ANTIBODY, QUANTITATIVE: Hep B S AB Quant (Post): 12.7 m[IU]/mL

## 2023-06-13 LAB — TACROLIMUS LEVEL: Tacrolimus (FK506) - LabCorp: 3.6 ng/mL (ref 2.0–20.0)

## 2023-06-13 NOTE — Progress Notes (Signed)
Late Note Entry- June 13, 2023  Pt was d/c yesterday. Contacted [facility] this morning to advise clinic of pt's d/c date and that pt should resume care tomorrow.   Olivia Canter Renal Navigator [phone]

## 2023-06-13 NOTE — TOC Transition Note (Signed)
Transition of Care - Initial Contact from Inpatient Facility  Date of discharge: 06/12/23 Date of contact: 06/13/23  Method: Phone  Spoke to: Patient  Patient contacted to discuss transition of care from recent inpatient hospitalization. Patient was admitted to [facility] from 06/09/23-06/12/23 with discharge diagnosis of uremia 2nd missed HD > 1 month, hypocalcemia, and N/V.  The discharge medication list was reviewed.   Patient will return to his outpatient HD unit on: 06/14/23 at [facility]  Salome Holmes, NP

## 2023-06-13 NOTE — Progress Notes (Signed)
06/13/23 2West Charge RN for day shift notified of critical lab result for patient that was discharged on 10/27. Calcium level from PTH panel 6.1.   Ophelia Charter, MD notified of critical result and states that she notified Dr. Thedore Mins from nephrology who is the director of the patient's dialysis center.

## 2023-06-14 ENCOUNTER — Other Ambulatory Visit (HOSPITAL_COMMUNITY): Payer: Self-pay | Admitting: Nephrology

## 2023-06-14 ENCOUNTER — Encounter (HOSPITAL_COMMUNITY): Payer: Self-pay

## 2023-06-14 DIAGNOSIS — N289 Disorder of kidney and ureter, unspecified: Secondary | ICD-10-CM

## 2023-06-14 HISTORY — PX: IR US GUIDE VASC ACCESS LEFT: IMG2389

## 2023-06-14 LAB — TACROLIMUS LEVEL: Tacrolimus (FK506) - LabCorp: 6.7 ng/mL (ref 2.0–20.0)

## 2023-10-10 ENCOUNTER — Encounter (HOSPITAL_COMMUNITY): Payer: Self-pay

## 2023-10-13 ENCOUNTER — Other Ambulatory Visit: Payer: Self-pay

## 2023-10-13 ENCOUNTER — Ambulatory Visit: Payer: 59 | Admitting: Physician Assistant

## 2023-10-13 DIAGNOSIS — M5441 Lumbago with sciatica, right side: Secondary | ICD-10-CM

## 2023-10-13 NOTE — Progress Notes (Signed)
 Office Visit Note   Patient: [name]           Date of Birth: 04-Jul-1971           MRN: [id number] Visit Date: 10/13/2023              Requested by: No referring provider defined for this encounter. PCP: Pcp, No   Assessment & Plan: Visit Diagnoses:  1. Acute midline low back pain with right-sided sciatica     Plan: Will send him to formal physical therapy for range of motion and core strengthening, modalities and home exercise program.  Will see him back in 6 weeks see how he is doing overall.  Questions encouraged and answered.  If his condition worsens in any way he will contact our office.  Follow-Up Instructions: Return in about 6 weeks (around 11/24/2023).   Orders:  Orders Placed This Encounter  Procedures   XR Lumbar Spine 2-3 Views   Ambulatory referral to Physical Therapy   No orders of the defined types were placed in this encounter.     Procedures: No procedures performed   Clinical Data: No additional findings.   Subjective: Chief Complaint  Patient presents with   Lower Back - Pain    HPI [name] is a 53 year old male comes in today with low back pain.  Denies any radicular symptoms.  He stated that the pain started this past Saturday morning.  Mid low back.  He ranks his pain 5 out of 10 pain.  Feels that the pain may have started after moving residences.  He has tried Tylenol rapid relief which has helped some.  History of kidney disease with a history of kidney transplant currently back on dialysis.  Denies any saddle anesthesia like symptoms, fevers, chills, or waking pain.  Review of Systems See HPI otherwise negative or noncontributory.  Objective: Vital Signs: There were no vitals taken for this visit.  Physical Exam Constitutional:      Appearance: He is not ill-appearing or diaphoretic.  Cardiovascular:     Pulses: Normal pulses.  Pulmonary:     Effort: Pulmonary effort is normal.  Neurological:     Mental Status: He is alert and  oriented to person, place, and time.  Psychiatric:        Mood and Affect: Mood normal.     Ortho Exam Lumbar spine: Negative straight leg raise bilaterally.  5 out of 5 strength throughout the lower extremities against resistance.  Sensation grossly intact bilateral feet to light touch throughout.  Tight hamstrings bilaterally.  Specialty Comments:  No specialty comments available.  Imaging: Lumbar spine 2 views: Loss of disc space at L4-5 large anterior endplate spurring at L4-5.  Facet arthritic changes L3-S1.  No acute fractures or acute findings.   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 22, 2017   Congestive dilated cardiomyopathy (HCC) 07/16/2015   Abnormal cardiovascular function study 07/16/2015   Preventative health care 04/24/2014   Tinea capitis 04/24/2014   Dental infection 10/16/2013   Hyperlipidemia with target low density lipoprotein (LDL) cholesterol less than 100 mg/dL 98/06/9146  Proliferative diabetic retinopathy (HCC) 05/24/2013   Diabetic macular edema (HCC) 05/24/2013   CHF (congestive heart failure) (HCC) 01/20/2012   DM2 (diabetes mellitus, type 2) (HCC) 01/15/2009   Anemia in chronic kidney disease 01/15/2009   Essential hypertension, benign 01/15/2009   Dilated cardiomyopathy (HCC) 01/15/2009   End stage renal disease (HCC) 01/15/2009   Secondary renal hyperparathyroidism (HCC) 01/15/2009   Past Medical History:  Diagnosis Date   Acute renal failure superimposed on stage 5 chronic kidney disease, not on chronic dialysis (HCC)  11/29/2017   Adequate hearing function    Anemia    CKD (chronic kidney disease), stage III (HCC)    Diabetes mellitus 12/2008   A1C 8.3 12/23/08, 24hr urine protein 2793 mg/day, SPEP neg, proliferative BDR s/p photocoagulation 08/2009 done at [facility] and f/u by DR Groat   DKA (diabetic ketoacidosis) (HCC) 12/24/2018   IMO SNOMED Dx Update Oct 2024     ESRD (end stage renal disease) on dialysis (HCC)    1.Initiated HD via right per cate 12/30/2008 (MWF schedule at [facility]) 2.Left upper extremity A-V fistula by Dr Darrick Penna 12/30/2008 3.Aemia: Received Venofer 12/30/08 Ferritin 179 % sat 12 12/23/08, 4. Secondary Hyperparathyroidism- PTH 188, P 5.4, Ca 9.2 12/2008   Has poorly balanced diet    Hemodialysis-associated hypotension    coreg d/c'd   Herniated lumbar disc without myelopathy    History of blood transfusion    Hyperlipemia    Hyperparathyroidism due to renal insufficiency (HCC)    Hypertension    resloved after initating HD, now with post HD hypotension   Hypocalcemia    Hypomagnesemia    Kidney transplant as cause of abnormal reaction or later complication 2015   [facility]   Non-ischemic cardiomyopathy (HCC)    EF 20-25% 12/23/2008>>EF 40-45% 03/2009>>EF ??? 7/?/2011   Obese    Occult blood positive stool    Psoriasis    scalp   Thrombocytopenia (HCC)    Vitamin B 12 deficiency     Family History  Problem Relation Age of Onset   Diabetes Father    Kidney disease Father    Other Father        amputation   Hypertension Mother    Dementia Mother    Diabetes Sister    Hypertension Sister    Diabetes Sister    Kidney disease Sister    Diabetes Sister    Hypertension Brother    Diabetes Brother    CAD Neg Hx    Sudden Cardiac Death Neg Hx    Congestive Heart Failure Neg Hx    Colon cancer Neg Hx    Rectal cancer Neg Hx    Esophageal cancer Neg Hx    Liver cancer Neg Hx     Past Surgical History:  Procedure Laterality Date   AV FISTULA PLACEMENT     left    AV FISTULA PLACEMENT  09/12/2012   Procedure: INSERTION OF ARTERIOVENOUS (AV) GORE-TEX GRAFT ARM;  Surgeon: Sherren Kerns, MD;  Location: [facility] OR;  Service: Vascular;  Laterality: Left;  KIDNEY TRANSPLANT FAILED   BIOPSY  02/01/2019   Procedure: BIOPSY;  Surgeon: Kerin Salen, MD;  Location: [facility] ENDOSCOPY;  Service: Gastroenterology;;   CATARACT EXTRACTION, BILATERAL     COLONOSCOPY WITH PROPOFOL N/A 02/01/2019   Procedure: COLONOSCOPY WITH PROPOFOL;  Surgeon: Kerin Salen, MD;  Location: [facility] ENDOSCOPY;  Service: Gastroenterology;  Laterality: N/A;   EYE SURGERY     INSERTION OF DIALYSIS CATHETER  07/14/2012  Procedure: INSERTION OF DIALYSIS CATHETER;  Surgeon: Pryor Ochoa, MD;  Location[facility] OR;  Service: Vascular;  Laterality: Right;  Ultrasound Guided Insertion of Internal Jugular Dialysis Catheter   IR THROMBECTOMY AV FISTULA W/THROMBOLYSIS/PTA INC/SHUNT/IMG LEFT Left 12/28/2018   IR THROMBECTOMY AV FISTULA W/THROMBOLYSIS/PTA INC/SHUNT/IMG LEFT Left 06/10/2023   IR US GUIDE VASC ACCESS LEFT  12/28/2018   IR US GUIDE VASC ACCESS LEFT  06/14/2023   KIDNEY TRANSPLANT  2013   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   KIDNEY TRANSPLANT  12/2013   second kidney transplant   lumbar disc sugery   10/17/2017   LUMBAR DISC SURGERY     NEPHRECTOMY TRANSPLANTED ORGAN  08/11/12   PHOTOCOAGULATION     for diabetic retinopathy 08/1999   SHUNTOGRAM N/A 07/11/2012   Procedure: Betsey Amen;  Surgeon: Nada Libman, MD;  Location: [facility] CATH LAB;  Service: Cardiovascular;  Laterality: N/A;   Social History   Occupational History   Occupation: disabled  Tobacco Use   Smoking status: Never   Smokeless tobacco: Never  Vaping Use   Vaping status: Never Used  Substance and Sexual Activity   Alcohol use: No    Alcohol/week: 0.0 standard drinks of alcohol   Drug use: No   Sexual activity: Not on file

## 2023-10-26 ENCOUNTER — Ambulatory Visit: Payer: 59 | Attending: Physician Assistant

## 2024-01-25 ENCOUNTER — Emergency Department (HOSPITAL_COMMUNITY)

## 2024-01-25 ENCOUNTER — Encounter (HOSPITAL_COMMUNITY): Payer: Self-pay | Admitting: *Deleted

## 2024-01-25 ENCOUNTER — Inpatient Hospital Stay (HOSPITAL_COMMUNITY)
Admission: EM | Admit: 2024-01-25 | Discharge: 2024-01-27 | DRG: 682 | Discharge status: Against Medical Advice | Attending: Family Medicine | Admitting: Family Medicine

## 2024-01-25 ENCOUNTER — Other Ambulatory Visit: Payer: Self-pay

## 2024-01-25 DIAGNOSIS — Z8249 Family history of ischemic heart disease and other diseases of the circulatory system: Secondary | ICD-10-CM

## 2024-01-25 DIAGNOSIS — Z841 Family history of disorders of kidney and ureter: Secondary | ICD-10-CM

## 2024-01-25 DIAGNOSIS — Z91158 Patient's noncompliance with renal dialysis for other reason: Secondary | ICD-10-CM

## 2024-01-25 DIAGNOSIS — R112 Nausea with vomiting, unspecified: Secondary | ICD-10-CM | POA: Diagnosis present

## 2024-01-25 DIAGNOSIS — Z91048 Other nonmedicinal substance allergy status: Secondary | ICD-10-CM

## 2024-01-25 DIAGNOSIS — Z992 Dependence on renal dialysis: Secondary | ICD-10-CM

## 2024-01-25 DIAGNOSIS — Z5329 Procedure and treatment not carried out because of patient's decision for other reasons: Secondary | ICD-10-CM | POA: Diagnosis not present

## 2024-01-25 DIAGNOSIS — N186 End stage renal disease: Principal | ICD-10-CM | POA: Diagnosis present

## 2024-01-25 DIAGNOSIS — Z91199 Patient's noncompliance with other medical treatment and regimen due to unspecified reason: Secondary | ICD-10-CM

## 2024-01-25 DIAGNOSIS — Z789 Other specified health status: Secondary | ICD-10-CM

## 2024-01-25 DIAGNOSIS — Y830 Surgical operation with transplant of whole organ as the cause of abnormal reaction of the patient, or of later complication, without mention of misadventure at the time of the procedure: Secondary | ICD-10-CM | POA: Diagnosis present

## 2024-01-25 DIAGNOSIS — I428 Other cardiomyopathies: Secondary | ICD-10-CM | POA: Diagnosis present

## 2024-01-25 DIAGNOSIS — I120 Hypertensive chronic kidney disease with stage 5 chronic kidney disease or end stage renal disease: Principal | ICD-10-CM | POA: Diagnosis present

## 2024-01-25 DIAGNOSIS — E538 Deficiency of other specified B group vitamins: Secondary | ICD-10-CM | POA: Diagnosis present

## 2024-01-25 DIAGNOSIS — T8612 Kidney transplant failure: Secondary | ICD-10-CM | POA: Diagnosis present

## 2024-01-25 DIAGNOSIS — N2581 Secondary hyperparathyroidism of renal origin: Secondary | ICD-10-CM | POA: Diagnosis present

## 2024-01-25 DIAGNOSIS — E785 Hyperlipidemia, unspecified: Secondary | ICD-10-CM | POA: Diagnosis present

## 2024-01-25 DIAGNOSIS — E11319 Type 2 diabetes mellitus with unspecified diabetic retinopathy without macular edema: Secondary | ICD-10-CM | POA: Diagnosis present

## 2024-01-25 DIAGNOSIS — Z833 Family history of diabetes mellitus: Secondary | ICD-10-CM

## 2024-01-25 DIAGNOSIS — Y838 Other surgical procedures as the cause of abnormal reaction of the patient, or of later complication, without mention of misadventure at the time of the procedure: Secondary | ICD-10-CM | POA: Diagnosis present

## 2024-01-25 DIAGNOSIS — N19 Unspecified kidney failure: Secondary | ICD-10-CM

## 2024-01-25 DIAGNOSIS — E1122 Type 2 diabetes mellitus with diabetic chronic kidney disease: Secondary | ICD-10-CM | POA: Diagnosis present

## 2024-01-25 DIAGNOSIS — Z7982 Long term (current) use of aspirin: Secondary | ICD-10-CM | POA: Diagnosis not present

## 2024-01-25 DIAGNOSIS — D631 Anemia in chronic kidney disease: Secondary | ICD-10-CM | POA: Diagnosis present

## 2024-01-25 DIAGNOSIS — Z79621 Long term (current) use of calcineurin inhibitor: Secondary | ICD-10-CM | POA: Diagnosis not present

## 2024-01-25 DIAGNOSIS — D638 Anemia in other chronic diseases classified elsewhere: Secondary | ICD-10-CM | POA: Diagnosis present

## 2024-01-25 DIAGNOSIS — E739 Lactose intolerance, unspecified: Secondary | ICD-10-CM | POA: Diagnosis present

## 2024-01-25 DIAGNOSIS — D649 Anemia, unspecified: Secondary | ICD-10-CM

## 2024-01-25 DIAGNOSIS — M898X9 Other specified disorders of bone, unspecified site: Secondary | ICD-10-CM | POA: Diagnosis present

## 2024-01-25 DIAGNOSIS — E8721 Acute metabolic acidosis: Secondary | ICD-10-CM | POA: Diagnosis present

## 2024-01-25 DIAGNOSIS — Z905 Acquired absence of kidney: Secondary | ICD-10-CM | POA: Diagnosis not present

## 2024-01-25 LAB — COMPREHENSIVE METABOLIC PANEL WITH GFR
ALT: 15 U/L (ref 0–44)
AST: 20 U/L (ref 15–41)
Albumin: 4 g/dL (ref 3.5–5.0)
Alkaline Phosphatase: 69 U/L (ref 38–126)
Anion gap: 19 — ABNORMAL HIGH (ref 5–15)
BUN: 188 mg/dL — ABNORMAL HIGH (ref 6–20)
CO2: 13 mmol/L — ABNORMAL LOW (ref 22–32)
Calcium: 6.5 mg/dL — ABNORMAL LOW (ref 8.9–10.3)
Chloride: 106 mmol/L (ref 98–111)
Creatinine, Ser: 28.75 mg/dL — ABNORMAL HIGH (ref 0.61–1.24)
GFR, Estimated: 2 mL/min — ABNORMAL LOW (ref >60)
Glucose, Bld: 268 mg/dL — ABNORMAL HIGH (ref 70–99)
Potassium: 5.1 mmol/L (ref 3.5–5.1)
Sodium: 138 mmol/L (ref 135–145)
Total Bilirubin: 0.6 mg/dL (ref 0.0–1.2)
Total Protein: 7.4 g/dL (ref 6.5–8.1)

## 2024-01-25 LAB — URINALYSIS, ROUTINE W REFLEX MICROSCOPIC
Bacteria, UA: NONE SEEN
Bilirubin Urine: NEGATIVE
Glucose, UA: 150 mg/dL — AB
Ketones, ur: NEGATIVE mg/dL
Leukocytes,Ua: NEGATIVE
Nitrite: NEGATIVE
Protein, ur: >=300 mg/dL — AB
Specific Gravity, Urine: 1.013 (ref 1.005–1.030)
pH: 5 (ref 5.0–8.0)

## 2024-01-25 LAB — CBC WITH DIFFERENTIAL/PLATELET
Abs Immature Granulocytes: 0.02 10*3/uL (ref 0.00–0.07)
Basophils Absolute: 0 10*3/uL (ref 0.0–0.1)
Basophils Relative: 0 %
Eosinophils Absolute: 0.2 10*3/uL (ref 0.0–0.5)
Eosinophils Relative: 2 %
HCT: 24.5 % — ABNORMAL LOW (ref 39.0–52.0)
Hemoglobin: 7.9 g/dL — ABNORMAL LOW (ref 13.0–17.0)
Immature Granulocytes: 0 %
Lymphocytes Relative: 20 %
Lymphs Abs: 1.5 10*3/uL (ref 0.7–4.0)
MCH: 28 pg (ref 26.0–34.0)
MCHC: 32.2 g/dL (ref 30.0–36.0)
MCV: 86.9 fL (ref 80.0–100.0)
Monocytes Absolute: 0.5 10*3/uL (ref 0.1–1.0)
Monocytes Relative: 7 %
Neutro Abs: 5.3 10*3/uL (ref 1.7–7.7)
Neutrophils Relative %: 71 %
Platelets: 63 10*3/uL — ABNORMAL LOW (ref 150–400)
RBC: 2.82 MIL/uL — ABNORMAL LOW (ref 4.22–5.81)
RDW: 15.9 % — ABNORMAL HIGH (ref 11.5–15.5)
WBC: 7.5 10*3/uL (ref 4.0–10.5)
nRBC: 0 % (ref 0.0–0.2)

## 2024-01-25 LAB — LIPASE, BLOOD: Lipase: 68 U/L — ABNORMAL HIGH (ref 11–51)

## 2024-01-25 LAB — TYPE AND SCREEN
ABO/RH(D): O POS
Antibody Screen: NEGATIVE

## 2024-01-25 LAB — BRAIN NATRIURETIC PEPTIDE: B Natriuretic Peptide: 55.6 pg/mL (ref 0.0–100.0)

## 2024-01-25 MED ORDER — TACROLIMUS 1 MG PO CAPS
1.0000 mg | ORAL_CAPSULE | Freq: Two times a day (BID) | ORAL | Status: DC
  Administered 2024-01-26 – 2024-01-27 (×3): 1 mg via ORAL
  Filled 2024-01-25 (×4): qty 1

## 2024-01-25 MED ORDER — INSULIN ASPART 100 UNIT/ML IJ SOLN
0.0000 [IU] | Freq: Three times a day (TID) | INTRAMUSCULAR | Status: DC
  Administered 2024-01-27: 2 [IU] via SUBCUTANEOUS

## 2024-01-25 MED ORDER — ACETAMINOPHEN 325 MG PO TABS: 650.0000 mg | ORAL_TABLET | Freq: Four times a day (QID) | ORAL | Status: DC | PRN

## 2024-01-25 MED ORDER — PANTOPRAZOLE SODIUM 40 MG PO TBEC
40.0000 mg | DELAYED_RELEASE_TABLET | Freq: Every day | ORAL | Status: DC
  Administered 2024-01-26 – 2024-01-27 (×2): 40 mg via ORAL
  Filled 2024-01-25 (×2): qty 1

## 2024-01-25 MED ORDER — HEPARIN SODIUM (PORCINE) 5000 UNIT/ML IJ SOLN
5000.0000 [IU] | Freq: Three times a day (TID) | INTRAMUSCULAR | Status: DC
  Administered 2024-01-26 – 2024-01-27 (×5): 5000 [IU] via SUBCUTANEOUS
  Filled 2024-01-25 (×5): qty 1

## 2024-01-25 MED ORDER — METOCLOPRAMIDE HCL 5 MG PO TABS: 5.0000 mg | ORAL_TABLET | Freq: Four times a day (QID) | ORAL | Status: DC | PRN

## 2024-01-25 MED ORDER — ACETAMINOPHEN 650 MG RE SUPP: 650.0000 mg | Freq: Four times a day (QID) | RECTAL | Status: DC | PRN

## 2024-01-25 MED ORDER — TACROLIMUS 1 MG PO CAPS: 3.0000 mg | ORAL_CAPSULE | Freq: Two times a day (BID) | ORAL | Status: DC

## 2024-01-25 MED ORDER — DAPSONE 25 MG PO TABS
50.0000 mg | ORAL_TABLET | Freq: Every day | ORAL | Status: DC
  Administered 2024-01-26 – 2024-01-27 (×2): 50 mg via ORAL
  Filled 2024-01-25 (×2): qty 2

## 2024-01-25 NOTE — ED Notes (Signed)
 CCMD has been contacted to put patient on cardiac monitoring.

## 2024-01-25 NOTE — Assessment & Plan Note (Signed)
 HTN- not on any home medications. Monitor with vitals.  Type 2 diabetes mellitus-CBGs before every meal and at bedtime, very sensitive sliding scale insulin 

## 2024-01-25 NOTE — ED Provider Notes (Signed)
 [place] [facility] Provider Note   CSN: [id number] Arrival date & time: 01/25/24  1510     History  Chief Complaint  Patient presents with   Vascular Access Problem    [name] is a 53 y.o. male.  Patient is a 53 year old male who presents needing dialysis.  He has a history of failed kidney transplants.  He does not go to dialysis consistently.  The last time he went was about 6 weeks ago.  He was advised that if he misses over 5 weeks, he has to come to the hospital to start dialysis again.  He had some nausea and vomiting for the last couple days but he said he felt a little better today.  No diarrhea.  No associated abdominal pain.  No fevers.  No shortness of breath.  No leg swelling.  He denies any blood in his stool or melena.       Home Medications Prior to Admission medications   Medication Sig Start Date End Date Taking? Authorizing Provider  Accu-Chek FastClix Lancets MISC 1 each by In Vitro route 4 (four) times daily. DX:E11.9 02/13/19   Verma Gobble, NP  acetaminophen  (TYLENOL ) 500 MG tablet Take 500 mg by mouth every 6 (six) hours as needed for mild pain (pain score 1-3).    [provider]  aspirin  EC 81 MG tablet Take 81 mg by mouth daily.    [provider]  B Complex-C-Folic Acid  (RENA-VITE RX) 1 MG TABS Take 1 tablet by mouth daily. 06/12/21   [provider]  calcium  carbonate (OSCAL) 1500 (600 Ca) MG TABS tablet Take 1,500 mg by mouth 2 (two) times daily with a meal.    [provider]  calcium  carbonate (TUMS SMOOTHIES) 750 MG chewable tablet Chew 1 tablet by mouth as needed (heartburn, dialysis). 04/17/19   [provider]  dapsone  25 MG tablet Take 50 mg by mouth daily.    [provider]  glucose blood (ACCU-CHEK AVIVA PLUS) test strip 1 each by Other route 4 (four) times daily. DX:E11.9 02/13/19   Verma Gobble, NP  metoCLOPramide  (REGLAN ) 5 MG tablet  Take 1 tablet (5 mg total) by mouth every 6 (six) hours as needed for nausea or vomiting. 06/12/23   Lorita Rosa, MD  omeprazole  (PRILOSEC) 20 MG capsule Take 1 capsule (20 mg total) by mouth daily. 12/20/17   Verma Gobble, NP  tacrolimus  (PROGRAF ) 1 MG capsule Take 3 capsules (3 mg total) by mouth 2 (two) times daily. 02/05/19   Hoyt Macleod, MD      Allergies    Lactose intolerance (gi), Other, Pollen extract, and Tape    Review of Systems   Review of Systems  Constitutional:  Negative for chills, diaphoresis, fatigue and fever.  HENT:  Negative for congestion, rhinorrhea and sneezing.   Eyes: Negative.   Respiratory:  Negative for cough, chest tightness and shortness of breath.   Cardiovascular:  Negative for chest pain and leg swelling.  Gastrointestinal:  Positive for nausea and vomiting. Negative for abdominal pain, blood in stool and diarrhea.  Genitourinary:  Negative for difficulty urinating, flank pain, frequency and hematuria.  Musculoskeletal:  Negative for arthralgias and back pain.  Skin:  Negative for rash.  Neurological:  Negative for dizziness, speech difficulty, weakness, numbness and headaches.    Physical Exam Updated Vital Signs BP (!) 158/80   Pulse 75   Temp 97.7 F (36.5 C)   Resp  11   Ht 5' 5 (1.651 m)   Wt 62.6 kg   SpO2 100%   BMI 22.97 kg/m  Physical Exam Constitutional:      Appearance: He is well-developed.  HENT:     Head: Normocephalic and atraumatic.  Eyes:     Pupils: Pupils are equal, round, and reactive to light.  Cardiovascular:     Rate and Rhythm: Normal rate and regular rhythm.     Heart sounds: Normal heart sounds.  Pulmonary:     Effort: Pulmonary effort is normal. No respiratory distress.     Breath sounds: Normal breath sounds. No wheezing or rales.  Chest:     Chest wall: No tenderness.  Abdominal:     General: Bowel sounds are normal.     Palpations: Abdomen is soft.     Tenderness: There is no abdominal  tenderness. There is no guarding or rebound.  Musculoskeletal:        General: Normal range of motion.     Cervical back: Normal range of motion and neck supple.     Comments: Fistula in place to his right forearm.  There is a small thrill noted  Lymphadenopathy:     Cervical: No cervical adenopathy.  Skin:    General: Skin is warm and dry.     Findings: No rash.  Neurological:     Mental Status: He is alert and oriented to person, place, and time.     ED Results / Procedures / Treatments   Labs (all labs ordered are listed, but only abnormal results are displayed) Labs Reviewed  CBC WITH DIFFERENTIAL/PLATELET - Abnormal; Notable for the following components:      Result Value   RBC 2.82 (*)    Hemoglobin 7.9 (*)    HCT 24.5 (*)    RDW 15.9 (*)    Platelets 63 (*)    All other components within normal limits  COMPREHENSIVE METABOLIC PANEL WITH GFR - Abnormal; Notable for the following components:   CO2 13 (*)    Glucose, Bld 268 (*)    BUN 188 (*)    Creatinine, Ser 28.75 (*)    Calcium  6.5 (*)    GFR, Estimated 2 (*)    Anion gap 19 (*)    All other components within normal limits  LIPASE, BLOOD - Abnormal; Notable for the following components:   Lipase 68 (*)    All other components within normal limits  URINALYSIS, ROUTINE W REFLEX MICROSCOPIC - Abnormal; Notable for the following components:   APPearance HAZY (*)    Glucose, UA 150 (*)    Hgb urine dipstick MODERATE (*)    Protein, ur >=300 (*)    All other components within normal limits  BRAIN NATRIURETIC PEPTIDE  TYPE AND SCREEN    EKG EKG Interpretation Date/Time:  Wednesday January 25 2024 15:23:00 EDT Ventricular Rate:  96 PR Interval:  136 QRS Duration:  84 QT Interval:  410 QTC Calculation: 517 R Axis:   -34  Text Interpretation: Normal sinus rhythm Left axis deviation Pulmonary disease pattern Nonspecific T wave abnormality Abnormal ECG When compared with ECG of 11-Jun-2023 13:19, PREVIOUS ECG IS  PRESENT since last tracing no significant change Confirmed by Hershel Los [phone]) on 01/25/2024 9:31:28 PM  Radiology DG Chest 2 View Result Date: 01/25/2024 CLINICAL DATA:  Shortness of breath. EXAM: CHEST - 2 VIEW COMPARISON:  01/30/2019. FINDINGS: Bilateral lung fields are clear. Bilateral costophrenic angles are clear. Normal cardio-mediastinal silhouette. Redemonstration of  vascular stent in the left paramedian upper anterior hemithorax. No acute osseous abnormalities. The soft tissues are within normal limits. IMPRESSION: No active cardiopulmonary disease. Electronically Signed   By: Beula Brunswick M.D.   On: 01/25/2024 16:35    Procedures Procedures    Medications Ordered in ED Medications - No data to display  ED Course/ Medical Decision Making/ A&P                                 Medical Decision Making Amount and/or Complexity of Data Reviewed Labs: ordered.   This patient presents to the ED for concern of nausea/vomiting, this involves an extensive number of treatment options, and is a complaint that carries with it a high risk of complications and morbidity.  I considered the following differential and admission for this acute, potentially life threatening condition.  The differential diagnosis includes uremia, bowel obstruction, infection, hyperkalemia  MDM:    Patient presents needing dialysis.  He has had nausea and vomiting for the last few days but otherwise does not have complaints.  No shortness of breath or suggestions of fluid overload.  His potassium is normal.  His creatinine is markedly elevated likely explain his symptoms.  His hemoglobin is slightly lower than prior values.  He denies any hematochezia or melena.  This is likely from his worsening renal function.  Discussed with nephrology, Dr. Edson Graces, he recommends admission to the medicine service and they will initiate dialysis tomorrow.  Discussed with the family medicine resident he will admit the patient  for further treatment.  (Labs, imaging, consults)  Labs: I Ordered, and personally interpreted labs.  The pertinent results include: Elevated creatinine, anemia  Imaging Studies ordered: I ordered imaging studies including chest x-ray I independently visualized and interpreted imaging. I agree with the radiologist interpretation  Additional history obtained from chart.  External records from outside source obtained and reviewed including prior admission record  Cardiac Monitoring: The patient was maintained on a cardiac monitor.  If on the cardiac monitor, I personally viewed and interpreted the cardiac monitored which showed an underlying rhythm of: Sinus rhythm  Reevaluation: After the interventions noted above, I reevaluated the patient and found that they have :stayed the same  Social Determinants of Health:  noncompliant  Disposition: Admitted to hospital  Co morbidities that complicate the patient evaluation  Past Medical History:  Diagnosis Date   Acute renal failure superimposed on stage 5 chronic kidney disease, not on chronic dialysis (HCC) 11/29/2017   Adequate hearing function    Anemia    CKD (chronic kidney disease), stage III (HCC)    Diabetes mellitus 12/2008   A1C 8.3 12/23/08, 24hr urine protein 2793 mg/day, SPEP neg, 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 Secondary Hyperparathyroidism- PTH 188, P 5.4, Ca 9.2 12/2008   Has poorly balanced diet    Hemodialysis-associated hypotension    coreg  d/c'd   Herniated lumbar disc without myelopathy    History of blood transfusion     Hyperlipemia    Hyperparathyroidism due  to renal insufficiency (HCC)    Hypertension    resloved after initating HD, now with post HD hypotension   Hypocalcemia    Hypomagnesemia    Kidney transplant as cause of abnormal reaction or later complication 2015   [facility]   Non-ischemic cardiomyopathy (HCC)    EF 20-25% 12/23/2008>>EF 40-45% 03/2009>>EF ??? 7/?/2011   Obese    Occult blood positive stool    Psoriasis    scalp   Thrombocytopenia (HCC)    Vitamin B 12 deficiency      Medicines No orders of the defined types were placed in this encounter.   I have reviewed the patients home medicines and have made adjustments as needed  Problem List / ED Course: Problem List Items Addressed This Visit       Other   Uremia   Other Visit Diagnoses       ESRD (end stage renal disease) (HCC)    -  Primary     Anemia, unspecified type                 Final Clinical Impression(s) / ED Diagnoses Final diagnoses:  ESRD (end stage renal disease) (HCC)  Uremia  Anemia, unspecified type    Rx / DC Orders ED Discharge Orders     None         Hershel Los, MD 01/25/24 2140

## 2024-01-25 NOTE — Assessment & Plan Note (Addendum)
 Nephrology is consulted for dialysis needs.  Plan as below: - Admit to FMTS, Dr. Dawn Eth attending - Strict I's and O's - Renal diet with fluid restriction - Dialysis per nephrology, likely tomorrow - Continue home tacrolimus  1 mg and dapsone  50 mg - Hold nephrotoxic agents -Monitor kidney function with a.m. RFP -Reglan  5 mg as needed for nausea - PT/OT eval and treat

## 2024-01-25 NOTE — ED Provider Triage Note (Signed)
 Emergency Medicine Provider Triage Evaluation Note  [name] , a 52 y.o. male  was evaluated in triage.  Pt complains of need for dialysis. H/o ESRD and kidney transplant.  Has missed dialysis for 6 weeks.  He endorses shortness of breath but no chest pain.  Endorses vomiting but no nausea or abdominal pain.  Still makes urine.  States a lot has been going on prompting him to miss his dialysis.  Review of Systems  Positive: See above Negative: See above  Physical Exam  BP (!) 158/83   Pulse 95   Temp 98.4 F (36.9 C)   Resp 17   Ht 5' 5 (1.651 m)   Wt 62.6 kg   SpO2 100%   BMI 22.97 kg/m  Gen:   Awake, no distress   Resp:  Normal effort  MSK:   Moves extremities without difficulty  Other:    Medical Decision Making  Medically screening exam initiated at 3:43 PM.  Appropriate orders placed.  [name] was informed that the remainder of the evaluation will be completed by another provider, this initial triage assessment does not replace that evaluation, and the importance of remaining in the ED until their evaluation is complete.  Work up started   Janalee Mcmurray, PA-C 01/25/24 1545

## 2024-01-25 NOTE — H&P (Addendum)
 Hospital Admission History and Physical Service Pager: [phone]  Patient name: [name] Medical record number: [id number] Date of Birth: 1971/04/10 Age: 53 y.o. Gender: male  Primary Care Provider: Pcp, No Consultants: Nephrology Code Status: Full Code  Preferred Emergency Contact:  Contact Information     Name Relation Home Work Mobile   [place] Spouse [phone]  [phone]      Other Contacts   None on File      Chief Complaint: N/V  Assessment and Plan: [name] is a 53 y.o. male presenting with nausea and vomiting secondary to uremia from missing dialysis. He has a PMH of failed allograft of kidney transplant x 2, ESRD on dialysis, T2DM, and HTN. Differential for presentation of this includes viral illness, uremia from missed dialysis, GERD, gastritis. Patient is currently hemodynamically stable, and has not had any hematemesis, making a bleeding ulcer or other acute bleed less likely.   Most likely cause of presentation is uremia due to assisted dialysis.  Patient has missed 6 weeks worth of dialysis.  Continues to have residual kidney function as does make about 3 cups of urine.  Uremia without mental alteration.  Electrolytes stable. Likely dialysis tomorrow.  Assessment & Plan ESRD (end stage renal disease) [facility]) Nephrology is consulted for dialysis needs.  Plan as below: - Admit to FMTS, Dr. Dawn Eth attending - Strict I's and O's - Renal diet with fluid restriction - Dialysis per nephrology, likely tomorrow - Continue home tacrolimus  1 mg and dapsone  50 mg - Hold nephrotoxic agents -Monitor kidney function with a.m. RFP -Reglan  5 mg as needed for nausea - PT/OT eval and treat Anemia, chronic disease Patient's baseline appears to be around 9-10, currently 7.9.  No evidence of hematemesis or hematochezia.  Likely in the setting of worsening renal function. -Monitor CBC -Transfusion threshold less than 7 -Consider supplementations per  nephrology Chronic health problem HTN- not on any home medications. Monitor with vitals.  Type 2 diabetes mellitus-CBGs before every meal and at bedtime, very sensitive sliding scale insulin   FEN/GI: Renal diet with fluid restriction VTE Prophylaxis: heparin   Disposition: Progressive care  History of Present Illness:  [name] is a 53 y.o. male presenting with nausea, vomiting, and decreased appetite after missing multiple weeks of dialysis.  Presents to the emergency department after being told by his dialysis center that he would need to be seen here prior to being accepted back at his dialysis center.  Has been approximately 6 weeks since his last dialysis.  Denies chest pain shortness of breath recent illness.  No sick contacts.  In the ED, BMP revealed creatinine of 28, BUN of 188.  Nephrology consulted for dialysis needs.  Review Of Systems: Per HPI   Pertinent Past Medical History: Kidney transplant x2 Allograft failure x 2 ESRD T2DM HTN Anemia of CKD  Remainder reviewed in history tab.   Pertinent Past Surgical History: Kidney transplant, thrombosis of allograft Kidney transplant, failure of Allograft Nephrectomy of transplanted organ   Remainder reviewed in history tab.  Pertinent Social History: Tobacco use: No Alcohol use: no Other Substance use: no Lives with wife in long term hotel stay  Pertinent Family History: Mother, deceased Aneurysm; HTN, dementia Father, deceased ESRD/sepsis; DM  Remainder reviewed in history tab.   Important Outpatient Medications: Aspirin  Dapsone  Tums Reglan  Prilosec Tacrolimus   Remainder reviewed in medication history.   Objective: BP (!) 158/80   Pulse 75   Temp 97.7 F (36.5 C)   Resp 11  Ht 5' 5 (1.651 m)   Wt 62.6 kg   SpO2 100%   BMI 22.97 kg/m  Exam: General: Thin, otherwise well-appearing, no distress Eyes: PERRLA, EOMI no evidence of scleral icterus Cardiovascular: RRR, no M/R/G Respiratory:  CTAB, no increased work of breathing Gastrointestinal: Flat, soft, nontender MSK: Generalized weakness, tremor at rest.  Some rhythmic hand flapping concerning asterixis. Neuro: Alert and oriented to self time place and situation.  Speech and mentation appear appropriate.  Labs:  CBC BMET  Recent Labs  Lab 01/25/24 1546  WBC 7.5  HGB 7.9*  HCT 24.5*  PLT 63*   Recent Labs  Lab 01/25/24 1546  NA 138  K 5.1  CL 106  CO2 13*  BUN 188*  CREATININE 28.75*  GLUCOSE 268*  CALCIUM  6.5*     EKG: NSR, no obvious T-wave abnormalities, prolonged QTc  Imaging Studies Performed:  DG Chest 2 view Impression: No active cardiopulmonary disease.   Alexis Calandra, DO 01/25/2024, 9:46 PM PGY-1, [facility]  FPTS Intern pager: [phone], text pages welcome Secure chat group [facility] Teaching Service

## 2024-01-25 NOTE — Assessment & Plan Note (Signed)
 Patient's baseline appears to be around 9-10, currently 7.9.  No evidence of hematemesis or hematochezia.  Likely in the setting of worsening renal function. -Monitor CBC -Transfusion threshold less than 7 -Consider supplementations per nephrology

## 2024-01-25 NOTE — ED Triage Notes (Signed)
 Kidney transplant pt and he voids  but he is still dialysis

## 2024-01-25 NOTE — Hospital Course (Addendum)
 [name] is a 53 y.o. male who was admitted to the [facility] Teaching Service at [facility] for uremia secondary to multiple missed sessions of dialysis. Hospital course is outlined below by problem.   ESRD requiring dialysis Patient presented to the ED after being told by his dialysis center he would need to be seen at the hospital prior to reestablishing care.  On admission patient's BUN was 188 and his creatinine was 28.  Nephrology was consulted to initiate dialysis while in the hospital.  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  He does still have residual kidney function and makes about 3 cups of urine per day.  His electrolytes and kidney markers improved with dialysis.  At time of discharge nephrology team had coordinated ongoing outpatient dialysis.   However, on 01/27/24 the next available outpatient dialysis appointment that the patient can make it to is on Tuesday 6/17. The patient was advised to remain in the hospital on 6/13 to get HD on 6/14 before discharge; however, he declined and stated he would start outpatient on 6/17 instead. Patient left AGAINST MEDICAL ADVICE.  Anemia of chronic disease On admission patient had a hemoglobin of 7.9, based on prior labs his baseline appeared to be between 9 - 10.  No evidence of hematemesis or hematochezia on admission.  Patient was monitored and did not require transfusion while admitted.  Other conditions that were chronic and stable: HTN, T2DM  Issues for follow up: Ensure HD sessions as scheduled. Monitor BP and titrate meds as needed. Started amlodipine  10 mg inpatient.  Nephro transplant team to taper immunosuppressive therapy.

## 2024-01-25 NOTE — ED Triage Notes (Signed)
 The pt has missed dialysis for 6 weeks  ?? Reason he has a fistula lt arm

## 2024-01-26 DIAGNOSIS — N186 End stage renal disease: Secondary | ICD-10-CM | POA: Diagnosis not present

## 2024-01-26 LAB — CBC
HCT: 23.7 % — ABNORMAL LOW (ref 39.0–52.0)
Hemoglobin: 7.7 g/dL — ABNORMAL LOW (ref 13.0–17.0)
MCH: 27.7 pg (ref 26.0–34.0)
MCHC: 32.5 g/dL (ref 30.0–36.0)
MCV: 85.3 fL (ref 80.0–100.0)
Platelets: 64 10*3/uL — ABNORMAL LOW (ref 150–400)
RBC: 2.78 MIL/uL — ABNORMAL LOW (ref 4.22–5.81)
RDW: 16 % — ABNORMAL HIGH (ref 11.5–15.5)
WBC: 8.7 10*3/uL (ref 4.0–10.5)
nRBC: 0 % (ref 0.0–0.2)

## 2024-01-26 LAB — IRON AND TIBC
Iron: 167 ug/dL (ref 45–182)
Saturation Ratios: 86 % — ABNORMAL HIGH (ref 17.9–39.5)
TIBC: 195 ug/dL — ABNORMAL LOW (ref 250–450)
UIBC: 28 ug/dL

## 2024-01-26 LAB — HEMOGLOBIN A1C
Hgb A1c MFr Bld: 7.5 % — ABNORMAL HIGH (ref 4.8–5.6)
Mean Plasma Glucose: 168.55 mg/dL

## 2024-01-26 LAB — RENAL FUNCTION PANEL
Albumin: 3.9 g/dL (ref 3.5–5.0)
Anion gap: 22 — ABNORMAL HIGH (ref 5–15)
BUN: 190 mg/dL — ABNORMAL HIGH (ref 6–20)
CO2: 11 mmol/L — ABNORMAL LOW (ref 22–32)
Calcium: 6.5 mg/dL — ABNORMAL LOW (ref 8.9–10.3)
Chloride: 106 mmol/L (ref 98–111)
Creatinine, Ser: 29.07 mg/dL — ABNORMAL HIGH (ref 0.61–1.24)
GFR, Estimated: 2 mL/min — ABNORMAL LOW (ref >60)
Glucose, Bld: 106 mg/dL — ABNORMAL HIGH (ref 70–99)
Phosphorus: 10.1 mg/dL — ABNORMAL HIGH (ref 2.5–4.6)
Potassium: 5.2 mmol/L — ABNORMAL HIGH (ref 3.5–5.1)
Sodium: 139 mmol/L (ref 135–145)

## 2024-01-26 LAB — GLUCOSE, CAPILLARY
Glucose-Capillary: 102 mg/dL — ABNORMAL HIGH (ref 70–99)
Glucose-Capillary: 136 mg/dL — ABNORMAL HIGH (ref 70–99)
Glucose-Capillary: 107 mg/dL — ABNORMAL HIGH (ref 70–99)

## 2024-01-26 LAB — FERRITIN: Ferritin: 747 ng/mL — ABNORMAL HIGH (ref 24–336)

## 2024-01-26 LAB — HEPATITIS B SURFACE ANTIGEN: Hepatitis B Surface Ag: NONREACTIVE

## 2024-01-26 MED ORDER — DOXERCALCIFEROL 4 MCG/2ML IV SOLN
INTRAVENOUS | Status: AC
  Filled 2024-01-26: qty 4

## 2024-01-26 MED ORDER — DOXERCALCIFEROL 4 MCG/2ML IV SOLN
5.0000 ug | INTRAVENOUS | Status: DC
  Administered 2024-01-26: 5 ug via INTRAVENOUS

## 2024-01-26 MED ORDER — DARBEPOETIN ALFA 200 MCG/0.4ML IJ SOSY
200.0000 ug | PREFILLED_SYRINGE | INTRAMUSCULAR | Status: DC
  Administered 2024-01-26: 200 ug via SUBCUTANEOUS
  Filled 2024-01-26: qty 0.4

## 2024-01-26 MED ORDER — AMLODIPINE BESYLATE 10 MG PO TABS
10.0000 mg | ORAL_TABLET | Freq: Every day | ORAL | Status: DC
  Administered 2024-01-26: 10 mg via ORAL
  Filled 2024-01-26: qty 1

## 2024-01-26 MED ORDER — CALCIUM ACETATE (PHOS BINDER) 667 MG PO CAPS
2001.0000 mg | ORAL_CAPSULE | Freq: Three times a day (TID) | ORAL | Status: DC
  Administered 2024-01-26 – 2024-01-27 (×3): 2001 mg via ORAL
  Filled 2024-01-26 (×3): qty 3

## 2024-01-26 NOTE — Progress Notes (Signed)
 OT Cancellation Note  Patient Details Name: [name] MRN: [id number] DOB: July 17, 1971   Cancelled Treatment:    Reason Eval/Treat Not Completed: Patient at procedure or test/ unavailable (pt at HD. Will assess at a later time.)  [facility] 01/26/2024, 12:12 PM Milburn Aliment, OT/L   Acute OT Clinical Specialist Acute Rehabilitation Services Pager [phone] Office [phone]

## 2024-01-26 NOTE — Assessment & Plan Note (Signed)
 Likely secondary to chronic disease.  Baseline ~9-10, currently 7.7.  No evidence of hematemesis or hematochezia.  Likely decreased in the setting of worsening renal function. - Monitor CBC - Transfusion threshold < 7 - Consider supplementations per nephrology

## 2024-01-26 NOTE — Consult Note (Signed)
 [facility] Renal Consultation Note    Indication for Consultation:  Management of ESRD/hemodialysis; anemia, hypertension/volume and secondary hyperparathyroidism PCP: [place] Nephrologist: Dr. Zelda Hickman  HPI: [name] is a 53 y.o. male with ESRD on hemodialysis T,Th,S at [facility]. 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 2nd DDRT (100% PRA w/ KDPI>85, CMV +) performed at [facility] 12/26/13 c/b allograft failure, HTN, DMT2, NICM, GERD, B12 deficiency, and thrombocytopenia.He resumed HD at [facility] 10/30/2022.  Last OP HD 12/15/2023. He rarely comes to HD. He has residual renal function which has enabled him to miss HD MOST of the time with impunity. Today seen in room. He is alert, oriented. Denies SOB. Asterixis present. He gives plausible reason (mother-in-law died) that he hasn't come to HD.   He presented to ED with nausea and vomiting, anemia. HGB 7.7 . SCr 29.07 BUN 190. K+ 5.2 PO4 10.1. CXR unremarkable. Will order urgent HD with BFR/DFR 250/200, 2.5 hours to avoid dialysis disequilibrium syndrome. Give ESA with HD today.   Past Medical History:  Diagnosis Date   Acute renal failure superimposed on stage 5 chronic kidney disease, not on chronic dialysis (HCC) 11/29/2017   Adequate hearing function    Anemia    CKD (chronic kidney disease), stage III (HCC)    Diabetes mellitus 12/2008   A1C 8.3 12/23/08, 24hr urine protein 2793 mg/day, SPEP neg, proliferative BDR s/p photocoagulation 08/2009 done at [facility] and f/u by DR Groat   DKA (diabetic ketoacidosis) (HCC) 12/24/2018   IMO SNOMED Dx Update Oct 2024     ESRD (end stage renal disease) on dialysis (HCC)    1.Initiated HD via right per cate 12/30/2008 (MWF schedule at [facility]) 2.Left upper extremity A-V fistula by Dr Nolene Baumgarten 12/30/2008 3.Aemia: Received Venofer  12/30/08 Ferritin 179 % sat 12 12/23/08, 4. Secondary Hyperparathyroidism- PTH 188, P 5.4, Ca 9.2 12/2008   Has poorly balanced  diet    Hemodialysis-associated hypotension    coreg  d/c'd   Herniated lumbar disc without myelopathy    History of blood transfusion    Hyperlipemia    Hyperparathyroidism due to renal insufficiency (HCC)    Hypertension    resloved after initating HD, now with post HD hypotension   Hypocalcemia    Hypomagnesemia    Kidney transplant as cause of abnormal reaction or later complication 2015   [facility]   Non-ischemic cardiomyopathy (HCC)    EF 20-25% 12/23/2008>>EF 40-45% 03/2009>>EF ??? 7/?/2011   Obese    Occult blood positive stool    Psoriasis    scalp   Thrombocytopenia (HCC)    Vitamin B 12 deficiency    Past Surgical History:  Procedure Laterality Date   AV FISTULA PLACEMENT     left   AV FISTULA PLACEMENT  09/12/2012   Procedure: INSERTION OF ARTERIOVENOUS (AV) GORE-TEX GRAFT ARM;  Surgeon: Richrd Char, MD;  Location: [facility] OR;  Service: Vascular;  Laterality: Left;  KIDNEY TRANSPLANT FAILED   BIOPSY  02/01/2019   Procedure: BIOPSY;  Surgeon: Genell Ken, MD;  Location: [facility] ENDOSCOPY;  Service: Gastroenterology;;   CATARACT EXTRACTION, BILATERAL     COLONOSCOPY WITH PROPOFOL  N/A 02/01/2019   Procedure: COLONOSCOPY WITH PROPOFOL ;  Surgeon: Genell Ken, MD;  Location: [facility] ENDOSCOPY;  Service: Gastroenterology;  Laterality: N/A;   EYE SURGERY     INSERTION OF DIALYSIS CATHETER  07/14/2012   Procedure: INSERTION OF DIALYSIS CATHETER;  Surgeon: Palma Bob, MD;  Location: [facility] OR;  Service: Vascular;  Laterality: Right;  Ultrasound Guided Insertion of Internal Jugular Dialysis Catheter   IR THROMBECTOMY AV FISTULA W/THROMBOLYSIS/PTA INC/SHUNT/IMG LEFT Left 12/28/2018   IR THROMBECTOMY AV FISTULA W/THROMBOLYSIS/PTA INC/SHUNT/IMG LEFT Left 06/10/2023   IR US  GUIDE VASC ACCESS LEFT  12/28/2018   IR US  GUIDE VASC ACCESS LEFT  06/14/2023   KIDNEY TRANSPLANT  2013   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   KIDNEY TRANSPLANT  12/2013   second kidney transplant   lumbar disc sugery    10/17/2017   LUMBAR DISC SURGERY     NEPHRECTOMY TRANSPLANTED ORGAN  08/11/12   PHOTOCOAGULATION     for diabetic retinopathy 08/1999   SHUNTOGRAM N/A 07/11/2012   Procedure: Priscilla Brothers;  Surgeon: Margherita Shell, MD;  Location: [facility] CATH LAB;  Service: Cardiovascular;  Laterality: N/A;   Family History  Problem Relation Age of Onset   Diabetes Father    Kidney disease Father    Other Father        amputation   Hypertension Mother    Dementia Mother    Diabetes Sister    Hypertension Sister    Diabetes Sister    Kidney disease Sister    Diabetes Sister    Hypertension Brother    Diabetes Brother    CAD Neg Hx    Sudden Cardiac Death Neg Hx    Congestive Heart Failure Neg Hx    Colon cancer Neg Hx    Rectal cancer Neg Hx    Esophageal cancer Neg Hx    Liver cancer Neg Hx    Social History:  reports that he has never smoked. He has never used smokeless tobacco. He reports that he does not drink alcohol and does not use drugs. Allergies  Allergen Reactions   Lactose Intolerance (Gi) Diarrhea    No whole milk!!   Other Other (See Comments)    Soy milk burns his tongue   Pollen Extract Other (See Comments)    Itching, watery eyes and sneezing.   Tape Rash    PLEASE USE COBAN WRAP Pt can't have paper tape   Prior to Admission medications   Medication Sig Start Date End Date Taking? Authorizing Provider  acetaminophen  (TYLENOL ) 500 MG tablet Take 500 mg by mouth every 6 (six) hours as needed for mild pain (pain score 1-3).   Yes [provider]  aspirin  EC 81 MG tablet Take 81 mg by mouth daily.   Yes [provider]  calcium  carbonate (OSCAL) 1500 (600 Ca) MG TABS tablet Take 1,500 mg by mouth 2 (two) times daily with a meal.   Yes [provider]  dapsone  25 MG tablet Take 25 mg by mouth daily.   Yes [provider]  metoCLOPramide  (REGLAN ) 5 MG tablet Take 1 tablet (5 mg total) by mouth every 6 (six) hours as needed for nausea or  vomiting. 06/12/23  Yes Lorita Rosa, MD  omeprazole  (PRILOSEC) 20 MG capsule Take 1 capsule (20 mg total) by mouth daily. 12/20/17  Yes Verma Gobble, NP  tacrolimus  (PROGRAF ) 1 MG capsule Take 3 capsules (3 mg total) by mouth 2 (two) times daily. Patient taking differently: Take 1 mg by mouth 2 (two) times daily. 02/05/19  Yes Dahal, Aminta Baldy, MD  Accu-Chek FastClix Lancets MISC 1 each by In Vitro route 4 (four) times daily. DX:E11.9 02/13/19   Verma Gobble, NP  B Complex-C-Folic Acid  (RENA-VITE RX) 1 MG TABS Take 1 tablet by mouth daily. Patient not taking: Reported on 01/25/2024  06/12/21   [provider]  glucose blood (ACCU-CHEK AVIVA PLUS) test strip 1 each by Other route 4 (four) times daily. DX:E11.9 02/13/19   Verma Gobble, NP   Current Facility-Administered Medications  Medication Dose Route Frequency Provider Last Rate Last Admin   acetaminophen  (TYLENOL ) tablet 650 mg  650 mg Oral Q6H PRN Rayma Calandra, DO       Or   acetaminophen  (TYLENOL ) suppository 650 mg  650 mg Rectal Q6H PRN Rayma Calandra, DO       dapsone  tablet 50 mg  50 mg Oral Daily Rayma Calandra, DO       heparin  injection 5,000 Units  5,000 Units Subcutaneous Q8H Rayma Calandra, DO   5,000 Units at 01/26/24 1610   insulin  aspart (novoLOG ) injection 0-6 Units  0-6 Units Subcutaneous TID WC Clem Currier, DO       metoCLOPramide  (REGLAN ) tablet 5 mg  5 mg Oral Q6H PRN Rayma Calandra, DO       pantoprazole  (PROTONIX ) EC tablet 40 mg  40 mg Oral Daily Rayma Calandra, DO       tacrolimus  (PROGRAF ) capsule 1 mg  1 mg Oral BID Clem Currier, DO       Labs: Basic Metabolic Panel: Recent Labs  Lab 01/25/24 1546 01/26/24 0355  NA 138 139  K 5.1 5.2*  CL 106 106  CO2 13* 11*  GLUCOSE 268* 106*  BUN 188* 190*  CREATININE 28.75* 29.07*  CALCIUM  6.5* 6.5*  PHOS  --  10.1*   Liver Function Tests: Recent Labs  Lab 01/25/24 1546 01/26/24 0355  AST 20  --   ALT 15  --   ALKPHOS  69  --   BILITOT 0.6  --   PROT 7.4  --   ALBUMIN 4.0 3.9   Recent Labs  Lab 01/25/24 1546  LIPASE 68*   No results for input(s): AMMONIA in the last 168 hours. CBC: Recent Labs  Lab 01/25/24 1546 01/26/24 0355  WBC 7.5 8.7  NEUTROABS 5.3  --   HGB 7.9* 7.7*  HCT 24.5* 23.7*  MCV 86.9 85.3  PLT 63* 64*   Cardiac Enzymes: No results for input(s): CKTOTAL, CKMB, CKMBINDEX, TROPONINI in the last 168 hours. CBG: Recent Labs  Lab 01/26/24 0604  GLUCAP 102*   Iron Studies: No results for input(s): IRON, TIBC, TRANSFERRIN, FERRITIN in the last 72 hours. Studies/Results: DG Chest 2 View Result Date: 01/25/2024 CLINICAL DATA:  Shortness of breath. EXAM: CHEST - 2 VIEW COMPARISON:  01/30/2019. FINDINGS: Bilateral lung fields are clear. Bilateral costophrenic angles are clear. Normal cardio-mediastinal silhouette. Redemonstration of vascular stent in the left paramedian upper anterior hemithorax. No acute osseous abnormalities. The soft tissues are within normal limits. IMPRESSION: No active cardiopulmonary disease. Electronically Signed   By: Beula Brunswick M.D.   On: 01/25/2024 16:35    ROS: As per HPI otherwise negative.   Physical Exam: Vitals:   01/26/24 0300 01/26/24 0341 01/26/24 0400 01/26/24 0727  BP:  (!) 169/90  (!) 168/80  Pulse: 85 86 81 88  Resp:  18  17  Temp:  97.8 F (36.6 C)  (!) 97.5 F (36.4 C)  TempSrc:  Oral  Oral  SpO2: 100% 100% 100% 100%  Weight:  62.8 kg    Height:         General: Chronically ill appearing male in NAD  Head: Normocephalic, atraumatic, sclera non-icteric Neck: Supple. JVD not elevated. Lungs: CTAB A/P Heart: RRR with S1 S2. No  M/R/G Abdomen: NABS, NT, ND Lower extremities:No LE edema Neuro: Alert and oriented X 3. Asterixis present Psych:  Responds to questions appropriately with a normal affect. Dialysis Access: L AVG + T/B  Dialysis Orders: [facility] T,Th,S 4 hrs 180NRe 400/600 66.9 kg 2.0 K/ 3.0 Ca  AVG - Heparin  2500 units IV three times per week - Hectorol  4 mcg IV three times per week - Mircera 225 mcg IV Q 2 weeks (Last dose 12/13/2023)   Assessment/Plan:  Uremia in setting of missed HD: Will resume HD as if he was new ESRD. Serial HD X 3 days. Asterixis, N & V present but A & O X 3. Failed transplant pt X 2 with residual renal function Hypocalcemia-chronic issue. Will use added calcium  bath and high dose hectorol   as per OP center. He is asymptomatic. C Ca+ 6.6.   ESRD - T,Th,S. Last HD 12/15/2023. Resume RRT as though he is new start.   Hypertension/volume  - BP elevated. Resume home meds. UF as tolerated. Not overtly volume overloaded by exam or CXR.   Anemia  - HGB 7.7. No ESA since 12/13/2023. Max ESA dose today and check iron panel  Metabolic bone disease -  resume BMD meds. Ca+ as noted above. Elevated PO4 but this is a chronic issue for him. Use calcium  acetate binders.   Nutrition - renal diet with fluid restrictions. Albumin at goal.   Zulma Hitt. Bevin Bucks, NP-C 01/26/2024, 8:49 AM  [employer] [phone]

## 2024-01-26 NOTE — Assessment & Plan Note (Addendum)
 HTN: not on any home medications. Monitor with vitals.  T2DM: CBGs ACHS and on very sensitive SSI

## 2024-01-26 NOTE — Progress Notes (Signed)
   01/26/24 1448  Vitals  Temp 97.7 F (36.5 C)  Pulse Rate 92  Resp 11  BP 128/68  SpO2 92 %  O2 Device Room Air  Post Treatment  Dialyzer Clearance Clear  Liters Processed 37.5  Fluid Removed (mL) 600 mL  Tolerated HD Treatment Yes  AVG/AVF Arterial Site Held (minutes) 6 minutes  AVG/AVF Venous Site Held (minutes) 6 minutes   Received patient in bed to unit.  Alert and oriented.  Informed consent signed and in chart.   TX duration: Two hours and thirty minutes.  Patient tolerated well.  Transported back to the room  Alert, without acute distress.  Hand-off given to patient's nurse.   Access used: Left forearm graft Access issues: None

## 2024-01-26 NOTE — Progress Notes (Signed)
     Daily Progress Note Intern Pager: [phone]  Patient name: [name] Medical record number: [id number] Date of birth: Oct 07, 1970 Age: 53 y.o. Gender: male  Primary Care Provider: Patient, No Pcp Per Consultants: Nephrology Code Status: Full Code  Pt Overview and Major Events to Date:  6/11: Admitted  Assessment and Plan: [name] is a 53 year old male with PMH of 2 failed kidney transplants, ESRD on dialysis, T2DM, and HTN who presented with uremia secondary to missing dialysis for 6 weeks.  Patient remains hemodynamically stable, but will require dialysis.  Nephrology has been consulted, appreciate recommendations. Assessment & Plan ESRD (end stage renal disease) (HCC) On HD, but has missed numerous sessions over the last 6 weeks.  - Dialysis per nephrology - Continue home Tacrolimus  1 mg and Dapsone  50 mg - Hold nephrotoxic agents - AM RFP - Reglan  5 mg for nausea prn - PT/OT eval and treat Anemia, chronic disease Likely secondary to chronic disease.  Baseline ~9-10, currently 7.7.  No evidence of hematemesis or hematochezia.  Likely decreased in the setting of worsening renal function. - Monitor CBC - Transfusion threshold < 7 - Consider supplementations per nephrology Chronic health problem HTN: not on any home medications. Monitor with vitals.  T2DM: CBGs ACHS and on very sensitive SSI  FEN/GI: Renal PPx: Heparin  Dispo: Pending clinical improvement.  Subjective:  Patient is doing well this morning and has no concerns. Reports that he missed HD because his mother-in-law died recently and he has been helping clean out her house.   Objective: Temp:  [97.2 F (36.2 C)-98 F (36.7 C)] 98 F (36.7 C) (06/12 1623) Pulse Rate:  [73-109] 92 (06/12 1448) Resp:  [11-18] 15 (06/12 1623) BP: (82-187)/(64-98) 128/68 (06/12 1448) SpO2:  [81 %-100 %] 100 % (06/12 1623) Weight:  [62 kg-62.8 kg] 62 kg (06/12 1448) Physical Exam: General: Awake and Alert in  NAD HEENT: NCAT. Sclera anicteric. No rhinorrhea. Cardiovascular: RRR. No M/R/G Respiratory: CTAB, normal WOB on RA. No wheezing, crackles, rhonchi, or diminished breath sounds. Abdomen: Soft, non-tender, non-distended. Bowel sounds normoactive Extremities: Able to move all extremities. No BLE edema, no deformities or significant joint findings. Skin: Warm and dry. No abrasions or rashes noted. Neuro: A&Ox3. No focal neurological deficits.  Laboratory: Most recent CBC Lab Results  Component Value Date   WBC 8.7 01/26/2024   HGB 7.7 (L) 01/26/2024   HCT 23.7 (L) 01/26/2024   MCV 85.3 01/26/2024   PLT 64 (L) 01/26/2024   Most recent BMP    Latest Ref Rng & Units 01/26/2024    3:55 AM  BMP  Glucose 70 - 99 mg/dL 147   BUN 6 - 20 mg/dL 829   Creatinine 5.62 - 1.24 mg/dL 13.08   Sodium 657 - 846 mmol/L 139   Potassium 3.5 - 5.1 mmol/L 5.2   Chloride 98 - 111 mmol/L 106   CO2 22 - 32 mmol/L 11   Calcium  8.9 - 10.3 mg/dL 6.5     N6E: 7.5 Phos: 10.1 UA: Negative for UTI, moderate hemoglobinuria, glucosuria (150), proteinuria (300) Lipase: 68  Imaging/Diagnostic Tests: CXR: no cardiopulmonary process  Clyda Dark, DO 01/26/2024, 7:10 PM  PGY-1, [facility] FPTS Intern pager: [phone], text pages welcome Secure chat group [facility] Teaching Service

## 2024-01-26 NOTE — Assessment & Plan Note (Addendum)
 On HD, but has missed numerous sessions over the last 6 weeks.  - Dialysis per nephrology - Continue home Tacrolimus  1 mg and Dapsone  50 mg - Hold nephrotoxic agents - AM RFP - Reglan  5 mg for nausea prn - PT/OT eval and treat

## 2024-01-26 NOTE — Plan of Care (Signed)

## 2024-01-26 NOTE — Progress Notes (Signed)
 PT Cancellation Note  Patient Details Name: [name] MRN: [id number] DOB: 1970-10-12   Cancelled Treatment:    Reason Eval/Treat Not Completed: Patient at procedure or test/unavailable  Off the unit. Will continue attempts at evaluation.   Gayle Kava, PT Acute Rehabilitation Services  Office [phone]   [place] Leep 01/26/2024, 11:30 AM

## 2024-01-27 ENCOUNTER — Other Ambulatory Visit (HOSPITAL_COMMUNITY): Payer: Self-pay

## 2024-01-27 ENCOUNTER — Encounter (HOSPITAL_COMMUNITY): Payer: Self-pay

## 2024-01-27 DIAGNOSIS — N186 End stage renal disease: Secondary | ICD-10-CM | POA: Diagnosis not present

## 2024-01-27 LAB — CBC
HCT: 26.8 % — ABNORMAL LOW (ref 39.0–52.0)
Hemoglobin: 8.9 g/dL — ABNORMAL LOW (ref 13.0–17.0)
MCH: 27.9 pg (ref 26.0–34.0)
MCHC: 33.2 g/dL (ref 30.0–36.0)
MCV: 84 fL (ref 80.0–100.0)
Platelets: 73 10*3/uL — ABNORMAL LOW (ref 150–400)
RBC: 3.19 MIL/uL — ABNORMAL LOW (ref 4.22–5.81)
RDW: 15.9 % — ABNORMAL HIGH (ref 11.5–15.5)
WBC: 7.2 10*3/uL (ref 4.0–10.5)
nRBC: 0 % (ref 0.0–0.2)

## 2024-01-27 LAB — RENAL FUNCTION PANEL
Albumin: 4.1 g/dL (ref 3.5–5.0)
Anion gap: 17 — ABNORMAL HIGH (ref 5–15)
BUN: 99 mg/dL — ABNORMAL HIGH (ref 6–20)
CO2: 23 mmol/L (ref 22–32)
Calcium: 7.9 mg/dL — ABNORMAL LOW (ref 8.9–10.3)
Chloride: 96 mmol/L — ABNORMAL LOW (ref 98–111)
Creatinine, Ser: 17.78 mg/dL — ABNORMAL HIGH (ref 0.61–1.24)
GFR, Estimated: 3 mL/min — ABNORMAL LOW (ref >60)
Glucose, Bld: 144 mg/dL — ABNORMAL HIGH (ref 70–99)
Phosphorus: 6.7 mg/dL — ABNORMAL HIGH (ref 2.5–4.6)
Potassium: 3.5 mmol/L (ref 3.5–5.1)
Sodium: 136 mmol/L (ref 135–145)

## 2024-01-27 LAB — GLUCOSE, CAPILLARY
Glucose-Capillary: 185 mg/dL — ABNORMAL HIGH (ref 70–99)
Glucose-Capillary: 204 mg/dL — ABNORMAL HIGH (ref 70–99)
Glucose-Capillary: 140 mg/dL — ABNORMAL HIGH (ref 70–99)

## 2024-01-27 MED ORDER — CHLORHEXIDINE GLUCONATE CLOTH 2 % EX PADS: 6.0000 | MEDICATED_PAD | Freq: Every day | CUTANEOUS | Status: DC

## 2024-01-27 MED ORDER — AMLODIPINE BESYLATE 10 MG PO TABS
10.0000 mg | ORAL_TABLET | Freq: Every day | ORAL | 0 refills | Status: AC
Start: 2024-01-27 — End: ?
  Filled 2024-01-27: qty 30, 30d supply, fill #0

## 2024-01-27 MED ORDER — METOCLOPRAMIDE HCL 5 MG/ML IJ SOLN
5.0000 mg | Freq: Four times a day (QID) | INTRAMUSCULAR | Status: DC | PRN
  Administered 2024-01-27: 5 mg via INTRAVENOUS
  Filled 2024-01-27: qty 2

## 2024-01-27 NOTE — Evaluation (Signed)
 Occupational Therapy Evaluation and Discharge Patient Details Name: [name] MRN: [id number] DOB: 1970/12/16 Today's Date: 01/27/2024   History of Present Illness   Pt is a 53 y.o. male presented to [facility] 6/11 after missing dialysis for last 6 weeks. Hx of failed kidney transplants. PMH: CKD, DM, ESRD, HLD, HTN, hypocalcemia, hypomagnesemia, psoriasis     Clinical Impressions Pt is functioning modified independently to independently in ADLs and mobility. No further OT or DME needs.      If plan is discharge home, recommend the following:   Assist for transportation     Functional Status Assessment   Patient has not had a recent decline in their functional status     Equipment Recommendations   None recommended by OT     Recommendations for Other Services         Precautions/Restrictions   Precautions Precautions: Fall Restrictions Weight Bearing Restrictions Per Provider Order: No     Mobility Bed Mobility Overal bed mobility: Independent                  Transfers Overall transfer level: Independent Equipment used: None                      Balance Overall balance assessment: Mild deficits observed, not formally tested                                         ADL either performed or assessed with clinical judgement   ADL Overall ADL's : Modified independent                                             Vision Ability to See in Adequate Light: 0 Adequate Patient Visual Report: No change from baseline Additional Comments: cloudy lens on R     Perception         Praxis         Pertinent Vitals/Pain Pain Assessment Pain Assessment: No/denies pain     Extremity/Trunk Assessment Upper Extremity Assessment Upper Extremity Assessment: Right hand dominant;RUE deficits/detail;LUE deficits/detail RUE Deficits / Details: longstanding tremor LUE Deficits / Details: longstanding tremor    Lower Extremity Assessment Lower Extremity Assessment: Defer to PT evaluation   Cervical / Trunk Assessment Cervical / Trunk Assessment: Normal   Communication Communication Communication: No apparent difficulties   Cognition Arousal: Alert Behavior During Therapy: WFL for tasks assessed/performed Cognition: No apparent impairments                               Following commands: Intact       Cueing  General Comments   Cueing Techniques: Verbal cues  Messaged case manager about pt needing a note to return to work and needing assistance with transportation   Exercises     Shoulder Instructions      Home Living Family/patient expects to be discharged to:: Private residence Living Arrangements: Spouse/significant other Available Help at Discharge: Family;Available 24 hours/day Type of Home: Apartment Home Access: Level entry     Home Layout: One level     Bathroom Shower/Tub: Producer, television/film/video: Standard     Home Equipment: Cane - single point  Prior Functioning/Environment Prior Level of Function : Independent/Modified Independent;Working/employed;Driving             Mobility Comments: Ind with SP cane ADLs Comments: works part time housekeeper    OT Problem List:     OT Treatment/Interventions:        OT Goals(Current goals can be found in the care plan section)       OT Frequency:       Co-evaluation   Reason for Co-Treatment: For patient/therapist safety PT goals addressed during session: Mobility/safety with mobility;Balance        AM-PAC OT 6 Clicks Daily Activity     Outcome Measure Help from another person eating meals?: None Help from another person taking care of personal grooming?: None Help from another person toileting, which includes using toliet, bedpan, or urinal?: None Help from another person bathing (including washing, rinsing, drying)?: None Help from another person to put  on and taking off regular upper body clothing?: None Help from another person to put on and taking off regular lower body clothing?: None 6 Click Score: 24   End of Session    Activity Tolerance: Patient tolerated treatment well Patient left: in bed;with call bell/phone within reach  OT Visit Diagnosis: Other abnormalities of gait and mobility (R26.89)                Time: 6578-4696 OT Time Calculation (min): 20 min Charges:  OT General Charges $OT Visit: 1 Visit OT Evaluation $OT Eval Low Complexity: 1 Low  Avanell Leigh, OTR/L Acute Rehabilitation Services Office: [phone]   Jonette Nestle 01/27/2024, 1:15 PM

## 2024-01-27 NOTE — Progress Notes (Signed)
 Pt signed AMA paperwork and called wife to pick him up. Telemetry off and IV removed. Pt dressed self and took his belongings. Pt given work note and directed to main entrance. Benna Brasher, RN

## 2024-01-27 NOTE — Progress Notes (Signed)
 Report given to [facility]

## 2024-01-27 NOTE — Progress Notes (Signed)
 CSW added transportation resources to patients AVS.

## 2024-01-27 NOTE — TOC Transition Note (Addendum)
 Transition of Care [facility]) - Discharge Note Sherin Dingwall RN, BSN Transitions of Care Unit 4E- RN Case Manager See Treatment Team for direct phone #   Patient Details  Name: [name] MRN: [id number] Date of Birth: 1971/05/14  Transition of Care [facility]) CM/SW Contact:  Rox Cope, RN Phone Number: 01/27/2024, 3:18 PM   Clinical Narrative:    Plan for HD here tomorrow first shift then d/c after. Pt has been set up with outpt HD chair spot for TTS- and is aware that he will start next Tues- 6/17 per renal navigator.  CSW has provided info for transportation needs.  MD to provide work note on discharge.   CM has followed up on PCP needs- appointment made and info placed on AVS.   No HH or DME needs noted.    Barriers to Discharge: Barriers Resolved   Patient Goals and CMS Choice Patient states their goals for this hospitalization and ongoing recovery are:: return home   Choice offered to / list presented to : NA      Discharge Placement                 Home      Discharge Plan and Services Additional resources added to the After Visit Summary for   In-house Referral: Clinical Social Work Discharge Planning Services: CM Consult, Follow-up appt scheduled Post Acute Care Choice: NA          DME Arranged: N/A DME Agency: NA       HH Arranged: NA HH Agency: NA        Social Drivers of Health (SDOH) Interventions SDOH Screenings   Food Insecurity: No Food Insecurity (01/26/2024)  Housing: Low Risk  (01/26/2024)  Transportation Needs: No Transportation Needs (01/26/2024)  Utilities: Not At Risk (01/26/2024)  Depression (PHQ2-9): Low Risk  (04/01/2020)  Financial Resource Strain: Medium Risk (01/27/2018)  Physical Activity: Insufficiently Active (01/27/2018)  Social Connections: Moderately Integrated (01/27/2018)  Stress: No Stress Concern Present (01/27/2018)  Tobacco Use: Low Risk  (01/25/2024)     Readmission Risk Interventions     No data to  display

## 2024-01-27 NOTE — Progress Notes (Signed)
     Daily Progress Note Intern Pager: [phone]  Patient name: [name] Medical record number: [id number] Date of birth: 05/20/1971 Age: 53 y.o. 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  Patient remains hemodynamically stable and received dialysis on 6/12.  Nephrology on board, appreciate recommendations. Assessment & Plan ESRD (end stage renal disease) (HCC) HD Tu/Th/Sat.  HD session yesterday went well.  - Return to normal HD schedule - Continue home Tacrolimus  1 mg and Dapsone  50 mg - Hold nephrotoxic agents - AM RFP - IV Reglan  5 mg for nausea prn - PT/OT eval and treat Anemia, chronic disease Likely secondary to chronic disease.  Baseline ~9-10, 7.7 on admission, improved to 8.9 this morning.  No evidence of hematemesis or hematochezia.  Likely decreased in the setting of worsening renal function. - Monitor CBC - Transfusion threshold < 7 - Consider supplementations per nephrology Chronic health problem HTN: Not on any home medications. Monitor with vitals.  T2DM: CBGs ACHS and on very sensitive SSI, relatively stable  FEN/GI: Renal Diet PPx: Heparin  Dispo:Home today. Barriers include PCP needs.   Subjective:  Patient is doing well this morning and has no concerns.  No nausea this morning.  Objective: Temp:  [97.7 F (36.5 C)-98.4 F (36.9 C)] 98.4 F (36.9 C) (06/13 1133) Pulse Rate:  [92-107] 102 (06/13 1133) Resp:  [11-20] 20 (06/13 1133) BP: (82-157)/(64-89) 136/85 (06/13 1133) SpO2:  [92 %-100 %] 100 % (06/13 1133) Weight:  [62 kg-67.2 kg] 67.2 kg (06/13 0321) Physical Exam: General: Awake and Alert in NAD HEENT: NCAT.  Sclera anicteric. No rhinorrhea. Cardiovascular: RRR. No M/R/G Respiratory: CTAB, normal WOB on RA. No wheezing, crackles, rhonchi, or diminished breath sounds. Abdomen: Soft, non-tender, non-distended. Bowel sounds normoactive Extremities: Able to move all extremities. No BLE edema, no deformities or significant joint findings. Skin: Warm and dry. No abrasions or rashes noted. Neuro: A&Ox3. No focal neurological deficits.  Laboratory: Most recent CBC Lab Results  Component Value Date   WBC 7.2 01/27/2024   HGB 8.9 (L) 01/27/2024   HCT 26.8 (L) 01/27/2024   MCV 84.0 01/27/2024   PLT 73 (L) 01/27/2024   Most recent BMP    Latest Ref Rng & Units 01/27/2024    3:44 AM  BMP  Glucose 70 - 99 mg/dL 147   BUN 6 - 20 mg/dL 99   Creatinine 8.29 - 1.24 mg/dL 56.21   Sodium 308 - 657 mmol/L 136   Potassium 3.5 - 5.1 mmol/L 3.5   Chloride 98 - 111 mmol/L 96   CO2 22 - 32 mmol/L 23   Calcium  8.9 - 10.3 mg/dL 7.9     Imaging/Diagnostic Tests: No new imaging.  Clyda Dark, DO 01/27/2024, 12:38 PM  PGY-1, [facility] Family Medicine FPTS Intern pager: [phone], text pages welcome Secure chat group [facility] Teaching Service

## 2024-01-27 NOTE — Evaluation (Signed)
 Physical Therapy Evaluation Patient Details Name: [name] MRN: [id number] DOB: 09-Jun-1971 Today's Date: 01/27/2024  History of Present Illness  Pt is a 53 y.o. male [facility]11 after missing dialysis for last 6 weeks. Hx of failed kidney transplants. PMH: CKD, DM, ESRD, HLD, HTN, hypocalcemia, hypomagnesemia, psoriasis  Clinical Impression  Pt in bed upon arrival and agreeable to PT eval. PTA, pt was ModI with SP cane. In today's session, pt was independent for bed mobility and to stand with no AD. Able to ambulate with no AD, however, preferred to use handrails in the hallway for support to mimic cane. Able to ambulate 160 ft with supervision for safety. Pt reports being at functional mobility baseline. Pt feels comfortable discharging home when medically stable and has no further questions or concerns. Pt has 24/7 supervision level of assist available at home. Pt with no further acute or post acute PT needs. Acute PT signing off.   SpO2 >98% on RA        If plan is discharge home, recommend the following: Assist for transportation   Can travel by private vehicle    Yes    Equipment Recommendations None recommended by PT      Functional Status Assessment Patient has had a recent decline in their functional status and demonstrates the ability to make significant improvements in function in a reasonable and predictable amount of time.     Precautions / Restrictions Precautions Precautions: Fall Restrictions Weight Bearing Restrictions Per Provider Order: No      Mobility  Bed Mobility Overal bed mobility: Independent   Transfers Overall transfer level: Independent   Ambulation/Gait Ambulation/Gait assistance: Supervision Gait Distance (Feet): 160 Feet Assistive device: None (hand rail) Gait Pattern/deviations: Step-through pattern, Decreased stride length, Wide base of support Gait velocity: decr     General Gait Details: wide BOS with increased  medial/lateral sway. Able to ambulate with no UE support, however, would reach for rail in hallway    Balance Overall balance assessment: Mild deficits observed, not formally tested       Pertinent Vitals/Pain Pain Assessment Pain Assessment: No/denies pain    Home Living Family/patient expects to be discharged to:: Private residence Living Arrangements: Spouse/significant other (wife) Available Help at Discharge: Family;Available 24 hours/day Type of Home: Apartment Home Access: Level entry       Home Layout: One level Home Equipment: Cane - single point      Prior Function Prior Level of Function : Independent/Modified Independent;Working/employed;Driving  Mobility Comments: Ind with SP cane 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 Extremity Assessment Upper Extremity Assessment: Defer to OT evaluation    Lower Extremity Assessment Lower Extremity Assessment: Overall WFL for tasks assessed    Cervical / Trunk Assessment Cervical / Trunk Assessment: Normal  Communication   Communication Communication: No apparent difficulties    Cognition Arousal: Alert Behavior During Therapy: WFL for tasks assessed/performed   PT - Cognitive impairments: No apparent impairments  Following commands: Intact       Cueing Cueing Techniques: Verbal cues     General Comments General comments (skin integrity, edema, etc.): Messaged case manager about pt needing a note to return to work and needing assistance with transportation     PT Assessment Patient does not need any further PT services                Co-evaluation   Reason for Co-Treatment: For patient/therapist safety PT goals  addressed during session: Mobility/safety with mobility;Balance         AM-PAC PT 6 Clicks Mobility  Outcome Measure Help needed turning from your back to your side while in a flat bed without using bedrails?: None Help needed moving from  lying on your back to sitting on the side of a flat bed without using bedrails?: None Help needed moving to and from a bed to a chair (including a wheelchair)?: A Little Help needed standing up from a chair using your arms (e.g., wheelchair or bedside chair)?: None Help needed to walk in hospital room?: A Little Help needed climbing 3-5 steps with a railing? : A Little 6 Click Score: 21    End of Session   Activity Tolerance: Patient tolerated treatment well Patient left: in bed;with call bell/phone within reach (seated on EOB) Nurse Communication: Mobility status PT Visit Diagnosis: Other abnormalities of gait and mobility (R26.89)    Time: 1610-9604 PT Time Calculation (min) (ACUTE ONLY): 19 min   Charges:   PT Evaluation $PT Eval Low Complexity: 1 Low   PT General Charges $$ ACUTE PT VISIT: 1 Visit    Orysia Blas, PT, DPT Secure Chat Preferred  Rehab Office [phone]   Alissa April Adela Ades 01/27/2024, 11:25 AM

## 2024-01-27 NOTE — Progress Notes (Signed)
 [facility] Progress Note   Subjective:   feels good, HD went fine yesterday  Objective Vitals:   01/26/24 1932 01/26/24 2320 01/27/24 0321 01/27/24 0817  BP: (!) 155/89 (!) 157/74 (!) 142/78 (!) 141/80  Pulse: (!) 103 99 (!) 107 98  Resp: 18 18 18 20   Temp: 98.4 F (36.9 C) 98.4 F (36.9 C) 98.3 F (36.8 C) 98.4 F (36.9 C)  TempSrc: Oral Oral Oral Oral  SpO2: 95% 96% 96% 99%  Weight:   67.2 kg   Height:       Physical Exam General: lying in bed comfortable Heart:RRR Lungs:clear Abdomen:soft Extremities:no edema Dialysis Access: R forearm AVG +t/b  Additional Objective Labs: Basic Metabolic Panel: Recent Labs  Lab 01/25/24 1546 01/26/24 0355 01/27/24 0344  NA 138 139 136  K 5.1 5.2* 3.5  CL 106 106 96*  CO2 13* 11* 23  GLUCOSE 268* 106* 144*  BUN 188* 190* 99*  CREATININE 28.75* 29.07* 17.78*  CALCIUM  6.5* 6.5* 7.9*  PHOS  --  10.1* 6.7*   Liver Function Tests: Recent Labs  Lab 01/25/24 1546 01/26/24 0355 01/27/24 0344  AST 20  --   --   ALT 15  --   --   ALKPHOS 69  --   --   BILITOT 0.6  --   --   PROT 7.4  --   --   ALBUMIN 4.0 3.9 4.1   Recent Labs  Lab 01/25/24 1546  LIPASE 68*   CBC: Recent Labs  Lab 01/25/24 1546 01/26/24 0355 01/27/24 0344  WBC 7.5 8.7 7.2  NEUTROABS 5.3  --   --   HGB 7.9* 7.7* 8.9*  HCT 24.5* 23.7* 26.8*  MCV 86.9 85.3 84.0  PLT 63* 64* 73*   Blood Culture    Component Value Date/Time   SDES BLOOD RIGHT ANTECUBITAL 01/29/2019 1855   SPECREQUEST  01/29/2019 1855    BOTTLES DRAWN AEROBIC ONLY Blood Culture adequate volume   CULT  01/29/2019 1855    NO GROWTH 5 DAYS Performed at [facility], [address] [place], [place] [address]    REPTSTATUS 02/03/2019 FINAL 01/29/2019 1855    Cardiac Enzymes: No results for input(s): CKTOTAL, CKMB, CKMBINDEX, TROPONINI in the last 168 hours. CBG: Recent Labs  Lab 01/26/24 0604 01/26/24 1623 01/26/24 2110 01/27/24 0436  01/27/24 0612  GLUCAP 102* 136* 107* 185* 204*   Iron Studies:  Recent Labs    01/26/24 1046  IRON 167  TIBC 195*  FERRITIN 747*   @lablastinr3 @ Studies/Results: DG Chest 2 View Result Date: 01/25/2024 CLINICAL DATA:  Shortness of breath. EXAM: CHEST - 2 VIEW COMPARISON:  01/30/2019. FINDINGS: Bilateral lung fields are clear. Bilateral costophrenic angles are clear. Normal cardio-mediastinal silhouette. Redemonstration of vascular stent in the left paramedian upper anterior hemithorax. No acute osseous abnormalities. The soft tissues are within normal limits. IMPRESSION: No active cardiopulmonary disease. Electronically Signed   By: Beula Brunswick M.D.   On: 01/25/2024 16:35   Medications:   amLODipine   10 mg Oral QHS   calcium  acetate  2,001 mg Oral TID WC   dapsone   50 mg Oral Daily   darbepoetin (ARANESP ) injection - DIALYSIS  200 mcg Subcutaneous Q Thu-1800   doxercalciferol   5 mcg Intravenous Q T,Th,Sa-HD   heparin   5,000 Units Subcutaneous Q8H   insulin  aspart  0-6 Units Subcutaneous TID WC   pantoprazole   40 mg Oral Daily   tacrolimus   1 mg Oral BID  Dialysis Orders: [facility] T,Th,S 4 hrs 180NRe 400/600 66.9 kg 2.0 K/ 3.0 Ca AVG - Heparin  2500 units IV three times per week - Hectorol  4 mcg IV three times per week - Mircera 225 mcg IV Q 2 weeks (Last dose 12/13/2023)     Assessment/Plan:  Uremia in setting of missed HD: Resumed HD 6/12 and tolerated well.  Labs good and he feels fine today - [facility], 3[facility] tx.  Hypocalcemia-chronic issue. Will use added calcium  bath and high dose hectorol   as per OP center. He is asymptomatic.  ESRD - T,Th,S. Last HD 12/15/2023. Resume RRT as though he is new start.   Hypertension/volume  - BP improved w home meds. Set outpt initial EDW 63k based on yesterday's post wt of 62kg in hosp gown.   Anemia  - HGB 7.7 > 8.9. Had ESA here 6/12. Iron replete.   Metabolic bone disease -  resume BMD meds. Ca+ as noted above.  Elevated PO4 but this is a chronic issue for him. Use calcium  acetate binders.   Nutrition - renal diet with fluid restrictions. Albumin at goal.   Plan d/c today with next HD outpt tomorrow.  Pt says fully committed to regular HD attendance now.   Adrian Alba MD 01/27/2024, 11:12 AM  [facility] Pager: [phone]

## 2024-01-27 NOTE — Plan of Care (Addendum)
 FMTS Interim Progress Note  Received message from RN that patient wishing to go home today  Discussed at bedside. Pt expresses frustration with communication from his care team today. States he was told by two doctors that he would be able to leave today and had already called his wife to pick him up. He did say he just received a call to inform him about the outpatient HD session he is unable to make tomorrow but states  he was unaware that this means he needs to stay overnight tonight.   We discussed the risks of not undergoing dialysis tomorrow 6/14 prior to discharge including the risk of significant electrolyte imbalances, cardiac or respiratory arrest, and death. Pt expresses understanding of these risks and elects to leave today against medical advice.   Patient states he does still plan on attending dialysis on Tuesday.  Edison Gore, MD 01/27/2024, 4:33 PM PGY-2, [facility] Family Medicine Service pager [phone]

## 2024-01-27 NOTE — Discharge Summary (Addendum)
 Family Medicine Teaching [facility] AMA Summary  Patient name: [name] Medical record number: [id number] Date of birth: Oct 01, 1970 Age: 53 y.o. Gender: male Date of Admission: 01/25/2024  Date of AMA: 01/27/24  Admitting Physician: Rayma Calandra, DO  Primary Care Provider: Patient, No Pcp Per Consultants: Nephrology  Indication for Hospitalization: Uremia requiring HD  Diagnoses/Problem List:  Principal Problem for Admission: ESRD on HD Other Problems addressed during stay:  Principal Problem:   ESRD (end stage renal disease) (HCC) Active Problems:   Anemia, chronic disease   Chronic health problem  Brief Hospital Course:  [name] is a 53 y.o. male who was admitted to the Family Medicine Teaching Service at [facility] for uremia secondary to multiple missed sessions of dialysis. Hospital course is outlined below by problem.   ESRD requiring dialysis Patient presented to the ED after being told by his dialysis center he would need to be seen at the hospital prior to reestablishing care.  On admission patient's BUN was 188 and his creatinine was 28.  Nephrology was consulted to initiate dialysis while in the hospital.  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  He does still have residual kidney function and makes about 3 cups of urine per day.  His electrolytes and kidney markers improved with dialysis.  At time of discharge nephrology team had coordinated ongoing outpatient dialysis.   However, on 01/27/24 the next available outpatient dialysis appointment that the patient can make it to is on Tuesday 6/17. The patient was advised to remain in the hospital on 6/13 to get HD on 6/14 before discharge; however, he declined and stated he would start outpatient on 6/17 instead. Patient left AGAINST MEDICAL ADVICE.  Anemia of chronic disease On admission patient had a  hemoglobin of 7.9, based on prior labs his baseline appeared to be between 9 - 10.  No evidence of hematemesis or hematochezia on admission.  Patient was monitored and did not require transfusion while admitted.  Other conditions that were chronic and stable: HTN, T2DM  Issues for follow up: Ensure HD sessions as scheduled. Monitor BP and titrate meds as needed. Started amlodipine  10 mg inpatient.  Nephro transplant team to taper immunosuppressive therapy.     Disposition: Home  Condition: Stable  Discharge Exam:  Vitals:   01/27/24 1133 01/27/24 1450  BP: 136/85 (!) 150/81  Pulse: (!) 102 89  Resp: 20 17  Temp: 98.4 F (36.9 C) 98.3 F (36.8 C)  SpO2: 100% 98%   Per Dr. Randeen Busman 01/27/24 General: Awake and Alert in NAD HEENT: NCAT. Sclera anicteric. No rhinorrhea. Cardiovascular: RRR. No M/R/G Respiratory: CTAB, normal WOB on RA. No wheezing, crackles, rhonchi, or diminished breath sounds. Abdomen: Soft, non-tender, non-distended. Bowel sounds normoactive Extremities: Able to move all extremities. No BLE edema, no deformities or significant joint findings. Skin: Warm and dry. No abrasions or rashes noted. Neuro: A&Ox3. No focal neurological deficits.  Significant Procedures: none  Significant Labs and Imaging:  Recent Labs  Lab 01/26/24 0355 01/27/24 0344  WBC 8.7 7.2  HGB 7.7* 8.9*  HCT 23.7* 26.8*  PLT 64* 73*   Recent Labs  Lab 01/26/24 0355 01/27/24 0344  NA 139 136  K 5.2* 3.5  CL 106 96*  CO2 11* 23  GLUCOSE 106* 144*  BUN 190* 99*  CREATININE 29.07* 17.78*  CALCIUM  6.5* 7.9*  PHOS 10.1* 6.7*  ALBUMIN 3.9 4.1  CXR: No active cardiopulmonary disease.  Results/Tests Pending: none  Medications:  Allergies as of 01/27/2024       Reactions   Lactose Intolerance (gi) Diarrhea   No whole milk!!   Other Other (See Comments)   Soy milk burns his tongue   Pollen Extract Other (See Comments)   Itching, watery eyes and sneezing.   Tape Rash    PLEASE USE COBAN WRAP Pt can't have paper tape        Medication List     STOP taking these medications    aspirin  EC 81 MG tablet   Rena-Vite Rx 1 MG Tabs       TAKE these medications    Accu-Chek Aviva Plus test strip Generic drug: glucose blood 1 each by Other route 4 (four) times daily. DX:E11.9   Accu-Chek FastClix Lancets Misc 1 each by In Vitro route 4 (four) times daily. DX:E11.9   acetaminophen  500 MG tablet Commonly known as: TYLENOL  Take 500 mg by mouth every 6 (six) hours as needed for mild pain (pain score 1-3).   amLODipine  10 MG tablet Commonly known as: NORVASC  Take 1 tablet (10 mg total) by mouth at bedtime.   calcium  carbonate 1500 (600 Ca) MG Tabs tablet Commonly known as: OSCAL Take 1,500 mg by mouth 2 (two) times daily with a meal.   dapsone  25 MG tablet Take 25 mg by mouth daily.   metoCLOPramide  5 MG tablet Commonly known as: REGLAN  Take 1 tablet (5 mg total) by mouth every 6 (six) hours as needed for nausea or vomiting.   omeprazole  20 MG capsule Commonly known as: PRILOSEC Take 1 capsule (20 mg total) by mouth daily.   tacrolimus  1 MG capsule Commonly known as: PROGRAF  Take 3 capsules (3 mg total) by mouth 2 (two) times daily. What changed: how much to take        Discharge Instructions: Please refer to Patient Instructions section of EMR for full details.  Patient was counseled important signs and symptoms that should prompt return to medical care, changes in medications, dietary instructions, activity restrictions, and follow up appointments.   Follow-Up Appointments:  Follow-up Information     Caudle, Arcola Kocher, FNP Follow up.   Specialty: Family Medicine Why: TIME :  1:10 PM   PLEASE  ARRIVE AT 12:45 PM DATE : Apr 12, 2024  PLEASE BRING ALL MEDICATION, ID and INS CARD Contact information: [address] [place] [address] [phone]         Center, [place]. Go on  01/31/2024.   Why: Schedule is Tuesday, Thursday, Saturday.  Please arrive at 11:45 am for 12:00 pm chair time. Contact information: [address] [place] [address] [phone]                 Edison Gore, MD 01/27/2024, 4:32 PM PGY-2, [facility] Family Medicine

## 2024-01-27 NOTE — Discharge Instructions (Addendum)
 Dear [name],  Thank you for letting us  participate in your care. You were hospitalized for uremia and diagnosed with ESRD (end stage renal disease) (HCC) requiring hemodialysis.  POST-HOSPITAL & CARE INSTRUCTIONS Please go to your regularly scheduled HD sessions on Tuesday/Thursdays/Saturdays. Go to your follow up appointments (listed below)   DOCTOR'S APPOINTMENT   No future appointments.   Take care and be well!  Family Medicine Teaching Service Inpatient Team [place]  [facility]  [address] [place], [place] [address] [phone]   Transportation Resources: Managed Medicare Transportation:  If you have at managed Medicare plan (UHC/BCBS/Humana etc) please call Member Services on the back of your insurance card to assess potential non-emergency ride benefits.   Medicaid Transportation:  Based on county of residence. If you have Medicaid, you can schedule transportation to medical appointments using the following numbers based off which insurance manages your Medicaid plan: UnitedHealthcare- [phone] [facility]- [phone] [employer]- [phone] [facility]- [phone] [facility]- [phone] If you have secondary or non-managed Medicaid (Medicaid of [place]) call your local Dept of Social Services to discuss transport  Access [place] (formerly [place]): [employer] #: [phone] Eligibility assessment and application required. For those with disabilities- wheelchair accessible, cash fare $4 for round trip  Senior Wheels: [employer], [place], and [employer] #: [id number] Phone #: Everywhere else in [place]- [phone] Provides rides to medical appointments in [facility], based on availability of drivers. Must be 55 and over and unable to drive, limit to one ride per week  Access [place]: [employer] Phone #: [phone] Application required.  Provides rides for those 70 and over as well as disabled individuals in [facility] Operate M-F 5:45am-6:30pm, Saturday 8:45am-5:15pm, costs $2.50 per one-way trip     [facility] and [employer] (TAMS): [facility] Phone# [phone]  Application required. Is available for those under 60 and without Medicaid but may include a charge  [employer]- Discount Identification Card: [place] Phone #: [phone]  Half-price fares if riders meet one or more of the following: are a veteran, senior citizen (60 years or older), are a Consulting civil engineer (6-18 years), receive Medicaid/Medicare, or have a disability. Documentation of either of these is required to a receive card, which must be shown to a driver at boarding each time.   [employer] Phone #: [phone]  Anyone requiring transportation in [place] is eligible to ride the [employer]. [employer] provides transportation for general purpose trips, medical trips, and almost any non-emergency trip destination. In addition, special programs and pricing are available to qualified riders based on eligibility requirements.  [employer]: [facility] Phone #: [phone]  Serves those who are disabled without Medicaid but may have small fee  RCATS: [facility] Phone #: [phone]  Serves those who are disabled without Medicaid but may have small fee. Will provide transportation to [place] during limited times and for $10 round trip.

## 2024-01-27 NOTE — Discharge Planning (Signed)
 [facility] Patient Discharge Orders- [facility] CLINIC: [facility]  Patient's name: [name] Admit/DC Dates: 01/25/2024 - 01/27/2024  Discharge Diagnoses: Uremia from missed HD  LEFT AMA  Aranesp : Given: YES   Date and amount of last dose: 200 MCG IV 01/26/2024   PRBC's Given: No Date/# of units: NA Last HGB: 8.9 ESA dose for discharge: mircera 200 mcg IV q 2 weeks  IV Iron dose at discharge: Per protocol  Heparin  change: No  EDW Change: Yes New EDW: 65 kgs  Bath Change: No  Access intervention/Change: No Details:  Hectorol /Calcitriol  change: no  Discharge Labs: Calcium  7.9 Phosphorus 6.7 Albumin 4.1 K+ 3.5  IV Antibiotics: No Details:  On Coumadin?: No Last INR: Next INR: Managed By:   OTHER/APPTS/LAB ORDERS: Monthly labs next times he attends HD    D/C Meds to be reconciled by nurse after every discharge.  Completed By: Jacobo Masters [facility] [phone]    Reviewed by: MD:______ RN_______

## 2024-01-27 NOTE — Progress Notes (Signed)
 Contacted by attending regarding pt needing to resume at out-pt HD clinic tomorrow. Contacted [facility] and advised that pt can treat tomorrow. Pt would need to arrive at 6:45 am for 7:00 am chair time. Starting next Tuesday, pt's regular schedule will be TTS 11:45 am arrival for 12:00 pm chair time. Spoke to pt via phone to provide above appt info. Pt states he cannot make it to clinic appt tomorrow due to no transportation. Pt states he feels he will be able to make transportation arrangements for Tuesday. Pt agreeable to TTS 12:00 chair time starting Tuesday. HD info added to AVS. Update provided to team via chat. Pt will require inpt HD tomorrow for above reason. Will assist as needed.   Lauraine Polite Renal Navigator [phone]

## 2024-01-27 NOTE — Assessment & Plan Note (Addendum)
 HD Tu/Th/Sat.  HD session yesterday went well.  - Return to normal HD schedule - Continue home Tacrolimus  1 mg and Dapsone  50 mg - Hold nephrotoxic agents - AM RFP - IV Reglan  5 mg for nausea prn - PT/OT eval and treat

## 2024-01-27 NOTE — Assessment & Plan Note (Signed)
 Likely secondary to chronic disease.  Baseline ~9-10, 7.7 on admission, improved to 8.9 this morning.  No evidence of hematemesis or hematochezia.  Likely decreased in the setting of worsening renal function. - Monitor CBC - Transfusion threshold < 7 - Consider supplementations per nephrology

## 2024-01-27 NOTE — Assessment & Plan Note (Signed)
 HTN: Not on any home medications. Monitor with vitals.  T2DM: CBGs ACHS and on very sensitive SSI, relatively stable

## 2024-01-28 ENCOUNTER — Other Ambulatory Visit (HOSPITAL_COMMUNITY): Payer: Self-pay

## 2024-01-28 LAB — HEPATITIS B SURFACE ANTIBODY, QUANTITATIVE: Hep B S AB Quant (Post): 13.6 m[IU]/mL

## 2024-01-30 NOTE — Progress Notes (Signed)
 Late Note entry- January 30, 2024  Pt left hospital AMA on Friday. Contacted [facility] this morning to be advised of this information and that pt should resume tomorrow.   Lauraine Polite Renal Navigator [phone]

## 2024-03-12 ENCOUNTER — Observation Stay (HOSPITAL_COMMUNITY)
Admission: EM | Admit: 2024-03-12 | Discharge: 2024-03-15 | Disposition: A | Discharge status: Home / Self Care | Attending: Internal Medicine | Admitting: Internal Medicine

## 2024-03-12 ENCOUNTER — Encounter (HOSPITAL_COMMUNITY): Payer: Self-pay

## 2024-03-12 ENCOUNTER — Other Ambulatory Visit: Payer: Self-pay

## 2024-03-12 ENCOUNTER — Emergency Department (HOSPITAL_COMMUNITY)

## 2024-03-12 DIAGNOSIS — R7989 Other specified abnormal findings of blood chemistry: Secondary | ICD-10-CM | POA: Diagnosis not present

## 2024-03-12 DIAGNOSIS — E1122 Type 2 diabetes mellitus with diabetic chronic kidney disease: Secondary | ICD-10-CM | POA: Insufficient documentation

## 2024-03-12 DIAGNOSIS — Z8679 Personal history of other diseases of the circulatory system: Secondary | ICD-10-CM | POA: Diagnosis not present

## 2024-03-12 DIAGNOSIS — I4581 Long QT syndrome: Secondary | ICD-10-CM | POA: Diagnosis not present

## 2024-03-12 DIAGNOSIS — E119 Type 2 diabetes mellitus without complications: Secondary | ICD-10-CM | POA: Diagnosis not present

## 2024-03-12 DIAGNOSIS — N186 End stage renal disease: Secondary | ICD-10-CM | POA: Diagnosis not present

## 2024-03-12 DIAGNOSIS — D631 Anemia in chronic kidney disease: Secondary | ICD-10-CM | POA: Diagnosis not present

## 2024-03-12 DIAGNOSIS — R0602 Shortness of breath: Secondary | ICD-10-CM | POA: Diagnosis not present

## 2024-03-12 DIAGNOSIS — I120 Hypertensive chronic kidney disease with stage 5 chronic kidney disease or end stage renal disease: Secondary | ICD-10-CM | POA: Diagnosis not present

## 2024-03-12 DIAGNOSIS — D696 Thrombocytopenia, unspecified: Secondary | ICD-10-CM | POA: Diagnosis present

## 2024-03-12 DIAGNOSIS — N189 Chronic kidney disease, unspecified: Secondary | ICD-10-CM

## 2024-03-12 DIAGNOSIS — R531 Weakness: Secondary | ICD-10-CM | POA: Diagnosis present

## 2024-03-12 DIAGNOSIS — R9431 Abnormal electrocardiogram [ECG] [EKG]: Secondary | ICD-10-CM | POA: Diagnosis present

## 2024-03-12 DIAGNOSIS — E8729 Other acidosis: Secondary | ICD-10-CM | POA: Diagnosis present

## 2024-03-12 DIAGNOSIS — Z862 Personal history of diseases of the blood and blood-forming organs and certain disorders involving the immune mechanism: Secondary | ICD-10-CM

## 2024-03-12 DIAGNOSIS — R2689 Other abnormalities of gait and mobility: Secondary | ICD-10-CM | POA: Diagnosis not present

## 2024-03-12 DIAGNOSIS — E875 Hyperkalemia: Secondary | ICD-10-CM | POA: Diagnosis not present

## 2024-03-12 DIAGNOSIS — E11311 Type 2 diabetes mellitus with unspecified diabetic retinopathy with macular edema: Secondary | ICD-10-CM

## 2024-03-12 DIAGNOSIS — N19 Unspecified kidney failure: Secondary | ICD-10-CM

## 2024-03-12 DIAGNOSIS — K219 Gastro-esophageal reflux disease without esophagitis: Secondary | ICD-10-CM

## 2024-03-12 DIAGNOSIS — J90 Pleural effusion, not elsewhere classified: Secondary | ICD-10-CM | POA: Diagnosis not present

## 2024-03-12 LAB — COMPREHENSIVE METABOLIC PANEL WITH GFR
ALT: 37 U/L (ref 0–44)
AST: 22 U/L (ref 15–41)
Albumin: 3.5 g/dL (ref 3.5–5.0)
Alkaline Phosphatase: 80 U/L (ref 38–126)
Anion gap: 22 — ABNORMAL HIGH (ref 5–15)
BUN: 186 mg/dL — ABNORMAL HIGH (ref 6–20)
CO2: 11 mmol/L — ABNORMAL LOW (ref 22–32)
Calcium: 6 mg/dL — CL (ref 8.9–10.3)
Chloride: 108 mmol/L (ref 98–111)
Creatinine, Ser: 30.5 mg/dL — ABNORMAL HIGH (ref 0.61–1.24)
GFR, Estimated: 1 mL/min — ABNORMAL LOW (ref >60)
Glucose, Bld: 120 mg/dL — ABNORMAL HIGH (ref 70–99)
Potassium: 5.7 mmol/L — ABNORMAL HIGH (ref 3.5–5.1)
Sodium: 141 mmol/L (ref 135–145)
Total Bilirubin: 0.6 mg/dL (ref 0.0–1.2)
Total Protein: 7.2 g/dL (ref 6.5–8.1)

## 2024-03-12 LAB — MAGNESIUM: Magnesium: 1.5 mg/dL — ABNORMAL LOW (ref 1.7–2.4)

## 2024-03-12 LAB — CBC WITH DIFFERENTIAL/PLATELET
Abs Immature Granulocytes: 0.03 K/uL (ref 0.00–0.07)
Basophils Absolute: 0 K/uL (ref 0.0–0.1)
Basophils Relative: 0 %
Eosinophils Absolute: 0.3 K/uL (ref 0.0–0.5)
Eosinophils Relative: 5 %
HCT: 24.3 % — ABNORMAL LOW (ref 39.0–52.0)
Hemoglobin: 7.6 g/dL — ABNORMAL LOW (ref 13.0–17.0)
Immature Granulocytes: 0 %
Lymphocytes Relative: 19 %
Lymphs Abs: 1.3 K/uL (ref 0.7–4.0)
MCH: 27.7 pg (ref 26.0–34.0)
MCHC: 31.3 g/dL (ref 30.0–36.0)
MCV: 88.7 fL (ref 80.0–100.0)
Monocytes Absolute: 0.5 K/uL (ref 0.1–1.0)
Monocytes Relative: 8 %
Neutro Abs: 4.6 K/uL (ref 1.7–7.7)
Neutrophils Relative %: 68 %
Platelets: 57 K/uL — ABNORMAL LOW (ref 150–400)
RBC: 2.74 MIL/uL — ABNORMAL LOW (ref 4.22–5.81)
RDW: 16.3 % — ABNORMAL HIGH (ref 11.5–15.5)
Smear Review: DECREASED
WBC: 6.8 K/uL (ref 4.0–10.5)
nRBC: 0 % (ref 0.0–0.2)

## 2024-03-12 LAB — TROPONIN I (HIGH SENSITIVITY)
Troponin I (High Sensitivity): 48 ng/L — ABNORMAL HIGH (ref <18)
Troponin I (High Sensitivity): 56 ng/L — ABNORMAL HIGH (ref <18)

## 2024-03-12 LAB — BRAIN NATRIURETIC PEPTIDE: B Natriuretic Peptide: 950.4 pg/mL — ABNORMAL HIGH (ref 0.0–100.0)

## 2024-03-12 LAB — TYPE AND SCREEN
ABO/RH(D): O POS
Antibody Screen: NEGATIVE

## 2024-03-12 LAB — HEPATITIS B SURFACE ANTIGEN: Hepatitis B Surface Ag: NONREACTIVE

## 2024-03-12 MED ORDER — CHLORHEXIDINE GLUCONATE CLOTH 2 % EX PADS
6.0000 | MEDICATED_PAD | Freq: Every day | CUTANEOUS | Status: DC
  Administered 2024-03-13 – 2024-03-14 (×2): 6 via TOPICAL

## 2024-03-12 MED ORDER — INSULIN ASPART 100 UNIT/ML IJ SOLN
0.0000 [IU] | Freq: Three times a day (TID) | INTRAMUSCULAR | Status: DC
  Administered 2024-03-13 – 2024-03-14 (×2): 1 [IU] via SUBCUTANEOUS

## 2024-03-12 MED ORDER — ACETAMINOPHEN 650 MG RE SUPP
650.0000 mg | Freq: Four times a day (QID) | RECTAL | Status: DC | PRN
Start: 2024-03-12 — End: 2024-03-15

## 2024-03-12 MED ORDER — ACETAMINOPHEN 325 MG PO TABS: 650.0000 mg | ORAL_TABLET | Freq: Four times a day (QID) | ORAL | Status: DC | PRN

## 2024-03-12 MED ORDER — CALCIUM GLUCONATE-NACL 1-0.675 GM/50ML-% IV SOLN
1.0000 g | Freq: Once | INTRAVENOUS | Status: AC
  Administered 2024-03-12: 1000 mg via INTRAVENOUS
  Filled 2024-03-12: qty 50

## 2024-03-12 MED ORDER — MELATONIN 3 MG PO TABS: 3.0000 mg | ORAL_TABLET | Freq: Every evening | ORAL | Status: DC | PRN

## 2024-03-12 MED ORDER — SODIUM BICARBONATE 8.4 % IV SOLN: 50.0000 meq | Freq: Once | INTRAVENOUS | Status: DC

## 2024-03-12 MED ORDER — SODIUM ZIRCONIUM CYCLOSILICATE 10 G PO PACK: 10.0000 g | PACK | Freq: Once | ORAL | Status: DC

## 2024-03-12 MED ORDER — MAGNESIUM SULFATE 2 GM/50ML IV SOLN
2.0000 g | Freq: Once | INTRAVENOUS | Status: AC
  Administered 2024-03-12: 2 g via INTRAVENOUS
  Filled 2024-03-12: qty 50

## 2024-03-12 MED ORDER — ONDANSETRON HCL 4 MG/2ML IJ SOLN
4.0000 mg | Freq: Four times a day (QID) | INTRAMUSCULAR | Status: DC | PRN
  Filled 2024-03-12: qty 2

## 2024-03-12 NOTE — ED Notes (Signed)
 EDP notified of critical lab values.  Calcium  6.0

## 2024-03-12 NOTE — ED Provider Notes (Addendum)
 [place] EMERGENCY DEPARTMENT AT [facility] Provider Note   CSN: [id number] Arrival date & time: 03/12/24  1435     Patient presents with: Shortness of Breath   [name] is a 53 y.o. male.   Patient with concern of missing dialysis for over 4 weeks.  Patient has mild shortness of breath and fatigue.  Patient does not require oxygen at home.  No fevers or chills or new cough.  Historically patient has dialyzed at [facility].  Patient denies blood clot history.  The history is provided by the patient.  Shortness of Breath Associated symptoms: no abdominal pain, no chest pain, no fever, no headaches, no neck pain, no rash and no vomiting        Prior to Admission medications   Medication Sig Start Date End Date Taking? Authorizing Provider  acetaminophen  (TYLENOL ) 500 MG tablet Take 500 mg by mouth every 6 (six) hours as needed for mild pain (pain score 1-3).   Yes [provider]  amLODipine  (NORVASC ) 10 MG tablet Take 1 tablet (10 mg total) by mouth at bedtime. 01/27/24  Yes Romelle Booty, MD  calcium  carbonate (OSCAL) 1500 (600 Ca) MG TABS tablet Take 1,500 mg by mouth 2 (two) times daily with a meal.   Yes [provider]  dapsone  25 MG tablet Take 25 mg by mouth daily.   Yes [provider]  metoCLOPramide  (REGLAN ) 5 MG tablet Take 1 tablet (5 mg total) by mouth every 6 (six) hours as needed for nausea or vomiting. 06/12/23  Yes Barbarann Nest, MD  omeprazole  (PRILOSEC) 20 MG capsule Take 1 capsule (20 mg total) by mouth daily. 12/20/17  Yes Caro Harlene POUR, NP  tacrolimus  (PROGRAF ) 1 MG capsule Take 3 capsules (3 mg total) by mouth 2 (two) times daily. Patient taking differently: Take 1 mg by mouth 2 (two) times daily. 02/05/19  Yes Dahal, Chapman, MD  Accu-Chek FastClix Lancets MISC 1 each by In Vitro route 4 (four) times daily. DX:E11.9 02/13/19   Eubanks, Jessica K, NP  glucose blood (ACCU-CHEK AVIVA PLUS) test strip 1 each by Other  route 4 (four) times daily. DX:E11.9 02/13/19   Eubanks, Jessica K, NP    Allergies: Lactose intolerance (gi), Other, Pollen extract, and Tape    Review of Systems  Constitutional:  Positive for fatigue. Negative for chills and fever.  HENT:  Negative for congestion.   Eyes:  Negative for visual disturbance.  Respiratory:  Positive for shortness of breath.   Cardiovascular:  Negative for chest pain.  Gastrointestinal:  Negative for abdominal pain and vomiting.  Genitourinary:  Negative for dysuria and flank pain.  Musculoskeletal:  Negative for back pain, neck pain and neck stiffness.  Skin:  Negative for rash.  Neurological:  Negative for light-headedness and headaches.    Updated Vital Signs BP (!) 150/82   Pulse (!) 101   Temp 97.9 F (36.6 C)   Resp 15   Ht 5' 5 (1.651 m)   Wt 61.2 kg   SpO2 100%   BMI 22.47 kg/m   Physical Exam Vitals and nursing note reviewed.  Constitutional:      General: He is not in acute distress.    Appearance: He is well-developed.  HENT:     Head: Normocephalic and atraumatic.     Mouth/Throat:     Mouth: Mucous membranes are moist.  Eyes:     General:        Right eye: No discharge.  Left eye: No discharge.     Conjunctiva/sclera: Conjunctivae normal.  Neck:     Trachea: No tracheal deviation.  Cardiovascular:     Rate and Rhythm: Regular rhythm. Tachycardia present.     Heart sounds: No murmur heard. Pulmonary:     Effort: Pulmonary effort is normal.     Breath sounds: Examination of the right-lower field reveals rhonchi. Examination of the left-lower field reveals rhonchi. Rhonchi present.  Abdominal:     General: There is no distension.     Palpations: Abdomen is soft.     Tenderness: There is no abdominal tenderness. There is no guarding.  Musculoskeletal:     Cervical back: Normal range of motion and neck supple. No rigidity.     Right lower leg: No tenderness.     Left lower leg: No tenderness.  Skin:    General:  Skin is warm.     Capillary Refill: Capillary refill takes less than 2 seconds.     Findings: No rash.  Neurological:     General: No focal deficit present.     Mental Status: He is alert.     Cranial Nerves: No cranial nerve deficit.  Psychiatric:        Mood and Affect: Mood normal.     (all labs ordered are listed, but only abnormal results are displayed) Labs Reviewed  CBC WITH DIFFERENTIAL/PLATELET - Abnormal; Notable for the following components:      Result Value   RBC 2.74 (*)    Hemoglobin 7.6 (*)    HCT 24.3 (*)    RDW 16.3 (*)    Platelets 57 (*)    All other components within normal limits  COMPREHENSIVE METABOLIC PANEL WITH GFR - Abnormal; Notable for the following components:   Potassium 5.7 (*)    CO2 11 (*)    Glucose, Bld 120 (*)    BUN 186 (*)    Creatinine, Ser 30.50 (*)    Calcium  6.0 (*)    GFR, Estimated 1 (*)    Anion gap 22 (*)    All other components within normal limits  MAGNESIUM  - Abnormal; Notable for the following components:   Magnesium  1.5 (*)    All other components within normal limits  TROPONIN I (HIGH SENSITIVITY) - Abnormal; Notable for the following components:   Troponin I (High Sensitivity) 48 (*)    All other components within normal limits  BRAIN NATRIURETIC PEPTIDE  TROPONIN I (HIGH SENSITIVITY)    EKG: EKG Interpretation Date/Time:  Monday March 12 2024 16:29:14 EDT Ventricular Rate:  98 PR Interval:  154 QRS Duration:  101 QT Interval:  416 QTC Calculation: 532 R Axis:   -58  Text Interpretation: Sinus rhythm Left anterior fascicular block LVH with secondary repolarization abnormality Prolonged QT interval Confirmed by Tonia Chew [phone]) on 03/12/2024 4:33:11 PM  Radiology: [facility] Chest 2 View Result Date: 03/12/2024 CLINICAL DATA:  Shortness of breath.  No dialysis for over a month. EXAM: CHEST - 2 VIEW COMPARISON:  01/25/2024 FINDINGS: Mild cardiac enlargement. Mild central vascular congestion. No edema. Small right  and moderate left pleural effusions. Left lung base opacity likely representing compressive atelectasis. Vascular graft in the left upper mediastinum. Mediastinal contours appear intact. No pneumothorax. IMPRESSION: 1. Cardiac enlargement with mild vascular congestion. 2. Moderate left and small right pleural effusions with basilar atelectasis or infiltration. Electronically Signed   By: Elsie Gravely M.D.   On: 03/12/2024 16:10     .Critical Care  Performed  by: Tonia Chew, MD Authorized by: Tonia Chew, MD   Critical care provider statement:    Critical care time (minutes):  75   Critical care start time:  03/12/2024 6:00 PM   Critical care end time:  03/12/2024 7:15 PM   Critical care time was exclusive of:  Separately billable procedures and treating other patients and teaching time   Critical care was necessary to treat or prevent imminent or life-threatening deterioration of the following conditions:  Metabolic crisis   Critical care was time spent personally by me on the following activities:  Ordering and review of laboratory studies, ordering and review of radiographic studies, ordering and performing treatments and interventions, pulse oximetry and re-evaluation of patient's condition    Medications Ordered in the ED  calcium  gluconate 1 g/ 50 mL sodium chloride  IVPB (1,000 mg Intravenous New Bag/Given 03/12/24 1820)  magnesium  sulfate IVPB 2 g 50 mL (has no administration in time range)                                    Medical Decision Making Amount and/or Complexity of Data Reviewed Labs: ordered. ECG/medicine tests: ordered.  Risk Prescription drug management.   Patient with known history of end-stage renal disease requiring dialysis has not dialyzed in over 4 weeks presents with clinical concern for worsening uremia and requiring dialysis urgently.  Patient not requiring oxygen, normal work of breathing in the room, mild elevated blood pressure and mild  tachycardia.  Afebrile.  Chest x-ray and peds reviewed showing mild venous congestion/cardiomegaly.  Blood work independent reviewed showing hypocalcemia, mild hyperkalemia and mild worsening anemia 7.6.  IV calcium  ordered, magnesium  low, IV magnesium  ordered.  Discussed with on-call nephrology recommends admitting to medicine and they will evaluate the patient.  Medical records reviewed June 13 patient left AMA after admission for similar.  Patient okay with admission today.  EKG independent reviewed no peaked T waves, sinus rhythm.  Prolonged QT interval.   Family medicine is capped and saw patient over 30 days ago and recommend hospitalist admit.  Repaged hospitalist.  Final diagnoses:  ESRD needing dialysis (HCC)  Hypocalcemia  Uremia  Hyperkalemia  Prolonged Q-T interval on ECG    ED Discharge Orders     None          Tonia Chew, MD 03/12/24 RETHA Tonia Chew, MD 03/12/24 [phone]

## 2024-03-12 NOTE — ED Triage Notes (Signed)
 POV/ by self/ ambulatory/ pt states he gets hemodialysis T,TH, SAT/ states he has not had dialysis in over a month d/t lack of transportation/ A&OX4

## 2024-03-12 NOTE — ED Provider Triage Note (Signed)
 Emergency Medicine Provider Triage Evaluation Note  [name] , a 53 y.o. male  was evaluated in triage.  Pt complains of slight slurred breath.  He had missed dialysis for a month and went back to get dialyzed and they recommended he come be seen prior to them dialyzing him.  He denies chest pain.  Endorses mild shortness of breath.  No swelling or other concerns.  Review of Systems  Positive: As above Negative: As above  Physical Exam  BP (!) 156/75 (BP Location: Right Arm)   Pulse (!) 107   Temp 97.9 F (36.6 C)   Resp 18   Ht 5' 5 (1.651 m)   Wt 61.2 kg   SpO2 99%   BMI 22.47 kg/m  Gen:   Awake, no distress   Resp:  Normal effort  MSK:   Moves extremities without difficulty  Other:    Medical Decision Making  Medically screening exam initiated at 3:24 PM.  Appropriate orders placed.  [name] was informed that the remainder of the evaluation will be completed by another provider, this initial triage assessment does not replace that evaluation, and the importance of remaining in the ED until their evaluation is complete.    Alexis Loge, PA-C 03/12/24 1525

## 2024-03-12 NOTE — Consult Note (Addendum)
 [facility] Nephrology Consult Note: Reason for Consult: To manage dialysis and dialysis related needs Referring Physician: Dr. Marcene, Eva FURY  HPI:  [name] is an 53 y.o. male with past medical history significant for hypertension, anemia, ESRD on HD, DM, nonadherence with outpatient dialysis presented with uremia and shortness of breath, seen as a consultation for the management of ESRD. The patient said his last dialysis was when he was in the hospital back in 01/2024.  Reportedly he went to his center after a long interval when he was advised to go to the hospital to check lab.  The patient did not come to hospital until today.  During last hospitalization he left AMA without arranging outpatient transportation etc.  Patient said his outpatient transportation to dialysis is not straight.  In the ER he is afebrile, BP elevated, oxygen saturation acceptable.  The labs showed potassium 5.7, CO2 11, BUN 186, creatinine 30.5, calcium  6 with low magnesium  and albumin of 3.5.  Hemoglobin is 7.6.  He received calcium  gluconate, Lokelma , sodium bicarbonate , magnesium , insulin  dextrose  in the ER.  Chest x-ray with cardiac enlargement with mild vascular congestion, moderate left and small right pleural effusion.  Admitted for further management.  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 7/?/2011   Obese    Occult blood positive stool    Psoriasis    scalp   Thrombocytopenia (HCC)    Vitamin B 12 deficiency     Past Surgical History:  Procedure Laterality Date   AV FISTULA PLACEMENT     left   AV FISTULA PLACEMENT  09/12/2012   Procedure: INSERTION OF ARTERIOVENOUS (AV) GORE-TEX GRAFT ARM;  Surgeon: Carlin FORBES Harvey, MD;  Location: [facility] OR;  Service: Vascular;  Laterality: Left;  KIDNEY TRANSPLANT FAILED   BIOPSY  02/01/2019   Procedure: BIOPSY;  Surgeon: Saintclair Jasper, MD;  Location: [facility] ENDOSCOPY;  Service: Gastroenterology;;   CATARACT EXTRACTION, BILATERAL     COLONOSCOPY WITH PROPOFOL  N/A 02/01/2019  Procedure: COLONOSCOPY WITH PROPOFOL ;  Surgeon: Saintclair Jasper, MD;  Location: [facility] ENDOSCOPY;  Service: Gastroenterology;  Laterality: N/A;   EYE SURGERY     INSERTION OF DIALYSIS CATHETER  07/14/2012   Procedure: INSERTION OF DIALYSIS CATHETER;  Surgeon: Lynwood JONETTA Collum, MD;  Location: [facility] OR;  Service: Vascular;  Laterality: Right;  Ultrasound Guided Insertion of Internal Jugular Dialysis Catheter   IR THROMBECTOMY AV FISTULA W/THROMBOLYSIS/PTA INC/SHUNT/IMG LEFT Left 12/28/2018   IR THROMBECTOMY AV  FISTULA W/THROMBOLYSIS/PTA INC/SHUNT/IMG LEFT Left 06/10/2023   IR US  GUIDE VASC ACCESS LEFT  12/28/2018   IR US  GUIDE VASC ACCESS LEFT  06/14/2023   KIDNEY TRANSPLANT  2013   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   KIDNEY TRANSPLANT  12/2013   second kidney transplant   lumbar disc sugery   10/17/2017   LUMBAR DISC SURGERY     NEPHRECTOMY TRANSPLANTED ORGAN  08/11/12   PHOTOCOAGULATION     for diabetic retinopathy 08/1999   SHUNTOGRAM N/A 07/11/2012   Procedure: RICH;  Surgeon: Gaile LELON New, MD;  Location: [facility] C[facility] LAB;  Service: Cardiovascular;  Laterality: N/A;    Family History  Problem Relation Age of Onset   Diabetes Father    Kidney disease Father    Other Father        amputation   Hypertension Mother    Dementia Mother    Diabetes Sister    Hypertension Sister    Diabetes Sister    Kidney disease Sister    Diabetes Sister    Hypertension Brother    Diabetes Brother    CAD Neg Hx    Sudden Cardiac Death Neg Hx    Congestive Heart Failure Neg Hx    Colon cancer Neg Hx    Rectal cancer Neg Hx    Esophageal cancer Neg Hx    Liver cancer Neg Hx     Social History:  reports that he has never smoked. He has never used smokeless tobacco. He reports that he does not drink alcohol and does not use drugs.  Allergies:  Allergies  Allergen Reactions   Lactose Intolerance (Gi) Diarrhea    No whole milk!!   Other Other (See Comments)    Soy milk burns his tongue   Pollen Extract Other (See Comments)    Itching, watery eyes and sneezing.   Tape Rash    PLEASE USE COBAN WRAP Pt can't have paper tape    Medications: I have reviewed the patient's current medications.   Results for orders placed or performed during the hospital encounter of 03/12/24 (from the past 48 hours)  CBC with Differential     Status: Abnormal   Collection Time: 03/12/24  3:50 PM  Result Value Ref Range   WBC 6.8 4.0 - 10.5 K/uL   RBC 2.74 (L) 4.22 - 5.81 MIL/uL   Hemoglobin 7.6 (L)  13.0 - 17.0 g/dL   HCT 75.6 (L) 60.9 - 47.9 %   MCV 88.7 80.0 - 100.0 fL   MCH 27.7 26.0 - 34.0 pg   MCHC 31.3 30.0 - 36.0 g/dL   RDW 83.6 (H) 88.4 - 84.4 %   Platelets 57 (L) 150 - 400 K/uL    Comment: SPECIMEN CHECKED FOR CLOTS Immature Platelet Fraction may be clinically indicated, consider ordering this additional test [id number] REPEATED TO VERIFY PLATELET COUNT CONFIRMED BY SMEAR    nRBC 0.0 0.0 - 0.2 %   Neutrophils Relative % 68 %   Neutro Abs 4.6 1.7 -  7.7 K/uL   Lymphocytes Relative 19 %   Lymphs Abs 1.3 0.7 - 4.0 K/uL   Monocytes Relative 8 %   Monocytes Absolute 0.5 0.1 - 1.0 K/uL   Eosinophils Relative 5 %   Eosinophils Absolute 0.3 0.0 - 0.5 K/uL   Basophils Relative 0 %   Basophils Absolute 0.0 0.0 - 0.1 K/uL   WBC Morphology MORPHOLOGY UNREMARKABLE    RBC Morphology MORPHOLOGY UNREMARKABLE    Smear Review PLATELETS APPEAR DECREASED    Immature Granulocytes 0 %   Abs Immature Granulocytes 0.03 0.00 - 0.07 K/uL    Comment: Performed at [facility], [address], [place], [place] [address]  Comprehensive metabolic panel     Status: Abnormal   Collection Time: 03/12/24  3:50 PM  Result Value Ref Range   Sodium 141 135 - 145 mmol/L   Potassium 5.7 (H) 3.5 - 5.1 mmol/L   Chloride 108 98 - 111 mmol/L   CO2 11 (L) 22 - 32 mmol/L   Glucose, Bld 120 (H) 70 - 99 mg/dL    Comment: Glucose reference range applies only to samples taken after fasting for at least 8 hours.   BUN 186 (H) 6 - 20 mg/dL   Creatinine, Ser 69.49 (H) 0.61 - 1.24 mg/dL    Comment: ELECTROLYTES REPEATED TO VERIFY RESULT CONFIRMED BY MANUAL DILUTION    Calcium  6.0 (LL) 8.9 - 10.3 mg/dL    Comment: CRITICAL RESULT CALLED TO, READ BACK BY AND VERIFIED WITH C ROSS RN 03/12/2024 1747 DAVISB   Total Protein 7.2 6.5 - 8.1 g/dL   Albumin 3.5 3.5 - 5.0 g/dL   AST 22 15 - 41 U/L   ALT 37 0 - 44 U/L   Alkaline Phosphatase 80 38 - 126 U/L   Total Bilirubin 0.6 0.0 - 1.2 mg/dL   GFR, Estimated 1  (L) >60 mL/min    Comment: (NOTE) Calculated using the CKD-EPI Creatinine Equation (2021)    Anion gap 22 (H) 5 - 15    Comment: ELECTROLYTES REPEATED TO VERIFY Performed at [facility], [address], [place], [place] [address]   Magnesium      Status: Abnormal   Collection Time: 03/12/24  3:50 PM  Result Value Ref Range   Magnesium  1.5 (L) 1.7 - 2.4 mg/dL    Comment: Performed at [facility], [address] [place], [place] [address]  Troponin I (High Sensitivity)     Status: Abnormal   Collection Time: 03/12/24  3:50 PM  Result Value Ref Range   Troponin I (High Sensitivity) 48 (H) <18 ng/L    Comment: (NOTE) Elevated high sensitivity troponin I (hsTnI) values and significant  changes across serial measurements may suggest ACS but many other  chronic and acute conditions are known to elevate hsTnI results.  Refer to the Links section for chest pain algorithms and additional  guidance. Performed at [facility], [address] [place], [place] [address]   Troponin I (High Sensitivity)     Status: Abnormal   Collection Time: 03/12/24  6:14 PM  Result Value Ref Range   Troponin I (High Sensitivity) 56 (H) <18 ng/L    Comment: (NOTE) Elevated high sensitivity troponin I (hsTnI) values and significant  changes across serial measurements may suggest ACS but many other  chronic and acute conditions are known to elevate hsTnI results.  Refer to the Links section for chest pain algorithms and additional  guidance. Performed at  [facility], [address], [place], [place] [address]     DG Chest 2 View Result Date: 03/12/2024 CLINICAL DATA:  Shortness of breath.  No dialysis for over a month. EXAM: CHEST - 2 VIEW COMPARISON:  01/25/2024 FINDINGS: Mild cardiac enlargement. Mild central vascular congestion. No edema. Small right and moderate left pleural effusions. Left lung base opacity likely representing compressive atelectasis. Vascular graft in  the left upper mediastinum. Mediastinal contours appear intact. No pneumothorax. IMPRESSION: 1. Cardiac enlargement with mild vascular congestion. 2. Moderate left and small right pleural effusions with basilar atelectasis or infiltration. Electronically Signed   By: Elsie Gravely M.D.   On: 03/12/2024 16:10    ROS: As per H&P, rest of the systems reviewed and negative. Blood pressure (!) 150/82, pulse (!) 101, temperature 97.8 F (36.6 C), temperature source Oral, resp. rate 15, height 5' 5 (1.651 m), weight 61.2 kg, SpO2 100%. Gen: NAD, comfortable Respiratory: Clear bilateral, no wheezing or crackle Cardiovascular: Regular rate rhythm S1-S2 normal, no rubs GI: Abdomen soft, nontender, nondistended Extremities, no cyanosis or clubbing, no edema Skin: No rash or ulcer Neurology: Alert, awake, following commands, oriented Dialysis Access: AV graft has good thrill and bruit.  Assessment/Plan:  # ESRD on HD, nonadherence with outpatient treatment with azotemia/shortness of breath: The BUN is very high and it seems like his last dialysis was more than a month ago.  We will plan to do dialysis at a slow start, AVG for the access.  We will  need to inform renal navigator to look into outpatient transportation to dialysis unit.  # Hyperkalemia: Received medical treatment in the ER plan for dialysis.  # Hypertension/volume: UF with HD, resume home medication.  # Anemia of ESRD: Check iron level, dose erythropoietin.  # Metabolic Bone Disease: Check phosphorus, PTH level and adjust medication.  Thank you for the consult, we will continue to follow.  Jaksen Fiorella Amelie Romney 03/12/2024, 8:09 PM

## 2024-03-12 NOTE — H&P (Signed)
 History and Physical      [name] FMW:[id number] DOB: [birth year] DOA: 03/12/2024; DOS: 03/12/2024  PCP: Patient, No Pcp Per *** Patient coming from: home ***  I have personally briefly reviewed patient's old medical records in [facility] Link  Chief Complaint: ***  HPI: [name] is a 53 y.o. male with medical history significant for *** who is admitted to [facility] on 03/12/2024 with *** after presenting from home*** to [facility] ED complaining of ***.    ***       ***   ED Course:  Vital signs in the ED were notable for the following: ***  Labs were notable for the following: ***  Per my interpretation, EKG in ED demonstrated the following:  ***  Imaging in the ED, per corresponding formal radiology read, was notable for the following:  ***  EDP has discussed with on-call nephrology, Dr. Elinda, who will formally consult and plans for dialysis either overnight or tomorrow.   While in the ED, the following were administered: ***  Subsequently, the patient was admitted  ***  ***red    Review of Systems: As per HPI otherwise 10 point review of systems negative.   Past Medical History:  Diagnosis Date   Acute renal failure superimposed on stage 5 chronic kidney disease, not on chronic dialysis (HCC) 11/29/2017   Adequate hearing function    Anemia    CKD (chronic kidney disease), stage III (HCC)    Diabetes mellitus 12/2008   A1C 8.3 12/23/08, 24hr urine protein 2793 mg/day, SPEP neg, proliferative BDR s/p photocoagulation 08/2009 done at [facility] and f/u by DR Groat   DKA (diabetic ketoacidosis) (HCC) 12/24/2018   IMO SNOMED Dx Update Oct 2024     ESRD (end stage renal disease) on dialysis (HCC)    1.Initiated HD via right per cate 12/30/2008 (MWF schedule at [facility]) 2.Left upper extremity A-V fistula by Dr Harvey 12/30/2008 3.Aemia: Received Venofer  12/30/08 Ferritin 179 % sat 12 12/23/08, 4. Secondary Hyperparathyroidism- PTH 188, P  5.4, Ca 9.2 12/2008   Has poorly balanced diet    Hemodialysis-associated hypotension    coreg  d/c'd   Herniated lumbar disc without myelopathy    History of blood transfusion    Hyperlipemia    Hyperparathyroidism due to renal insufficiency (HCC)    Hypertension    resloved after initating HD, now with post HD hypotension   Hypocalcemia    Hypomagnesemia    Kidney transplant as cause of abnormal reaction or later complication 2015   [facility]   Non-ischemic cardiomyopathy (HCC)    EF 20-25% 12/23/2008>>EF 40-45% 03/2009>>EF ??? 7/?/2011   Obese    Occult blood positive stool    Psoriasis    scalp   Thrombocytopenia (HCC)    Vitamin B 12 deficiency     Past Surgical History:  Procedure Laterality Date   AV FISTULA PLACEMENT     left   AV FISTULA PLACEMENT  09/12/2012   Procedure: INSERTION OF ARTERIOVENOUS (AV) GORE-TEX GRAFT ARM;  Surgeon: Carlin FORBES Harvey, MD;  [facility] OR;  Service: Vascular;  Laterality: Left;  KIDNEY TRANSPLANT FAILED   BIOPSY  02/01/2019   Procedure: BIOPSY;  Surgeon: Saintclair Jasper, MD;  Location: [facility] ENDOSCOPY;  Service: Gastroenterology;;   CATARACT EXTRACTION, BILATERAL     COLONOSCOPY WITH PROPOFOL  N/A 02/01/2019   Procedure: COLONOSCOPY WITH PROPOFOL ;  Surgeon: Saintclair Jasper, MD;  Location: [facility] ENDOSCOPY;  Service: Gastroenterology;  Laterality: N/A;  EYE SURGERY     INSERTION OF DIALYSIS CATHETER  07/14/2012   Procedure: INSERTION OF DIALYSIS CATHETER;  Surgeon: Lynwood JONETTA Collum, MD;  Location: [facility] OR;  Service: Vascular;  Laterality: Right;  Ultrasound Guided Insertion of Internal Jugular Dialysis Catheter   IR THROMBECTOMY AV FISTULA W/THROMBOLYSIS/PTA INC/SHUNT/IMG LEFT Left 12/28/2018   IR THROMBECTOMY AV FISTULA W/THROMBOLYSIS/PTA INC/SHUNT/IMG LEFT Left 06/10/2023   IR US  GUIDE VASC ACCESS LEFT  12/28/2018   IR US  GUIDE VASC ACCESS LEFT  06/14/2023   KIDNEY TRANSPLANT  2013   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 He has never used smokeless tobacco. He reports that he does not drink alcohol and does not use drugs.   Allergies  Allergen Reactions   Lactose Intolerance (Gi) Diarrhea    No whole milk!!   Other Other (See Comments)    Soy milk burns his tongue   Pollen Extract Other (See Comments)    Itching, watery eyes and sneezing.   Tape Rash    PLEASE USE COBAN WRAP Pt can't have paper tape    Family History  Problem Relation Age of Onset   Diabetes Father    Kidney disease Father    Other Father        amputation   Hypertension Mother    Dementia Mother    Diabetes Sister    Hypertension Sister    Diabetes Sister    Kidney disease Sister    Diabetes Sister    Hypertension Brother    Diabetes Brother    CAD Neg Hx    Sudden Cardiac Death Neg Hx    Congestive Heart Failure Neg Hx    Colon cancer Neg Hx    Rectal cancer Neg Hx    Esophageal cancer Neg Hx    Liver cancer Neg Hx     Family history reviewed and not pertinent ***   Prior to Admission medications   Medication Sig Start Date End Date Taking? Authorizing Provider  acetaminophen  (TYLENOL ) 500 MG tablet Take 500 mg by mouth every 6 (six) hours as needed for mild pain (pain score 1-3).   Yes [provider]  amLODipine  (NORVASC ) 10 MG tablet Take 1 tablet (10 mg total) by mouth at bedtime. 01/27/24  Yes Romelle Booty, MD  calcium  carbonate (OSCAL) 1500 (600 Ca) MG TABS tablet Take 1,500 mg by mouth 2 (two) times daily with a meal.   Yes [provider]  dapsone  25 MG tablet Take 25 mg by mouth daily.   Yes  [provider]  metoCLOPramide  (REGLAN ) 5 MG tablet Take 1 tablet (5 mg total) by mouth every 6 (six) hours as needed for nausea or vomiting. 06/12/23  Yes Barbarann Nest, MD  omeprazole  (PRILOSEC) 20 MG capsule Take 1 capsule (20 mg total) by mouth daily. 12/20/17  Yes Caro Harlene POUR, NP  tacrolimus  (PROGRAF ) 1 MG capsule Take 3 capsules (3 mg total) by mouth 2 (two) times daily. Patient taking differently: Take 1 mg by mouth 2 (two) times  daily. 02/05/19  Yes Dahal, Chapman, MD  Accu-Chek FastClix Lancets MISC 1 each by In Vitro route 4 (four) times daily. DX:E11.9 02/13/19   Eubanks, Jessica K, NP  glucose blood (ACCU-CHEK AVIVA PLUS) test strip 1 each by Other route 4 (four) times daily. DX:E11.9 02/13/19   Caro Harlene POUR, NP     Objective    Physical Exam: Vitals:   03/12/24 1515 03/12/24 1517 03/12/24 1730 03/12/24 1902  BP:   (!) 150/82   Pulse:   (!) 101   Resp:   15   Temp:    97.8 F (36.6 C)  TempSrc:    Oral  SpO2:   100%   Weight:  61.2 kg    Height: 5' 5 (1.651 m)       General: appears to be stated age; alert, oriented Skin: warm, dry, no rash Head:  AT/[place] Mouth:  Oral mucosa membranes appear moist, normal dentition Neck: supple; trachea midline Heart:  RRR; did not appreciate any M/R/G Lungs: CTAB, did not appreciate any wheezes, rales, or rhonchi Abdomen: + BS; soft, ND, NT Vascular: 2+ pedal pulses b/l; 2+ radial pulses b/l Extremities: no peripheral edema, no muscle wasting Neuro: strength and sensation intact in upper and lower extremities b/l ***   *** Neuro: 5/5 strength of the proximal and distal flexors and extensors of the upper and lower extremities bilaterally; sensation intact in upper and lower extremities b/l; cranial nerves II through XII grossly intact; no pronator drift; no evidence suggestive of slurred speech, dysarthria, or facial droop; Normal muscle tone. No tremors.  *** Neuro: In the setting of the patient's current  mental status and associated inability to follow instructions, unable to perform full neurologic exam at this time.  As such, assessment of strength, sensation, and cranial nerves is limited at this time. Patient noted to spontaneously move all 4 extremities. No tremors.  ***    Labs on Admission: I have personally reviewed following labs and imaging studies  CBC: Recent Labs  Lab 03/12/24 1550  WBC 6.8  NEUTROABS 4.6  HGB 7.6*  HCT 24.3*  MCV 88.7  PLT 57*   Basic Metabolic Panel: Recent Labs  Lab 03/12/24 1550  NA 141  K 5.7*  CL 108  CO2 11*  GLUCOSE 120*  BUN 186*  CREATININE 30.50*  CALCIUM  6.0*  MG 1.5*   GFR: Estimated Creatinine Clearance: 2.4 mL/min (A) (by C-G formula based on SCr of 30.5 mg/dL (H)). Liver Function Tests: Recent Labs  Lab 03/12/24 1550  AST 22  ALT 37  ALKPHOS 80  BILITOT 0.6  PROT 7.2  ALBUMIN 3.5   No results for input(s): LIPASE, AMYLASE in the last 168 hours. No results for input(s): AMMONIA in the last 168 hours. Coagulation Profile: No results for input(s): INR, PROTIME in the last 168 hours. Cardiac Enzymes: No results for input(s): CKTOTAL, CKMB, CKMBINDEX, TROPONINI in the last 168 hours. BNP (last 3 results) No results for input(s): PROBNP in the last 8760 hours. HbA1C: No results for input(s): HGBA1C in the last 72 hours. CBG: No results for input(s): GLUCAP in the last 168 hours. Lipid Profile: No results for input(s): CHOL, HDL, LDLCALC, TRIG, CHOLHDL, LDLDIRECT in the last 72 hours. Thyroid  Function Tests: No results for input(s): TSH, T4TOTAL, FREET4, T3FREE, THYROIDAB in the last 72 hours. Anemia Panel: No results for input(s): VITAMINB12, FOLATE, FERRITIN, TIBC, IRON, RETICCTPCT in the last 72 hours. Urine analysis:    Component Value Date/Time   COLORURINE  YELLOW 01/25/2024 1546   APPEARANCEUR HAZY (A) 01/25/2024 1546   LABSPEC 1.013 01/25/2024  1546   PHURINE 5.0 01/25/2024 1546   GLUCOSEU 150 (A) 01/25/2024 1546   HGBUR MODERATE (A) 01/25/2024 1546   BILIRUBINUR NEGATIVE 01/25/2024 1546   KETONESUR NEGATIVE 01/25/2024 1546   PROTEINUR >=300 (A) 01/25/2024 1546   UROBILINOGEN 0.2 12/23/2008 1845   NITRITE NEGATIVE 01/25/2024 1546   LEUKOCYTESUR NEGATIVE 01/25/2024 1546    Radiological Exams on Admission: DG Chest 2 View Result Date: 03/12/2024 CLINICAL DATA:  Shortness of breath.  No dialysis for over a month. EXAM: CHEST - 2 VIEW COMPARISON:  01/25/2024 FINDINGS: Mild cardiac enlargement. Mild central vascular congestion. No edema. Small right and moderate left pleural effusions. Left lung base opacity likely representing compressive atelectasis. Vascular graft in the left upper mediastinum. Mediastinal contours appear intact. No pneumothorax. IMPRESSION: 1. Cardiac enlargement with mild vascular congestion. 2. Moderate left and small right pleural effusions with basilar atelectasis or infiltration. Electronically Signed   By: Elsie Gravely M.D.   On: 03/12/2024 16:10      Assessment/Plan   Principal Problem:   Hyperkalemia   ***            ***                  ***                   ***                  ***                  ***                  ***                   ***                  ***                  ***                  ***                  ***                 ***                ***  DVT prophylaxis: SCD's ***  Code Status: Full code*** Family Communication: none*** Disposition Plan: Per Rounding Team Consults called: EDP has discussed with on-call nephrology, Dr. Elinda, who will formally consult and plans for dialysis either overnight or tomorrow. ;  Admission status: ***     I  SPENT GREATER THAN 75 *** MINUTES IN CLINICAL CARE TIME/MEDICAL DECISION-MAKING IN COMPLETING THIS ADMISSION.      Eva NOVAK Kitty Cadavid DO [facility]  From 7PM - 7AM   03/12/2024, 7:32 PM   ***

## 2024-03-12 NOTE — ED Notes (Signed)
 Wife April Carlyon [phone] would like an update asap

## 2024-03-12 NOTE — ED Triage Notes (Signed)
 Per pt, he attempted to go to [facility] and was told that he needs to be seen by MD before he could return to have HD done there. Pt endorses SOB. Pt denies CP.

## 2024-03-13 DIAGNOSIS — Z8679 Personal history of other diseases of the circulatory system: Secondary | ICD-10-CM

## 2024-03-13 DIAGNOSIS — N186 End stage renal disease: Secondary | ICD-10-CM | POA: Diagnosis not present

## 2024-03-13 DIAGNOSIS — R531 Weakness: Secondary | ICD-10-CM

## 2024-03-13 DIAGNOSIS — R7989 Other specified abnormal findings of blood chemistry: Secondary | ICD-10-CM | POA: Diagnosis present

## 2024-03-13 DIAGNOSIS — E1122 Type 2 diabetes mellitus with diabetic chronic kidney disease: Secondary | ICD-10-CM | POA: Diagnosis not present

## 2024-03-13 DIAGNOSIS — N189 Chronic kidney disease, unspecified: Secondary | ICD-10-CM

## 2024-03-13 DIAGNOSIS — D696 Thrombocytopenia, unspecified: Secondary | ICD-10-CM | POA: Diagnosis present

## 2024-03-13 DIAGNOSIS — E875 Hyperkalemia: Secondary | ICD-10-CM | POA: Diagnosis not present

## 2024-03-13 LAB — BASIC METABOLIC PANEL WITH GFR
Anion gap: 21 — ABNORMAL HIGH (ref 5–15)
Anion gap: 16 — ABNORMAL HIGH (ref 5–15)
BUN: 188 mg/dL — ABNORMAL HIGH (ref 6–20)
BUN: 111 mg/dL — ABNORMAL HIGH (ref 6–20)
CO2: 11 mmol/L — ABNORMAL LOW (ref 22–32)
CO2: 19 mmol/L — ABNORMAL LOW (ref 22–32)
Calcium: 6.2 mg/dL — CL (ref 8.9–10.3)
Calcium: 7.4 mg/dL — ABNORMAL LOW (ref 8.9–10.3)
Chloride: 105 mmol/L (ref 98–111)
Chloride: 101 mmol/L (ref 98–111)
Creatinine, Ser: 28.09 mg/dL — ABNORMAL HIGH (ref 0.61–1.24)
Creatinine, Ser: 16.12 mg/dL — ABNORMAL HIGH (ref 0.61–1.24)
GFR, Estimated: 2 mL/min — ABNORMAL LOW (ref >60)
GFR, Estimated: 3 mL/min — ABNORMAL LOW (ref >60)
Glucose, Bld: 228 mg/dL — ABNORMAL HIGH (ref 70–99)
Glucose, Bld: 140 mg/dL — ABNORMAL HIGH (ref 70–99)
Potassium: 5.7 mmol/L — ABNORMAL HIGH (ref 3.5–5.1)
Potassium: 3.6 mmol/L (ref 3.5–5.1)
Sodium: 137 mmol/L (ref 135–145)
Sodium: 136 mmol/L (ref 135–145)

## 2024-03-13 LAB — COMPREHENSIVE METABOLIC PANEL WITH GFR
ALT: 37 U/L (ref 0–44)
AST: 23 U/L (ref 15–41)
Albumin: 3.4 g/dL — ABNORMAL LOW (ref 3.5–5.0)
Alkaline Phosphatase: 80 U/L (ref 38–126)
Anion gap: 22 — ABNORMAL HIGH (ref 5–15)
BUN: 194 mg/dL — ABNORMAL HIGH (ref 6–20)
CO2: 10 mmol/L — ABNORMAL LOW (ref 22–32)
Calcium: 6.3 mg/dL — CL (ref 8.9–10.3)
Chloride: 108 mmol/L (ref 98–111)
Creatinine, Ser: 29.31 mg/dL — ABNORMAL HIGH (ref 0.61–1.24)
GFR, Estimated: 2 mL/min — ABNORMAL LOW (ref >60)
Glucose, Bld: 202 mg/dL — ABNORMAL HIGH (ref 70–99)
Potassium: 5.2 mmol/L — ABNORMAL HIGH (ref 3.5–5.1)
Sodium: 140 mmol/L (ref 135–145)
Total Bilirubin: 0.6 mg/dL (ref 0.0–1.2)
Total Protein: 7 g/dL (ref 6.5–8.1)

## 2024-03-13 LAB — GLUCOSE, CAPILLARY
Glucose-Capillary: 145 mg/dL — ABNORMAL HIGH (ref 70–99)
Glucose-Capillary: 130 mg/dL — ABNORMAL HIGH (ref 70–99)
Glucose-Capillary: 185 mg/dL — ABNORMAL HIGH (ref 70–99)
Glucose-Capillary: 126 mg/dL — ABNORMAL HIGH (ref 70–99)

## 2024-03-13 LAB — CBC WITH DIFFERENTIAL/PLATELET
Abs Immature Granulocytes: 0.03 K/uL (ref 0.00–0.07)
Basophils Absolute: 0 K/uL (ref 0.0–0.1)
Basophils Relative: 1 %
Eosinophils Absolute: 0.4 K/uL (ref 0.0–0.5)
Eosinophils Relative: 6 %
HCT: 23.1 % — ABNORMAL LOW (ref 39.0–52.0)
Hemoglobin: 7.3 g/dL — ABNORMAL LOW (ref 13.0–17.0)
Immature Granulocytes: 1 %
Lymphocytes Relative: 19 %
Lymphs Abs: 1.3 K/uL (ref 0.7–4.0)
MCH: 27.2 pg (ref 26.0–34.0)
MCHC: 31.6 g/dL (ref 30.0–36.0)
MCV: 86.2 fL (ref 80.0–100.0)
Monocytes Absolute: 0.5 K/uL (ref 0.1–1.0)
Monocytes Relative: 8 %
Neutro Abs: 4.3 K/uL (ref 1.7–7.7)
Neutrophils Relative %: 65 %
Platelets: 54 K/uL — ABNORMAL LOW (ref 150–400)
RBC: 2.68 MIL/uL — ABNORMAL LOW (ref 4.22–5.81)
RDW: 15.9 % — ABNORMAL HIGH (ref 11.5–15.5)
WBC: 6.6 K/uL (ref 4.0–10.5)
nRBC: 0 % (ref 0.0–0.2)

## 2024-03-13 LAB — FERRITIN: Ferritin: 825 ng/mL — ABNORMAL HIGH (ref 24–336)

## 2024-03-13 LAB — PROCALCITONIN: Procalcitonin: 0.43 ng/mL

## 2024-03-13 LAB — IRON AND TIBC
Iron: 107 ug/dL (ref 45–182)
Saturation Ratios: 54 % — ABNORMAL HIGH (ref 17.9–39.5)
TIBC: 199 ug/dL — ABNORMAL LOW (ref 250–450)
UIBC: 92 ug/dL

## 2024-03-13 LAB — TROPONIN I (HIGH SENSITIVITY): Troponin I (High Sensitivity): 58 ng/L — ABNORMAL HIGH (ref <18)

## 2024-03-13 LAB — PHOSPHORUS: Phosphorus: 9.6 mg/dL — ABNORMAL HIGH (ref 2.5–4.6)

## 2024-03-13 LAB — MAGNESIUM: Magnesium: 2 mg/dL (ref 1.7–2.4)

## 2024-03-13 MED ORDER — DARBEPOETIN ALFA 200 MCG/0.4ML IJ SOSY
200.0000 ug | PREFILLED_SYRINGE | INTRAMUSCULAR | Status: DC
  Administered 2024-03-13: 200 ug via SUBCUTANEOUS
  Filled 2024-03-13: qty 0.4

## 2024-03-13 MED ORDER — CALCIUM GLUCONATE-NACL 1-0.675 GM/50ML-% IV SOLN
1.0000 g | Freq: Once | INTRAVENOUS | Status: DC
  Filled 2024-03-13: qty 50

## 2024-03-13 MED ORDER — CALCIUM ACETATE (PHOS BINDER) 667 MG PO CAPS
1334.0000 mg | ORAL_CAPSULE | Freq: Three times a day (TID) | ORAL | Status: DC
  Administered 2024-03-13 – 2024-03-14 (×2): 1334 mg via ORAL
  Filled 2024-03-13 (×4): qty 2

## 2024-03-13 MED ORDER — CALCIUM CARBONATE 1250 (500 CA) MG PO TABS
1250.0000 mg | ORAL_TABLET | Freq: Two times a day (BID) | ORAL | Status: DC
Start: 2024-03-13 — End: 2024-03-15
  Administered 2024-03-13 – 2024-03-14 (×2): 1250 mg via ORAL
  Filled 2024-03-13 (×5): qty 1

## 2024-03-13 MED ORDER — AMLODIPINE BESYLATE 10 MG PO TABS
10.0000 mg | ORAL_TABLET | Freq: Every day | ORAL | Status: DC
  Administered 2024-03-13: 10 mg via ORAL
  Filled 2024-03-13 (×2): qty 1

## 2024-03-13 NOTE — Plan of Care (Signed)

## 2024-03-13 NOTE — Care Management Obs Status (Signed)
 MEDICARE OBSERVATION STATUS NOTIFICATION   Patient Details  Name: [name] MRN: [id number] Date of Birth: March 21, 1971   Medicare Observation Status Notification Given:     Obs letter signed and copy given.    Kailin Principato 03/13/2024, 2:35 PM

## 2024-03-13 NOTE — TOC Initial Note (Signed)
 Transition of Care [facility]) - Initial/Assessment Note    Patient Details  Name: [name] MRN: [id number] Date of Birth: 01/31/1971  Transition of Care [facility]) CM/SW Contact:    Marval Gell, RN Phone Number: 03/13/2024, 8:14 AM  Clinical Narrative:                  Per chart review.  Patient admitted from home after not going to HD for 1 month (TTS AF).  Pt dc'd 6/13 with HD set up and provided with information for transportation needs and PCP appointment prior to leaving- per chart review of that encounter.  ICM will continue to support emphasizing success with HD will also require compliance on part of patient.   Expected Discharge Plan: Home/Self Care Barriers to Discharge: Continued Medical Work up   Patient Goals and CMS Choice            Expected Discharge Plan and Services       Living arrangements for the past 2 months: Apartment                                      Prior Living Arrangements/Services Living arrangements for the past 2 months: Apartment Lives with:: Spouse                   Activities of Daily Living      Permission Sought/Granted                  Emotional Assessment              Admission diagnosis:  Hypocalcemia [E83.51] Hyperkalemia [E87.5] Uremia [N19] Prolonged Q-T interval on ECG [R94.31] ESRD needing dialysis (HCC) [N18.6, Z99.2] Patient Active Problem List   Diagnosis Date Noted   Generalized weakness 03/13/2024   Hypomagnesemia 03/13/2024   Hypocalcemia 03/13/2024   Elevated troponin 03/13/2024   History of anemia due to chronic kidney disease 03/13/2024   History of essential hypertension 03/13/2024   Thrombocytopenia (HCC) 03/13/2024   Hyperkalemia 03/12/2024   ESRD (end stage renal disease) (HCC) 01/25/2024   Chronic health problem 01/25/2024   Noncompliance with renal dialysis (HCC) 06/10/2023   High anion gap metabolic acidosis 06/09/2023   Uremia 06/09/2023   Protein-calorie  malnutrition, severe 01/30/2019   Pressure injury of skin 01/29/2019   Prolonged Q-T interval on ECG 12/25/2018   Hyponatremia 11/29/2017   Diarrhea 11/29/2017   Heme + stool 03/18/2017   Hyperglycemia without ketosis 02-22-2017   Deceased-donor kidney transplant 02/22/17   Congestive dilated cardiomyopathy (HCC) 07/16/2015   Abnormal cardiovascular function study 07/16/2015   Preventative health care 04/24/2014   Tinea capitis 04/24/2014   Dental infection 10/16/2013   Hyperlipidemia with target low density lipoprotein (LDL) cholesterol less than 100 mg/dL 98/85/7984   Proliferative diabetic retinopathy (HCC) 05/24/2013   Diabetic macular edema (HCC) 05/24/2013   CHF (congestive heart failure) (HCC) 01/20/2012   DM2 (diabetes mellitus, type 2) (HCC) 01/15/2009   Anemia, chronic disease 01/15/2009   Essential hypertension, benign 01/15/2009   Dilated cardiomyopathy (HCC) 01/15/2009   End stage renal disease (HCC) 01/15/2009   Secondary renal hyperparathyroidism (HCC) 01/15/2009   PCP:  Patient, No Pcp Per Pharmacy:   [facility] [place], [place] [facility] [address] [facility] 2[address] [place] [address] Phone: [phone] Fax: [phone]  [facility] [place]), [place] -  [address] [place] [address] Phone: [phone] Fax: [phone]  [facility] [address] [place] [address] Phone: [phone] Fax: [phone]     Social Drivers of Health (SDOH) Social History: SDOH Screenings   Food Insecurity: No Food Insecurity (01/26/2024)  Housing: Low Risk  (01/26/2024)  Transportation Needs: No Transportation Needs (01/26/2024)  Utilities: Not At Risk (01/26/2024)  Depression (PHQ2-9): Low Risk  (04/01/2020)  Financial Resource Strain: Medium Risk (01/27/2018)  Physical Activity: Insufficiently Active (01/27/2018)  Social  Connections: Moderately Integrated (01/27/2018)  Stress: No Stress Concern Present (01/27/2018)  Tobacco Use: Low Risk  (03/12/2024)   SDOH Interventions:     Readmission Risk Interventions     No data to display

## 2024-03-13 NOTE — Care Management Obs Status (Signed)
 Obsletter signed and copy givenMEDICARE OBSERVATION STATUS NOTIFICATION   Patient Details  Name: GENE GLAZEBROOK MRN: [id number] Date of Birth: 1971-08-01   Medicare Observation Status Notification Given:  Yes    Kennley Schwandt 03/13/2024, 2:42 PM

## 2024-03-13 NOTE — Progress Notes (Signed)
 PROGRESS NOTE    HODARI CHUBA  FMW:[id number] DOB: 1971-04-06 DOA: 03/12/2024 PCP: Patient, No Pcp Per  Chief Complaint  Patient presents with   Shortness of Breath    Brief Narrative:   [name] is [name] 53 y.o. male with medical history significant for end-stage renal disease on hemodialysis, type 2 diabetes mellitus, anemia of chronic kidney disease with baseline hemoglobin 7.5-10.5, chronic thrombocytopenia, who is admitted to [facility] on 03/12/2024 with hyperkalemia after presenting from home to [facility] ED complaining of generalized weakness.   Assessment & Plan:   Principal Problem:   Hyperkalemia Active Problems:   DM2 (diabetes mellitus, type 2) (HCC)   Prolonged Q-T interval on ECG   High anion gap metabolic acidosis   ESRD (end stage renal disease) (HCC)   Generalized weakness   Hypomagnesemia   Hypocalcemia   Elevated troponin   History of anemia due to chronic kidney disease   History of essential hypertension   Thrombocytopenia (HCC)  ESRD  hx failed renal transplant  Hyperkalemia  Hypocalcemia  Hyperphosphatemia  AGMA Generalized Weakness  Poor Adherence to Outpatient HD due to lack of Transportation Dialysis per renal, they're working on outpatient transportation [place]  Appears he's still on tacrolimus , currently on hold with relationship to QT prolongation - repeat EKG pending Discharge will be pending final renal recs, needs transportation arranged  Volume Overload  Bilateral Pleural Effusions In setting of above CXR with mild vascular congestion Follow repeat CXR after dialysis, may need thoracentesis, but could see how he does with Ranay Ketter few sessions of dialysis first  Anemia Likely related to his ESRD ESA per renal Trend  Qtc Prolongation Repeat EKG  Thrombocytopenia Noted, follow outpatient  T2DM A1c 7.5 01/2024 SSI  Hypertension Amlodipine   Hypomagnesemia Replace, follow    DVT prophylaxis: SCD Code Status:  full Family Communication: none Disposition:   Status is: Observation The patient remains OBS appropriate and will d/c before 2 midnights.   Consultants:  renal  Procedures:  none  Antimicrobials:  Anti-infectives (From admission, onward)    None       Subjective: No complaints  Objective: Vitals:   03/13/24 1030 03/13/24 1040 03/13/24 1129 03/13/24 1516  BP: (!) 114/91 97/63 116/72 136/70  Pulse: 100 95    Resp: 15 14 16 20   Temp:  (!) 97.5 F (36.4 C) (!) 97.5 F (36.4 C) 98.7 F (37.1 C)  TempSrc:   Oral Oral  SpO2: 99% 99%    Weight:      Height:        Intake/Output Summary (Last 24 hours) at 03/13/2024 1659 Last data filed at 03/13/2024 1040 Gross per 24 hour  Intake 481 ml  Output 1100 ml  Net -619 ml   Filed Weights   03/12/24 1517 03/13/24 0830 03/13/24 0838  Weight: 61.2 kg 61.1 kg 61.1 kg    Examination:  General exam: thin, chronically ill appearing - eating lunch Respiratory system: unlabored Cardiovascular system: RRR Gastrointestinal system: Abdomen is nondistended, soft and nontender. Central nervous system: Alert and oriented. No focal neurological deficits. Tremor. Extremities: no LEE   Data Reviewed: I have personally reviewed following labs and imaging studies  CBC: Recent Labs  Lab 03/12/24 1550 03/13/24 0521  WBC 6.8 6.6  NEUTROABS 4.6 4.3  HGB 7.6* 7.3*  HCT 24.3* 23.1*  MCV 88.7 86.2  PLT 57* 54*    Basic Metabolic Panel: Recent Labs  Lab 03/12/24 1550 03/13/24 0139 03/13/24 0521 03/13/24 0900  NA  141 137 140 136  K 5.7* 5.7* 5.2* 3.6  CL 108 105 108 101  CO2 11* 11* 10* 19*  GLUCOSE 120* 228* 202* 140*  BUN 186* 188* 194* 111*  CREATININE 30.50* 28.09* 29.31* 16.12*  CALCIUM  6.0* 6.2* 6.3* 7.4*  MG 1.5*  --  2.0  --   PHOS  --   --  9.6*  --     GFR: Estimated Creatinine Clearance: 4.5 mL/min (Makinsley Schiavi) (by C-G formula based on SCr of 16.12 mg/dL (H)).  Liver Function Tests: Recent Labs  Lab  03/12/24 1550 03/13/24 0521  AST 22 23  ALT 37 37  ALKPHOS 80 80  BILITOT 0.6 0.6  PROT 7.2 7.0  ALBUMIN 3.5 3.4*    CBG: Recent Labs  Lab 03/13/24 0735 03/13/24 1133 03/13/24 1625  GLUCAP 145* 130* 185*     No results found for this or any previous visit (from the past 240 hours).       Radiology Studies: DG Chest 2 View Result Date: 03/12/2024 CLINICAL DATA:  Shortness of breath.  No dialysis for over Rick Carruthers month. EXAM: CHEST - 2 VIEW COMPARISON:  01/25/2024 FINDINGS: Mild cardiac enlargement. Mild central vascular congestion. No edema. Small right and moderate left pleural effusions. Left lung base opacity likely representing compressive atelectasis. Vascular graft in the left upper mediastinum. Mediastinal contours appear intact. No pneumothorax. IMPRESSION: 1. Cardiac enlargement with mild vascular congestion. 2. Moderate left and small right pleural effusions with basilar atelectasis or infiltration. Electronically Signed   By: Elsie Gravely M.D.   On: 03/12/2024 16:10        Scheduled Meds:  amLODipine   10 mg Oral QHS   calcium  acetate  1,334 mg Oral TID WC   calcium  carbonate  1,250 mg Oral BID WC   Chlorhexidine  Gluconate Cloth  6 each Topical Q0600   darbepoetin (ARANESP ) injection - DIALYSIS  200 mcg Subcutaneous Q Tue-1800   insulin  aspart  0-6 Units Subcutaneous TID WC   sodium bicarbonate   50 mEq Intravenous Once   sodium zirconium cyclosilicate   10 g Oral Once   Continuous Infusions:  calcium  gluconate       LOS: 0 days    Time spent: over 30 min    Meliton Monte, MD [facility]   To contact the attending provider between 7A-7P or the covering provider during after hours 7P-7A, please log into the web site [email or web address] and access using universal [place] password for that web site. If you do not have the password, please call the hospital operator.  03/13/2024, 4:59 PM

## 2024-03-13 NOTE — Progress Notes (Addendum)
 PT Cancellation Note  Patient Details Name: [name] MRN: [id number] DOB: 1970-09-09   Cancelled Treatment:    Reason Eval/Treat Not Completed: Patient at procedure or test/unavailable (HD)  Addendum 1348: Pt declined secondary to fatigue  [name], PT, DPT Acute Rehabilitation Services Office [phone]    [name] 03/13/2024, 10:20 AM

## 2024-03-13 NOTE — Evaluation (Signed)
 Occupational Therapy Evaluation/Discharge Patient Details Name: [name] MRN: [id number] DOB: 10/26/1970 Today's Date: 03/13/2024   History of Present Illness   53 y.o. male with medical history significant for end-stage renal disease on hemodialysis, type 2 diabetes mellitus, anemia of chronic kidney disease with baseline hemoglobin 7.5-10.5, chronic thrombocytopenia, who is admitted to [facility] on 03/12/2024 with hyperkalemia after presenting from home to [facility] ED complaining of generalized weakness.     Clinical Impressions Patient evaluated by Occupational Therapy with no further acute OT needs identified. All education has been completed and the patient has no further questions.  No follow-up Occupational Therapy or equipment needs. OT is signing off. Thank you for this referral.      If plan is discharge home, recommend the following:   Assist for transportation;Assistance with cooking/housework     Functional Status Assessment   Patient has not had a recent decline in their functional status     Equipment Recommendations   None recommended by OT     Recommendations for Other Services   PT consult     Precautions/Restrictions   Precautions Precautions: None Recall of Precautions/Restrictions: Intact Restrictions Weight Bearing Restrictions Per Provider Order: No     Mobility Bed Mobility Overal bed mobility: Modified Independent   Transfers Overall transfer level: Independent Equipment used: None      General transfer comment: Pt completed sit to stand from EOB independently. Able to take several lateral steps to the right towards head of bed.      Balance Overall balance assessment: No apparent balance deficits (not formally assessed)     ADL either performed or assessed with clinical judgement   ADL Overall ADL's : Modified independent;At baseline            Vision Baseline Vision/History: 1 Wears glasses Ability to See  in Adequate Light: 0 Adequate Patient Visual Report: No change from baseline Vision Assessment?: No apparent visual deficits     Perception Perception: Within Functional Limits       Praxis Praxis: WFL       Pertinent Vitals/Pain Pain Assessment Pain Assessment: No/denies pain     Extremity/Trunk Assessment Upper Extremity Assessment Upper Extremity Assessment: Right hand dominant;Overall [facility] for tasks assessed   Lower Extremity Assessment Lower Extremity Assessment: Defer to PT evaluation   Cervical / Trunk Assessment Cervical / Trunk Assessment: Normal   Communication Communication Communication: No apparent difficulties   Cognition Arousal: Alert Behavior During Therapy: WFL for tasks assessed/performed    Following commands: Intact       Cueing  General Comments   Cueing Techniques: Verbal cues  VSS on RA           Home Living Family/patient expects to be discharged to:: Private residence Living Arrangements: Spouse/significant other Available Help at Discharge: Family;Available 24 hours/day Type of Home: Apartment Home Access: Level entry     Home Layout: One level     Bathroom Shower/Tub: Producer, television/film/video: Standard     Home Equipment: Cane - single point   Additional Comments: Pt reports that he is closing on a house and will have the keys 8/1.      Prior Functioning/Environment Prior Level of Function : Independent/Modified Independent;Driving    Mobility Comments: Reports he has been borrowing his wife's cane and started using it 3 weeks ago when he became weak. ADLs Comments: Quit his job.    OT Problem List: Decreased strength  OT Goals(Current goals can be found in the care plan section)   Acute Rehab OT Goals OT Goal Formulation: All assessment and education complete, DC therapy   OT Frequency:       Co-evaluation              AM-PAC OT 6 Clicks Daily Activity     Outcome Measure Help  from another person eating meals?: None Help from another person taking care of personal grooming?: None Help from another person toileting, which includes using toliet, bedpan, or urinal?: None Help from another person bathing (including washing, rinsing, drying)?: None Help from another person to put on and taking off regular upper body clothing?: None Help from another person to put on and taking off regular lower body clothing?: None 6 Click Score: 24   End of Session    Activity Tolerance: Patient tolerated treatment well Patient left: in bed;with call bell/phone within reach;with nursing/sitter in room  OT Visit Diagnosis: Muscle weakness (generalized) (M62.81)                Time: 8548-8484 OT Time Calculation (min): 24 min Charges:  OT General Charges $OT Visit: 1 Visit OT Evaluation $OT Eval Low Complexity: 1 Low OT Treatments $Self Care/Home Management : 8-22 mins  Leita Howell, OTR/L,CBIS  Supplemental OT - [facility] and [facility] Secure Chat Preferred    Marquiz Sotelo, Leita BIRCH 03/13/2024, 8:33 PM

## 2024-03-13 NOTE — Progress Notes (Addendum)
 [facility] Progress Note   Subjective:    Seen and examined patient at bedside. Tolerated today's HD. Remains on RA and denies SOB, CP, and N/V.  Objective Vitals:   03/13/24 1000 03/13/24 1030 03/13/24 1040 03/13/24 1129  BP: 130/87 (!) 114/91 97/63 116/72  Pulse: 100 100 95   Resp: 13 15 14 16   Temp:   (!) 97.5 F (36.4 C) (!) 97.5 F (36.4 C)  TempSrc:    Oral  SpO2: 99% 99% 99%   Weight:      Height:       Physical Exam General: Awake, alert, on RA Heart: S1 and S2; No murmurs, gallops, or rubs Lungs: Clear posteriorly; No wheezing or rales Abdomen: Soft and non-tender Extremities: No LE edema Dialysis Access: AVG   Filed Weights   03/12/24 1517 03/13/24 0830 03/13/24 0838  Weight: 61.2 kg 61.1 kg 61.1 kg    Intake/Output Summary (Last 24 hours) at 03/13/2024 1148 Last data filed at 03/13/2024 1040 Gross per 24 hour  Intake 481 ml  Output 1100 ml  Net -619 ml    Additional Objective Labs: Basic Metabolic Panel: Recent Labs  Lab 03/13/24 0139 03/13/24 0521 03/13/24 0900  NA 137 140 136  K 5.7* 5.2* 3.6  CL 105 108 101  CO2 11* 10* 19*  GLUCOSE 228* 202* 140*  BUN 188* 194* 111*  CREATININE 28.09* 29.31* 16.12*  CALCIUM  6.2* 6.3* 7.4*  PHOS  --  9.6*  --    Liver Function Tests: Recent Labs  Lab 03/12/24 1550 03/13/24 0521  AST 22 23  ALT 37 37  ALKPHOS 80 80  BILITOT 0.6 0.6  PROT 7.2 7.0  ALBUMIN 3.5 3.4*   No results for input(s): LIPASE, AMYLASE in the last 168 hours. CBC: Recent Labs  Lab 03/12/24 1550 03/13/24 0521  WBC 6.8 6.6  NEUTROABS 4.6 4.3  HGB 7.6* 7.3*  HCT 24.3* 23.1*  MCV 88.7 86.2  PLT 57* 54*   Blood Culture    Component Value Date/Time   SDES BLOOD RIGHT ANTECUBITAL 01/29/2019 1855   SPECREQUEST  01/29/2019 1855    BOTTLES DRAWN AEROBIC ONLY Blood Culture adequate volume   CULT  01/29/2019 1855    NO GROWTH 5 DAYS Performed at [facility], [address] [place], [place]  [address]    REPTSTATUS 02/03/2019 FINAL 01/29/2019 1855    Cardiac Enzymes: No results for input(s): CKTOTAL, CKMB, CKMBINDEX, TROPONINI in the last 168 hours. CBG: Recent Labs  Lab 03/13/24 0735 03/13/24 1133  GLUCAP 145* 130*   Iron Studies:  Recent Labs    03/13/24 0521  IRON 107  TIBC 199*  FERRITIN 825*   Lab Results  Component Value Date   INR 1.1 01/01/2019   INR 1.1 12/31/2018   INR 1.2 12/26/2018   Studies/Results: DG Chest 2 View Result Date: 03/12/2024 CLINICAL DATA:  Shortness of breath.  No dialysis for over a month. EXAM: CHEST - 2 VIEW COMPARISON:  01/25/2024 FINDINGS: Mild cardiac enlargement. Mild central vascular congestion. No edema. Small right and moderate left pleural effusions. Left lung base opacity likely representing compressive atelectasis. Vascular graft in the left upper mediastinum. Mediastinal contours appear intact. No pneumothorax. IMPRESSION: 1. Cardiac enlargement with mild vascular congestion. 2. Moderate left and small right pleural effusions with basilar atelectasis or infiltration. Electronically Signed   By: Elsie Gravely M.D.   On: 03/12/2024 16:10    Medications:  calcium  gluconate  amLODipine   10 mg Oral QHS   calcium  carbonate  1,250 mg Oral BID WC   Chlorhexidine  Gluconate Cloth  6 each Topical Q0600   insulin  aspart  0-6 Units Subcutaneous TID WC   sodium bicarbonate   50 mEq Intravenous Once   sodium zirconium cyclosilicate   10 g Oral Once    Dialysis Orders: [facility] T,Th,S 4 hrs 180NRe 400/600 66.9 kg 2.0 K/ 3.0 Ca AVG Heparin  2500 units IV three times per week  Assessment/Plan: # ESRD on HD, nonadherence with outpatient treatment with azotemia/shortness of breath: The BUN is very high and it seems like his last dialysis was more than a month ago (02/02/24).  We will plan to do dialysis at a slow start, AVG for the access.  Tolerated today's HD. Next HD likely 7/31 and will continue titrating up settings slowly.  Reached out to our renal navigator to look into outpatient transportation to his dialysis unit.   # Hyperkalemia: Received medical treatment in the ER plan for dialysis. K+ today 3.6 and he received HD.   # Hypertension/volume: UF with HD, resume home medication.   # Anemia of ESRD: Hgb now 7.3. Iron studies include: Fe 107, Tsat 54%, and ferritin 825. Will start ESA here.   # Metabolic Bone Disease: Phos is very high. Resuming Calcium  acetate 2 tabs with meals. Continue Os-Cal for hypocalcemia.   Alexis Piety, NP [facility] 03/13/2024,11:48 AM  LOS: 0 days

## 2024-03-13 NOTE — Progress Notes (Signed)
 Pt is supposed to receive out-pt HD at [facility] on TTS 12:00 pm chair time. Pt has not been compliant with HD apps and sites transportation as the issue. Contacted [facility] clinic social worker to inquire if any applications had been completed by clinic staff for transportation services. Navigator advised that [facility] app was completed on 02/02/24. Clinic social worker reports that pt was contacted by [employer] [facility] regarding needed corrections to application before pt's application could be processed. Met with pt at bedside. Introduced self and explained role. Pt informed navigator that he made corrections to application and sent back to [employer] [facility] for review. Navigator contacted [facility] and was advised that corrections have not been received and pt's application cannot be processed until corrections received. Contacted [facility] clinic social worker to inquire if there a way corrections can be made to pt's application while pt hospitalized. In addition to the above, pt's clinic advised navigator that pt plans to move to a new address in August ([address], [place], [place] [address]). Pt confirms he will move to this address on Aug 1. Inquired of pt if he would be interested in a clinic closer to that address. Pt agreeable to a closer clinic to new address if a new clinic will accept. Navigator reached out to [facility] and [facility] [place] [facility] and advised that neither clinic is able to accept pt at this time. [facility] plans to place pt on waiting list for when something opens in the future. [facility] willing to evaluate pt in the future if pt able to show compliance to out-pt HD appts at d/c. This info was provided to nephrologist and renal NP. At this time, appears pt will need to resume at [facility] at d/c. Will assist as needed.   Randine Mungo Dialysis Navigator [phone]

## 2024-03-13 NOTE — Progress Notes (Signed)
 OT Cancellation Note  Patient Details Name: [name] MRN: [id number] DOB: 1970-12-24   Cancelled Treatment:    Reason Eval/Treat Not Completed: Patient at procedure or test/ unavailable Pt currently off unit for HD. Unable to complete skilled OT eval at this time. Will follow up with pt at a later time when able to complete.   Leita Howell, OTR/L,CBIS  Supplemental OT - [facility] Secure Chat Preferred   03/13/2024, 10:24 AM

## 2024-03-13 NOTE — Procedures (Signed)
 I was present at this dialysis session. I have reviewed the session itself and made appropriate changes.   Filed Weights   03/12/24 1517 03/13/24 0830 03/13/24 0838  Weight: 61.2 kg 61.1 kg 61.1 kg    Recent Labs  Lab 03/13/24 0521 03/13/24 0900  NA 140 136  K 5.2* 3.6  CL 108 101  CO2 10* 19*  GLUCOSE 202* 140*  BUN 194* 111*  CREATININE 29.31* 16.12*  CALCIUM  6.3* 7.4*  PHOS 9.6*  --     Recent Labs  Lab 03/12/24 1550 03/13/24 0521  WBC 6.8 6.6  NEUTROABS 4.6 4.3  HGB 7.6* 7.3*  HCT 24.3* 23.1*  MCV 88.7 86.2  PLT 57* 54*    Scheduled Meds:  amLODipine   10 mg Oral QHS   calcium  carbonate  1,250 mg Oral BID WC   Chlorhexidine  Gluconate Cloth  6 each Topical Q0600   insulin  aspart  0-6 Units Subcutaneous TID WC   sodium bicarbonate   50 mEq Intravenous Once   sodium zirconium cyclosilicate   10 g Oral Once   Continuous Infusions:  calcium  gluconate     PRN Meds:.acetaminophen  **OR** acetaminophen , melatonin, ondansetron  (ZOFRAN ) IV   Jayson Player,  MD 03/13/2024, 10:49 AM

## 2024-03-14 ENCOUNTER — Observation Stay (HOSPITAL_COMMUNITY)

## 2024-03-14 DIAGNOSIS — R531 Weakness: Secondary | ICD-10-CM | POA: Diagnosis not present

## 2024-03-14 DIAGNOSIS — E875 Hyperkalemia: Secondary | ICD-10-CM | POA: Diagnosis not present

## 2024-03-14 DIAGNOSIS — E119 Type 2 diabetes mellitus without complications: Secondary | ICD-10-CM | POA: Diagnosis not present

## 2024-03-14 DIAGNOSIS — N186 End stage renal disease: Secondary | ICD-10-CM | POA: Diagnosis not present

## 2024-03-14 LAB — CBC WITH DIFFERENTIAL/PLATELET
Abs Granulocyte: 3.9 K/uL (ref 1.5–6.5)
Abs Immature Granulocytes: 0.03 K/uL (ref 0.00–0.07)
Basophils Absolute: 0 K/uL (ref 0.0–0.1)
Basophils Relative: 0 %
Eosinophils Absolute: 0.4 K/uL (ref 0.0–0.5)
Eosinophils Relative: 6 %
HCT: 22.3 % — ABNORMAL LOW (ref 39.0–52.0)
Hemoglobin: 7.5 g/dL — ABNORMAL LOW (ref 13.0–17.0)
Immature Granulocytes: 0 %
Lymphocytes Relative: 25 %
Lymphs Abs: 1.7 K/uL (ref 0.7–4.0)
MCH: 28 pg (ref 26.0–34.0)
MCHC: 33.6 g/dL (ref 30.0–36.0)
MCV: 83.2 fL (ref 80.0–100.0)
Monocytes Absolute: 0.7 K/uL (ref 0.1–1.0)
Monocytes Relative: 11 %
Neutro Abs: 3.9 K/uL (ref 1.7–7.7)
Neutrophils Relative %: 58 %
Platelets: 50 K/uL — ABNORMAL LOW (ref 150–400)
RBC: 2.68 MIL/uL — ABNORMAL LOW (ref 4.22–5.81)
RDW: 16 % — ABNORMAL HIGH (ref 11.5–15.5)
WBC: 6.7 K/uL (ref 4.0–10.5)
nRBC: 0 % (ref 0.0–0.2)

## 2024-03-14 LAB — GLUCOSE, CAPILLARY
Glucose-Capillary: 138 mg/dL — ABNORMAL HIGH (ref 70–99)
Glucose-Capillary: 174 mg/dL — ABNORMAL HIGH (ref 70–99)
Glucose-Capillary: 111 mg/dL — ABNORMAL HIGH (ref 70–99)

## 2024-03-14 LAB — RENAL FUNCTION PANEL
Albumin: 3 g/dL — ABNORMAL LOW (ref 3.5–5.0)
Anion gap: 14 (ref 5–15)
BUN: 109 mg/dL — ABNORMAL HIGH (ref 6–20)
CO2: 21 mmol/L — ABNORMAL LOW (ref 22–32)
Calcium: 6.8 mg/dL — ABNORMAL LOW (ref 8.9–10.3)
Chloride: 101 mmol/L (ref 98–111)
Creatinine, Ser: 19.17 mg/dL — ABNORMAL HIGH (ref 0.61–1.24)
GFR, Estimated: 3 mL/min — ABNORMAL LOW (ref >60)
Glucose, Bld: 142 mg/dL — ABNORMAL HIGH (ref 70–99)
Phosphorus: 5.9 mg/dL — ABNORMAL HIGH (ref 2.5–4.6)
Potassium: 3.8 mmol/L (ref 3.5–5.1)
Sodium: 136 mmol/L (ref 135–145)

## 2024-03-14 LAB — HEPATITIS B SURFACE ANTIBODY, QUANTITATIVE: Hep B S AB Quant (Post): 7.2 m[IU]/mL — ABNORMAL LOW

## 2024-03-14 LAB — PARATHYROID HORMONE, INTACT (NO CA): PTH: 120 pg/mL — ABNORMAL HIGH (ref 15–65)

## 2024-03-14 MED ORDER — DAPSONE 25 MG PO TABS
25.0000 mg | ORAL_TABLET | Freq: Every day | ORAL | Status: DC
  Administered 2024-03-15: 25 mg via ORAL
  Filled 2024-03-14: qty 1

## 2024-03-14 MED ORDER — CHLORHEXIDINE GLUCONATE CLOTH 2 % EX PADS
6.0000 | MEDICATED_PAD | Freq: Every day | CUTANEOUS | Status: DC
  Administered 2024-03-15: 6 via TOPICAL

## 2024-03-14 MED ORDER — GUAIFENESIN 100 MG/5ML PO LIQD
5.0000 mL | Freq: Once | ORAL | Status: AC
  Administered 2024-03-14: 5 mL via ORAL
  Filled 2024-03-14: qty 5

## 2024-03-14 NOTE — Progress Notes (Signed)
 [facility] Progress Note   Subjective:    Seen and examined patient at bedside. Denies any acute complaints. Remains on RA. BUN still high which is expected since he missed over month of HD. Plan to dialyze again later this afternoon or tonight.  Objective Vitals:   03/14/24 0400 03/14/24 0733 03/14/24 0800 03/14/24 1149  BP: 113/75 126/71 124/72 134/68  Pulse: (!) 102 (!) 105 (!) 105 (!) 110  Resp: 14  19   Temp: 98.4 F (36.9 C) 98.3 F (36.8 C)  98.1 F (36.7 C)  TempSrc: Oral Oral  Oral  SpO2: 92% 95% 94% 95%  Weight: 64.3 kg     Height:       Physical Exam General: Awake, alert, on RA Heart: S1 and S2; No murmurs, gallops, or rubs Lungs: Clear posteriorly; No wheezing or rales Abdomen: Soft and non-tender Extremities: No LE edema Dialysis Access: AVG   Filed Weights   03/13/24 0830 03/13/24 0838 03/14/24 0400  Weight: 61.1 kg 61.1 kg 64.3 kg   No intake or output data in the 24 hours ending 03/14/24 1312  Additional Objective Labs: Basic Metabolic Panel: Recent Labs  Lab 03/13/24 0521 03/13/24 0900 03/14/24 0502  NA 140 136 136  K 5.2* 3.6 3.8  CL 108 101 101  CO2 10* 19* 21*  GLUCOSE 202* 140* 142*  BUN 194* 111* 109*  CREATININE 29.31* 16.12* 19.17*  CALCIUM  6.3* 7.4* 6.8*  PHOS 9.6*  --  5.9*   Liver Function Tests: Recent Labs  Lab 03/12/24 1550 03/13/24 0521 03/14/24 0502  AST 22 23  --   ALT 37 37  --   ALKPHOS 80 80  --   BILITOT 0.6 0.6  --   PROT 7.2 7.0  --   ALBUMIN 3.5 3.4* 3.0*   No results for input(s): LIPASE, AMYLASE in the last 168 hours. CBC: Recent Labs  Lab 03/12/24 1550 03/13/24 0521 03/14/24 0502  WBC 6.8 6.6 6.7  NEUTROABS 4.6 4.3 3.9  HGB 7.6* 7.3* 7.5*  HCT 24.3* 23.1* 22.3*  MCV 88.7 86.2 83.2  PLT 57* 54* 50*   Blood Culture    Component Value Date/Time   SDES BLOOD RIGHT ANTECUBITAL 01/29/2019 1855   SPECREQUEST  01/29/2019 1855    BOTTLES DRAWN AEROBIC ONLY Blood Culture  adequate volume   CULT  01/29/2019 1855    NO GROWTH 5 DAYS Performed at [facility], [address] [place], [place] [address]    REPTSTATUS 02/03/2019 FINAL 01/29/2019 1855    Cardiac Enzymes: No results for input(s): CKTOTAL, CKMB, CKMBINDEX, TROPONINI in the last 168 hours. CBG: Recent Labs  Lab 03/13/24 1133 03/13/24 1625 03/13/24 2114 03/14/24 0735 03/14/24 1151  GLUCAP 130* 185* 126* 138* 174*   Iron Studies:  Recent Labs    03/13/24 0521  IRON 107  TIBC 199*  FERRITIN 825*   Lab Results  Component Value Date   INR 1.1 01/01/2019   INR 1.1 12/31/2018   INR 1.2 12/26/2018   Studies/Results: DG CHEST PORT 1 VIEW Result Date: 03/14/2024 CLINICAL DATA:  Pleural effusion. EXAM: PORTABLE CHEST 1 VIEW COMPARISON:  03/12/2024. FINDINGS: Low lung volume.  Mild diffuse vascular congestion. Re-demonstration of left retrocardiac airspace opacity obscuring the left hemidiaphragm, descending thoracic aorta and blunting the left lateral costophrenic angle, suggesting combination of left lung atelectasis and/or consolidation with pleural effusion. No significant interval change. Bilateral lung fields are otherwise clear. Right lateral costophrenic angle is clear. Stable cardio-mediastinal  silhouette. Vascular stent noted overlying the left upper mediastinum, unchanged. No acute osseous abnormalities. The soft tissues are within normal limits. IMPRESSION: No significant interval change since the prior study. Persistent left retrocardiac opacity, as described above. Electronically Signed   By: Ree Molt M.D.   On: 03/14/2024 08:38   DG Chest 2 View Result Date: 03/12/2024 CLINICAL DATA:  Shortness of breath.  No dialysis for over a month. EXAM: CHEST - 2 VIEW COMPARISON:  01/25/2024 FINDINGS: Mild cardiac enlargement. Mild central vascular congestion. No edema. Small right and moderate left pleural effusions. Left lung base opacity likely representing compressive  atelectasis. Vascular graft in the left upper mediastinum. Mediastinal contours appear intact. No pneumothorax. IMPRESSION: 1. Cardiac enlargement with mild vascular congestion. 2. Moderate left and small right pleural effusions with basilar atelectasis or infiltration. Electronically Signed   By: Elsie Gravely M.D.   On: 03/12/2024 16:10    Medications:  calcium  gluconate      amLODipine   10 mg Oral QHS   calcium  acetate  1,334 mg Oral TID WC   calcium  carbonate  1,250 mg Oral BID WC   [START ON 03/15/2024] Chlorhexidine  Gluconate Cloth  6 each Topical Q0600   darbepoetin (ARANESP ) injection - DIALYSIS  200 mcg Subcutaneous Q Tue-1800   insulin  aspart  0-6 Units Subcutaneous TID WC   sodium bicarbonate   50 mEq Intravenous Once   sodium zirconium cyclosilicate   10 g Oral Once    Dialysis Orders: [facility] T,Th,S 4 hrs 180NRe 400/600 66.9 kg 2.0 K/ 3.0 Ca AVG Heparin  2500 units IV three times per week  Assessment/Plan: # ESRD on HD, nonadherence with outpatient treatment with azotemia/shortness of breath: The BUN is very high and it seems like his last dialysis was more than a month ago (02/02/24).  We will plan to do dialysis at a slow start, AVG for the access. Received HD 7/29 (2hrs) and plan for HD again either later this afternoon or tonight. Continue titrating up settings slowly. Reached out to our renal navigator to look into outpatient transportation to his dialysis unit.   # Hyperkalemia: Received medical treatment in the ER plan for dialysis. K+ today is 3.8.   # Hypertension/volume: UF with HD, resume home medication.   # Anemia of ESRD: Hgb now 7.5. Iron studies include: Fe 107, Tsat 54%, and ferritin 825. Started ESA and dose given on 7/29.   # Metabolic Bone Disease: Resumed Calcium  acetate 2 tabs with meals. Phos almost to goal. Corr Ca 7.6, continue Os-Cal BID.  Charmaine Piety, NP [facility] 03/14/2024,1:12 PM  LOS: 0 days

## 2024-03-14 NOTE — Plan of Care (Signed)

## 2024-03-14 NOTE — Progress Notes (Signed)
 PROGRESS NOTE        PATIENT DETAILS Name: [name] Age: 53 y.o. Sex: male Date of Birth: [birth year] Admit Date: 03/12/2024 Admitting Physician Eva KATHEE Pore, DO ERE:[id number], No Pcp Per  Brief Summary: Patient is a 53 y.o.  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  Significant events: 7/28>> admit to [facility]  Significant studies: 7/28>> CXR: Moderate left/small right pleural effusion.  Mild vascular congestion.  Significant microbiology data: None  Procedures: None  Consults: Nephrology  Subjective: Lying comfortably in bed-denies any chest pain or shortness of breath.  Lying comfortably in bed.  Objective: Vitals: Blood pressure 124/72, pulse (!) 105, temperature 98.3 F (36.8 C), temperature source Oral, resp. rate 19, height 5' 5 (1.651 m), weight 64.3 kg, SpO2 94%.   Exam: Gen Exam:Alert awake-not in any distress HEENT:atraumatic, normocephalic Chest: B/L clear to auscultation anteriorly CVS:S1S2 regular Abdomen:soft non tender, non distended Extremities:no edema Neurology: Non focal Skin: no rash  Pertinent Labs/Radiology:    Latest Ref Rng & Units 03/14/2024    5:02 AM 03/13/2024    5:21 AM 03/12/2024    3:50 PM  CBC  WBC 4.0 - 10.5 K/uL 6.7  6.6  6.8   Hemoglobin 13.0 - 17.0 g/dL 7.5  7.3  7.6   Hematocrit 39.0 - 52.0 % 22.3  23.1  24.3   Platelets 150 - 400 K/uL 50  54  57     Lab Results  Component Value Date   NA 136 03/14/2024   K 3.8 03/14/2024   CL 101 03/14/2024   CO2 21 (L) 03/14/2024    Assessment/Plan: Volume overload with hyperkalemia/metabolic acidosis/hyperphosphatemia secondary to noncompliance with HD (history of failed renal transplantation) Nephrology following-underwent HD 7/29-K stable but remains with volume overload-BUN remains>100 Next HD  session planned on 7/31 Tacrolimus  on hold due to QTc prolongation-repeat twelve-lead EKG tomorrow. Renal navigator working on transportation issues.  B/L pleural effusions Secondary to volume overload Should improve with further HD Currently on room air and essentially asymptomatic  Normocytic anemia Secondary to ESRD-noncompliance ESA/iron per nephrology service  Thrombocytopenia Unclear etiology Chronic issue Close to baseline Monitor periodically.  QTc prolongation Repeat twelve-lead EKG in a.m.  HTN BP stable Continue amlodipine   DM-2 (A1c 7.5 on 6/20) CBG stable SSI  Recent Labs    03/13/24 1625 03/13/24 2114 03/14/24 0735  GLUCAP 185* 126* 138*     Code status:   Code Status: Full Code   DVT Prophylaxis: SCDs Start: 03/12/24 1930   Family Communication: None at bedside   Disposition Plan: Status is: Observation The patient will require care spanning > 2 midnights and should be moved to inpatient because: Severity of illness   Planned Discharge Destination:Home   Diet: Diet Order             Diet renal with fluid restriction Fluid restriction: 2000 mL Fluid; Room service appropriate? Yes; Fluid consistency: Thin  Diet effective now                     Antimicrobial agents: Anti-infectives (From admission, onward)    None        MEDICATIONS: Scheduled Meds:  amLODipine   10 mg Oral QHS   calcium  acetate  1,334 mg Oral TID WC  calcium  carbonate  1,250 mg Oral BID WC   Chlorhexidine  Gluconate Cloth  6 each Topical Q0600   darbepoetin (ARANESP ) injection - DIALYSIS  200 mcg Subcutaneous Q Tue-1800   insulin  aspart  0-6 Units Subcutaneous TID WC   sodium bicarbonate   50 mEq Intravenous Once   sodium zirconium cyclosilicate   10 g Oral Once   Continuous Infusions:  calcium  gluconate     PRN Meds:.acetaminophen  **OR** acetaminophen , melatonin, ondansetron  (ZOFRAN ) IV   I have personally reviewed following labs and imaging  studies  LABORATORY DATA: CBC: Recent Labs  Lab 03/12/24 1550 03/13/24 0521 03/14/24 0502  WBC 6.8 6.6 6.7  NEUTROABS 4.6 4.3 3.9  HGB 7.6* 7.3* 7.5*  HCT 24.3* 23.1* 22.3*  MCV 88.7 86.2 83.2  PLT 57* 54* 50*    Basic Metabolic Panel: Recent Labs  Lab 03/12/24 1550 03/13/24 0139 03/13/24 0521 03/13/24 0900 03/14/24 0502  NA 141 137 140 136 136  K 5.7* 5.7* 5.2* 3.6 3.8  CL 108 105 108 101 101  CO2 11* 11* 10* 19* 21*  GLUCOSE 120* 228* 202* 140* 142*  BUN 186* 188* 194* 111* 109*  CREATININE 30.50* 28.09* 29.31* 16.12* 19.17*  CALCIUM  6.0* 6.2* 6.3* 7.4* 6.8*  MG 1.5*  --  2.0  --   --   PHOS  --   --  9.6*  --  5.9*    GFR: Estimated Creatinine Clearance: 3.8 mL/min (A) (by C-G formula based on SCr of 19.17 mg/dL (H)).  Liver Function Tests: Recent Labs  Lab 03/12/24 1550 03/13/24 0521 03/14/24 0502  AST 22 23  --   ALT 37 37  --   ALKPHOS 80 80  --   BILITOT 0.6 0.6  --   PROT 7.2 7.0  --   ALBUMIN 3.5 3.4* 3.0*   No results for input(s): LIPASE, AMYLASE in the last 168 hours. No results for input(s): AMMONIA in the last 168 hours.  Coagulation Profile: No results for input(s): INR, PROTIME in the last 168 hours.  Cardiac Enzymes: No results for input(s): CKTOTAL, CKMB, CKMBINDEX, TROPONINI in the last 168 hours.  BNP (last 3 results) No results for input(s): PROBNP in the last 8760 hours.  Lipid Profile: No results for input(s): CHOL, HDL, LDLCALC, TRIG, CHOLHDL, LDLDIRECT in the last 72 hours.  Thyroid  Function Tests: No results for input(s): TSH, T4TOTAL, FREET4, T3FREE, THYROIDAB in the last 72 hours.  Anemia Panel: Recent Labs    03/13/24 0521  FERRITIN 825*  TIBC 199*  IRON 107    Urine analysis:    Component Value Date/Time   COLORURINE YELLOW 01/25/2024 1546   APPEARANCEUR HAZY (A) 01/25/2024 1546   LABSPEC 1.013 01/25/2024 1546   PHURINE 5.0 01/25/2024 1546   GLUCOSEU 150 (A)  01/25/2024 1546   HGBUR MODERATE (A) 01/25/2024 1546   BILIRUBINUR NEGATIVE 01/25/2024 1546   KETONESUR NEGATIVE 01/25/2024 1546   PROTEINUR >=300 (A) 01/25/2024 1546   UROBILINOGEN 0.2 12/23/2008 1845   NITRITE NEGATIVE 01/25/2024 1546   LEUKOCYTESUR NEGATIVE 01/25/2024 1546    Sepsis Labs: Lactic Acid, Venous    Component Value Date/Time   LATICACIDVEN 1.1 01/29/2019 1855    MICROBIOLOGY: No results found for this or any previous visit (from the past 240 hours).  RADIOLOGY STUDIES/RESULTS: DG CHEST PORT 1 VIEW Result Date: 03/14/2024 CLINICAL DATA:  Pleural effusion. EXAM: PORTABLE CHEST 1 VIEW COMPARISON:  03/12/2024. FINDINGS: Low lung volume.  Mild diffuse vascular congestion. Re-demonstration of left retrocardiac airspace opacity obscuring  the left hemidiaphragm, descending thoracic aorta and blunting the left lateral costophrenic angle, suggesting combination of left lung atelectasis and/or consolidation with pleural effusion. No significant interval change. Bilateral lung fields are otherwise clear. Right lateral costophrenic angle is clear. Stable cardio-mediastinal silhouette. Vascular stent noted overlying the left upper mediastinum, unchanged. No acute osseous abnormalities. The soft tissues are within normal limits. IMPRESSION: No significant interval change since the prior study. Persistent left retrocardiac opacity, as described above. Electronically Signed   By: Ree Molt M.D.   On: 03/14/2024 08:38   DG Chest 2 View Result Date: 03/12/2024 CLINICAL DATA:  Shortness of breath.  No dialysis for over a month. EXAM: CHEST - 2 VIEW COMPARISON:  01/25/2024 FINDINGS: Mild cardiac enlargement. Mild central vascular congestion. No edema. Small right and moderate left pleural effusions. Left lung base opacity likely representing compressive atelectasis. Vascular graft in the left upper mediastinum. Mediastinal contours appear intact. No pneumothorax. IMPRESSION: 1. Cardiac  enlargement with mild vascular congestion. 2. Moderate left and small right pleural effusions with basilar atelectasis or infiltration. Electronically Signed   By: Elsie Gravely M.D.   On: 03/12/2024 16:10     LOS: 0 days   Donalda Applebaum, MD  [facility]    To contact the attending provider between 7A-7P or the covering provider during after hours 7P-7A, please log into the web site [email or web address] and access using universal [place] password for that web site. If you do not have the password, please call the hospital operator.  03/14/2024, 11:14 AM

## 2024-03-14 NOTE — Progress Notes (Signed)
 With assistance of out-pt HD clinic social worker, pt was able to make correction to [facility] app and app was faxed to [facility] yesterday for review. Per [facility] staff today, pt has been pre-certified for transportation services and can start using services at d/c. Met with pt at bedside to make him aware of this info and that he will need to call and make reservations for HD appts. Pt provided phone number to [facility] to make reservations. Pt also advised that he will need to resume at [facility] on TTS 12:00 at d/c. Pt reminded that compliance for HD treatments is very important when being considered for transfer request for a new clinic due to new home address. Pt voices understanding of all information. Update provided to nephrologist and renal NP. Update provided to [facility] staff as well. Will assist as needed.   Randine Mungo Dialysis Navigator [phone]

## 2024-03-14 NOTE — Evaluation (Signed)
 Physical Therapy Brief Evaluation and Discharge Note Patient Details Name: [name] MRN: [id number] DOB: 1971/02/15 Today's Date: 03/14/2024   History of Present Illness  53 y.o. male with medical history significant for end-stage renal disease on hemodialysis, type 2 diabetes mellitus, anemia of chronic kidney disease with baseline hemoglobin 7.5-10.5, chronic thrombocytopenia, who is admitted to [facility] on 03/12/2024 with hyperkalemia after presenting from home to [facility] ED complaining of generalized weakness.  Clinical Impression  Pt presents with admitting diagnosis above. Pt today was able to ambulate in hallway mostly with supervision and navigate stairs independently. PTA pt was fully independent. Pt presents at or near baseline mobility. Pt has no further acute PT needs and will be signing off. Re consult PT if mobility status changes. Pt would benefit from continued mobility with mobility specialist during acute stay.        PT Assessment Patient does not need any further PT services  Assistance Needed at Discharge  PRN    Equipment Recommendations None recommended by PT  Recommendations for Other Services       Precautions/Restrictions Precautions Precautions: None Recall of Precautions/Restrictions: Intact Restrictions Weight Bearing Restrictions Per Provider Order: No        Mobility  Bed Mobility   Supine/Sidelying to sit: Independent Sit to supine/sidelying: Independent    Transfers Overall transfer level: Independent Equipment used: None                    Ambulation/Gait Ambulation/Gait assistance: Supervision, Contact guard assist Gait Distance (Feet): 250 Feet Assistive device: None Gait Pattern/deviations: Step-through pattern, Decreased stride length Gait Speed: Pace WFL General Gait Details: Occasional scissoring gait noted however pt able to self correct.  Home Activity Instructions    Stairs Stairs: Yes Stairs  assistance: Independent Stair Management: One rail Right, Step to pattern, Forwards Number of Stairs: 2 General stair comments: no LOB noted.  Modified Rankin (Stroke Patients Only)        Balance Overall balance assessment: Independent                        Pertinent Vitals/Pain PT - Brief Vital Signs All Vital Signs Stable: Yes Pain Assessment Pain Assessment: No/denies pain     Home Living Family/patient expects to be discharged to:: Private residence Living Arrangements: Spouse/significant other Available Help at Discharge: Family;Available 24 hours/day Home Environment: Stairs to enter;Rail - right;Rail - left  Stairs-Number of Steps: 2 Home Equipment: Cane - single point   Additional Comments: Pt reports that he is closing on a house and will have the keys 8/1.    Prior Function Level of Independence: Independent      UE/LE Assessment   UE ROM/Strength/Tone/Coordination: WFL    LE ROM/Strength/Tone/Coordination: [facility]      Communication   Communication Communication: No apparent difficulties     Cognition Overall Cognitive Status: Appears within functional limits for tasks assessed/performed       General Comments General comments (skin integrity, edema, etc.): VSS on RA    Exercises     Assessment/Plan    PT Problem List         PT Visit Diagnosis Other abnormalities of gait and mobility (R26.89)    No Skilled PT Patient at baseline level of functioning;Patient is independent with all acitivity/mobility   Co-evaluation                AMPAC 6 Clicks Help needed turning from your back  to your side while in a flat bed without using bedrails?: None Help needed moving from lying on your back to sitting on the side of a flat bed without using bedrails?: None Help needed moving to and from a bed to a chair (including a wheelchair)?: None Help needed standing up from a chair using your arms (e.g., wheelchair or bedside chair)?:  None Help needed to walk in hospital room?: None Help needed climbing 3-5 steps with a railing? : None 6 Click Score: 24      End of Session Equipment Utilized During Treatment: Gait belt Activity Tolerance: Patient tolerated treatment well Patient left: in bed;with call bell/phone within reach Nurse Communication: Mobility status PT Visit Diagnosis: Other abnormalities of gait and mobility (R26.89)     Time: 0950-1007 PT Time Calculation (min) (ACUTE ONLY): 17 min  Charges:   PT Evaluation $PT Eval Moderate Complexity: 1 Mod      Asmi Fugere B, PT, DPT Acute Rehab Services [phone]   Larita Deremer  03/14/2024, 11:10 AM

## 2024-03-15 ENCOUNTER — Other Ambulatory Visit (HOSPITAL_COMMUNITY): Payer: Self-pay

## 2024-03-15 DIAGNOSIS — E1122 Type 2 diabetes mellitus with diabetic chronic kidney disease: Secondary | ICD-10-CM | POA: Diagnosis not present

## 2024-03-15 DIAGNOSIS — N186 End stage renal disease: Secondary | ICD-10-CM | POA: Diagnosis not present

## 2024-03-15 DIAGNOSIS — R531 Weakness: Secondary | ICD-10-CM | POA: Diagnosis not present

## 2024-03-15 DIAGNOSIS — E119 Type 2 diabetes mellitus without complications: Secondary | ICD-10-CM | POA: Diagnosis not present

## 2024-03-15 DIAGNOSIS — E875 Hyperkalemia: Secondary | ICD-10-CM | POA: Diagnosis not present

## 2024-03-15 LAB — CBC
HCT: 25.3 % — ABNORMAL LOW (ref 39.0–52.0)
Hemoglobin: 8.2 g/dL — ABNORMAL LOW (ref 13.0–17.0)
MCH: 27.5 pg (ref 26.0–34.0)
MCHC: 32.4 g/dL (ref 30.0–36.0)
MCV: 84.9 fL (ref 80.0–100.0)
Platelets: 50 K/uL — ABNORMAL LOW (ref 150–400)
RBC: 2.98 MIL/uL — ABNORMAL LOW (ref 4.22–5.81)
RDW: 15.9 % — ABNORMAL HIGH (ref 11.5–15.5)
WBC: 6.7 K/uL (ref 4.0–10.5)
nRBC: 0 % (ref 0.0–0.2)

## 2024-03-15 LAB — GLUCOSE, CAPILLARY: Glucose-Capillary: 140 mg/dL — ABNORMAL HIGH (ref 70–99)

## 2024-03-15 LAB — RENAL FUNCTION PANEL
Albumin: 3.1 g/dL — ABNORMAL LOW (ref 3.5–5.0)
Anion gap: 13 (ref 5–15)
BUN: 74 mg/dL — ABNORMAL HIGH (ref 6–20)
CO2: 24 mmol/L (ref 22–32)
Calcium: 7.1 mg/dL — ABNORMAL LOW (ref 8.9–10.3)
Chloride: 99 mmol/L (ref 98–111)
Creatinine, Ser: 14.59 mg/dL — ABNORMAL HIGH (ref 0.61–1.24)
GFR, Estimated: 4 mL/min — ABNORMAL LOW (ref >60)
Glucose, Bld: 128 mg/dL — ABNORMAL HIGH (ref 70–99)
Phosphorus: 4.8 mg/dL — ABNORMAL HIGH (ref 2.5–4.6)
Potassium: 3.8 mmol/L (ref 3.5–5.1)
Sodium: 136 mmol/L (ref 135–145)

## 2024-03-15 MED ORDER — OMEPRAZOLE 20 MG PO CPDR
20.0000 mg | DELAYED_RELEASE_CAPSULE | Freq: Every day | ORAL | 0 refills | Status: AC
  Filled 2024-03-15: qty 30, 30d supply, fill #0

## 2024-03-15 MED ORDER — TACROLIMUS 1 MG PO CAPS
1.0000 mg | ORAL_CAPSULE | Freq: Two times a day (BID) | ORAL | 1 refills | Status: AC
  Filled 2024-03-15 – 2024-04-25 (×3): qty 60, 30d supply, fill #0

## 2024-03-15 MED ORDER — DAPSONE 25 MG PO TABS
25.0000 mg | ORAL_TABLET | Freq: Every day | ORAL | 0 refills | Status: AC
  Filled 2024-03-15: qty 30, 30d supply, fill #0

## 2024-03-15 MED ORDER — CALCIUM ACETATE (PHOS BINDER) 667 MG PO CAPS
1334.0000 mg | ORAL_CAPSULE | Freq: Three times a day (TID) | ORAL | 0 refills | Status: AC
  Filled 2024-03-15: qty 180, 30d supply, fill #0

## 2024-03-15 NOTE — Discharge Planning (Signed)
 [facility] Patient Discharge Orders- [facility] CLINIC: [facility]  Patient's name: [name] Admit/DC Dates: 03/12/2024 - 03/15/2024  Discharge Diagnoses: Volume overload-missed HD for over a month, transportation issues-read renal navigator's note   B/l pleural effusions-2nd missed HD. Should improve with consistent HD Thrombocytopenia -chronic Hypocalcemia -chronic. On TUMS, calcium  acetate, and hectorol   Aranesp : Given: Yes    Date and amount of last dose: 200mcg given on 03/13/24   Last Hgb: 8.2 PRBC's Given: No  ESA dose for discharge: mircera 225 mcg IV q 2 weeks  IV Iron dose at discharge: N/A  Heparin  change: No  EDW Change: Yes New EDW: 61kg  Bath Change: Use 2K and highest Ca bath (either 2.5Ca or 3Ca-whichever is available)  Access intervention/Change: No Details: Has a AVG  Hectorol  change: No  Discharge Labs: Calcium  7.1  Phosphorus 4.8  Albumin 3.1  K+ 3.8 Iron studies from 7/29 include: Fe 107, Tsat 54%, and ferritin 825   IV Antibiotics: No  On Coumadin?: No    D/C Meds to be reconciled by nurse after every discharge.  Completed By: Charmaine Piety, NP   Reviewed by: MD:______ RN_______

## 2024-03-15 NOTE — Progress Notes (Signed)
 D/C noted. Contacted [facility] to be advised of pt's d/c today and that pt should resume care on Saturday. Pt was provided phone number for [facility] to make transportation reservations. Out-pt HD clinic social worker submitted referral to [facility] prior to pt's admission.   Randine Mungo Dialysis Navigator [phone]

## 2024-03-15 NOTE — TOC Transition Note (Signed)
 Transition of Care [facility]) - Discharge Note   Patient Details  Name: [name] MRN: [id number] Date of Birth: May 22, 1971  Transition of [facility]) CM/SW Contact:  Corean JAYSON Canary, RN Phone Number: 03/15/2024, 9:12 AM   Clinical Narrative:     Patient missing dialysis due to transportation issues. Information placed on AVS regarding Medicaid transportation that can be set up for dialysis No further needs identified, patient will be discharged today  Final next level of care: Home/Self Care Barriers to Discharge: No Barriers Identified   Patient Goals and CMS Choice            Discharge Placement                       Discharge Plan and Services Additional resources added to the After Visit Summary for                                       Social Drivers of Health (SDOH) Interventions SDOH Screenings   Food Insecurity: No Food Insecurity (03/13/2024)  Housing: High Risk (03/13/2024)  Transportation Needs: Unmet Transportation Needs (03/13/2024)  Utilities: Not At Risk (03/13/2024)  Depression (PHQ2-9): Low Risk  (04/01/2020)  Financial Resource Strain: Medium Risk (01/27/2018)  Physical Activity: Insufficiently Active (01/27/2018)  Social Connections: Moderately Integrated (01/27/2018)  Stress: No Stress Concern Present (01/27/2018)  Tobacco Use: Low Risk  (03/12/2024)     Readmission Risk Interventions     No data to display

## 2024-03-15 NOTE — Progress Notes (Signed)
   03/15/24 0923  Mobility  Activity Dangled on edge of bed  Level of Assistance Independent  Assistive Device None  Distance Ambulated (ft)  (deffered by pt)  Activity Response Tolerated well  Mobility Referral Yes  Mobility visit 1 Mobility  Mobility Specialist Start Time (ACUTE ONLY) [id number]  Mobility Specialist Stop Time (ACUTE ONLY) [id number]  Mobility Specialist Time Calculation (min) (ACUTE ONLY) 5 min   Mobility Specialist: Progress Note  Post-Mobility:    HR 109, SpO2 100% RA  Pt agreeable to mobility session - received in bed.Pt was asymptomatic throughout session with no complaints. Pt with shaking/ tremor like movement in hands.  Returned to [facility] with all needs met - call bell within reach. Pt ready for d/c deferred further mobility d/t stating he ambulated already.  Virgle Boards, BS Mobility Specialist Please contact via SecureChat or  Rehab office at [phone].

## 2024-03-15 NOTE — Progress Notes (Signed)
 AVS and discharge instructions reviewed.  Patient verbally understands plan of care.  Tele remove telemetry unit called.  PIV removed dressings intact. Waiting for Ride and TOC meds.  Discharge lounge called for transport.

## 2024-03-15 NOTE — Discharge Summary (Signed)
 PATIENT DETAILS Name: [name] Age: 53 y.o. Sex: male Date of Birth: Mar 21, 1971 MRN: [id number]. Admitting Physician: Eva KATHEE Pore, DO ERE:[place], No Pcp Per  Admit Date: 03/12/2024 Discharge date: 03/15/2024  Recommendations for Outpatient Follow-up:  Follow up with PCP in 1-2 weeks Please obtain CMP/CBC in one week  Admitted From:  Home  Disposition: Home   Discharge Condition: good  CODE STATUS:   Code Status: Full Code   Diet recommendation:  Diet Order             Diet - low sodium heart healthy           Diet Carb Modified           Diet renal with fluid restriction Fluid restriction: 2000 mL Fluid; Room service appropriate? Yes; Fluid consistency: Thin  Diet effective now                    Brief Summary: Patient is a 53 y.o.  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   Significant events: 7/28>> admit to [facility]   Significant studies: 7/28>> CXR: Moderate left/small right pleural effusion.  Mild vascular congestion.   Significant microbiology data: None   Procedures: None   Consults: Nephrology  Brief Hospital Course: Volume overload with hyperkalemia/metabolic acidosis/hyperphosphatemia secondary to noncompliance with HD (history of failed renal transplantation) Underwent HD on 7/29 and then again on 7/30-volume status much better-electrolytes stable-Per nephrology-okay for discharge-he will resume his usual outpatient dialysis starting Saturday. Patient was noncompliant due to transportation issues-he was evaluated by renal navigator-and has now transportation arranged for him to go to dialysis. Discussed with Dr. Benay only minimally prolonged-similar to prior-okay to resume tacrolimus .    B/L pleural effusions Secondary to volume overload Should improve with further  HD Currently on room air and essentially asymptomatic   Normocytic anemia Secondary to ESRD-noncompliance ESA/iron per nephrology service   Thrombocytopenia Unclear etiology Chronic issue Follow CBC periodically-defer further hematological evaluation to the outpatient setting.   QTc prolongation Mild-at baseline.   HTN BP stable Continue amlodipine    DM-2 (A1c 7.5 on 6/20) CBG stable. Resume usual outpatient regimen/diet control on discharge.  Follow with PCP for further optimization.   Discharge Diagnoses:  Principal Problem:   Hyperkalemia Active Problems:   DM2 (diabetes mellitus, type 2) (HCC)   Prolonged Q-T interval on ECG   High anion gap metabolic acidosis   ESRD (end stage renal disease) (HCC)   Generalized weakness   Hypomagnesemia   Hypocalcemia   Elevated troponin   History of anemia due to chronic kidney disease   History of essential hypertension   Thrombocytopenia (HCC)   Discharge Instructions:  Activity:  As tolerated with Full fall precautions use Buday/cane & assistance as needed  Discharge Instructions     Diet - low sodium heart healthy   Complete by: As directed    Diet Carb Modified   Complete by: As directed    Discharge instructions   Complete by: As directed    Follow with Primary MD in 1-2 weeks  Resume outpatient hemodialysis starting Saturday.  Please get a complete blood count and chemistry panel checked by your Primary MD at your next visit, and again as instructed by your Primary MD.  Get Medicines reviewed and adjusted: Please take all your medications with you for your next visit with your Primary MD  Laboratory/radiological data: Please request  your Primary MD to go over all hospital tests and procedure/radiological results at the follow up, please ask your Primary MD to get all Hospital records sent to his/her office.  In some cases, they will be blood work, cultures and biopsy results pending at the time of your  discharge. Please request that your primary care M.D. follows up on these results.  Also Note the following: If you experience worsening of your admission symptoms, develop shortness of breath, life threatening emergency, suicidal or homicidal thoughts you must seek medical attention immediately by calling 911 or calling your MD immediately  if symptoms less severe.  You must read complete instructions/literature along with all the possible adverse reactions/side effects for all the Medicines you take and that have been prescribed to you. Take any new Medicines after you have completely understood and accpet all the possible adverse reactions/side effects.   Do not drive when taking Pain medications or sleeping medications (Benzodaizepines)  Do not take more than prescribed Pain, Sleep and Anxiety Medications. It is not advisable to combine anxiety,sleep and pain medications without talking with your primary care practitioner  Special Instructions: If you have smoked or chewed Tobacco  in the last 2 yrs please stop smoking, stop any regular Alcohol  and or any Recreational drug use.  Wear Seat belts while driving.  Please note: You were cared for by a hospitalist during your hospital stay. Once you are discharged, your primary care physician will handle any further medical issues. Please note that NO REFILLS for any discharge medications will be authorized once you are discharged, as it is imperative that you return to your primary care physician (or establish a relationship with a primary care physician if you do not have one) for your post hospital discharge needs so that they can reassess your need for medications and monitor your lab values.   Increase activity slowly   Complete by: As directed       Allergies as of 03/15/2024       Reactions   Lactose Intolerance (gi) Diarrhea   No whole milk!!   Other Other (See Comments)   Soy milk burns his tongue   Pollen Extract Other (See  Comments)   Itching, watery eyes and sneezing.   Tape Rash   PLEASE USE COBAN WRAP Pt can't have paper tape        Medication List     TAKE these medications    Accu-Chek Aviva Plus test strip Generic drug: glucose blood 1 each by Other route 4 (four) times daily. DX:E11.9   Accu-Chek FastClix Lancets Misc 1 each by In Vitro route 4 (four) times daily. DX:E11.9   acetaminophen  500 MG tablet Commonly known as: TYLENOL  Take 500 mg by mouth every 6 (six) hours as needed for mild pain (pain score 1-3).   amLODipine  10 MG tablet Commonly known as: NORVASC  Take 1 tablet (10 mg total) by mouth at bedtime.   calcium  acetate 667 MG capsule Commonly known as: PHOSLO  Take 2 capsules (1,334 mg total) by mouth 3 (three) times daily with meals.   calcium  carbonate 1500 (600 Ca) MG Tabs tablet Commonly known as: OSCAL Take 1,500 mg by mouth 2 (two) times daily with a meal.   dapsone  25 MG tablet Take 1 tablet (25 mg total) by mouth daily.   metoCLOPramide  5 MG tablet Commonly known as: REGLAN  Take 1 tablet (5 mg total) by mouth every 6 (six) hours as needed for nausea or vomiting.   omeprazole   20 MG capsule Commonly known as: PRILOSEC Take 1 capsule (20 mg total) by mouth daily.   tacrolimus  1 MG capsule Commonly known as: PROGRAF  Take 1 capsule (1 mg total) by mouth 2 (two) times daily.        Follow-up Information     [facility] medicaid transportation Follow up.   Why: Transportation services [employer] will be the transportation benefit provider for [employer]  members.  To schedule a ride, members can call [phone], 2 days before their appointment.  Visit [employer]'s [email or web address] for more information about being a transportation provider with [employer].     If you are a non-emergency transportation provider and interested in providing services to [facility] members, you can contact [employer] as  follows:   Email: [email or web address] Phone: [phone] Website: [email or web address]        Primary care MD. Schedule an appointment as soon as possible for a visit in 1 week(s).                 Allergies  Allergen Reactions   Lactose Intolerance (Gi) Diarrhea    No whole milk!!   Other Other (See Comments)    Soy milk burns his tongue   Pollen Extract Other (See Comments)    Itching, watery eyes and sneezing.   Tape Rash    PLEASE USE COBAN WRAP Pt can't have paper tape     Other Procedures/Studies: DG CHEST PORT 1 VIEW Result Date: 03/14/2024 CLINICAL DATA:  Pleural effusion. EXAM: PORTABLE CHEST 1 VIEW COMPARISON:  03/12/2024. FINDINGS: Low lung volume.  Mild diffuse vascular congestion. Re-demonstration of left retrocardiac airspace opacity obscuring the left hemidiaphragm, descending thoracic aorta and blunting the left lateral costophrenic angle, suggesting combination of left lung atelectasis and/or consolidation with pleural effusion. No significant interval change. Bilateral lung fields are otherwise clear. Right lateral costophrenic angle is clear. Stable cardio-mediastinal silhouette. Vascular stent noted overlying the left upper mediastinum, unchanged. No acute osseous abnormalities. The soft tissues are within normal limits. IMPRESSION: No significant interval change since the prior study. Persistent left retrocardiac opacity, as described above. Electronically Signed   By: Ree Molt M.D.   On: 03/14/2024 08:38   DG Chest 2 View Result Date: 03/12/2024 CLINICAL DATA:  Shortness of breath.  No dialysis for over a month. EXAM: CHEST - 2 VIEW COMPARISON:  01/25/2024 FINDINGS: Mild cardiac enlargement. Mild central vascular congestion. No edema. Small right and moderate left pleural effusions. Left lung base opacity likely representing compressive atelectasis. Vascular graft in the left upper mediastinum. Mediastinal  contours appear intact. No pneumothorax. IMPRESSION: 1. Cardiac enlargement with mild vascular congestion. 2. Moderate left and small right pleural effusions with basilar atelectasis or infiltration. Electronically Signed   By: Elsie Gravely M.D.   On: 03/12/2024 16:10     TODAY-DAY OF DISCHARGE:  Subjective:   [name] today has no headache,no chest abdominal pain,no new weakness tingling or numbness, feels much better wants to go home today.  Objective:   Blood pressure 130/78, pulse (!) 102, temperature 98.7 F (37.1 C), temperature source Oral, resp. rate 13, height 5' 5 (1.651 m), weight 61.6 kg, SpO2 96%.  Intake/Output Summary (Last 24 hours) at 03/15/2024 0906 Last data filed at 03/14/2024 1752 Gross per 24 hour  Intake --  Output 1000 ml  Net -1000 ml   Filed Weights   03/14/24 1500 03/14/24 1752 03/15/24 0402  Weight: 61.9 kg 60.4 kg 61.6 kg    Exam: Awake Alert,  Oriented *3, No new F.N deficits, Normal affect [place].AT,PERRAL Supple Neck,No JVD, No cervical lymphadenopathy appriciated.  Symmetrical Chest wall movement, Good air movement bilaterally, CTAB RRR,No Gallops,Rubs or new Murmurs, No Parasternal Heave +ve B.Sounds, Abd Soft, Non tender, No organomegaly appriciated, No rebound -guarding or rigidity. No Cyanosis, Clubbing or edema, No new Rash or bruise   PERTINENT RADIOLOGIC STUDIES: DG CHEST PORT 1 VIEW Result Date: 03/14/2024 CLINICAL DATA:  Pleural effusion. EXAM: PORTABLE CHEST 1 VIEW COMPARISON:  03/12/2024. FINDINGS: Low lung volume.  Mild diffuse vascular congestion. Re-demonstration of left retrocardiac airspace opacity obscuring the left hemidiaphragm, descending thoracic aorta and blunting the left lateral costophrenic angle, suggesting combination of left lung atelectasis and/or consolidation with pleural effusion. No significant interval change. Bilateral lung fields are otherwise clear. Right lateral costophrenic angle is clear. Stable  cardio-mediastinal silhouette. Vascular stent noted overlying the left upper mediastinum, unchanged. No acute osseous abnormalities. The soft tissues are within normal limits. IMPRESSION: No significant interval change since the prior study. Persistent left retrocardiac opacity, as described above. Electronically Signed   By: Ree Molt M.D.   On: 03/14/2024 08:38     PERTINENT LAB RESULTS: CBC: Recent Labs    03/14/24 0502 03/15/24 0548  WBC 6.7 6.7  HGB 7.5* 8.2*  HCT 22.3* 25.3*  PLT 50* 50*   CMET CMP     Component Value Date/Time   NA 136 03/15/2024 0548   NA 139 07/07/2020 0000   K 3.8 03/15/2024 0548   CL 99 03/15/2024 0548   CL 102 05/27/2016 0000   CO2 24 03/15/2024 0548   CO2 25 05/27/2016 0000   GLUCOSE 128 (H) 03/15/2024 0548   BUN 74 (H) 03/15/2024 0548   BUN 47 (A) 07/07/2020 0000   CREATININE 14.59 (H) 03/15/2024 0548   CREATININE 6.25 (H) 02/06/2018 1434   CALCIUM  7.1 (L) 03/15/2024 0548   CALCIUM  6.1 (LL) 06/09/2023 2026   PROT 7.0 03/13/2024 0521   PROT 6.3 05/27/2016 0000   ALBUMIN 3.1 (L) 03/15/2024 0548   ALBUMIN 3.7 05/27/2016 0000   AST 23 03/13/2024 0521   ALT 37 03/13/2024 0521   ALKPHOS 80 03/13/2024 0521   BILITOT 0.6 03/13/2024 0521   GFRNONAA 4 (L) 03/15/2024 0548   GFRNONAA 10 (L) 02/06/2018 1434    GFR Estimated Creatinine Clearance: 5 mL/min (A) (by C-G formula based on SCr of 14.59 mg/dL (H)). No results for input(s): LIPASE, AMYLASE in the last 72 hours. No results for input(s): CKTOTAL, CKMB, CKMBINDEX, TROPONINI in the last 72 hours. Invalid input(s): POCBNP No results for input(s): DDIMER in the last 72 hours. No results for input(s): HGBA1C in the last 72 hours. No results for input(s): CHOL, HDL, LDLCALC, TRIG, CHOLHDL, LDLDIRECT in the last 72 hours. No results for input(s): TSH, T4TOTAL, T3FREE, THYROIDAB in the last 72 hours.  Invalid input(s): FREET3 Recent Labs     03/13/24 0521  FERRITIN 825*  TIBC 199*  IRON 107   Coags: No results for input(s): INR in the last 72 hours.  Invalid input(s): PT Microbiology: No results found for this or any previous visit (from the past 240 hours).  FURTHER DISCHARGE INSTRUCTIONS:  Get Medicines reviewed and adjusted: Please take all your medications with you for your next visit with your Primary MD  Laboratory/radiological data: Please request your Primary MD to go over all hospital tests and procedure/radiological results at the follow up, please ask your Primary MD to get all Hospital records sent to his/her office.  In some cases, they will be blood work, cultures and biopsy results pending at the time of your discharge. Please request that your primary care M.D. goes through all the records of your hospital data and follows up on these results.  Also Note the following: If you experience worsening of your admission symptoms, develop shortness of breath, life threatening emergency, suicidal or homicidal thoughts you must seek medical attention immediately by calling 911 or calling your MD immediately  if symptoms less severe.  You must read complete instructions/literature along with all the possible adverse reactions/side effects for all the Medicines you take and that have been prescribed to you. Take any new Medicines after you have completely understood and accpet all the possible adverse reactions/side effects.   Do not drive when taking Pain medications or sleeping medications (Benzodaizepines)  Do not take more than prescribed Pain, Sleep and Anxiety Medications. It is not advisable to combine anxiety,sleep and pain medications without talking with your primary care practitioner  Special Instructions: If you have smoked or chewed Tobacco  in the last 2 yrs please stop smoking, stop any regular Alcohol  and or any Recreational drug use.  Wear Seat belts while driving.  Please note: You were  cared for by a hospitalist during your hospital stay. Once you are discharged, your primary care physician will handle any further medical issues. Please note that NO REFILLS for any discharge medications will be authorized once you are discharged, as it is imperative that you return to your primary care physician (or establish a relationship with a primary care physician if you do not have one) for your post hospital discharge needs so that they can reassess your need for medications and monitor your lab values.  Total Time spent coordinating discharge including counseling, education and face to face time equals greater than 30 minutes.  SignedBETHA Donalda Applebaum 03/15/2024 9:06 AM

## 2024-03-16 ENCOUNTER — Telehealth (HOSPITAL_COMMUNITY): Payer: Self-pay | Admitting: Nephrology

## 2024-03-16 NOTE — Telephone Encounter (Signed)
 Transition of Care - Initial Contact after Hospitalization  Date of discharge: 03/15/24 Date of contact: 03/16/24  Method: Phone Spoke to: Patient  Patient contacted to discuss transition of care from recent inpatient hospitalization. Patient was admitted to [facility] from 7/28-7/31/25 with volume overload, B effusions, hypocalcemia in the setting of missed HD.  The discharge medication list was reviewed. He picked up the new meds - no questions.  Patient will return to his/her outpatient HD unit tomorrow - he reports that he called [employer] [place] and arranged his ride.  No other concerns at this time.  Izetta Boehringer, PA-C [employer] Pager [phone]

## 2024-04-11 ENCOUNTER — Encounter (HOSPITAL_COMMUNITY): Payer: Self-pay

## 2024-04-12 ENCOUNTER — Ambulatory Visit (HOSPITAL_BASED_OUTPATIENT_CLINIC_OR_DEPARTMENT_OTHER): Admitting: Family Medicine

## 2024-04-24 ENCOUNTER — Other Ambulatory Visit (HOSPITAL_COMMUNITY): Payer: Self-pay

## 2024-04-25 ENCOUNTER — Other Ambulatory Visit (HOSPITAL_COMMUNITY): Payer: Self-pay

## 2024-04-25 ENCOUNTER — Encounter: Payer: Self-pay | Admitting: Pharmacist

## 2024-04-25 ENCOUNTER — Other Ambulatory Visit: Payer: Self-pay

## 2024-04-26 ENCOUNTER — Other Ambulatory Visit: Payer: Self-pay

## 2024-04-27 ENCOUNTER — Other Ambulatory Visit (HOSPITAL_COMMUNITY): Payer: Self-pay

## 2024-04-30 ENCOUNTER — Other Ambulatory Visit (HOSPITAL_COMMUNITY): Payer: Self-pay

## 2024-04-30 ENCOUNTER — Other Ambulatory Visit: Payer: Self-pay

## 2024-05-01 ENCOUNTER — Other Ambulatory Visit: Payer: Self-pay | Admitting: Registered Nurse

## 2024-05-01 ENCOUNTER — Ambulatory Visit
Admission: RE | Admit: 2024-05-01 | Discharge: 2024-05-01 | Disposition: A | Discharge status: Home / Self Care | Source: Ambulatory Visit | Attending: Registered Nurse | Admitting: Registered Nurse

## 2024-05-01 DIAGNOSIS — R0602 Shortness of breath: Secondary | ICD-10-CM

## 2024-05-07 ENCOUNTER — Ambulatory Visit: Admitting: Podiatry

## 2024-06-18 ENCOUNTER — Encounter: Payer: Self-pay | Admitting: Radiology

## 2024-06-29 ENCOUNTER — Other Ambulatory Visit: Payer: Self-pay

## 2024-06-29 ENCOUNTER — Other Ambulatory Visit (HOSPITAL_COMMUNITY): Payer: Self-pay

## 2024-07-04 ENCOUNTER — Other Ambulatory Visit (HOSPITAL_COMMUNITY): Payer: Self-pay

## 2024-07-04 ENCOUNTER — Other Ambulatory Visit: Payer: Self-pay

## 2024-07-05 ENCOUNTER — Other Ambulatory Visit (HOSPITAL_COMMUNITY): Payer: Self-pay

## 2024-08-07 ENCOUNTER — Encounter (HOSPITAL_COMMUNITY): Payer: Self-pay | Admitting: Vascular Surgery

## 2024-08-10 ENCOUNTER — Encounter (HOSPITAL_COMMUNITY): Payer: Self-pay

## 2024-08-17 ENCOUNTER — Encounter (HOSPITAL_COMMUNITY): Admission: RE | Payer: Self-pay | Source: Home / Self Care

## 2024-08-17 ENCOUNTER — Ambulatory Visit (HOSPITAL_COMMUNITY): Admission: RE | Admit: 2024-08-17 | Admitting: Vascular Surgery

## 2024-08-17 SURGERY — A/V FISTULAGRAM
Anesthesia: LOCAL | Site: Arm Upper | Laterality: Left

## 2024-08-29 ENCOUNTER — Ambulatory Visit: Payer: Self-pay

## 2024-08-29 NOTE — Telephone Encounter (Signed)
 FYI Only or Action Required?: Action required by provider: request for appointment.  Patient is followed in Pulmonology for N/A, last seen on N/A.  Called Nurse Triage reporting Cough.  Symptoms began several months ago.  Interventions attempted: OTC medications: cough syrup.  Symptoms are: gradually worsening.  Triage Disposition: See HCP Within 4 Hours (Or PCP Triage)  Patient/caregiver understands and will follow disposition?: Unsure              Copied from CRM [id number]. Topic: Clinical - Red Word Triage >> Aug 29, 2024  3:39 PM [place] KIDD wrote: [facility] that prompted transfer to Nurse Triage:  Patient has this horrible severe cough that has not gone away . I can hear him coughing real bad in the back ground . Patient wife use to see dr young and now has a appointment with dr stretch . [place] Reason for Disposition  [1] MILD difficulty breathing (e.g., minimal/no SOB at rest, SOB with walking, pulse < 100) AND [2] still present when not coughing  Answer Assessment - Initial Assessment Questions Patient goes to [facility] ---patient has been treated for bronchitis --pt finished antibiotics over the holidays --patient got slightly better and PCP not in the office --Patient was on an Albuterol  inhaler but the side effects caused the patient to swell bad--patient is on dialysis  (It was only 2 puffs in the morning and 2 puffs at night) Patient had to stop using Albuterol . Patient has been seen with nephrologist recently.  Patient has been coughing for months---Patient has been using over the counter cough syrup Patient has been getting short of breath on exertion. Wife states patient snores as well and she is concerned for sleep apnea. Dialysis is Tuesdays/Thursdays/Saturdays to the [facility] on [address] in [place].  Patient and his wife are advised that with the patient having this cough with shortness of breath worse on exertion---it is  recommended that he be seen in the next four hours---patient and his wife are advised that at this point Urgent Care is recommended for further evaluation of current symptoms---and patient and his wife are transferred to [facility] for scheduling as a New Patient with Pulmonary.  Patient and his wife are aware that being evaluated in the next four hours is recommended and we will get them scheduled with Pulmonology for continuation of care.  Patient warm transferred back to Pulmonology  Patient and his wife are advised to call us  back if anything changes or with any further questions/concerns. They are advised that if anything worsens to go to the Emergency Room. They verbalized understanding.  Protocols used: Cough - Acute Non-Productive-A-AH

## 2024-09-12 ENCOUNTER — Ambulatory Visit

## 2024-10-01 ENCOUNTER — Ambulatory Visit: Admitting: Pulmonary Disease
